# Patient Record
Sex: Male | Born: 1940 | Race: White | Hispanic: No | Marital: Married | Smoking: Never smoker
Health system: Southern US, Community
[De-identification: ages and names within clinical notes are randomized; demographics above are authoritative.]

## PROBLEM LIST (undated history)

## (undated) DIAGNOSIS — K589 Irritable bowel syndrome without diarrhea: Secondary | ICD-10-CM

## (undated) DIAGNOSIS — J8410 Pulmonary fibrosis, unspecified: Secondary | ICD-10-CM

## (undated) DIAGNOSIS — K219 Gastro-esophageal reflux disease without esophagitis: Secondary | ICD-10-CM

## (undated) DIAGNOSIS — M1990 Unspecified osteoarthritis, unspecified site: Secondary | ICD-10-CM

## (undated) DIAGNOSIS — K2960 Other gastritis without bleeding: Secondary | ICD-10-CM

## (undated) DIAGNOSIS — K449 Diaphragmatic hernia without obstruction or gangrene: Secondary | ICD-10-CM

## (undated) DIAGNOSIS — K5730 Diverticulosis of large intestine without perforation or abscess without bleeding: Secondary | ICD-10-CM

## (undated) DIAGNOSIS — K648 Other hemorrhoids: Secondary | ICD-10-CM

## (undated) DIAGNOSIS — F32A Depression, unspecified: Secondary | ICD-10-CM

## (undated) DIAGNOSIS — E785 Hyperlipidemia, unspecified: Secondary | ICD-10-CM

## (undated) DIAGNOSIS — C61 Malignant neoplasm of prostate: Secondary | ICD-10-CM

## (undated) HISTORY — DX: Other hemorrhoids: K64.8

## (undated) HISTORY — DX: Diverticulosis of large intestine without perforation or abscess without bleeding: K57.30

## (undated) HISTORY — DX: Hyperlipidemia, unspecified: E78.5

## (undated) HISTORY — PX: CARPAL TUNNEL RELEASE: SHX101

## (undated) HISTORY — DX: Diaphragmatic hernia without obstruction or gangrene: K44.9

## (undated) HISTORY — PX: NASAL SINUS SURGERY: SHX719

## (undated) HISTORY — DX: Gastro-esophageal reflux disease without esophagitis: K21.9

## (undated) HISTORY — DX: Other gastritis without bleeding: K29.60

## (undated) HISTORY — DX: Irritable bowel syndrome, unspecified: K58.9

## (undated) HISTORY — PX: LUMBAR DISC SURGERY: SHX700

---

## 1998-12-27 ENCOUNTER — Ambulatory Visit (HOSPITAL_BASED_OUTPATIENT_CLINIC_OR_DEPARTMENT_OTHER): Admission: RE | Admit: 1998-12-27 | Discharge: 1998-12-27 | Payer: Self-pay | Admitting: *Deleted

## 2000-05-12 ENCOUNTER — Encounter: Payer: Self-pay | Admitting: *Deleted

## 2000-05-12 ENCOUNTER — Encounter: Admission: RE | Admit: 2000-05-12 | Discharge: 2000-05-12 | Payer: Self-pay | Admitting: *Deleted

## 2003-11-30 ENCOUNTER — Ambulatory Visit (HOSPITAL_COMMUNITY): Admission: RE | Admit: 2003-11-30 | Discharge: 2003-11-30 | Payer: Self-pay | Admitting: Gastroenterology

## 2004-05-14 ENCOUNTER — Encounter: Admission: RE | Admit: 2004-05-14 | Discharge: 2004-05-14 | Payer: Self-pay | Admitting: Radiology

## 2004-06-19 ENCOUNTER — Ambulatory Visit (HOSPITAL_BASED_OUTPATIENT_CLINIC_OR_DEPARTMENT_OTHER): Admission: RE | Admit: 2004-06-19 | Discharge: 2004-06-19 | Payer: Self-pay | Admitting: Orthopedic Surgery

## 2008-03-15 ENCOUNTER — Encounter: Admission: RE | Admit: 2008-03-15 | Discharge: 2008-03-15 | Payer: Self-pay | Admitting: Neurological Surgery

## 2008-04-10 ENCOUNTER — Encounter: Admission: RE | Admit: 2008-04-10 | Discharge: 2008-04-10 | Payer: Self-pay | Admitting: Neurosurgery

## 2008-05-11 ENCOUNTER — Encounter: Admission: RE | Admit: 2008-05-11 | Discharge: 2008-05-11 | Payer: Self-pay | Admitting: Neurosurgery

## 2008-08-14 ENCOUNTER — Encounter: Admission: RE | Admit: 2008-08-14 | Discharge: 2008-08-14 | Payer: Self-pay | Admitting: Neurosurgery

## 2008-11-06 ENCOUNTER — Encounter: Admission: RE | Admit: 2008-11-06 | Discharge: 2008-11-06 | Payer: Self-pay | Admitting: Neurosurgery

## 2008-12-28 ENCOUNTER — Ambulatory Visit (HOSPITAL_COMMUNITY): Admission: RE | Admit: 2008-12-28 | Discharge: 2008-12-28 | Payer: Self-pay | Admitting: Orthopedic Surgery

## 2009-02-13 ENCOUNTER — Encounter: Admission: RE | Admit: 2009-02-13 | Discharge: 2009-02-13 | Payer: Self-pay | Admitting: Neurosurgery

## 2009-06-13 ENCOUNTER — Encounter: Admission: RE | Admit: 2009-06-13 | Discharge: 2009-06-13 | Payer: Self-pay | Admitting: Neurosurgery

## 2010-01-08 ENCOUNTER — Ambulatory Visit (HOSPITAL_COMMUNITY): Admission: RE | Admit: 2010-01-08 | Discharge: 2010-01-08 | Payer: Self-pay | Admitting: Internal Medicine

## 2010-01-10 ENCOUNTER — Encounter: Admission: RE | Admit: 2010-01-10 | Discharge: 2010-01-10 | Payer: Self-pay | Admitting: Neurosurgery

## 2010-06-12 ENCOUNTER — Encounter
Admission: RE | Admit: 2010-06-12 | Discharge: 2010-06-12 | Payer: Self-pay | Source: Home / Self Care | Attending: Orthopedic Surgery | Admitting: Orthopedic Surgery

## 2010-07-27 ENCOUNTER — Encounter: Payer: Self-pay | Admitting: Radiology

## 2010-11-21 NOTE — Op Note (Signed)
NAMEDERAK, SCHURMAN                  ACCOUNT NO.:  [id number]   MEDICAL RECORD NO.:  [id number]          PATIENT TYPE:  AMB   LOCATION:  [facility]                         FACILITY:  [facility]   PHYSICIAN:  Pietro Cassis. Alvan Dame, M.D.  DATE OF BIRTH:  05-29-1941   DATE OF PROCEDURE:  06/19/2004  DATE OF DISCHARGE:                                 OPERATIVE REPORT   PREOPERATIVE DIAGNOSIS:  Right knee medial meniscal tear, posterior horn.   POSTOPERATIVE DIAGNOSES/FINDINGS:  1.  Intact patellofemoral compartment with some mild chondromalacia noted on      the proximal aspect of the medial patellar facet.  2.  Hypertrophic and abundant inflamed synovitis present throughout the knee      as a result of his injury, otherwise intact patellofemoral contact zone.  3.  Intact anterior cruciate ligament, intact lateral compartment.  4.  Intact medial femoral and tibial cartilage surfaces with no significant      chondromalacia noted.  5.  Most significant complex radial type tear to the posterior horn of the      medial meniscus involving basically only the posterior horn.  There was      radial tear truncating the posterior horn of the medial meniscus with a      flap tear that extruded medially into the central portion of the joint.   PROCEDURE:  Right knee diagnostic and operative arthroscopy with posterior  horn and partial medial meniscectomy, removing about 10% of that meniscus,  which would include the posterior horn with a stable rim remaining.   SURGEON:  Pietro Cassis. Alvan Dame, M.D.   ASSISTANT:  None.   ANESTHESIA:  Local plus MAC.   COMPLICATIONS:  None apparent.   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  He had mechanical-type symptoms that were  associated with medial joint line tenderness.  An MRI had been performed  followed by a cortisone injection in the  right knee.  The MRI revealed this  posterior horn medial meniscal tear associated with very minimal  osteoarthritis.  He had a cortisone injection which failed to provide  adequate long-term relief.   After reviewing these findings with him, including a response to  conservative measures, we discussed management, including nonoperative  measures versus surgically debriding this posterior horn of the meniscus to  prevent further complications and propagation of a tear.  After reviewing  risks and benefits, he consents for the procedure.   PROCEDURE IN DETAIL:  Patient was brought to the operating theater.  Once  adequate anesthesia and preoperative antibiotics were administered, the  patient was positioned supine with the right leg in a leg holder.  The right  lower extremity was then prepped and draped in a sterile fashion.  A  standard inferolateral, superolateral, and inferior medial portals were  utilized.  Diagnostic evaluation of the knee was carried out, revealing  hypertrophic and hyperemic synovitis throughout the knee.  The proximal  aspect of the  medial patellar facet was noted to have some chondromalacia  but not terribly significant, nothing that required debridement.  There were  no loose fragments or cartilage floating in the knee.  Further examination  of the knee revealed the above-noted findings but otherwise a very well-  appearing knee.  The inferior medial portal was utilized as a working  portal.  With exposure to the posterior horn of the meniscus, a biting small  basket was utilized to debride the flap tear portion.  There was a radial  type of tear that basically isolated this portion of the posterior horn from  the remaining meniscus.  There was a slight communication of the  continuation of that radial tear, which was debrided using a biting basket.  Only approximately about 10-15% of the meniscus was removed off the  posterior horn of the meniscus, leaving the  posterior horn with a stable rim  that then blended in nicely with the remaining medial meniscus, posterior  horn, mid body, etc.  I did enter the back of the knee through the medial  aspect of the notch to allow for visualization of this posterior horn tear.  This did allow great visualization of this flap tear and allowed for further  debridement using the biting baskets followed by the shaver.  Following the  debridement, the knee was examined, showing there were no further loose  fragments of cartilage floating around.  The knee was then irrigated and  copiously was suctioned and drained with a 3.5 shaver.  Instrumentation was  removed.  Portals were reapproximated using 3-0 nylon.  The knee was then  cleaned, dried, and dressed sterilely with a bulky Jones dressing.  The  patient was transferred to the recovery room in stable condition.   The postoperative plan would call for the patient to return in 10 days.  He  will be weightbearing as tolerated, although with instructions to modify  activities for a while.  Medications as prescribed.  Knee range of motion,  activities, and quad sets will be permitted once pain has improved.     Matt   MDO/MEDQ  D:  06/19/2004  T:  06/19/2004  Job:  [id number]

## 2010-11-27 ENCOUNTER — Other Ambulatory Visit: Payer: Self-pay

## 2010-11-27 ENCOUNTER — Other Ambulatory Visit: Payer: Self-pay | Admitting: Orthopedic Surgery

## 2010-11-27 DIAGNOSIS — G8929 Other chronic pain: Secondary | ICD-10-CM

## 2010-11-28 ENCOUNTER — Other Ambulatory Visit: Payer: Self-pay | Admitting: Orthopedic Surgery

## 2010-11-28 ENCOUNTER — Ambulatory Visit
Admission: RE | Admit: 2010-11-28 | Discharge: 2010-11-28 | Disposition: A | Discharge status: Home / Self Care | Payer: Medicare Other | Source: Ambulatory Visit | Attending: Orthopedic Surgery | Admitting: Orthopedic Surgery

## 2010-11-28 DIAGNOSIS — G8929 Other chronic pain: Secondary | ICD-10-CM

## 2010-11-28 DIAGNOSIS — M533 Sacrococcygeal disorders, not elsewhere classified: Secondary | ICD-10-CM

## 2011-04-08 ENCOUNTER — Other Ambulatory Visit (HOSPITAL_COMMUNITY): Payer: Self-pay | Admitting: Internal Medicine

## 2011-04-08 ENCOUNTER — Ambulatory Visit (HOSPITAL_COMMUNITY)
Admission: RE | Admit: 2011-04-08 | Discharge: 2011-04-08 | Disposition: A | Discharge status: Home / Self Care | Payer: Medicare Other | Source: Ambulatory Visit | Attending: Internal Medicine | Admitting: Internal Medicine

## 2011-04-08 DIAGNOSIS — Z0000 Encounter for general adult medical examination without abnormal findings: Secondary | ICD-10-CM

## 2011-05-05 ENCOUNTER — Ambulatory Visit (HOSPITAL_BASED_OUTPATIENT_CLINIC_OR_DEPARTMENT_OTHER)
Admission: RE | Admit: 2011-05-05 | Discharge: 2011-05-05 | Disposition: A | Discharge status: Home / Self Care | Payer: Medicare Other | Source: Ambulatory Visit | Attending: Orthopedic Surgery | Admitting: Orthopedic Surgery

## 2011-05-05 DIAGNOSIS — G5600 Carpal tunnel syndrome, unspecified upper limb: Secondary | ICD-10-CM | POA: Insufficient documentation

## 2011-05-05 DIAGNOSIS — M65839 Other synovitis and tenosynovitis, unspecified forearm: Secondary | ICD-10-CM | POA: Insufficient documentation

## 2011-05-05 DIAGNOSIS — M65849 Other synovitis and tenosynovitis, unspecified hand: Secondary | ICD-10-CM | POA: Insufficient documentation

## 2011-05-06 LAB — POCT HEMOGLOBIN-HEMACUE: Hemoglobin: 12.7 g/dL — ABNORMAL LOW (ref 13.0–17.0)

## 2011-05-08 NOTE — Op Note (Addendum)
NAMELARENZO, CAPLES NO.:  [id number]  MEDICAL RECORD NO.:  [id number]  LOCATION:  XRAY                         FACILITY:  [facility]  PHYSICIAN:  Daryll Brod, M.D.       DATE OF BIRTH:  1941/07/02  DATE OF PROCEDURE:  05/05/2011 DATE OF DISCHARGE:  04/08/2011                              OPERATIVE REPORT   PREOPERATIVE DIAGNOSES:  Carpal tunnel syndrome, left hand.  Stenosing tenosynovitis, left ring finger.  POSTOPERATIVE DIAGNOSES:  Carpal tunnel syndrome, left hand. Stenosing tenosynovitis, left ring finger.  OPERATION:  Release A1 pulley, left ring finger.  Release of median nerve, left wrist.  SURGEON:  Daryll Brod, M.D.  ASSISTANT:  Leanora Cover, MD  ANESTHESIA:  Forearm-based IV regional with local infiltration.  ANESTHESIOLOGIST:  Jessy Oto. Albertina Parr, M.D.  HISTORY:  The patient is a 70 year old male who has a history of numbness and tingling of his hand, triggering of his left ring finger. EMG nerve conductions are positive for carpal tunnel syndrome.  This has not responded to conservative treatment.  He has elected to undergo surgical decompression.  Pre, peri, postoperative course has been discussed along with risks and complications.  He is aware that there is no guarantee with the surgery, possibility of infection, recurrence, injury to arteries, nerves, tendons, incomplete relief of symptoms, and dystrophy.  In the preoperative area, the patient is seen, the extremity marked by both the patient and surgeon.  Antibiotic given.  PROCEDURE:  The patient was brought to the operating room where forearm- based IV regional anesthetic was carried out without difficulty.  He was prepped using ChloraPrep, supine position, left arm free.  A 3-minute dry time was allowed, time-out taken, confirming the patient and procedure.  A longitudinal incision was made in the palm, carried down through subcutaneous tissue.  Bleeders were electrocauterized  with bipolar.  Dissection carried down identifying flexor tendon to the ring little finger.  The superficial palmar arch identified.  Retractors placed retracting the flexors to the radial side.  The nerve and artery to the ulnar side, and incision made through the carpal retinaculum. Right angle and Sewall retractor were placed between skin and forearm fascia.  The fascia released for approximately a centimeter and half proximal to the wrist crease under direct vision.  Air compression to the nerve was apparent.  No further lesions were identified. Tenosynovial tissue was moderately thickened.  The wound was irrigated and closed with interrupted 4-0 Vicryl Rapide sutures.  Separate incision was then made obliquely over the A1 pulley left ring finger carried down through subcutaneous tissue.  Bleeders again electrocauterized with bipolar.  The A1 pulley was identified.  This was released on its radial aspect to the partial tenosynovectomy proximally. A small incision made centrally in A2.  The finger placed through full range of motion, no further triggering was noted.  The wound was irrigated and closed with interrupted 4-0 Vicryl Rapide sutures.  A local infiltration with 0.25% Marcaine without epinephrine was given to each wound approximately 8 mL was used.  Sterile compressive dressing was applied to the wrist with the fingers free.  On deflation of the tourniquet, all fingers immediately  pinked.  He was taken to the recovery room for observation in satisfactory condition.  He will be discharged home to return the [place] in 1 week on Vicodin.          ______________________________ Daryll Brod, M.D.     GK/MEDQ  D:  05/05/2011  T:  05/05/2011  Job:  [id number]  Electronically Signed by Daryll Brod M.D. on 05/10/2011 07:27:11 AM

## 2011-05-22 ENCOUNTER — Telehealth: Payer: Self-pay | Admitting: *Deleted

## 2011-05-22 NOTE — Telephone Encounter (Signed)
Schedule pt for a Direct Colon. Spoke with pt who asked that I call him next week to schedule- will call Tuesday.

## 2011-06-03 NOTE — Telephone Encounter (Signed)
Dr Annamaria Boots will call me back to schedule his Direct COLON.

## 2011-06-06 HISTORY — PX: CATARACT EXTRACTION: SUR2

## 2011-06-17 ENCOUNTER — Telehealth: Payer: Self-pay | Admitting: *Deleted

## 2011-06-17 NOTE — Telephone Encounter (Signed)
Pt never called back to schedule.

## 2011-07-10 ENCOUNTER — Other Ambulatory Visit: Payer: Self-pay | Admitting: Orthopedic Surgery

## 2011-07-10 DIAGNOSIS — M549 Dorsalgia, unspecified: Secondary | ICD-10-CM

## 2011-07-13 ENCOUNTER — Other Ambulatory Visit: Payer: Self-pay | Admitting: Orthopedic Surgery

## 2011-07-13 ENCOUNTER — Ambulatory Visit
Admission: RE | Admit: 2011-07-13 | Discharge: 2011-07-13 | Disposition: A | Discharge status: Home / Self Care | Payer: Medicare Other | Source: Ambulatory Visit | Attending: Orthopedic Surgery | Admitting: Orthopedic Surgery

## 2011-07-13 DIAGNOSIS — M542 Cervicalgia: Secondary | ICD-10-CM

## 2011-07-13 DIAGNOSIS — M549 Dorsalgia, unspecified: Secondary | ICD-10-CM

## 2011-07-13 MED ORDER — IOHEXOL 180 MG/ML  SOLN
1.0000 mL | Freq: Once | INTRAMUSCULAR | Status: AC | PRN
  Administered 2011-07-13: 1 mL via EPIDURAL

## 2011-07-13 MED ORDER — METHYLPREDNISOLONE ACETATE 40 MG/ML INJ SUSP (RADIOLOG
120.0000 mg | Freq: Once | INTRAMUSCULAR | Status: AC
  Administered 2011-07-13: 120 mg via EPIDURAL

## 2011-12-07 ENCOUNTER — Encounter: Payer: Self-pay | Admitting: *Deleted

## 2011-12-07 ENCOUNTER — Telehealth: Payer: Self-pay | Admitting: Gastroenterology

## 2011-12-07 NOTE — Telephone Encounter (Signed)
Pt would like to schedule a COLON, but he also wants to speak with Dr Sharlett Iles. I tried to find an appt for next week, but Dr Sharlett Iles is out of town next week. Left a note for Dr Sharlett Iles to call pt.

## 2011-12-08 ENCOUNTER — Encounter: Payer: Self-pay | Admitting: Gastroenterology

## 2011-12-08 ENCOUNTER — Other Ambulatory Visit (INDEPENDENT_AMBULATORY_CARE_PROVIDER_SITE_OTHER): Payer: Medicare Other

## 2011-12-08 ENCOUNTER — Ambulatory Visit (INDEPENDENT_AMBULATORY_CARE_PROVIDER_SITE_OTHER): Payer: Medicare Other | Admitting: Gastroenterology

## 2011-12-08 VITALS — BP 110/60 | HR 80 | Ht 71.0 in | Wt 185.0 lb

## 2011-12-08 DIAGNOSIS — F418 Other specified anxiety disorders: Secondary | ICD-10-CM

## 2011-12-08 DIAGNOSIS — Z79899 Other long term (current) drug therapy: Secondary | ICD-10-CM

## 2011-12-08 DIAGNOSIS — K219 Gastro-esophageal reflux disease without esophagitis: Secondary | ICD-10-CM

## 2011-12-08 DIAGNOSIS — K529 Noninfective gastroenteritis and colitis, unspecified: Secondary | ICD-10-CM

## 2011-12-08 DIAGNOSIS — R197 Diarrhea, unspecified: Secondary | ICD-10-CM

## 2011-12-08 DIAGNOSIS — K589 Irritable bowel syndrome without diarrhea: Secondary | ICD-10-CM

## 2011-12-08 DIAGNOSIS — F341 Dysthymic disorder: Secondary | ICD-10-CM

## 2011-12-08 DIAGNOSIS — M533 Sacrococcygeal disorders, not elsewhere classified: Secondary | ICD-10-CM

## 2011-12-08 LAB — CBC WITH DIFFERENTIAL/PLATELET
Basophils Absolute: 0 10*3/uL (ref 0.0–0.1)
Basophils Relative: 0.2 % (ref 0.0–3.0)
Eosinophils Absolute: 0.1 10*3/uL (ref 0.0–0.7)
Eosinophils Relative: 2.4 % (ref 0.0–5.0)
HCT: 37.5 % — ABNORMAL LOW (ref 39.0–52.0)
Hemoglobin: 12.8 g/dL — ABNORMAL LOW (ref 13.0–17.0)
Lymphocytes Relative: 21.7 % (ref 12.0–46.0)
Lymphs Abs: 1.1 10*3/uL (ref 0.7–4.0)
MCHC: 34.2 g/dL (ref 30.0–36.0)
MCV: 94.7 fl (ref 78.0–100.0)
Monocytes Absolute: 0.5 10*3/uL (ref 0.1–1.0)
Monocytes Relative: 9.6 % (ref 3.0–12.0)
Neutro Abs: 3.4 10*3/uL (ref 1.4–7.7)
Neutrophils Relative %: 66.1 % (ref 43.0–77.0)
Platelets: 164 10*3/uL (ref 150.0–400.0)
RBC: 3.97 Mil/uL — ABNORMAL LOW (ref 4.22–5.81)
RDW: 12.6 % (ref 11.5–14.6)
WBC: 5.1 10*3/uL (ref 4.5–10.5)

## 2011-12-08 LAB — HEPATIC FUNCTION PANEL
ALT: 22 U/L (ref 0–53)
AST: 26 U/L (ref 0–37)
Albumin: 4.3 g/dL (ref 3.5–5.2)
Alkaline Phosphatase: 48 U/L (ref 39–117)
Bilirubin, Direct: 0.1 mg/dL (ref 0.0–0.3)
Total Bilirubin: 0.8 mg/dL (ref 0.3–1.2)
Total Protein: 7.1 g/dL (ref 6.0–8.3)

## 2011-12-08 LAB — BASIC METABOLIC PANEL
BUN: 15 mg/dL (ref 6–23)
CO2: 28 mEq/L (ref 19–32)
Calcium: 8.8 mg/dL (ref 8.4–10.5)
Chloride: 101 mEq/L (ref 96–112)
Creatinine, Ser: 0.6 mg/dL (ref 0.4–1.5)
GFR: 131.37 mL/min (ref >60.00)
Glucose, Bld: 76 mg/dL (ref 70–99)
Potassium: 3.9 mEq/L (ref 3.5–5.1)
Sodium: 137 mEq/L (ref 135–145)

## 2011-12-08 LAB — SEDIMENTATION RATE: Sed Rate: 7 mm/hr (ref 0–22)

## 2011-12-08 LAB — IBC PANEL
Iron: 143 ug/dL (ref 42–165)
Saturation Ratios: 39.3 % (ref 20.0–50.0)
Transferrin: 259.7 mg/dL (ref 212.0–360.0)

## 2011-12-08 LAB — AMYLASE: Amylase: 56 U/L (ref 27–131)

## 2011-12-08 LAB — LIPASE: Lipase: 19 U/L (ref 11.0–59.0)

## 2011-12-08 LAB — HIGH SENSITIVITY CRP: CRP, High Sensitivity: 0.87 mg/dL (ref 0.000–5.000)

## 2011-12-08 MED ORDER — MESALAMINE 1.2 G PO TBEC: DELAYED_RELEASE_TABLET | ORAL | Status: DC

## 2011-12-08 MED ORDER — CILIDINIUM-CHLORDIAZEPOXIDE 2.5-5 MG PO CAPS: 1.0000 | ORAL_CAPSULE | Freq: Two times a day (BID) | ORAL | Status: DC | PRN

## 2011-12-08 MED ORDER — MOVIPREP 100 G PO SOLR: 1.0000 | Freq: Once | ORAL | Status: DC

## 2011-12-08 MED ORDER — CITALOPRAM HYDROBROMIDE 20 MG PO TABS: 20.0000 mg | ORAL_TABLET | Freq: Every day | ORAL | Status: DC

## 2011-12-08 NOTE — Patient Instructions (Addendum)
Your procedure has been scheduled for 12/28/2011, please follow the seperate instructions.  Your prescription(s) have been sent to you pharmacy, Librax, Movi Prep, Celexa Take Librax as needed Take Celexa one tablet by mouth at bedtime.  Take Lialda two tablets by mouth every morning, samples given  Please go to the basement today for your labs.

## 2011-12-08 NOTE — Progress Notes (Signed)
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 This patient has been in fairly good health all of his life without serious medical or surgical issues. He gives a history of many years of irritable bowel syndrome type abdominal discomfort, gas, bloating, and chronic nonbloody diarrhea. He had a negative colonoscopy in 2003 except for internal hemorrhoids. Endoscopy in 2005 by Dr.[place] was unremarkable including exam for H. pylori. The patient has had chronic acid reflux and has taken Nexium 40 mg for several years. Currently, he denies upper gastrointestinal or hepatobiliary complaints. His diarrhea seems to be stress related, and he recently has had increased anxiety and depression which he seems to internalize. He denies any specific food intolerances, use of sorbitol or fructose. He does have chronic sinusitis and takes frequent antibiotics, but also takes probiotics. His family history is remarkable for chronic diarrhea in his father without a definite diagnosis. The patient also complains of chronic sacroiliitis and musculoskeletal pains in his legs relieved by small doses of soma, Robaxin, and Anaprox. He uses when necessary Lomotil when traveling or during periods of excessive eating. He has no symptoms of malabsorption, unintended weight loss, fatty stools, or nocturnal awakening. His depression is minor, and he denies sleep disturbances or any other psychiatric issues. He is concerned about possible" addiction" to soma. Dr. Annamaria Boots has sacroiliac steroid injection every several months per radiology. Other systemic complaints include night sweats and fatigue. He denies dysphagia, hepatobiliary complaints, or history of hepatitis or pancreatitis. He uses ethanol socially, has no history of excessive alcohol use, and does not smoke.  I have reviewed this patient's present history, medical and  surgical past history, allergies and medications.     ROS: The remainder of the 10 point ROS is negative... history of lumbosacral disc surgery 1986 in 1995. No current cardiovascular, pulmonary, other genitourinary or neurological problems.  No Known Allergies No outpatient prescriptions prior to visit.   Past Medical History  Diagnosis Date  . Other specified gastritis without mention of hemorrhage   . Hiatal hernia   . Internal hemorrhoids without mention of complication   . Irritable bowel syndrome   . Diverticulosis of colon (without mention of hemorrhage)   . GERD (gastroesophageal reflux disease)   . HLD (hyperlipidemia)    Past Surgical History  Procedure Date  . Lumbar disc surgery 1986, 1995    x 2  . Carpal tunnel release     left  . Cataract extraction 06/2011    bilateral   History   Social History  . Marital Status: Married    Spouse Name: N/A    Number of Children: 2  . Years of Education: N/A   Occupational History  . PHYCISIAN    Social History Main Topics  . Smoking status: Never Smoker   . Smokeless tobacco: Never Used  . Alcohol Use: Yes     6 per week  . Drug Use: No  . Sexually Active: None   Other Topics Concern  . None   Social History Narrative  . None   Family History  Problem Relation Age of Onset  . Heart disease Father         Physical Exam: Blood pressure 110/60, pulse 60 and regular, weight 185 pounds the BMI of 25.8. General well developed well nourished patient in no acute distress, appearing their stated age Eyes PERRLA, no icterus, fundoscopic exam per opthamologist Skin no lesions  noted Neck supple, no adenopathy, no thyroid enlargement, no tenderness Chest clear to percussion and auscultation Heart no significant murmurs, gallops or rubs noted Abdomen no hepatosplenomegaly masses or tenderness, BS normal.  Rectal inspection normal no fissures, or fistulae noted.  No masses or tenderness on digital exam. Stool  guaiac negative. Extremities no acute joint lesions, edema, phlebitis or evidence of cellulitis. Neurologic patient oriented x 3, cranial nerves intact, no focal neurologic deficits noted. Psychological mental status normal and normal affect.  Assessment and plan: Probable chronic irritable bowel syndrome exacerbated bya recent anxiety and depression. We need to exclude underlying inflammatory bowel disease with associated sacroiliitis and musculoskeletal pain. I have ordered screening laboratory parameters, colonoscopy exam, and have started him on Lialda 2.4 g a day, Librax every 6-8 hours as needed, Celexa 20 mg a day at bedtime, and we also will do her followup endoscopy because of his chronic acid reflux. At that time I will obtain small bowel biopsy. Celiac serologies also ordered for review along with CRP and sedimentation rate. 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  Encounter Diagnoses  Name Primary?  . Esophageal reflux Yes  . Irritable bowel syndrome

## 2011-12-09 ENCOUNTER — Encounter: Payer: Self-pay | Admitting: Gastroenterology

## 2011-12-09 LAB — CELIAC PANEL 10
Endomysial Screen: NEGATIVE
Gliadin IgA: 1.8 U/mL (ref <20)
Gliadin IgG: 4.1 U/mL (ref <20)
IgA: 132 mg/dL (ref 68–379)
Tissue Transglut Ab: 3.9 U/mL (ref <20)
Tissue Transglutaminase Ab, IgA: 2.2 U/mL (ref <20)

## 2011-12-09 LAB — VITAMIN B12: Vitamin B-12: 257 pg/mL (ref 211–911)

## 2011-12-09 LAB — FOLATE: Folate: 16.2 ng/mL (ref >5.9)

## 2011-12-09 LAB — TSH: TSH: 1.86 u[IU]/mL (ref 0.35–5.50)

## 2011-12-09 LAB — FERRITIN: Ferritin: 81.7 ng/mL (ref 22.0–322.0)

## 2011-12-10 ENCOUNTER — Other Ambulatory Visit: Payer: Self-pay | Admitting: Orthopedic Surgery

## 2011-12-10 DIAGNOSIS — M549 Dorsalgia, unspecified: Secondary | ICD-10-CM

## 2011-12-15 ENCOUNTER — Ambulatory Visit
Admission: RE | Admit: 2011-12-15 | Discharge: 2011-12-15 | Disposition: A | Discharge status: Home / Self Care | Payer: Medicare Other | Source: Ambulatory Visit | Attending: Orthopedic Surgery | Admitting: Orthopedic Surgery

## 2011-12-15 ENCOUNTER — Other Ambulatory Visit: Payer: Self-pay | Admitting: Orthopedic Surgery

## 2011-12-15 DIAGNOSIS — M549 Dorsalgia, unspecified: Secondary | ICD-10-CM

## 2011-12-15 MED ORDER — IOHEXOL 180 MG/ML  SOLN
1.0000 mL | Freq: Once | INTRAMUSCULAR | Status: AC | PRN
  Administered 2011-12-15: 1 mL via EPIDURAL

## 2011-12-15 MED ORDER — METHYLPREDNISOLONE ACETATE 40 MG/ML INJ SUSP (RADIOLOG
120.0000 mg | Freq: Once | INTRAMUSCULAR | Status: AC
  Administered 2011-12-15: 120 mg via EPIDURAL

## 2011-12-28 ENCOUNTER — Ambulatory Visit (AMBULATORY_SURGERY_CENTER): Payer: Medicare Other | Admitting: Gastroenterology

## 2011-12-28 ENCOUNTER — Encounter: Payer: Self-pay | Admitting: Gastroenterology

## 2011-12-28 VITALS — BP 127/78 | HR 80 | Temp 96.5°F | Resp 23 | Ht 71.0 in | Wt 185.0 lb

## 2011-12-28 DIAGNOSIS — Z1211 Encounter for screening for malignant neoplasm of colon: Secondary | ICD-10-CM

## 2011-12-28 DIAGNOSIS — K589 Irritable bowel syndrome without diarrhea: Secondary | ICD-10-CM

## 2011-12-28 DIAGNOSIS — K219 Gastro-esophageal reflux disease without esophagitis: Secondary | ICD-10-CM

## 2011-12-28 DIAGNOSIS — D1330 Benign neoplasm of unspecified part of small intestine: Secondary | ICD-10-CM

## 2011-12-28 MED ORDER — SODIUM CHLORIDE 0.9 % IV SOLN: 500.0000 mL | INTRAVENOUS | Status: DC

## 2011-12-28 NOTE — Patient Instructions (Addendum)
YOU HAD AN ENDOSCOPIC PROCEDURE TODAY AT THE [facility]: Refer to the procedure report that was given to you for any specific questions about what was found during the examination.  If the procedure report does not answer your questions, please call your gastroenterologist to clarify.  If you requested that your care partner not be given the details of your procedure findings, then the procedure report has been included in a sealed envelope for you to review at your convenience later.  YOU SHOULD EXPECT: Some feelings of bloating in the abdomen. Passage of more gas than usual.  Walking can help get rid of the air that was put into your GI tract during the procedure and reduce the bloating. If you had a lower endoscopy (such as a colonoscopy or flexible sigmoidoscopy) you may notice spotting of blood in your stool or on the toilet paper. If you underwent a bowel prep for your procedure, then you may not have a normal bowel movement for a few days.  DIET: Your first meal following the procedure should be a light meal and then it is ok to progress to your normal diet.  A half-sandwich or bowl of soup is an example of a good first meal.  Heavy or fried foods are harder to digest and may make you feel nauseous or bloated.  Likewise meals heavy in dairy and vegetables can cause extra gas to form and this can also increase the bloating.  Drink plenty of fluids but you should avoid alcoholic beverages for 24 hours.  ACTIVITY: Your care partner should take you home directly after the procedure.  You should plan to take it easy, moving slowly for the rest of the day.  You can resume normal activity the day after the procedure however you should NOT DRIVE or use heavy machinery for 24 hours (because of the sedation medicines used during the test).    SYMPTOMS TO REPORT IMMEDIATELY: A gastroenterologist can be reached at any hour.  During normal business hours, 8:30 AM to 5:00 PM Monday through Friday,  call [phone].  After hours and on weekends, please call the GI answering service at [phone] who will take a message and have the physician on call contact you.   Following lower endoscopy (colonoscopy or flexible sigmoidoscopy):  Excessive amounts of blood in the stool  Significant tenderness or worsening of abdominal pains  Swelling of the abdomen that is new, acute  Fever of 100F or higher  Following upper endoscopy (EGD)  Vomiting of blood or coffee ground material  New chest pain or pain under the shoulder blades  Painful or persistently difficult swallowing  New shortness of breath  Fever of 100F or higher  Black, tarry-looking stools  FOLLOW UP: If any biopsies were taken you will be contacted by phone or by letter within the next 1-3 weeks.  Call your gastroenterologist if you have not heard about the biopsies in 3 weeks.  Our staff will call the home number listed on your records the next business day following your procedure to check on you and address any questions or concerns that you may have at that time regarding the information given to you following your procedure. This is a courtesy call and so if there is no answer at the home number and we have not heard from you through the emergency physician on call, we will assume that you have returned to your regular daily activities without incident.  SIGNATURES/CONFIDENTIALITY: You and/or your care   partner have signed paperwork which will be entered into your electronic medical record.  These signatures attest to the fact that that the information above on your After Visit Summary has been reviewed and is understood.  Full responsibility of the confidentiality of this discharge information lies with you and/or your care-partner.    Information on diverticulosis & high fiber diet given to you today  Information on gerd & hiatal hernia given to you today   Per Dr Sharlett Iles , may take Lialda, Librax, and Nexium  As  needed

## 2011-12-28 NOTE — Progress Notes (Signed)
The pt tolerated the egd very well. Maw

## 2011-12-28 NOTE — Op Note (Signed)
[facility] [address] [facility] [address]. [place], [place]  [id number]  ENDOSCOPY PROCEDURE REPORT  PATIENT:  Kenneth, [name]  MR#:  [id number] BIRTHDATE:  10-Apr-1941, 62 yrs. old  GENDER:  male  ENDOSCOPIST:  Loralee Pacas. Sharlett Iles, MD, [facility] Referred by:  PROCEDURE DATE:  12/28/2011 PROCEDURE:  EGD with biopsy, 43239, EGD with biopsy for H. pylori 43239 ASA CLASS:  Class II INDICATIONS:  dyspepsia, GERD  MEDICATIONS:   There was residual sedation effect present from prior procedure., propofol (Diprivan) 100 mg IV TOPICAL ANESTHETIC:  DESCRIPTION OF PROCEDURE:   After the risks and benefits of the procedure were explained, informed consent was obtained.  The LB GIF-H180 [id number] endoscope was introduced through the mouth and advanced to the second portion of the duodenum.  The instrument was slowly withdrawn as the mucosa was fully examined. <<PROCEDUREIMAGES>>  A hiatal hernia was found. SMALL 2 CM. [place] NOTED  The upper, middle, and distal third of the esophagus were carefully inspected and no abnormalities were noted. The z-line was well seen at the GEJ. The endoscope was pushed into the fundus which was normal including a retroflexed view. The antrum,gastric body, first and second part of the duodenum were unremarkable. DUODENAL AND CLO BX. DONE,,,,    Retroflexed views revealed no abnormalities. The scope was then withdrawn from the patient and the procedure completed.  COMPLICATIONS:  None  ENDOSCOPIC IMPRESSION: 1) Hiatal hernia 2) Normal EGD TREATED GERD,NO BARRETT'S NOTED. RECOMMENDATIONS: 1) Await biopsy results 2) continue PPI  ______________________________ Loralee Pacas. Sharlett Iles, MD, [facility]  CC:  [name], MD  n. Lorrin MaisMarland Kitchen   Loralee Pacas. Geneive Sandstrom at 12/28/2011 02:43 PM  Helmut Muster, [phone]

## 2011-12-28 NOTE — Progress Notes (Signed)
14:38 Levin Erp, CRNA hung 2nd bag of normal saline 0.9% 500 ml. Maw

## 2011-12-28 NOTE — Progress Notes (Signed)
Patient did not experience any of the following events: a burn prior to discharge; a fall within the facility; wrong site/side/patient/procedure/implant event; or a hospital transfer or hospital admission upon discharge from the facility. (G8907) Patient did not have preoperative order for IV antibiotic SSI prophylaxis. (G8918)  

## 2011-12-28 NOTE — Progress Notes (Signed)
The pt tolerated the colonoscopy very well. Maw

## 2011-12-28 NOTE — Op Note (Signed)
[facility] [address] [facility] [address]. [place], [place]  [id number]  COLONOSCOPY PROCEDURE REPORT  PATIENT:  [name], Kenneth  MR#:  [id number] BIRTHDATE:  Apr 30, 1941, 68 yrs. old  GENDER:  male ENDOSCOPIST:  Loralee Pacas. Sharlett Iles, MD, [facility] REF. BY: PROCEDURE DATE:  12/28/2011 PROCEDURE:  Average-risk screening colonoscopy G0121 ASA CLASS:  Class II INDICATIONS:  Routine Risk Screening IBS.?? IBD MEDICATIONS:   propofol (Diprivan) 300 mg IV  DESCRIPTION OF PROCEDURE:   After the risks and benefits and of the procedure were explained, informed consent was obtained. Digital rectal exam was performed and revealed no abnormalities. The LB CF-H180AL [id number] endoscope was introduced through the anus and advanced to the cecum, which was identified by both the appendix and ileocecal valve.  The quality of the prep was excellent, using MoviPrep.  The instrument was then slowly withdrawn as the colon was fully examined. <<PROCEDUREIMAGES>>  FINDINGS:  There were mild diverticular changes in left colon. diverticulosis was found.  No polyps or cancers were seen.  This was otherwise a normal examination of the colon.   Retroflexed views in the rectum revealed no abnormalities.    The scope was then withdrawn from the patient and the procedure completed.  COMPLICATIONS:  None ENDOSCOPIC IMPRESSION: 1) Diverticulosis,mild,left sided diverticulosis 2) No polyps or cancers 3) Otherwise normal examination NO EVIDENCE OF IBD,SYMPTOMS C/W IRRITABLE BOWEL SYNDROME. RECOMMENDATIONS: 1) Continue current medications 2) Repeat Colonscopy in 10 years.  REPEAT EXAM:  No  ______________________________ Loralee Pacas. Sharlett Iles, MD, [facility]  CC:  [name], MD  n. Lorrin MaisMarland Kitchen   Loralee Pacas. Aleida Crandell at 12/28/2011 02:38 PM  Helmut Muster, [phone]

## 2011-12-29 ENCOUNTER — Telehealth: Payer: Self-pay

## 2011-12-29 NOTE — Telephone Encounter (Signed)
  Follow up Call-  Call back number 12/28/2011  Post procedure Call Back phone  # [phone]  Permission to leave phone message Yes     Patient questions:  Do you have a fever, pain , or abdominal swelling? no Pain Score  0 *  Have you tolerated food without any problems? yes  Have you been able to return to your normal activities? yes  Do you have any questions about your discharge instructions: Diet   no Medications  no Follow up visit  no  Do you have questions or concerns about your Care? no  Actions: * If pain score is 4 or above: No action needed, pain <4.  Per the pt, "everyone from the beginning to the end were so nice.  You all do a good job over there". Maw

## 2011-12-30 LAB — HELICOBACTER PYLORI SCREEN-BIOPSY: UREASE: NEGATIVE

## 2011-12-30 NOTE — Addendum Note (Signed)
Addended by: Ernestine Conrad D on: 12/30/2011 01:56 PM   Modules accepted: Orders

## 2011-12-31 ENCOUNTER — Encounter: Payer: Self-pay | Admitting: Gastroenterology

## 2012-05-23 ENCOUNTER — Other Ambulatory Visit: Payer: Self-pay | Admitting: Orthopedic Surgery

## 2012-05-23 DIAGNOSIS — M5410 Radiculopathy, site unspecified: Secondary | ICD-10-CM

## 2012-05-25 ENCOUNTER — Ambulatory Visit
Admission: RE | Admit: 2012-05-25 | Discharge: 2012-05-25 | Disposition: A | Discharge status: Home / Self Care | Payer: Medicare Other | Source: Ambulatory Visit | Attending: Orthopedic Surgery | Admitting: Orthopedic Surgery

## 2012-05-25 DIAGNOSIS — M5410 Radiculopathy, site unspecified: Secondary | ICD-10-CM

## 2012-05-25 MED ORDER — IOHEXOL 180 MG/ML  SOLN
1.0000 mL | Freq: Once | INTRAMUSCULAR | Status: AC | PRN
  Administered 2012-05-25: 1 mL via EPIDURAL

## 2012-05-25 MED ORDER — METHYLPREDNISOLONE ACETATE 40 MG/ML INJ SUSP (RADIOLOG
120.0000 mg | Freq: Once | INTRAMUSCULAR | Status: AC
  Administered 2012-05-25: 120 mg via EPIDURAL

## 2012-06-16 ENCOUNTER — Other Ambulatory Visit: Payer: Self-pay | Admitting: Orthopedic Surgery

## 2012-06-16 DIAGNOSIS — M549 Dorsalgia, unspecified: Secondary | ICD-10-CM

## 2012-06-17 ENCOUNTER — Ambulatory Visit
Admission: RE | Admit: 2012-06-17 | Discharge: 2012-06-17 | Disposition: A | Discharge status: Home / Self Care | Payer: Medicare Other | Source: Ambulatory Visit | Attending: Orthopedic Surgery | Admitting: Orthopedic Surgery

## 2012-06-17 DIAGNOSIS — M549 Dorsalgia, unspecified: Secondary | ICD-10-CM

## 2012-06-17 MED ORDER — IOHEXOL 180 MG/ML  SOLN
1.0000 mL | Freq: Once | INTRAMUSCULAR | Status: AC | PRN
  Administered 2012-06-17: 1 mL via EPIDURAL

## 2012-06-17 MED ORDER — METHYLPREDNISOLONE ACETATE 40 MG/ML INJ SUSP (RADIOLOG
120.0000 mg | Freq: Once | INTRAMUSCULAR | Status: AC
  Administered 2012-06-17: 120 mg via EPIDURAL

## 2012-09-28 ENCOUNTER — Other Ambulatory Visit: Payer: Self-pay | Admitting: Dermatology

## 2012-11-09 ENCOUNTER — Other Ambulatory Visit: Payer: Self-pay | Admitting: Gastroenterology

## 2012-11-11 ENCOUNTER — Other Ambulatory Visit: Payer: Self-pay | Admitting: Orthopedic Surgery

## 2012-11-11 DIAGNOSIS — M549 Dorsalgia, unspecified: Secondary | ICD-10-CM

## 2012-11-16 ENCOUNTER — Other Ambulatory Visit: Payer: Self-pay | Admitting: Orthopedic Surgery

## 2012-11-16 ENCOUNTER — Ambulatory Visit
Admission: RE | Admit: 2012-11-16 | Discharge: 2012-11-16 | Disposition: A | Discharge status: Home / Self Care | Payer: Medicare Other | Source: Ambulatory Visit | Attending: Orthopedic Surgery | Admitting: Orthopedic Surgery

## 2012-11-16 DIAGNOSIS — M549 Dorsalgia, unspecified: Secondary | ICD-10-CM

## 2012-11-16 MED ORDER — METHYLPREDNISOLONE ACETATE 40 MG/ML INJ SUSP (RADIOLOG
120.0000 mg | Freq: Once | INTRAMUSCULAR | Status: AC
  Administered 2012-11-16: 120 mg via EPIDURAL

## 2012-11-16 MED ORDER — IOHEXOL 180 MG/ML  SOLN
1.0000 mL | Freq: Once | INTRAMUSCULAR | Status: AC | PRN
  Administered 2012-11-16: 1 mL via EPIDURAL

## 2012-11-17 ENCOUNTER — Other Ambulatory Visit: Payer: Self-pay | Admitting: Gastroenterology

## 2012-11-21 ENCOUNTER — Other Ambulatory Visit: Payer: Self-pay | Admitting: Gastroenterology

## 2012-12-08 ENCOUNTER — Other Ambulatory Visit: Payer: Self-pay | Admitting: Orthopedic Surgery

## 2012-12-08 DIAGNOSIS — M549 Dorsalgia, unspecified: Secondary | ICD-10-CM

## 2013-01-19 ENCOUNTER — Telehealth (HOSPITAL_COMMUNITY): Payer: Self-pay | Admitting: Radiology

## 2013-01-19 NOTE — Telephone Encounter (Signed)
I spoke with Dr. Annamaria Boots via telephone this morning.  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  He is unable to get back to [facility] to undergo steroid injection until next week at which time I will be out of the office.  We will plan on repeat transforaminal steroid injection in early August when I return.  In the meantime I will call in a Vicodin prescription #30, no refills,  for symptomatic relief as needed.

## 2013-01-25 ENCOUNTER — Other Ambulatory Visit: Payer: Self-pay | Admitting: Orthopedic Surgery

## 2013-01-25 ENCOUNTER — Ambulatory Visit
Admission: RE | Admit: 2013-01-25 | Discharge: 2013-01-25 | Disposition: A | Discharge status: Home / Self Care | Payer: Medicare Other | Source: Ambulatory Visit | Attending: Orthopedic Surgery | Admitting: Orthopedic Surgery

## 2013-01-25 DIAGNOSIS — M549 Dorsalgia, unspecified: Secondary | ICD-10-CM

## 2013-01-25 MED ORDER — METHYLPREDNISOLONE ACETATE 40 MG/ML INJ SUSP (RADIOLOG
120.0000 mg | Freq: Once | INTRAMUSCULAR | Status: AC
  Administered 2013-01-25: 120 mg via EPIDURAL

## 2013-01-25 MED ORDER — IOHEXOL 180 MG/ML  SOLN
1.0000 mL | Freq: Once | INTRAMUSCULAR | Status: AC | PRN
  Administered 2013-01-25: 1 mL via EPIDURAL

## 2013-02-23 ENCOUNTER — Ambulatory Visit
Admission: RE | Admit: 2013-02-23 | Discharge: 2013-02-23 | Disposition: A | Discharge status: Home / Self Care | Payer: Medicare Other | Source: Ambulatory Visit | Attending: Orthopedic Surgery | Admitting: Orthopedic Surgery

## 2013-02-23 ENCOUNTER — Other Ambulatory Visit: Payer: Self-pay | Admitting: Orthopedic Surgery

## 2013-02-23 DIAGNOSIS — M549 Dorsalgia, unspecified: Secondary | ICD-10-CM

## 2013-02-23 MED ORDER — METHYLPREDNISOLONE ACETATE 40 MG/ML INJ SUSP (RADIOLOG: 120.0000 mg | Freq: Once | INTRAMUSCULAR | Status: DC

## 2013-02-23 MED ORDER — IOHEXOL 180 MG/ML  SOLN: 1.0000 mL | Freq: Once | INTRAMUSCULAR | Status: AC | PRN

## 2013-06-14 ENCOUNTER — Other Ambulatory Visit: Payer: Self-pay | Admitting: Neurosurgery

## 2013-06-14 DIAGNOSIS — M47816 Spondylosis without myelopathy or radiculopathy, lumbar region: Secondary | ICD-10-CM

## 2013-06-19 ENCOUNTER — Ambulatory Visit
Admission: RE | Admit: 2013-06-19 | Discharge: 2013-06-19 | Disposition: A | Discharge status: Home / Self Care | Payer: Medicare Other | Source: Ambulatory Visit | Attending: Neurosurgery | Admitting: Neurosurgery

## 2013-06-19 DIAGNOSIS — M47816 Spondylosis without myelopathy or radiculopathy, lumbar region: Secondary | ICD-10-CM

## 2013-06-19 MED ORDER — GADOBENATE DIMEGLUMINE 529 MG/ML IV SOLN
17.0000 mL | Freq: Once | INTRAVENOUS | Status: AC | PRN
  Administered 2013-06-19: 17 mL via INTRAVENOUS

## 2013-06-22 ENCOUNTER — Other Ambulatory Visit: Payer: Self-pay | Admitting: Neurosurgery

## 2013-06-23 ENCOUNTER — Other Ambulatory Visit: Payer: Medicare Other

## 2013-06-23 ENCOUNTER — Inpatient Hospital Stay: Admission: RE | Admit: 2013-06-23 | Payer: Medicare Other | Source: Ambulatory Visit

## 2013-07-04 ENCOUNTER — Encounter (HOSPITAL_COMMUNITY): Payer: Self-pay | Admitting: Pharmacy Technician

## 2013-07-07 ENCOUNTER — Encounter (HOSPITAL_COMMUNITY): Payer: Self-pay

## 2013-07-07 ENCOUNTER — Encounter (HOSPITAL_COMMUNITY)
Admission: RE | Admit: 2013-07-07 | Discharge: 2013-07-07 | Disposition: A | Discharge status: Home / Self Care | Payer: Medicare Other | Source: Ambulatory Visit | Attending: Neurosurgery | Admitting: Neurosurgery

## 2013-07-07 ENCOUNTER — Other Ambulatory Visit (HOSPITAL_COMMUNITY): Payer: Self-pay | Admitting: *Deleted

## 2013-07-07 DIAGNOSIS — Z01812 Encounter for preprocedural laboratory examination: Secondary | ICD-10-CM | POA: Insufficient documentation

## 2013-07-07 DIAGNOSIS — Z01818 Encounter for other preprocedural examination: Secondary | ICD-10-CM | POA: Insufficient documentation

## 2013-07-07 HISTORY — DX: Unspecified osteoarthritis, unspecified site: M19.90

## 2013-07-07 LAB — CBC WITH DIFFERENTIAL/PLATELET
Basophils Absolute: 0 10*3/uL (ref 0.0–0.1)
Basophils Relative: 0 % (ref 0–1)
Eosinophils Absolute: 0.3 10*3/uL (ref 0.0–0.7)
Eosinophils Relative: 5 % (ref 0–5)
HCT: 38.2 % — ABNORMAL LOW (ref 39.0–52.0)
Hemoglobin: 13.5 g/dL (ref 13.0–17.0)
Lymphocytes Relative: 25 % (ref 12–46)
Lymphs Abs: 1.3 10*3/uL (ref 0.7–4.0)
MCH: 31.9 pg (ref 26.0–34.0)
MCHC: 35.3 g/dL (ref 30.0–36.0)
MCV: 90.3 fL (ref 78.0–100.0)
Monocytes Absolute: 0.4 10*3/uL (ref 0.1–1.0)
Monocytes Relative: 8 % (ref 3–12)
Neutro Abs: 3.2 10*3/uL (ref 1.7–7.7)
Neutrophils Relative %: 61 % (ref 43–77)
Platelets: 158 10*3/uL (ref 150–400)
RBC: 4.23 MIL/uL (ref 4.22–5.81)
RDW: 12.6 % (ref 11.5–15.5)
WBC: 5.2 10*3/uL (ref 4.0–10.5)

## 2013-07-07 LAB — BASIC METABOLIC PANEL
BUN: 15 mg/dL (ref 6–23)
CO2: 26 mEq/L (ref 19–32)
Calcium: 8.9 mg/dL (ref 8.4–10.5)
Chloride: 103 mEq/L (ref 96–112)
Creatinine, Ser: 0.81 mg/dL (ref 0.50–1.35)
GFR calc Af Amer: >90 mL/min (ref >90)
GFR calc non Af Amer: 87 mL/min — ABNORMAL LOW (ref >90)
Glucose, Bld: 148 mg/dL — ABNORMAL HIGH (ref 70–99)
Potassium: 3.9 mEq/L (ref 3.7–5.3)
Sodium: 141 mEq/L (ref 137–147)

## 2013-07-07 LAB — SURGICAL PCR SCREEN
MRSA, PCR: NEGATIVE
Staphylococcus aureus: NEGATIVE

## 2013-07-07 NOTE — Pre-Procedure Instructions (Addendum)
Kenneth [name]  07/07/2013   Your procedure is scheduled on:  07/11/13  Report to [facility] short stay admitting at 730 AM.  Call this number if you have problems the morning of surgery: [phone]   Remember:   Do not eat food or drink liquids after midnight.   Take these medicines the morning of surgery with A SIP OF WATER: robaxin if needed           STOP all herbel meds, nsaids (aleve,naproxen,advil,ibuprofen) now including aspirin, coq10, vitamins, probiotic, meloxicam   Do not wear jewelry, make-up or nail polish.  Do not wear lotions, powders, or perfumes. You may wear deodorant.  Do not shave 48 hours prior to surgery. Men may shave face and neck.  Do not bring valuables to the hospital.  [facility] is not responsible                  for any belongings or valuables.               Contacts, dentures or bridgework may not be worn into surgery.  Leave suitcase in the car. After surgery it may be brought to your room.  For patients admitted to the hospital, discharge time is determined by your                treatment team.               Patients discharged the day of surgery will not be allowed to drive  home.  Name and phone number of your driver:  Special Instructions: Shower using CHG 2 nights before surgery and the night before surgery.  If you shower the day of surgery use CHG.  Use special wash - you have one bottle of CHG for all showers.  You should use approximately 1/3 of the bottle for each shower.   Please read over the following fact sheets that you were given: Pain Booklet, Coughing and Deep Breathing, MRSA Information and Surgical Site Infection Prevention

## 2013-07-10 MED ORDER — DEXAMETHASONE SODIUM PHOSPHATE 10 MG/ML IJ SOLN
10.0000 mg | INTRAMUSCULAR | Status: AC
  Administered 2013-07-11: 10 mg via INTRAVENOUS
  Filled 2013-07-10: qty 1

## 2013-07-10 MED ORDER — CEFAZOLIN SODIUM-DEXTROSE 2-3 GM-% IV SOLR
2.0000 g | INTRAVENOUS | Status: AC
  Administered 2013-07-11: 2 g via INTRAVENOUS
  Filled 2013-07-10: qty 50

## 2013-07-11 ENCOUNTER — Inpatient Hospital Stay (HOSPITAL_COMMUNITY)
Admission: RE | Admit: 2013-07-11 | Discharge: 2013-07-12 | DRG: 520 | Disposition: A | Discharge status: Home / Self Care | Payer: Medicare Other | Source: Ambulatory Visit | Attending: Neurosurgery | Admitting: Neurosurgery

## 2013-07-11 ENCOUNTER — Encounter (HOSPITAL_COMMUNITY): Payer: Medicare Other | Admitting: Critical Care Medicine

## 2013-07-11 ENCOUNTER — Inpatient Hospital Stay (HOSPITAL_COMMUNITY): Payer: Medicare Other

## 2013-07-11 ENCOUNTER — Encounter (HOSPITAL_COMMUNITY)
Admission: RE | Disposition: A | Discharge status: Home / Self Care | Payer: Self-pay | Source: Ambulatory Visit | Attending: Neurosurgery

## 2013-07-11 ENCOUNTER — Inpatient Hospital Stay (HOSPITAL_COMMUNITY): Payer: Medicare Other | Admitting: Critical Care Medicine

## 2013-07-11 ENCOUNTER — Encounter (HOSPITAL_COMMUNITY): Payer: Self-pay | Admitting: *Deleted

## 2013-07-11 DIAGNOSIS — K5730 Diverticulosis of large intestine without perforation or abscess without bleeding: Secondary | ICD-10-CM | POA: Diagnosis present

## 2013-07-11 DIAGNOSIS — K648 Other hemorrhoids: Secondary | ICD-10-CM | POA: Diagnosis present

## 2013-07-11 DIAGNOSIS — E785 Hyperlipidemia, unspecified: Secondary | ICD-10-CM | POA: Diagnosis present

## 2013-07-11 DIAGNOSIS — K219 Gastro-esophageal reflux disease without esophagitis: Secondary | ICD-10-CM | POA: Diagnosis present

## 2013-07-11 DIAGNOSIS — M48062 Spinal stenosis, lumbar region with neurogenic claudication: Secondary | ICD-10-CM | POA: Diagnosis present

## 2013-07-11 DIAGNOSIS — Z7982 Long term (current) use of aspirin: Secondary | ICD-10-CM

## 2013-07-11 DIAGNOSIS — M47817 Spondylosis without myelopathy or radiculopathy, lumbosacral region: Principal | ICD-10-CM | POA: Diagnosis present

## 2013-07-11 DIAGNOSIS — K589 Irritable bowel syndrome without diarrhea: Secondary | ICD-10-CM | POA: Diagnosis present

## 2013-07-11 DIAGNOSIS — Z9849 Cataract extraction status, unspecified eye: Secondary | ICD-10-CM

## 2013-07-11 DIAGNOSIS — Z8249 Family history of ischemic heart disease and other diseases of the circulatory system: Secondary | ICD-10-CM

## 2013-07-11 DIAGNOSIS — Z79899 Other long term (current) drug therapy: Secondary | ICD-10-CM

## 2013-07-11 HISTORY — PX: LUMBAR LAMINECTOMY/DECOMPRESSION MICRODISCECTOMY: SHX5026

## 2013-07-11 SURGERY — LUMBAR LAMINECTOMY/DECOMPRESSION MICRODISCECTOMY 3 LEVELS
Anesthesia: General | Site: Back | Laterality: Left

## 2013-07-11 MED ORDER — HYDROCODONE-ACETAMINOPHEN 5-325 MG PO TABS
1.0000 | ORAL_TABLET | ORAL | Status: DC | PRN
  Administered 2013-07-11: 2 via ORAL
  Filled 2013-07-11: qty 2

## 2013-07-11 MED ORDER — EPHEDRINE SULFATE 50 MG/ML IJ SOLN
INTRAMUSCULAR | Status: DC | PRN
  Administered 2013-07-11: 5 mg via INTRAVENOUS
  Administered 2013-07-11 (×2): 10 mg via INTRAVENOUS

## 2013-07-11 MED ORDER — NEOSTIGMINE METHYLSULFATE 1 MG/ML IJ SOLN
INTRAMUSCULAR | Status: DC | PRN
  Administered 2013-07-11: 3 mg via INTRAVENOUS

## 2013-07-11 MED ORDER — PANTOPRAZOLE SODIUM 40 MG PO TBEC: 80.0000 mg | DELAYED_RELEASE_TABLET | Freq: Every day | ORAL | Status: DC

## 2013-07-11 MED ORDER — ONDANSETRON HCL 4 MG/2ML IJ SOLN: 4.0000 mg | INTRAMUSCULAR | Status: DC | PRN

## 2013-07-11 MED ORDER — LIDOCAINE HCL (CARDIAC) 20 MG/ML IV SOLN
INTRAVENOUS | Status: DC | PRN
  Administered 2013-07-11: 80 mg via INTRAVENOUS

## 2013-07-11 MED ORDER — LIDOCAINE HCL 4 % MT SOLN
OROMUCOSAL | Status: DC | PRN
  Administered 2013-07-11: 5 mL via TOPICAL

## 2013-07-11 MED ORDER — LORATADINE 10 MG PO TABS
10.0000 mg | ORAL_TABLET | Freq: Every day | ORAL | Status: DC
  Filled 2013-07-11 (×2): qty 1

## 2013-07-11 MED ORDER — PHENYLEPHRINE HCL 10 MG/ML IJ SOLN
INTRAMUSCULAR | Status: DC | PRN
  Administered 2013-07-11: 80 ug via INTRAVENOUS
  Administered 2013-07-11: 120 ug via INTRAVENOUS
  Administered 2013-07-11: 80 ug via INTRAVENOUS

## 2013-07-11 MED ORDER — PHENOL 1.4 % MT LIQD: 1.0000 | OROMUCOSAL | Status: DC | PRN

## 2013-07-11 MED ORDER — LACTATED RINGERS IV SOLN
INTRAVENOUS | Status: DC
Start: 2013-07-11 — End: 2013-07-11
  Administered 2013-07-11: 08:00:00 via INTRAVENOUS

## 2013-07-11 MED ORDER — HYDROMORPHONE HCL PF 1 MG/ML IJ SOLN: 0.2500 mg | INTRAMUSCULAR | Status: DC | PRN

## 2013-07-11 MED ORDER — ALUM & MAG HYDROXIDE-SIMETH 200-200-20 MG/5ML PO SUSP: 30.0000 mL | Freq: Four times a day (QID) | ORAL | Status: DC | PRN

## 2013-07-11 MED ORDER — CYCLOBENZAPRINE HCL 10 MG PO TABS
10.0000 mg | ORAL_TABLET | Freq: Three times a day (TID) | ORAL | Status: DC | PRN
  Administered 2013-07-11: 10 mg via ORAL
  Filled 2013-07-11: qty 1

## 2013-07-11 MED ORDER — SODIUM CHLORIDE 0.9 % IJ SOLN: 3.0000 mL | INTRAMUSCULAR | Status: DC | PRN

## 2013-07-11 MED ORDER — GLYCOPYRROLATE 0.2 MG/ML IJ SOLN
INTRAMUSCULAR | Status: DC | PRN
  Administered 2013-07-11: 0.1 mg via INTRAVENOUS
  Administered 2013-07-11: 0.4 mg via INTRAVENOUS

## 2013-07-11 MED ORDER — SIMVASTATIN 20 MG PO TABS
20.0000 mg | ORAL_TABLET | Freq: Every evening | ORAL | Status: DC
  Administered 2013-07-11: 20 mg via ORAL
  Filled 2013-07-11 (×2): qty 1

## 2013-07-11 MED ORDER — SODIUM CHLORIDE 0.9 % IJ SOLN
3.0000 mL | Freq: Two times a day (BID) | INTRAMUSCULAR | Status: DC
  Administered 2013-07-11 (×2): 3 mL via INTRAVENOUS

## 2013-07-11 MED ORDER — THROMBIN 5000 UNITS EX SOLR
CUTANEOUS | Status: DC | PRN
  Administered 2013-07-11 (×2): 5000 [IU] via TOPICAL

## 2013-07-11 MED ORDER — ACETAMINOPHEN-CODEINE #3 300-30 MG PO TABS: 1.0000 | ORAL_TABLET | Freq: Three times a day (TID) | ORAL | Status: DC | PRN

## 2013-07-11 MED ORDER — PHENYLEPHRINE HCL 10 MG/ML IJ SOLN
10.0000 mg | INTRAVENOUS | Status: DC | PRN
  Administered 2013-07-11: 25 ug/min via INTRAVENOUS

## 2013-07-11 MED ORDER — CEFAZOLIN SODIUM 1-5 GM-% IV SOLN
1.0000 g | Freq: Three times a day (TID) | INTRAVENOUS | Status: AC
  Administered 2013-07-11 (×2): 1 g via INTRAVENOUS
  Filled 2013-07-11 (×2): qty 50

## 2013-07-11 MED ORDER — ROPINIROLE HCL 0.5 MG PO TABS
0.5000 mg | ORAL_TABLET | Freq: Every day | ORAL | Status: DC
  Administered 2013-07-11: 0.5 mg via ORAL
  Filled 2013-07-11 (×2): qty 1

## 2013-07-11 MED ORDER — OXYCODONE-ACETAMINOPHEN 5-325 MG PO TABS
1.0000 | ORAL_TABLET | ORAL | Status: DC | PRN
  Administered 2013-07-11: 2 via ORAL
  Filled 2013-07-11: qty 2

## 2013-07-11 MED ORDER — KETOROLAC TROMETHAMINE 30 MG/ML IJ SOLN
INTRAMUSCULAR | Status: DC | PRN
  Administered 2013-07-11: 30 mg via INTRAVENOUS

## 2013-07-11 MED ORDER — BUPIVACAINE HCL (PF) 0.25 % IJ SOLN
INTRAMUSCULAR | Status: DC | PRN
  Administered 2013-07-11: 20 mL

## 2013-07-11 MED ORDER — LACTATED RINGERS IV SOLN
INTRAVENOUS | Status: DC | PRN
  Administered 2013-07-11 (×2): via INTRAVENOUS

## 2013-07-11 MED ORDER — CARISOPRODOL 350 MG PO TABS: 175.0000 mg | ORAL_TABLET | Freq: Two times a day (BID) | ORAL | Status: DC | PRN

## 2013-07-11 MED ORDER — SENNA 8.6 MG PO TABS
1.0000 | ORAL_TABLET | Freq: Two times a day (BID) | ORAL | Status: DC
  Administered 2013-07-11: 8.6 mg via ORAL
  Filled 2013-07-11 (×3): qty 1

## 2013-07-11 MED ORDER — KETOROLAC TROMETHAMINE 30 MG/ML IJ SOLN
30.0000 mg | Freq: Four times a day (QID) | INTRAMUSCULAR | Status: DC
  Administered 2013-07-11 – 2013-07-12 (×3): 30 mg via INTRAVENOUS
  Filled 2013-07-11 (×6): qty 1

## 2013-07-11 MED ORDER — ROCURONIUM BROMIDE 100 MG/10ML IV SOLN
INTRAVENOUS | Status: DC | PRN
  Administered 2013-07-11: 50 mg via INTRAVENOUS

## 2013-07-11 MED ORDER — METHOCARBAMOL 500 MG PO TABS: 500.0000 mg | ORAL_TABLET | Freq: Three times a day (TID) | ORAL | Status: DC | PRN

## 2013-07-11 MED ORDER — ONDANSETRON HCL 4 MG/2ML IJ SOLN: 4.0000 mg | Freq: Once | INTRAMUSCULAR | Status: DC | PRN

## 2013-07-11 MED ORDER — HEMOSTATIC AGENTS (NO CHARGE) OPTIME
TOPICAL | Status: DC | PRN
  Administered 2013-07-11: 1 via TOPICAL

## 2013-07-11 MED ORDER — HYDROMORPHONE HCL PF 1 MG/ML IJ SOLN: 0.5000 mg | INTRAMUSCULAR | Status: DC | PRN

## 2013-07-11 MED ORDER — ACETAMINOPHEN 650 MG RE SUPP: 650.0000 mg | RECTAL | Status: DC | PRN

## 2013-07-11 MED ORDER — ONDANSETRON HCL 4 MG/2ML IJ SOLN
INTRAMUSCULAR | Status: DC | PRN
  Administered 2013-07-11: 4 mg via INTRAVENOUS

## 2013-07-11 MED ORDER — ARTIFICIAL TEARS OP OINT
TOPICAL_OINTMENT | OPHTHALMIC | Status: DC | PRN
  Administered 2013-07-11: 1 via OPHTHALMIC

## 2013-07-11 MED ORDER — FENTANYL CITRATE 0.05 MG/ML IJ SOLN
INTRAMUSCULAR | Status: DC | PRN
  Administered 2013-07-11: 50 ug via INTRAVENOUS
  Administered 2013-07-11: 100 ug via INTRAVENOUS
  Administered 2013-07-11: 25 ug via INTRAVENOUS
  Administered 2013-07-11: 50 ug via INTRAVENOUS
  Administered 2013-07-11: 25 ug via INTRAVENOUS

## 2013-07-11 MED ORDER — PROPOFOL 10 MG/ML IV BOLUS
INTRAVENOUS | Status: DC | PRN
  Administered 2013-07-11: 20 mg via INTRAVENOUS
  Administered 2013-07-11: 200 mg via INTRAVENOUS

## 2013-07-11 MED ORDER — FLUTICASONE PROPIONATE 50 MCG/ACT NA SUSP: 2.0000 | Freq: Every day | NASAL | Status: DC | PRN

## 2013-07-11 MED ORDER — MELOXICAM 7.5 MG PO TABS: 7.5000 mg | ORAL_TABLET | Freq: Every day | ORAL | Status: DC

## 2013-07-11 MED ORDER — BACITRACIN 50000 UNITS IM SOLR
INTRAMUSCULAR | Status: DC | PRN
  Administered 2013-07-11: 10:00:00

## 2013-07-11 MED ORDER — ACETAMINOPHEN 325 MG PO TABS: 650.0000 mg | ORAL_TABLET | ORAL | Status: DC | PRN

## 2013-07-11 MED ORDER — ASPIRIN 81 MG PO CHEW
81.0000 mg | CHEWABLE_TABLET | Freq: Every day | ORAL | Status: DC
  Filled 2013-07-11: qty 1

## 2013-07-11 MED ORDER — MENTHOL 3 MG MT LOZG: 1.0000 | LOZENGE | OROMUCOSAL | Status: DC | PRN

## 2013-07-11 SURGICAL SUPPLY — 56 items
ADH SKN CLS APL DERMABOND .7 (GAUZE/BANDAGES/DRESSINGS) ×1
ADH SKN CLS LQ APL DERMABOND (GAUZE/BANDAGES/DRESSINGS) ×1
APL SKNCLS STERI-STRIP NONHPOA (GAUZE/BANDAGES/DRESSINGS) ×1
BAG DECANTER FOR FLEXI CONT (MISCELLANEOUS) ×3 IMPLANT
BENZOIN TINCTURE PRP APPL 2/3 (GAUZE/BANDAGES/DRESSINGS) ×3 IMPLANT
BLADE SURG ROTATE 9660 (MISCELLANEOUS) IMPLANT
BRUSH SCRUB EZ PLAIN DRY (MISCELLANEOUS) ×3 IMPLANT
BUR CUTTER 7.0 ROUND (BURR) ×3 IMPLANT
CANISTER SUCT 3000ML (MISCELLANEOUS) ×3 IMPLANT
CLOSURE WOUND 1/2 X4 (GAUZE/BANDAGES/DRESSINGS) ×1
CONT SPEC 4OZ CLIKSEAL STRL BL (MISCELLANEOUS) ×3 IMPLANT
DECANTER SPIKE VIAL GLASS SM (MISCELLANEOUS) ×3 IMPLANT
DERMABOND ADHESIVE PROPEN (GAUZE/BANDAGES/DRESSINGS) ×2
DERMABOND ADVANCED (GAUZE/BANDAGES/DRESSINGS) ×2
DERMABOND ADVANCED .7 DNX12 (GAUZE/BANDAGES/DRESSINGS) ×1 IMPLANT
DERMABOND ADVANCED .7 DNX6 (GAUZE/BANDAGES/DRESSINGS) IMPLANT
DRAPE LAPAROTOMY 100X72X124 (DRAPES) ×3 IMPLANT
DRAPE MICROSCOPE ZEISS OPMI (DRAPES) ×3 IMPLANT
DRAPE POUCH INSTRU U-SHP 10X18 (DRAPES) ×3 IMPLANT
DRAPE PROXIMA HALF (DRAPES) IMPLANT
DRAPE SURG 17X23 STRL (DRAPES) ×6 IMPLANT
DURAPREP 26ML APPLICATOR (WOUND CARE) ×3 IMPLANT
ELECT REM PT RETURN 9FT ADLT (ELECTROSURGICAL) ×3
ELECTRODE REM PT RTRN 9FT ADLT (ELECTROSURGICAL) ×1 IMPLANT
EVACUATOR 1/8 PVC DRAIN (DRAIN) ×3 IMPLANT
GAUZE SPONGE 4X4 16PLY XRAY LF (GAUZE/BANDAGES/DRESSINGS) IMPLANT
GLOVE ECLIPSE 7.0 STRL STRAW (GLOVE) ×2 IMPLANT
GLOVE ECLIPSE 8.5 STRL (GLOVE) ×3 IMPLANT
GLOVE EXAM NITRILE LRG STRL (GLOVE) IMPLANT
GLOVE EXAM NITRILE MD LF STRL (GLOVE) IMPLANT
GLOVE EXAM NITRILE XL STR (GLOVE) IMPLANT
GLOVE EXAM NITRILE XS STR PU (GLOVE) IMPLANT
GLOVE INDICATOR 7.0 STRL GRN (GLOVE) ×2 IMPLANT
GLOVE INDICATOR 7.5 STRL GRN (GLOVE) ×2 IMPLANT
GLOVE SURG SS PI 7.0 STRL IVOR (GLOVE) ×6 IMPLANT
GOWN BRE IMP SLV AUR LG STRL (GOWN DISPOSABLE) ×3 IMPLANT
GOWN BRE IMP SLV AUR XL STRL (GOWN DISPOSABLE) ×5 IMPLANT
GOWN STRL REIN 2XL LVL4 (GOWN DISPOSABLE) IMPLANT
KIT BASIN OR (CUSTOM PROCEDURE TRAY) ×3 IMPLANT
KIT ROOM TURNOVER OR (KITS) ×3 IMPLANT
NEEDLE HYPO 22GX1.5 SAFETY (NEEDLE) ×3 IMPLANT
NEEDLE SPNL 22GX3.5 QUINCKE BK (NEEDLE) ×3 IMPLANT
NS IRRIG 1000ML POUR BTL (IV SOLUTION) ×3 IMPLANT
PACK LAMINECTOMY NEURO (CUSTOM PROCEDURE TRAY) ×3 IMPLANT
PAD ARMBOARD 7.5X6 YLW CONV (MISCELLANEOUS) ×9 IMPLANT
RUBBERBAND STERILE (MISCELLANEOUS) ×6 IMPLANT
SPONGE GAUZE 4X4 12PLY (GAUZE/BANDAGES/DRESSINGS) ×3 IMPLANT
SPONGE SURGIFOAM ABS GEL SZ50 (HEMOSTASIS) ×3 IMPLANT
STRIP CLOSURE SKIN 1/2X4 (GAUZE/BANDAGES/DRESSINGS) ×2 IMPLANT
SUT VIC AB 2-0 CT1 18 (SUTURE) ×3 IMPLANT
SUT VIC AB 3-0 SH 8-18 (SUTURE) ×3 IMPLANT
SYR 20ML ECCENTRIC (SYRINGE) ×3 IMPLANT
TAPE CLOTH SURG 4X10 WHT LF (GAUZE/BANDAGES/DRESSINGS) ×2 IMPLANT
TOWEL OR 17X24 6PK STRL BLUE (TOWEL DISPOSABLE) ×3 IMPLANT
TOWEL OR 17X26 10 PK STRL BLUE (TOWEL DISPOSABLE) ×3 IMPLANT
WATER STERILE IRR 1000ML POUR (IV SOLUTION) ×3 IMPLANT

## 2013-07-11 NOTE — Preoperative (Signed)
Beta Blockers   Reason not to administer Beta Blockers:Not Applicable 

## 2013-07-11 NOTE — Op Note (Signed)
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  Postoperative diagnosis: Same  Procedure Name: Left L1-2, L2-3, L3-4 decompressive laminotomies with left L1, L2, L3, L4 decompressive foraminotomies.  Surgeon:Ashwin Tibbs A.Aaima Gaddie, M.D.  Asst. Surgeon: Kathyrn Sheriff  Anesthesia: General  Indication: The patient is a 73 year old male with significant left lower extremity radicular pain and elements of neurogenic claudication secondary to lumbar spondylosis and stenosis. He presents now for left-sided decompressive surgery at L1-2, L2-3, L3-4.  Operative note: After induction of anesthesia, the patient is position prone onto Wilson frame and appropriately padded. Lumbar region prepped and draped. Incision made overlying L2-3. Subperiosteal dissection performed exposing the lamina and facet joints of L1-2, L2-3, L3-4. Intraoperative x-ray taken levels confirmed. Decompressive laminotomies and foraminotomies were performed using high-speed drill and Kerrison rongeurs to remove the inferior aspect of the lamina of L1-L2 and L3 the superior aspect of the lamina of L2, L3, L4 and the medial aspect of L1-2, L2-3, L3-4 facet joint. A bursectomy sterile fashion. The lateral gutters were undercut and redundant ligament and spondylitic protrusions were removed exiting L1, L2, L3, L4 nerve roots were identified and wide decompressive foraminotomies were performed on the course of the exiting nerve roots. In the process of decompressing the L3 nerve. He became necessary to complete the hemilaminectomy of L3 at this point a thorough decompression had been achieved. There is no evidence of injury to the thecal sac or nerve roots. At the L3-4 level as elements of bilateral stenosis a decompression of the right side was also performed bu undercutting the  ligament of L3 and resecting the ligamenum flavum and spondylosis.  Wound was then irrigated and closed.  No apparent complication.  The patient tolerated the procedure well and returns to recovery post op.

## 2013-07-11 NOTE — Brief Op Note (Signed)
07/11/2013  11:32 AM  PATIENT:  Kenneth [name]  73 y.o. male  PRE-OPERATIVE DIAGNOSIS:  spondylosis  POST-OPERATIVE DIAGNOSIS:  spondylosis  PROCEDURE:  Procedure(s): LUMBAR ONE TO TWO, LUMBAR TWO TO THREE, LUMBAR THREE TO FOUR LUMBAR LAMINECTOMY/DECOMPRESSION MICRODISCECTOMY 3 LEVELS (Left)  SURGEON:  Surgeon(s) and Role:    * Charlie Pitter, MD - Primary    * Consuella Lose, MD - Assisting  PHYSICIAN ASSISTANT:   ASSISTANTS:    ANESTHESIA:   general  EBL:  Total I/O In: 1300 [I.V.:1300] Out: 200 [Blood:200]  BLOOD ADMINISTERED:none  DRAINS: (Medium) Hemovact drain(s) in the Epidural space with  Suction Open   LOCAL MEDICATIONS USED:  MARCAINE     SPECIMEN:  No Specimen  DISPOSITION OF SPECIMEN:  N/A  COUNTS:  YES  TOURNIQUET:  * No tourniquets in log *  DICTATION: .Dragon Dictation  PLAN OF CARE: Admit for overnight observation  PATIENT DISPOSITION:  PACU - hemodynamically stable.   Delay start of Pharmacological VTE agent (>24hrs) due to surgical blood loss or risk of bleeding: yes

## 2013-07-11 NOTE — Anesthesia Procedure Notes (Signed)
Procedure Name: Intubation Date/Time: 07/11/2013 9:09 AM Performed by: Carola Frost Pre-anesthesia Checklist: Patient identified, Timeout performed, Emergency Drugs available, Patient being monitored and Suction available Patient Re-evaluated:Patient Re-evaluated prior to inductionOxygen Delivery Method: Circle system utilized Preoxygenation: Pre-oxygenation with 100% oxygen Intubation Type: IV induction Ventilation: Mask ventilation without difficulty and Oral airway inserted - appropriate to patient size Laryngoscope Size: Mac and 4 Grade View: Grade III Tube type: Oral Tube size: 7.5 mm Number of attempts: 1 Airway Equipment and Method: Stylet Placement Confirmation: positive ETCO2,  ETT inserted through vocal cords under direct vision and breath sounds checked- equal and bilateral Secured at: 23 cm Tube secured with: Tape Dental Injury: Teeth and Oropharynx as per pre-operative assessment

## 2013-07-11 NOTE — Anesthesia Postprocedure Evaluation (Signed)
  Anesthesia Post-op Note  Patient: Kenneth [name]  Procedure(s) Performed: Procedure(s): LUMBAR ONE TO TWO, LUMBAR TWO TO THREE, LUMBAR THREE TO FOUR LUMBAR LAMINECTOMY/DECOMPRESSION MICRODISCECTOMY 3 LEVELS (Left)  Patient Location: PACU  Anesthesia Type:General  Level of Consciousness: awake, alert , oriented and patient cooperative  Airway and Oxygen Therapy: Patient Spontanous Breathing  Post-op Pain: mild  Post-op Assessment: Post-op Vital signs reviewed, Patient's Cardiovascular Status Stable, Respiratory Function Stable, Patent Airway, No signs of Nausea or vomiting and Pain level controlled  Post-op Vital Signs: stable  Complications: No apparent anesthesia complications

## 2013-07-11 NOTE — H&P (Signed)
Kenneth [name] is an 73 y.o. male.   Chief Complaint: Left leg pain HPI: 73 year old male with progressive left groin and thigh pain particularly with standing or changing positions. Symptoms have failed conservative management including multiple injections in the past. He is beginning to experience weakness in his left thigh. He has minimal if any right-sided symptoms. He is status post previous decompressive surgery remotely in the past at L4-5 on the left. Workup demonstrates evidence of significant spondylosis and stenosis on the left-sided L1-2, L2-3 and L3-4. Patient presents now for three-level decompressive surgery in hopes of improving his symptoms.  Past Medical History  Diagnosis Date  . Other specified gastritis without mention of hemorrhage   . Hiatal hernia   . Internal hemorrhoids without mention of complication   . Irritable bowel syndrome   . Diverticulosis of colon (without mention of hemorrhage)   . GERD (gastroesophageal reflux disease)   . HLD (hyperlipidemia)   . Arthritis     Past Surgical History  Procedure Laterality Date  . Lumbar disc surgery  1986, 1995    x 2  . Carpal tunnel release      left  . Cataract extraction  06/2011    bilateral    Family History  Problem Relation Age of Onset  . Heart disease Father    Social History:  reports that he has never smoked. He has never used smokeless tobacco. He reports that he drinks alcohol. He reports that he does not use illicit drugs.  Allergies: No Known Allergies  Medications Prior to Admission  Medication Sig Dispense Refill  . acetaminophen-codeine (TYLENOL #3) 300-30 MG per tablet Take 1 tablet by mouth every 8 (eight) hours as needed for moderate pain.       Marland Kitchen aspirin 81 MG chewable tablet Chew 81 mg by mouth daily.      . carisoprodol (SOMA) 350 MG tablet Take 175 mg by mouth 2 (two) times daily as needed for muscle spasms.      . CO-ENZYME Q-10 PO Take 1 tablet by mouth daily.      .  Diphenoxylate-Atropine (LOMOTIL PO) Take 1 tablet by mouth daily as needed (loose stool).       Marland Kitchen esomeprazole (NEXIUM) 40 MG capsule Take 40 mg by mouth daily before breakfast.      . fexofenadine (ALLEGRA) 30 MG tablet Take 30 mg by mouth daily as needed (allergies).       . fluticasone (FLONASE) 50 MCG/ACT nasal spray Place 2 sprays into the nose daily as needed for allergies.       . Glucosamine-Chondroitin (GLUCOSAMINE CHONDR COMPLEX PO) Take 1 tablet by mouth daily.      Marland Kitchen Hyaluronic Acid-Vitamin C (HYALURONIC ACID PO) Take 1 tablet by mouth daily.      . meloxicam (MOBIC) 7.5 MG tablet Take 7.5 mg by mouth daily.      . methocarbamol (ROBAXIN) 500 MG tablet Take 500 mg by mouth every 8 (eight) hours as needed for muscle spasms.       . Probiotic Product (ALIGN) 4 MG CAPS Take 1 tablet by mouth daily.      Marland Kitchen rOPINIRole (REQUIP) 0.5 MG tablet Take 0.5 mg by mouth at bedtime.      . simvastatin (ZOCOR) 20 MG tablet Take 20 mg by mouth every evening.      Marland Kitchen VITAMIN D, CHOLECALCIFEROL, PO Take 1 tablet by mouth daily.        No results found for this  or any previous visit (from the past 48 hour(s)). No results found.  Review of Systems  Constitutional: Negative.   HENT: Negative.   Eyes: Negative.   Respiratory: Negative.   Cardiovascular: Negative.   Gastrointestinal: Negative.   Genitourinary: Negative.   Musculoskeletal: Negative.   Skin: Negative.   Neurological: Negative.   Endo/Heme/Allergies: Negative.   Psychiatric/Behavioral: Negative.     Blood pressure 131/76, pulse 69, temperature 97 F (36.1 C), temperature source Oral, resp. rate 18, SpO2 98.00%. Physical Exam  Constitutional: He is oriented to person, place, and time. He appears well-developed and well-nourished. No distress.  HENT:  Head: Normocephalic and atraumatic.  Right Ear: External ear normal.  Left Ear: External ear normal.  Nose: Nose normal.  Mouth/Throat: Oropharynx is clear and moist. No  oropharyngeal exudate.  Eyes: Conjunctivae and EOM are normal. Pupils are equal, round, and reactive to light. Right eye exhibits no discharge. Left eye exhibits no discharge.  Neck: Normal range of motion. Neck supple. No tracheal deviation present. No thyromegaly present.  Cardiovascular: Normal rate, regular rhythm, normal heart sounds and intact distal pulses.  Exam reveals no friction rub.   No murmur heard. Respiratory: Effort normal and breath sounds normal. No respiratory distress. He has no wheezes.  GI: Soft. Bowel sounds are normal. He exhibits no distension. There is no tenderness.  Musculoskeletal: Normal range of motion. He exhibits no edema and no tenderness.  Neurological: He is alert and oriented to person, place, and time. He displays normal reflexes. No cranial nerve deficit. He exhibits normal muscle tone. Coordination normal.  Skin: Skin is warm and dry. No rash noted. He is not diaphoretic. No erythema. No pallor.  Psychiatric: He has a normal mood and affect. His behavior is normal. Judgment and thought content normal.     Assessment/Plan Left L1-2, L2-3, L3-4 spondylosis with stenosis. Plan left L1-2, L2-3, L3-4 decompressive laminotomies and foraminotomies. Risks and benefits have been explained. Patient wishes to proceed.  Aarohi Redditt A 07/11/2013, 8:52 AM

## 2013-07-11 NOTE — Plan of Care (Signed)
Problem: Consults Goal: Diagnosis - Spinal Surgery Outcome: Completed/Met Date Met:  07/11/13 Lumbar Laminectomy (Complex)

## 2013-07-11 NOTE — Progress Notes (Signed)
Utilization review completed.

## 2013-07-11 NOTE — Transfer of Care (Signed)
Immediate Anesthesia Transfer of Care Note  Patient: Kenneth [name]  Procedure(s) Performed: Procedure(s): LUMBAR ONE TO TWO, LUMBAR TWO TO THREE, LUMBAR THREE TO FOUR LUMBAR LAMINECTOMY/DECOMPRESSION MICRODISCECTOMY 3 LEVELS (Left)  Patient Location: PACU  Anesthesia Type:General  Level of Consciousness: awake, alert  and oriented  Airway & Oxygen Therapy: Patient Spontanous Breathing and Patient connected to nasal cannula oxygen  Post-op Assessment: Report given to PACU RN, Post -op Vital signs reviewed and stable and Patient moving all extremities X 4  Post vital signs: Reviewed and stable  Complications: No apparent anesthesia complications

## 2013-07-11 NOTE — Anesthesia Preprocedure Evaluation (Addendum)
Anesthesia Evaluation  Patient identified by MRN, date of birth, ID band Patient awake    Reviewed: Allergy & Precautions, H&P , NPO status , Patient's Chart, lab work & pertinent test results  Airway Mallampati: I TM Distance: >3 FB Neck ROM: Full    Dental  (+) Dental Advisory Given and Teeth Intact   Pulmonary          Cardiovascular     Neuro/Psych    GI/Hepatic hiatal hernia, GERD-  Medicated,  Endo/Other    Renal/GU      Musculoskeletal  (+) Arthritis -,   Abdominal   Peds  Hematology   Anesthesia Other Findings   Reproductive/Obstetrics                          Anesthesia Physical Anesthesia Plan  ASA: II  Anesthesia Plan: General   Post-op Pain Management:    Induction: Intravenous  Airway Management Planned: Oral ETT  Additional Equipment:   Intra-op Plan:   Post-operative Plan: Extubation in OR  Informed Consent: I have reviewed the patients History and Physical, chart, labs and discussed the procedure including the risks, benefits and alternatives for the proposed anesthesia with the patient or authorized representative who has indicated his/her understanding and acceptance.   Dental advisory given  Plan Discussed with: Anesthesiologist and Surgeon  Anesthesia Plan Comments:         Anesthesia Quick Evaluation

## 2013-07-12 ENCOUNTER — Encounter (HOSPITAL_COMMUNITY): Payer: Self-pay | Admitting: Neurosurgery

## 2013-07-12 MED ORDER — HYDROCODONE-ACETAMINOPHEN 5-325 MG PO TABS: 1.0000 | ORAL_TABLET | ORAL | Status: DC | PRN

## 2013-07-12 MED ORDER — METHOCARBAMOL 500 MG PO TABS: 500.0000 mg | ORAL_TABLET | Freq: Three times a day (TID) | ORAL | Status: DC | PRN

## 2013-07-12 NOTE — Progress Notes (Signed)
Pt. Alert and oriented,follows simple instructions, denies pain. Incision area without swelling, redness or S/S of infection. Voiding adequate clear yellow urine. Moving all extremities well and vitals stable and documented. Lumbar surgery notes instructions given to patient and family member for home safety and precautions. Pt. and family stated understanding of instructions given.

## 2013-07-12 NOTE — Discharge Instructions (Signed)

## 2013-07-12 NOTE — Discharge Summary (Signed)
Physician Discharge Summary  Patient ID: Kenneth [name] MRN: [id number] DOB/AGE: 08/06/1940 73 y.o.  Admit date: 07/11/2013 Discharge date: 07/12/2013  Admission Diagnoses:  Discharge Diagnoses:  Principal Problem:   Lumbosacral spondylosis without myelopathy Active Problems:   Lumbar stenosis with neurogenic claudication   Discharged Condition: good  Hospital Course: Patient is admitted to hospital where he underwent an uneventful left-sided L1-L2, L2-3, L3-4 decompressive laminotomy and foraminotomies. Postoperatively he is done well. Preoperative back and leg pain are steadily improved. He's been up ambulating. He feels ready for discharge home.  Consults:   Significant Diagnostic Studies:   Treatments:   Discharge Exam: Blood pressure 98/63, pulse 84, temperature 98.7 F (37.1 C), temperature source Oral, resp. rate 18, SpO2 95.00%. Awake and alert. Oriented and appropriate. Motor and sensory function intact. Wound clean and dry. Chest and abdomen benign.  Disposition: 01-Home or Self Care     Medication List         acetaminophen-codeine 300-30 MG per tablet  Commonly known as:  TYLENOL #3  Take 1 tablet by mouth every 8 (eight) hours as needed for moderate pain.     ALIGN 4 MG Caps  Take 1 tablet by mouth daily.     aspirin 81 MG chewable tablet  Chew 81 mg by mouth daily.     carisoprodol 350 MG tablet  Commonly known as:  SOMA  Take 175 mg by mouth 2 (two) times daily as needed for muscle spasms.     CO-ENZYME Q-10 PO  Take 1 tablet by mouth daily.     esomeprazole 40 MG capsule  Commonly known as:  NEXIUM  Take 40 mg by mouth daily before breakfast.     fexofenadine 30 MG tablet  Commonly known as:  ALLEGRA  Take 30 mg by mouth daily as needed (allergies).     fluticasone 50 MCG/ACT nasal spray  Commonly known as:  FLONASE  Place 2 sprays into the nose daily as needed for allergies.     GLUCOSAMINE CHONDR COMPLEX PO  Take 1 tablet by mouth daily.      HYALURONIC ACID PO  Take 1 tablet by mouth daily.     HYDROcodone-acetaminophen 5-325 MG per tablet  Commonly known as:  NORCO/VICODIN  Take 1-2 tablets by mouth every 4 (four) hours as needed for moderate pain.     LOMOTIL PO  Take 1 tablet by mouth daily as needed (loose stool).     meloxicam 7.5 MG tablet  Commonly known as:  MOBIC  Take 7.5 mg by mouth daily.     methocarbamol 500 MG tablet  Commonly known as:  ROBAXIN  Take 1 tablet (500 mg total) by mouth every 8 (eight) hours as needed for muscle spasms.     rOPINIRole 0.5 MG tablet  Commonly known as:  REQUIP  Take 0.5 mg by mouth at bedtime.     simvastatin 20 MG tablet  Commonly known as:  ZOCOR  Take 20 mg by mouth every evening.     VITAMIN D (CHOLECALCIFEROL) PO  Take 1 tablet by mouth daily.         Signed: Janeil Schexnayder A 07/12/2013, 8:45 AM

## 2013-08-08 ENCOUNTER — Other Ambulatory Visit (HOSPITAL_COMMUNITY): Payer: Self-pay | Admitting: Internal Medicine

## 2013-08-08 ENCOUNTER — Ambulatory Visit (HOSPITAL_COMMUNITY)
Admission: RE | Admit: 2013-08-08 | Discharge: 2013-08-08 | Disposition: A | Discharge status: Home / Self Care | Payer: Medicare Other | Source: Ambulatory Visit | Attending: Internal Medicine | Admitting: Internal Medicine

## 2013-08-08 DIAGNOSIS — R079 Chest pain, unspecified: Secondary | ICD-10-CM | POA: Insufficient documentation

## 2013-08-08 DIAGNOSIS — R52 Pain, unspecified: Secondary | ICD-10-CM

## 2013-08-08 DIAGNOSIS — K219 Gastro-esophageal reflux disease without esophagitis: Secondary | ICD-10-CM | POA: Insufficient documentation

## 2013-08-08 DIAGNOSIS — M47814 Spondylosis without myelopathy or radiculopathy, thoracic region: Secondary | ICD-10-CM | POA: Insufficient documentation

## 2013-08-11 ENCOUNTER — Other Ambulatory Visit: Payer: Self-pay | Admitting: Neurosurgery

## 2013-08-11 DIAGNOSIS — M47816 Spondylosis without myelopathy or radiculopathy, lumbar region: Secondary | ICD-10-CM

## 2013-08-14 ENCOUNTER — Ambulatory Visit
Admission: RE | Admit: 2013-08-14 | Discharge: 2013-08-14 | Disposition: A | Discharge status: Home / Self Care | Payer: Medicare Other | Source: Ambulatory Visit | Attending: Neurosurgery | Admitting: Neurosurgery

## 2013-08-14 DIAGNOSIS — M47816 Spondylosis without myelopathy or radiculopathy, lumbar region: Secondary | ICD-10-CM

## 2013-08-14 MED ORDER — GADOBENATE DIMEGLUMINE 529 MG/ML IV SOLN
17.0000 mL | Freq: Once | INTRAVENOUS | Status: AC | PRN
  Administered 2013-08-14: 17 mL via INTRAVENOUS

## 2013-08-16 ENCOUNTER — Other Ambulatory Visit: Payer: Self-pay | Admitting: Neurosurgery

## 2013-08-16 DIAGNOSIS — M545 Low back pain, unspecified: Secondary | ICD-10-CM

## 2013-08-17 ENCOUNTER — Other Ambulatory Visit: Payer: Self-pay | Admitting: Neurosurgery

## 2013-08-17 ENCOUNTER — Ambulatory Visit
Admission: RE | Admit: 2013-08-17 | Discharge: 2013-08-17 | Disposition: A | Discharge status: Home / Self Care | Payer: Medicare Other | Source: Ambulatory Visit | Attending: Neurosurgery | Admitting: Neurosurgery

## 2013-08-17 DIAGNOSIS — M545 Low back pain, unspecified: Secondary | ICD-10-CM

## 2013-08-17 MED ORDER — METHYLPREDNISOLONE ACETATE 40 MG/ML INJ SUSP (RADIOLOG
120.0000 mg | Freq: Once | INTRAMUSCULAR | Status: AC
  Administered 2013-08-17: 120 mg via EPIDURAL

## 2013-08-17 MED ORDER — IOHEXOL 180 MG/ML  SOLN
1.0000 mL | Freq: Once | INTRAMUSCULAR | Status: AC | PRN
  Administered 2013-08-17: 1 mL via EPIDURAL

## 2013-10-14 ENCOUNTER — Emergency Department (HOSPITAL_COMMUNITY)
Admission: EM | Admit: 2013-10-14 | Discharge: 2013-10-14 | Disposition: A | Discharge status: Home / Self Care | Payer: Medicare Other | Attending: Emergency Medicine | Admitting: Emergency Medicine

## 2013-10-14 ENCOUNTER — Emergency Department (HOSPITAL_COMMUNITY): Payer: Medicare Other

## 2013-10-14 ENCOUNTER — Encounter (HOSPITAL_COMMUNITY): Payer: Self-pay | Admitting: Emergency Medicine

## 2013-10-14 DIAGNOSIS — Z7982 Long term (current) use of aspirin: Secondary | ICD-10-CM | POA: Insufficient documentation

## 2013-10-14 DIAGNOSIS — Z8679 Personal history of other diseases of the circulatory system: Secondary | ICD-10-CM | POA: Insufficient documentation

## 2013-10-14 DIAGNOSIS — Z79899 Other long term (current) drug therapy: Secondary | ICD-10-CM | POA: Insufficient documentation

## 2013-10-14 DIAGNOSIS — K219 Gastro-esophageal reflux disease without esophagitis: Secondary | ICD-10-CM | POA: Insufficient documentation

## 2013-10-14 DIAGNOSIS — E785 Hyperlipidemia, unspecified: Secondary | ICD-10-CM | POA: Insufficient documentation

## 2013-10-14 DIAGNOSIS — Z791 Long term (current) use of non-steroidal anti-inflammatories (NSAID): Secondary | ICD-10-CM | POA: Insufficient documentation

## 2013-10-14 DIAGNOSIS — R079 Chest pain, unspecified: Secondary | ICD-10-CM

## 2013-10-14 DIAGNOSIS — M129 Arthropathy, unspecified: Secondary | ICD-10-CM | POA: Insufficient documentation

## 2013-10-14 LAB — COMPREHENSIVE METABOLIC PANEL
ALT: 23 U/L (ref 0–53)
AST: 24 U/L (ref 0–37)
Albumin: 4.8 g/dL (ref 3.5–5.2)
Alkaline Phosphatase: 70 U/L (ref 39–117)
BUN: 11 mg/dL (ref 6–23)
CO2: 25 mEq/L (ref 19–32)
Calcium: 9.8 mg/dL (ref 8.4–10.5)
Chloride: 98 mEq/L (ref 96–112)
Creatinine, Ser: 0.76 mg/dL (ref 0.50–1.35)
GFR calc Af Amer: >90 mL/min (ref >90)
GFR calc non Af Amer: 90 mL/min — ABNORMAL LOW (ref >90)
Glucose, Bld: 126 mg/dL — ABNORMAL HIGH (ref 70–99)
Potassium: 4.1 mEq/L (ref 3.7–5.3)
Sodium: 139 mEq/L (ref 137–147)
Total Bilirubin: 0.7 mg/dL (ref 0.3–1.2)
Total Protein: 8.1 g/dL (ref 6.0–8.3)

## 2013-10-14 LAB — LIPASE, BLOOD: Lipase: 28 U/L (ref 11–59)

## 2013-10-14 LAB — I-STAT TROPONIN, ED
Troponin i, poc: 0 ng/mL (ref 0.00–0.08)
Troponin i, poc: 0.01 ng/mL (ref 0.00–0.08)

## 2013-10-14 LAB — CBC WITH DIFFERENTIAL/PLATELET
Basophils Absolute: 0 10*3/uL (ref 0.0–0.1)
Basophils Relative: 0 % (ref 0–1)
Eosinophils Absolute: 0.1 10*3/uL (ref 0.0–0.7)
Eosinophils Relative: 3 % (ref 0–5)
HCT: 39 % (ref 39.0–52.0)
Hemoglobin: 13.9 g/dL (ref 13.0–17.0)
Lymphocytes Relative: 27 % (ref 12–46)
Lymphs Abs: 1.1 10*3/uL (ref 0.7–4.0)
MCH: 32.1 pg (ref 26.0–34.0)
MCHC: 35.6 g/dL (ref 30.0–36.0)
MCV: 90.1 fL (ref 78.0–100.0)
Monocytes Absolute: 0.5 10*3/uL (ref 0.1–1.0)
Monocytes Relative: 13 % — ABNORMAL HIGH (ref 3–12)
Neutro Abs: 2.3 10*3/uL (ref 1.7–7.7)
Neutrophils Relative %: 57 % (ref 43–77)
Platelets: 170 10*3/uL (ref 150–400)
RBC: 4.33 MIL/uL (ref 4.22–5.81)
RDW: 13.1 % (ref 11.5–15.5)
WBC: 4 10*3/uL (ref 4.0–10.5)

## 2013-10-14 MED ORDER — ONDANSETRON HCL 4 MG/2ML IJ SOLN
4.0000 mg | Freq: Once | INTRAMUSCULAR | Status: AC
  Administered 2013-10-14: 4 mg via INTRAVENOUS
  Filled 2013-10-14: qty 2

## 2013-10-14 MED ORDER — GI COCKTAIL ~~LOC~~
30.0000 mL | Freq: Once | ORAL | Status: AC
  Administered 2013-10-14: 30 mL via ORAL
  Filled 2013-10-14: qty 30

## 2013-10-14 NOTE — ED Provider Notes (Signed)
TIME SEEN: 8:46 AM  CHIEF COMPLAINT: Mid sternal chest burning, nausea  HPI: Patient is a 73 year old male with a history of GERD, hyperlipidemia, hiatal hernia who presents emergency department with 5 days of constant mid substernal chest burning without radiation. He describes the pain as a 3/10. He states that he has tried taking his Nexium, Maalox, antacids and eating a "GERD diet" without relief. Denies any shortness of breath, vomiting, diaphoresis or dizziness. He reports he is also had 2 weeks of a productive cough and subjective fevers. No history of cardiac disease. No history of PE or DVT. He did have back surgery in January but no other recent immobilization, fracture, surgery, trauma, hospitalization. No lower extremity swelling or pain. No new back pain. He denies prior blood per rectum or melena. He does state for the past several days he has had light-colored stools.  ROS: See HPI Constitutional: no fever  Eyes: no drainage  ENT: no runny nose   Cardiovascular:  no chest pain  Resp: no SOB  GI: no vomiting GU: no dysuria Integumentary: no rash  Allergy: no hives  Musculoskeletal: no leg swelling  Neurological: no slurred speech ROS otherwise negative  PAST MEDICAL HISTORY/PAST SURGICAL HISTORY:  Past Medical History  Diagnosis Date  . Other specified gastritis without mention of hemorrhage   . Hiatal hernia   . Internal hemorrhoids without mention of complication   . Irritable bowel syndrome   . Diverticulosis of colon (without mention of hemorrhage)   . GERD (gastroesophageal reflux disease)   . HLD (hyperlipidemia)   . Arthritis     MEDICATIONS:  Prior to Admission medications   Medication Sig Start Date End Date Taking? Authorizing Provider  acetaminophen-codeine (TYLENOL #3) 300-30 MG per tablet Take 1 tablet by mouth every 8 (eight) hours as needed for moderate pain.     Historical Provider, MD  aspirin 81 MG chewable tablet Chew 81 mg by mouth daily.     Historical Provider, MD  carisoprodol (SOMA) 350 MG tablet Take 175 mg by mouth 2 (two) times daily as needed for muscle spasms.    Historical Provider, MD  CO-ENZYME Q-10 PO Take 1 tablet by mouth daily.    Historical Provider, MD  Diphenoxylate-Atropine (LOMOTIL PO) Take 1 tablet by mouth daily as needed (loose stool).     Historical Provider, MD  esomeprazole (NEXIUM) 40 MG capsule Take 40 mg by mouth daily before breakfast.    Historical Provider, MD  fexofenadine (ALLEGRA) 30 MG tablet Take 30 mg by mouth daily as needed (allergies).     Historical Provider, MD  fluticasone (FLONASE) 50 MCG/ACT nasal spray Place 2 sprays into the nose daily as needed for allergies.     Historical Provider, MD  Glucosamine-Chondroitin (GLUCOSAMINE CHONDR COMPLEX PO) Take 1 tablet by mouth daily.    Historical Provider, MD  Hyaluronic Acid-Vitamin C (HYALURONIC ACID PO) Take 1 tablet by mouth daily.    Historical Provider, MD  HYDROcodone-acetaminophen (NORCO/VICODIN) 5-325 MG per tablet Take 1-2 tablets by mouth every 4 (four) hours as needed for moderate pain. 07/12/13   Charlie Pitter, MD  meloxicam (MOBIC) 7.5 MG tablet Take 7.5 mg by mouth daily.    Historical Provider, MD  methocarbamol (ROBAXIN) 500 MG tablet Take 1 tablet (500 mg total) by mouth every 8 (eight) hours as needed for muscle spasms. 07/12/13   Charlie Pitter, MD  Probiotic Product (ALIGN) 4 MG CAPS Take 1 tablet by mouth daily.    Historical  Provider, MD  rOPINIRole (REQUIP) 0.5 MG tablet Take 0.5 mg by mouth at bedtime.    Historical Provider, MD  simvastatin (ZOCOR) 20 MG tablet Take 20 mg by mouth every evening.    Historical Provider, MD  VITAMIN D, CHOLECALCIFEROL, PO Take 1 tablet by mouth daily.    Historical Provider, MD    ALLERGIES:  No Known Allergies  SOCIAL HISTORY:  History  Substance Use Topics  . Smoking status: Never Smoker   . Smokeless tobacco: Never Used     Comment: social  . Alcohol Use: Yes     Comment: 6 per week     FAMILY HISTORY: Family History  Problem Relation Age of Onset  . Heart disease Father     EXAM: BP 124/81  Pulse 80  Temp(Src) 97.8 F (36.6 C) (Oral)  Resp 16  Ht _0  (1.778 m)  Wt 180 lb (81.647 kg)  BMI 25.83 kg/m2  SpO2 96% CONSTITUTIONAL: Alert and oriented and responds appropriately to questions. Well-appearing; well-nourished, pleasant, no apparent distress HEAD: Normocephalic EYES: Conjunctivae clear, PERRL ENT: normal nose; no rhinorrhea; moist mucous membranes; pharynx without lesions noted NECK: Supple, no meningismus, no LAD  CARD: RRR; S1 and S2 appreciated; no murmurs, no clicks, no rubs, no gallops RESP: Normal chest excursion without splinting or tachypnea; breath sounds clear and equal bilaterally; no wheezes, no rhonchi, no rales,  ABD/GI: Normal bowel sounds; non-distended; soft, non-tender, no rebound, no guarding, negative Murphy sign BACK:  The back appears normal and is non-tender to palpation, there is no CVA tenderness EXT: Normal ROM in all joints; non-tender to palpation; no edema; normal capillary refill; no cyanosis    SKIN: Normal color for age and race; warm NEURO: Moves all extremities equally PSYCH: The patient's mood and manner are appropriate. Grooming and personal hygiene are appropriate.  MEDICAL DECISION MAKING: Patient here with likely gastritis versus esophagitis versus ulcer. He states he is also concerned he may have a gastric ulcer. He states he is here because he wants an upper endoscopy and would like for his gastroenterologist to be contacted. He denies wanting a GI cocktail at this time. Have discussed with patient that we will obtain chest pain and abdominal labs and chest x-ray which he agrees to. Have also discussed with patient that this may be from his gallbladder as he does report lighter colored stools. He denies wanting a right upper quadrant ultrasound at this time.   ED PROGRESS: 9:30 AM  Spoke with Dr. Deatra Ina with  [facility].  Given this is not an emergent condition, he does not feel the patient needs an upper endoscopy today given the high acuity of patients that he currently has, he would not be able to perform this for several hours. He recommends giving a GI cocktail. Discuss this with patient who agrees to GI cocktail now and also right upper quadrant ultrasound. Troponin is negative.   10:16 AM  Pt's labs are unremarkable. No leukocytosis, LFTs and lipase normal. He states he has no relief with GI cocktail. We'll give IV Zofran and reassess. Ultrasound pending.  12:05 PM  Pt reports no improvement after IV Zofran. His ultrasound is negative. We'll repeat a second troponin. Have offered admission for further evaluation and treatment of his discomfort but he declines. Patient reports he has Vicodin at home that he can take but he is taking for his back pain. He denies wanting any further medication at this time in the ED.  12:33 PM  Pt's second troponin is negative. Have discussed return precautions and supportive care instructions. Have recommended following up with GI next week. We'll have him continue taking Nexium. Have instructed him to avoid NSAIDs including his daily MOBIC. Patient verbalizes understanding and is comfortable with this plan.    Date: 10/14/2013 8:50 AM  Rate: 82  Rhythm: normal sinus rhythm  QRS Axis: normal  Intervals: normal  ST/T Wave abnormalities: normal  Conduction Disutrbances: none  Narrative Interpretation: unremarkable; occasional PVCs, no ST changes      [place], DO 10/14/13 1234

## 2013-10-14 NOTE — ED Notes (Addendum)
Pt from home. complainns of midsternal chest pain for 5 days. Pt denies sob. Pt has hx of gerd. Pt also c/o nausea

## 2013-10-14 NOTE — ED Notes (Signed)
[place] at bedside.

## 2013-10-14 NOTE — Discharge Instructions (Signed)
Chest Pain (Nonspecific) °It is often hard to give a specific diagnosis for the cause of chest pain. There is always a chance that your pain could be related to something serious, such as a heart attack or a blood clot in the lungs. You need to follow up with your caregiver for further evaluation. °CAUSES  °· Heartburn. °· Pneumonia or bronchitis. °· Anxiety or stress. °· Inflammation around your heart (pericarditis) or lung (pleuritis or pleurisy). °· A blood clot in the lung. °· A collapsed lung (pneumothorax). It can develop suddenly on its own (spontaneous pneumothorax) or from injury (trauma) to the chest. °· Shingles infection (herpes zoster virus). °The chest wall is composed of bones, muscles, and cartilage. Any of these can be the source of the pain. °· The bones can be bruised by injury. °· The muscles or cartilage can be strained by coughing or overwork. °· The cartilage can be affected by inflammation and become sore (costochondritis). °DIAGNOSIS  °Lab tests or other studies, such as X-rays, electrocardiography, stress testing, or cardiac imaging, may be needed to find the cause of your pain.  °TREATMENT  °· Treatment depends on what may be causing your chest pain. Treatment may include: °· Acid blockers for heartburn. °· Anti-inflammatory medicine. °· Pain medicine for inflammatory conditions. °· Antibiotics if an infection is present. °· You may be advised to change lifestyle habits. This includes stopping smoking and avoiding alcohol, caffeine, and chocolate. °· You may be advised to keep your head raised (elevated) when sleeping. This reduces the chance of acid going backward from your stomach into your esophagus. °· Most of the time, nonspecific chest pain will improve within 2 to 3 days with rest and mild pain medicine. °HOME CARE INSTRUCTIONS  °· If antibiotics were prescribed, take your antibiotics as directed. Finish them even if you start to feel better. °· For the next few days, avoid physical  activities that bring on chest pain. Continue physical activities as directed. °· Do not smoke. °· Avoid drinking alcohol. °· Only take over-the-counter or prescription medicine for pain, discomfort, or fever as directed by your caregiver. °· Follow your caregiver's suggestions for further testing if your chest pain does not go away. °· Keep any follow-up appointments you made. If you do not go to an appointment, you could develop lasting (chronic) problems with pain. If there is any problem keeping an appointment, you must call to reschedule. °SEEK MEDICAL CARE IF:  °· You think you are having problems from the medicine you are taking. Read your medicine instructions carefully. °· Your chest pain does not go away, even after treatment. °· You develop a rash with blisters on your chest. °SEEK IMMEDIATE MEDICAL CARE IF:  °· You have increased chest pain or pain that spreads to your arm, neck, jaw, back, or abdomen. °· You develop shortness of breath, an increasing cough, or you are coughing up blood. °· You have severe back or abdominal pain, feel nauseous, or vomit. °· You develop severe weakness, fainting, or chills. °· You have a fever. °THIS IS AN EMERGENCY. Do not wait to see if the pain will go away. Get medical help at once. Call your local emergency services (911 in [place] Do not drive yourself to the hospital. °MAKE SURE YOU:  °· Understand these instructions. °· Will watch your condition. °· Will get help right away if you are not doing well or get worse. °Document Released: 04/01/2005 Document Revised: 09/14/2011 Document Reviewed: 01/26/2008 °[employer]® Patient Information ©2014 [employer]   [employer].   Diet for Gastroesophageal Reflux Disease, Adult Reflux (acid reflux) is when acid from your stomach flows up into the esophagus. When acid comes in contact with the esophagus, the acid causes irritation and soreness (inflammation) in the esophagus. When reflux happens often or so severely that it causes damage  to the esophagus, it is called gastroesophageal reflux disease (GERD). Nutrition therapy can help ease the discomfort of GERD. FOODS OR DRINKS TO AVOID OR LIMIT  Smoking or chewing tobacco. Nicotine is one of the most potent stimulants to acid production in the gastrointestinal tract.  Caffeinated and decaffeinated coffee and black tea.  Regular or low-calorie carbonated beverages or energy drinks (caffeine-free carbonated beverages are allowed).   Strong spices, such as black pepper, white pepper, red pepper, cayenne, curry powder, and chili powder.  Peppermint or spearmint.  Chocolate.  High-fat foods, including meats and fried foods. Extra added fats including oils, butter, salad dressings, and nuts. Limit these to less than 8 tsp per day.  Fruits and vegetables if they are not tolerated, such as citrus fruits or tomatoes.  Alcohol.  Any food that seems to aggravate your condition. If you have questions regarding your diet, call your caregiver or a registered dietitian. OTHER THINGS THAT MAY HELP GERD INCLUDE:   Eating your meals slowly, in a relaxed setting.  Eating 5 to 6 small meals per day instead of 3 large meals.  Eliminating food for a period of time if it causes distress.  Not lying down until 3 hours after eating a meal.  Keeping the head of your bed raised 6 to 9 inches (15 to 23 cm) by using a foam wedge or blocks under the legs of the bed. Lying flat may make symptoms worse.  Being physically active. Weight loss may be helpful in reducing reflux in overweight or obese adults.  Wear loose fitting clothing EXAMPLE MEAL PLAN This meal plan is approximately 2,000 calories based on [email or web address] meal planning guidelines. Breakfast   cup cooked oatmeal.  1 cup strawberries.  1 cup low-fat milk.  1 oz almonds. Snack  1 cup cucumber slices.  6 oz yogurt (made from low-fat or fat-free milk). Lunch  2 slice whole-wheat bread.  2 oz sliced  [place].  2 tsp mayonnaise.  1 cup blueberries.  1 cup snap peas. Snack  6 whole-wheat crackers.  1 oz string cheese. Dinner   cup brown rice.  1 cup mixed veggies.  1 tsp olive oil.  3 oz grilled fish. Document Released: 06/22/2005 Document Revised: 09/14/2011 Document Reviewed: 05/08/2011 [facility] Patient Information 2014 [place], [place].  Gastroesophageal Reflux Disease, Adult Gastroesophageal reflux disease (GERD) happens when acid from your stomach flows up into the esophagus. When acid comes in contact with the esophagus, the acid causes soreness (inflammation) in the esophagus. Over time, GERD may create small holes (ulcers) in the lining of the esophagus. CAUSES   Increased body weight. This puts pressure on the stomach, making acid rise from the stomach into the esophagus.  Smoking. This increases acid production in the stomach.  Drinking alcohol. This causes decreased pressure in the lower esophageal sphincter (valve or ring of muscle between the esophagus and stomach), allowing acid from the stomach into the esophagus.  Late evening meals and a full stomach. This increases pressure and acid production in the stomach.  A malformed lower esophageal sphincter. Sometimes, no cause is found. SYMPTOMS   Burning pain in the lower part of the mid-chest behind the breastbone and in  the mid-stomach area. This may occur twice a week or more often.  Trouble swallowing.  Sore throat.  Dry cough.  Asthma-like symptoms including chest tightness, shortness of breath, or wheezing. DIAGNOSIS  Your caregiver may be able to diagnose GERD based on your symptoms. In some cases, X-rays and other tests may be done to check for complications or to check the condition of your stomach and esophagus. TREATMENT  Your caregiver may recommend over-the-counter or prescription medicines to help decrease acid production. Ask your caregiver before starting or adding any new medicines.   HOME CARE INSTRUCTIONS   Change the factors that you can control. Ask your caregiver for guidance concerning weight loss, quitting smoking, and alcohol consumption.  Avoid foods and drinks that make your symptoms worse, such as:  Caffeine or alcoholic drinks.  Chocolate.  Peppermint or mint flavorings.  Garlic and onions.  Spicy foods.  Citrus fruits, such as oranges, lemons, or limes.  Tomato-based foods such as sauce, chili, salsa, and pizza.  Fried and fatty foods.  Avoid lying down for the 3 hours prior to your bedtime or prior to taking a nap.  Eat small, frequent meals instead of large meals.  Wear loose-fitting clothing. Do not wear anything tight around your waist that causes pressure on your stomach.  Raise the head of your bed 6 to 8 inches with wood blocks to help you sleep. Extra pillows will not help.  Only take over-the-counter or prescription medicines for pain, discomfort, or fever as directed by your caregiver.  Do not take aspirin, ibuprofen, or other nonsteroidal anti-inflammatory drugs (NSAIDs). SEEK IMMEDIATE MEDICAL CARE IF:   You have pain in your arms, neck, jaw, teeth, or back.  Your pain increases or changes in intensity or duration.  You develop nausea, vomiting, or sweating (diaphoresis).  You develop shortness of breath, or you faint.  Your vomit is green, yellow, black, or looks like coffee grounds or blood.  Your stool is red, bloody, or black. These symptoms could be signs of other problems, such as heart disease, gastric bleeding, or esophageal bleeding. MAKE SURE YOU:   Understand these instructions.  Will watch your condition.  Will get help right away if you are not doing well or get worse. Document Released: 04/01/2005 Document Revised: 09/14/2011 Document Reviewed: 01/09/2011 [facility] Patient Information 2014 [place], [place].  Possible Peptic Ulcer A peptic ulcer is a sore in the lining of in your esophagus (esophageal  ulcer), stomach (gastric ulcer), or in the first part of your small intestine (duodenal ulcer). The ulcer causes erosion into the deeper tissue. CAUSES  Normally, the lining of the stomach and the small intestine protects itself from the acid that digests food. The protective lining can be damaged by:  An infection caused by a bacterium called Helicobacter pylori (H. pylori).  Regular use of nonsteroidal anti-inflammatory drugs (NSAIDs), such as ibuprofen or aspirin.  Smoking tobacco. Other risk factors include being older than 37, drinking alcohol excessively, and having a family history of ulcer disease.  SYMPTOMS   Burning pain or gnawing in the area between the chest and the belly button.  Heartburn.  Nausea and vomiting.  Bloating. The pain can be worse on an empty stomach and at night. If the ulcer results in bleeding, it can cause:  Black, tarry stools.  Vomiting of bright red blood.  Vomiting of coffee ground looking materials. DIAGNOSIS  A diagnosis is usually made based upon your history and an exam. Other tests and procedures may  be performed to find the cause of the ulcer. Finding a cause will help determine the best treatment. Tests and procedures may include:  Blood tests, stool tests, or breath tests to check for the bacterium H. pylori.  An upper gastrointestinal (GI) series of the esophagus, stomach, and small intestine.  An endoscopy to examine the esophagus, stomach, and small intestine.  A biopsy. TREATMENT  Treatment may include:  Eliminating the cause of the ulcer, such as smoking, NSAIDs, or alcohol.  Medicines to reduce the amount of acid in your digestive tract.  Antibiotic medicines if the ulcer is caused by the H. pylori bacterium.  An upper endoscopy to treat a bleeding ulcer.  Surgery if the bleeding is severe or if the ulcer created a hole somewhere in the digestive system. HOME CARE INSTRUCTIONS   Avoid tobacco, alcohol, and caffeine.  Smoking can increase the acid in the stomach, and continued smoking will impair the healing of ulcers.  Avoid foods and drinks that seem to cause discomfort or aggravate your ulcer.  Only take medicines as directed by your caregiver. Do not substitute over-the-counter medicines for prescription medicines without talking to your caregiver.  Keep any follow-up appointments and tests as directed. SEEK MEDICAL CARE IF:   Your do not improve within 7 days of starting treatment.  You have ongoing indigestion or heartburn. SEEK IMMEDIATE MEDICAL CARE IF:   You have sudden, sharp, or persistent abdominal pain.  You have bloody or dark black, tarry stools.  You vomit blood or vomit that looks like coffee grounds.  You become light headed, weak, or feel faint.  You become sweaty or clammy. MAKE SURE YOU:   Understand these instructions.  Will watch your condition.  Will get help right away if you are not doing well or get worse. Document Released: 06/19/2000 Document Revised: 03/16/2012 Document Reviewed: 01/20/2012 [facility] Patient Information 2014 [place].

## 2013-10-17 ENCOUNTER — Other Ambulatory Visit: Payer: Self-pay | Admitting: *Deleted

## 2013-10-17 ENCOUNTER — Ambulatory Visit (AMBULATORY_SURGERY_CENTER): Payer: Self-pay

## 2013-10-17 VITALS — Ht 70.0 in | Wt 182.6 lb

## 2013-10-17 DIAGNOSIS — R079 Chest pain, unspecified: Secondary | ICD-10-CM

## 2013-10-18 ENCOUNTER — Ambulatory Visit (AMBULATORY_SURGERY_CENTER): Payer: Medicare Other | Admitting: Internal Medicine

## 2013-10-18 ENCOUNTER — Other Ambulatory Visit: Payer: Self-pay | Admitting: *Deleted

## 2013-10-18 ENCOUNTER — Encounter: Payer: Self-pay | Admitting: Internal Medicine

## 2013-10-18 VITALS — BP 127/64 | HR 64 | Temp 97.2°F | Resp 12 | Ht 70.0 in | Wt 182.0 lb

## 2013-10-18 DIAGNOSIS — R079 Chest pain, unspecified: Secondary | ICD-10-CM

## 2013-10-18 DIAGNOSIS — K219 Gastro-esophageal reflux disease without esophagitis: Secondary | ICD-10-CM

## 2013-10-18 MED ORDER — SODIUM CHLORIDE 0.9 % IV SOLN: 500.0000 mL | INTRAVENOUS | Status: DC

## 2013-10-18 MED ORDER — CILIDINIUM-CHLORDIAZEPOXIDE 2.5-5 MG PO CAPS: 1.0000 | ORAL_CAPSULE | Freq: Three times a day (TID) | ORAL | Status: DC

## 2013-10-18 NOTE — Patient Instructions (Signed)
YOU HAD AN ENDOSCOPIC PROCEDURE TODAY AT [facility]: Refer to the procedure report that was given to you for any specific questions about what was found during the examination.  If the procedure report does not answer your questions, please call your gastroenterologist to clarify.  If you requested that your care partner not be given the details of your procedure findings, then the procedure report has been included in a sealed envelope for you to review at your convenience later.  YOU SHOULD EXPECT: Some feelings of bloating in the abdomen. Passage of more gas than usual.  Walking can help get rid of the air that was put into your GI tract during the procedure and reduce the bloating. If you had a lower endoscopy (such as a colonoscopy or flexible sigmoidoscopy) you may notice spotting of blood in your stool or on the toilet paper. If you underwent a bowel prep for your procedure, then you may not have a normal bowel movement for a few days.  DIET: Your first meal following the procedure should be a light meal and then it is ok to progress to your normal diet.  A half-sandwich or bowl of soup is an example of a good first meal.  Heavy or fried foods are harder to digest and may make you feel nauseous or bloated.  Likewise meals heavy in dairy and vegetables can cause extra gas to form and this can also increase the bloating.  Drink plenty of fluids but you should avoid alcoholic beverages for 24 hours.  ACTIVITY: Your care partner should take you home directly after the procedure.  You should plan to take it easy, moving slowly for the rest of the day.  You can resume normal activity the day after the procedure however you should NOT DRIVE or use heavy machinery for 24 hours (because of the sedation medicines used during the test).    SYMPTOMS TO REPORT IMMEDIATELY: A gastroenterologist can be reached at any hour.  During normal business hours, 8:30 AM to 5:00 PM Monday through Friday,  call [phone].  After hours and on weekends, please call the GI answering service at [phone] who will take a message and have the physician on call contact you.   Following upper endoscopy (EGD)  Vomiting of blood or coffee ground material  New chest pain or pain under the shoulder blades  Painful or persistently difficult swallowing  New shortness of breath  Fever of 100F or higher  Black, tarry-looking stools  FOLLOW UP: If any biopsies were taken you will be contacted by phone or by letter within the next 1-3 weeks.  Call your gastroenterologist if you have not heard about the biopsies in 3 weeks.  Our staff will call the home number listed on your records the next business day following your procedure to check on you and address any questions or concerns that you may have at that time regarding the information given to you following your procedure. This is a courtesy call and so if there is no answer at the home number and we have not heard from you through the emergency physician on call, we will assume that you have returned to your regular daily activities without incident.  SIGNATURES/CONFIDENTIALITY: You and/or your care partner have signed paperwork which will be entered into your electronic medical record.  These signatures attest to the fact that that the information above on your After Visit Summary has been reviewed and is understood.  Full responsibility  of the confidentiality of this discharge information lies with you and/or your care-partner.  Resume medications.

## 2013-10-18 NOTE — Op Note (Signed)
[place]  [address]. [place], [address]   ENDOSCOPY PROCEDURE REPORT  PATIENT: Kenneth [name], Kenneth [name]  MR#: [id number] BIRTHDATE: April 06, 1941 , 72  yrs. old GENDER: Male ENDOSCOPIST: Eustace Quail, MD REFERRED BY:  .  [employer] PROCEDURE DATE:  10/18/2013 PROCEDURE:  EGD, diagnostic ASA CLASS:     Class II INDICATIONS:  Chest pain.   Nausea.   History of esophageal reflux.  MEDICATIONS: MAC sedation, administered by CRNA and propofol (Diprivan) 159m IV TOPICAL ANESTHETIC: none  DESCRIPTION OF PROCEDURE: After the risks benefits and alternatives of the procedure were thoroughly explained, informed consent was obtained.  The LB [id number] was introduced through the mouth and advanced to the second portion of the duodenum. Without limitations.  The instrument was slowly withdrawn as the mucosa was fully examined.      The upper, middle and distal third of the esophagus were carefully inspected and no abnormalities were noted.  The z-line was well seen at the GEJ.  The endoscope was pushed into the fundus which was normal including a retroflexed view.  The antrum, gastric body, first and second part of the duodenum were unremarkable. Retroflexed views revealed a hiatal hernia.     The scope was then withdrawn from the patient and the procedure completed.  COMPLICATIONS: There were no complications. ENDOSCOPIC IMPRESSION: 1. Normal EGD  RECOMMENDATIONS: 1. Continue Nexium twice daily for 2 weeks to see if this helps 2. Prescribed Librax; #100; one by mouth a.c. when necessary; 2 refills (patient use previously for IBS and requested refill)  REPEAT EXAM:  eSigned:  JEustace Quail MD 10/18/2013 11:10 AM   CHW:YSHUPJoylene Draft MD and The Patient

## 2013-10-18 NOTE — Progress Notes (Signed)
Report to pacu rn, vss, bbs=clear 

## 2013-10-19 ENCOUNTER — Telehealth: Payer: Self-pay | Admitting: *Deleted

## 2013-10-19 NOTE — Telephone Encounter (Signed)
  Follow up Call-  Call back number 10/18/2013 12/28/2011  Post procedure Call Back phone  # [phone]  Permission to leave phone message Yes Yes     Patient questions:  Do you have a fever, pain , or abdominal swelling? no Pain Score  0 *  Have you tolerated food without any problems? yes  Have you been able to return to your normal activities? yes  Do you have any questions about your discharge instructions: Diet   no Medications  no Follow up visit  no  Do you have questions or concerns about your Care? no  Actions: * If pain score is 4 or above: No action needed, pain <4.

## 2014-03-13 ENCOUNTER — Other Ambulatory Visit: Payer: Self-pay | Admitting: Dermatology

## 2014-04-09 ENCOUNTER — Other Ambulatory Visit: Payer: Self-pay | Admitting: Neurosurgery

## 2014-04-09 DIAGNOSIS — G8929 Other chronic pain: Secondary | ICD-10-CM

## 2014-04-09 DIAGNOSIS — G8918 Other acute postprocedural pain: Secondary | ICD-10-CM

## 2014-04-09 DIAGNOSIS — R1032 Left lower quadrant pain: Principal | ICD-10-CM

## 2014-04-11 ENCOUNTER — Ambulatory Visit
Admission: RE | Admit: 2014-04-11 | Discharge: 2014-04-11 | Disposition: A | Discharge status: Home / Self Care | Payer: Medicare Other | Source: Ambulatory Visit | Attending: Neurosurgery | Admitting: Neurosurgery

## 2014-04-11 ENCOUNTER — Other Ambulatory Visit: Payer: Medicare Other

## 2014-04-11 ENCOUNTER — Other Ambulatory Visit: Payer: Self-pay | Admitting: Neurosurgery

## 2014-04-11 DIAGNOSIS — G8918 Other acute postprocedural pain: Secondary | ICD-10-CM

## 2014-04-11 DIAGNOSIS — G8929 Other chronic pain: Secondary | ICD-10-CM

## 2014-04-11 DIAGNOSIS — R1032 Left lower quadrant pain: Principal | ICD-10-CM

## 2014-04-11 MED ORDER — METHYLPREDNISOLONE ACETATE 40 MG/ML INJ SUSP (RADIOLOG
120.0000 mg | Freq: Once | INTRAMUSCULAR | Status: AC
  Administered 2014-04-11: 120 mg via INTRA_ARTICULAR

## 2014-04-11 MED ORDER — IOHEXOL 180 MG/ML  SOLN
1.0000 mL | Freq: Once | INTRAMUSCULAR | Status: AC | PRN
Start: 2014-04-11 — End: 2014-04-11
  Administered 2014-04-11: 1 mL via INTRA_ARTICULAR

## 2014-09-19 ENCOUNTER — Other Ambulatory Visit: Payer: Self-pay | Admitting: Internal Medicine

## 2014-12-31 ENCOUNTER — Other Ambulatory Visit: Payer: Self-pay

## 2015-01-17 ENCOUNTER — Telehealth: Payer: Self-pay

## 2015-01-17 NOTE — Telephone Encounter (Signed)
Pt called and states that his librax script costs him 140.00 for 100 pills. Pt would like to see if a precert/prior auth can be done on the librax to decrease the cost. Pt states he uses [facility] [address]. Pt has [place] [employer].

## 2015-01-18 NOTE — Telephone Encounter (Signed)
I'll take care of it.

## 2015-01-23 ENCOUNTER — Telehealth: Payer: Self-pay

## 2015-01-23 NOTE — Telephone Encounter (Signed)
-----  Message from Kathline Magic sent at 01/22/2015 10:02 AM EDT ----- Dr. Annamaria Boots called for an update on his medication. Contact # [phone].

## 2015-01-23 NOTE — Telephone Encounter (Signed)
Sent Dr. Henrene Pastor staff message regarding other medication options for patient's abdominal pain and cramping.

## 2015-01-24 MED ORDER — DICYCLOMINE HCL 10 MG PO CAPS: ORAL_CAPSULE | ORAL | Status: AC

## 2015-01-24 NOTE — Telephone Encounter (Signed)
-----  Message from Irene Shipper, MD sent at 01/23/2015 10:53 AM EDT ----- Have him try Bentyl 20 mg; dispense 30; 1 by mouth every 6 hours as needed for abdominal cramping. ----- Message -----    From: Audrea Muscat, CMA    Sent: 01/23/2015  10:05 AM      To: Irene Shipper, MD  Patient given Librax in the past for abdominal cramping associated with IBS.  Librax is no longer covered by his insurance.  Pt states that after his first bm of the day, abdominal cramping and discomfort begins then setting off diarrhea.  He has been using Imodium before eating in the morning with some success.  What alternative would your recommend for him?  He likes the antianxiety aspect of Librax because stress and anxiety often set off his symptoms but understands other medications would not have that ingredient. Please advise.

## 2015-01-24 NOTE — Telephone Encounter (Signed)
Called pharmacy and had them run Bentyl per Dr. Henrene Pastor to check cost - #30 cap was $5.61.  Had them fill the rx and then called patient to let him know.  He is going to try it and see if it works as well as Librax.  If not he will let me know

## 2015-03-15 ENCOUNTER — Other Ambulatory Visit: Payer: Self-pay | Admitting: Neurosurgery

## 2015-03-15 DIAGNOSIS — G8929 Other chronic pain: Secondary | ICD-10-CM

## 2015-03-15 DIAGNOSIS — M545 Low back pain: Principal | ICD-10-CM

## 2015-03-18 ENCOUNTER — Ambulatory Visit
Admission: RE | Admit: 2015-03-18 | Discharge: 2015-03-18 | Disposition: A | Discharge status: Home / Self Care | Payer: Medicare Other | Source: Ambulatory Visit | Attending: Neurosurgery | Admitting: Neurosurgery

## 2015-03-18 ENCOUNTER — Other Ambulatory Visit: Payer: Self-pay | Admitting: Neurosurgery

## 2015-03-18 DIAGNOSIS — M545 Low back pain, unspecified: Secondary | ICD-10-CM

## 2015-03-18 DIAGNOSIS — G8929 Other chronic pain: Secondary | ICD-10-CM

## 2015-03-18 MED ORDER — IOHEXOL 180 MG/ML  SOLN
1.0000 mL | Freq: Once | INTRAMUSCULAR | Status: DC | PRN
  Administered 2015-03-18: 1 mL via INTRA_ARTICULAR

## 2015-03-18 MED ORDER — METHYLPREDNISOLONE ACETATE 40 MG/ML INJ SUSP (RADIOLOG
120.0000 mg | Freq: Once | INTRAMUSCULAR | Status: AC
  Administered 2015-03-18: 120 mg via INTRA_ARTICULAR

## 2015-04-10 ENCOUNTER — Encounter: Payer: Self-pay | Admitting: Gastroenterology

## 2015-04-28 ENCOUNTER — Other Ambulatory Visit: Payer: Self-pay | Admitting: Internal Medicine

## 2015-08-19 ENCOUNTER — Other Ambulatory Visit: Payer: Self-pay | Admitting: Neurosurgery

## 2015-08-19 DIAGNOSIS — M47816 Spondylosis without myelopathy or radiculopathy, lumbar region: Secondary | ICD-10-CM

## 2015-08-20 ENCOUNTER — Other Ambulatory Visit: Payer: Self-pay | Admitting: Neurosurgery

## 2015-08-20 DIAGNOSIS — M48061 Spinal stenosis, lumbar region without neurogenic claudication: Secondary | ICD-10-CM

## 2015-08-21 ENCOUNTER — Ambulatory Visit
Admission: RE | Admit: 2015-08-21 | Discharge: 2015-08-21 | Disposition: A | Discharge status: Home / Self Care | Payer: Medicare Other | Source: Ambulatory Visit | Attending: Neurosurgery | Admitting: Neurosurgery

## 2015-08-21 ENCOUNTER — Other Ambulatory Visit: Payer: Self-pay | Admitting: Neurosurgery

## 2015-08-21 DIAGNOSIS — M47816 Spondylosis without myelopathy or radiculopathy, lumbar region: Secondary | ICD-10-CM

## 2015-08-21 DIAGNOSIS — M48061 Spinal stenosis, lumbar region without neurogenic claudication: Secondary | ICD-10-CM

## 2015-08-21 MED ORDER — IOHEXOL 180 MG/ML  SOLN
1.0000 mL | Freq: Once | INTRAMUSCULAR | Status: AC | PRN
  Administered 2015-08-21: 1 mL via INTRA_ARTICULAR

## 2015-08-21 MED ORDER — METHYLPREDNISOLONE ACETATE 40 MG/ML INJ SUSP (RADIOLOG
120.0000 mg | Freq: Once | INTRAMUSCULAR | Status: AC
  Administered 2015-08-21: 120 mg via INTRA_ARTICULAR

## 2015-12-30 ENCOUNTER — Other Ambulatory Visit: Payer: Self-pay | Admitting: Internal Medicine

## 2016-04-23 ENCOUNTER — Other Ambulatory Visit: Payer: Self-pay | Admitting: Internal Medicine

## 2016-06-19 ENCOUNTER — Other Ambulatory Visit: Payer: Self-pay | Admitting: Internal Medicine

## 2016-08-26 DIAGNOSIS — J310 Chronic rhinitis: Secondary | ICD-10-CM | POA: Diagnosis not present

## 2016-09-08 ENCOUNTER — Other Ambulatory Visit: Payer: Self-pay | Admitting: Internal Medicine

## 2016-10-13 DIAGNOSIS — Z125 Encounter for screening for malignant neoplasm of prostate: Secondary | ICD-10-CM | POA: Diagnosis not present

## 2016-10-13 DIAGNOSIS — R7301 Impaired fasting glucose: Secondary | ICD-10-CM | POA: Diagnosis not present

## 2016-10-13 DIAGNOSIS — E784 Other hyperlipidemia: Secondary | ICD-10-CM | POA: Diagnosis not present

## 2016-10-20 DIAGNOSIS — K219 Gastro-esophageal reflux disease without esophagitis: Secondary | ICD-10-CM | POA: Diagnosis not present

## 2016-10-20 DIAGNOSIS — Z23 Encounter for immunization: Secondary | ICD-10-CM | POA: Diagnosis not present

## 2016-10-20 DIAGNOSIS — J302 Other seasonal allergic rhinitis: Secondary | ICD-10-CM | POA: Diagnosis not present

## 2016-10-20 DIAGNOSIS — Z1389 Encounter for screening for other disorder: Secondary | ICD-10-CM | POA: Diagnosis not present

## 2016-10-20 DIAGNOSIS — M1990 Unspecified osteoarthritis, unspecified site: Secondary | ICD-10-CM | POA: Diagnosis not present

## 2016-10-20 DIAGNOSIS — J328 Other chronic sinusitis: Secondary | ICD-10-CM | POA: Diagnosis not present

## 2016-10-20 DIAGNOSIS — K4090 Unilateral inguinal hernia, without obstruction or gangrene, not specified as recurrent: Secondary | ICD-10-CM | POA: Diagnosis not present

## 2016-10-20 DIAGNOSIS — E784 Other hyperlipidemia: Secondary | ICD-10-CM | POA: Diagnosis not present

## 2016-10-20 DIAGNOSIS — K589 Irritable bowel syndrome without diarrhea: Secondary | ICD-10-CM | POA: Diagnosis not present

## 2016-10-20 DIAGNOSIS — N528 Other male erectile dysfunction: Secondary | ICD-10-CM | POA: Diagnosis not present

## 2016-10-20 DIAGNOSIS — R7301 Impaired fasting glucose: Secondary | ICD-10-CM | POA: Diagnosis not present

## 2016-10-20 DIAGNOSIS — Z0000 Encounter for general adult medical examination without abnormal findings: Secondary | ICD-10-CM | POA: Diagnosis not present

## 2016-10-20 DIAGNOSIS — Z6827 Body mass index (BMI) 27.0-27.9, adult: Secondary | ICD-10-CM | POA: Diagnosis not present

## 2016-10-22 DIAGNOSIS — Z1212 Encounter for screening for malignant neoplasm of rectum: Secondary | ICD-10-CM | POA: Diagnosis not present

## 2017-02-17 ENCOUNTER — Ambulatory Visit
Admission: RE | Admit: 2017-02-17 | Discharge: 2017-02-17 | Disposition: A | Discharge status: Home / Self Care | Payer: PPO | Source: Ambulatory Visit | Attending: Internal Medicine | Admitting: Internal Medicine

## 2017-02-17 ENCOUNTER — Other Ambulatory Visit: Payer: Self-pay | Admitting: Internal Medicine

## 2017-02-17 DIAGNOSIS — R0609 Other forms of dyspnea: Secondary | ICD-10-CM

## 2017-02-17 DIAGNOSIS — R0602 Shortness of breath: Secondary | ICD-10-CM

## 2017-02-17 DIAGNOSIS — R0600 Dyspnea, unspecified: Secondary | ICD-10-CM

## 2017-02-17 DIAGNOSIS — J8410 Pulmonary fibrosis, unspecified: Secondary | ICD-10-CM

## 2017-02-19 ENCOUNTER — Telehealth: Payer: Self-pay | Admitting: Cardiovascular Disease

## 2017-02-19 ENCOUNTER — Telehealth: Payer: Self-pay | Admitting: Internal Medicine

## 2017-02-19 DIAGNOSIS — R7301 Impaired fasting glucose: Secondary | ICD-10-CM | POA: Diagnosis not present

## 2017-02-19 DIAGNOSIS — Z6827 Body mass index (BMI) 27.0-27.9, adult: Secondary | ICD-10-CM | POA: Diagnosis not present

## 2017-02-19 DIAGNOSIS — J8410 Pulmonary fibrosis, unspecified: Secondary | ICD-10-CM | POA: Diagnosis not present

## 2017-02-19 DIAGNOSIS — I2510 Atherosclerotic heart disease of native coronary artery without angina pectoris: Secondary | ICD-10-CM | POA: Diagnosis not present

## 2017-02-19 DIAGNOSIS — E784 Other hyperlipidemia: Secondary | ICD-10-CM | POA: Diagnosis not present

## 2017-02-19 DIAGNOSIS — R0602 Shortness of breath: Secondary | ICD-10-CM

## 2017-02-19 DIAGNOSIS — R0609 Other forms of dyspnea: Secondary | ICD-10-CM | POA: Diagnosis not present

## 2017-02-19 NOTE — Telephone Encounter (Signed)
MR please advise once you have seen this message.  [place] would like this pt seen within 3 week with you.  Thanks

## 2017-02-19 NOTE — Telephone Encounter (Signed)
New Message    New patient request for Dr Burt Knack pt has CAD Diolated Aorta Shortness of breath and Lung Fibrosis, Dr Joylene Draft only wants Dr Burt Knack , I offer new patient appt for PA

## 2017-02-19 NOTE — Telephone Encounter (Signed)
Follow up    Disregard they let me put pt with Dr Stanford Breed.

## 2017-02-22 NOTE — Telephone Encounter (Signed)
Please work wth Kenneth [name] to see if he can be seen 03/04/17 = last appointment for the morning say 12.30pm or first appt for pm 1.30pm. If that day wont work, I can look at some extra slots this week. Prior to appt he needs  Pre-bd spiro and dlco only. No lung volume or bd response. No post-bd spiro - this can be done on day of or anytime leading up to appt  Serum: ESR, ACE, ANA, DS-DNA, RF, anti-CCP, ssA, ssB, scl-70, ANCA screen, MPO, PR-3, Total CK,  RNP, Aldolase,  Hypersensitivity Pneumonitis Panel - this should be done ending 3 days  days prior to appt   Dr. Brand Males, M.D., [facility].C.P Pulmonary and Critical Care Medicine Staff Physician [place] [facility] Pager: [phone], If no answer or between  15:00h - 7:00h: call [phone]  02/22/2017 5:48 PM

## 2017-02-23 NOTE — Telephone Encounter (Signed)
EE please advise. Thanks

## 2017-02-23 NOTE — Telephone Encounter (Signed)
Dr. [name] (patient) [phone] returned call. He stated that he could come in for appt on 03/04/2017 at 12:30 or 1:30 and also is aware Dr. [name] would like for him to have PFT before appt or same day. Schedule is not open for MR on 08/30 at those times and Sharl Ma is not in the office this afternoon, so I could not schedule this for him.  Daneil Dan, please call him back to schedule these appointments.  Thank you.  [place]

## 2017-02-24 ENCOUNTER — Encounter: Payer: Self-pay | Admitting: Cardiovascular Disease

## 2017-02-24 ENCOUNTER — Ambulatory Visit (INDEPENDENT_AMBULATORY_CARE_PROVIDER_SITE_OTHER): Payer: PPO | Admitting: Cardiovascular Disease

## 2017-02-24 VITALS — BP 132/80 | HR 81 | Ht 70.0 in | Wt 186.1 lb

## 2017-02-24 DIAGNOSIS — I2510 Atherosclerotic heart disease of native coronary artery without angina pectoris: Secondary | ICD-10-CM | POA: Diagnosis not present

## 2017-02-24 DIAGNOSIS — R0609 Other forms of dyspnea: Secondary | ICD-10-CM

## 2017-02-24 DIAGNOSIS — R0600 Dyspnea, unspecified: Secondary | ICD-10-CM

## 2017-02-24 NOTE — Patient Instructions (Signed)
Medication Instructions:  Your physician recommends that you continue on your current medications as directed. Please refer to the Current Medication list given to you today.   Labwork: none  Testing/Procedures: Your physician has requested that you have an echocardiogram. Echocardiography is a painless test that uses sound waves to create images of your heart. It provides your doctor with information about the size and shape of your heart and how well your heart's chambers and valves are working. This procedure takes approximately one hour. There are no restrictions for this procedure.  Your physician has requested that you have an exercise stress myoview. For further information please visit [email or web address]. Please follow instruction sheet, as given.   Follow-Up: Your physician recommends that you schedule a follow-up appointment in: 3-4 weeks.  Scheduled for September 5,2018 at 3:20    Any Other Special Instructions Will Be Listed Below (If Applicable).     If you need a refill on your cardiac medications before your next appointment, please call your pharmacy.

## 2017-02-24 NOTE — Telephone Encounter (Signed)
Called and spoke to pt. Informed him of the recs per MR. Appt made for PFT and OV on 8/30. Pt verbalized understanding and denied any further questions or concerns at this time.

## 2017-02-24 NOTE — Telephone Encounter (Signed)
EE please advise if the schedule will be opened for MR on this date for this appt. Thanks

## 2017-02-24 NOTE — Progress Notes (Signed)
Chief Complaint  Patient presents with  . New Patient (Initial Visit)    CAD, dyspnea    History of Present Illness: 76 yo male with history of GERD, hiatal hernia, irritable bowel syndrome and hyperlipidemia who is referred today as a new consult by Dr. Joylene Draft for the evaluation of dyspnea and abnormal chest CT with evidence of CAD. 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 He has no prior cardiac history. He had a chest x-ray as part of screening and this was abnormal. This led to a high res CT chest which showed calcification in the coronary arteries as well as mild dilation of the aortic root (4.1 cm) and prominent right and left pulmonary arteries. He tells me that he has dyspnea with exertion, mainly when climbing stairs. He has no chest pain. He plays golf several days per week. He exercises every day. He has no LE edema, palpitations, chest pain, dizziness, near syncope or syncope.   Primary Care Physician: [name], MD   Past Medical History:  Diagnosis Date  . Arthritis   . Diverticulosis of colon (without mention of hemorrhage)   . GERD (gastroesophageal reflux disease)   . Hiatal hernia   . HLD (hyperlipidemia)   . Internal hemorrhoids without mention of complication   . Irritable bowel syndrome   . Other specified gastritis without mention of hemorrhage     Past Surgical History:  Procedure Laterality Date  . CARPAL TUNNEL RELEASE     left  . CATARACT EXTRACTION  06/2011   bilateral  . [place]   x 2  . LUMBAR LAMINECTOMY/DECOMPRESSION MICRODISCECTOMY Left 07/11/2013   Procedure: LUMBAR ONE TO TWO, LUMBAR TWO TO THREE, LUMBAR THREE TO FOUR LUMBAR LAMINECTOMY/DECOMPRESSION MICRODISCECTOMY 3 LEVELS;  Surgeon: Charlie Pitter, MD;  Location: [place];  Service: Neurosurgery;  Laterality: Left;    Current Outpatient Prescriptions  Medication Sig Dispense Refill  . acetaminophen-codeine (TYLENOL #3) 300-30 MG per tablet Take 1 tablet by mouth every  8 (eight) hours as needed for moderate pain.     . carisoprodol (SOMA) 350 MG tablet Take 175 mg by mouth 3 (three) times daily as needed for muscle spasms.     . Cholecalciferol (VITAMIN D-3) 1000 UNITS CAPS Take 1,000 Units by mouth daily.    . clidinium-chlordiazePOXIDE (LIBRAX) 5-2.5 MG per capsule TAKE 1 CAPSULE 3 TIMES A DAY 100 capsule 2  . CO-ENZYME Q-10 PO Take 1 tablet by mouth daily.    Marland Kitchen dicyclomine (BENTYL) 10 MG capsule Take one by mouth every 6 hours as needed for abdominal cramping 30 capsule 1  . diphenoxylate-atropine (LOMOTIL) 2.5-0.025 MG tablet Take 1 tablet by mouth daily as needed.    Marland Kitchen esomeprazole (NEXIUM) 40 MG capsule Take 40 mg by mouth every evening.     . fexofenadine (ALLEGRA) 30 MG tablet Take 30 mg by mouth daily as needed (allergies).     . fluticasone (FLONASE) 50 MCG/ACT nasal spray Place 2 sprays into the nose daily as needed for allergies.     . Glucosamine-Chondroitin (GLUCOSAMINE CHONDR COMPLEX PO) Take 1 tablet by mouth daily.    Marland Kitchen Hyaluronic Acid-Vitamin C (HYALURONIC ACID PO) Take 1 tablet by mouth daily.    Marland Kitchen HYDROcodone-acetaminophen (NORCO/VICODIN) 5-325 MG per tablet Take 1-2 tablets by mouth every 6 (six) hours as needed for moderate pain.    . meloxicam (MOBIC) 7.5 MG tablet Take 7.5 mg by mouth daily.    . methocarbamol (  ROBAXIN) 500 MG tablet Take 1 tablet (500 mg total) by mouth every 8 (eight) hours as needed for muscle spasms. 40 tablet 0  . Probiotic Product (ALIGN) 4 MG CAPS Take 1 tablet by mouth daily.    Marland Kitchen rOPINIRole (REQUIP) 0.5 MG tablet Take 0.5 mg by mouth at bedtime.    . simvastatin (ZOCOR) 20 MG tablet Take 20 mg by mouth every evening.     No current facility-administered medications for this visit.     No Known Allergies  Social History   Social History  . Marital status: Married    Spouse name: N/A  . Number of children: 2  . Years of education: N/A   Occupational History  . PHYCISIAN Retired   Social History Main  Topics  . Smoking status: Never Smoker  . Smokeless tobacco: Never Used     Comment: social  . Alcohol use Yes     Comment: 6 per week  . Drug use: No  . Sexual activity: Not on file   Other Topics Concern  . Not on file   Social History Narrative  . No narrative on file    Family History  Problem Relation Age of Onset  . Heart failure Father 23  . Colon cancer Neg Hx   . Pancreatic cancer Neg Hx   . Rectal cancer Neg Hx   . Stomach cancer Neg Hx     Review of Systems:  As stated in the HPI and otherwise negative.   BP 132/80   Pulse 81   Ht _0  (1.778 m)   Wt 186 lb 1.9 oz (84.4 kg)   SpO2 96%   BMI 26.71 kg/m   Physical Examination: General: Well developed, well nourished, NAD  HEENT: OP clear, mucus membranes moist  SKIN: warm, dry. No rashes. Neuro: No focal deficits  Musculoskeletal: Muscle strength 5/5 all ext  Psychiatric: Mood and affect normal  Neck: No JVD, no carotid bruits, no thyromegaly, no lymphadenopathy.  Lungs:Clear bilaterally, no wheezes, rhonci, crackles Cardiovascular: Regular rate and rhythm. No murmurs, gallops or rubs. Abdomen:Soft. Bowel sounds present. Non-tender.  Extremities: No lower extremity edema. Pulses are 2 + in the bilateral DP/PT.  EKG:  EKG is ordered today. The ekg ordered today demonstrates NSR, rate 82 bpm.   Recent Labs: No results found for requested labs within last 8760 hours.   Lipid Panel No results found for: CHOL, TRIG, HDL, CHOLHDL, VLDL, LDLCALC, LDLDIRECT   Wt Readings from Last 3 Encounters:  02/24/17 186 lb 1.9 oz (84.4 kg)  10/18/13 182 lb (82.6 kg)  10/17/13 182 lb 9.6 oz (82.8 kg)     Other studies Reviewed: Additional studies/ records that were reviewed today include: CT scan report, primary care office notes Review of the above records demonstrates:   Assessment and Plan:   1. CAD: He has evidence of CAD on CT scan which shows calcification in the coronary arteries. He has findings of  possible interstitial lung disease as well. His dyspnea may be an anginal equivalent. I think ischemic testing is indicated given his age and symptoms as well as findings on the chest CT. We have discussed cardiac cath to assess PA pressures and exclude obstructive CAD vs non-invasive testing. He prefers to go the route of stress testing to start. I will arrange an exercise nuclear stress test to exclude ischemia and an echocardiogram to assess LVEF, exclude structural heart disease.   Current medicines are reviewed at length with the patient  today.  The patient does not have concerns regarding medicines.  The following changes have been made:  no change  Labs/ tests ordered today include: nuclear stress test and echo  Orders Placed This Encounter  Procedures  . Myocardial Perfusion Imaging  . EKG 12-Lead  . ECHOCARDIOGRAM COMPLETE     Disposition:   FU with me in 3 weeks   Signed, Lauree Chandler, MD 02/24/2017 10:21 AM    [facility] [place], [place],   [address] Phone: [phone]; Fax: [phone]

## 2017-02-25 ENCOUNTER — Telehealth (HOSPITAL_COMMUNITY): Payer: Self-pay | Admitting: *Deleted

## 2017-02-25 NOTE — Telephone Encounter (Signed)
Attempted to call patient regarding upcoming appointment phone busy x 2.  Kenneth [name]

## 2017-03-02 ENCOUNTER — Ambulatory Visit (HOSPITAL_COMMUNITY): Payer: PPO | Attending: Cardiology

## 2017-03-02 ENCOUNTER — Other Ambulatory Visit: Payer: Self-pay

## 2017-03-02 ENCOUNTER — Ambulatory Visit (HOSPITAL_BASED_OUTPATIENT_CLINIC_OR_DEPARTMENT_OTHER): Payer: PPO

## 2017-03-02 DIAGNOSIS — E785 Hyperlipidemia, unspecified: Secondary | ICD-10-CM | POA: Insufficient documentation

## 2017-03-02 DIAGNOSIS — I2510 Atherosclerotic heart disease of native coronary artery without angina pectoris: Secondary | ICD-10-CM | POA: Diagnosis not present

## 2017-03-02 DIAGNOSIS — I517 Cardiomegaly: Secondary | ICD-10-CM | POA: Diagnosis not present

## 2017-03-02 DIAGNOSIS — R0600 Dyspnea, unspecified: Secondary | ICD-10-CM

## 2017-03-02 DIAGNOSIS — R0609 Other forms of dyspnea: Secondary | ICD-10-CM

## 2017-03-02 LAB — MYOCARDIAL PERFUSION IMAGING
Estimated workload: 7 METS
Exercise duration (min): 6 min
Exercise duration (sec): 0 s
LV dias vol: 92 mL (ref 62–150)
LV sys vol: 35 mL
MPHR: 145 {beats}/min
Peak HR: 126 {beats}/min
Percent HR: 86 %
RATE: 0.33
Rest HR: 78 {beats}/min
SDS: 4
SRS: 1
SSS: 5
TID: 0.84

## 2017-03-02 MED ORDER — TECHNETIUM TC 99M TETROFOSMIN IV KIT
10.4000 | PACK | Freq: Once | INTRAVENOUS | Status: AC | PRN
  Administered 2017-03-02: 10.4 via INTRAVENOUS
  Filled 2017-03-02: qty 11

## 2017-03-02 MED ORDER — TECHNETIUM TC 99M TETROFOSMIN IV KIT
31.6000 | PACK | Freq: Once | INTRAVENOUS | Status: AC | PRN
  Administered 2017-03-02: 31.6 via INTRAVENOUS
  Filled 2017-03-02: qty 32

## 2017-03-04 ENCOUNTER — Ambulatory Visit (INDEPENDENT_AMBULATORY_CARE_PROVIDER_SITE_OTHER): Payer: PPO | Admitting: Internal Medicine

## 2017-03-04 ENCOUNTER — Telehealth: Payer: Self-pay | Admitting: Internal Medicine

## 2017-03-04 ENCOUNTER — Encounter: Payer: Self-pay | Admitting: Internal Medicine

## 2017-03-04 ENCOUNTER — Other Ambulatory Visit (INDEPENDENT_AMBULATORY_CARE_PROVIDER_SITE_OTHER): Payer: PPO

## 2017-03-04 VITALS — BP 136/72 | HR 94 | Ht 70.0 in | Wt 186.0 lb

## 2017-03-04 DIAGNOSIS — Z8719 Personal history of other diseases of the digestive system: Secondary | ICD-10-CM

## 2017-03-04 DIAGNOSIS — J849 Interstitial pulmonary disease, unspecified: Secondary | ICD-10-CM | POA: Diagnosis not present

## 2017-03-04 DIAGNOSIS — Z889 Allergy status to unspecified drugs, medicaments and biological substances status: Secondary | ICD-10-CM | POA: Diagnosis not present

## 2017-03-04 DIAGNOSIS — R0602 Shortness of breath: Secondary | ICD-10-CM

## 2017-03-04 DIAGNOSIS — Z923 Personal history of irradiation: Secondary | ICD-10-CM | POA: Diagnosis not present

## 2017-03-04 LAB — PULMONARY FUNCTION TEST
DL/VA % pred: 89 %
DL/VA: 4.07 ml/min/mmHg/L
DLCO cor % pred: 63 %
DLCO cor: 20.06 ml/min/mmHg
DLCO unc % pred: 63 %
DLCO unc: 20.12 ml/min/mmHg
FEF 25-75 Pre: 3.67 L/sec
FEF2575-%Pred-Pre: 169 %
FEV1-%Pred-Pre: 100 %
FEV1-Pre: 3.01 L
FEV1FVC-%Pred-Pre: 116 %
FEV6-%Pred-Pre: 92 %
FEV6-Pre: 3.57 L
FEV6FVC-%Pred-Pre: 106 %
FVC-%Pred-Pre: 86 %
FVC-Pre: 3.59 L
Pre FEV1/FVC ratio: 84 %
Pre FEV6/FVC Ratio: 100 %

## 2017-03-04 LAB — SEDIMENTATION RATE: Sed Rate: 1 mm/hr (ref 0–20)

## 2017-03-04 LAB — CARDIAC PANEL
CK-MB: 3.1 ng/mL (ref 0.3–4.0)
Relative Index: 2.1 calc (ref 0.0–2.5)
Total CK: 150 U/L (ref 7–232)

## 2017-03-04 NOTE — Progress Notes (Signed)
PFT done today. 

## 2017-03-04 NOTE — Patient Instructions (Signed)
ICD-10-CM   1. Shortness of breath R06.02   2. ILD (interstitial lung disease) ([place]) J84.9   3. History of seasonal allergies Z88.9   4. History of gastroesophageal reflux (GERD) Z87.19   5. Personal history of radiation exposure Z92.3    Do ACCP ILD question and give it to my nurse . 03/04/2017   Do autoimmune lab panel  - Serum: ESR, ACE, ANA, DS-DNA, RF, anti-CCP, ssA, ssB, scl-70, ANCA screen, MPO, PR-3, Total CK,  RNP, Aldolase,  Hypersensitivity Pneumonitis Panel  Do blood allergy, CBC with Diff and IgE blood work  Take [employer] Fibrosis booklet   FOllowup Next few weeks - to regrup. Double book last appt for morning or a 1.30pm appt

## 2017-03-04 NOTE — Progress Notes (Signed)
Subjective:    Patient ID: Kenneth [name], male    DOB: 1940-11-25, 76 y.o.   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 He tells me that this summer 2018*noticing insidious onset of shortness of breath particularly with walking stairs in the house or [place]. This was not that in the previous years. The summer 2018 he was in [place] but did not notice much shortness of breath. Otherwise he is able to do his activities of daily living and played golf with a car. He is more bothered by his back pain and sciatica as a result of previous back surgery. Shortness of breath is extremely mild. He says he became more aware of it after the radiologic investigations documented below. He had a chest x-ray that suggested interstitial findings and therefore he underwent a high-resolution CT scan of the chest that is described below. I personally visualized this high resolution CT chest and to me it shows bilateral bibasal subpleural reticulation that also extends to the upper lobes without any zonal predominance. There is no obvious honeycombing but there is traction bronchiectasis. There no mediastinal adenopathy. Therefore he is been referred here. In terms of exposure history other than radiation exposure he has not been on any pulmonary toxic drugs or any mold exposure or asbestos exposure. Does have GERD and is on PPI and is well ocntrolled   Does have spring and perennial allergies - sneezes easy. Allergy test with [facility] negative some years ago  [employer] chest physicians interstitial lung disease questionnaire: He says that he does not cough. He is only trouble with dyspnea with strenuous exercise and it started 4 months ago. Past medical history  significant for acid reflux. He has never smoked any tobacco or street drugs. Does not have any family history of lung disease. At home he does not have any humidifier sound birds heart the water damage or mold. 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 He has standard radiation exposure while at work. The standard radiation for a radiologist. He does not take any pulmonary toxic drugs   Pulmonary function test shows isolated reduction in diffusion capacity to 62%. Walking desaturation test underneath her feet 3 laps on room air: Resting pulse ox 96%. Final pulse ox 95%. Heart rate resting was 92/m and went up to 109 minute.   Lungs/Pleura: There is subpleural reticulation with scattered ground-glass and traction bronchiolectasis, without a definite zonal predominance. No definitive honeycombing. Findings appear mildly progressive when radiographs dating back to 12/28/2008 are reviewed. Probable 5 mm subpleural lymph node along the right major fissure. No pleural fluid. Airway is unremarkable.      IMPRESSION: 1. Pulmonary parenchymal findings of interstitial lung disease which may be due to fibrotic nonspecific interstitial pneumonitis or, given slight progression over time, usual interstitial pneumonitis. 2. Aortic atherosclerosis (ICD10-170.0). Coronary artery calcification. 3. Aortic aneurysm NOS (ICD10-I71.9). 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 Recommend annual imaging followup by CTA or MRA. This recommendation follows 2010 ACCF/AHA/AATS/ACR/ASA/SCA/SCAI/SIR/STS/SVM Guidelines for the Diagnosis and Management of Patients with Thoracic Aortic Disease. Circulation. 2010; 121: F818-E993. 4. Prominence of the right and left main pulmonary arteries can be seen with  pulmonary arterial hypertension.   Electronically Signed   By: [name]   On: 02/17/2017 13:41      Review of Systems  Constitutional:  Negative for fever and unexpected weight change.  HENT: Negative for congestion, dental problem, ear pain, nosebleeds, postnasal drip, rhinorrhea, sinus pressure, sneezing, sore throat and trouble swallowing.   Eyes: Negative for redness and itching.  Respiratory: Negative for cough, chest tightness, shortness of breath and wheezing.   Cardiovascular: Negative for palpitations and leg swelling.  Gastrointestinal: Negative for nausea and vomiting.  Genitourinary: Negative for dysuria.  Musculoskeletal: Negative for joint swelling.  Skin: Negative for rash.  Neurological: Negative for headaches.  Hematological: Does not bruise/bleed easily.  Psychiatric/Behavioral: Negative for dysphoric mood. The patient is not nervous/anxious.        Objective:   Physical Exam  Constitutional: He is oriented to person, place, and time. He appears well-developed and well-nourished. No distress.  HENT:  Head: Normocephalic and atraumatic.  Right Ear: External ear normal.  Left Ear: External ear normal.  Mouth/Throat: Oropharynx is clear and moist. No oropharyngeal exudate.  No clubbing or evidence of autoimmune disease  Eyes: Pupils are equal, round, and reactive to light. Conjunctivae and EOM are normal. Right eye exhibits no discharge. Left eye exhibits no discharge. No scleral icterus.  Neck: Normal range of motion. Neck supple. No JVD present. No tracheal deviation present. No thyromegaly present.  Cardiovascular: Normal rate, regular rhythm and intact distal pulses.  Exam reveals no gallop and no friction rub.   No murmur heard. Pulmonary/Chest: Effort normal and breath sounds normal. No respiratory distress. He has no wheezes. He has no rales. He exhibits no tenderness.  I cannot even discern crackles all that well  Abdominal: Soft. Bowel sounds are normal. He exhibits no distension and no mass. There is no tenderness. There is no rebound and no guarding.  Musculoskeletal: Normal range of motion.  He exhibits no edema or tenderness.  Lymphadenopathy:    He has no cervical adenopathy.  Neurological: He is alert and oriented to person, place, and time. He has normal reflexes. No cranial nerve deficit. Coordination normal.  Skin: Skin is warm and dry. No rash noted. He is not diaphoretic. No erythema. No pallor.  Psychiatric: He has a normal mood and affect. His behavior is normal. Judgment and thought content normal.  Nursing note and vitals reviewed.   Vitals:   03/04/17 1247  BP: 136/72  Pulse: 94  SpO2: 98%  Weight: 186 lb (84.4 kg)  Height: 5' 10" (1.778 m)         Assessment & Plan:     ICD-10-CM   1. Shortness of breath R06.02   2. ILD (interstitial lung disease) ([place]) J84.9   3. History of seasonal allergies Z88.9   4. History of gastroesophageal reflux (GERD) Z87.19   5. 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 03/04/2017   Do autoimmune lab panel  -  Serum: ESR, ACE, ANA, DS-DNA, RF, anti-CCP, ssA, ssB, scl-70, ANCA screen, MPO, PR-3, Total CK,  RNP, Aldolase,  Hypersensitivity Pneumonitis Panel  Do blood allergy, CBC with Diff and IgE blood work  Take [employer] Fibrosis booklet   FOllowup Next few weeks - to regrup - discussed decision on surgical lung biopsy which I doubt he will need versus making a diagnosis of autoimmune disease versus making a diagnosis of IPF and discussing  diagnosis, prognosis and management and anti-fibrotic therapy and other pillars of management    He is agreeable with the plan   Dr. Brand Males, M.D., [facility].C.P Pulmonary and Critical Care Medicine Staff Physician [place] [facility] Pager: [phone], If no answer or between  15:00h - 7:00h: call [phone]  03/04/2017 1:26 PM     . Double book last appt for morning or a 1.30pm appt

## 2017-03-04 NOTE — Telephone Encounter (Signed)
Kenneth [name]  I need his ACCP ILD questionnair that he answered - please leave on the [facility] on Friday 03/04/2017   Dr. Brand Males, M.D., F.C.C.P Pulmonary and Critical Care Medicine Staff Physician [facility] Pager: [phone], If no answer or between  15:00h - 7:00h: call [phone]  03/04/2017 11:20 PM

## 2017-03-05 LAB — RESPIRATORY ALLERGY PROFILE REGION II ~~LOC~~
Allergen, A. alternata, m6: <0.1 kU/L
Allergen, C. Herbarum, M2: <0.1 kU/L
Allergen, Cedar tree, t12: <0.1 kU/L
Allergen, Comm Silver Birch, t9: <0.1 kU/L
Allergen, Cottonwood, t14: <0.1 kU/L
Allergen, D pternoyssinus,d7: 0.89 kU/L — ABNORMAL HIGH
Allergen, Mouse Urine Protein, e78: <0.1 kU/L
Allergen, Mulberry, t76: <0.1 kU/L
Allergen, Oak,t7: <0.1 kU/L
Allergen, P. notatum, m1: <0.1 kU/L
Aspergillus fumigatus, m3: <0.1 kU/L
Bermuda Grass: <0.1 kU/L
Box Elder IgE: <0.1 kU/L
Cat Dander: <0.1 kU/L
Cockroach: <0.1 kU/L
Common Ragweed: <0.1 kU/L
D. farinae: 0.99 kU/L — ABNORMAL HIGH
Dog Dander: 0.78 kU/L — ABNORMAL HIGH
Elm IgE: <0.1 kU/L
IgE (Immunoglobulin E), Serum: 31 kU/L (ref <115)
Johnson Grass: <0.1 kU/L
Pecan/Hickory Tree IgE: <0.1 kU/L
Rough Pigweed  IgE: <0.1 kU/L
Sheep Sorrel IgE: <0.1 kU/L
Timothy Grass: 0.88 kU/L — ABNORMAL HIGH

## 2017-03-05 LAB — ANGIOTENSIN CONVERTING ENZYME: Angiotensin-Converting Enzyme: 31 U/L (ref 9–67)

## 2017-03-05 LAB — CYCLIC CITRUL PEPTIDE ANTIBODY, IGG: Cyclic Citrullin Peptide Ab: <16 Units

## 2017-03-05 LAB — SJOGRENS SYNDROME-B EXTRACTABLE NUCLEAR ANTIBODY: SSB (La) (ENA) Antibody, IgG: <1

## 2017-03-05 LAB — RHEUMATOID FACTOR: Rhuematoid fact SerPl-aCnc: <14 IU/mL (ref <14)

## 2017-03-05 LAB — ANTI-DNA ANTIBODY, DOUBLE-STRANDED: ds DNA Ab: <1 IU/mL

## 2017-03-05 LAB — SJOGRENS SYNDROME-A EXTRACTABLE NUCLEAR ANTIBODY: SSA (Ro) (ENA) Antibody, IgG: <1

## 2017-03-05 LAB — RNP ANTIBODIES: ENA RNP Ab: 0.2 AI (ref 0.0–0.9)

## 2017-03-05 LAB — ANA: Anti Nuclear Antibody(ANA): NEGATIVE

## 2017-03-09 LAB — ALDOLASE: Aldolase: 5 U/L (ref <=8.1)

## 2017-03-10 ENCOUNTER — Encounter: Payer: Self-pay | Admitting: Cardiovascular Disease

## 2017-03-10 ENCOUNTER — Ambulatory Visit (INDEPENDENT_AMBULATORY_CARE_PROVIDER_SITE_OTHER): Payer: PPO | Admitting: Cardiovascular Disease

## 2017-03-10 VITALS — BP 112/70 | HR 87 | Ht 70.0 in | Wt 186.0 lb

## 2017-03-10 DIAGNOSIS — I2510 Atherosclerotic heart disease of native coronary artery without angina pectoris: Secondary | ICD-10-CM | POA: Diagnosis not present

## 2017-03-10 DIAGNOSIS — D1801 Hemangioma of skin and subcutaneous tissue: Secondary | ICD-10-CM | POA: Diagnosis not present

## 2017-03-10 DIAGNOSIS — R0600 Dyspnea, unspecified: Secondary | ICD-10-CM

## 2017-03-10 DIAGNOSIS — L821 Other seborrheic keratosis: Secondary | ICD-10-CM | POA: Diagnosis not present

## 2017-03-10 DIAGNOSIS — R0609 Other forms of dyspnea: Secondary | ICD-10-CM | POA: Diagnosis not present

## 2017-03-10 DIAGNOSIS — Z85828 Personal history of other malignant neoplasm of skin: Secondary | ICD-10-CM | POA: Diagnosis not present

## 2017-03-10 LAB — HYPERSENSITIVITY PNUEMONITIS PROFILE

## 2017-03-10 NOTE — Progress Notes (Signed)
Chief Complaint  Patient presents with  . Follow-up    CAD    History of Present Illness: 76 yo male with history of GERD, hiatal hernia, irritable bowel syndrome and hyperlipidemia who is here today for cardiac follow up. I saw him as a new consult on 02/24/17 for the evaluation of dyspnea and abnormal chest CT with evidence of CAD. 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 He has no prior cardiac history. He had a chest x-ray as part of screening and this was abnormal. This led to a high res CT chest which showed calcification in the coronary arteries as well as mild dilation of the aortic root (4.1 cm) and prominent right and left pulmonary arteries. He told me that he has dyspnea with exertion, mainly when climbing stairs. He has no chest pain. He plays golf several days per week. He exercises every day. He has no LE edema, palpitations, chest pain, dizziness, near syncope or syncope. We discussed stress testing vs cath at his initial appointment and he preferred to go with non-invasive testing. Nuclear stress test 03/02/17 showed no ischemia. Echo 03/02/17 with normal LV systolic function, no significant valve disease.   He is here today for follow up. The patient denies any chest pain, dyspnea, palpitations, lower extremity edema, orthopnea, PND, dizziness, near syncope or syncope.   Primary Care Physician: [name], MD   Past Medical History:  Diagnosis Date  . Arthritis   . Diverticulosis of colon (without mention of hemorrhage)   . GERD (gastroesophageal reflux disease)   . Hiatal hernia   . HLD (hyperlipidemia)   . Internal hemorrhoids without mention of complication   . Irritable bowel syndrome   . Other specified gastritis without mention of hemorrhage     Past Surgical History:  Procedure Laterality Date  . CARPAL TUNNEL RELEASE     left  . CATARACT EXTRACTION  06/2011   bilateral  . [place]   x 2  . LUMBAR LAMINECTOMY/DECOMPRESSION MICRODISCECTOMY  Left 07/11/2013   Procedure: LUMBAR ONE TO TWO, LUMBAR TWO TO THREE, LUMBAR THREE TO FOUR LUMBAR LAMINECTOMY/DECOMPRESSION MICRODISCECTOMY 3 LEVELS;  Surgeon: Charlie Pitter, MD;  Location: [facility];  Service: Neurosurgery;  Laterality: Left;    Current Outpatient Prescriptions  Medication Sig Dispense Refill  . acetaminophen-codeine (TYLENOL #3) 300-30 MG per tablet Take 1 tablet by mouth every 8 (eight) hours as needed for moderate pain.     . carisoprodol (SOMA) 350 MG tablet Take 175 mg by mouth 3 (three) times daily as needed for muscle spasms.     . Cholecalciferol (VITAMIN D-3) 1000 UNITS CAPS Take 1,000 Units by mouth daily.    Marland Kitchen CO-ENZYME Q-10 PO Take 1 tablet by mouth daily.    Marland Kitchen dicyclomine (BENTYL) 10 MG capsule Take one by mouth every 6 hours as needed for abdominal cramping 30 capsule 1  . diphenoxylate-atropine (LOMOTIL) 2.5-0.025 MG tablet Take 1 tablet by mouth daily as needed.    Marland Kitchen esomeprazole (NEXIUM) 40 MG capsule Take 40 mg by mouth every evening.     . fexofenadine (ALLEGRA) 30 MG tablet Take 30 mg by mouth daily as needed (allergies).     . fluticasone (FLONASE) 50 MCG/ACT nasal spray Place 2 sprays into the nose daily as needed for allergies.     . Glucosamine-Chondroitin (GLUCOSAMINE CHONDR COMPLEX PO) Take 1 tablet by mouth daily.    Marland Kitchen HYDROcodone-acetaminophen (NORCO/VICODIN) 5-325 MG per tablet Take 1-2 tablets  by mouth every 6 (six) hours as needed for moderate pain.    . meloxicam (MOBIC) 7.5 MG tablet Take 7.5 mg by mouth daily.    . methocarbamol (ROBAXIN) 500 MG tablet Take 1 tablet (500 mg total) by mouth every 8 (eight) hours as needed for muscle spasms. 40 tablet 0  . rOPINIRole (REQUIP) 0.5 MG tablet Take 0.5 mg by mouth at bedtime.    . simvastatin (ZOCOR) 20 MG tablet Take 20 mg by mouth every evening.     No current facility-administered medications for this visit.     No Known Allergies  Social History   Social History  . Marital status: Married     Spouse name: N/A  . Number of children: 2  . Years of education: N/A   Occupational History  . PHYCISIAN Retired    Stage manager    Social History Main Topics  . Smoking status: Never Smoker  . Smokeless tobacco: Never Used     Comment: social  . Alcohol use Yes     Comment: 6 per week  . Drug use: No  . Sexual activity: Not on file   Other Topics Concern  . Not on file   Social History Narrative   Retired.     Family History  Problem Relation Age of Onset  . Heart failure Father 52  . Colon cancer Neg Hx   . Pancreatic cancer Neg Hx   . Rectal cancer Neg Hx   . Stomach cancer Neg Hx     Review of Systems:  As stated in the HPI and otherwise negative.   BP 112/70   Pulse 87   Ht _0  (1.778 m)   Wt 186 lb (84.4 kg)   SpO2 97%   BMI 26.69 kg/m   Physical Examination:  General: Well developed, well nourished, NAD  HEENT: OP clear, mucus membranes moist  SKIN: warm, dry. No rashes. Neuro: No focal deficits  Musculoskeletal: Muscle strength 5/5 all ext  Psychiatric: Mood and affect normal  Neck: No JVD, no carotid bruits, no thyromegaly, no lymphadenopathy.  Lungs:Clear bilaterally, no wheezes, rhonci, crackles Cardiovascular: Regular rate and rhythm. No murmurs, gallops or rubs. Abdomen:Soft. Bowel sounds present. Non-tender.  Extremities: No lower extremity edema. Pulses are 2 + in the bilateral DP/PT.  Echo 03/02/17: - Left ventricle: The cavity size was normal. Wall thickness was   increased in a pattern of mild LVH. Systolic function was normal.   The estimated ejection fraction was in the range of 60% to 65%.   Wall motion was normal; there were no regional wall motion   abnormalities. Doppler parameters are consistent with abnormal   left ventricular relaxation (grade 1 diastolic dysfunction). - Aortic valve: There was no stenosis. - Aorta: Mildly dilated aortic root. Aortic root dimension: 39 mm   (ED). - Mitral valve: Mildly calcified  annulus. There was trivial   regurgitation. - Right ventricle: The cavity size was normal. Systolic function   was normal. - Tricuspid valve: Peak RV-RA gradient (S): 17 mm Hg. - Pulmonary arteries: PA peak pressure: 20 mm Hg (S). - Inferior vena cava: The vessel was normal in size. The   respirophasic diameter changes were in the normal range (= 50%),   consistent with normal central venous pressure.  Impressions:  - Normal LV size with mild LV hypertrophy. EF 60-65%. Normal RV   size and systolic function. No significant valvular   abnormalities.  Nuclear stress test 03/02/17:  Nuclear stress EF:  62%. No wall motion abnormalities  There was no ST segment deviation noted during stress.  The study is normal.  This is a low risk study. No ischemia identified.   EKG:  EKG is not ordered today. The ekg ordered today demonstrates   Recent Labs: No results found for requested labs within last 8760 hours.   Lipid Panel No results found for: CHOL, TRIG, HDL, CHOLHDL, VLDL, LDLCALC, LDLDIRECT   Wt Readings from Last 3 Encounters:  03/10/17 186 lb (84.4 kg)  03/04/17 186 lb (84.4 kg)  02/24/17 186 lb 1.9 oz (84.4 kg)     Other studies Reviewed: Additional studies/ records that were reviewed today include: CT scan report, primary care office notes Review of the above records demonstrates:   Assessment and Plan:   1. CAD without angina: He has evidence of CAD on his non-cardiac CT scan. He has no symptoms worrisome for angina. Nuclear stress test August 2018 with no ischemia. Echo with normal LV systolic function and no valve disease. He does have mild LVH. Given the current findings, I will plan on reassurance for now. He will continue to lead a healthy lifestyle with exercise and healthy diet.    2. Dyspnea: He has been found to have pulmonary fibrosis and is being followed in the Pulmonary clinic for this.   Current medicines are reviewed at length with the patient today.   The patient does not have concerns regarding medicines.  The following changes have been made:  no change  Labs/ tests ordered today include: nuclear stress test and echo  No orders of the defined types were placed in this encounter.  Disposition:   FU with me in one year  Signed, Lauree Chandler, MD 03/10/2017 3:44 PM    [facility] [place], [place], [place]  [address] Phone: [phone]; Fax: [phone]

## 2017-03-10 NOTE — Patient Instructions (Signed)
Medication Instructions:  Your physician recommends that you continue on your current medications as directed. Please refer to the Current Medication list given to you today.   Labwork: none  Testing/Procedures: none  Follow-Up: Your physician recommends that you schedule a follow-up appointment in: 12 months. Please call our office in about 9 months to schedule this appointment.     Any Other Special Instructions Will Be Listed Below (If Applicable).     If you need a refill on your cardiac medications before your next appointment, please call your pharmacy.

## 2017-03-10 NOTE — Telephone Encounter (Signed)
This was given to MR on 03/04/2017. Will sign off.

## 2017-03-15 ENCOUNTER — Ambulatory Visit: Payer: PPO | Admitting: Cardiology

## 2017-03-23 ENCOUNTER — Encounter: Payer: Self-pay | Admitting: Internal Medicine

## 2017-03-23 ENCOUNTER — Telehealth: Payer: Self-pay | Admitting: Internal Medicine

## 2017-03-23 ENCOUNTER — Ambulatory Visit (INDEPENDENT_AMBULATORY_CARE_PROVIDER_SITE_OTHER): Payer: PPO | Admitting: Internal Medicine

## 2017-03-23 VITALS — BP 128/70 | HR 82 | Ht 70.0 in | Wt 185.6 lb

## 2017-03-23 DIAGNOSIS — J84112 Idiopathic pulmonary fibrosis: Secondary | ICD-10-CM | POA: Diagnosis not present

## 2017-03-23 NOTE — Progress Notes (Addendum)
Subjective:     Patient ID: Kenneth [name], male   DOB: October 31, 1940, 76 y.o.   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 He tells me that this summer 2018*noticing insidious onset of shortness of breath particularly with walking stairs in the house or [place]. This was not that in the previous years. The summer 2018 he was in [place] but did not notice much shortness of breath. Otherwise he is able to do his activities of daily living and played golf with a car. He is more bothered by his back pain and sciatica as a result of previous back surgery. Shortness of breath is extremely mild. He says he became more aware of it after the radiologic investigations documented below. He had a chest x-ray that suggested interstitial findings and therefore he underwent a high-resolution CT scan of the chest that is described below. I personally visualized this high resolution CT chest and to me it shows bilateral bibasal subpleural reticulation that also extends to the upper lobes without any zonal predominance. There is no obvious honeycombing but there is traction bronchiectasis. There no mediastinal adenopathy. Therefore he is been referred here. In terms of exposure history other than radiation exposure he has not been on any pulmonary toxic drugs or any mold exposure or asbestos exposure. Does have GERD and is on PPI and is well ocntrolled   Does have spring and perennial allergies - sneezes easy. Allergy test with [facility] negative some years ago  [employer] chest physicians interstitial lung disease questionnaire: He says that he does not cough. He is only trouble with dyspnea with strenuous exercise and it started 4 months ago. Past medical history significant for acid reflux. He  has never smoked any tobacco or street drugs. Does not have any family history of lung disease. At home he does not have any humidifier sound birds heart the water damage or mold. 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 He has standard radiation exposure while at work. The standard radiation for a radiologist. He does not take any pulmonary toxic drugs. House is 75 years old and he worries about mold. He sneezes if air is forced   Pulmonary function test shows isolated reduction in diffusion capacity to 62%. Walking desaturation test underneath her feet 3 laps on room air: Resting pulse ox 96%. Final pulse ox 95%. Heart rate resting was 92/m and went up to 109 minute. Results for WIN, GUAJARDO (MRN [id number]) as of 03/23/2017 10:04  Ref. Range 03/04/2017 09:13  FVC-Pre Latest Units: L 3.59  FVC-%Pred-Pre Latest Units: % 86  Results for PRESTYN, STANCO (MRN [id number]) as of 03/23/2017 10:04  Ref. Range 03/04/2017 09:13  DLCO cor Latest Units: ml/min/mmHg 20.06  DLCO cor % pred Latest Units: % 63    Lungs/Pleura: There is subpleural reticulation with scattered ground-glass and traction bronchiolectasis, without a definite zonal predominance. No definitive honeycombing. Findings appear mildly progressive when radiographs dating back to 12/28/2008 are reviewed. Probable 5 mm subpleural lymph node along the right major fissure. No pleural fluid. Airway is unremarkable.      IMPRESSION: 1. Pulmonary parenchymal findings of interstitial lung disease which may be due to fibrotic nonspecific interstitial pneumonitis  or, given slight progression over time, usual interstitial pneumonitis. 2. Aortic atherosclerosis (ICD10-170.0). Coronary artery calcification. 3. Aortic aneurysm NOS (ICD10-I71.9). 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 Recommend annual imaging followup by CTA or MRA. This recommendation follows  2010 ACCF/AHA/AATS/ACR/ASA/SCA/SCAI/SIR/STS/SVM Guidelines for the Diagnosis and Management of Patients with Thoracic Aortic Disease. Circulation. 2010; 121: O350-K938. 4. Prominence of the right and left main pulmonary arteries can be seen with pulmonary arterial hypertension.   Electronically Signed   By: [name]   On: 02/17/2017 13:41     OV 03/23/2017  Chief Complaint  Patient presents with  . Follow-up    SOB on exertion. Other than that he has been doing about the same. Denies any cough or CP.   Here to discuss results. [name]. 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 In inteirm no new symptoms.  Autoimmune profile negative. They raise possibility of 2nd opinion.  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 Progression is based on CXR comparison since 2010; there is no CT and is only suspicion of progression  Results for KENYETTA, FIFE (MRN [id number]) as of 03/23/2017 10:04  Ref. Range 12/08/2011 16:44 03/04/2017 13:47  Anit Nuclear Antibody(ANA) Latest Ref Range: NEGATIVE   NEG  Angiotensin-Converting Enzyme Latest Ref Range: 9 - 67 U/L  31  Cyclic Citrullin Peptide Ab Latest Units: Units  <16  ds DNA Ab Latest Units: IU/mL  <1  ENA RNP Ab Latest Ref Range: 0.0 - 0.9 AI  0.2  Endomysial Screen Latest Ref Range: NEGATIVE  NEGATIVE   Deamidated Gliadin Abs, IgG Latest Ref Range: <20 U/mL 4.1   Gliadin IgA Latest Ref Range: <20 U/mL 1.8   RA Latex Turbid. Latest Ref Range: <14 IU/mL  <14  Tissue Transglut Ab Latest Ref Range: <20 U/mL 3.9   Tissue Transglutaminase Ab, IgA Latest Ref Range: <20 U/mL 2.2   IgE (Immunoglobulin E), Serum Latest Ref Range: <115 kU/L  31  IgA Latest Ref Range: 68 - 379 mg/dL 132   SSA (Ro) (ENA) Antibody, IgG Latest Ref Range: <1.0 NEG AI   <1.0 NEG  SSB (La) (ENA) Antibody, IgG Latest Ref Range: <1.0 NEG AI   <1.0 NEG    has a past  medical history of Arthritis; Diverticulosis of colon (without mention of hemorrhage); GERD (gastroesophageal reflux disease); Hiatal hernia; HLD (hyperlipidemia); Internal hemorrhoids without mention of complication; Irritable bowel syndrome; and Other specified gastritis without mention of hemorrhage.   reports that he has never smoked. He has never used smokeless tobacco.  Past Surgical History:  Procedure Laterality Date  . CARPAL TUNNEL RELEASE     left  . CATARACT EXTRACTION  06/2011   bilateral  . [place]   x 2  . LUMBAR LAMINECTOMY/DECOMPRESSION MICRODISCECTOMY Left 07/11/2013   Procedure: LUMBAR ONE TO TWO, LUMBAR TWO TO THREE, LUMBAR THREE TO FOUR LUMBAR LAMINECTOMY/DECOMPRESSION MICRODISCECTOMY 3 LEVELS;  Surgeon: Charlie Pitter, MD;  Location: [facility];  Service: Neurosurgery;  Laterality: Left;    No Known Allergies  Immunization History  Administered Date(s) Administered  . Influenza,inj,Quad PF,6+ Mos 04/05/2016  . Pneumococcal-Unspecified 04/05/2016  . Zoster Recombinat (Shingrix) 01/03/2017    Family History  Problem Relation Age of Onset  . Heart failure Father 50  . Colon cancer Neg Hx   . Pancreatic cancer Neg Hx   . Rectal  cancer Neg Hx   . Stomach cancer Neg Hx      Current Outpatient Prescriptions:  .  acetaminophen-codeine (TYLENOL #3) 300-30 MG per tablet, Take 1 tablet by mouth every 8 (eight) hours as needed for moderate pain. , Disp: , Rfl:  .  carisoprodol (SOMA) 350 MG tablet, Take 175 mg by mouth 3 (three) times daily as needed for muscle spasms. , Disp: , Rfl:  .  Cholecalciferol (VITAMIN D-3) 1000 UNITS CAPS, Take 1,000 Units by mouth daily., Disp: , Rfl:  .  CO-ENZYME Q-10 PO, Take 1 tablet by mouth daily., Disp: , Rfl:  .  dicyclomine (BENTYL) 10 MG capsule, Take one by mouth every 6 hours as needed for abdominal cramping, Disp: 30 capsule, Rfl: 1 .  diphenoxylate-atropine (LOMOTIL) 2.5-0.025 MG tablet, Take 1 tablet  by mouth daily as needed., Disp: , Rfl:  .  esomeprazole (NEXIUM) 40 MG capsule, Take 40 mg by mouth every evening. , Disp: , Rfl:  .  fexofenadine (ALLEGRA) 30 MG tablet, Take 30 mg by mouth daily as needed (allergies). , Disp: , Rfl:  .  fluticasone (FLONASE) 50 MCG/ACT nasal spray, Place 2 sprays into the nose daily as needed for allergies. , Disp: , Rfl:  .  Glucosamine-Chondroitin (GLUCOSAMINE CHONDR COMPLEX PO), Take 1 tablet by mouth daily., Disp: , Rfl:  .  HYDROcodone-acetaminophen (NORCO/VICODIN) 5-325 MG per tablet, Take 1-2 tablets by mouth every 6 (six) hours as needed for moderate pain., Disp: , Rfl:  .  meloxicam (MOBIC) 7.5 MG tablet, Take 7.5 mg by mouth daily., Disp: , Rfl:  .  methocarbamol (ROBAXIN) 500 MG tablet, Take 1 tablet (500 mg total) by mouth every 8 (eight) hours as needed for muscle spasms., Disp: 40 tablet, Rfl: 0 .  rOPINIRole (REQUIP) 0.5 MG tablet, Take 0.5 mg by mouth at bedtime., Disp: , Rfl:  .  simvastatin (ZOCOR) 20 MG tablet, Take 20 mg by mouth every evening., Disp: , Rfl:     Review of Systems     Objective:   Physical Exam  Vitals:   03/23/17 1016  BP: 128/70  Pulse: 82  SpO2: 96%  Weight: 185 lb 9.6 oz (84.2 kg)  Height: _0  (1.778 m)   Estimated body mass index is 26.63 kg/m as calculated from the following:   Height as of this encounter: _1  (1.778 m).   Weight as of this encounter: 185 lb 9.6 oz (84.2 kg).  Discussion only visit    Assessment:       ICD-10-CM   1. 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 DO not see a rationale for biopsy given high pre-test prob.   Current severity is mild    Plan:     Discussed the following with him and wife. Gave PFF booklet about disease  Re IPF - Course    - progressive disease in almost all patients (> 90%); typically few to several year progression   - super unlukcy 10% - progress to death in a year   - super lucky < 10% - stability > 5 years or even rarely 10 years   -unpredictable in each individual - Rx:  anti-fibrotics + since 2014 but they can only slow disease down; preventative.  Not Rx symptoms  - they have side effects including intolerance is < 1/3rd patients  - most 55-65% patients tolerate them well  - discussed esbriet and ofev - based on below - he and wife chose esbriet - Other pillars of management  - Symptoms - cough and dyspnea RX: he currently has no cough but for dyspnea he will keep himself active  - O2 - definitely helps symptoms - but at this point he does not need  - Rehab - definitely helps symptoms and conditioning - currenlty ECOG 0 and fit. Will discuss in detail at followup  - Transplant - age < 81, good social support, BMI < 26 and $15,000 in cash - currenty due to age and early disease not a candidate but can address in future  - Pulmonary Trials: highly encourage participation through [facility] - brieftly touched based. Will discuss more in future  - Patient Support Group    - [email or web address] - will discuss more at future  - 2nd opinion   - suggested [facility] as options to explore if they felt it was needed; will discuss more at followup but he might call earlier about this   - [email or web address]   Anti-fibrotic Drugs Both drugs OFEV and Esbriet only slow down progression, 1 out of 6 patients  - this means extension in quality of life but no difference in symptoms  - no study directly compares the 2 drugs but efficacy roughly equal at 1 year time point   - OFEV  - - time to first exacerbation possibly reduced in one trial but not in another - twice daily, no titration,  potentially more convenient dosing  - no need for sunscreen  - high chance of mild diarrhea but low chance of significant diarrhea needing to stop medication.   - Rx diarrhea with lomotil - slight increase in heart attack risk and theoretical increase in bleeding risk,   - need monthly blood work for 3 months and then every 6 months - monitor liver function   - ESBRIET  - 3 pill three times daily, slow titration.  - Need to wean sunscreen  - Some chance of nausea and anorexia with small chance for diarrhea  - no known heart attack risk - no known bleeding risk,   - need monthly blood work for 6 months - monitor liver function - possible mortality benefit in pooled analysis  - larger world wide experience    > 50% of this > 40 min visit spent in face to face counseling or/and coordination of care     Dr. Brand Males, M.D., [facility].C.P Pulmonary and Critical Care Medicine Staff Physician, [place] Director - Interstitial Lung Disease  Pulmonary [place] at [facility], [place], [address]  Pager: [phone], If no answer or between  15:00h - 7:00h: call [phone] Telephone: [phone]

## 2017-03-23 NOTE — Patient Instructions (Addendum)
ICD-10-CM   1. 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

## 2017-03-23 NOTE — Telephone Encounter (Signed)
Will route to MR.  

## 2017-03-24 ENCOUNTER — Encounter: Payer: Self-pay | Admitting: Internal Medicine

## 2017-03-24 ENCOUNTER — Other Ambulatory Visit: Payer: Self-pay | Admitting: *Deleted

## 2017-03-24 DIAGNOSIS — J84112 Idiopathic pulmonary fibrosis: Secondary | ICD-10-CM

## 2017-03-24 NOTE — Telephone Encounter (Signed)
Called and spoke with pt letting him know that I placed the order for the referral to [facility] and that our [facility] will take care of the referral. Pt expressed understanding. Nothing further needed at this time.

## 2017-03-24 NOTE — Telephone Encounter (Signed)
Already spoke to patient Dr Westley Chandler and sent him email. He and I will be in touch via email. We decided on referral to [facility]. Will close encounter  Dr. Brand Males, M.D., [facility].C.P Pulmonary and Critical Care Medicine Staff Physician, [place] Director - Interstitial Lung Disease  Pulmonary [place] at [facility], [place], [address]  Pager: [phone], If no answer or between  15:00h - 7:00h: call [phone] Telephone: [phone]

## 2017-03-24 NOTE — Telephone Encounter (Signed)
D/w patient. Wants referral to [facility]. I have initiated contact with [employer] via email. Patient might call for copies of notes, PFT and CD Rom of CT - if so please facilitate  Changing route to [facility] CMA   Dr. Brand Males, M.D., [facility].C.P Pulmonary and Critical Care Medicine Staff Physician, [place] Director - Interstitial Lung Disease  Pulmonary [place] at [facility], [place], [address]  Pager: [phone], If no answer or between  15:00h - 7:00h: call [phone] Telephone: [phone]

## 2017-03-24 NOTE — Telephone Encounter (Signed)
MR please advise. Thanks!

## 2017-03-31 ENCOUNTER — Telehealth: Payer: Self-pay | Admitting: Internal Medicine

## 2017-03-31 NOTE — Telephone Encounter (Signed)
Emily/Elise  Where are with Dr Westley Chandler esbriet and referral to [facility] ?  Thanks  Dr. Brand Males, M.D., [facility].C.P Pulmonary and Critical Care Medicine Staff Physician [place] Pulmonary and Critical Care Pager: [phone], If no answer or between  15:00h - 7:00h: call [phone]  03/31/2017 8:06 PM

## 2017-04-01 NOTE — Telephone Encounter (Signed)
He emailed me late last night - appt at [facility] is 04/06/17. We are alll set there. Please ensure that Kenneth [name] got all records to them and they have confirmed receipt  Please work on esbriet  Dr. Brand Males, M.D., [facility].C.P Pulmonary and Critical Care Medicine Staff Physician [facility] Pulmonary and Critical Care Pager: [phone], If no answer or between  15:00h - 7:00h: call [phone]  04/01/2017 9:13 AM

## 2017-04-01 NOTE — Telephone Encounter (Signed)
Checked with Kenneth [name] regarding if she had heard back about the Esbriet and also checked with Kenneth [name] from [facility] to see if any news regarding the referral we put in to [facility].  We are going to follow up on both of these and will update you once we know more.

## 2017-04-01 NOTE — Telephone Encounter (Signed)
Records were faxed to [facility] by Chantel. Daneil Dan was going to follow-up on Esbriet and said if she needed to refax the paperwork, she would.

## 2017-04-02 NOTE — Telephone Encounter (Signed)
Received fax from [employer] stating that the request for Esbriet is currently in process.  Called pt to let him know this but pt did not answer. Left a message for pt to call us back.

## 2017-04-02 NOTE — Telephone Encounter (Signed)
Called and spoke to tech with [employer] at [phone]. PA initiated for pt's Esbriet. This has been approved until the end of the pt's plan - 07/05/2017. Approval information will be faxed from [place] to [place] at [phone]. The PA# - [id number].   Will inform pt when he calls back that the Esbriet has been approved and he should be contacted to schedule a shipment date.

## 2017-04-02 NOTE — Telephone Encounter (Signed)
Pt returned call and I voiced to him that the Esbriet has been approved and he should be receiving a call soon to schedule a shipment date for the med.  Pt expressed understanding.  Nothing further needed at this time.

## 2017-04-06 DIAGNOSIS — J849 Interstitial pulmonary disease, unspecified: Secondary | ICD-10-CM | POA: Diagnosis not present

## 2017-04-06 DIAGNOSIS — J8489 Other specified interstitial pulmonary diseases: Secondary | ICD-10-CM | POA: Diagnosis not present

## 2017-04-06 DIAGNOSIS — J84112 Idiopathic pulmonary fibrosis: Secondary | ICD-10-CM | POA: Diagnosis not present

## 2017-04-09 DIAGNOSIS — M48061 Spinal stenosis, lumbar region without neurogenic claudication: Secondary | ICD-10-CM | POA: Diagnosis not present

## 2017-04-09 DIAGNOSIS — I2510 Atherosclerotic heart disease of native coronary artery without angina pectoris: Secondary | ICD-10-CM | POA: Diagnosis not present

## 2017-04-09 DIAGNOSIS — K219 Gastro-esophageal reflux disease without esophagitis: Secondary | ICD-10-CM | POA: Diagnosis not present

## 2017-04-09 DIAGNOSIS — G2581 Restless legs syndrome: Secondary | ICD-10-CM | POA: Diagnosis not present

## 2017-04-09 DIAGNOSIS — Z6827 Body mass index (BMI) 27.0-27.9, adult: Secondary | ICD-10-CM | POA: Diagnosis not present

## 2017-04-09 DIAGNOSIS — J84112 Idiopathic pulmonary fibrosis: Secondary | ICD-10-CM | POA: Diagnosis not present

## 2017-04-15 ENCOUNTER — Telehealth: Payer: Self-pay | Admitting: Internal Medicine

## 2017-04-15 NOTE — Telephone Encounter (Signed)
Kenneth [name] wll start esbriet this weekend. So, please give fu appt with me 3 weeks from 04/17/17 . Cancel appt with SG on 05/10/17  Dr. Brand Males, M.D., [facility].C.P Pulmonary and Critical Care Medicine Staff Physician [place] Pulmonary and Critical Care Pager: [phone], If no answer or between  15:00h - 7:00h: call [phone]  04/15/2017 10:49 AM

## 2017-04-16 NOTE — Telephone Encounter (Signed)
Tried to call pt to change his appt from SG to MR. Planning on keeping appt date the same of 05/10/17 with MR but change the time to a different one, but when trying to call pt on mobile number, when call seemed like it was connected, call got dropped.  Tried to call pt's home number but the number was not working and could possibly not be working due to [employer].  Will try to call pt later to get appt changed to MR.

## 2017-04-19 NOTE — Telephone Encounter (Signed)
Spoke with patient, patient is now scheduled on MR schedule for Thursday November 1st at 11:15

## 2017-04-21 NOTE — Progress Notes (Signed)
IPF PRO Registry Purpose: To collect data and biological samples that will support future research studies.  Registry will describe current approaches to diagnosis and treatment of IPF, analyze participant characteristics to describe the natural history of the disease, assess quality of life, describe participants interactions with the health care system, describe IPF treatment practices across multiple institutions, and utilize biological samples linked to well characterized IPF participants to identify disease biomarkers.   Clinical Research Coordinator / Research RN note : This visit for [name] with DOB: 02/19/1941 on 04/21/2017 for the above protocol is for screening and enrollment and is for purpose of research . The consent and Protocol for this encounter is IRB approved. Subject has expressed continued interest and consent in continuing as a study subject. Subject confirmed that there were no changes in contact information (e.g. address, telephone, email). Subject thanked for participation in research and contribution to science.   In this visit 04/21/2017 the subject was evaluated by investigator named Dr. Ann Lions  . This research coordinator has verified that the investigator is uptodate with his/her training logs.   Because this visit is a key visit of screening and enrollment, this visit is under direct supervision of the PI Dr. Ann Lions . This PI is  available for this visit   The CRC will confirmed that the note will be routed to PI.  Signed by  August Luz Clinical Research Coordinator / Nurse Horatio Pel, [place] 11:33 AM 04/21/2017

## 2017-04-26 ENCOUNTER — Other Ambulatory Visit: Payer: Self-pay | Admitting: Orthopedic Surgery

## 2017-04-26 DIAGNOSIS — M5416 Radiculopathy, lumbar region: Secondary | ICD-10-CM

## 2017-04-28 ENCOUNTER — Ambulatory Visit
Admission: RE | Admit: 2017-04-28 | Discharge: 2017-04-28 | Disposition: A | Discharge status: Home / Self Care | Payer: PPO | Source: Ambulatory Visit | Attending: Orthopedic Surgery | Admitting: Orthopedic Surgery

## 2017-04-28 DIAGNOSIS — M5416 Radiculopathy, lumbar region: Secondary | ICD-10-CM

## 2017-04-28 DIAGNOSIS — M47817 Spondylosis without myelopathy or radiculopathy, lumbosacral region: Secondary | ICD-10-CM | POA: Diagnosis not present

## 2017-04-28 MED ORDER — IOPAMIDOL (ISOVUE-M 200) INJECTION 41%
1.0000 mL | Freq: Once | INTRAMUSCULAR | Status: AC
  Administered 2017-04-28: 1 mL via EPIDURAL

## 2017-04-28 MED ORDER — METHYLPREDNISOLONE ACETATE 40 MG/ML INJ SUSP (RADIOLOG
120.0000 mg | Freq: Once | INTRAMUSCULAR | Status: AC
  Administered 2017-04-28: 120 mg via EPIDURAL

## 2017-05-06 ENCOUNTER — Other Ambulatory Visit (INDEPENDENT_AMBULATORY_CARE_PROVIDER_SITE_OTHER): Payer: PPO

## 2017-05-06 ENCOUNTER — Ambulatory Visit: Payer: PPO | Admitting: Internal Medicine

## 2017-05-06 ENCOUNTER — Encounter: Payer: Self-pay | Admitting: Internal Medicine

## 2017-05-06 ENCOUNTER — Ambulatory Visit (INDEPENDENT_AMBULATORY_CARE_PROVIDER_SITE_OTHER): Payer: PPO | Admitting: Internal Medicine

## 2017-05-06 VITALS — BP 126/58 | HR 91 | Ht 70.0 in | Wt 183.2 lb

## 2017-05-06 DIAGNOSIS — J84112 Idiopathic pulmonary fibrosis: Secondary | ICD-10-CM

## 2017-05-06 DIAGNOSIS — Z5181 Encounter for therapeutic drug level monitoring: Secondary | ICD-10-CM

## 2017-05-06 LAB — HEPATIC FUNCTION PANEL
ALT: 20 U/L (ref 0–53)
AST: 20 U/L (ref 0–37)
Albumin: 4.6 g/dL (ref 3.5–5.2)
Alkaline Phosphatase: 53 U/L (ref 39–117)
Bilirubin, Direct: 0.1 mg/dL (ref 0.0–0.3)
Total Bilirubin: 0.5 mg/dL (ref 0.2–1.2)
Total Protein: 7.4 g/dL (ref 6.0–8.3)

## 2017-05-06 NOTE — Patient Instructions (Addendum)
ICD-10-CM   1. 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[place]) J84.112   2. Encounter for therapeutic drug monitoring Z51.81     Clinically stable Glad you are tolerating esbriet x 3 weeks other than mild nausea Glad you are applying sunscreen  Plan Check LFT 05/06/2017 and again in 1 months and in 2 months Continue esbriet  3 pills three times daily   - take with food, space atleast 4h between tablets In 2 months do Pre-bd spiro and dlco only. No lung volume or bd response.  Refer pulmonary rehab  Followup - call if any side effects - return in 2 months but after above

## 2017-05-06 NOTE — Progress Notes (Signed)
Left a detailed msg with results

## 2017-05-06 NOTE — Progress Notes (Signed)
Subjective:     Patient ID: Kenneth [name], male   DOB: 26-Jun-1941, 76 y.o.   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 He tells me that this summer 2018*noticing insidious onset of shortness of breath particularly with walking stairs in the house or [place]. This was not that in the previous years. The summer 2018 he was in [place] but did not notice much shortness of breath. Otherwise he is able to do his activities of daily living and played golf with a car. He is more bothered by his back pain and sciatica as a result of previous back surgery. Shortness of breath is extremely mild. He says he became more aware of it after the radiologic investigations documented below. He had a chest x-ray that suggested interstitial findings and therefore he underwent a high-resolution CT scan of the chest that is described below. I personally visualized this high resolution CT chest and to me it shows bilateral bibasal subpleural reticulation that also extends to the upper lobes without any zonal predominance. There is no obvious honeycombing but there is traction bronchiectasis. There no mediastinal adenopathy. Therefore he is been referred here. In terms of exposure history other than radiation exposure he has not been on any pulmonary toxic drugs or any mold exposure or asbestos exposure. Does have GERD and is on PPI and is well ocntrolled   Does have spring and perennial allergies - sneezes easy. Allergy test with [facility] negative some years ago  [employer] chest physicians interstitial lung disease questionnaire: He says that he does not cough. He is only trouble with dyspnea with strenuous exercise and it started 4 months ago. Past medical history significant for acid reflux. He  has never smoked any tobacco or street drugs. Does not have any family history of lung disease. At home he does not have any humidifier sound birds heart the water damage or mold. 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 He has standard radiation exposure while at work. The standard radiation for a radiologist. He does not take any pulmonary toxic drugs. House is 75 years old and he worries about mold. He sneezes if air is forced   Pulmonary function test shows isolated reduction in diffusion capacity to 62%. Walking desaturation test underneath her feet 3 laps on room air: Resting pulse ox 96%. Final pulse ox 95%. Heart rate resting was 92/m and went up to 109 minute. Results for Kenneth [name], Kenneth [name] (MRN [id number]) as of 03/23/2017 10:04  Ref. Range 03/04/2017 09:13  FVC-Pre Latest Units: L 3.59  FVC-%Pred-Pre Latest Units: % 86  Results for Kenneth [name], Kenneth [name] (MRN [id number]) as of 03/23/2017 10:04  Ref. Range 03/04/2017 09:13  DLCO cor Latest Units: ml/min/mmHg 20.06  DLCO cor % pred Latest Units: % 63    Lungs/Pleura: There is subpleural reticulation with scattered ground-glass and traction bronchiolectasis, without a definite zonal predominance. No definitive honeycombing. Findings appear mildly progressive when radiographs dating back to 12/28/2008 are reviewed. Probable 5 mm subpleural lymph node along the right major fissure. No pleural fluid. Airway is unremarkable.      IMPRESSION: 1. Pulmonary parenchymal findings of interstitial lung disease which may be due to fibrotic nonspecific interstitial pneumonitis or,  given slight progression over time, usual interstitial pneumonitis. 2. Aortic atherosclerosis (ICD10-170.0). Coronary artery calcification. 3. Aortic aneurysm NOS (ICD10-I71.9). 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 Recommend annual imaging followup by CTA or MRA. This recommendation follows  2010 ACCF/AHA/AATS/ACR/ASA/SCA/SCAI/SIR/STS/SVM Guidelines for the Diagnosis and Management of Patients with Thoracic Aortic Disease. Circulation. 2010; 121: R740-C144. 4. Prominence of the right and left main pulmonary arteries can be seen with pulmonary arterial hypertension.   Electronically Signed   By: [name]   On: 02/17/2017 13:41     OV 03/23/2017  Chief Complaint  Patient presents with  . Follow-up    SOB on exertion. Other than that he has been doing about the same. Denies any cough or CP.   Here to discuss results. [name]. 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 In inteirm no new symptoms.  Autoimmune profile negative. They raise possibility of 2nd opinion.  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 Progression is based on CXR comparison since 2010; there is no CT and is only suspicion of progression  Results for Kenneth [name], Kenneth [name] (MRN [id number]) as of 03/23/2017 10:04  Ref. Range 12/08/2011 16:44 03/04/2017 13:47  Anit Nuclear Antibody(ANA) Latest Ref Range: NEGATIVE   NEG  Angiotensin-Converting Enzyme Latest Ref Range: 9 - 67 U/L  31  Cyclic Citrullin Peptide Ab Latest Units: Units  <16  ds DNA Ab Latest Units: IU/mL  <1  ENA RNP Ab Latest Ref Range: 0.0 - 0.9 AI  0.2  Endomysial Screen Latest Ref Range: NEGATIVE  NEGATIVE   Deamidated Gliadin Abs, IgG Latest Ref Range: <20 U/mL 4.1   Gliadin IgA Latest Ref Range: <20 U/mL 1.8   RA Latex Turbid. Latest Ref Range: <14 IU/mL  <14  Tissue Transglut Ab Latest Ref Range: <20 U/mL 3.9   Tissue Transglutaminase Ab, IgA Latest Ref Range: <20 U/mL 2.2   IgE (Immunoglobulin E), Serum Latest Ref Range: <115 kU/L  31  IgA Latest Ref Range: 68 - 379 mg/dL 132   SSA (Ro) (ENA) Antibody, IgG Latest Ref Range: <1.0 NEG AI   <1.0 NEG  SSB (La) (ENA) Antibody, IgG Latest Ref Range: <1.0 NEG AI   <1.0 NEG    OV  05/06/2017  Chief Complaint  Patient presents with  . Follow-up    Follow up today after beginning [place]. Pt has no c/o SOB, cough, or CP. Has had some mild nausea from the [place] but other than that, he has been doing good on it.   ollow-up idiopathic pulmonary fibrosis mild severity  He is here to follow-up He is on the third week of his bed at 3 pills 3 times a day max dose. So far is tolerating it fine other than mild nausea. He does take after meals. He plans to meet with [facility] coordinator for medication support. He did visit [place] this past weekend and did have vomiting especially after consuming few etoh drinks at a [employer]. He does apply sunscreen [his daughter is a Paediatric nurse in [place]. He still complains about the fact that and is 75 year old home when the air conditioner comes on and they air blows out of the duct system he does sneeze 10 times and his feet does feel cold. She plans to put hepa filters that are high and and also get the duct cleaning. He says he did have allergy evaluation and found  few years ago and it was negative. Recently he has increased his rpinrole and alsogot an injection for his back and he does feel better    has a past medical history of Arthritis; Diverticulosis of colon (without mention of hemorrhage); GERD (gastroesophageal reflux disease); Hiatal hernia; HLD (hyperlipidemia); Internal hemorrhoids without mention of complication; Irritable bowel syndrome; and Other specified gastritis without mention of hemorrhage.   reports that he has never smoked. He has never used smokeless tobacco.  Past Surgical History:  Procedure Laterality Date  . CARPAL TUNNEL RELEASE     left  . CATARACT EXTRACTION  06/2011   bilateral  . [place]   x 2  . LUMBAR LAMINECTOMY/DECOMPRESSION MICRODISCECTOMY Left 07/11/2013   Procedure: LUMBAR ONE TO TWO, LUMBAR TWO TO THREE, LUMBAR THREE TO FOUR LUMBAR LAMINECTOMY/DECOMPRESSION  MICRODISCECTOMY 3 LEVELS;  Surgeon: Charlie Pitter, MD;  Location: [facility];  Service: Neurosurgery;  Laterality: Left;    No Known Allergies  Immunization History  Administered Date(s) Administered  . Influenza, High Dose Seasonal PF 03/06/2017  . Influenza,inj,Quad PF,6+ Mos 04/05/2016  . Pneumococcal-Unspecified 04/05/2016  . Zoster Recombinat (Shingrix) 01/03/2017    Family History  Problem Relation Age of Onset  . Heart failure Father 81  . Colon cancer Neg Hx   . Pancreatic cancer Neg Hx   . Rectal cancer Neg Hx   . Stomach cancer Neg Hx      Current Outpatient Prescriptions:  .  acetaminophen-codeine (TYLENOL #3) 300-30 MG per tablet, Take 1 tablet by mouth every 8 (eight) hours as needed for moderate pain. , Disp: , Rfl:  .  carisoprodol (SOMA) 350 MG tablet, Take 175 mg by mouth 3 (three) times daily as needed for muscle spasms. , Disp: , Rfl:  .  Cholecalciferol (VITAMIN D-3) 1000 UNITS CAPS, Take 1,000 Units by mouth daily., Disp: , Rfl:  .  CO-ENZYME Q-10 PO, Take 1 tablet by mouth daily., Disp: , Rfl:  .  dicyclomine (BENTYL) 10 MG capsule, Take one by mouth every 6 hours as needed for abdominal cramping, Disp: 30 capsule, Rfl: 1 .  diphenoxylate-atropine (LOMOTIL) 2.5-0.025 MG tablet, Take 1 tablet by mouth daily as needed., Disp: , Rfl:  .  ESBRIET 267 MG TABS, , Disp: , Rfl:  .  esomeprazole (NEXIUM) 40 MG capsule, Take 40 mg by mouth every evening. , Disp: , Rfl:  .  fexofenadine (ALLEGRA) 30 MG tablet, Take 30 mg by mouth daily as needed (allergies). , Disp: , Rfl:  .  fluticasone (FLONASE) 50 MCG/ACT nasal spray, Place 2 sprays into the nose daily as needed for allergies. , Disp: , Rfl:  .  Glucosamine-Chondroitin (GLUCOSAMINE CHONDR COMPLEX PO), Take 1 tablet by mouth daily., Disp: , Rfl:  .  HYDROcodone-acetaminophen (NORCO/VICODIN) 5-325 MG per tablet, Take 1-2 tablets by mouth every 6 (six) hours as needed for moderate pain., Disp: , Rfl:  .  meloxicam  (MOBIC) 7.5 MG tablet, Take 7.5 mg by mouth daily., Disp: , Rfl:  .  methocarbamol (ROBAXIN) 500 MG tablet, Take 1 tablet (500 mg total) by mouth every 8 (eight) hours as needed for muscle spasms., Disp: 40 tablet, Rfl: 0 .  rOPINIRole (REQUIP) 0.5 MG tablet, Take 0.5 mg by mouth at bedtime., Disp: , Rfl:  .  simvastatin (ZOCOR) 20 MG tablet, Take 20 mg by mouth every evening., Disp: , Rfl:    Review of Systems     Objective:   Physical Exam  Constitutional: He is oriented to person, place, and time. He appears well-developed and well-nourished. No distress.  HENT:  Head: Normocephalic and atraumatic.  Right Ear: External ear normal.  Left Ear: External ear normal.  Mouth/Throat: Oropharynx is clear and moist. No oropharyngeal exudate.  Eyes: Pupils are equal, round, and reactive to light. Conjunctivae and EOM are normal. Right eye exhibits no discharge. Left eye exhibits no discharge. No scleral icterus.  Neck: Normal range of motion. Neck supple. No JVD present. No tracheal deviation present. No thyromegaly present.  Cardiovascular: Normal rate, regular rhythm and intact distal pulses.  Exam reveals no gallop and no friction rub.   No murmur heard. Pulmonary/Chest: Effort normal. No respiratory distress. He has no wheezes. He has rales. He exhibits no tenderness.  Abdominal: Soft. Bowel sounds are normal. He exhibits no distension and no mass. There is no tenderness. There is no rebound and no guarding.  Musculoskeletal: Normal range of motion. He exhibits no edema or tenderness.  Lymphadenopathy:    He has no cervical adenopathy.  Neurological: He is alert and oriented to person, place, and time. He has normal reflexes. No cranial nerve deficit. Coordination normal.  Skin: Skin is warm and dry. No rash noted. He is not diaphoretic. No erythema. No pallor.  Psychiatric: He has a normal mood and affect. His behavior is normal. Judgment and thought content normal.  Nursing note and  vitals reviewed.  Vitals:   05/06/17 1114  BP: (!) 126/58  Pulse: 91  SpO2: 97%  Weight: 183 lb 3.2 oz (83.1 kg)  Height: _0  (1.778 m)    Estimated body mass index is 26.29 kg/m as calculated from the following:   Height as of this encounter: _1  (1.778 m).   Weight as of this encounter: 183 lb 3.2 oz (83.1 kg).     Assessment:       ICD-10-CM   1. 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HCC) J84.112 Hepatic function panel    Hepatic function panel  2. Encounter for therapeutic drug monitoring Z51.81 Hepatic function panel    Hepatic function panel       Plan:      Clinically stable Glad you are tolerating esbriet x 3 weeks other than mild nausea Glad you are applying sunscreen  Plan Check LFT 05/06/2017 and again in 1 months and in 2 months Continue esbriet  3 pills three times daily   - take with food, space atleast 4h between tablets In 2 months do Pre-bd spiro and dlco only. No lung volume or bd response.  Refer pulmonary rehab  Followup - call if any side effects - return in 2 months but after above   > 50% of this > 25 min visit spent in face to face counseling or coordination of care    Dr. Brand Males, M.D., [facility].C.P Pulmonary and Critical Care Medicine Staff Physician [place] Pulmonary and Critical Care Pager: [phone], If no answer or between  15:00h - 7:00h: call [phone]  05/06/2017 11:41 AM

## 2017-05-07 ENCOUNTER — Telehealth (HOSPITAL_COMMUNITY): Payer: Self-pay

## 2017-05-07 DIAGNOSIS — M48061 Spinal stenosis, lumbar region without neurogenic claudication: Secondary | ICD-10-CM | POA: Diagnosis not present

## 2017-05-07 NOTE — Telephone Encounter (Signed)
Called and spoke with patient in regards to Pulmonary Rehab - Patient is interested. Scheduled orientation on 05/21/17 at 9:30am. Patient will attend the 10:30am exc class.

## 2017-05-07 NOTE — Telephone Encounter (Signed)
Patients insurance is active and benefits verified with [employer]. $15 co-pay, no deductible, out of pocket amount is $3,400/$251.19 has been met, no co-insurance, and no pre-authorization is required. Reference #[id number]  Will contact patient to schedule for Pulmonary Rehab.

## 2017-05-10 ENCOUNTER — Ambulatory Visit: Payer: PPO | Admitting: Acute Care

## 2017-05-10 ENCOUNTER — Telehealth: Payer: Self-pay | Admitting: Internal Medicine

## 2017-05-10 MED ORDER — PREDNISONE 10 MG PO TABS: ORAL_TABLET | ORAL | 0 refills | Status: DC

## 2017-05-10 MED ORDER — CEPHALEXIN 500 MG PO TABS: 500.0000 mg | ORAL_TABLET | Freq: Three times a day (TID) | ORAL | 0 refills | Status: DC

## 2017-05-10 NOTE — Telephone Encounter (Signed)
Spoke with, who states he feels that he has developed sinusitis. Pt states he recently started Esbriet. Pt states he reached out to [employer], who states sinusitis is common side effect with Esbreit.  Pt reports of nasal congestion, voice hoariness, sinus pressure, body aches and chills x3d. Pt is requesting an abx.   MR please advise. Thanks.

## 2017-05-10 NOTE — Telephone Encounter (Signed)
1) sinusitis is NOT a common side effect of esbriet  2) Acute sinusitis - yes we need to Rx. Recommend be aggrressive  Plan - cephalexin 519m three times daily x  5 days  - Please take prednisone 40 mg x1 day, then 30 mg x1 day, then 20 mg x1 day, then 10 mg x1 day, and then 5 mg x1 day and stop   Dr. MBrand Males M.D., [facility] Pulmonary and Critical Care Medicine Staff Physician [facility] and [facility] Care Pager: [phone] If no answer or between  15:00h - 7:00h: call [phone]  05/10/2017 12:03 PM

## 2017-05-10 NOTE — Telephone Encounter (Signed)
Pt called back about the status of this message; pt contact # [phone]

## 2017-05-10 NOTE — Telephone Encounter (Signed)
Spoke with patient. He is aware of MR's recs. Medications had been called into his pharmacy. Nothing else needed at time of call.

## 2017-05-21 ENCOUNTER — Encounter (HOSPITAL_COMMUNITY)
Admission: RE | Admit: 2017-05-21 | Discharge: 2017-05-21 | Disposition: A | Discharge status: Home / Self Care | Payer: PPO | Source: Ambulatory Visit | Attending: Internal Medicine | Admitting: Internal Medicine

## 2017-05-21 ENCOUNTER — Encounter (HOSPITAL_COMMUNITY): Payer: Self-pay

## 2017-05-21 VITALS — BP 139/81 | HR 107 | Resp 18 | Ht 69.0 in | Wt 182.3 lb

## 2017-05-21 DIAGNOSIS — J84112 Idiopathic pulmonary fibrosis: Secondary | ICD-10-CM

## 2017-05-21 DIAGNOSIS — Z5181 Encounter for therapeutic drug level monitoring: Secondary | ICD-10-CM | POA: Diagnosis not present

## 2017-05-21 NOTE — Progress Notes (Signed)
Kenneth [name] 76 y.o. male Pulmonary Rehab Orientation Note Patient arrived today in Cardiac and Pulmonary Rehab for orientation to Pulmonary Rehab. He ambulated from the hospital entrance with little difficulty. He has not been prescribed oxygen for home or portable use. Color good, skin warm and dry. Patient is oriented to time and place. Patient's medical history, psychosocial health, and medications reviewed. Psychosocial assessment reveals pt lives with their spouse. 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 Pt hobbies include playing golf and being and active member of the community by sitting of various boards. Pt reports his stress level is low. Areas of stress/anxiety include Health. He has recently been diagnosed with IPF.  Pt does not exhibit signs of depression. PHQ2/9 score 0/na. Pt shows good  coping skills with positive outlook. He is offered emotional support and reassurance. Will continue to monitor and evaluate progress toward psychosocial goal(s) of gaining knowledge regarding IPF and remaining positive about the disease progression. Physical assessment reveals heart rate is tachycardic, breath sounds clear throughout left, scattered expiratory wheezes in right. Grip strength equal, strong. Distal pulses palpable. Patient reports he does take medications as prescribed. Patient states he follows a Regular diet. The patient reports no specific efforts to gain or lose weight.. Patient's weight will be monitored closely. Demonstration and practice of PLB using pulse oximeter. Patient able to return demonstration satisfactorily. Safety and hand hygiene in the exercise area reviewed with patient. Patient voices understanding of the information reviewed. Department expectations discussed with patient and achievable goals were set. The patient shows enthusiasm about attending the program and we look forward to working with this nice gentleman. The patient is scheduled for a 6 min walk test  on 05/25/17 and to begin exercise on 06/01/17 at 1030.   45 minutes was spent on a variety of activities such as assessment of the patient, obtaining baseline data including height, weight, BMI, and grip strength, verifying medical history, allergies, and current medications, and teaching patient strategies for performing tasks with less respiratory effort with emphasis on pursed lip breathing.

## 2017-05-24 DIAGNOSIS — H524 Presbyopia: Secondary | ICD-10-CM | POA: Diagnosis not present

## 2017-05-25 ENCOUNTER — Encounter (HOSPITAL_COMMUNITY)
Admission: RE | Admit: 2017-05-25 | Discharge: 2017-05-25 | Disposition: A | Discharge status: Home / Self Care | Payer: PPO | Source: Ambulatory Visit | Attending: Internal Medicine | Admitting: Internal Medicine

## 2017-05-25 DIAGNOSIS — Z5181 Encounter for therapeutic drug level monitoring: Secondary | ICD-10-CM | POA: Diagnosis not present

## 2017-05-25 DIAGNOSIS — J84112 Idiopathic pulmonary fibrosis: Secondary | ICD-10-CM

## 2017-05-26 DIAGNOSIS — J329 Chronic sinusitis, unspecified: Secondary | ICD-10-CM | POA: Diagnosis not present

## 2017-05-26 DIAGNOSIS — J330 Polyp of nasal cavity: Secondary | ICD-10-CM | POA: Diagnosis not present

## 2017-05-31 NOTE — Progress Notes (Signed)
Pulmonary Individual Treatment Plan  Patient Details  Name: Kenneth [name] MRN: [id number] Date of Birth: 09/17/1940 Referring Provider:     Pulmonary Rehab Walk Test from 05/25/2017 in [facility]  Referring Provider  Dr. Ramaswamy      Initial Encounter Date:    Pulmonary Rehab Walk Test from 05/25/2017 in [facility]  Date  05/25/17  Referring Provider  Dr. Ramaswamy      Visit Diagnosis: 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HCC)  Patient's Home Medications on Admission:   Current Outpatient Medications:  .  acetaminophen-codeine (TYLENOL #3) 300-30 MG per tablet, Take 1 tablet by mouth every 8 (eight) hours as needed for moderate pain. , Disp: , Rfl:  .  carisoprodol (SOMA) 350 MG tablet, Take 175 mg by mouth 3 (three) times daily as needed for muscle spasms. , Disp: , Rfl:  .  Cholecalciferol (VITAMIN D-3) 1000 UNITS CAPS, Take 1,000 Units by mouth daily., Disp: , Rfl:  .  CO-ENZYME Q-10 PO, Take 1 tablet by mouth daily., Disp: , Rfl:  .  dicyclomine (BENTYL) 10 MG capsule, Take one by mouth every 6 hours as needed for abdominal cramping, Disp: 30 capsule, Rfl: 1 .  diphenoxylate-atropine (LOMOTIL) 2.5-0.025 MG tablet, Take 1 tablet by mouth daily as needed., Disp: , Rfl:  .  ESBRIET 267 MG TABS, , Disp: , Rfl:  .  esomeprazole (NEXIUM) 40 MG capsule, Take 40 mg by mouth every evening. , Disp: , Rfl:  .  fexofenadine (ALLEGRA) 30 MG tablet, Take 30 mg by mouth daily as needed (allergies). , Disp: , Rfl:  .  fluticasone (FLONASE) 50 MCG/ACT nasal spray, Place 2 sprays into the nose daily as needed for allergies. , Disp: , Rfl:  .  Glucosamine-Chondroitin (GLUCOSAMINE CHONDR COMPLEX PO), Take 1 tablet by mouth daily., Disp: , Rfl:  .  HYDROcodone-acetaminophen (NORCO/VICODIN) 5-325 MG per tablet, Take 1-2 tablets by mouth every 6 (six) hours as needed for moderate pain., Disp: , Rfl:  .  meloxicam (MOBIC) 15 MG tablet,  TAKE 1 TABLET BY MOUTH EVERY DAY WITH A MEAL, Disp: , Rfl: 4 .  meloxicam (MOBIC) 7.5 MG tablet, Take 7.5 mg by mouth daily., Disp: , Rfl:  .  methocarbamol (ROBAXIN) 500 MG tablet, Take 1 tablet (500 mg total) by mouth every 8 (eight) hours as needed for muscle spasms., Disp: 40 tablet, Rfl: 0 .  rOPINIRole (REQUIP) 0.5 MG tablet, Take 0.5 mg by mouth at bedtime., Disp: , Rfl:  .  triamcinolone cream (KENALOG) 0.1 %, APPLY TO AFFECTED AREA, Disp: , Rfl: 12  Past Medical History: Past Medical History:  Diagnosis Date  . Arthritis   . Diverticulosis of colon (without mention of hemorrhage)   . GERD (gastroesophageal reflux disease)   . Hiatal hernia   . HLD (hyperlipidemia)   . Internal hemorrhoids without mention of complication   . Irritable bowel syndrome   . Other specified gastritis without mention of hemorrhage     Tobacco Use: Social History   Tobacco Use  Smoking Status Never Smoker  Smokeless Tobacco Never Used  Tobacco Comment   social    Labs: Recent Review Flowsheet Data    There is no flowsheet data to display.      Capillary Blood Glucose: No results found for: GLUCAP   Pulmonary Assessment Scores: Pulmonary Assessment Scores    Row Name 05/31/17 1411         ADL [facility]     ADL Phase  Entry       mMRC Score   mMRC Score  1        Pulmonary Function Assessment: Pulmonary Function Assessment - 05/21/17 1123      Breath   Bilateral Breath Sounds  Expiratory;Wheezes right side    Shortness of Breath  Yes;Limiting activity climbing stairs       Exercise Target Goals: Date: 05/25/17  Exercise Program Goal: Individual exercise prescription set with THRR, safety & activity barriers. Participant demonstrates ability to understand and report RPE using BORG scale, to self-measure pulse accurately, and to acknowledge the importance of the exercise prescription.  Exercise Prescription Goal: Starting with aerobic activity 30 plus minutes a day, 3 days  per week for initial exercise prescription. Provide home exercise prescription and guidelines that participant acknowledges understanding prior to discharge.  Activity Barriers & Risk Stratification: Activity Barriers & Cardiac Risk Stratification - 05/21/17 1058      Activity Barriers & Cardiac Risk Stratification   Activity Barriers  Other (comment);Back Problems;Shortness of Breath left upper leg nerve pain with activity       6 Minute Walk: 6 Minute Walk    Row Name 05/31/17 1406         6 Minute Walk   Phase  Initial     Distance  1770 feet     Walk Time  6 minutes     # of Rest Breaks  0     MPH  3.35     METS  3.53     RPE  12     Perceived Dyspnea   0     Symptoms  Yes (comment)     Comments  2/10 pain in back     Resting HR  102 bpm     Resting BP  110/60     Resting Oxygen Saturation   92 %     Exercise Oxygen Saturation  during 6 min walk  92 %     Max Ex. HR  120 bpm     Max Ex. BP  144/64       Interval HR   1 Minute HR  102     2 Minute HR  116     3 Minute HR  117     4 Minute HR  115     5 Minute HR  120     6 Minute HR  116     2 Minute Post HR  110     Interval Heart Rate?  Yes       Interval Oxygen   Interval Oxygen?  Yes     Baseline Oxygen Saturation %  92 %     1 Minute Oxygen Saturation %  93 %     1 Minute Liters of Oxygen  0 L     2 Minute Oxygen Saturation %  92 %     2 Minute Liters of Oxygen  0 L     3 Minute Oxygen Saturation %  93 %     3 Minute Liters of Oxygen  0 L     4 Minute Oxygen Saturation %  93 %     4 Minute Liters of Oxygen  0 L     5 Minute Oxygen Saturation %  92 %     5 Minute Liters of Oxygen  0 L     6 Minute Oxygen Saturation %  93 %     6  Minute Liters of Oxygen  0 L     2 Minute Post Oxygen Saturation %  95 %     2 Minute Post Liters of Oxygen  0 L        Oxygen Initial Assessment: Oxygen Initial Assessment - 05/31/17 1411      Initial 6 min Walk   Oxygen Used  None      Program Oxygen Prescription    Program Oxygen Prescription  None       Oxygen Re-Evaluation:   Oxygen Discharge (Final Oxygen Re-Evaluation):   Initial Exercise Prescription: Initial Exercise Prescription - 05/31/17 1400      Date of Initial Exercise RX and Referring Provider   Date  05/25/17    Referring Provider  Dr. [name]      Treadmill   MPH  1.7    Grade  0    Minutes  17      Bike   Level  0.6    Minutes  17      NuStep   Level  3    SPM  80    Minutes  17    METs  2      Prescription Details   Frequency (times per week)  2    Duration  Progress to 45 minutes of aerobic exercise without signs/symptoms of physical distress      Intensity   THRR 40-80% of Max Heartrate  58-116    Ratings of Perceived Exertion  11-13    Perceived Dyspnea  0-4      Progression   Progression  Continue progressive overload as per policy without signs/symptoms or physical distress.      Resistance Training   Training Prescription  Yes    Weight  blue bands    Reps  10-15       Perform Capillary Blood Glucose checks as needed.  Exercise Prescription Changes:   Exercise Comments:   Exercise Goals and Review: Exercise Goals    Row Name 05/21/17 1100             Exercise Goals   Increase Physical Activity  Yes       Intervention  Provide advice, education, support and counseling about physical activity/exercise needs.;Develop an individualized exercise prescription for aerobic and resistive training based on initial evaluation findings, risk stratification, comorbidities and participant's personal goals.       Expected Outcomes  Achievement of increased cardiorespiratory fitness and enhanced flexibility, muscular endurance and strength shown through measurements of functional capacity and personal statement of participant.       Increase Strength and Stamina  Yes       Intervention  Provide advice, education, support and counseling about physical activity/exercise needs.;Develop an  individualized exercise prescription for aerobic and resistive training based on initial evaluation findings, risk stratification, comorbidities and participant's personal goals.       Expected Outcomes  Achievement of increased cardiorespiratory fitness and enhanced flexibility, muscular endurance and strength shown through measurements of functional capacity and personal statement of participant.       Able to understand and use rate of perceived exertion (RPE) scale  Yes       Intervention  Provide education and explanation on how to use RPE scale       Expected Outcomes  Short Term: Able to use RPE daily in rehab to express subjective intensity level;Long Term:  Able to use RPE to guide intensity level when exercising independently  Able to understand and use Dyspnea scale  Yes       Intervention  Provide education and explanation on how to use Dyspnea scale       Expected Outcomes  Short Term: Able to use Dyspnea scale daily in rehab to express subjective sense of shortness of breath during exertion;Long Term: Able to use Dyspnea scale to guide intensity level when exercising independently       Knowledge and understanding of Target Heart Rate Range (THRR)  Yes       Intervention  Provide education and explanation of THRR including how the numbers were predicted and where they are located for reference       Expected Outcomes  Short Term: Able to state/look up THRR;Short Term: Able to use daily as guideline for intensity in rehab;Long Term: Able to use THRR to govern intensity when exercising independently       Understanding of Exercise Prescription  Yes       Intervention  Provide education, explanation, and written materials on patient's individual exercise prescription       Expected Outcomes  Short Term: Able to explain program exercise prescription;Long Term: Able to explain home exercise prescription to exercise independently          Exercise Goals Re-Evaluation :   Discharge  Exercise Prescription (Final Exercise Prescription Changes):   Nutrition:  Target Goals: Understanding of nutrition guidelines, daily intake of sodium <158m, cholesterol <2064m calories 30% from fat and 7% or less from saturated fats, daily to have 5 or more servings of fruits and vegetables.  Biometrics: Pre Biometrics - 05/21/17 1101      Pre Biometrics   Grip Strength  33 kg        Nutrition Therapy Plan and Nutrition Goals:   Nutrition Discharge: Rate Your Plate Scores:   Nutrition Goals Re-Evaluation:   Nutrition Goals Discharge (Final Nutrition Goals Re-Evaluation):   Psychosocial: Target Goals: Acknowledge presence or absence of significant depression and/or stress, maximize coping skills, provide positive support system. Participant is able to verbalize types and ability to use techniques and skills needed for reducing stress and depression.  Initial Review & Psychosocial Screening: Initial Psych Review & Screening - 05/21/17 1127      Initial Review   Current issues with  None Identified      Family Dynamics   Good Support System?  Yes      Barriers   Psychosocial barriers to participate in program  There are no identifiable barriers or psychosocial needs.      Screening Interventions   Interventions  Encouraged to exercise       Quality of Life Scores:   PHQ-9: Recent Review Flowsheet Data    [facility]   Decreased Interest 0   Down, Depressed, Hopeless 0   PHQ - 2 Score 0     Interpretation of Total Score  Total Score Depression Severity:  1-4 = Minimal depression, 5-9 = Mild depression, 10-14 = Moderate depression, 15-19 = Moderately severe depression, 20-27 = Severe depression   Psychosocial Evaluation and Intervention: Psychosocial Evaluation - 05/21/17 1128      Psychosocial Evaluation & Interventions   Interventions  Encouraged to exercise with the program and follow exercise prescription    Expected  Outcomes  patient will remain free from psychosocial barriers to participation in pulmonary rehab    Continue Psychosocial Services   No Follow up required       Psychosocial Re-Evaluation:  Psychosocial Discharge (Final Psychosocial Re-Evaluation):   Education: Education Goals: Education classes will be provided on a weekly basis, covering required topics. Participant will state understanding/return demonstration of topics presented.  Learning Barriers/Preferences: Learning Barriers/Preferences - 05/21/17 1119      Learning Barriers/Preferences   Learning Barriers  None    Learning Preferences  Written Material;Verbal Instruction;Group Instruction;Individual Instruction       Education Topics: Risk Factor Reduction:  -Group instruction that is supported by a PowerPoint presentation. Instructor discusses the definition of a risk factor, different risk factors for pulmonary disease, and how the heart and lungs work together.     Nutrition for Pulmonary Patient:  -Group instruction provided by PowerPoint slides, verbal discussion, and written materials to support subject matter. The instructor gives an explanation and review of healthy diet recommendations, which includes a discussion on weight management, recommendations for fruit and vegetable consumption, as well as protein, fluid, caffeine, fiber, sodium, sugar, and alcohol. Tips for eating when patients are short of breath are discussed.   Pursed Lip Breathing:  -Group instruction that is supported by demonstration and informational handouts. Instructor discusses the benefits of pursed lip and diaphragmatic breathing and detailed demonstration on how to preform both.     Oxygen Safety:  -Group instruction provided by PowerPoint, verbal discussion, and written material to support subject matter. There is an overview of "What is Oxygen" and "Why do we need it".  Instructor also reviews how to create a safe environment for  oxygen use, the importance of using oxygen as prescribed, and the risks of noncompliance. There is a brief discussion on traveling with oxygen and resources the patient may utilize.   Oxygen Equipment:  -Group instruction provided by [facility] Staff utilizing handouts, written materials, and equipment demonstrations.   Signs and Symptoms:  -Group instruction provided by written material and verbal discussion to support subject matter. Warning signs and symptoms of infection, stroke, and heart attack are reviewed and when to call the physician/911 reinforced. Tips for preventing the spread of infection discussed.   Advanced Directives:  -Group instruction provided by verbal instruction and written material to support subject matter. Instructor reviews Advanced Directive laws and proper instruction for filling out document.   Pulmonary Video:  -Group video education that reviews the importance of medication and oxygen compliance, exercise, good nutrition, pulmonary hygiene, and pursed lip and diaphragmatic breathing for the pulmonary patient.   Exercise for the Pulmonary Patient:  -Group instruction that is supported by a PowerPoint presentation. Instructor discusses benefits of exercise, core components of exercise, frequency, duration, and intensity of an exercise routine, importance of utilizing pulse oximetry during exercise, safety while exercising, and options of places to exercise outside of rehab.     Pulmonary Medications:  -Verbally interactive group education provided by instructor with focus on inhaled medications and proper administration.   Anatomy and Physiology of the Respiratory System and Intimacy:  -Group instruction provided by PowerPoint, verbal discussion, and written material to support subject matter. Instructor reviews respiratory cycle and anatomical components of the respiratory system and their functions. Instructor also reviews differences in obstructive and  restrictive respiratory diseases with examples of each. Intimacy, Sex, and Sexuality differences are reviewed with a discussion on how relationships can change when diagnosed with pulmonary disease. Common sexual concerns are reviewed.   MD DAY -A group question and answer session with a medical doctor that allows participants to ask questions that relate to their pulmonary disease state.   OTHER EDUCATION -Group or  individual verbal, written, or video instructions that support the educational goals of the pulmonary rehab program.   Knowledge Questionnaire Score:   Core Components/Risk Factors/Patient Goals at Admission: Personal Goals and Risk Factors at Admission - 05/21/17 1126      Core Components/Risk Factors/Patient Goals on Admission   Improve shortness of breath with ADL's  Yes    Intervention  Provide education, individualized exercise plan and daily activity instruction to help decrease symptoms of SOB with activities of daily living.    Expected Outcomes  Short Term: Achieves a reduction of symptoms when performing activities of daily living.    Develop more efficient breathing techniques such as purse lipped breathing and diaphragmatic breathing; and practicing self-pacing with activity  Yes    Intervention  Provide education, demonstration and support about specific breathing techniuqes utilized for more efficient breathing. Include techniques such as pursed lipped breathing, diaphragmatic breathing and self-pacing activity.    Expected Outcomes  Short Term: Participant will be able to demonstrate and use breathing techniques as needed throughout daily activities.    Increase knowledge of respiratory medications and ability to use respiratory devices properly   Yes    Intervention  Provide education and demonstration as needed of appropriate use of medications, inhalers, and oxygen therapy.    Expected Outcomes  Short Term: Achieves understanding of medications use. Understands  that oxygen is a medication prescribed by physician. Demonstrates appropriate use of inhaler and oxygen therapy.       Core Components/Risk Factors/Patient Goals Review:    Core Components/Risk Factors/Patient Goals at Discharge (Final Review):    ITP Comments:   Comments:

## 2017-06-01 ENCOUNTER — Emergency Department (HOSPITAL_COMMUNITY): Payer: PPO

## 2017-06-01 ENCOUNTER — Encounter (HOSPITAL_COMMUNITY)
Admission: RE | Admit: 2017-06-01 | Discharge: 2017-06-01 | Disposition: A | Discharge status: Home / Self Care | Payer: PPO | Source: Ambulatory Visit | Attending: Internal Medicine | Admitting: Internal Medicine

## 2017-06-01 ENCOUNTER — Encounter (HOSPITAL_COMMUNITY): Payer: Self-pay | Admitting: Emergency Medicine

## 2017-06-01 ENCOUNTER — Other Ambulatory Visit: Payer: Self-pay

## 2017-06-01 VITALS — Wt 183.0 lb

## 2017-06-01 DIAGNOSIS — R079 Chest pain, unspecified: Secondary | ICD-10-CM | POA: Diagnosis not present

## 2017-06-01 DIAGNOSIS — Z5321 Procedure and treatment not carried out due to patient leaving prior to being seen by health care provider: Secondary | ICD-10-CM | POA: Insufficient documentation

## 2017-06-01 DIAGNOSIS — J84112 Idiopathic pulmonary fibrosis: Secondary | ICD-10-CM

## 2017-06-01 DIAGNOSIS — Z5181 Encounter for therapeutic drug level monitoring: Secondary | ICD-10-CM | POA: Diagnosis not present

## 2017-06-01 LAB — CBC
HCT: 35.1 % — ABNORMAL LOW (ref 39.0–52.0)
Hemoglobin: 12.4 g/dL — ABNORMAL LOW (ref 13.0–17.0)
MCH: 32.2 pg (ref 26.0–34.0)
MCHC: 35.3 g/dL (ref 30.0–36.0)
MCV: 91.2 fL (ref 78.0–100.0)
Platelets: 175 10*3/uL (ref 150–400)
RBC: 3.85 MIL/uL — ABNORMAL LOW (ref 4.22–5.81)
RDW: 12.9 % (ref 11.5–15.5)
WBC: 4.7 10*3/uL (ref 4.0–10.5)

## 2017-06-01 LAB — BASIC METABOLIC PANEL
Anion gap: 8 (ref 5–15)
BUN: 11 mg/dL (ref 6–20)
CO2: 26 mmol/L (ref 22–32)
Calcium: 8.8 mg/dL — ABNORMAL LOW (ref 8.9–10.3)
Chloride: 97 mmol/L — ABNORMAL LOW (ref 101–111)
Creatinine, Ser: 0.81 mg/dL (ref 0.61–1.24)
GFR calc Af Amer: >60 mL/min (ref >60)
GFR calc non Af Amer: >60 mL/min (ref >60)
Glucose, Bld: 174 mg/dL — ABNORMAL HIGH (ref 65–99)
Potassium: 3.6 mmol/L (ref 3.5–5.1)
Sodium: 131 mmol/L — ABNORMAL LOW (ref 135–145)

## 2017-06-01 LAB — I-STAT TROPONIN, ED: Troponin i, poc: 0 ng/mL (ref 0.00–0.08)

## 2017-06-01 NOTE — Progress Notes (Signed)
Pulmonary Individual Treatment Plan  Patient Details  Name: Kenneth [name] MRN: [id number] Date of Birth: 13-Jan-1941 Referring Provider:     Pulmonary Rehab Walk Test from 05/25/2017 in [facility]  Referring Provider  Dr. [name]      Initial Encounter Date:    Pulmonary Rehab Walk Test from 05/25/2017 in [facility]  Date  05/25/17  Referring Provider  Dr. [name]      Visit Diagnosis: 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[place])  Patient's Home Medications on Admission:   Current Outpatient Medications:  .  acetaminophen-codeine (TYLENOL #3) 300-30 MG per tablet, Take 1 tablet by mouth every 8 (eight) hours as needed for moderate pain. , Disp: , Rfl:  .  carisoprodol (SOMA) 350 MG tablet, Take 175 mg by mouth 3 (three) times daily as needed for muscle spasms. , Disp: , Rfl:  .  Cholecalciferol (VITAMIN D-3) 1000 UNITS CAPS, Take 1,000 Units by mouth daily., Disp: , Rfl:  .  CO-ENZYME Q-10 PO, Take 1 tablet by mouth daily., Disp: , Rfl:  .  dicyclomine (BENTYL) 10 MG capsule, Take one by mouth every 6 hours as needed for abdominal cramping, Disp: 30 capsule, Rfl: 1 .  diphenoxylate-atropine (LOMOTIL) 2.5-0.025 MG tablet, Take 1 tablet by mouth daily as needed., Disp: , Rfl:  .  ESBRIET 267 MG TABS, , Disp: , Rfl:  .  esomeprazole (NEXIUM) 40 MG capsule, Take 40 mg by mouth every evening. , Disp: , Rfl:  .  fexofenadine (ALLEGRA) 30 MG tablet, Take 30 mg by mouth daily as needed (allergies). , Disp: , Rfl:  .  fluticasone (FLONASE) 50 MCG/ACT nasal spray, Place 2 sprays into the nose daily as needed for allergies. , Disp: , Rfl:  .  Glucosamine-Chondroitin (GLUCOSAMINE CHONDR COMPLEX PO), Take 1 tablet by mouth daily., Disp: , Rfl:  .  HYDROcodone-acetaminophen (NORCO/VICODIN) 5-325 MG per tablet, Take 1-2 tablets by mouth every 6 (six) hours as needed for moderate pain., Disp: , Rfl:  .  meloxicam (MOBIC) 15 MG tablet,  TAKE 1 TABLET BY MOUTH EVERY DAY WITH A MEAL, Disp: , Rfl: 4 .  meloxicam (MOBIC) 7.5 MG tablet, Take 7.5 mg by mouth daily., Disp: , Rfl:  .  methocarbamol (ROBAXIN) 500 MG tablet, Take 1 tablet (500 mg total) by mouth every 8 (eight) hours as needed for muscle spasms., Disp: 40 tablet, Rfl: 0 .  rOPINIRole (REQUIP) 0.5 MG tablet, Take 0.5 mg by mouth at bedtime., Disp: , Rfl:  .  triamcinolone cream (KENALOG) 0.1 %, APPLY TO AFFECTED AREA, Disp: , Rfl: 12  Past Medical History: Past Medical History:  Diagnosis Date  . Arthritis   . Diverticulosis of colon (without mention of hemorrhage)   . GERD (gastroesophageal reflux disease)   . Hiatal hernia   . HLD (hyperlipidemia)   . Internal hemorrhoids without mention of complication   . Irritable bowel syndrome   . Other specified gastritis without mention of hemorrhage     Tobacco Use: Social History   Tobacco Use  Smoking Status Never Smoker  Smokeless Tobacco Never Used  Tobacco Comment   social    Labs: Recent Review Flowsheet Data    There is no flowsheet data to display.      Capillary Blood Glucose: No results found for: GLUCAP   Pulmonary Assessment Scores: Pulmonary Assessment Scores    Row Name 05/31/17 1411         ADL [facility]SD  ADL Phase  Entry       mMRC Score   mMRC Score  1        Pulmonary Function Assessment: Pulmonary Function Assessment - 05/21/17 1123      Breath   Bilateral Breath Sounds  Expiratory;Wheezes right side    Shortness of Breath  Yes;Limiting activity climbing stairs       Exercise Target Goals: Date: 05/25/17  Exercise Program Goal: Individual exercise prescription set with THRR, safety & activity barriers. Participant demonstrates ability to understand and report RPE using BORG scale, to self-measure pulse accurately, and to acknowledge the importance of the exercise prescription.  Exercise Prescription Goal: Starting with aerobic activity 30 plus minutes a day, 3 days  per week for initial exercise prescription. Provide home exercise prescription and guidelines that participant acknowledges understanding prior to discharge.  Activity Barriers & Risk Stratification: Activity Barriers & Cardiac Risk Stratification - 05/21/17 1058      Activity Barriers & Cardiac Risk Stratification   Activity Barriers  Other (comment);Back Problems;Shortness of Breath left upper leg nerve pain with activity       6 Minute Walk: 6 Minute Walk    Row Name 05/31/17 1406         6 Minute Walk   Phase  Initial     Distance  1770 feet     Walk Time  6 minutes     # of Rest Breaks  0     MPH  3.35     METS  3.53     RPE  12     Perceived Dyspnea   0     Symptoms  Yes (comment)     Comments  2/10 pain in back     Resting HR  102 bpm     Resting BP  110/60     Resting Oxygen Saturation   92 %     Exercise Oxygen Saturation  during 6 min walk  92 %     Max Ex. HR  120 bpm     Max Ex. BP  144/64       Interval HR   1 Minute HR  102     2 Minute HR  116     3 Minute HR  117     4 Minute HR  115     5 Minute HR  120     6 Minute HR  116     2 Minute Post HR  110     Interval Heart Rate?  Yes       Interval Oxygen   Interval Oxygen?  Yes     Baseline Oxygen Saturation %  92 %     1 Minute Oxygen Saturation %  93 %     1 Minute Liters of Oxygen  0 L     2 Minute Oxygen Saturation %  92 %     2 Minute Liters of Oxygen  0 L     3 Minute Oxygen Saturation %  93 %     3 Minute Liters of Oxygen  0 L     4 Minute Oxygen Saturation %  93 %     4 Minute Liters of Oxygen  0 L     5 Minute Oxygen Saturation %  92 %     5 Minute Liters of Oxygen  0 L     6 Minute Oxygen Saturation %  93 %     6  Minute Liters of Oxygen  0 L     2 Minute Post Oxygen Saturation %  95 %     2 Minute Post Liters of Oxygen  0 L        Oxygen Initial Assessment: Oxygen Initial Assessment - 05/31/17 1411      Initial 6 min Walk   Oxygen Used  None      Program Oxygen Prescription    Program Oxygen Prescription  None       Oxygen Re-Evaluation:   Oxygen Discharge (Final Oxygen Re-Evaluation):   Initial Exercise Prescription: Initial Exercise Prescription - 05/31/17 1400      Date of Initial Exercise RX and Referring Provider   Date  05/25/17    Referring Provider  Dr. [name]      Treadmill   MPH  1.7    Grade  0    Minutes  17      Bike   Level  0.6    Minutes  17      NuStep   Level  3    SPM  80    Minutes  17    METs  2      Prescription Details   Frequency (times per week)  2    Duration  Progress to 45 minutes of aerobic exercise without signs/symptoms of physical distress      Intensity   THRR 40-80% of Max Heartrate  58-116    Ratings of Perceived Exertion  11-13    Perceived Dyspnea  0-4      Progression   Progression  Continue progressive overload as per policy without signs/symptoms or physical distress.      Resistance Training   Training Prescription  Yes    Weight  blue bands    Reps  10-15       Perform Capillary Blood Glucose checks as needed.  Exercise Prescription Changes:   Exercise Comments:   Exercise Goals and Review: Exercise Goals    Row Name 05/21/17 1100             Exercise Goals   Increase Physical Activity  Yes       Intervention  Provide advice, education, support and counseling about physical activity/exercise needs.;Develop an individualized exercise prescription for aerobic and resistive training based on initial evaluation findings, risk stratification, comorbidities and participant's personal goals.       Expected Outcomes  Achievement of increased cardiorespiratory fitness and enhanced flexibility, muscular endurance and strength shown through measurements of functional capacity and personal statement of participant.       Increase Strength and Stamina  Yes       Intervention  Provide advice, education, support and counseling about physical activity/exercise needs.;Develop an  individualized exercise prescription for aerobic and resistive training based on initial evaluation findings, risk stratification, comorbidities and participant's personal goals.       Expected Outcomes  Achievement of increased cardiorespiratory fitness and enhanced flexibility, muscular endurance and strength shown through measurements of functional capacity and personal statement of participant.       Able to understand and use rate of perceived exertion (RPE) scale  Yes       Intervention  Provide education and explanation on how to use RPE scale       Expected Outcomes  Short Term: Able to use RPE daily in rehab to express subjective intensity level;Long Term:  Able to use RPE to guide intensity level when exercising independently  Able to understand and use Dyspnea scale  Yes       Intervention  Provide education and explanation on how to use Dyspnea scale       Expected Outcomes  Short Term: Able to use Dyspnea scale daily in rehab to express subjective sense of shortness of breath during exertion;Long Term: Able to use Dyspnea scale to guide intensity level when exercising independently       Knowledge and understanding of Target Heart Rate Range (THRR)  Yes       Intervention  Provide education and explanation of THRR including how the numbers were predicted and where they are located for reference       Expected Outcomes  Short Term: Able to state/look up THRR;Short Term: Able to use daily as guideline for intensity in rehab;Long Term: Able to use THRR to govern intensity when exercising independently       Understanding of Exercise Prescription  Yes       Intervention  Provide education, explanation, and written materials on patient's individual exercise prescription       Expected Outcomes  Short Term: Able to explain program exercise prescription;Long Term: Able to explain home exercise prescription to exercise independently          Exercise Goals Re-Evaluation :   Discharge  Exercise Prescription (Final Exercise Prescription Changes):   Nutrition:  Target Goals: Understanding of nutrition guidelines, daily intake of sodium <1552m, cholesterol <2020m calories 30% from fat and 7% or less from saturated fats, daily to have 5 or more servings of fruits and vegetables.  Biometrics: Pre Biometrics - 05/21/17 1101      Pre Biometrics   Grip Strength  33 kg        Nutrition Therapy Plan and Nutrition Goals:   Nutrition Discharge: Rate Your Plate Scores:   Nutrition Goals Re-Evaluation:   Nutrition Goals Discharge (Final Nutrition Goals Re-Evaluation):   Psychosocial: Target Goals: Acknowledge presence or absence of significant depression and/or stress, maximize coping skills, provide positive support system. Participant is able to verbalize types and ability to use techniques and skills needed for reducing stress and depression.  Initial Review & Psychosocial Screening: Initial Psych Review & Screening - 05/21/17 1127      Initial Review   Current issues with  None Identified      Family Dynamics   Good Support System?  Yes      Barriers   Psychosocial barriers to participate in program  There are no identifiable barriers or psychosocial needs.      Screening Interventions   Interventions  Encouraged to exercise       Quality of Life Scores:   PHQ-9: Recent Review Flowsheet Data    [facility]   Decreased Interest 0   Down, Depressed, Hopeless 0   PHQ - 2 Score 0     Interpretation of Total Score  Total Score Depression Severity:  1-4 = Minimal depression, 5-9 = Mild depression, 10-14 = Moderate depression, 15-19 = Moderately severe depression, 20-27 = Severe depression   Psychosocial Evaluation and Intervention: Psychosocial Evaluation - 05/21/17 1128      Psychosocial Evaluation & Interventions   Interventions  Encouraged to exercise with the program and follow exercise prescription    Expected  Outcomes  patient will remain free from psychosocial barriers to participation in pulmonary rehab    Continue Psychosocial Services   No Follow up required       Psychosocial Re-Evaluation:  Psychosocial Discharge (Final Psychosocial Re-Evaluation):   Education: Education Goals: Education classes will be provided on a weekly basis, covering required topics. Participant will state understanding/return demonstration of topics presented.  Learning Barriers/Preferences: Learning Barriers/Preferences - 05/21/17 1119      Learning Barriers/Preferences   Learning Barriers  None    Learning Preferences  Written Material;Verbal Instruction;Group Instruction;Individual Instruction       Education Topics: Risk Factor Reduction:  -Group instruction that is supported by a PowerPoint presentation. Instructor discusses the definition of a risk factor, different risk factors for pulmonary disease, and how the heart and lungs work together.     Nutrition for Pulmonary Patient:  -Group instruction provided by PowerPoint slides, verbal discussion, and written materials to support subject matter. The instructor gives an explanation and review of healthy diet recommendations, which includes a discussion on weight management, recommendations for fruit and vegetable consumption, as well as protein, fluid, caffeine, fiber, sodium, sugar, and alcohol. Tips for eating when patients are short of breath are discussed.   Pursed Lip Breathing:  -Group instruction that is supported by demonstration and informational handouts. Instructor discusses the benefits of pursed lip and diaphragmatic breathing and detailed demonstration on how to preform both.     Oxygen Safety:  -Group instruction provided by PowerPoint, verbal discussion, and written material to support subject matter. There is an overview of "What is Oxygen" and "Why do we need it".  Instructor also reviews how to create a safe environment for  oxygen use, the importance of using oxygen as prescribed, and the risks of noncompliance. There is a brief discussion on traveling with oxygen and resources the patient may utilize.   Oxygen Equipment:  -Group instruction provided by [facility] Staff utilizing handouts, written materials, and equipment demonstrations.   Signs and Symptoms:  -Group instruction provided by written material and verbal discussion to support subject matter. Warning signs and symptoms of infection, stroke, and heart attack are reviewed and when to call the physician/911 reinforced. Tips for preventing the spread of infection discussed.   Advanced Directives:  -Group instruction provided by verbal instruction and written material to support subject matter. Instructor reviews Advanced Directive laws and proper instruction for filling out document.   Pulmonary Video:  -Group video education that reviews the importance of medication and oxygen compliance, exercise, good nutrition, pulmonary hygiene, and pursed lip and diaphragmatic breathing for the pulmonary patient.   Exercise for the Pulmonary Patient:  -Group instruction that is supported by a PowerPoint presentation. Instructor discusses benefits of exercise, core components of exercise, frequency, duration, and intensity of an exercise routine, importance of utilizing pulse oximetry during exercise, safety while exercising, and options of places to exercise outside of rehab.     Pulmonary Medications:  -Verbally interactive group education provided by instructor with focus on inhaled medications and proper administration.   Anatomy and Physiology of the Respiratory System and Intimacy:  -Group instruction provided by PowerPoint, verbal discussion, and written material to support subject matter. Instructor reviews respiratory cycle and anatomical components of the respiratory system and their functions. Instructor also reviews differences in obstructive and  restrictive respiratory diseases with examples of each. Intimacy, Sex, and Sexuality differences are reviewed with a discussion on how relationships can change when diagnosed with pulmonary disease. Common sexual concerns are reviewed.   MD DAY -A group question and answer session with a medical doctor that allows participants to ask questions that relate to their pulmonary disease state.   OTHER EDUCATION -Group or  individual verbal, written, or video instructions that support the educational goals of the pulmonary rehab program.   Knowledge Questionnaire Score:   Core Components/Risk Factors/Patient Goals at Admission: Personal Goals and Risk Factors at Admission - 05/21/17 1126      Core Components/Risk Factors/Patient Goals on Admission   Improve shortness of breath with ADL's  Yes    Intervention  Provide education, individualized exercise plan and daily activity instruction to help decrease symptoms of SOB with activities of daily living.    Expected Outcomes  Short Term: Achieves a reduction of symptoms when performing activities of daily living.    Develop more efficient breathing techniques such as purse lipped breathing and diaphragmatic breathing; and practicing self-pacing with activity  Yes    Intervention  Provide education, demonstration and support about specific breathing techniuqes utilized for more efficient breathing. Include techniques such as pursed lipped breathing, diaphragmatic breathing and self-pacing activity.    Expected Outcomes  Short Term: Participant will be able to demonstrate and use breathing techniques as needed throughout daily activities.    Increase knowledge of respiratory medications and ability to use respiratory devices properly   Yes    Intervention  Provide education and demonstration as needed of appropriate use of medications, inhalers, and oxygen therapy.    Expected Outcomes  Short Term: Achieves understanding of medications use. Understands  that oxygen is a medication prescribed by physician. Demonstrates appropriate use of inhaler and oxygen therapy.       Core Components/Risk Factors/Patient Goals Review:    Core Components/Risk Factors/Patient Goals at Discharge (Final Review):    ITP Comments:   Comments:

## 2017-06-01 NOTE — Progress Notes (Signed)
Daily Session Note  Patient Details  Name: Kenneth [name] MRN: [id number] Date of Birth: [birth year] Referring Provider:     Pulmonary Rehab Walk Test from 05/25/2017 in [place]  Referring Provider  Dr. [name]      Encounter Date: 06/01/2017  Check In: Session Check In - 06/01/17 1030      Check-In   Location  [facility]    Staff Present  Rosebud Poles, RN, BSN;Wyeth Hoffer, MS, ACSM RCEP, Exercise Physiologist;Lisa Ysidro Evert, RN;Portia Rollene Rotunda, RN, BSN    Supervising physician immediately available to respond to emergencies  [facility] immediately available    Physician(s)  Dr. Maylene Roes    Medication changes reported      No    Fall or balance concerns reported     No    Tobacco Cessation  No Change    Warm-up and Cool-down  Performed as group-led instruction    Resistance Training Performed  Yes    VAD Patient?  No      Pain Assessment   Currently in Pain?  No/denies    Multiple Pain Sites  No       Capillary Blood Glucose: No results found for this or any previous visit (from the past 24 hour(s)).  Exercise Prescription Changes - 06/01/17 1200      Response to Exercise   Blood Pressure (Admit)  124/76    Blood Pressure (Exercise)  138/80    Blood Pressure (Exit)  150/60    Heart Rate (Admit)  89 bpm    Heart Rate (Exercise)  122 bpm    Heart Rate (Exit)  120 bpm    Oxygen Saturation (Admit)  97 %    Oxygen Saturation (Exercise)  92 %    Oxygen Saturation (Exit)  96 %    Rating of Perceived Exertion (Exercise)  12    Perceived Dyspnea (Exercise)  2    Duration  Progress to 45 minutes of aerobic exercise without signs/symptoms of physical distress    Intensity  THRR unchanged      Progression   Progression  Continue to progress workloads to maintain intensity without signs/symptoms of physical distress.      Resistance Training   Training Prescription  Yes    Weight  blue bands    Reps  10-15    Time   10 Minutes      Treadmill   MPH  2.8    Grade  3    Minutes  17      Bike   Level  1.6    Minutes  17      NuStep   Level  3    SPM  80    Minutes  17    METs  2.7       Social History   Tobacco Use  Smoking Status Never Smoker  Smokeless Tobacco Never Used  Tobacco Comment   social    Goals Met:  Exercise tolerated well No report of cardiac concerns or symptoms Strength training completed today  Goals Unmet:  Not Applicable  Comments: Service time is from 10:30a to 12:15p    Dr. Rush Farmer is Medical Director for Pulmonary Rehab at [facility].

## 2017-06-01 NOTE — ED Triage Notes (Signed)
Patient arrives with complaint of mid chest sub-sternal pain. Onset today @ 1600. States he was at rest when pain started. History of GERD, but states this doesn't feel the same. Has taken antacids without effect.

## 2017-06-01 NOTE — Progress Notes (Signed)
Called patient as he requested to be discharged from pulmonary rehab related to the times and days not being convenient for him. Will discharge as requested.

## 2017-06-02 ENCOUNTER — Emergency Department (HOSPITAL_COMMUNITY)
Admission: EM | Admit: 2017-06-02 | Discharge: 2017-06-02 | Disposition: A | Discharge status: Home / Self Care | Payer: PPO | Attending: Emergency Medicine | Admitting: Emergency Medicine

## 2017-06-02 ENCOUNTER — Encounter (HOSPITAL_COMMUNITY): Payer: Self-pay | Admitting: *Deleted

## 2017-06-02 HISTORY — DX: Pulmonary fibrosis, unspecified: J84.10

## 2017-06-02 NOTE — ED Notes (Signed)
Pt is leaving without being seen.

## 2017-06-02 NOTE — ED Notes (Signed)
Pt states that he is leaving due to wait times

## 2017-06-03 ENCOUNTER — Encounter (HOSPITAL_COMMUNITY): Payer: PPO

## 2017-06-03 DIAGNOSIS — E7849 Other hyperlipidemia: Secondary | ICD-10-CM | POA: Diagnosis not present

## 2017-06-08 ENCOUNTER — Encounter (HOSPITAL_COMMUNITY): Payer: PPO

## 2017-06-10 ENCOUNTER — Encounter (HOSPITAL_COMMUNITY): Payer: PPO

## 2017-06-15 ENCOUNTER — Encounter (HOSPITAL_COMMUNITY): Payer: PPO

## 2017-06-17 ENCOUNTER — Encounter (HOSPITAL_COMMUNITY): Payer: PPO

## 2017-06-21 ENCOUNTER — Telehealth: Payer: Self-pay | Admitting: Internal Medicine

## 2017-06-21 NOTE — Telephone Encounter (Signed)
She is requesting that we call the patient back directly at [phone].

## 2017-06-22 ENCOUNTER — Encounter (HOSPITAL_COMMUNITY): Payer: Self-pay

## 2017-06-22 ENCOUNTER — Telehealth: Payer: Self-pay | Admitting: Internal Medicine

## 2017-06-22 ENCOUNTER — Other Ambulatory Visit (INDEPENDENT_AMBULATORY_CARE_PROVIDER_SITE_OTHER): Payer: PPO

## 2017-06-22 ENCOUNTER — Encounter: Payer: Self-pay | Admitting: Internal Medicine

## 2017-06-22 ENCOUNTER — Ambulatory Visit (INDEPENDENT_AMBULATORY_CARE_PROVIDER_SITE_OTHER): Payer: PPO | Admitting: Internal Medicine

## 2017-06-22 ENCOUNTER — Encounter (HOSPITAL_COMMUNITY): Payer: PPO

## 2017-06-22 VITALS — BP 144/78 | HR 87 | Ht 69.5 in | Wt 184.0 lb

## 2017-06-22 DIAGNOSIS — J84112 Idiopathic pulmonary fibrosis: Secondary | ICD-10-CM

## 2017-06-22 DIAGNOSIS — Z5181 Encounter for therapeutic drug level monitoring: Secondary | ICD-10-CM

## 2017-06-22 DIAGNOSIS — R110 Nausea: Secondary | ICD-10-CM | POA: Diagnosis not present

## 2017-06-22 LAB — HEPATIC FUNCTION PANEL
ALT: 18 U/L (ref 0–53)
AST: 20 U/L (ref 0–37)
Albumin: 4.5 g/dL (ref 3.5–5.2)
Alkaline Phosphatase: 50 U/L (ref 39–117)
Bilirubin, Direct: 0.1 mg/dL (ref 0.0–0.3)
Total Bilirubin: 0.5 mg/dL (ref 0.2–1.2)
Total Protein: 7.2 g/dL (ref 6.0–8.3)

## 2017-06-22 LAB — PULMONARY FUNCTION TEST
DL/VA % pred: 83 %
DL/VA: 3.79 ml/min/mmHg/L
DLCO cor % pred: 60 %
DLCO cor: 19.24 ml/min/mmHg
DLCO unc % pred: 62 %
DLCO unc: 19.77 ml/min/mmHg
FEF 25-75 Pre: 3.33 L/sec
FEF2575-%Pred-Pre: 154 %
FEV1-%Pred-Pre: 100 %
FEV1-Pre: 2.99 L
FEV1FVC-%Pred-Pre: 112 %
FEV6-%Pred-Pre: 95 %
FEV6-Pre: 3.67 L
FEV6FVC-%Pred-Pre: 106 %
FVC-%Pred-Pre: 89 %
FVC-Pre: 3.7 L
Pre FEV1/FVC ratio: 81 %
Pre FEV6/FVC Ratio: 99 %

## 2017-06-22 NOTE — Telephone Encounter (Signed)
Per Daneil Dan: In order for patient to receive more info regarding patient assistance, the patient needs to contact their specialty pharmacy where they get their Esbriet mailed from, who will provide them with financial assistance options.   lmtcb X1 for pt to make aware of recs.

## 2017-06-22 NOTE — Patient Instructions (Addendum)
ICD-10-CM   1. 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[place]) J84.112   2. Nausea R11.0   3. Encounter for therapeutic drug monitoring Z51.81    #IPF - stable disease -continue esbriet 3 pills three times a day - repeat Pre-bd spiro and dlco only. No lung volume or bd response. X in 3 months - CT scan not needed on schedule for monitoring but given 6m subpleural node in august 2018 scan we can look at a repeat CT in fall 2019 but we can decide this later - continue daily exercises - you should monitor pulse ox (there is app based device as well)   #nausea and encounter for drug  monitoring  - likely due to esbriit - ensure 5h between dosages  - try ginger capsules or raw ginger as needed - monitor for foods that cause nausea  - hydrate yourself - check LFT 06/22/2017  - will see what we can do to help with copay  #Followup - every month do LFT at our lab and we will call with result - return in 3 months but after spirometry

## 2017-06-22 NOTE — Progress Notes (Signed)
PFT done today.

## 2017-06-22 NOTE — Progress Notes (Signed)
Subjective:     Patient ID: Kenneth [name], male   DOB: 09-03-1940, 76 y.o.   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 He tells me that this summer 2018*noticing insidious onset of shortness of breath particularly with walking stairs in the house or [place]. This was not that in the previous years. The summer 2018 he was in [place] but did not notice much shortness of breath. Otherwise he is able to do his activities of daily living and played golf with a car. He is more bothered by his back pain and sciatica as a result of previous back surgery. Shortness of breath is extremely mild. He says he became more aware of it after the radiologic investigations documented below. He had a chest x-ray that suggested interstitial findings and therefore he underwent a high-resolution CT scan of the chest that is described below. I personally visualized this high resolution CT chest and to me it shows bilateral bibasal subpleural reticulation that also extends to the upper lobes without any zonal predominance. There is no obvious honeycombing but there is traction bronchiectasis. There no mediastinal adenopathy. Therefore he is been referred here. In terms of exposure history other than radiation exposure he has not been on any pulmonary toxic drugs or any mold exposure or asbestos exposure. Does have GERD and is on PPI and is well ocntrolled   Does have spring and perennial allergies - sneezes easy. Allergy test with [place] negative some years ago  [employer] chest physicians interstitial lung disease questionnaire: He says that he does not cough. He is only trouble with dyspnea with strenuous exercise and it started 4 months ago. Past medical history significant for acid reflux. He has  never smoked any tobacco or street drugs. Does not have any family history of lung disease. At home he does not have any humidifier sound birds heart the water damage or mold. 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 He has standard radiation exposure while at work. The standard radiation for a radiologist. He does not take any pulmonary toxic drugs. House is 75 years old and he worries about mold. He sneezes if air is forced   Pulmonary function test shows isolated reduction in diffusion capacity to 62%. Walking desaturation test underneath her feet 3 laps on room air: Resting pulse ox 96%. Final pulse ox 95%. Heart rate resting was 92/m and went up to 109 minute. Results for Kenneth, [name] (MRN [id number]) as of 03/23/2017 10:04  Ref. Range 03/04/2017 09:13  FVC-Pre Latest Units: L 3.59  FVC-%Pred-Pre Latest Units: % 86  Results for Kenneth, [name] (MRN [id number]) as of 03/23/2017 10:04  Ref. Range 03/04/2017 09:13  DLCO cor Latest Units: ml/min/mmHg 20.06  DLCO cor % pred Latest Units: % 63    Lungs/Pleura: There is subpleural reticulation with scattered ground-glass and traction bronchiolectasis, without a definite zonal predominance. No definitive honeycombing. Findings appear mildly progressive when radiographs dating back to 12/28/2008 are reviewed. Probable 5 mm subpleural lymph node along the right major fissure. No pleural fluid. Airway is unremarkable.      IMPRESSION: 1. Pulmonary parenchymal findings of interstitial lung disease which may be due to fibrotic nonspecific interstitial pneumonitis or, given  slight progression over time, usual interstitial pneumonitis. 2. Aortic atherosclerosis (ICD10-170.0). Coronary artery calcification. 3. Aortic aneurysm NOS (ICD10-I71.9). 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 Recommend annual imaging followup by CTA or MRA. This recommendation follows  2010 ACCF/AHA/AATS/ACR/ASA/SCA/SCAI/SIR/STS/SVM Guidelines for the Diagnosis and Management of Patients with Thoracic Aortic Disease. Circulation. 2010; 121: H962-I297. 4. Prominence of the right and left main pulmonary arteries can be seen with pulmonary arterial hypertension.   Electronically Signed   By: [name]   On: 02/17/2017 13:41     OV 03/23/2017  Chief Complaint  Patient presents with  . Follow-up    SOB on exertion. Other than that he has been doing about the same. Denies any cough or CP.   Here to discuss results. [name]. 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 In inteirm no new symptoms.  Autoimmune profile negative. They raise possibility of 2nd opinion.  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 Progression is based on CXR comparison since 2010; there is no CT and is only suspicion of progression  Results for Kenneth, [name] (MRN [id number]) as of 03/23/2017 10:04  Ref. Range 12/08/2011 16:44 03/04/2017 13:47  Anit Nuclear Antibody(ANA) Latest Ref Range: NEGATIVE   NEG  Angiotensin-Converting Enzyme Latest Ref Range: 9 - 67 U/L  31  Cyclic Citrullin Peptide Ab Latest Units: Units  <16  ds DNA Ab Latest Units: IU/mL  <1  ENA RNP Ab Latest Ref Range: 0.0 - 0.9 AI  0.2  Endomysial Screen Latest Ref Range: NEGATIVE  NEGATIVE   Deamidated Gliadin Abs, IgG Latest Ref Range: <20 U/mL 4.1   Gliadin IgA Latest Ref Range: <20 U/mL 1.8   RA Latex Turbid. Latest Ref Range: <14 IU/mL  <14  Tissue Transglut Ab Latest Ref Range: <20 U/mL 3.9   Tissue Transglutaminase Ab, IgA Latest Ref Range: <20 U/mL 2.2   IgE (Immunoglobulin E), Serum Latest Ref Range: <115 kU/L  31  IgA Latest Ref Range: 68 - 379 mg/dL 132   SSA (Ro) (ENA) Antibody, IgG Latest Ref Range: <1.0 NEG AI   <1.0 NEG  SSB (La) (ENA) Antibody, IgG Latest Ref Range: <1.0 NEG AI   <1.0 NEG    OV  05/06/2017  Chief Complaint  Patient presents with  . Follow-up    Follow up today after beginning [place]. Pt has no c/o SOB, cough, or CP. Has had some mild nausea from the [place] but other than that, he has been doing good on it.   ollow-up idiopathic pulmonary fibrosis mild severity  He is here to follow-up He is on the third week of his bed at 3 pills 3 times a day max dose. So far is tolerating it fine other than mild nausea. He does take after meals. He plans to meet with [facility] coordinator for medication support. He did visit [place] this past weekend and did have vomiting especially after consuming few etoh drinks at a [employer]. He does apply sunscreen [his daughter is a Paediatric nurse in [place]. He still complains about the fact that and is 75 year old home when the air conditioner comes on and they air blows out of the duct system he does sneeze 10 times and his feet does feel cold. She plans to put hepa filters that are high and and also get the duct cleaning. He says he did have allergy evaluation and found few  years ago and it was negative. Recently he has increased his rpinrole and alsogot an injection for his back and he does feel better   OV 06/22/2017  Chief Complaint  Patient presents with  . Follow-up    Pt still taking Esbriet and has been doing good except has been having issues with nausea x3 days. PFT done today. Still becomes SOB with exertion. Denies any complaints of cough or CP.   IPF followup  Routine followup. Now on esbriet 3 pills tid since end oct 2018. Having new nausea - moderate, x 3 days. Might be chills. But no associated diarrhea or other side effects. Spacing esbriet 4h only. Also has complaitns about high co pay. Unable to find charity. Doing exrcise with trainer - feeels that is better and more vigorous than rehab. Gets tachy with eercise but says he does not desaturate. Had PFT - stable. Lot of questions about disease   Results for Kenneth, [name] (MRN [id number]) as of 06/22/2017 09:40  Ref. Range 03/04/2017 09:13 06/22/2017 09:02  FVC-Pre Latest Units: L 3.59 3.70  FVC-%Pred-Pre Latest Units: % 86 89  Results for Kenneth, [name] (MRN [id number]) as of 06/22/2017 09:40  Ref. Range 03/04/2017 09:13 06/22/2017 09:02  DLCO cor Latest Units: ml/min/mmHg 20.06 19.24  DLCO cor % pred Latest Units: % 63 60   Walking desaturation test on 06/22/2017 185 feet x 3 laps on ROOM AIR:  did NOT desaturate. Rest pulse ox was 100%, final pulse ox was 99%. HR response 84/min at rest to 97/min at peak exertion. Patient Kenneth [name]  Did not Desaturate < 88% . Kenneth [name] did not  Desaturated </= 3% points. Kenneth [name] yes did get tachyardic    has a past medical history of Arthritis, Diverticulosis of colon (without mention of hemorrhage), GERD (gastroesophageal reflux disease), Hiatal hernia, HLD (hyperlipidemia), Internal hemorrhoids without mention of complication, Irritable bowel syndrome, Other specified gastritis without mention of hemorrhage, and Pulmonary fibrosis ([place]).   reports that  has never smoked. he has never used smokeless tobacco.  Past Surgical History:  Procedure Laterality Date  . CARPAL TUNNEL RELEASE     left  . CATARACT EXTRACTION  06/2011   bilateral  . [place]   x 2  . LUMBAR LAMINECTOMY/DECOMPRESSION MICRODISCECTOMY Left 07/11/2013   Procedure: LUMBAR ONE TO TWO, LUMBAR TWO TO THREE, LUMBAR THREE TO FOUR LUMBAR LAMINECTOMY/DECOMPRESSION MICRODISCECTOMY 3 LEVELS;  Surgeon: Charlie Pitter, MD;  Location: [facility];  Service: Neurosurgery;  Laterality: Left;    No Known Allergies  Immunization History  Administered Date(s) Administered  . Influenza, High Dose Seasonal PF 03/06/2017  . Influenza,inj,Quad PF,6+ Mos 04/05/2016  . Pneumococcal-Unspecified 04/05/2016  . Zoster Recombinat (Shingrix) 01/03/2017    Family History  Problem Relation Age of Onset  . Heart failure Father 81  . Colon  cancer Neg Hx   . Pancreatic cancer Neg Hx   . Rectal cancer Neg Hx   . Stomach cancer Neg Hx      Current Outpatient Medications:  .  acetaminophen-codeine (TYLENOL #3) 300-30 MG per tablet, Take 1 tablet by mouth every 8 (eight) hours as needed for moderate pain. , Disp: , Rfl:  .  carisoprodol (SOMA) 350 MG tablet, Take 175 mg by mouth 3 (three) times daily as needed for muscle spasms. , Disp: , Rfl:  .  Cholecalciferol (VITAMIN D-3) 1000 UNITS CAPS, Take 1,000 Units by mouth daily., Disp: ,  Rfl:  .  CO-ENZYME Q-10 PO, Take 1 tablet by mouth daily., Disp: , Rfl:  .  dicyclomine (BENTYL) 10 MG capsule, Take one by mouth every 6 hours as needed for abdominal cramping, Disp: 30 capsule, Rfl: 1 .  diphenoxylate-atropine (LOMOTIL) 2.5-0.025 MG tablet, Take 1 tablet by mouth daily as needed., Disp: , Rfl:  .  ESBRIET 267 MG TABS, , Disp: , Rfl:  .  esomeprazole (NEXIUM) 40 MG capsule, Take 40 mg by mouth every evening. , Disp: , Rfl:  .  fexofenadine (ALLEGRA) 30 MG tablet, Take 30 mg by mouth daily as needed (allergies). , Disp: , Rfl:  .  fluticasone (FLONASE) 50 MCG/ACT nasal spray, Place 2 sprays into the nose daily as needed for allergies. , Disp: , Rfl:  .  Glucosamine-Chondroitin (GLUCOSAMINE CHONDR COMPLEX PO), Take 1 tablet by mouth daily., Disp: , Rfl:  .  HYDROcodone-acetaminophen (NORCO/VICODIN) 5-325 MG per tablet, Take 1-2 tablets by mouth every 6 (six) hours as needed for moderate pain., Disp: , Rfl:  .  meloxicam (MOBIC) 15 MG tablet, TAKE 1 TABLET BY MOUTH EVERY DAY WITH A MEAL, Disp: , Rfl: 4 .  methocarbamol (ROBAXIN) 500 MG tablet, Take 1 tablet (500 mg total) by mouth every 8 (eight) hours as needed for muscle spasms., Disp: 40 tablet, Rfl: 0 .  rOPINIRole (REQUIP) 0.5 MG tablet, Take 0.5 mg by mouth at bedtime., Disp: , Rfl:  .  triamcinolone cream (KENALOG) 0.1 %, APPLY TO AFFECTED AREA, Disp: , Rfl: 12   Review of Systems     Objective:   Physical Exam   Constitutional: He is oriented to person, place, and time. He appears well-developed and well-nourished. No distress.  HENT:  Head: Normocephalic and atraumatic.  Right Ear: External ear normal.  Left Ear: External ear normal.  Mouth/Throat: Oropharynx is clear and moist. No oropharyngeal exudate.  Eyes: Conjunctivae and EOM are normal. Pupils are equal, round, and reactive to light. Right eye exhibits no discharge. Left eye exhibits no discharge. No scleral icterus.  Neck: Normal range of motion. Neck supple. No JVD present. No tracheal deviation present. No thyromegaly present.  Cardiovascular: Normal rate, regular rhythm and intact distal pulses. Exam reveals no gallop and no friction rub.  No murmur heard. Pulmonary/Chest: Effort normal. No respiratory distress. He has no wheezes. He has rales. He exhibits no tenderness.  Basal crackles  Abdominal: Soft. Bowel sounds are normal. He exhibits no distension and no mass. There is no tenderness. There is no rebound and no guarding.  Musculoskeletal: Normal range of motion. He exhibits no edema or tenderness.  Lymphadenopathy:    He has no cervical adenopathy.  Neurological: He is alert and oriented to person, place, and time. He has normal reflexes. No cranial nerve deficit. Coordination normal.  Skin: Skin is warm and dry. No rash noted. He is not diaphoretic. No erythema. No pallor.  Psychiatric: He has a normal mood and affect. His behavior is normal. Judgment and thought content normal.  Nursing note and vitals reviewed.   Vitals:   06/22/17 0936  BP: (!) 144/78  Pulse: 87  SpO2: 97%  Weight: 184 lb (83.5 kg)  Height: 5' 9.5" (1.765 m)    Estimated body mass index is 26.78 kg/m as calculated from the following:   Height as of this encounter: 5' 9.5" (1.765 m).   Weight as of this encounter: 184 lb (83.5 kg).      Assessment:       ICD-10-CM  1. 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[place]) J84.112 Pulmonary function test   2. Nausea R11.0   3. Encounter for therapeutic drug monitoring Z51.81        Plan:      #IPF - stable disease -continue esbriet 3 pills three times a day - repeat Pre-bd spiro and dlco only. No lung volume or bd response. X in 3 months - CT scan not needed on schedule for monitoring but given 38m subpleural node in august 2018 scan we can look at a repeat CT in fall 2019 but we can decide this later - continue daily exercises - you should monitor pulse ox (there is app based device as well)   #nausea and encounter for drug  monitoring  - likely due to esbriit - ensure 5h between dosages  - try ginger capsules or raw ginger as needed - monitor for foods that cause nausea  - hydrate yourself - check LFT 06/22/2017  - will see what we can do to help with copay  #Followup - every month do LFT at our lab and we will call with result - return in 3 months but after spirometry    > 50% of this > 25 min visit spent in face to face counseling or coordination of care   Dr. MBrand Males M.D., [facility].P Pulmonary and Critical Care Medicine Staff Physician, CPhiloDirector - Interstitial Lung Disease  Program  Pulmonary [place] [id number] Pager: [phone] If no answer or between  15:00h - 7:00h: call [phone] Telephone: [phone]

## 2017-06-22 NOTE — Progress Notes (Signed)
Discharge Progress Report  Patient Details  Name: Kenneth [name] MRN: [id number]  Date of Birth: Nov 12, 1940 Referring Provider:     Pulmonary Rehab Walk Test from 05/25/2017 in [place]  Referring Provider  Dr. [name]       Number of Visits: 1  Reason for Discharge:  Early Exit:  Times are not convenient for patient.  Smoking History:  Social History   Tobacco Use  Smoking Status Never Smoker  Smokeless Tobacco Never Used  Tobacco Comment   social    Diagnosis:  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[place])  ADL UCSD: Pulmonary Assessment Scores    Row Name 05/31/17 1411 06/02/17 1445       ADL UCSD   ADL Phase  Entry  Entry    SOB Score total  -  2      CAT Score   CAT Score  -  1 Entry      mMRC Score   mMRC Score  1  -       Initial Exercise Prescription: Initial Exercise Prescription - 05/31/17 1400      Date of Initial Exercise RX and Referring Provider   Date  05/25/17    Referring Provider  Dr. [name]      Treadmill   MPH  1.7    Grade  0    Minutes  17      Bike   Level  0.6    Minutes  17      NuStep   Level  3    SPM  80    Minutes  17    METs  2      Prescription Details   Frequency (times per week)  2    Duration  Progress to 45 minutes of aerobic exercise without signs/symptoms of physical distress      Intensity   THRR 40-80% of Max Heartrate  58-116    Ratings of Perceived Exertion  11-13    Perceived Dyspnea  0-4      Progression   Progression  Continue progressive overload as per policy without signs/symptoms or physical distress.      Resistance Training   Training Prescription  Yes    Weight  blue bands    Reps  10-15       Discharge Exercise Prescription (Final Exercise Prescription Changes): Exercise Prescription Changes - 06/01/17 1200      Response to Exercise   Blood Pressure (Admit)  124/76    Blood Pressure (Exercise)  138/80    Blood Pressure (Exit)  150/60    Heart Rate (Admit)  89 bpm    Heart Rate (Exercise)  122 bpm    Heart Rate (Exit)  120 bpm    Oxygen Saturation (Admit)  97 %    Oxygen Saturation (Exercise)  92 %    Oxygen Saturation (Exit)  96 %    Rating of Perceived Exertion (Exercise)  12    Perceived Dyspnea (Exercise)  2    Duration  Progress to 45 minutes of aerobic exercise without signs/symptoms of physical distress    Intensity  THRR unchanged      Progression   Progression  Continue to progress workloads to maintain intensity without signs/symptoms of physical distress.      Resistance Training   Training Prescription  Yes    Weight  blue bands    Reps  10-15    Time  10 Minutes  Treadmill   MPH  2.8    Grade  3    Minutes  17      Bike   Level  1.6    Minutes  17      NuStep   Level  3    SPM  80    Minutes  17    METs  2.7       Functional Capacity: 6 Minute Walk    Row Name 05/31/17 1406         6 Minute Walk   Phase  Initial     Distance  1770 feet     Walk Time  6 minutes     # of Rest Breaks  0     MPH  3.35     METS  3.53     RPE  12     Perceived Dyspnea   0     Symptoms  Yes (comment)     Comments  2/10 pain in back     Resting HR  102 bpm     Resting BP  110/60     Resting Oxygen Saturation   92 %     Exercise Oxygen Saturation  during 6 min walk  92 %     Max Ex. HR  120 bpm     Max Ex. BP  144/64       Interval HR   1 Minute HR  102     2 Minute HR  116     3 Minute HR  117     4 Minute HR  115     5 Minute HR  120     6 Minute HR  116     2 Minute Post HR  110     Interval Heart Rate?  Yes       Interval Oxygen   Interval Oxygen?  Yes     Baseline Oxygen Saturation %  92 %     1 Minute Oxygen Saturation %  93 %     1 Minute Liters of Oxygen  0 L     2 Minute Oxygen Saturation %  92 %     2 Minute Liters of Oxygen  0 L     3 Minute Oxygen Saturation %  93 %     3 Minute Liters of Oxygen  0 L     4 Minute Oxygen Saturation %  93 %     4 Minute Liters of  Oxygen  0 L     5 Minute Oxygen Saturation %  92 %     5 Minute Liters of Oxygen  0 L     6 Minute Oxygen Saturation %  93 %     6 Minute Liters of Oxygen  0 L     2 Minute Post Oxygen Saturation %  95 %     2 Minute Post Liters of Oxygen  0 L        Psychological, QOL, Others - Outcomes: PHQ 2/9: Depression screen PHQ 2/9 05/21/2017  Decreased Interest 0  Down, Depressed, Hopeless 0  PHQ - 2 Score 0    Quality of Life:   Personal Goals: Goals established at orientation with interventions provided to work toward goal. Personal Goals and Risk Factors at Admission - 05/21/17 1126      Core Components/Risk Factors/Patient Goals on Admission   Improve shortness of breath with ADL's  Yes    Intervention  Provide education, individualized exercise plan and daily activity instruction to help decrease symptoms of SOB with activities of daily living.    Expected Outcomes  Short Term: Achieves a reduction of symptoms when performing activities of daily living.    Develop more efficient breathing techniques such as purse lipped breathing and diaphragmatic breathing; and practicing self-pacing with activity  Yes    Intervention  Provide education, demonstration and support about specific breathing techniuqes utilized for more efficient breathing. Include techniques such as pursed lipped breathing, diaphragmatic breathing and self-pacing activity.    Expected Outcomes  Short Term: Participant will be able to demonstrate and use breathing techniques as needed throughout daily activities.    Increase knowledge of respiratory medications and ability to use respiratory devices properly   Yes    Intervention  Provide education and demonstration as needed of appropriate use of medications, inhalers, and oxygen therapy.    Expected Outcomes  Short Term: Achieves understanding of medications use. Understands that oxygen is a medication prescribed by physician. Demonstrates appropriate use of inhaler and  oxygen therapy.        Personal Goals Discharge:   Exercise Goals and Review: Exercise Goals    Row Name 05/21/17 1100             Exercise Goals   Increase Physical Activity  Yes       Intervention  Provide advice, education, support and counseling about physical activity/exercise needs.;Develop an individualized exercise prescription for aerobic and resistive training based on initial evaluation findings, risk stratification, comorbidities and participant's personal goals.       Expected Outcomes  Achievement of increased cardiorespiratory fitness and enhanced flexibility, muscular endurance and strength shown through measurements of functional capacity and personal statement of participant.       Increase Strength and Stamina  Yes       Intervention  Provide advice, education, support and counseling about physical activity/exercise needs.;Develop an individualized exercise prescription for aerobic and resistive training based on initial evaluation findings, risk stratification, comorbidities and participant's personal goals.       Expected Outcomes  Achievement of increased cardiorespiratory fitness and enhanced flexibility, muscular endurance and strength shown through measurements of functional capacity and personal statement of participant.       Able to understand and use rate of perceived exertion (RPE) scale  Yes       Intervention  Provide education and explanation on how to use RPE scale       Expected Outcomes  Short Term: Able to use RPE daily in rehab to express subjective intensity level;Long Term:  Able to use RPE to guide intensity level when exercising independently       Able to understand and use Dyspnea scale  Yes       Intervention  Provide education and explanation on how to use Dyspnea scale       Expected Outcomes  Short Term: Able to use Dyspnea scale daily in rehab to express subjective sense of shortness of breath during exertion;Long Term: Able to use Dyspnea  scale to guide intensity level when exercising independently       Knowledge and understanding of Target Heart Rate Range (THRR)  Yes       Intervention  Provide education and explanation of THRR including how the numbers were predicted and where they are located for reference       Expected Outcomes  Short Term: Able to state/look up THRR;Short Term: Able to use daily as  guideline for intensity in rehab;Long Term: Able to use THRR to govern intensity when exercising independently       Understanding of Exercise Prescription  Yes       Intervention  Provide education, explanation, and written materials on patient's individual exercise prescription       Expected Outcomes  Short Term: Able to explain program exercise prescription;Long Term: Able to explain home exercise prescription to exercise independently          Nutrition & Weight - Outcomes: Pre Biometrics - 05/21/17 1101      Pre Biometrics   Grip Strength  33 kg        Nutrition:   Nutrition Discharge: Nutrition Assessments - 06/04/17 1406      Rate Your Plate Scores   Pre Score  52       Education Questionnaire Score: Knowledge Questionnaire Score - 06/02/17 1445      Knowledge Questionnaire Score   Pre Score  10/13

## 2017-06-22 NOTE — Telephone Encounter (Signed)
Pt is in clinic currently for an office visit.  Raquel Sarna is relaying these recs to pt during [place] today.  Will close encounter.

## 2017-06-22 NOTE — Telephone Encounter (Signed)
Notes recorded by Riley Kill, CMA on 06/22/2017 at 2:37 PM EST Called patient, unable to reach left message to give [place] a call back. ------  Notes recorded by Brand Males, MD on 06/22/2017 at 1:01 PM EST Lab       06/22/17           1022     AST     20      ALT     18      ALKPHOS   50      BILITOT   0.5      PROT     7.2      ALBUMIN   4.5       Normal lft    Pt aware of results and no further questions or concerns at this time.

## 2017-06-24 ENCOUNTER — Encounter (HOSPITAL_COMMUNITY): Payer: PPO

## 2017-07-01 ENCOUNTER — Encounter (HOSPITAL_COMMUNITY): Payer: PPO

## 2017-07-08 ENCOUNTER — Encounter (HOSPITAL_COMMUNITY): Payer: PPO

## 2017-07-12 ENCOUNTER — Telehealth: Payer: Self-pay | Admitting: Internal Medicine

## 2017-07-12 NOTE — Telephone Encounter (Signed)
Per Colletta [place] at [place], [place] needs to have a prior auth for his medication. Cb is [phone] opt. 2 Prior auth [phone]  Fax Number-[phone]

## 2017-07-12 NOTE — Telephone Encounter (Signed)
Called pt who stated he called [facility] stating he was needing a renewal of his Esbriet and they told him a PA needed to be done.  Called and spoke with Santiago Glad who told me that we would need to call his insurance company to initiate the PA.  Called [facility] and spoke with Elberta Fortis letting him know I was wanting to do a PA for his Esbriet. Elberta Fortis transferred me to his clinical coordinator, Rose.  Rose performed an urgent PA and med was approved through 07/12/2018.  PA #[id number].  Called [facility] and spoke with [place] letting her know this information and gave her the PA #.  Izola Price stated she saw the approval and would reach out to pt regarding this information. Nothing further needed.

## 2017-07-13 ENCOUNTER — Encounter (HOSPITAL_COMMUNITY): Payer: PPO

## 2017-07-15 ENCOUNTER — Encounter (HOSPITAL_COMMUNITY): Payer: PPO

## 2017-07-16 ENCOUNTER — Other Ambulatory Visit: Payer: Self-pay | Admitting: Neurosurgery

## 2017-07-16 ENCOUNTER — Ambulatory Visit
Admission: RE | Admit: 2017-07-16 | Discharge: 2017-07-16 | Disposition: A | Discharge status: Home / Self Care | Payer: PPO | Source: Ambulatory Visit | Attending: Neurosurgery | Admitting: Neurosurgery

## 2017-07-16 DIAGNOSIS — M47817 Spondylosis without myelopathy or radiculopathy, lumbosacral region: Secondary | ICD-10-CM | POA: Diagnosis not present

## 2017-07-16 DIAGNOSIS — G8929 Other chronic pain: Secondary | ICD-10-CM

## 2017-07-16 DIAGNOSIS — M545 Low back pain: Principal | ICD-10-CM

## 2017-07-16 MED ORDER — IOPAMIDOL (ISOVUE-M 200) INJECTION 41%
1.0000 mL | Freq: Once | INTRAMUSCULAR | Status: AC
  Administered 2017-07-16: 1 mL via EPIDURAL

## 2017-07-16 MED ORDER — METHYLPREDNISOLONE ACETATE 40 MG/ML INJ SUSP (RADIOLOG
120.0000 mg | Freq: Once | INTRAMUSCULAR | Status: AC
  Administered 2017-07-16: 120 mg via EPIDURAL

## 2017-07-20 ENCOUNTER — Encounter (HOSPITAL_COMMUNITY): Payer: PPO

## 2017-07-21 ENCOUNTER — Other Ambulatory Visit: Payer: Self-pay | Admitting: Neurosurgery

## 2017-07-21 DIAGNOSIS — M48061 Spinal stenosis, lumbar region without neurogenic claudication: Secondary | ICD-10-CM

## 2017-07-21 DIAGNOSIS — M4726 Other spondylosis with radiculopathy, lumbar region: Secondary | ICD-10-CM | POA: Diagnosis not present

## 2017-07-22 ENCOUNTER — Encounter (HOSPITAL_COMMUNITY): Payer: PPO

## 2017-07-27 ENCOUNTER — Encounter (HOSPITAL_COMMUNITY): Payer: PPO

## 2017-07-29 ENCOUNTER — Encounter (HOSPITAL_COMMUNITY): Payer: PPO

## 2017-07-30 DIAGNOSIS — M4726 Other spondylosis with radiculopathy, lumbar region: Secondary | ICD-10-CM | POA: Diagnosis not present

## 2017-08-03 ENCOUNTER — Inpatient Hospital Stay
Admission: RE | Admit: 2017-08-03 | Discharge: 2017-08-03 | Disposition: A | Discharge status: Home / Self Care | Payer: PPO | Source: Ambulatory Visit | Attending: Neurosurgery | Admitting: Neurosurgery

## 2017-08-03 ENCOUNTER — Encounter (HOSPITAL_COMMUNITY): Payer: PPO

## 2017-08-05 ENCOUNTER — Encounter (HOSPITAL_COMMUNITY): Payer: PPO

## 2017-08-10 ENCOUNTER — Encounter (HOSPITAL_COMMUNITY): Payer: PPO

## 2017-08-12 ENCOUNTER — Encounter (HOSPITAL_COMMUNITY): Payer: PPO

## 2017-08-17 ENCOUNTER — Encounter (HOSPITAL_COMMUNITY): Payer: PPO

## 2017-08-19 ENCOUNTER — Encounter (HOSPITAL_COMMUNITY): Payer: PPO

## 2017-08-24 ENCOUNTER — Encounter (HOSPITAL_COMMUNITY): Payer: PPO

## 2017-08-26 ENCOUNTER — Encounter (HOSPITAL_COMMUNITY): Payer: PPO

## 2017-08-31 ENCOUNTER — Encounter (HOSPITAL_COMMUNITY): Payer: PPO

## 2017-09-02 ENCOUNTER — Encounter (HOSPITAL_COMMUNITY): Payer: PPO

## 2017-09-07 ENCOUNTER — Encounter (HOSPITAL_COMMUNITY): Payer: PPO

## 2017-09-09 ENCOUNTER — Encounter (HOSPITAL_COMMUNITY): Payer: PPO

## 2017-09-14 ENCOUNTER — Encounter (HOSPITAL_COMMUNITY): Payer: PPO

## 2017-09-16 ENCOUNTER — Encounter (HOSPITAL_COMMUNITY): Payer: PPO

## 2017-09-20 ENCOUNTER — Encounter: Payer: Self-pay | Admitting: Internal Medicine

## 2017-09-20 ENCOUNTER — Other Ambulatory Visit (INDEPENDENT_AMBULATORY_CARE_PROVIDER_SITE_OTHER): Payer: PPO

## 2017-09-20 ENCOUNTER — Ambulatory Visit: Payer: PPO | Admitting: Internal Medicine

## 2017-09-20 ENCOUNTER — Ambulatory Visit (INDEPENDENT_AMBULATORY_CARE_PROVIDER_SITE_OTHER): Payer: PPO | Admitting: Internal Medicine

## 2017-09-20 ENCOUNTER — Telehealth: Payer: Self-pay | Admitting: Internal Medicine

## 2017-09-20 VITALS — BP 116/64 | HR 94 | Ht 69.5 in | Wt 180.0 lb

## 2017-09-20 DIAGNOSIS — Z5181 Encounter for therapeutic drug level monitoring: Secondary | ICD-10-CM | POA: Diagnosis not present

## 2017-09-20 DIAGNOSIS — J84112 Idiopathic pulmonary fibrosis: Secondary | ICD-10-CM

## 2017-09-20 LAB — PULMONARY FUNCTION TEST
DL/VA % pred: 85 %
DL/VA: 3.89 ml/min/mmHg/L
DLCO unc % pred: 76 %
DLCO unc: 24.06 ml/min/mmHg
FEF 25-75 Pre: 3.64 L/sec
FEF2575-%Pred-Pre: 170 %
FEV1-%Pred-Pre: 98 %
FEV1-Pre: 2.93 L
FEV1FVC-%Pred-Pre: 117 %
FEV6-%Pred-Pre: 90 %
FEV6-Pre: 3.47 L
FEV6FVC-%Pred-Pre: 106 %
FVC-%Pred-Pre: 84 %
FVC-Pre: 3.47 L
Pre FEV1/FVC ratio: 84 %
Pre FEV6/FVC Ratio: 100 %

## 2017-09-20 LAB — HEPATIC FUNCTION PANEL
ALT: 17 U/L (ref 0–53)
AST: 18 U/L (ref 0–37)
Albumin: 4.6 g/dL (ref 3.5–5.2)
Alkaline Phosphatase: 61 U/L (ref 39–117)
Bilirubin, Direct: 0.1 mg/dL (ref 0.0–0.3)
Total Bilirubin: 0.5 mg/dL (ref 0.2–1.2)
Total Protein: 7.5 g/dL (ref 6.0–8.3)

## 2017-09-20 MED ORDER — PIRFENIDONE 801 MG PO TABS: 1.0000 | ORAL_TABLET | Freq: Three times a day (TID) | ORAL | 12 refills | Status: DC

## 2017-09-20 NOTE — Telephone Encounter (Signed)
Pt is currently taking 223m esbriet 3 pills tid.  Per MR, change pt's dose to 8069mso he can take 1tab tid due to pt tolerating the three pills tid.  CaSnellingnd spoke with PaNevin Bloodgoodetting her know we were changing the dose of Esbriet to the 801 mg from the 26781m Stated to PauCaroleen was aware of the dose change.  PauNevin Bloodgoodated to me if a prior autJosem Kaufmanns needed they would reach out to us [place] this.  Nothing further needed at this current time.

## 2017-09-20 NOTE — Progress Notes (Signed)
Patient completed Spiro & DLCO today. 

## 2017-09-20 NOTE — Progress Notes (Signed)
Subjective:     Patient ID: Kenneth [name], male   DOB: 1940-08-29, 77 y.o.   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 He tells me that this summer 2018*noticing insidious onset of shortness of breath particularly with walking stairs in the house or [place]. This was not that in the previous years. The summer 2018 he was in [place] but did not notice much shortness of breath. Otherwise he is able to do his activities of daily living and played golf with a car. He is more bothered by his back pain and sciatica as a result of previous back surgery. Shortness of breath is extremely mild. He says he became more aware of it after the radiologic investigations documented below. He had a chest x-ray that suggested interstitial findings and therefore he underwent a high-resolution CT scan of the chest that is described below. I personally visualized this high resolution CT chest and to me it shows bilateral bibasal subpleural reticulation that also extends to the upper lobes without any zonal predominance. There is no obvious honeycombing but there is traction bronchiectasis. There no mediastinal adenopathy. Therefore he is been referred here. In terms of exposure history other than radiation exposure he has not been on any pulmonary toxic drugs or any mold exposure or asbestos exposure. Does have GERD and is on PPI and is well ocntrolled   Does have spring and perennial allergies - sneezes easy. Allergy test with [facility] negative some years ago  [employer] chest physicians interstitial lung disease questionnaire: He says that he does not cough. He is only trouble with dyspnea with strenuous exercise and it started 4 months ago. Past medical history significant for acid reflux. He has  never smoked any tobacco or street drugs. Does not have any family history of lung disease. At home he does not have any humidifier sound birds heart the water damage or mold. 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 He has standard radiation exposure while at work. The standard radiation for a radiologist. He does not take any pulmonary toxic drugs. House is 76 years old and he worries about mold. He sneezes if air is forced   Pulmonary function test shows isolated reduction in diffusion capacity to 62%. Walking desaturation test underneath her feet 3 laps on room air: Resting pulse ox 96%. Final pulse ox 95%. Heart rate resting was 92/m and went up to 109 minute.   Results for YUCHEN, FEDOR (MRN [id number]) as of 03/23/2017 10:04  Ref. Range 03/04/2017 09:13  FVC-Pre Latest Units: L 3.59  FVC-%Pred-Pre Latest Units: % 86  Results for RENEE, BEALE (MRN [id number]) as of 03/23/2017 10:04  Ref. Range 03/04/2017 09:13  DLCO cor Latest Units: ml/min/mmHg 20.06  DLCO cor % pred Latest Units: % 63    Lungs/Pleura: There is subpleural reticulation with scattered ground-glass and traction bronchiolectasis, without a definite zonal predominance. No definitive honeycombing. Findings appear mildly progressive when radiographs dating back to 12/28/2008 are reviewed. Probable 5 mm subpleural lymph node along the right major fissure. No pleural fluid. Airway is unremarkable.      IMPRESSION: 1. Pulmonary parenchymal findings of interstitial lung disease which may be due to fibrotic nonspecific interstitial pneumonitis  or, given slight progression over time, usual interstitial pneumonitis. 2. Aortic atherosclerosis (ICD10-170.0). Coronary artery calcification. 3. Aortic aneurysm NOS (ICD10-I71.9). 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 Recommend annual imaging followup by CTA or MRA. This recommendation follows  2010 ACCF/AHA/AATS/ACR/ASA/SCA/SCAI/SIR/STS/SVM Guidelines for the Diagnosis and Management of Patients with Thoracic Aortic Disease. Circulation. 2010; 121: Z610-R604. 4. Prominence of the right and left main pulmonary arteries can be seen with pulmonary arterial hypertension.   Electronically Signed   By: [name]   On: 02/17/2017 13:41     OV 03/23/2017  Chief Complaint  Patient presents with  . Follow-up    SOB on exertion. Other than that he has been doing about the same. Denies any cough or CP.   Here to discuss results. [name]. 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 In inteirm no new symptoms.  Autoimmune profile negative. They raise possibility of 2nd opinion.  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 Progression is based on CXR comparison since 2010; there is no CT and is only suspicion of progression  Results for PHILIPP, CALLEGARI (MRN [id number]) as of 03/23/2017 10:04  Ref. Range 12/08/2011 16:44 03/04/2017 13:47  Anit Nuclear Antibody(ANA) Latest Ref Range: NEGATIVE   NEG  Angiotensin-Converting Enzyme Latest Ref Range: 9 - 67 U/L  31  Cyclic Citrullin Peptide Ab Latest Units: Units  <16  ds DNA Ab Latest Units: IU/mL  <1  ENA RNP Ab Latest Ref Range: 0.0 - 0.9 AI  0.2  Endomysial Screen Latest Ref Range: NEGATIVE  NEGATIVE   Deamidated Gliadin Abs, IgG Latest Ref Range: <20 U/mL 4.1   Gliadin IgA Latest Ref Range: <20 U/mL 1.8   RA Latex Turbid. Latest Ref Range: <14 IU/mL  <14  Tissue Transglut Ab Latest Ref Range: <20 U/mL 3.9   Tissue Transglutaminase Ab, IgA Latest Ref Range: <20 U/mL 2.2   IgE (Immunoglobulin E), Serum Latest Ref Range: <115 kU/L  31  IgA Latest Ref Range: 68 - 379 mg/dL 132   SSA (Ro) (ENA) Antibody, IgG Latest Ref Range: <1.0 NEG AI   <1.0 NEG  SSB (La) (ENA) Antibody, IgG Latest Ref Range: <1.0 NEG AI   <1.0 NEG    OV  05/06/2017  Chief Complaint  Patient presents with  . Follow-up    Follow up today after beginning [place]. Pt has no c/o SOB, cough, or CP. Has had some mild nausea from the [place] but other than that, he has been doing good on it.   ollow-up idiopathic pulmonary fibrosis mild severity  He is here to follow-up He is on the third week of his bed at 3 pills 3 times a day max dose. So far is tolerating it fine other than mild nausea. He does take after meals. He plans to meet with [facility] coordinator for medication support. He did visit [place] this past weekend and did have vomiting especially after consuming few etoh drinks at a [employer]. He does apply sunscreen [his daughter is a Paediatric nurse in [place]. He still complains about the fact that and is 77 year old home when the air conditioner comes on and they air blows out of the duct system he does sneeze 10 times and his feet does feel cold. She plans to put hepa filters that are high and and also get the duct cleaning. He says he did have allergy evaluation and  found few years ago and it was negative. Recently he has increased his rpinrole and alsogot an injection for his back and he does feel better   OV 06/22/2017  Chief Complaint  Patient presents with  . Follow-up    Pt still taking Esbriet and has been doing good except has been having issues with nausea x3 days. PFT done today. Still becomes SOB with exertion. Denies any complaints of cough or CP.   IPF followup  Routine followup. Now on esbriet 3 pills tid since end oct 2018. Having new nausea - moderate, x 3 days. Might be chills. But no associated diarrhea or other side effects. Spacing esbriet 4h only. Also has complaitns about high co pay. Unable to find charity. Doing exrcise with trainer - feeels that is better and more vigorous than rehab. Gets tachy with eercise but says he does not desaturate. Had PFT - stable. Lot of questions about disease    Walking  desaturation test on 06/22/2017 185 feet x 3 laps on ROOM AIR:  did NOT desaturate. Rest pulse ox was 100%, final pulse ox was 99%. HR response 84/min at rest to 97/min at peak exertion. Patient BRENNER VISCONTI  Did not Desaturate < 88% . Kenneth [name] did not  Desaturated </= 3% points. Mathieu A [place] yes did get tachyardic   OV 09/20/2017  Chief Complaint  Patient presents with  . Follow-up    PFT done today.  Pt states he has been doing good. Pt still taking Esbriet and states he is doing good on it.   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  Joen Laura for IPF follow-up.  Last visit December 2018.  Since then he has been compliant fully with his Pirfenidone (Esbriet).  His nausea resolved with Ginger intake.  However he is having 3-5 pound weight loss and also fatigue towards the end of the day and also low appetite.  He is exercising vigorously 5 times a week and he is wondering if the fatigue could be because of the heavy exercise.  He is not interested in lung transplantation.  He is open to research participation in interventional trial.  Currently he is on the IPF registry program.  Overall he is well.  He did go to [place] to visit his son and he did not wear sunscreen and he did pick up a stage I skin burn with erythema that was more than usual.  But this resolved.  I did remind him about his obligation to wear sunscreen at all times with Pirfenidone (Esbriet).  He is interested in interventional research trials.  His pulmonary function test to me on an average is stable even though there is decline in FVC that seems to be an increase in DLCO so that is variability.  His walking desaturation test is around the same. He is interested in rolling over to the larger capsule of the Pirfenidone (Esbriet) so we will have to take it only 3 times a day. ESberit is cposting him $500/month or more due to high AGI and this is frustrating for him   Walking desaturation test on 09/20/2017 185 feet x 3 laps on ROOM AIR:  did  walk briskly all 3 laps with only mild dyspnea desaturate. Rest pulse ox was 98%, final pulse ox was 98%. HR response 96/min at rest to 107/min at peak exertion. Patient BERTHA EARWOOD  Did not Desaturate < 88% . Kenneth [name] did not  Desaturated </= 3% points. Marylyn Ishihara A Nicolaisen yes did get  tachyardic this appears similar to a few months ago  Results for JEARL, SOTO (MRN [id number]) as of 09/20/2017 09:41  Ref. Range 03/04/2017 09:13 06/22/2017 09:02 09/20/2017 08:54  FVC-Pre Latest Units: L 3.59 3.70 3.47  FVC-%Pred-Pre Latest Units: % 86 89 84  Results for MACLEAN, FOISTER (MRN [id number]) as of 09/20/2017 09:41  Ref. Range 03/04/2017 09:13 06/22/2017 09:02 09/20/2017 08:54  DLCO unc Latest Units: ml/min/mmHg 20.12 19.77 24.06  DLCO unc % pred Latest Units: % 63 62 76    Review of Systems     Objective:   Physical Exam  Constitutional: He is oriented to person, place, and time. He appears well-developed and well-nourished. No distress.  Looks well  HENT:  Head: Normocephalic and atraumatic.  Right Ear: External ear normal.  Left Ear: External ear normal.  Mouth/Throat: Oropharynx is clear and moist. No oropharyngeal exudate.  Eyes: Conjunctivae and EOM are normal. Pupils are equal, round, and reactive to light. Right eye exhibits no discharge. Left eye exhibits no discharge. No scleral icterus.  Neck: Normal range of motion. Neck supple. No JVD present. No tracheal deviation present. No thyromegaly present.  Cardiovascular: Normal rate, regular rhythm and intact distal pulses. Exam reveals no gallop and no friction rub.  No murmur heard. Pulmonary/Chest: Effort normal and breath sounds normal. No respiratory distress. He has no wheezes. He has no rales. He exhibits no tenderness.  Abdominal: Soft. Bowel sounds are normal. He exhibits no distension and no mass. There is no tenderness. There is no rebound and no guarding.  Musculoskeletal: Normal range of motion. He exhibits no edema or tenderness.   Lymphadenopathy:    He has no cervical adenopathy.  Neurological: He is alert and oriented to person, place, and time. He has normal reflexes. No cranial nerve deficit. Coordination normal.  Skin: Skin is warm and dry. No rash noted. He is not diaphoretic. No erythema. No pallor.  Psychiatric: He has a normal mood and affect. His behavior is normal. Judgment and thought content normal.  Nursing note and vitals reviewed.  Vitals:   09/20/17 0928  BP: 116/64  Pulse: 94  SpO2: 97%  Weight: 180 lb (81.6 kg)  Height: 5' 9.5" (1.765 m)    Estimated body mass index is 26.2 kg/m as calculated from the following:   Height as of this encounter: 5' 9.5" (1.765 m).   Weight as of this encounter: 180 lb (81.6 kg).      Assessment:       ICD-10-CM   1. 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[place]) J84.112   2. Encounter for therapeutic drug monitoring Z51.81        Plan:      Stable clinically Fatigue might be exercise related or esbriet Low appetite and weight loss likely related to esbreit Glad nausea from esbriet resolved with ginger and other measures Sun sensitivity likely related to esbriet Too bad esbriet expensive for you  Plan  - continue esbriet ; seems despite side effect is mild and you are tolerating it   - ensure you apply sun screen all the time  - check LFT 09/20/2017   - can roll over to the 865m+ pill threes times daily - continue exercise - maybe do every other day and see if fatigue improves - encourage research participation  - [place]  - IN several weeks we will have another study called Galatctic and we will give you that consent form too - We will decide on CT scan for  monitoring at followup  Followup  - chck LFT in 1 month  - in 3 months do Pre-bd spiro and dlco only. No lung volume or bd response. No post-bd spiro - - return to ILD clinic - Tuesday   > 50% of this > 25 min visit spent in face to face counseling or coordination of care      Dr. Brand Males, M.D., [facility].C.P Pulmonary and Critical Care Medicine Staff Physician, [place] Director - Interstitial Lung Disease  Program  Pulmonary [place] [facility] [place], [place], [address]  Pager: [phone], If no answer or between  15:00h - 7:00h: call [phone] Telephone: [phone]

## 2017-09-20 NOTE — Patient Instructions (Addendum)
ICD-10-CM   1. 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[place]) J84.112   2. Encounter for therapeutic drug monitoring Z51.81      Stable clinically Fatigue might be exercise related or esbriet Low appetite and weight loss likely related to esbreit Glad nausea from esbriet resolved with ginger and other measures Sun sensitivity likely related to esbriet Too bad esbriet expensive for you  Plan  - continue esbriet ; seems despite side effect is mild and you are tolerating it   - ensure you apply sun screen all the time  - check LFT 09/20/2017   - can roll over to the 888m+ pill threes times daily - continue exercise - maybe do every other day and see if fatigue improves - encourage research participation  - [place]  - IN several weeks we will have another study called Galatctic and we will give you that consent form too - We will decide on CT scan for monitoring at followup  Followup  - chck LFT in 1 month  - in 3 months do Pre-bd spiro and dlco only. No lung volume or bd response. No post-bd spiro - return to ILD clinic - Tuesday PM

## 2017-09-20 NOTE — Addendum Note (Signed)
Addended by: Lorretta Harp on: 09/20/2017 10:32 AM   Modules accepted: Orders

## 2017-11-16 DIAGNOSIS — R82998 Other abnormal findings in urine: Secondary | ICD-10-CM | POA: Diagnosis not present

## 2017-11-17 DIAGNOSIS — E7849 Other hyperlipidemia: Secondary | ICD-10-CM | POA: Diagnosis not present

## 2017-11-17 DIAGNOSIS — R7301 Impaired fasting glucose: Secondary | ICD-10-CM | POA: Diagnosis not present

## 2017-11-17 DIAGNOSIS — Z125 Encounter for screening for malignant neoplasm of prostate: Secondary | ICD-10-CM | POA: Diagnosis not present

## 2017-11-22 DIAGNOSIS — R7301 Impaired fasting glucose: Secondary | ICD-10-CM | POA: Diagnosis not present

## 2017-11-22 DIAGNOSIS — R0609 Other forms of dyspnea: Secondary | ICD-10-CM | POA: Diagnosis not present

## 2017-11-22 DIAGNOSIS — Z0000 Encounter for general adult medical examination without abnormal findings: Secondary | ICD-10-CM | POA: Diagnosis not present

## 2017-11-22 DIAGNOSIS — N528 Other male erectile dysfunction: Secondary | ICD-10-CM | POA: Diagnosis not present

## 2017-11-22 DIAGNOSIS — E7849 Other hyperlipidemia: Secondary | ICD-10-CM | POA: Diagnosis not present

## 2017-11-22 DIAGNOSIS — Z1389 Encounter for screening for other disorder: Secondary | ICD-10-CM | POA: Diagnosis not present

## 2017-11-22 DIAGNOSIS — I2510 Atherosclerotic heart disease of native coronary artery without angina pectoris: Secondary | ICD-10-CM | POA: Diagnosis not present

## 2017-11-22 DIAGNOSIS — J302 Other seasonal allergic rhinitis: Secondary | ICD-10-CM | POA: Diagnosis not present

## 2017-11-22 DIAGNOSIS — J84112 Idiopathic pulmonary fibrosis: Secondary | ICD-10-CM | POA: Diagnosis not present

## 2017-11-22 DIAGNOSIS — K4090 Unilateral inguinal hernia, without obstruction or gangrene, not specified as recurrent: Secondary | ICD-10-CM | POA: Diagnosis not present

## 2017-11-22 DIAGNOSIS — Z6826 Body mass index (BMI) 26.0-26.9, adult: Secondary | ICD-10-CM | POA: Diagnosis not present

## 2017-11-22 DIAGNOSIS — K589 Irritable bowel syndrome without diarrhea: Secondary | ICD-10-CM | POA: Diagnosis not present

## 2017-11-26 DIAGNOSIS — Z1212 Encounter for screening for malignant neoplasm of rectum: Secondary | ICD-10-CM | POA: Diagnosis not present

## 2017-12-07 ENCOUNTER — Ambulatory Visit (INDEPENDENT_AMBULATORY_CARE_PROVIDER_SITE_OTHER): Payer: PPO | Admitting: Internal Medicine

## 2017-12-07 ENCOUNTER — Other Ambulatory Visit (INDEPENDENT_AMBULATORY_CARE_PROVIDER_SITE_OTHER): Payer: PPO

## 2017-12-07 ENCOUNTER — Encounter: Payer: Self-pay | Admitting: Internal Medicine

## 2017-12-07 VITALS — BP 122/68 | HR 91 | Ht 70.0 in | Wt 180.0 lb

## 2017-12-07 DIAGNOSIS — J84112 Idiopathic pulmonary fibrosis: Secondary | ICD-10-CM | POA: Diagnosis not present

## 2017-12-07 DIAGNOSIS — Z5181 Encounter for therapeutic drug level monitoring: Secondary | ICD-10-CM | POA: Diagnosis not present

## 2017-12-07 LAB — PULMONARY FUNCTION TEST
DL/VA % pred: 87 %
DL/VA: 4 ml/min/mmHg/L
DLCO unc % pred: 66 %
DLCO unc: 21.2 ml/min/mmHg
FEF 25-75 Pre: 3.78 L/sec
FEF2575-%Pred-Pre: 178 %
FEV1-%Pred-Pre: 99 %
FEV1-Pre: 2.95 L
FEV1FVC-%Pred-Pre: 115 %
FEV6-%Pred-Pre: 91 %
FEV6-Pre: 3.54 L
FEV6FVC-%Pred-Pre: 106 %
FVC-%Pred-Pre: 86 %
FVC-Pre: 3.54 L
Pre FEV1/FVC ratio: 83 %
Pre FEV6/FVC Ratio: 100 %

## 2017-12-07 LAB — HEPATIC FUNCTION PANEL
ALT: 22 U/L (ref 0–53)
AST: 21 U/L (ref 0–37)
Albumin: 4.2 g/dL (ref 3.5–5.2)
Alkaline Phosphatase: 57 U/L (ref 39–117)
Bilirubin, Direct: 0.1 mg/dL (ref 0.0–0.3)
Total Bilirubin: 0.4 mg/dL (ref 0.2–1.2)
Total Protein: 6.4 g/dL (ref 6.0–8.3)

## 2017-12-07 NOTE — Progress Notes (Signed)
Patient completed pre Kenneth [name] and DLCO today.

## 2017-12-07 NOTE — Patient Instructions (Addendum)
ICD-10-CM   1. 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[facility]) J84.112 Hepatic function panel  2. Encounter for therapeutic drug monitoring Z51.81 Hepatic function panel    Stable disease on lung function Fatigue, Low appetite, Weight loss, Nausea  - from esbriet.   - Glad this is only mild and tolerable Glad you are doing a good fitness regimen Glad no further sun burn and you are wearing sunscreen  PLAN  - continue esbriet at full dose    - take with meals, use ginger as needed  - drink fluids  - apply sunscreen always - keep up with workout regimen - can try inspiratory muscle load training - 5-10 times on the hour - 10 times in day  - print out example of 2 devices - no requirement for oxygen - appreciate you being on IPF PRO research registry - appreciate willingness to participate in interventional trials  - take print out of upcming trials - call us before your CT aorta scan because if your are going to be in a research trial you might needs CT  - check LFT 12/07/2017 as standard of care  Followup  - 3 months do Pre-bd spiro and dlco only. No lung volume or bd response. No post-bd spiro - return to see DrRamaswamy in ILD clinic in 3 months

## 2017-12-07 NOTE — Progress Notes (Signed)
Subjective:     Patient ID: Kenneth [name], male   DOB: 05/16/1941, 77 y.o.   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 He tells me that this summer 2018*noticing insidious onset of shortness of breath particularly with walking stairs in the house or [place]. This was not that in the previous years. The summer 2018 he was in [place] but did not notice much shortness of breath. Otherwise he is able to do his activities of daily living and played golf with a car. He is more bothered by his back pain and sciatica as a result of previous back surgery. Shortness of breath is extremely mild. He says he became more aware of it after the radiologic investigations documented below. He had a chest x-ray that suggested interstitial findings and therefore he underwent a high-resolution CT scan of the chest that is described below. I personally visualized this high resolution CT chest and to me it shows bilateral bibasal subpleural reticulation that also extends to the upper lobes without any zonal predominance. There is no obvious honeycombing but there is traction bronchiectasis. There no mediastinal adenopathy. Therefore he is been referred here. In terms of exposure history other than radiation exposure he has not been on any pulmonary toxic drugs or any mold exposure or asbestos exposure. Does have GERD and is on PPI and is well ocntrolled   Does have spring and perennial allergies - sneezes easy. Allergy test with [facility] negative some years ago  [employer] chest physicians interstitial lung disease questionnaire: He says that he does not cough. He is only trouble with dyspnea with strenuous exercise and it started 4 months ago. Past medical history significant for acid reflux. He  has never smoked any tobacco or street drugs. Does not have any family history of lung disease. At home he does not have any humidifier sound birds heart the water damage or mold. 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 He has standard radiation exposure while at work. The standard radiation for a radiologist. He does not take any pulmonary toxic drugs. House is 76 years old and he worries about mold. He sneezes if air is forced   Pulmonary function test shows isolated reduction in diffusion capacity to 62%. Walking desaturation test underneath her feet 3 laps on room air: Resting pulse ox 96%. Final pulse ox 95%. Heart rate resting was 92/m and went up to 109 minute.   Results for KEVYN, BOQUET (MRN [id number]) as of 03/23/2017 10:04  Ref. Range 03/04/2017 09:13  FVC-Pre Latest Units: L 3.59  FVC-%Pred-Pre Latest Units: % 86  Results for BRENNIN, DURFEE (MRN [id number]) as of 03/23/2017 10:04  Ref. Range 03/04/2017 09:13  DLCO cor Latest Units: ml/min/mmHg 20.06  DLCO cor % pred Latest Units: % 63    Lungs/Pleura: There is subpleural reticulation with scattered ground-glass and traction bronchiolectasis, without a definite zonal predominance. No definitive honeycombing. Findings appear mildly progressive when radiographs dating back to 12/28/2008 are reviewed. Probable 5 mm subpleural lymph node along the right major fissure. No pleural fluid. Airway is unremarkable.      IMPRESSION: 1. Pulmonary parenchymal findings of interstitial lung disease which may be due to fibrotic nonspecific interstitial  pneumonitis or, given slight progression over time, usual interstitial pneumonitis. 2. Aortic atherosclerosis (ICD10-170.0). Coronary artery calcification. 3. Aortic aneurysm NOS (ICD10-I71.9). 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 Recommend annual imaging followup by CTA or MRA. This recommendation follows  2010 ACCF/AHA/AATS/ACR/ASA/SCA/SCAI/SIR/STS/SVM Guidelines for the Diagnosis and Management of Patients with Thoracic Aortic Disease. Circulation. 2010; 121: S010-X323. 4. Prominence of the right and left main pulmonary arteries can be seen with pulmonary arterial hypertension.   Electronically Signed   By: [name]   On: 02/17/2017 13:41     OV 03/23/2017  Chief Complaint  Patient presents with  . Follow-up    SOB on exertion. Other than that he has been doing about the same. Denies any cough or CP.   Here to discuss results. [name]. 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 In inteirm no new symptoms.  Autoimmune profile negative. They raise possibility of 2nd opinion.  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 Progression is based on CXR comparison since 2010; there is no CT and is only suspicion of progression  Results for ELIO, HADEN (MRN [id number]) as of 03/23/2017 10:04  Ref. Range 12/08/2011 16:44 03/04/2017 13:47  Anit Nuclear Antibody(ANA) Latest Ref Range: NEGATIVE   NEG  Angiotensin-Converting Enzyme Latest Ref Range: 9 - 67 U/L  31  Cyclic Citrullin Peptide Ab Latest Units: Units  <16  ds DNA Ab Latest Units: IU/mL  <1  ENA RNP Ab Latest Ref Range: 0.0 - 0.9 AI  0.2  Endomysial Screen Latest Ref Range: NEGATIVE  NEGATIVE   Deamidated Gliadin Abs, IgG Latest Ref Range: <20 U/mL 4.1   Gliadin IgA Latest Ref Range: <20 U/mL 1.8   RA Latex Turbid. Latest Ref Range: <14 IU/mL  <14  Tissue Transglut Ab Latest Ref Range: <20 U/mL 3.9   Tissue Transglutaminase Ab, IgA Latest Ref Range: <20 U/mL 2.2   IgE (Immunoglobulin E), Serum Latest Ref Range: <115 kU/L  31  IgA Latest Ref Range: 68 - 379 mg/dL 132   SSA (Ro) (ENA) Antibody, IgG Latest Ref Range: <1.0 NEG AI   <1.0 NEG  SSB (La) (ENA) Antibody, IgG Latest Ref Range: <1.0 NEG AI   <1.0 NEG    OV  05/06/2017  Chief Complaint  Patient presents with  . Follow-up    Follow up today after beginning [place]. Pt has no c/o SOB, cough, or CP. Has had some mild nausea from the [place] but other than that, he has been doing good on it.   ollow-up idiopathic pulmonary fibrosis mild severity  He is here to follow-up He is on the third week of his bed at 3 pills 3 times a day max dose. So far is tolerating it fine other than mild nausea. He does take after meals. He plans to meet with [facility] coordinator for medication support. He did visit [place] this past weekend and did have vomiting especially after consuming few etoh drinks at a [employer]. He does apply sunscreen [his daughter is a Paediatric nurse in [place]. He still complains about the fact that and is 77 year old home when the air conditioner comes on and they air blows out of the duct system he does sneeze 10 times and his feet does feel cold. She plans to put hepa filters that are high and and also get the duct cleaning. He says he did have allergy evaluation  and found few years ago and it was negative. Recently he has increased his rpinrole and alsogot an injection for his back and he does feel better   OV 06/22/2017  Chief Complaint  Patient presents with  . Follow-up    Pt still taking Esbriet and has been doing good except has been having issues with nausea x3 days. PFT done today. Still becomes SOB with exertion. Denies any complaints of cough or CP.   IPF followup  Routine followup. Now on esbriet 3 pills tid since end oct 2018. Having new nausea - moderate, x 3 days. Might be chills. But no associated diarrhea or other side effects. Spacing esbriet 4h only. Also has complaitns about high co pay. Unable to find charity. Doing exrcise with trainer - feeels that is better and more vigorous than rehab. Gets tachy with eercise but says he does not desaturate. Had PFT - stable. Lot of questions about disease    Walking  desaturation test on 06/22/2017 185 feet x 3 laps on ROOM AIR:  did NOT desaturate. Rest pulse ox was 100%, final pulse ox was 99%. HR response 84/min at rest to 97/min at peak exertion. Patient GIUSEPPE DUCHEMIN  Did not Desaturate < 88% . Kenneth [name] did not  Desaturated </= 3% points. Srihan A Steward yes did get tachyardic   OV 09/20/2017  Chief Complaint  Patient presents with  . Follow-up    PFT done today.  Pt states he has been doing good. Pt still taking Esbriet and states he is doing good on it.   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  Joen Laura for IPF follow-up.  Last visit December 2018.  Since then he has been compliant fully with his Pirfenidone (Esbriet).  His nausea resolved with Ginger intake.  However he is having 3-5 pound weight loss and also fatigue towards the end of the day and also low appetite.  He is exercising vigorously 5 times a week and he is wondering if the fatigue could be because of the heavy exercise.  He is not interested in lung transplantation.  He is open to research participation in interventional trial.  Currently he is on the IPF registry program.  Overall he is well.  He did go to [place] to visit his son and he did not wear sunscreen and he did pick up a stage I skin burn with erythema that was more than usual.  But this resolved.  I did remind him about his obligation to wear sunscreen at all times with Pirfenidone (Esbriet).  He is interested in interventional research trials.  His pulmonary function test to me on an average is stable even though there is decline in FVC that seems to be an increase in DLCO so that is variability.  His walking desaturation test is around the same. He is interested in rolling over to the larger capsule of the Pirfenidone (Esbriet) so we will have to take it only 3 times a day. [employer] is cposting him $500/month or more due to high AGI and this is frustrating for him   Walking desaturation test on 09/20/2017 185 feet x 3 laps on ROOM AIR:  did  walk briskly all 3 laps with only mild dyspnea desaturate. Rest pulse ox was 98%, final pulse ox was 98%. HR response 96/min at rest to 107/min at peak exertion. Patient BERNADETTE ARMIJO  Did not Desaturate < 88% . Kenneth [name] did not  Desaturated </= 3% points. Marylyn Ishihara A Hallum yes did  get tachyardic this appears similar to a few months ago   OV 12/07/2017  Chief Complaint  Patient presents with  . Follow-up    Pt had pre Arlyce Harman and DLCO prior to OV today. Pt has increase of SOB with exertion more with walking up the hill.   Dr. Annamaria Boots presents for follow-up of his IPF to ILD clinic.  In terms of his IPF he feels stable.  In fact he tells me that if not for the incidental chest x-ray and a subsequent CT scan that picked up pulmonary fibrosis he even to this day will not know that he has IPF.  He only has a mild exertional dyspnea class I which she always believes in the past that it was because of aging.  At this point in time he is on Esbriet 1 big pill 3 times daily at full dose.  He is now applying sunscreen and has not had any further sunburns.  He is following an extremely intense exercise fitness regimen to improve his cardiac conditioning.  He believes his exertional heart rate and resting heart rate are much improved than before.  He works with a Clinical research associate at [employer].  His main issue is from Pirfenidone [facility]).  He had some mild nausea that has now resolved with ginger capsules but he continues to have some amount of fatigue low appetite and some mild weight loss.  He says this is mild and all tolerable.  The fatigue happens after working a lot in the yard and he loves yard work.  He wants to know if he can continue to do yard work.  Recently he did have some sinus congestion and took some steroids and antibiotics for it and he feels better but he still has some sinus fullness.  He had spirometry and simple walking desaturation test and the profile is below and I believe these are stable.  In fact  his exertional heart rate is improved compared to previous.  Today he also participated in the IPF registry program.  There is a noninterventional trial.  He is interested in future interventional trials.  Results for SADAT, SLIWA (MRN [id number]) as of 12/07/2017 14:41  Ref. Range 03/04/2017 09:13 06/22/2017 09:02 09/20/2017 08:54 12/07/2017 13:49  FVC-Pre Latest Units: L 3.59 3.70 3.47 3.54  FVC-%Pred-Pre Latest Units: % 86 89 84 86   Results for AYCE, PIETRZYK A (MRN [id number]) as of 12/07/2017 14:41  Ref. Range 03/04/2017 09:13 06/22/2017 09:02 09/20/2017 08:54 12/07/2017 13:49  DLCO unc Latest Units: ml/min/mmHg 20.12 19.77 24.06 21.20  DLCO unc % pred Latest Units: % 63 62 76 66    Simple office walk 185 feet x  3 laps goal with forehead probe 12/07/2017   O2 used Room air  Number laps completed 3  Comments about pace Moderate pace  Resting Pulse Ox/HR 100% and 83/min  Final Pulse Ox/HR 99% and 98/min  Desaturated </= 88% no  Desaturated <= 3% points no  Got Tachycardic >/= 90/min yes  Symptoms at end of test No complaints  Miscellaneous comments x     Review of Systems     Objective:   Physical Exam  Constitutional: He is oriented to person, place, and time. He appears well-developed and well-nourished. No distress.  HENT:  Head: Normocephalic and atraumatic.  Right Ear: External ear normal.  Left Ear: External ear normal.  Mouth/Throat: Oropharynx is clear and moist. No oropharyngeal exudate.  Eyes: Pupils are equal, round, and reactive to light. Conjunctivae  and EOM are normal. Right eye exhibits no discharge. Left eye exhibits no discharge. No scleral icterus.  Neck: Normal range of motion. Neck supple. No JVD present. No tracheal deviation present. No thyromegaly present.  Cardiovascular: Normal rate, regular rhythm and intact distal pulses. Exam reveals no gallop and no friction rub.  No murmur heard. Pulmonary/Chest: Effort normal. No respiratory distress. He has no wheezes. He  has rales. He exhibits no tenderness.  Abdominal: Soft. Bowel sounds are normal. He exhibits no distension and no mass. There is no tenderness. There is no rebound and no guarding.  Musculoskeletal: Normal range of motion. He exhibits no edema or tenderness.  Lymphadenopathy:    He has no cervical adenopathy.  Neurological: He is alert and oriented to person, place, and time. He has normal reflexes. No cranial nerve deficit. Coordination normal.  Skin: Skin is warm and dry. No rash noted. He is not diaphoretic. No erythema. No pallor.  Psychiatric: He has a normal mood and affect. His behavior is normal. Judgment and thought content normal.  Nursing note and vitals reviewed.  Vitals:   12/07/17 1409  BP: 122/68  Pulse: 91  SpO2: 95%  Weight: 180 lb (81.6 kg)  Height: _0  (1.778 m)    Estimated body mass index is 25.83 kg/m as calculated from the following:   Height as of this encounter: _1  (1.778 m).   Weight as of this encounter: 180 lb (81.6 kg).     Assessment:       ICD-10-CM   1. 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HCC) J84.112 Hepatic function panel  2. Encounter for therapeutic drug monitoring Z51.81 Hepatic function panel       Plan:     Stable disease on lung function Fatigue, Low appetite, Weight loss, Nausea  - from esbriet.   - Glad this is only mild and tolerable Glad you are doing a good fitness regimen Glad no further sun burn and you are wearing sunscreen  PLAN  - continue esbriet at full dose    - take with meals, use ginger as needed  - drink fluids  - apply sunscreen always - keep up with workout regimen - can try inspiratory muscle load training - 5-10 times on the hour - 10 times in day  - print out example of 2 devices - no requirement for oxygen - appreciate you being on IPF PRO research registry - appreciate willingness to participate in interventional trials  - take print out of upcming trials - call us before your CT aorta scan  because if your are going to be in a research trial you might needs CT  - check LFT 12/07/2017 as standard of care  Followup  - 3 months do Pre-bd spiro and dlco only. No lung volume or bd response. No post-bd spiro - return to see DrRamaswamy in ILD clinic in 3 months   > 50% of this > 25 min visit spent in face to face counseling or coordination of care    Dr. Brand Males, M.D., [facility].C.P Pulmonary and Critical Care Medicine Staff Physician, [place] Director - Interstitial Lung Disease  Program  Pulmonary [place] [facility] [place], [place], [address]  Pager: [phone], If no answer or between  15:00h - 7:00h: call [phone] Telephone: [phone]

## 2017-12-08 NOTE — Progress Notes (Signed)
    IPF PRO Registry Purpose: To collect data and biological samples that will support future research studies.  Registry will describe current approaches to diagnosis and treatment of IPF, analyze participant characteristics to describe the natural history of the disease, assess quality of life, describe participants interactions with the health care system, describe IPF treatment practices across multiple institutions, and utilize biological samples linked to well characterized IPF participants to identify disease biomarkers.      Clinical Research Coordinator / Research RN note : This visit for [name] with DOB: 1941-04-07 on 12/07/2017 for the above protocol is Visit/Encounter #12 month follow up  and is for purpose of research . The consent for this encounter is under protocol version date August 06, 2015 approved version date Februrary 2, 2018 and  is currently IRB approved.The subject expressed continued interest and consent in continuing as a study subject. Subject confirmed that there was  no change in contact information (e.g. address, telephone, email). Subject thanked for participation in research and contribution to science.   In this visit 12/07/2017 the subject returned to clinic for his 12 month IPF PRO registry follow up research visit. The subject expressed his continued interest in being a research study participant. The subject completed all required questionnaires and had blood samples collected as required per above mentioned protocol. For further information regarding today's visit please refer to subjects paper source  [place].     Signed by  [name] BS, [place] Coordinator [place], [place] 7:25 [place] 12/08/2017

## 2018-01-07 ENCOUNTER — Other Ambulatory Visit: Payer: Self-pay | Admitting: Neurosurgery

## 2018-01-07 ENCOUNTER — Ambulatory Visit
Admission: RE | Admit: 2018-01-07 | Discharge: 2018-01-07 | Disposition: A | Discharge status: Home / Self Care | Payer: PPO | Source: Ambulatory Visit | Attending: Neurosurgery | Admitting: Neurosurgery

## 2018-01-07 DIAGNOSIS — M48061 Spinal stenosis, lumbar region without neurogenic claudication: Secondary | ICD-10-CM

## 2018-01-07 DIAGNOSIS — M47817 Spondylosis without myelopathy or radiculopathy, lumbosacral region: Secondary | ICD-10-CM | POA: Diagnosis not present

## 2018-01-07 MED ORDER — IOPAMIDOL (ISOVUE-M 200) INJECTION 41%: 1.0000 mL | Freq: Once | INTRAMUSCULAR | Status: DC

## 2018-01-07 MED ORDER — METHYLPREDNISOLONE ACETATE 40 MG/ML INJ SUSP (RADIOLOG: 120.0000 mg | Freq: Once | INTRAMUSCULAR | Status: DC

## 2018-03-04 ENCOUNTER — Other Ambulatory Visit: Payer: Self-pay | Admitting: Internal Medicine

## 2018-03-04 DIAGNOSIS — I712 Thoracic aortic aneurysm, without rupture: Secondary | ICD-10-CM

## 2018-03-04 DIAGNOSIS — I7121 Aneurysm of the ascending aorta, without rupture: Secondary | ICD-10-CM

## 2018-03-08 ENCOUNTER — Ambulatory Visit (INDEPENDENT_AMBULATORY_CARE_PROVIDER_SITE_OTHER): Payer: PPO | Admitting: Internal Medicine

## 2018-03-08 ENCOUNTER — Other Ambulatory Visit (INDEPENDENT_AMBULATORY_CARE_PROVIDER_SITE_OTHER): Payer: PPO

## 2018-03-08 ENCOUNTER — Ambulatory Visit: Payer: PPO | Admitting: Internal Medicine

## 2018-03-08 ENCOUNTER — Encounter: Payer: Self-pay | Admitting: Internal Medicine

## 2018-03-08 VITALS — BP 130/74 | HR 85 | Ht 69.5 in | Wt 174.6 lb

## 2018-03-08 DIAGNOSIS — M791 Myalgia, unspecified site: Secondary | ICD-10-CM | POA: Diagnosis not present

## 2018-03-08 DIAGNOSIS — Z5181 Encounter for therapeutic drug level monitoring: Secondary | ICD-10-CM

## 2018-03-08 DIAGNOSIS — J84112 Idiopathic pulmonary fibrosis: Secondary | ICD-10-CM

## 2018-03-08 DIAGNOSIS — R634 Abnormal weight loss: Secondary | ICD-10-CM | POA: Diagnosis not present

## 2018-03-08 DIAGNOSIS — R110 Nausea: Secondary | ICD-10-CM | POA: Diagnosis not present

## 2018-03-08 DIAGNOSIS — T50905A Adverse effect of unspecified drugs, medicaments and biological substances, initial encounter: Secondary | ICD-10-CM

## 2018-03-08 LAB — BASIC METABOLIC PANEL
BUN: 10 mg/dL (ref 6–23)
CO2: 30 mEq/L (ref 19–32)
Calcium: 9.3 mg/dL (ref 8.4–10.5)
Chloride: 100 mEq/L (ref 96–112)
Creatinine, Ser: 0.75 mg/dL (ref 0.40–1.50)
GFR: 107.51 mL/min (ref >60.00)
Glucose, Bld: 90 mg/dL (ref 70–99)
Potassium: 4.1 mEq/L (ref 3.5–5.1)
Sodium: 136 mEq/L (ref 135–145)

## 2018-03-08 LAB — PULMONARY FUNCTION TEST
DL/VA % pred: 86 %
DL/VA: 3.96 ml/min/mmHg/L
DLCO unc % pred: 62 %
DLCO unc: 19.63 ml/min/mmHg
FEF 25-75 Pre: 3.04 L/sec
FEF2575-%Pred-Pre: 144 %
FEV1-%Pred-Pre: 97 %
FEV1-Pre: 2.88 L
FEV1FVC-%Pred-Pre: 112 %
FEV6-%Pred-Pre: 91 %
FEV6-Pre: 3.52 L
FEV6FVC-%Pred-Pre: 106 %
FVC-%Pred-Pre: 86 %
FVC-Pre: 3.56 L
Pre FEV1/FVC ratio: 81 %
Pre FEV6/FVC Ratio: 99 %

## 2018-03-08 LAB — HEPATIC FUNCTION PANEL
ALT: 18 U/L (ref 0–53)
AST: 19 U/L (ref 0–37)
Albumin: 4.4 g/dL (ref 3.5–5.2)
Alkaline Phosphatase: 67 U/L (ref 39–117)
Bilirubin, Direct: 0.1 mg/dL (ref 0.0–0.3)
Total Bilirubin: 0.4 mg/dL (ref 0.2–1.2)
Total Protein: 7.1 g/dL (ref 6.0–8.3)

## 2018-03-08 LAB — CK: Total CK: 91 U/L (ref 7–232)

## 2018-03-08 LAB — CREATININE KINASE MB: CK-MB: 2 ng/mL (ref 0.3–4.0)

## 2018-03-08 NOTE — Progress Notes (Signed)
Spirometry and Dlco done today. 

## 2018-03-08 NOTE — Patient Instructions (Addendum)
ICD-10-CM   1. 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[place]) J84.112   2. Encounter for therapeutic drug monitoring Z51.81   3. Nausea R11.0   4. Drug-induced weight loss R63.4    T50.905A   5. Myalgia M79.10      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[place]) Encounter for therapeutic drug monitoring Nausea Drug-induced weight loss  - IPF might have progressed a bit; clinically you seem stable though - Esbriet definitely causing nuasea and 11# weight loss since last year - we discussed and conclusion is to continue esbriet for now  - refer [place]  Transplant clinic - check bmet and lft test for monitoring - will email you IPF research protocols end of the week/next week - have emailed Dr Rosario Jacks about timing and protocol  For your next HRCT chest  - till we get a repl;y please see if you can hold off thoracic aorta CT  Myalgia  - maybe statin related - check CK and CK-MB  Followup 2 months do spirometry and dlco - if they do one at [place] close to our visit - you can cancel on this  2 months in ILD Clinic - for weight loss and simple walk test and esbriet

## 2018-03-08 NOTE — Addendum Note (Signed)
Addended by: Lorretta Harp on: 03/08/2018 11:46 AM   Modules accepted: Orders

## 2018-03-08 NOTE — Progress Notes (Signed)
Subjective:     Patient ID: Kenneth [name], male   DOB: 1940-07-30, 77 y.o.   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 He tells me that this summer 2018*noticing insidious onset of shortness of breath particularly with walking stairs in the house or [place]. This was not that in the previous years. The summer 2018 he was in [place] but did not notice much shortness of breath. Otherwise he is able to do his activities of daily living and played golf with a car. He is more bothered by his back pain and sciatica as a result of previous back surgery. Shortness of breath is extremely mild. He says he became more aware of it after the radiologic investigations documented below. He had a chest x-ray that suggested interstitial findings and therefore he underwent a high-resolution CT scan of the chest that is described below. I personally visualized this high resolution CT chest and to me it shows bilateral bibasal subpleural reticulation that also extends to the upper lobes without any zonal predominance. There is no obvious honeycombing but there is traction bronchiectasis. There no mediastinal adenopathy. Therefore he is been referred here. In terms of exposure history other than radiation exposure he has not been on any pulmonary toxic drugs or any mold exposure or asbestos exposure. Does have GERD and is on PPI and is well ocntrolled   Does have spring and perennial allergies - sneezes easy. Allergy test with [facility] negative some years ago  [employer] chest physicians interstitial lung disease questionnaire: He says that he does not cough. He is only trouble with dyspnea with strenuous exercise and it started 4 months ago. Past medical history significant for acid reflux. He  has never smoked any tobacco or street drugs. Does not have any family history of lung disease. At home he does not have any humidifier sound birds heart the water damage or mold. 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 He has standard radiation exposure while at work. The standard radiation for a radiologist. He does not take any pulmonary toxic drugs. House is 76 years old and he worries about mold. He sneezes if air is forced   Pulmonary function test shows isolated reduction in diffusion capacity to 62%. Walking desaturation test underneath her feet 3 laps on room air: Resting pulse ox 96%. Final pulse ox 95%. Heart rate resting was 92/m and went up to 109 minute.   Results for TWAIN, STENSETH (MRN [id number]) as of 03/23/2017 10:04  Ref. Range 03/04/2017 09:13  FVC-Pre Latest Units: L 3.59  FVC-%Pred-Pre Latest Units: % 86  Results for ROSEMARY, PENTECOST (MRN [id number]) as of 03/23/2017 10:04  Ref. Range 03/04/2017 09:13  DLCO cor Latest Units: ml/min/mmHg 20.06  DLCO cor % pred Latest Units: % 63    Lungs/Pleura: There is subpleural reticulation with scattered ground-glass and traction bronchiolectasis, without a definite zonal predominance. No definitive honeycombing. Findings appear mildly progressive when radiographs dating back to 12/28/2008 are reviewed. Probable 5 mm subpleural lymph node along the right major fissure. No pleural fluid. Airway is unremarkable.      IMPRESSION: 1. Pulmonary parenchymal findings of interstitial lung disease which may be due to fibrotic nonspecific  interstitial pneumonitis or, given slight progression over time, usual interstitial pneumonitis. 2. Aortic atherosclerosis (ICD10-170.0). Coronary artery calcification. 3. Aortic aneurysm NOS (ICD10-I71.9). 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 Recommend annual imaging followup by CTA or MRA. This recommendation follows  2010 ACCF/AHA/AATS/ACR/ASA/SCA/SCAI/SIR/STS/SVM Guidelines for the Diagnosis and Management of Patients with Thoracic Aortic Disease. Circulation. 2010; 121: O270-J500. 4. Prominence of the right and left main pulmonary arteries can be seen with pulmonary arterial hypertension.   Electronically Signed   By: [name]   On: 02/17/2017 13:41     OV 03/23/2017  Chief Complaint  Patient presents with  . Follow-up    SOB on exertion. Other than that he has been doing about the same. Denies any cough or CP.   Here to discuss results. [name]. 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 In inteirm no new symptoms.  Autoimmune profile negative. They raise possibility of 2nd opinion.  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 Progression is based on CXR comparison since 2010; there is no CT and is only suspicion of progression  Results for POWELL, HALBERT (MRN [id number]) as of 03/23/2017 10:04  Ref. Range 12/08/2011 16:44 03/04/2017 13:47  Anit Nuclear Antibody(ANA) Latest Ref Range: NEGATIVE   NEG  Angiotensin-Converting Enzyme Latest Ref Range: 9 - 67 U/L  31  Cyclic Citrullin Peptide Ab Latest Units: Units  <16  ds DNA Ab Latest Units: IU/mL  <1  ENA RNP Ab Latest Ref Range: 0.0 - 0.9 AI  0.2  Endomysial Screen Latest Ref Range: NEGATIVE  NEGATIVE   Deamidated Gliadin Abs, IgG Latest Ref Range: <20 U/mL 4.1   Gliadin IgA Latest Ref Range: <20 U/mL 1.8   RA Latex Turbid. Latest Ref Range: <14 IU/mL  <14  Tissue Transglut Ab Latest Ref Range: <20 U/mL 3.9   Tissue Transglutaminase Ab, IgA Latest Ref Range: <20 U/mL 2.2   IgE (Immunoglobulin E), Serum Latest Ref Range: <115 kU/L  31  IgA Latest Ref Range: 68 - 379 mg/dL 132   SSA (Ro) (ENA) Antibody, IgG Latest Ref Range: <1.0 NEG AI   <1.0 NEG  SSB (La) (ENA) Antibody, IgG Latest Ref Range: <1.0 NEG AI   <1.0 NEG    OV  05/06/2017  Chief Complaint  Patient presents with  . Follow-up    Follow up today after beginning [place]. Pt has no c/o SOB, cough, or CP. Has had some mild nausea from the [place] but other than that, he has been doing good on it.   ollow-up idiopathic pulmonary fibrosis mild severity  He is here to follow-up He is on the third week of his bed at 3 pills 3 times a day max dose. So far is tolerating it fine other than mild nausea. He does take after meals. He plans to meet with [facility] coordinator for medication support. He did visit [place] this past weekend and did have vomiting especially after consuming few etoh drinks at a [employer]. He does apply sunscreen [his daughter is a Paediatric nurse in [place]. He still complains about the fact that and is 77 year old home when the air conditioner comes on and they air blows out of the duct system he does sneeze 10 times and his feet does feel cold. She plans to put hepa filters that are high and and also get the duct cleaning. He says he did have allergy  evaluation and found few years ago and it was negative. Recently he has increased his rpinrole and alsogot an injection for his back and he does feel better   OV 06/22/2017  Chief Complaint  Patient presents with  . Follow-up    Pt still taking Esbriet and has been doing good except has been having issues with nausea x3 days. PFT done today. Still becomes SOB with exertion. Denies any complaints of cough or CP.   IPF followup  Routine followup. Now on esbriet 3 pills tid since end oct 2018. Having new nausea - moderate, x 3 days. Might be chills. But no associated diarrhea or other side effects. Spacing esbriet 4h only. Also has complaitns about high co pay. Unable to find charity. Doing exrcise with trainer - feeels that is better and more vigorous than rehab. Gets tachy with eercise but says he does not desaturate. Had PFT - stable. Lot of questions about disease    Walking  desaturation test on 06/22/2017 185 feet x 3 laps on ROOM AIR:  did NOT desaturate. Rest pulse ox was 100%, final pulse ox was 99%. HR response 84/min at rest to 97/min at peak exertion. Patient SAVIER TRICKETT  Did not Desaturate < 88% . Kenneth [name] did not  Desaturated </= 3% points. Detravion A [place] yes did get tachyardic   OV 09/20/2017  Chief Complaint  Patient presents with  . Follow-up    PFT done today.  Pt states he has been doing good. Pt still taking Esbriet and states he is doing good on it.   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  Joen Laura for IPF follow-up.  Last visit December 2018.  Since then he has been compliant fully with his Pirfenidone (Esbriet).  His nausea resolved with Ginger intake.  However he is having 3-5 pound weight loss and also fatigue towards the end of the day and also low appetite.  He is exercising vigorously 5 times a week and he is wondering if the fatigue could be because of the heavy exercise.  He is not interested in lung transplantation.  He is open to research participation in interventional trial.  Currently he is on the IPF registry program.  Overall he is well.  He did go to [place] to visit his son and he did not wear sunscreen and he did pick up a stage I skin burn with erythema that was more than usual.  But this resolved.  I did remind him about his obligation to wear sunscreen at all times with Pirfenidone (Esbriet).  He is interested in interventional research trials.  His pulmonary function test to me on an average is stable even though there is decline in FVC that seems to be an increase in DLCO so that is variability.  His walking desaturation test is around the same. He is interested in rolling over to the larger capsule of the Pirfenidone (Esbriet) so we will have to take it only 3 times a day. [facility] is cposting him $500/month or more due to high AGI and this is frustrating for him   Walking desaturation test on 09/20/2017 185 feet x 3 laps on ROOM AIR:  did  walk briskly all 3 laps with only mild dyspnea desaturate. Rest pulse ox was 98%, final pulse ox was 98%. HR response 96/min at rest to 107/min at peak exertion. Patient MAVERYK RENSTROM  Did not Desaturate < 88% . Kenneth [name] did not  Desaturated </= 3% points. Guillerm[place] yes  did get tachyardic this appears similar to a few months ago   OV 12/07/2017  Chief Complaint  Patient presents with  . Follow-up    Pt had pre Arlyce Harman and DLCO prior to OV today. Pt has increase of SOB with exertion more with walking up the hill.   Dr. Annamaria Boots presents for follow-up of his IPF to ILD clinic.  In terms of his IPF he feels stable.  In fact he tells me that if not for the incidental chest x-ray and a subsequent CT scan that picked up pulmonary fibrosis he even to this day will not know that he has IPF.  He only has a mild exertional dyspnea class I which she always believes in the past that it was because of aging.  At this point in time he is on Esbriet 1 big pill 3 times daily at full dose.  He is now applying sunscreen and has not had any further sunburns.  He is following an extremely intense exercise fitness regimen to improve his cardiac conditioning.  He believes his exertional heart rate and resting heart rate are much improved than before.  He works with a Clinical research associate at [employer].  His main issue is from Pirfenidone [facility]).  He had some mild nausea that has now resolved with ginger capsules but he continues to have some amount of fatigue low appetite and some mild weight loss.  He says this is mild and all tolerable.  The fatigue happens after working a lot in the yard and he loves yard work.  He wants to know if he can continue to do yard work.  Recently he did have some sinus congestion and took some steroids and antibiotics for it and he feels better but he still has some sinus fullness.  He had spirometry and simple walking desaturation test and the profile is below and I believe these are stable.  In fact  his exertional heart rate is improved compared to previous.  Today he also participated in the IPF registry program.  There is a noninterventional trial.  He is interested in future interventional trials.   OV 03/08/2018  Chief Complaint  Patient presents with  . Follow-up    PFT performed today. Pt states he has been doing well except he has been having problems with nausea and dizziness, and loss of appetite.   Kenneth [name] - presents for follow-up. For his IPF. He is on esbruet now. He was tolerating his Pirfenidone (Esbriet) quite well other than minor side effects till last visit. However in thesummer of 2019e's had increased nauseaand also weight loss. He has lost 6 pounds since prior visit. Overall he is lost 11 pounds since September 2018. We started the medication esbeit  anti-fibrotic for him in October 2018. His pants are getting looser.he is also having significant fatigue. Nevertheless he does not want to switch to the of anti-fibrotic ofev because of prior history of irritable bowel syndrome. He says that his baseline irritable bowel syndrome is worse than any side effects he is having with esbriet especially the fatigue.e is open to a transplant evaluation and consideration of research protocols. His spirometry shows stability with FVC but his DLCO might be declining.Marland Kitchen His simple walk test did not show anychange. He is not noticing any subjective changes in dyspnea on effort tolerance while working out although he does admit to increased dizziness particularly when playing golf. During this time recently has not monitored his pulse ox but in the  past and never went below 92%.  Other issues: He had some myalgia despite change in his statin. He will address this with his primary care. 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 Primary care has ordered a follow-up CT scan.This can be combined with a high risk CT scan. I I have reached out to  the thoracic radiology  Results for  ANTONIOUS, OMAHONEY (MRN [id number]) as of 03/08/2018 11:09  Ref. Range 09/20/2017 08:54 12/07/2017 13:49 03/08/2018 10:02  FVC-Pre Latest Units: L 3.47 3.54 3.56  FVC-%Pred-Pre Latest Units: % 84 86 86   Results for MITSUGI, SCHRADER A (MRN [id number]) as of 03/08/2018 11:09  Ref. Range 09/20/2017 08:54 12/07/2017 13:49 03/08/2018 10:02  DLCO unc Latest Units: ml/min/mmHg 24.06 21.20 19.63  DLCO unc % pred Latest Units: % 76 66 62     Simple office walk 185 feet x  3 laps goal with forehead probe 12/07/2017  03/08/2018   O2 used Room air Room air  Number laps completed 3 3  Comments about pace Moderate pace Mod pace  Resting Pulse Ox/HR 100% and 83/min 100% ad 85/min  Final Pulse Ox/HR 99% and 98/min 98$ and 104/min  Desaturated </= 88% no no  Desaturated <= 3% points no no  Got Tachycardic >/= 90/min yes yes  Symptoms at end of test No complaints No complaints  Miscellaneous comments x ? sligh increase in tachy/stable     has a past medical history of Arthritis, Diverticulosis of colon (without mention of hemorrhage), GERD (gastroesophageal reflux disease), Hiatal hernia, HLD (hyperlipidemia), Internal hemorrhoids without mention of complication, Irritable bowel syndrome, Other specified gastritis without mention of hemorrhage, and Pulmonary fibrosis ([place]).   reports that he has never smoked. He has never used smokeless tobacco.  Past Surgical History:  Procedure Laterality Date  . CARPAL TUNNEL RELEASE     left  . CATARACT EXTRACTION  06/2011   bilateral  . [place]   x 2  . LUMBAR LAMINECTOMY/DECOMPRESSION MICRODISCECTOMY Left 07/11/2013   Procedure: LUMBAR ONE TO TWO, LUMBAR TWO TO THREE, LUMBAR THREE TO FOUR LUMBAR LAMINECTOMY/DECOMPRESSION MICRODISCECTOMY 3 LEVELS;  Surgeon: Charlie Pitter, MD;  Location: [facility];  Service: Neurosurgery;  Laterality: Left;    No Known Allergies  Immunization History  Administered Date(s) Administered  . Influenza, High Dose Seasonal PF  03/06/2017  . Influenza,inj,Quad PF,6+ Mos 04/05/2016  . Pneumococcal-Unspecified 04/05/2016  . Zoster Recombinat (Shingrix) 01/03/2017    Family History  Problem Relation Age of Onset  . Heart failure Father 76  . Colon cancer Neg Hx   . Pancreatic cancer Neg Hx   . Rectal cancer Neg Hx   . Stomach cancer Neg Hx      Current Outpatient Medications:  .  acetaminophen-codeine (TYLENOL #3) 300-30 MG per tablet, Take 1 tablet by mouth every 8 (eight) hours as needed for moderate pain. , Disp: , Rfl:  .  carisoprodol (SOMA) 350 MG tablet, Take 175 mg by mouth 3 (three) times daily as needed for muscle spasms. , Disp: , Rfl:  .  Cholecalciferol (VITAMIN D-3) 1000 UNITS CAPS, Take 1,000 Units by mouth daily., Disp: , Rfl:  .  CO-ENZYME Q-10 PO, Take 1 tablet by mouth daily., Disp: , Rfl:  .  diclofenac (VOLTAREN) 75 MG EC tablet, Take 75 mg by mouth 2 (two) times daily., Disp: , Rfl: 2 .  diclofenac sodium (VOLTAREN) 1 % GEL, APPLY 4 GM UP TO 4  TIMES A DAY FOR KNEE ARTHRITIS, Disp: , Rfl: 12 .  dicyclomine (BENTYL) 10 MG capsule, Take one by mouth every 6 hours as needed for abdominal cramping, Disp: 30 capsule, Rfl: 1 .  diphenoxylate-atropine (LOMOTIL) 2.5-0.025 MG tablet, Take 1 tablet by mouth daily as needed., Disp: , Rfl:  .  esomeprazole (NEXIUM) 40 MG capsule, Take 40 mg by mouth every evening. , Disp: , Rfl:  .  fexofenadine (ALLEGRA) 30 MG tablet, Take 30 mg by mouth daily as needed (allergies). , Disp: , Rfl:  .  fluticasone (FLONASE) 50 MCG/ACT nasal spray, Place 2 sprays into the nose daily as needed for allergies. , Disp: , Rfl:  .  Fluticasone Propionate (XHANCE) 93 MCG/ACT EXHU, Place 1 spray into the nose 2 (two) times daily., Disp: , Rfl:  .  Glucosamine-Chondroitin (GLUCOSAMINE CHONDR COMPLEX PO), Take 1 tablet by mouth daily., Disp: , Rfl:  .  HYDROcodone-acetaminophen (NORCO/VICODIN) 5-325 MG per tablet, Take 1-2 tablets by mouth every 6 (six) hours as needed for moderate  pain., Disp: , Rfl:  .  meclizine (ANTIVERT) 25 MG tablet, Take 25 mg by mouth as needed for dizziness., Disp: , Rfl:  .  methocarbamol (ROBAXIN) 500 MG tablet, Take 1 tablet (500 mg total) by mouth every 8 (eight) hours as needed for muscle spasms., Disp: 40 tablet, Rfl: 0 .  Pirfenidone (ESBRIET) 801 MG TABS, Take 1 tablet by mouth 3 (three) times daily with meals., Disp: 90 tablet, Rfl: 12 .  rOPINIRole (REQUIP) 0.5 MG tablet, Take 0.5 mg by mouth at bedtime., Disp: , Rfl:  .  triamcinolone cream (KENALOG) 0.1 %, APPLY TO AFFECTED AREA, Disp: , Rfl: 12   Review of Systems     Objective:   Physical Exam  Constitutional: He is oriented to person, place, and time. He appears well-developed and well-nourished. No distress.  HENT:  Head: Normocephalic and atraumatic.  Right Ear: External ear normal.  Left Ear: External ear normal.  Mouth/Throat: Oropharynx is clear and moist. No oropharyngeal exudate.  Eyes: Pupils are equal, round, and reactive to light. Conjunctivae and EOM are normal. Right eye exhibits no discharge. Left eye exhibits no discharge. No scleral icterus.  Neck: Normal range of motion. Neck supple. No JVD present. No tracheal deviation present. No thyromegaly present.  Cardiovascular: Normal rate, regular rhythm and intact distal pulses. Exam reveals no gallop and no friction rub.  No murmur heard. Pulmonary/Chest: Effort normal and breath sounds normal. No respiratory distress. He has no wheezes. He has no rales. He exhibits no tenderness.  Abdominal: Soft. Bowel sounds are normal. He exhibits no distension and no mass. There is no tenderness. There is no rebound and no guarding.  Musculoskeletal: Normal range of motion. He exhibits no edema or tenderness.  Lymphadenopathy:    He has no cervical adenopathy.  Neurological: He is alert and oriented to person, place, and time. He has normal reflexes. No cranial nerve deficit. Coordination normal.  Skin: Skin is warm and dry.  No rash noted. He is not diaphoretic. No erythema. No pallor.  Psychiatric: He has a normal mood and affect. His behavior is normal. Judgment and thought content normal.  Nursing note and vitals reviewed.  Vitals:   03/08/18 1054  BP: 130/74  Pulse: 85  SpO2: 100%    Estimated body mass index is 25.83 kg/m as calculated from the following:   Height as of 12/07/17: _0  (1.778 m).   Weight as of 12/07/17: 180 lb (81.6 kg).  Assessment:       ICD-10-CM   1. 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[place]) J84.112   2. Encounter for therapeutic drug monitoring Z51.81   3. Nausea R11.0   4. Drug-induced weight loss R63.4    T50.905A   5. Myalgia M79.10        Plan:     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[place]) Encounter for therapeutic drug monitoring Nausea Drug-induced weight loss  - IPF might have progressed a bit; clinically you seem stable though - Esbriet definitely causing nuasea and 11# weight loss since last year - we discussed and conclusion is to continue esbriet for now  - refer [place]  Transplant clinic - check bmet and lft test for monitoring - will email you IPF research protocols end of the week/next week - have emailed Dr Rosario Jacks about timing and protocol  For your next HRCT chest  - till we get a repl;y please see if you can hold off thoracic aorta CT  Myalgia  - maybe statin related - check CK and CK-MB  Followup 2 months do spirometry and dlco - if they do one at [place] close to our visit - you can cancel on this  2 months in ILD Clinic - for weight loss and simple walk test and esbriet      ,> 50% of this > 40 min visit spent in face to face counseling or/and coordination of care - by this undersigned MD - Dr Brand Males. This includes one or more of the following documented above: discussion of test results, diagnostic or treatment recommendations, prognosis, risks and benefits of management options, instructions, education, compliance  or risk-factor reduction    SIGNATURE    Dr. Brand Males, M.D., F.C.C.P,  Pulmonary and Critical Care Medicine Staff Physician, [place] Director - Interstitial Lung Disease  Program  P[facility] [place] [facility] [place], [place], [address]  Pager: [phone], If no answer or between  15:00h - 7:00h: call [phone] Telephone: [phone]  11:39 AM 03/08/2018

## 2018-03-09 DIAGNOSIS — D1801 Hemangioma of skin and subcutaneous tissue: Secondary | ICD-10-CM | POA: Diagnosis not present

## 2018-03-09 DIAGNOSIS — L720 Epidermal cyst: Secondary | ICD-10-CM | POA: Diagnosis not present

## 2018-03-09 DIAGNOSIS — Z85828 Personal history of other malignant neoplasm of skin: Secondary | ICD-10-CM | POA: Diagnosis not present

## 2018-03-09 DIAGNOSIS — L821 Other seborrheic keratosis: Secondary | ICD-10-CM | POA: Diagnosis not present

## 2018-03-09 DIAGNOSIS — L570 Actinic keratosis: Secondary | ICD-10-CM | POA: Diagnosis not present

## 2018-03-09 DIAGNOSIS — L568 Other specified acute skin changes due to ultraviolet radiation: Secondary | ICD-10-CM | POA: Diagnosis not present

## 2018-03-12 DIAGNOSIS — Z23 Encounter for immunization: Secondary | ICD-10-CM | POA: Diagnosis not present

## 2018-03-30 ENCOUNTER — Ambulatory Visit
Admission: RE | Admit: 2018-03-30 | Discharge: 2018-03-30 | Disposition: A | Discharge status: Home / Self Care | Payer: PPO | Source: Ambulatory Visit | Attending: Internal Medicine | Admitting: Internal Medicine

## 2018-03-30 DIAGNOSIS — J8410 Pulmonary fibrosis, unspecified: Secondary | ICD-10-CM | POA: Diagnosis not present

## 2018-03-30 DIAGNOSIS — I7121 Aneurysm of the ascending aorta, without rupture: Secondary | ICD-10-CM

## 2018-03-30 DIAGNOSIS — I712 Thoracic aortic aneurysm, without rupture: Secondary | ICD-10-CM

## 2018-03-30 MED ORDER — IOPAMIDOL (ISOVUE-370) INJECTION 76%
75.0000 mL | Freq: Once | INTRAVENOUS | Status: AC | PRN
  Administered 2018-03-30: 75 mL via INTRAVENOUS

## 2018-03-31 DIAGNOSIS — Z23 Encounter for immunization: Secondary | ICD-10-CM | POA: Diagnosis not present

## 2018-03-31 DIAGNOSIS — E7849 Other hyperlipidemia: Secondary | ICD-10-CM | POA: Diagnosis not present

## 2018-05-10 ENCOUNTER — Encounter: Payer: Self-pay | Admitting: Internal Medicine

## 2018-05-10 ENCOUNTER — Ambulatory Visit: Payer: PPO | Admitting: Internal Medicine

## 2018-05-10 ENCOUNTER — Ambulatory Visit (INDEPENDENT_AMBULATORY_CARE_PROVIDER_SITE_OTHER): Payer: PPO | Admitting: Internal Medicine

## 2018-05-10 VITALS — BP 108/62 | HR 84 | Ht 68.0 in | Wt 174.0 lb

## 2018-05-10 DIAGNOSIS — R110 Nausea: Secondary | ICD-10-CM | POA: Diagnosis not present

## 2018-05-10 DIAGNOSIS — R634 Abnormal weight loss: Secondary | ICD-10-CM | POA: Diagnosis not present

## 2018-05-10 DIAGNOSIS — J84112 Idiopathic pulmonary fibrosis: Secondary | ICD-10-CM | POA: Diagnosis not present

## 2018-05-10 DIAGNOSIS — Z5181 Encounter for therapeutic drug level monitoring: Secondary | ICD-10-CM | POA: Diagnosis not present

## 2018-05-10 DIAGNOSIS — T50905A Adverse effect of unspecified drugs, medicaments and biological substances, initial encounter: Secondary | ICD-10-CM

## 2018-05-10 LAB — PULMONARY FUNCTION TEST
DL/VA % pred: 88 %
DL/VA: 3.95 ml/min/mmHg/L
DLCO unc % pred: 64 %
DLCO unc: 19.04 ml/min/mmHg
FEF 25-75 Pre: 3.08 L/sec
FEF2575-%Pred-Pre: 154 %
FEV1-%Pred-Pre: 96 %
FEV1-Pre: 2.67 L
FEV1FVC-%Pred-Pre: 112 %
FEV6-%Pred-Pre: 90 %
FEV6-Pre: 3.26 L
FEV6FVC-%Pred-Pre: 106 %
FVC-%Pred-Pre: 84 %
FVC-Pre: 3.27 L
Pre FEV1/FVC ratio: 82 %
Pre FEV6/FVC Ratio: 100 %

## 2018-05-10 NOTE — Patient Instructions (Addendum)
ICD-10-CM   1. 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[place]) J84.112   2. Encounter for therapeutic drug monitoring Z51.81   3. Drug-induced weight loss R63.4    T50.905A   4. Nausea R11.0    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[place]) Encounter for therapeutic drug monitoring  - clinically  stable on DLCO and HRCT May 2019 -> CTA Sept 2019 thought the CTs are not true comparison - FVC appears declined - recommend a HRCT to determine true progression - IF true progression on HRCT then consider change to ofev or addition of research protocol phase 3 studies - However, recommend opinion from [facility] Dr Shana Chute (have emailed him) to discuss diagnosis and strategies  Drug-induced weight loss and nausea - due to esbriet  - stabilized since sept 20129 -if bothersome can try 2 pills a day for a week or two or even do 7-10 day drug holiday  Followup - return for LFT in December 2019 and we will call with result (last in sept 2019 and normal) - 3-4 months do spiro/dlco - return to ILD clinic in 3-4 months

## 2018-05-10 NOTE — Progress Notes (Signed)
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 He tells me that this summer 2018*noticing insidious onset of shortness of breath particularly with walking stairs in the house or [place]. This was not that in the previous years. The summer 2018 he was in [place] but did not notice much shortness of breath. Otherwise he is able to do his activities of daily living and played golf with a car. He is more bothered by his back pain and sciatica as a result of previous back surgery. Shortness of breath is extremely mild. He says he became more aware of it after the radiologic investigations documented below. He had a chest x-ray that suggested interstitial findings and therefore he underwent a high-resolution CT scan of the chest that is described below. I personally visualized this high resolution CT chest and to me it shows bilateral bibasal subpleural reticulation that also extends to the upper lobes without any zonal predominance. There is no obvious honeycombing but there is traction bronchiectasis. There no mediastinal adenopathy. Therefore he is been referred here. In terms of exposure history other than radiation exposure he has not been on any pulmonary toxic drugs or any mold exposure or asbestos exposure. Does have GERD and is on PPI and is well ocntrolled   Does have spring and perennial allergies - sneezes easy. Allergy test with [facility] negative some years ago  [employer] chest physicians interstitial lung disease questionnaire: He says that he does not cough. He is only trouble with dyspnea with strenuous exercise and it started 4 months ago. Past medical history significant for acid reflux. He has never smoked any tobacco or street drugs. Does not have any family history of lung disease.  At home he does not have any humidifier sound birds heart the water damage or mold. 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 He has standard radiation exposure while at work. The standard radiation for a radiologist. He does not take any pulmonary toxic drugs. House is 76 years old and he worries about mold. He sneezes if air is forced   Pulmonary function test shows isolated reduction in diffusion capacity to 62%. Walking desaturation test underneath her feet 3 laps on room air: Resting pulse ox 96%. Final pulse ox 95%. Heart rate resting was 92/m and went up to 109 minute.   Results for MIKLO, AKEN (MRN [id number]) as of 03/23/2017 10:04  Ref. Range 03/04/2017 09:13  FVC-Pre Latest Units: L 3.59  FVC-%Pred-Pre Latest Units: % 86  Results for AABAN, GRIEP (MRN [id number]) as of 03/23/2017 10:04  Ref. Range 03/04/2017 09:13  DLCO cor Latest Units: ml/min/mmHg 20.06  DLCO cor % pred Latest Units: % 63    Lungs/Pleura: There is subpleural reticulation with scattered ground-glass and traction bronchiolectasis, without a definite zonal predominance. No definitive honeycombing. Findings appear mildly progressive when radiographs dating back to 12/28/2008 are reviewed. Probable 5 mm subpleural lymph node along the right major fissure. No pleural fluid. Airway is unremarkable.      IMPRESSION: 1. Pulmonary parenchymal findings of interstitial lung disease which may be due to fibrotic nonspecific interstitial pneumonitis or, given slight progression over time, usual interstitial pneumonitis. 2. Aortic atherosclerosis (ICD10-170.0). Coronary artery calcification. 3. Aortic aneurysm  NOS (ICD10-I71.9). 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 Recommend annual imaging followup by CTA or MRA. This recommendation follows 2010 ACCF/AHA/AATS/ACR/ASA/SCA/SCAI/SIR/STS/SVM Guidelines for the Diagnosis and Management of Patients with Thoracic  Aortic Disease. Circulation. 2010; 121: H852-D782. 4. Prominence of the right and left main pulmonary arteries can be seen with pulmonary arterial hypertension.   Electronically Signed   By: [name]   On: 02/17/2017 13:41     OV 03/23/2017  Chief Complaint  Patient presents with  . Follow-up    SOB on exertion. Other than that he has been doing about the same. Denies any cough or CP.   Here to discuss results. [name]. 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 In inteirm no new symptoms.  Autoimmune profile negative. They raise possibility of 2nd opinion.  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 Progression is based on CXR comparison since 2010; there is no CT and is only suspicion of progression  Results for ATTIKUS, BARTOSZEK (MRN [id number]) as of 03/23/2017 10:04  Ref. Range 12/08/2011 16:44 03/04/2017 13:47  Anit Nuclear Antibody(ANA) Latest Ref Range: NEGATIVE   NEG  Angiotensin-Converting Enzyme Latest Ref Range: 9 - 67 U/L  31  Cyclic Citrullin Peptide Ab Latest Units: Units  <16  ds DNA Ab Latest Units: IU/mL  <1  ENA RNP Ab Latest Ref Range: 0.0 - 0.9 AI  0.2  Endomysial Screen Latest Ref Range: NEGATIVE  NEGATIVE   Deamidated Gliadin Abs, IgG Latest Ref Range: <20 U/mL 4.1   Gliadin IgA Latest Ref Range: <20 U/mL 1.8   RA Latex Turbid. Latest Ref Range: <14 IU/mL  <14  Tissue Transglut Ab Latest Ref Range: <20 U/mL 3.9   Tissue Transglutaminase Ab, IgA Latest Ref Range: <20 U/mL 2.2   IgE (Immunoglobulin E), Serum Latest Ref Range: <115 kU/L  31  IgA Latest Ref Range: 68 - 379 mg/dL 132   SSA (Ro) (ENA) Antibody, IgG Latest Ref Range: <1.0 NEG AI   <1.0 NEG  SSB (La) (ENA) Antibody, IgG Latest Ref Range: <1.0 NEG AI   <1.0 NEG    OV 05/06/2017  Chief Complaint  Patient presents with  . Follow-up    Follow up today after beginning [place]. Pt has no c/o SOB,  cough, or CP. Has had some mild nausea from the Almond but other than that, he has been doing good on it.   ollow-up idiopathic pulmonary fibrosis mild severity  He is here to follow-up He is on the third week of his bed at 3 pills 3 times a day max dose. So far is tolerating it fine other than mild nausea. He does take after meals. He plans to meet with [facility] coordinator for medication support. He did visit [place] this past weekend and did have vomiting especially after consuming few etoh drinks at a [employer]. He does apply sunscreen [his daughter is a Paediatric nurse in [place]. He still complains about the fact that and is 77 year old home when the air conditioner comes on and they air blows out of the duct system he does sneeze 10 times and his feet does feel cold. She plans to put hepa filters that are high and and also get the duct cleaning. He says he did have allergy evaluation and found few years ago and it was negative. Recently he has increased his rpinrole and alsogot an injection for  his back and he does feel better   OV 06/22/2017  Chief Complaint  Patient presents with  . Follow-up    Pt still taking Esbriet and has been doing good except has been having issues with nausea x3 days. PFT done today. Still becomes SOB with exertion. Denies any complaints of cough or CP.   IPF followup  Routine followup. Now on esbriet 3 pills tid since end oct 2018. Having new nausea - moderate, x 3 days. Might be chills. But no associated diarrhea or other side effects. Spacing esbriet 4h only. Also has complaitns about high co pay. Unable to find charity. Doing exrcise with trainer - feeels that is better and more vigorous than rehab. Gets tachy with eercise but says he does not desaturate. Had PFT - stable. Lot of questions about disease    Walking desaturation test on 06/22/2017 185 feet x 3 laps on ROOM AIR:  did NOT desaturate. Rest pulse ox was 100%, final pulse ox was 99%. HR  response 84/min at rest to 97/min at peak exertion. Patient YANNICK STEUBER  Did not Desaturate < 88% . Westley Chandler did not  Desaturated </= 3% points. Burnard A Matsushita yes did get tachyardic   OV 09/20/2017  Chief Complaint  Patient presents with  . Follow-up    PFT done today.  Pt states he has been doing good. Pt still taking Esbriet and states he is doing good on it.   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  Joen Laura for IPF follow-up.  Last visit December 2018.  Since then he has been compliant fully with his Pirfenidone (Esbriet).  His nausea resolved with Ginger intake.  However he is having 3-5 pound weight loss and also fatigue towards the end of the day and also low appetite.  He is exercising vigorously 5 times a week and he is wondering if the fatigue could be because of the heavy exercise.  He is not interested in lung transplantation.  He is open to research participation in interventional trial.  Currently he is on the IPF registry program.  Overall he is well.  He did go to [place] to visit his son and he did not wear sunscreen and he did pick up a stage I skin burn with erythema that was more than usual.  But this resolved.  I did remind him about his obligation to wear sunscreen at all times with Pirfenidone (Esbriet).  He is interested in interventional research trials.  His pulmonary function test to me on an average is stable even though there is decline in FVC that seems to be an increase in DLCO so that is variability.  His walking desaturation test is around the same. He is interested in rolling over to the larger capsule of the Pirfenidone (Esbriet) so we will have to take it only 3 times a day. ESberit is cposting him $500/month or more due to high AGI and this is frustrating for him   Walking desaturation test on 09/20/2017 185 feet x 3 laps on ROOM AIR:  did walk briskly all 3 laps with only mild dyspnea desaturate. Rest pulse ox was 98%, final pulse ox was 98%. HR response 96/min at rest to  107/min at peak exertion. Patient KYZER BLOWE  Did not Desaturate < 88% . Westley Chandler did not  Desaturated </= 3% points. Guillermina [place] Stern yes did get tachyardic this appears similar to a few months ago   OV 12/07/2017  Chief Complaint  Patient presents  with  . Follow-up    Pt had pre Arlyce Harman and DLCO prior to OV today. Pt has increase of SOB with exertion more with walking up the hill.   Dr. Annamaria Boots presents for follow-up of his IPF to ILD clinic.  In terms of his IPF he feels stable.  In fact he tells me that if not for the incidental chest x-ray and a subsequent CT scan that picked up pulmonary fibrosis he even to this day will not know that he has IPF.  He only has a mild exertional dyspnea class I which she always believes in the past that it was because of aging.  At this point in time he is on Esbriet 1 big pill 3 times daily at full dose.  He is now applying sunscreen and has not had any further sunburns.  He is following an extremely intense exercise fitness regimen to improve his cardiac conditioning.  He believes his exertional heart rate and resting heart rate are much improved than before.  He works with a Clinical research associate at [employer].  His main issue is from Pirfenidone [employer]).  He had some mild nausea that has now resolved with ginger capsules but he continues to have some amount of fatigue low appetite and some mild weight loss.  He says this is mild and all tolerable.  The fatigue happens after working a lot in the yard and he loves yard work.  He wants to know if he can continue to do yard work.  Recently he did have some sinus congestion and took some steroids and antibiotics for it and he feels better but he still has some sinus fullness.  He had spirometry and simple walking desaturation test and the profile is below and I believe these are stable.  In fact his exertional heart rate is improved compared to previous.  Today he also participated in the IPF registry program.  There is a  noninterventional trial.  He is interested in future interventional trials.   OV 03/08/2018  Chief Complaint  Patient presents with  . Follow-up    PFT performed today. Pt states he has been doing well except he has been having problems with nausea and dizziness, and loss of appetite.   Westley Chandler - presents for follow-up. For his IPF. He is on esbruet now. He was tolerating his Pirfenidone (Esbriet) quite well other than minor side effects till last visit. However in thesummer of 2019e's had increased nauseaand also weight loss. He has lost 6 pounds since prior visit. Overall he is lost 11 pounds since September 2018. We started the medication esbeit  anti-fibrotic for him in October 2018. His pants are getting looser.he is also having significant fatigue. Nevertheless he does not want to switch to the of anti-fibrotic ofev because of prior history of irritable bowel syndrome. He says that his baseline irritable bowel syndrome is worse than any side effects he is having with esbriet especially the fatigue.e is open to a transplant evaluation and consideration of research protocols. His spirometry shows stability with FVC but his DLCO might be declining.Marland Kitchen His simple walk test did not show anychange. He is not noticing any subjective changes in dyspnea on effort tolerance while working out although he does admit to increased dizziness particularly when playing golf. During this time recently has not monitored his pulse ox but in the past and never went below 92%.  Other issues: He had some myalgia despite change in his statin. He will address  this with his primary care. 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 Primary care has ordered a follow-up CT scan.This can be combined with a high risk CT scan. I I have reached out to  the thoracic radiology     OV 05/10/2018  Subjective:  Patient ID: Westley Chandler, male , DOB: 06-Jul-1941 , age 68 y.o. , MRN: [id number] , ADDRESS: [place] [id number]   05/10/2018 -   Chief Complaint  Patient presents with  . Follow-up    review spiro with dlco.  c/o stable doe.  On 898m Esbriet TID, notes occ dizziness, loss of appetite.      HPI KLANDO ALCALDE89y.o. -returns 5-year follow-up.  Diagnosis made towards the end of 2018.  He has been on Esbriet since then.  His main issues have been weight loss.  He lost 10 pounds of weight at the time of last visit.  At the last visit he weighed 174 pounds in September 2019.  Back in August 2018 he weighed 186 pounds.  All this weight loss happen after he started Pirfenidone (Esbriet).  However he is now stabilized and he continues to be 874 pounds.  He is able to do activities of daily living and exercise but when he does stairs he gets dyspneic.  Overall he feels stable in the last 1 year in terms of his effort tolerance.  His main thing is that he has no appetite.  He has been communicating with the Pirfenidone (Esbriet) supporting about how to manage his low appetite and occasional nausea.  We discussed the options about taking brief holidays or reduction in dose.  He is willing to try this.  He does take occasional ginger.  Of note he had a [facility] because he was out of network.  He tells me that his insurance company only advised that he would have a higher co-pay so is a little bit puzzled by this.  Since then he said conversation with his primary care physician and he wants to see Dr. DShana Chutewho is a son-in-law to his friend Dr. BLindwood Quaretired sPsychologist, sport and exercise  I know Dr. GLauris Chromanquite well from an IPF standpoint.  And therefore wholeheartedly supported the idea.  In terms of his lung function testing his FVC shows a decline although he is feeling the same.  On miniature walking test he seems a little more tachycardic but he says he feels the same.  He manages this heart rate with his exercise.  His  DLCO with some variability appears stable.  He had a CT angiogram for thoracic aortic aneurysm in September 2019 and this when compared to one year earlier has been reported as stable but the different resolutions.  I offered to do a high-resolution CT chest here but he wants to hold off until he saw Dr. GLauris Chroman   Results for YLAKSH, HINNERS(MRN [id number] as of 05/10/2018 14:22  Ref. Range 06/22/2017 09:14 09/20/2017 08:54 12/07/2017 13:49 03/08/2018 10:02 05/10/2018 13:59  FVC-Pre Latest Units: L 3.70 3.47 3.54 3.56 3.27  FVC-%Pred-Pre Latest Units: % 89 84 86 86 84  Results for YJAVAD, SALVAA (MRN [id number] as of 05/10/2018 14:22  Ref. Range 06/22/2017 09:14 09/20/2017 08:54 12/07/2017 13:49 03/08/2018 10:02 05/10/2018 13:59  DLCO unc Latest Units: ml/min/mmHg 19.77 24.06 21.20 19.63 19.04  DLCO unc % pred Latest Units: % 62 76  66 62 64      Simple office walk 185 feet x  3 laps goal with forehead probe 12/07/2017  03/08/2018  05/10/2018   O2 used Room air Room air Room air  Number laps completed _0 Comments about pace Moderate pace Mod pace Mod fast pace  Resting Pulse Ox/HR 100% and 83/min 100% ad 85/min 100% and 91/min  Final Pulse Ox/HR 99% and 98/min 98$ and 104/min 98% and 108/min  Desaturated </= 88% no no no  Desaturated <= 3% points no no ni  Got Tachycardic >/= 90/min yes yes yes  Symptoms at end of test No complaints No complaints   Miscellaneous comments x ? sligh increase in tachy/stable ? incraeased tachty    ROS - per HPI     has a past medical history of Arthritis, Diverticulosis of colon (without mention of hemorrhage), GERD (gastroesophageal reflux disease), Hiatal hernia, HLD (hyperlipidemia), Internal hemorrhoids without mention of complication, Irritable bowel syndrome, Other specified gastritis without mention of hemorrhage, and Pulmonary fibrosis ([place]).   reports that he has never smoked. He has never used smokeless tobacco.  Past Surgical History:  Procedure  Laterality Date  . CARPAL TUNNEL RELEASE     left  . CATARACT EXTRACTION  06/2011   bilateral  . [place]   x 2  . LUMBAR LAMINECTOMY/DECOMPRESSION MICRODISCECTOMY Left 07/11/2013   Procedure: LUMBAR ONE TO TWO, LUMBAR TWO TO THREE, LUMBAR THREE TO FOUR LUMBAR LAMINECTOMY/DECOMPRESSION MICRODISCECTOMY 3 LEVELS;  Surgeon: Charlie Pitter, MD;  Location: [place] [facility];  Service: Neurosurgery;  Laterality: Left;    No Known Allergies  Immunization History  Administered Date(s) Administered  . Influenza, High Dose Seasonal PF 03/06/2017, 04/09/2018  . Influenza,inj,Quad PF,6+ Mos 04/05/2016  . Pneumococcal Conjugate-13 04/26/2018  . Pneumococcal-Unspecified 04/05/2016  . Zoster Recombinat (Shingrix) 01/03/2017    Family History  Problem Relation Age of Onset  . Heart failure Father 73  . Colon cancer Neg Hx   . Pancreatic cancer Neg Hx   . Rectal cancer Neg Hx   . Stomach cancer Neg Hx      Current Outpatient Medications:  .  acetaminophen-codeine (TYLENOL #3) 300-30 MG per tablet, Take 1 tablet by mouth every 8 (eight) hours as needed for moderate pain. , Disp: , Rfl:  .  carisoprodol (SOMA) 350 MG tablet, Take 175 mg by mouth 3 (three) times daily as needed for muscle spasms. , Disp: , Rfl:  .  Cholecalciferol (VITAMIN D-3) 1000 UNITS CAPS, Take 1,000 Units by mouth daily., Disp: , Rfl:  .  CO-ENZYME Q-10 PO, Take 1 tablet by mouth daily., Disp: , Rfl:  .  diclofenac (VOLTAREN) 75 MG EC tablet, Take 75 mg by mouth 2 (two) times daily., Disp: , Rfl: 2 .  diclofenac sodium (VOLTAREN) 1 % GEL, APPLY 4 GM UP TO 4 TIMES A DAY FOR KNEE ARTHRITIS, Disp: , Rfl: 12 .  dicyclomine (BENTYL) 10 MG capsule, Take one by mouth every 6 hours as needed for abdominal cramping, Disp: 30 capsule, Rfl: 1 .  diphenoxylate-atropine (LOMOTIL) 2.5-0.025 MG tablet, Take 1 tablet by mouth daily as needed., Disp: , Rfl:  .  esomeprazole (NEXIUM) 40 MG capsule, Take 40 mg by mouth every  evening. , Disp: , Rfl:  .  fexofenadine (ALLEGRA) 30 MG tablet, Take 30 mg by mouth daily as needed (allergies). , Disp: , Rfl:  .  fluticasone (FLONASE) 50 MCG/ACT nasal spray, Place 2 sprays  into the nose daily as needed for allergies. , Disp: , Rfl:  .  Fluticasone Propionate (XHANCE) 93 MCG/ACT EXHU, Place 1 spray into the nose 2 (two) times daily., Disp: , Rfl:  .  Glucosamine-Chondroitin (GLUCOSAMINE CHONDR COMPLEX PO), Take 1 tablet by mouth daily., Disp: , Rfl:  .  HYDROcodone-acetaminophen (NORCO/VICODIN) 5-325 MG per tablet, Take 1-2 tablets by mouth every 6 (six) hours as needed for moderate pain., Disp: , Rfl:  .  meclizine (ANTIVERT) 25 MG tablet, Take 25 mg by mouth as needed for dizziness., Disp: , Rfl:  .  methocarbamol (ROBAXIN) 500 MG tablet, Take 1 tablet (500 mg total) by mouth every 8 (eight) hours as needed for muscle spasms., Disp: 40 tablet, Rfl: 0 .  Pirfenidone (ESBRIET) 801 MG TABS, Take 1 tablet by mouth 3 (three) times daily with meals., Disp: 90 tablet, Rfl: 12 .  rOPINIRole (REQUIP) 0.5 MG tablet, Take 0.5 mg by mouth at bedtime., Disp: , Rfl:  .  triamcinolone cream (KENALOG) 0.1 %, APPLY TO AFFECTED AREA, Disp: , Rfl: 12      Objective:   Vitals:   05/10/18 1420  BP: 108/62  Pulse: 84  SpO2: 97%  Weight: 174 lb (78.9 kg)  Height: _0  (1.727 m)    Estimated body mass index is 26.46 kg/m as calculated from the following:   Height as of this encounter: _1  (1.727 m).   Weight as of this encounter: 174 lb (78.9 kg).  _2 @  [employer]   05/10/18 1420  Weight: 174 lb (78.9 kg)     Physical Exam  General Appearance:    Alert, cooperative, no distress, appears stated age - yes , Deconditioned looking - no , OBESE  - no, Sitting on Wheelchair -  no  Head:    Normocephalic, without obvious abnormality, atraumatic  Eyes:    PERRL, conjunctiva/corneas clear,  Ears:    Normal TM's and external ear canals, both ears  Nose:   Nares  normal, septum midline, mucosa normal, no drainage    or sinus tenderness. OXYGEN ON  - no . Patient is @ ra   Throat:   Lips, mucosa, and tongue normal; teeth and gums normal. Cyanosis on lips - no  Neck:   Supple, symmetrical, trachea midline, no adenopathy;    thyroid:  no enlargement/tenderness/nodules; no carotid   bruit or JVD  Back:     Symmetric, no curvature, ROM normal, no CVA tenderness  Lungs:     Distress - no , Wheeze no, Barrell Chest - no, Purse lip breathing - no, Crackles -baseline Velcro crackles  Chest Wall:    No tenderness or deformity.    Heart:    Regular rate and rhythm, S1 and S2 normal, no rub   or gallop, Murmur - no  Breast Exam:    NOT DONE  Abdomen:     Soft, non-tender, bowel sounds active all four quadrants,    no masses, no organomegaly. Visceral obesity - no  Genitalia:   NOT DONE  Rectal:   NOT DONE  Extremities:   Extremities - normal, Has Cane - no, Clubbing - no, Edema - no  Pulses:   2+ and symmetric all extremities  Skin:   Stigmata of Connective Tissue Disease - no  Lymph nodes:   Cervical, supraclavicular, and axillary nodes normal  Psychiatric:  Neurologic:   Pleasant - yes, Anxious - no, Flat affect - no  CAm-ICU - neg, Alert and Oriented x 3 -  yes, Moves all 4s - yes, Speech - normal, Cognition - intact           Assessment:       ICD-10-CM   1. 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[place]) J84.112 Ambulatory referral to Pulmonology  2. Encounter for therapeutic drug monitoring Z51.81 Hepatic function panel  3. Drug-induced weight loss R63.4    T50.905A   4. Nausea R11.0        Plan:     Patient Instructions     ICD-10-CM   1. 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[place]) J84.112   2. Encounter for therapeutic drug monitoring Z51.81   3. Drug-induced weight loss R63.4    T50.905A   4. Nausea R11.0    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[place]) Encounter for therapeutic drug monitoring  - clinically  stable on DLCO and HRCT May  2019 -> CTA Sept 2019 thought the CTs are not true comparison - FVC appears declined - recommend a HRCT to determine true progression - IF true progression on HRCT then consider change to ofev or addition of research protocol phase 3 studies - However, recommend opinion from [facility] Dr Shana Chute (have emailed him) to discuss diagnosis and strategies  Drug-induced weight loss and nausea - due to esbriet  - stabilized since sept 20129 -if bothersome can try 2 pills a day for a week or two or even do 7-10 day drug holiday  Followup - return for LFT in December 2019 and we will call with result (last in sept 2019 and normal) - 3-4 months do spiro/dlco - return to ILD clinic in 3-4 months   Mr.   > 50% of this > 40 min visit spent in face to face counseling or/and coordination of care - by this undersigned MD - Dr Brand Males. This includes one or more of the following documented above: discussion of test results, diagnostic or treatment recommendations, prognosis, risks and benefits of management options, instructions, education, compliance or risk-factor reduction    SIGNATURE    Dr. Brand Males, M.D., F.C.C.P,  Pulmonary and Critical Care Medicine Staff Physician, [place] Director - Interstitial Lung Disease  Program  P[facility] [place] at [place], [place], [address]  Pager: [phone], If no answer or between  15:00h - 7:00h: call [phone] Telephone: [phone]  3:02 PM 05/10/2018

## 2018-05-10 NOTE — Progress Notes (Signed)
PFT completed today.  

## 2018-05-30 DIAGNOSIS — H02835 Dermatochalasis of left lower eyelid: Secondary | ICD-10-CM | POA: Diagnosis not present

## 2018-05-30 DIAGNOSIS — H524 Presbyopia: Secondary | ICD-10-CM | POA: Diagnosis not present

## 2018-05-30 DIAGNOSIS — H02831 Dermatochalasis of right upper eyelid: Secondary | ICD-10-CM | POA: Diagnosis not present

## 2018-05-30 DIAGNOSIS — H02834 Dermatochalasis of left upper eyelid: Secondary | ICD-10-CM | POA: Diagnosis not present

## 2018-05-30 DIAGNOSIS — H02832 Dermatochalasis of right lower eyelid: Secondary | ICD-10-CM | POA: Diagnosis not present

## 2018-05-30 DIAGNOSIS — Z961 Presence of intraocular lens: Secondary | ICD-10-CM | POA: Diagnosis not present

## 2018-06-09 ENCOUNTER — Telehealth: Payer: Self-pay | Admitting: Internal Medicine

## 2018-06-09 NOTE — Telephone Encounter (Signed)
Per chart, records were sent to [facility] with referral. Spoke with pt to make aware.  Nothing further needed.

## 2018-06-20 DIAGNOSIS — R634 Abnormal weight loss: Secondary | ICD-10-CM | POA: Insufficient documentation

## 2018-06-20 DIAGNOSIS — K589 Irritable bowel syndrome without diarrhea: Secondary | ICD-10-CM | POA: Diagnosis not present

## 2018-06-20 DIAGNOSIS — K449 Diaphragmatic hernia without obstruction or gangrene: Secondary | ICD-10-CM | POA: Diagnosis not present

## 2018-06-20 DIAGNOSIS — J849 Interstitial pulmonary disease, unspecified: Secondary | ICD-10-CM | POA: Diagnosis not present

## 2018-06-20 DIAGNOSIS — K219 Gastro-esophageal reflux disease without esophagitis: Secondary | ICD-10-CM | POA: Diagnosis not present

## 2018-06-20 DIAGNOSIS — R0602 Shortness of breath: Secondary | ICD-10-CM | POA: Diagnosis not present

## 2018-06-22 ENCOUNTER — Telehealth: Payer: Self-pay | Admitting: Internal Medicine

## 2018-06-22 ENCOUNTER — Other Ambulatory Visit (INDEPENDENT_AMBULATORY_CARE_PROVIDER_SITE_OTHER): Payer: PPO

## 2018-06-22 DIAGNOSIS — J84112 Idiopathic pulmonary fibrosis: Secondary | ICD-10-CM

## 2018-06-22 DIAGNOSIS — Z5181 Encounter for therapeutic drug level monitoring: Secondary | ICD-10-CM | POA: Diagnosis not present

## 2018-06-22 NOTE — Telephone Encounter (Signed)
Call made to [facility], they are doing an audit and she is checking to see if we received any acknowledgment letters regarding this patient that was sent back in October. Updated fax number given and [facility] to refax. Voiced understanding. Nothing further is needed at this time.

## 2018-06-22 NOTE — Telephone Encounter (Signed)
Patient was very  Upset he is not able to see his encounter  From 05/10/18. I advised the patient I was unable to help fix problems  With my-chart if he had problems  He would need to call IT for mychart. He was upset with that and said it was my problem I needed to call and then he hung up.  Nothing further needed at this time.

## 2018-06-22 NOTE — Telephone Encounter (Signed)
Called pt and advised message from the provider. Pt understood and verbalized understanding. Nothing further is needed.   Pt will come by today to get labs drawn.

## 2018-06-22 NOTE — Telephone Encounter (Signed)
EMily  Please order LFT for Westley Chandler - he can come anytime and do it . For esbriet monitoring. Wioll call with results. Please let him know from dec 30-jan 7th, he can reach me on cell or email  Thanks    SIGNATURE    Dr. Brand Males, M.D., F.C.C.P,  Pulmonary and Critical Care Medicine Staff Physician, [place] Director - Interstitial Lung Disease  Program  P[facility] [place] at [place], [place], [address]  Pager: [phone], If no answer or between  15:00h - 7:00h: call [phone] Telephone: [phone]  12:25 PM 06/22/2018

## 2018-06-23 LAB — HEPATIC FUNCTION PANEL
ALT: 24 U/L (ref 0–53)
AST: 20 U/L (ref 0–37)
Albumin: 4 g/dL (ref 3.5–5.2)
Alkaline Phosphatase: 64 U/L (ref 39–117)
Bilirubin, Direct: 0.1 mg/dL (ref 0.0–0.3)
Total Bilirubin: 0.3 mg/dL (ref 0.2–1.2)
Total Protein: 6.5 g/dL (ref 6.0–8.3)

## 2018-07-19 DIAGNOSIS — J329 Chronic sinusitis, unspecified: Secondary | ICD-10-CM | POA: Diagnosis not present

## 2018-07-25 ENCOUNTER — Other Ambulatory Visit: Payer: Self-pay | Admitting: Otolaryngology

## 2018-07-25 DIAGNOSIS — J329 Chronic sinusitis, unspecified: Secondary | ICD-10-CM

## 2018-07-28 ENCOUNTER — Ambulatory Visit
Admission: RE | Admit: 2018-07-28 | Discharge: 2018-07-28 | Disposition: A | Discharge status: Home / Self Care | Payer: PPO | Source: Ambulatory Visit | Attending: Otolaryngology | Admitting: Otolaryngology

## 2018-07-28 DIAGNOSIS — J320 Chronic maxillary sinusitis: Secondary | ICD-10-CM | POA: Diagnosis not present

## 2018-07-28 DIAGNOSIS — J329 Chronic sinusitis, unspecified: Secondary | ICD-10-CM

## 2018-07-29 DIAGNOSIS — J310 Chronic rhinitis: Secondary | ICD-10-CM | POA: Diagnosis not present

## 2018-08-15 DIAGNOSIS — M4726 Other spondylosis with radiculopathy, lumbar region: Secondary | ICD-10-CM | POA: Diagnosis not present

## 2018-08-18 ENCOUNTER — Other Ambulatory Visit: Payer: Self-pay | Admitting: Neurological Surgery

## 2018-08-18 DIAGNOSIS — M48061 Spinal stenosis, lumbar region without neurogenic claudication: Secondary | ICD-10-CM

## 2018-08-22 ENCOUNTER — Other Ambulatory Visit: Payer: Self-pay | Admitting: Internal Medicine

## 2018-08-22 DIAGNOSIS — J84112 Idiopathic pulmonary fibrosis: Secondary | ICD-10-CM

## 2018-08-23 ENCOUNTER — Other Ambulatory Visit: Payer: Self-pay | Admitting: Neurological Surgery

## 2018-08-23 ENCOUNTER — Ambulatory Visit
Admission: RE | Admit: 2018-08-23 | Discharge: 2018-08-23 | Disposition: A | Discharge status: Home / Self Care | Payer: PPO | Source: Ambulatory Visit | Attending: Neurological Surgery | Admitting: Neurological Surgery

## 2018-08-23 ENCOUNTER — Ambulatory Visit: Payer: PPO | Admitting: Internal Medicine

## 2018-08-23 ENCOUNTER — Ambulatory Visit (INDEPENDENT_AMBULATORY_CARE_PROVIDER_SITE_OTHER): Payer: PPO | Admitting: Internal Medicine

## 2018-08-23 ENCOUNTER — Encounter: Payer: Self-pay | Admitting: Internal Medicine

## 2018-08-23 VITALS — BP 120/68 | HR 112 | Ht 68.0 in | Wt 178.0 lb

## 2018-08-23 DIAGNOSIS — M48061 Spinal stenosis, lumbar region without neurogenic claudication: Secondary | ICD-10-CM

## 2018-08-23 DIAGNOSIS — J849 Interstitial pulmonary disease, unspecified: Secondary | ICD-10-CM | POA: Diagnosis not present

## 2018-08-23 DIAGNOSIS — M549 Dorsalgia, unspecified: Secondary | ICD-10-CM

## 2018-08-23 DIAGNOSIS — J84112 Idiopathic pulmonary fibrosis: Secondary | ICD-10-CM | POA: Diagnosis not present

## 2018-08-23 DIAGNOSIS — M533 Sacrococcygeal disorders, not elsewhere classified: Secondary | ICD-10-CM | POA: Diagnosis not present

## 2018-08-23 DIAGNOSIS — Z5181 Encounter for therapeutic drug level monitoring: Secondary | ICD-10-CM

## 2018-08-23 DIAGNOSIS — R110 Nausea: Secondary | ICD-10-CM | POA: Diagnosis not present

## 2018-08-23 DIAGNOSIS — M545 Low back pain: Secondary | ICD-10-CM | POA: Diagnosis not present

## 2018-08-23 LAB — PULMONARY FUNCTION TEST
DL/VA % pred: 100 %
DL/VA: 3.99 ml/min/mmHg/L
DLCO unc % pred: 79 %
DLCO unc: 18.61 ml/min/mmHg
FEF 25-75 Pre: 3.08 L/sec
FEF2575-%Pred-Pre: 154 %
FEV1-%Pred-Pre: 94 %
FEV1-Pre: 2.64 L
FEV1FVC-%Pred-Pre: 114 %
FEV6-%Pred-Pre: 88 %
FEV6-Pre: 3.19 L
FEV6FVC-%Pred-Pre: 107 %
FVC-%Pred-Pre: 82 %
FVC-Pre: 3.19 L
Pre FEV1/FVC ratio: 83 %
Pre FEV6/FVC Ratio: 100 %

## 2018-08-23 LAB — HEPATIC FUNCTION PANEL
ALT: 20 U/L (ref 0–53)
AST: 24 U/L (ref 0–37)
Albumin: 4.3 g/dL (ref 3.5–5.2)
Alkaline Phosphatase: 65 U/L (ref 39–117)
Bilirubin, Direct: 0.1 mg/dL (ref 0.0–0.3)
Total Bilirubin: 0.5 mg/dL (ref 0.2–1.2)
Total Protein: 6.9 g/dL (ref 6.0–8.3)

## 2018-08-23 MED ORDER — IOPAMIDOL (ISOVUE-M 200) INJECTION 41%
1.0000 mL | Freq: Once | INTRAMUSCULAR | Status: AC
  Administered 2018-08-23: 1 mL via INTRA_ARTICULAR

## 2018-08-23 MED ORDER — METHYLPREDNISOLONE ACETATE 40 MG/ML INJ SUSP (RADIOLOG
120.0000 mg | Freq: Once | INTRAMUSCULAR | Status: AC
  Administered 2018-08-23: 120 mg via INTRA_ARTICULAR

## 2018-08-23 NOTE — Progress Notes (Signed)
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 He tells me that this summer 2018*noticing insidious onset of shortness of breath particularly with walking stairs in the house or [place]. This was not that in the previous years. The summer 2018 he was in [place] but did not notice much shortness of breath. Otherwise he is able to do his activities of daily living and played golf with a car. He is more bothered by his back pain and sciatica as a result of previous back surgery. Shortness of breath is extremely mild. He says he became more aware of it after the radiologic investigations documented below. He had a chest x-ray that suggested interstitial findings and therefore he underwent a high-resolution CT scan of the chest that is described below. I personally visualized this high resolution CT chest and to me it shows bilateral bibasal subpleural reticulation that also extends to the upper lobes without any zonal predominance. There is no obvious honeycombing but there is traction bronchiectasis. There no mediastinal adenopathy. Therefore he is been referred here. In terms of exposure history other than radiation exposure he has not been on any pulmonary toxic drugs or any mold exposure or asbestos exposure. Does have GERD and is on PPI and is well ocntrolled   Does have spring and perennial allergies - sneezes easy. Allergy test with [facility] negative some years ago  [employer] chest physicians interstitial lung disease questionnaire: He says that he does not cough. He is only trouble with dyspnea with strenuous exercise and it started 4 months ago. Past medical history significant for acid reflux. He has never smoked any tobacco or street drugs. Does not have any family history of lung disease.  At home he does not have any humidifier sound birds heart the water damage or mold. 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 He has standard radiation exposure while at work. The standard radiation for a radiologist. He does not take any pulmonary toxic drugs. House is 77 years old and he worries about mold. He sneezes if air is forced   Pulmonary function test shows isolated reduction in diffusion capacity to 62%. Walking desaturation test underneath her feet 3 laps on room air: Resting pulse ox 96%. Final pulse ox 95%. Heart rate resting was 92/m and went up to 109 minute.   Results for ZADE, FALKNER (MRN [id number]) as of 03/23/2017 10:04  Ref. Range 03/04/2017 09:13  FVC-Pre Latest Units: L 3.59  FVC-%Pred-Pre Latest Units: % 86  Results for PARVIN, STETZER (MRN [id number]) as of 03/23/2017 10:04  Ref. Range 03/04/2017 09:13  DLCO cor Latest Units: ml/min/mmHg 20.06  DLCO cor % pred Latest Units: % 63    Lungs/Pleura: There is subpleural reticulation with scattered ground-glass and traction bronchiolectasis, without a definite zonal predominance. No definitive honeycombing. Findings appear mildly progressive when radiographs dating back to 12/28/2008 are reviewed. Probable 5 mm subpleural lymph node along the right major fissure. No pleural fluid. Airway is unremarkable.      IMPRESSION: 1. Pulmonary parenchymal findings of interstitial lung disease which may be due to fibrotic nonspecific interstitial pneumonitis or, given slight progression over time, usual interstitial pneumonitis. 2. Aortic atherosclerosis (ICD10-170.0). Coronary artery calcification. 3. Aortic aneurysm NOS (  ICD10-I71.9). 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 Recommend annual imaging followup by CTA or MRA. This recommendation follows 2010 ACCF/AHA/AATS/ACR/ASA/SCA/SCAI/SIR/STS/SVM Guidelines for the Diagnosis and Management of Patients with Thoracic  Aortic Disease. Circulation. 2010; 121: F810-F751. 4. Prominence of the right and left main pulmonary arteries can be seen with pulmonary arterial hypertension.   Electronically Signed   By: [name]   On: 02/17/2017 13:41     OV 03/23/2017  Chief Complaint  Patient presents with  . Follow-up    SOB on exertion. Other than that he has been doing about the same. Denies any cough or CP.   Here to discuss results. [name]. 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 In inteirm no new symptoms.  Autoimmune profile negative. They raise possibility of 2nd opinion.  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 Progression is based on CXR comparison since 2010; there is no CT and is only suspicion of progression  Results for BOWMAN, HIGBIE (MRN [id number]) as of 03/23/2017 10:04  Ref. Range 12/08/2011 16:44 03/04/2017 13:47  Anit Nuclear Antibody(ANA) Latest Ref Range: NEGATIVE   NEG  Angiotensin-Converting Enzyme Latest Ref Range: 9 - 67 U/L  31  Cyclic Citrullin Peptide Ab Latest Units: Units  <16  ds DNA Ab Latest Units: IU/mL  <1  ENA RNP Ab Latest Ref Range: 0.0 - 0.9 AI  0.2  Endomysial Screen Latest Ref Range: NEGATIVE  NEGATIVE   Deamidated Gliadin Abs, IgG Latest Ref Range: <20 U/mL 4.1   Gliadin IgA Latest Ref Range: <20 U/mL 1.8   RA Latex Turbid. Latest Ref Range: <14 IU/mL  <14  Tissue Transglut Ab Latest Ref Range: <20 U/mL 3.9   Tissue Transglutaminase Ab, IgA Latest Ref Range: <20 U/mL 2.2   IgE (Immunoglobulin E), Serum Latest Ref Range: <115 kU/L  31  IgA Latest Ref Range: 68 - 379 mg/dL 132   SSA (Ro) (ENA) Antibody, IgG Latest Ref Range: <1.0 NEG AI   <1.0 NEG  SSB (La) (ENA) Antibody, IgG Latest Ref Range: <1.0 NEG AI   <1.0 NEG    OV 05/06/2017  Chief Complaint  Patient presents with  . Follow-up    Follow up today after beginning [place]. Pt has no c/o SOB,  cough, or CP. Has had some mild nausea from the [place] but other than that, he has been doing good on it.   ollow-up idiopathic pulmonary fibrosis mild severity  He is here to follow-up He is on the third week of his bed at 3 pills 3 times a day max dose. So far is tolerating it fine other than mild nausea. He does take after meals. He plans to meet with [facility] coordinator for medication support. He did visit [place] this past weekend and did have vomiting especially after consuming few etoh drinks at a [employer]. He does apply sunscreen [his daughter is a Paediatric nurse in [place]. He still complains about the fact that and is 78 year old home when the air conditioner comes on and they air blows out of the duct system he does sneeze 10 times and his feet does feel cold. She plans to put hepa filters that are high and and also get the duct cleaning. He says he did have allergy evaluation and found few years ago and it was negative. Recently he has increased his rpinrole and alsogot an injection for his  back and he does feel better   OV 06/22/2017  Chief Complaint  Patient presents with  . Follow-up    Pt still taking Esbriet and has been doing good except has been having issues with nausea x3 days. PFT done today. Still becomes SOB with exertion. Denies any complaints of cough or CP.   IPF followup  Routine followup. Now on esbriet 3 pills tid since end oct 2018. Having new nausea - moderate, x 3 days. Might be chills. But no associated diarrhea or other side effects. Spacing esbriet 4h only. Also has complaitns about high co pay. Unable to find charity. Doing exrcise with trainer - feeels that is better and more vigorous than rehab. Gets tachy with eercise but says he does not desaturate. Had PFT - stable. Lot of questions about disease    Walking desaturation test on 06/22/2017 185 feet x 3 laps on ROOM AIR:  did NOT desaturate. Rest pulse ox was 100%, final pulse ox was 99%. HR  response 84/min at rest to 97/min at peak exertion. Patient Kenneth [name]  Did not Desaturate < 88% . Westley Chandler did not  Desaturated </= 3% points. Thang A Khouri yes did get tachyardic   OV 09/20/2017  Chief Complaint  Patient presents with  . Follow-up    PFT done today.  Pt states he has been doing good. Pt still taking Esbriet and states he is doing good on it.   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  Joen Laura for IPF follow-up.  Last visit December 2018.  Since then he has been compliant fully with his Pirfenidone (Esbriet).  His nausea resolved with Ginger intake.  However he is having 3-5 pound weight loss and also fatigue towards the end of the day and also low appetite.  He is exercising vigorously 5 times a week and he is wondering if the fatigue could be because of the heavy exercise.  He is not interested in lung transplantation.  He is open to research participation in interventional trial.  Currently he is on the IPF registry program.  Overall he is well.  He did go to [place] to visit his son and he did not wear sunscreen and he did pick up a stage I skin burn with erythema that was more than usual.  But this resolved.  I did remind him about his obligation to wear sunscreen at all times with Pirfenidone (Esbriet).  He is interested in interventional research trials.  His pulmonary function test to me on an average is stable even though there is decline in FVC that seems to be an increase in DLCO so that is variability.  His walking desaturation test is around the same. He is interested in rolling over to the larger capsule of the Pirfenidone (Esbriet) so we will have to take it only 3 times a day. ESberit is cposting him $500/month or more due to high AGI and this is frustrating for him   Walking desaturation test on 09/20/2017 185 feet x 3 laps on ROOM AIR:  did walk briskly all 3 laps with only mild dyspnea desaturate. Rest pulse ox was 98%, final pulse ox was 98%. HR response 96/min at rest to  107/min at peak exertion. Patient KWELI GRASSEL  Did not Desaturate < 88% . Westley Chandler did not  Desaturated </= 3% points. Gu[place] Leonardo yes did get tachyardic this appears similar to a few months ago   OV 12/07/2017  Chief Complaint  Patient presents with  .  Follow-up    Pt had pre Arlyce Harman and DLCO prior to OV today. Pt has increase of SOB with exertion more with walking up the hill.   Dr. Annamaria Boots presents for follow-up of his IPF to ILD clinic.  In terms of his IPF he feels stable.  In fact he tells me that if not for the incidental chest x-ray and a subsequent CT scan that picked up pulmonary fibrosis he even to this day will not know that he has IPF.  He only has a mild exertional dyspnea class I which she always believes in the past that it was because of aging.  At this point in time he is on Esbriet 1 big pill 3 times daily at full dose.  He is now applying sunscreen and has not had any further sunburns.  He is following an extremely intense exercise fitness regimen to improve his cardiac conditioning.  He believes his exertional heart rate and resting heart rate are much improved than before.  He works with a Clinical research associate at [employer].  His main issue is from Pirfenidone [employer]).  He had some mild nausea that has now resolved with ginger capsules but he continues to have some amount of fatigue low appetite and some mild weight loss.  He says this is mild and all tolerable.  The fatigue happens after working a lot in the yard and he loves yard work.  He wants to know if he can continue to do yard work.  Recently he did have some sinus congestion and took some steroids and antibiotics for it and he feels better but he still has some sinus fullness.  He had spirometry and simple walking desaturation test and the profile is below and I believe these are stable.  In fact his exertional heart rate is improved compared to previous.  Today he also participated in the IPF registry program.  There is a  noninterventional trial.  He is interested in future interventional trials.   OV 03/08/2018  Chief Complaint  Patient presents with  . Follow-up    PFT performed today. Pt states he has been doing well except he has been having problems with nausea and dizziness, and loss of appetite.   Westley Chandler - presents for follow-up. For his IPF. He is on esbruet now. He was tolerating his Pirfenidone (Esbriet) quite well other than minor side effects till last visit. However in thesummer of 2019e's had increased nauseaand also weight loss. He has lost 6 pounds since prior visit. Overall he is lost 11 pounds since September 2018. We started the medication esbeit  anti-fibrotic for him in October 2018. His pants are getting looser.he is also having significant fatigue. Nevertheless he does not want to switch to the of anti-fibrotic ofev because of prior history of irritable bowel syndrome. He says that his baseline irritable bowel syndrome is worse than any side effects he is having with esbriet especially the fatigue.e is open to a transplant evaluation and consideration of research protocols. His spirometry shows stability with FVC but his DLCO might be declining.Marland Kitchen His simple walk test did not show anychange. He is not noticing any subjective changes in dyspnea on effort tolerance while working out although he does admit to increased dizziness particularly when playing golf. During this time recently has not monitored his pulse ox but in the past and never went below 92%.  Other issues: He had some myalgia despite change in his statin. He will address this with his  primary care. 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 Primary care has ordered a follow-up CT scan.This can be combined with a high risk CT scan. I I have reached out to  the thoracic radiology     OV 05/10/2018  Subjective:  Patient ID: Westley Chandler, male , DOB: [birth year] , age 32 y.o. , MRN: [id number] , ADDRESS: [place] [id number]   05/10/2018 -   Chief Complaint  Patient presents with  . Follow-up    review spiro with dlco.  c/o stable doe.  On 848m Esbriet TID, notes occ dizziness, loss of appetite.      HPI KZAI CHMIEL89y.o. -returns-year follow-up.  Diagnosis made towards the end of 2018.  He has been on Esbriet since then.  His main issues have been weight loss.  He lost 10 pounds of weight at the time of last visit.  At the last visit he weighed 174 pounds in September 2019.  Back in August 2018 he weighed 186 pounds.  All this weight loss happen after he started Pirfenidone (Esbriet).  However he is now stabilized and he continues to be 874 pounds.  He is able to do activities of daily living and exercise but when he does stairs he gets dyspneic.  Overall he feels stable in the last 1 year in terms of his effort tolerance.  His main thing is that he has no appetite.  He has been communicating with the Pirfenidone (Esbriet) supporting about how to manage his low appetite and occasional nausea.  We discussed the options about taking brief holidays or reduction in dose.  He is willing to try this.  He does take occasional ginger.  Of note he had a transplant referral [facility] because he was out of network.  He tells me that his insurance company only advised that he would have a higher co-pay so is a little bit puzzled by this.  Since then he said conversation with his primary care physician and he wants to see Dr. DShana Chutewho is a son-in-law to his friend Dr. BLindwood Quaretired sPsychologist, sport and exercise  I know Dr. GLauris Chromanquite well from an IPF standpoint.  And therefore wholeheartedly supported the idea.  In terms of his lung function testing his FVC shows a decline although he is feeling the same.  On miniature walking test he seems a little more tachycardic but he says he feels the same.  He manages this heart rate with his exercise.  His  DLCO with some variability appears stable.  He had a CT angiogram for thoracic aortic aneurysm in September 2019 and this when compared to one year earlier has been reported as stable but the different resolutions.  I offered to do a high-resolution CT chest here but he wants to hold off until he saw Dr. GLauris Chroman  OV 08/23/2018  Subjective:  Patient ID: KWestley Chandler male , DOB: [birth year], age 78y.o. , MRN: [id number], [place] [id number]  08/23/2018 -   Chief Complaint  Patient presents with  . Follow-up    PFT performed today. Pt states he has been doing well since last visit and denies any complaints.   IPF followup"   Diagnosis made towards the end of 2018.  He has been on Esbriet since then.   HPI KNOLE ROBEY744y.o. -Dr.  Ronny Flurry presents for follow-up of his IPF.  He continues to feel good.  His main issue is tolerating the Esbriet which gives some fatigue.  Sometimes he has to postpone it.  In terms of his dyspnea he continues to exercise.  His dyspnea symptom score is really minimal.  His cough is nonexistent.  He was losing weight with Esbriet but now he is gained his weight back.  In the interim he did see Dr. Shana Chute June 23, 2018 at [facility]  He had a satisfactory visit.  He thought he was stable.  His next visit with Dr. Lauris Chroman will be in June 2020.  Moving forward he wants to alternate every 6 months between myself and Dr. Lauris Chroman at [facility] which will give him 3 months with 1 pulmonologist or the other.  He is beginning to get interested in research studies.  We did pulmonary function test on him today and is documented below.  The FVC appears a fluctuant but the DLCO definitely appears on a downward trend.  The new DLCO is on a global lung initiated [GL I] equation and therefore the percent predicted is higher.  But the absolute value showed downward trend line.  We went over this.  He definitely does not feel this subjectively.  His  last high-resolution CT scan of the chest was in August of 2018.  He is willing to have one at follow-up.  He wants to see me again in 6 months.  His simple walking desaturation test appears stable.  He is thinking of making a trip to [place] and wants to know if he should wear a mask.    SYMPTOM SCALE - ILD 08/23/2018   O2 use ra  Shortness of Breath 0 -> 5 scale with 5 being worst (score 6 If unable to do)  At rest 0  Simple tasks - showers, clothes change, eating, shaving *0**  Household (dishes, doing bed, laundry) 0  Shopping 0  Walking level at own pace 0  Walking keeping up with others of same age 63  Walking up Stairs 2  Walking up Hill 2  Total (40 - 48) Dyspnea Score 4  How bad is your cough? 0  How bad is your fatigue 1 due to esbriet       Results for LENNYN, BELLANCA (MRN [id number]) as of 08/23/2018 10:33  Ref. Range 09/20/2017 08:54 12/07/2017 13:49 03/08/2018 10:02 05/10/2018 13:59 06/20/2018 [facility] 08/23/2018 09:29  FVC-Pre Latest Units: L 3.47 3.54 3.56 3.27 3.55 3.19  FVC-%Pred-Pre Latest Units: % 84 86 86 84  82  Results for CLAXTON, LEVITZ A (MRN [id number]) as of 08/23/2018 10:33  Ref. Range 09/20/2017 08:54 12/07/2017 13:49 03/08/2018 10:02 05/10/2018 13:59 06/20/18 [facility] 08/23/2018 09:29 - GLI equation  DLCO unc Latest Units: ml/min/mmHg 24.06 21.20 19.63 19.04 19.06 18.61  DLCO unc % pred Latest Units: % 76 66 62 64  79    Simple office walk 185 feet x  3 laps goal with forehead probe 12/07/2017 Weight 180# 03/08/2018 Weight 174.9 05/10/2018 Weight 174# 08/23/2018 Weight 178#  O2 used Room air Room air Room air Room air  Number laps completed _0 Comments about pace Moderate pace Mod pace Mod fast pace fast  Resting Pulse Ox/HR 100% and 83/min 100% ad 85/min 100% and 91/min 100% and 82/min  Final Pulse Ox/HR 99% and 98/min 98$ and 104/min 98% and 108/min 98% and 100/min  Desaturated </= 88% no no no no  Desaturated <=  3% points no no ni no  Got Tachycardic >/= 90/min yes yes  yes yes  Symptoms at end of test No complaints No complaints  No complaints  Miscellaneous comments x ? sligh increase in tachy/stable ? incraeased tachty        ROS - per HPI     has a past medical history of Arthritis, Diverticulosis of colon (without mention of hemorrhage), GERD (gastroesophageal reflux disease), Hiatal hernia, HLD (hyperlipidemia), Internal hemorrhoids without mention of complication, Irritable bowel syndrome, Other specified gastritis without mention of hemorrhage, and Pulmonary fibrosis ([place]).   reports that he has never smoked. He has never used smokeless tobacco.  Past Surgical History:  Procedure Laterality Date  . CARPAL TUNNEL RELEASE     left  . CATARACT EXTRACTION  06/2011   bilateral  . [place]   x 2  . LUMBAR LAMINECTOMY/DECOMPRESSION MICRODISCECTOMY Left 07/11/2013   Procedure: LUMBAR ONE TO TWO, LUMBAR TWO TO THREE, LUMBAR THREE TO FOUR LUMBAR LAMINECTOMY/DECOMPRESSION MICRODISCECTOMY 3 LEVELS;  Surgeon: Charlie Pitter, MD;  Location: [place] [facility];  Service: Neurosurgery;  Laterality: Left;    No Known Allergies  Immunization History  Administered Date(s) Administered  . Influenza, High Dose Seasonal PF 03/06/2017, 04/09/2018  . Influenza,inj,Quad PF,6+ Mos 04/05/2016  . Influenza-Unspecified 04/05/2016  . Pneumococcal Conjugate-13 04/26/2018  . Pneumococcal-Unspecified 04/05/2016  . Zoster Recombinat (Shingrix) 01/03/2017    Family History  Problem Relation Age of Onset  . Heart failure Father 90  . Colon cancer Neg Hx   . Pancreatic cancer Neg Hx   . Rectal cancer Neg Hx   . Stomach cancer Neg Hx      Current Outpatient Medications:  .  acetaminophen-codeine (TYLENOL #3) 300-30 MG per tablet, Take 1 tablet by mouth every 8 (eight) hours as needed for moderate pain. , Disp: , Rfl:  .  carisoprodol (SOMA) 350 MG tablet, Take 175 mg by mouth 3 (three) times daily as needed for muscle spasms. , Disp: , Rfl:  .   Cholecalciferol (VITAMIN D-3) 1000 UNITS CAPS, Take 1,000 Units by mouth daily., Disp: , Rfl:  .  CO-ENZYME Q-10 PO, Take 1 tablet by mouth daily., Disp: , Rfl:  .  diclofenac (VOLTAREN) 75 MG EC tablet, Take 75 mg by mouth 2 (two) times daily., Disp: , Rfl: 2 .  diclofenac sodium (VOLTAREN) 1 % GEL, APPLY 4 GM UP TO 4 TIMES A DAY FOR KNEE ARTHRITIS, Disp: , Rfl: 12 .  dicyclomine (BENTYL) 10 MG capsule, Take one by mouth every 6 hours as needed for abdominal cramping, Disp: 30 capsule, Rfl: 1 .  diphenoxylate-atropine (LOMOTIL) 2.5-0.025 MG tablet, Take 1 tablet by mouth daily as needed., Disp: , Rfl:  .  esomeprazole (NEXIUM) 40 MG capsule, Take 40 mg by mouth every evening. , Disp: , Rfl:  .  fexofenadine (ALLEGRA) 30 MG tablet, Take 30 mg by mouth daily as needed (allergies). , Disp: , Rfl:  .  Fluticasone Propionate (XHANCE) 93 MCG/ACT EXHU, Place 1 spray into the nose 2 (two) times daily., Disp: , Rfl:  .  Glucosamine-Chondroitin (GLUCOSAMINE CHONDR COMPLEX PO), Take 1 tablet by mouth daily., Disp: , Rfl:  .  HYDROcodone-acetaminophen (NORCO/VICODIN) 5-325 MG per tablet, Take 1-2 tablets by mouth every 6 (six) hours as needed for moderate pain., Disp: , Rfl:  .  meclizine (ANTIVERT) 25 MG tablet, Take 25 mg by mouth as needed for dizziness., Disp: , Rfl:  .  methocarbamol (ROBAXIN)  500 MG tablet, Take 1 tablet (500 mg total) by mouth every 8 (eight) hours as needed for muscle spasms., Disp: 40 tablet, Rfl: 0 .  Pirfenidone (ESBRIET) 801 MG TABS, Take 1 tablet by mouth 3 (three) times daily with meals., Disp: 90 tablet, Rfl: 12 .  rOPINIRole (REQUIP) 0.5 MG tablet, Take 0.5 mg by mouth at bedtime., Disp: , Rfl:  .  triamcinolone cream (KENALOG) 0.1 %, APPLY TO AFFECTED AREA, Disp: , Rfl: 12 .  fluticasone (FLONASE) 50 MCG/ACT nasal spray, Place 2 sprays into the nose daily as needed for allergies. , Disp: , Rfl:       Objective:   Vitals:   08/23/18 0958  BP: 120/68  Pulse: (!) 112    SpO2: 94%  Weight: 178 lb (80.7 kg)  Height: _0  (1.727 m)    Estimated body mass index is 27.06 kg/m as calculated from the following:   Height as of this encounter: _1  (1.727 m).   Weight as of this encounter: 178 lb (80.7 kg).  _2 @  [employer]   08/23/18 0958  Weight: 178 lb (80.7 kg)     Physical Exam Discussion only visit but basic exam showed crackles and otherwise stable.      Assessment:       ICD-10-CM   1. 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[place]) J84.112   2. Encounter for therapeutic drug monitoring Z51.81   3. Nausea R11.0        Plan:     Patient Instructions     ICD-10-CM   1. 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[place]) J84.112   2. Encounter for therapeutic drug monitoring Z51.81   3. Nausea R11.0    Clinically stable thought diffusion capacity appears in decline Glad you are managing with esbriet with some difficulty Glad weight loss stopped  Plan - continue esbriet as before  - check LFT 08/23/2018 - will email consent form for Galecto (inhaler), and [place] (tablet) study - continue exercise - can wear mask to [place] by plane  Followoup - alternate followupp with Dr Lauris Chroman  - Dr Lauris Chroman June 2020  =- DR [name] in [facility] clinic late august 2020/early sept 2020 Do spirometry/dlco in aug/sept 2020 Do HRCT aug/sept 2020 (will mark 2 years) Return to [facility] clinic aug/sept 2020  > 50% of this > 25 min visit spent in face to face counseling or coordination of care - by this undersigned MD - Dr Brand Males. This includes one or more of the following documented above: discussion of test results, diagnostic or treatment recommendations, prognosis, risks and benefits of management options, instructions, education, compliance or risk-factor reduction   SIGNATURE    Dr. Brand Males, M.D., F.C.C.P,  Pulmonary and Critical Care Medicine Staff Physician, [place] Director - Interstitial Lung  Disease  Program  P[facility] at [place], [place], [address]  Pager: [phone], If no answer or between  15:00h - 7:00h: call [phone] Telephone: [phone]  11:06 AM 08/23/2018

## 2018-08-23 NOTE — Progress Notes (Signed)
Patient completed pre spiro and dlco today.

## 2018-08-23 NOTE — Patient Instructions (Addendum)
ICD-10-CM   1. 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 Encounter for therapeutic drug monitoring Z51.81   3. Nausea R11.0    Clinically stable thought diffusion capacity appears in decline Glad you are managing with esbriet with some difficulty Glad weight loss stopped  Plan - continue esbriet as before  - check LFT 08/23/2018 - will email consent form for Galecto (inhaler), and [place] (tablet) study - continue exercise - can wear mask to [place] by plane  Followop - alternate followupp with Dr Lauris Chroman  - Dr Lauris Chroman June 2020  =- DR [name] in [facility] clinic late august 2020/early sept 2020 Do spirometry/dlco in aug/sept 2020 Do HRCT aug/sept 2020 (will mark 2 years) Return to [facility] clinic aug/sept 2020

## 2018-09-30 DIAGNOSIS — R001 Bradycardia, unspecified: Secondary | ICD-10-CM | POA: Diagnosis not present

## 2018-09-30 DIAGNOSIS — R55 Syncope and collapse: Secondary | ICD-10-CM | POA: Diagnosis not present

## 2018-09-30 DIAGNOSIS — J84112 Idiopathic pulmonary fibrosis: Secondary | ICD-10-CM | POA: Diagnosis not present

## 2018-09-30 DIAGNOSIS — I2789 Other specified pulmonary heart diseases: Secondary | ICD-10-CM | POA: Diagnosis not present

## 2018-09-30 DIAGNOSIS — R58 Hemorrhage, not elsewhere classified: Secondary | ICD-10-CM | POA: Diagnosis not present

## 2018-09-30 DIAGNOSIS — I959 Hypotension, unspecified: Secondary | ICD-10-CM | POA: Diagnosis not present

## 2018-09-30 DIAGNOSIS — R231 Pallor: Secondary | ICD-10-CM | POA: Diagnosis not present

## 2018-10-06 ENCOUNTER — Telehealth: Payer: Self-pay

## 2018-10-06 ENCOUNTER — Telehealth: Payer: Self-pay | Admitting: Internal Medicine

## 2018-10-06 MED ORDER — PIRFENIDONE 801 MG PO TABS: 1.0000 | ORAL_TABLET | Freq: Three times a day (TID) | ORAL | 12 refills | Status: DC

## 2018-10-06 NOTE — Telephone Encounter (Signed)
IPF-PRO Registry  Today I placed a call to Dr. Helmut Muster, Participant in the IPF-PRO Registry trial. The nature of my call was to inform the subject that he was now in window for his interval blood collection and patient reported questionnaires packet and attempted to schedule a date for the subject to come into the clinic. The subject stated due to the current events related to COVID-19 he would not be coming into the clinic just for this purpose. He stated that he was going to remain home in quarantine for the duration of the pandemic to the extent possible. The subject was thanked for his time on the call and the call was ended.    Kenneth [name]

## 2018-10-06 NOTE — Telephone Encounter (Signed)
This has been sent pharmacy is aware nothing further needed at this time.

## 2018-10-10 ENCOUNTER — Telehealth: Payer: Self-pay

## 2018-10-10 NOTE — Telephone Encounter (Signed)
Late Entry for [id number]   IPF PRO Registry Purpose: To collect data and biological samples that will support future research studies.  Registry will describe current approaches to diagnosis and treatment of IPF, analyze participant characteristics to describe the natural history of the disease, assess quality of life, describe participants interactions with the health care system, describe IPF treatment practices across multiple institutions, and utilize biological samples linked to well characterized IPF participants to identify disease biomarkers.  Clinical Research Coordinator note : This visit for [name] with DOB: 31-Dec-1940 on 10/07/2018 for the above protocol is Visit/Encounter # 18 month follow up  and is for purpose of research . The consent for this encounter is under Protocol Version February 01, 2017 and   IS currently IRB approved.  Subject expressed continued interest and consent in continuing as a study subject. Subject confirmed that there was No change in contact information (e.g. address, telephone, email). Subject thanked for participation in research and contribution to science.   In this visit 10/07/2018 the subject declined returning to the clinic for the collection of blood and PROs questionnaires due to the current active COVID-19 pandemic. Since the subject was not willing to return to clinic at this time, the study coordinator only collected data from the subject electronic medical record and recorded the information obtained in the study [facility]. All data collected from the subjects electronic medical record was used as per required per the above stated protocol. For further details regarding the subjects data collection please refer to the subjects paper source binder. The subject will return in approximately 6 months for their 24 month follow up assesments.  Signed by  [name] BS, CCRC, [place] Coordinator I [place], [place] 1:28 PM  10/10/2018   Late entry for data collected on [id number]

## 2018-12-05 DIAGNOSIS — I8312 Varicose veins of left lower extremity with inflammation: Secondary | ICD-10-CM | POA: Diagnosis not present

## 2018-12-05 DIAGNOSIS — Z85828 Personal history of other malignant neoplasm of skin: Secondary | ICD-10-CM | POA: Diagnosis not present

## 2018-12-05 DIAGNOSIS — L570 Actinic keratosis: Secondary | ICD-10-CM | POA: Diagnosis not present

## 2018-12-05 DIAGNOSIS — I8311 Varicose veins of right lower extremity with inflammation: Secondary | ICD-10-CM | POA: Diagnosis not present

## 2018-12-05 DIAGNOSIS — I872 Venous insufficiency (chronic) (peripheral): Secondary | ICD-10-CM | POA: Diagnosis not present

## 2018-12-05 DIAGNOSIS — L821 Other seborrheic keratosis: Secondary | ICD-10-CM | POA: Diagnosis not present

## 2018-12-15 ENCOUNTER — Other Ambulatory Visit: Payer: Self-pay | Admitting: Neurosurgery

## 2018-12-15 DIAGNOSIS — M48061 Spinal stenosis, lumbar region without neurogenic claudication: Secondary | ICD-10-CM

## 2018-12-19 DIAGNOSIS — K219 Gastro-esophageal reflux disease without esophagitis: Secondary | ICD-10-CM | POA: Diagnosis not present

## 2018-12-19 DIAGNOSIS — K449 Diaphragmatic hernia without obstruction or gangrene: Secondary | ICD-10-CM | POA: Diagnosis not present

## 2018-12-19 DIAGNOSIS — J849 Interstitial pulmonary disease, unspecified: Secondary | ICD-10-CM | POA: Diagnosis not present

## 2018-12-19 DIAGNOSIS — Z79899 Other long term (current) drug therapy: Secondary | ICD-10-CM | POA: Diagnosis not present

## 2018-12-19 DIAGNOSIS — R0602 Shortness of breath: Secondary | ICD-10-CM | POA: Diagnosis not present

## 2018-12-21 ENCOUNTER — Other Ambulatory Visit: Payer: Self-pay

## 2018-12-21 ENCOUNTER — Ambulatory Visit
Admission: RE | Admit: 2018-12-21 | Discharge: 2018-12-21 | Disposition: A | Discharge status: Home / Self Care | Payer: PPO | Source: Ambulatory Visit | Attending: Neurosurgery | Admitting: Neurosurgery

## 2018-12-21 ENCOUNTER — Other Ambulatory Visit: Payer: Self-pay | Admitting: Neurosurgery

## 2018-12-21 DIAGNOSIS — M48061 Spinal stenosis, lumbar region without neurogenic claudication: Secondary | ICD-10-CM

## 2018-12-21 DIAGNOSIS — M5416 Radiculopathy, lumbar region: Secondary | ICD-10-CM | POA: Diagnosis not present

## 2018-12-21 DIAGNOSIS — Z125 Encounter for screening for malignant neoplasm of prostate: Secondary | ICD-10-CM | POA: Diagnosis not present

## 2018-12-21 DIAGNOSIS — M545 Low back pain: Secondary | ICD-10-CM | POA: Diagnosis not present

## 2018-12-21 DIAGNOSIS — R7301 Impaired fasting glucose: Secondary | ICD-10-CM | POA: Diagnosis not present

## 2018-12-21 DIAGNOSIS — E7849 Other hyperlipidemia: Secondary | ICD-10-CM | POA: Diagnosis not present

## 2018-12-21 MED ORDER — IOPAMIDOL (ISOVUE-M 200) INJECTION 41%: 1.0000 mL | Freq: Once | INTRAMUSCULAR | Status: DC

## 2018-12-21 MED ORDER — METHYLPREDNISOLONE ACETATE 40 MG/ML INJ SUSP (RADIOLOG: 120.0000 mg | Freq: Once | INTRAMUSCULAR | Status: DC

## 2018-12-22 ENCOUNTER — Inpatient Hospital Stay: Admission: RE | Admit: 2018-12-22 | Payer: PPO | Source: Ambulatory Visit

## 2018-12-22 DIAGNOSIS — R82998 Other abnormal findings in urine: Secondary | ICD-10-CM | POA: Diagnosis not present

## 2018-12-26 DIAGNOSIS — R55 Syncope and collapse: Secondary | ICD-10-CM | POA: Diagnosis not present

## 2018-12-26 DIAGNOSIS — Z0000 Encounter for general adult medical examination without abnormal findings: Secondary | ICD-10-CM | POA: Diagnosis not present

## 2018-12-26 DIAGNOSIS — I272 Pulmonary hypertension, unspecified: Secondary | ICD-10-CM | POA: Diagnosis not present

## 2018-12-26 DIAGNOSIS — R7301 Impaired fasting glucose: Secondary | ICD-10-CM | POA: Diagnosis not present

## 2018-12-26 DIAGNOSIS — M48061 Spinal stenosis, lumbar region without neurogenic claudication: Secondary | ICD-10-CM | POA: Diagnosis not present

## 2018-12-26 DIAGNOSIS — J302 Other seasonal allergic rhinitis: Secondary | ICD-10-CM | POA: Diagnosis not present

## 2018-12-26 DIAGNOSIS — N529 Male erectile dysfunction, unspecified: Secondary | ICD-10-CM | POA: Diagnosis not present

## 2018-12-26 DIAGNOSIS — I2510 Atherosclerotic heart disease of native coronary artery without angina pectoris: Secondary | ICD-10-CM | POA: Diagnosis not present

## 2018-12-26 DIAGNOSIS — J84112 Idiopathic pulmonary fibrosis: Secondary | ICD-10-CM | POA: Diagnosis not present

## 2018-12-26 DIAGNOSIS — K589 Irritable bowel syndrome without diarrhea: Secondary | ICD-10-CM | POA: Diagnosis not present

## 2018-12-26 DIAGNOSIS — I712 Thoracic aortic aneurysm, without rupture: Secondary | ICD-10-CM | POA: Diagnosis not present

## 2018-12-26 DIAGNOSIS — Z1331 Encounter for screening for depression: Secondary | ICD-10-CM | POA: Diagnosis not present

## 2019-02-28 ENCOUNTER — Telehealth: Payer: Self-pay | Admitting: Internal Medicine

## 2019-02-28 NOTE — Telephone Encounter (Signed)
Has been 6 months since Dr Annamaria Boots saw [place] at clinic (last LFT and pft fev 2020). Chart reiew shows he saw Dr Lauris Chroman at [facility] in June 2020. So, if he wants to see me in Sept 2020 - please give him a 30 min slot for IPF  Thanks   SIGNATURE    Dr. Brand Males, M.D., F.C.C.P,  Pulmonary and Critical Care Medicine Staff Physician, [place] Director - Interstitial Lung Disease  Program  Pulmonary [place] at [place], [place], [address]  Pager: [phone], If no answer or between  15:00h - 7:00h: call [phone] Telephone: [phone]  6:54 PM 02/28/2019

## 2019-03-02 NOTE — Telephone Encounter (Signed)
Called and left message on pt vm to call back to schedule pft and f/u with MR -pr

## 2019-03-03 NOTE — Telephone Encounter (Signed)
Called and left message on pt vm to call back to schedule pft -pr

## 2019-03-06 ENCOUNTER — Other Ambulatory Visit: Payer: Self-pay | Admitting: Internal Medicine

## 2019-03-06 NOTE — Telephone Encounter (Signed)
Spoke to patient. Kenneth [name] scheduled patient for PFT and I scheduled COVID pre-procedure testing and gave instructions. Nothing further needed at this time.

## 2019-03-09 ENCOUNTER — Telehealth: Payer: Self-pay | Admitting: Internal Medicine

## 2019-03-10 DIAGNOSIS — Z23 Encounter for immunization: Secondary | ICD-10-CM | POA: Diagnosis not present

## 2019-03-10 NOTE — Telephone Encounter (Signed)
L/M on pt vm to return call -pr

## 2019-03-14 NOTE — Telephone Encounter (Signed)
L/m on pt vm to call back -pr

## 2019-03-16 NOTE — Telephone Encounter (Signed)
Called both home and cell - left message to return my call - pr

## 2019-03-17 NOTE — Telephone Encounter (Signed)
L/m on pt vm to call back -pr

## 2019-03-28 ENCOUNTER — Ambulatory Visit
Admission: RE | Admit: 2019-03-28 | Discharge: 2019-03-28 | Disposition: A | Discharge status: Home / Self Care | Payer: PPO | Source: Ambulatory Visit | Attending: Internal Medicine | Admitting: Internal Medicine

## 2019-03-28 DIAGNOSIS — D485 Neoplasm of uncertain behavior of skin: Secondary | ICD-10-CM | POA: Diagnosis not present

## 2019-03-28 DIAGNOSIS — Z85828 Personal history of other malignant neoplasm of skin: Secondary | ICD-10-CM | POA: Diagnosis not present

## 2019-03-28 DIAGNOSIS — R918 Other nonspecific abnormal finding of lung field: Secondary | ICD-10-CM | POA: Diagnosis not present

## 2019-03-28 DIAGNOSIS — C44319 Basal cell carcinoma of skin of other parts of face: Secondary | ICD-10-CM | POA: Diagnosis not present

## 2019-03-28 DIAGNOSIS — J84112 Idiopathic pulmonary fibrosis: Secondary | ICD-10-CM

## 2019-03-28 DIAGNOSIS — L821 Other seborrheic keratosis: Secondary | ICD-10-CM | POA: Diagnosis not present

## 2019-03-28 DIAGNOSIS — L218 Other seborrheic dermatitis: Secondary | ICD-10-CM | POA: Diagnosis not present

## 2019-03-31 ENCOUNTER — Other Ambulatory Visit (HOSPITAL_COMMUNITY): Admission: RE | Admit: 2019-03-31 | Payer: PPO | Source: Ambulatory Visit

## 2019-04-03 ENCOUNTER — Ambulatory Visit: Payer: PPO | Admitting: Internal Medicine

## 2019-04-17 ENCOUNTER — Other Ambulatory Visit (HOSPITAL_COMMUNITY)
Admission: RE | Admit: 2019-04-17 | Discharge: 2019-04-17 | Disposition: A | Discharge status: Home / Self Care | Payer: PPO | Source: Ambulatory Visit | Attending: Internal Medicine | Admitting: Internal Medicine

## 2019-04-17 DIAGNOSIS — Z20828 Contact with and (suspected) exposure to other viral communicable diseases: Secondary | ICD-10-CM | POA: Diagnosis not present

## 2019-04-17 DIAGNOSIS — Z01812 Encounter for preprocedural laboratory examination: Secondary | ICD-10-CM | POA: Diagnosis not present

## 2019-04-17 LAB — SARS CORONAVIRUS 2 (TAT 6-24 HRS): SARS Coronavirus 2: NEGATIVE

## 2019-04-19 DIAGNOSIS — C44319 Basal cell carcinoma of skin of other parts of face: Secondary | ICD-10-CM | POA: Diagnosis not present

## 2019-04-19 DIAGNOSIS — Z85828 Personal history of other malignant neoplasm of skin: Secondary | ICD-10-CM | POA: Diagnosis not present

## 2019-04-21 ENCOUNTER — Other Ambulatory Visit: Payer: Self-pay

## 2019-04-21 ENCOUNTER — Other Ambulatory Visit: Payer: Self-pay | Admitting: Internal Medicine

## 2019-04-21 ENCOUNTER — Ambulatory Visit (INDEPENDENT_AMBULATORY_CARE_PROVIDER_SITE_OTHER): Payer: PPO | Admitting: Internal Medicine

## 2019-04-21 ENCOUNTER — Encounter: Payer: PPO | Admitting: Internal Medicine

## 2019-04-21 ENCOUNTER — Encounter: Payer: Self-pay | Admitting: Internal Medicine

## 2019-04-21 VITALS — BP 120/74 | HR 81 | Temp 97.0°F | Ht 69.0 in | Wt 170.0 lb

## 2019-04-21 DIAGNOSIS — E559 Vitamin D deficiency, unspecified: Secondary | ICD-10-CM | POA: Diagnosis not present

## 2019-04-21 DIAGNOSIS — J84112 Idiopathic pulmonary fibrosis: Secondary | ICD-10-CM

## 2019-04-21 DIAGNOSIS — Z5181 Encounter for therapeutic drug level monitoring: Secondary | ICD-10-CM

## 2019-04-21 DIAGNOSIS — R110 Nausea: Secondary | ICD-10-CM

## 2019-04-21 DIAGNOSIS — R634 Abnormal weight loss: Secondary | ICD-10-CM | POA: Diagnosis not present

## 2019-04-21 LAB — HEPATIC FUNCTION PANEL
ALT: 19 U/L (ref 0–53)
AST: 19 U/L (ref 0–37)
Albumin: 4.5 g/dL (ref 3.5–5.2)
Alkaline Phosphatase: 71 U/L (ref 39–117)
Bilirubin, Direct: 0.1 mg/dL (ref 0.0–0.3)
Total Bilirubin: 0.4 mg/dL (ref 0.2–1.2)
Total Protein: 7 g/dL (ref 6.0–8.3)

## 2019-04-21 LAB — PULMONARY FUNCTION TEST
DL/VA % pred: 109 %
DL/VA: 4.34 ml/min/mmHg/L
DLCO unc % pred: 86 %
DLCO unc: 20.89 ml/min/mmHg
FEF 25-75 Pre: 3.19 L/sec
FEF2575-%Pred-Pre: 157 %
FEV1-%Pred-Pre: 102 %
FEV1-Pre: 2.92 L
FEV1FVC-%Pred-Pre: 112 %
FEV6-%Pred-Pre: 96 %
FEV6-Pre: 3.59 L
FEV6FVC-%Pred-Pre: 106 %
FVC-%Pred-Pre: 90 %
FVC-Pre: 3.59 L
Pre FEV1/FVC ratio: 81 %
Pre FEV6/FVC Ratio: 100 %

## 2019-04-21 NOTE — Progress Notes (Signed)
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 He tells me that this summer 2018*noticing insidious onset of shortness of breath particularly with walking stairs in the house or [place]. This was not that in the previous years. The summer 2018 he was in [place] but did not notice much shortness of breath. Otherwise he is able to do his activities of daily living and played golf with a car. He is more bothered by his back pain and sciatica as a result of previous back surgery. Shortness of breath is extremely mild. He says he became more aware of it after the radiologic investigations documented below. He had a chest x-ray that suggested interstitial findings and therefore he underwent a high-resolution CT scan of the chest that is described below. I personally visualized this high resolution CT chest and to me it shows bilateral bibasal subpleural reticulation that also extends to the upper lobes without any zonal predominance. There is no obvious honeycombing but there is traction bronchiectasis. There no mediastinal adenopathy. Therefore he is been referred here. In terms of exposure history other than radiation exposure he has not been on any pulmonary toxic drugs or any mold exposure or asbestos exposure. Does have GERD and is on PPI and is well ocntrolled   Does have spring and perennial allergies - sneezes easy. Allergy test with [facility] negative some years ago  [employer] chest physicians interstitial lung disease questionnaire: He says that he does not cough. He is only trouble with dyspnea with strenuous exercise and it started 4 months ago. Past medical history significant for acid reflux. He has never smoked any tobacco or street drugs. Does not have any family history of lung disease.  At home he does not have any humidifier sound birds heart the water damage or mold. 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 He has standard radiation exposure while at work. The standard radiation for a radiologist. He does not take any pulmonary toxic drugs. House is 77 years old and he worries about mold. He sneezes if air is forced   Pulmonary function test shows isolated reduction in diffusion capacity to 62%. Walking desaturation test underneath her feet 3 laps on room air: Resting pulse ox 96%. Final pulse ox 95%. Heart rate resting was 92/m and went up to 109 minute.   Results for KALIF, KATTNER (MRN [id number]) as of 03/23/2017 10:04  Ref. Range 03/04/2017 09:13  FVC-Pre Latest Units: L 3.59  FVC-%Pred-Pre Latest Units: % 86  Results for ROAN, MIKLOS (MRN [id number]) as of 03/23/2017 10:04  Ref. Range 03/04/2017 09:13  DLCO cor Latest Units: ml/min/mmHg 20.06  DLCO cor % pred Latest Units: % 63    Lungs/Pleura: There is subpleural reticulation with scattered ground-glass and traction bronchiolectasis, without a definite zonal predominance. No definitive honeycombing. Findings appear mildly progressive when radiographs dating back to 12/28/2008 are reviewed. Probable 5 mm subpleural lymph node along the right major fissure. No pleural fluid. Airway is unremarkable.      IMPRESSION: 1. Pulmonary parenchymal findings of interstitial lung disease which may be due to fibrotic nonspecific interstitial pneumonitis or, given slight progression over time, usual interstitial pneumonitis. 2. Aortic atherosclerosis (ICD10-170.0). Coronary artery calcification. 3. Aortic aneurysm NOS (  ICD10-I71.9). 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 Recommend annual imaging followup by CTA or MRA. This recommendation follows 2010 ACCF/AHA/AATS/ACR/ASA/SCA/SCAI/SIR/STS/SVM Guidelines for the Diagnosis and Management of Patients with Thoracic  Aortic Disease. Circulation. 2010; 121: W295-A213. 4. Prominence of the right and left main pulmonary arteries can be seen with pulmonary arterial hypertension.   Electronically Signed   By: [name]   On: 02/17/2017 13:41     OV 03/23/2017  Chief Complaint  Patient presents with   Follow-up    SOB on exertion. Other than that he has been doing about the same. Denies any cough or CP.   Here to discuss results. [name]. 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 In inteirm no new symptoms.  Autoimmune profile negative. They raise possibility of 2nd opinion.  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 Progression is based on CXR comparison since 2010; there is no CT and is only suspicion of progression  Results for LORENZO, ARSCOTT (MRN [id number]) as of 03/23/2017 10:04  Ref. Range 12/08/2011 16:44 03/04/2017 13:47  Anit Nuclear Antibody(ANA) Latest Ref Range: NEGATIVE   NEG  Angiotensin-Converting Enzyme Latest Ref Range: 9 - 67 U/L  31  Cyclic Citrullin Peptide Ab Latest Units: Units  <16  ds DNA Ab Latest Units: IU/mL  <1  ENA RNP Ab Latest Ref Range: 0.0 - 0.9 AI  0.2  Endomysial Screen Latest Ref Range: NEGATIVE  NEGATIVE   Deamidated Gliadin Abs, IgG Latest Ref Range: <20 U/mL 4.1   Gliadin IgA Latest Ref Range: <20 U/mL 1.8   RA Latex Turbid. Latest Ref Range: <14 IU/mL  <14  Tissue Transglut Ab Latest Ref Range: <20 U/mL 3.9   Tissue Transglutaminase Ab, IgA Latest Ref Range: <20 U/mL 2.2   IgE (Immunoglobulin E), Serum Latest Ref Range: <115 kU/L  31  IgA Latest Ref Range: 68 - 379 mg/dL 132   SSA (Ro) (ENA) Antibody, IgG Latest Ref Range: <1.0 NEG AI   <1.0 NEG  SSB (La) (ENA) Antibody, IgG Latest Ref Range: <1.0 NEG AI   <1.0 NEG    OV 05/06/2017  Chief Complaint  Patient presents with   Follow-up    Follow up today after beginning Esbriet. Pt has no c/o SOB,  cough, or CP. Has had some mild nausea from the [place] but other than that, he has been doing good on it.   ollow-up idiopathic pulmonary fibrosis mild severity  He is here to follow-up He is on the third week of his bed at 3 pills 3 times a day max dose. So far is tolerating it fine other than mild nausea. He does take after meals. He plans to meet with [facility] coordinator for medication support. He did visit [place] this past weekend and did have vomiting especially after consuming few etoh drinks at a [employer]. He does apply sunscreen [his daughter is a Paediatric nurse in [place]. He still complains about the fact that and is 78 year old home when the air conditioner comes on and they air blows out of the duct system he does sneeze 10 times and his feet does feel cold. She plans to put hepa filters that are high and and also get the duct cleaning. He says he did have allergy evaluation and found few years ago and it was negative. Recently he has increased his rpinrole and alsogot an injection for his  back and he does feel better   OV 06/22/2017  Chief Complaint  Patient presents with   Follow-up    Pt still taking Esbriet and has been doing good except has been having issues with nausea x3 days. PFT done today. Still becomes SOB with exertion. Denies any complaints of cough or CP.   IPF followup  Routine followup. Now on esbriet 3 pills tid since end oct 2018. Having new nausea - moderate, x 3 days. Might be chills. But no associated diarrhea or other side effects. Spacing esbriet 4h only. Also has complaitns about high co pay. Unable to find charity. Doing exrcise with trainer - feeels that is better and more vigorous than rehab. Gets tachy with eercise but says he does not desaturate. Had PFT - stable. Lot of questions about disease    Walking desaturation test on 06/22/2017 185 feet x 3 laps on ROOM AIR:  did NOT desaturate. Rest pulse ox was 100%, final pulse ox was 99%. HR  response 84/min at rest to 97/min at peak exertion. Patient TOM MACPHERSON  Did not Desaturate < 88% . Kenneth [name] did not  Desaturated </= 3% points. Marylyn Ishihara A Kleckner yes did get tachyardic   OV 09/20/2017  Chief Complaint  Patient presents with   Follow-up    PFT done today.  Pt states he has been doing good. Pt still taking Esbriet and states he is doing good on it.   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  Joen Laura for IPF follow-up.  Last visit December 2018.  Since then he has been compliant fully with his Pirfenidone (Esbriet).  His nausea resolved with Ginger intake.  However he is having 3-5 pound weight loss and also fatigue towards the end of the day and also low appetite.  He is exercising vigorously 5 times a week and he is wondering if the fatigue could be because of the heavy exercise.  He is not interested in lung transplantation.  He is open to research participation in interventional trial.  Currently he is on the IPF registry program.  Overall he is well.  He did go to [place] to visit his son and he did not wear sunscreen and he did pick up a stage I skin burn with erythema that was more than usual.  But this resolved.  I did remind him about his obligation to wear sunscreen at all times with Pirfenidone (Esbriet).  He is interested in interventional research trials.  His pulmonary function test to me on an average is stable even though there is decline in FVC that seems to be an increase in DLCO so that is variability.  His walking desaturation test is around the same. He is interested in rolling over to the larger capsule of the Pirfenidone (Esbriet) so we will have to take it only 3 times a day. ESberit is cposting him $500/month or more due to high AGI and this is frustrating for him   Walking desaturation test on 09/20/2017 185 feet x 3 laps on ROOM AIR:  did walk briskly all 3 laps with only mild dyspnea desaturate. Rest pulse ox was 98%, final pulse ox was 98%. HR response 96/min at rest to  107/min at peak exertion. Patient LEXIE MORINI  Did not Desaturate < 88% . Kenneth [name] did not  Desaturated </= 3% points. Guillermina [place] Goldinger yes did get tachyardic this appears similar to a few months ago   OV 12/07/2017  Chief Complaint  Patient presents with  Follow-up    Pt had pre Arlyce Harman and DLCO prior to OV today. Pt has increase of SOB with exertion more with walking up the hill.   Dr. Annamaria Boots presents for follow-up of his IPF to ILD clinic.  In terms of his IPF he feels stable.  In fact he tells me that if not for the incidental chest x-ray and a subsequent CT scan that picked up pulmonary fibrosis he even to this day will not know that he has IPF.  He only has a mild exertional dyspnea class I which she always believes in the past that it was because of aging.  At this point in time he is on Esbriet 1 big pill 3 times daily at full dose.  He is now applying sunscreen and has not had any further sunburns.  He is following an extremely intense exercise fitness regimen to improve his cardiac conditioning.  He believes his exertional heart rate and resting heart rate are much improved than before.  He works with a Clinical research associate at [employer].  His main issue is from Pirfenidone [facility]).  He had some mild nausea that has now resolved with ginger capsules but he continues to have some amount of fatigue low appetite and some mild weight loss.  He says this is mild and all tolerable.  The fatigue happens after working a lot in the yard and he loves yard work.  He wants to know if he can continue to do yard work.  Recently he did have some sinus congestion and took some steroids and antibiotics for it and he feels better but he still has some sinus fullness.  He had spirometry and simple walking desaturation test and the profile is below and I believe these are stable.  In fact his exertional heart rate is improved compared to previous.  Today he also participated in the IPF registry program.  There is a  noninterventional trial.  He is interested in future interventional trials.   OV 03/08/2018  Chief Complaint  Patient presents with   Follow-up    PFT performed today. Pt states he has been doing well except he has been having problems with nausea and dizziness, and loss of appetite.   Kenneth [name] - presents for follow-up. For his IPF. He is on esbruet now. He was tolerating his Pirfenidone (Esbriet) quite well other than minor side effects till last visit. However in thesummer of 2019e's had increased nauseaand also weight loss. He has lost 6 pounds since prior visit. Overall he is lost 11 pounds since September 2018. We started the medication esbeit  anti-fibrotic for him in October 2018. His pants are getting looser.he is also having significant fatigue. Nevertheless he does not want to switch to the of anti-fibrotic ofev because of prior history of irritable bowel syndrome. He says that his baseline irritable bowel syndrome is worse than any side effects he is having with esbriet especially the fatigue.e is open to a transplant evaluation and consideration of research protocols. His spirometry shows stability with FVC but his DLCO might be declining.Marland Kitchen His simple walk test did not show anychange. He is not noticing any subjective changes in dyspnea on effort tolerance while working out although he does admit to increased dizziness particularly when playing golf. During this time recently has not monitored his pulse ox but in the past and never went below 92%.  Other issues: He had some myalgia despite change in his statin. He will address this with his  primary care. 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 Primary care has ordered a follow-up CT scan.This can be combined with a high risk CT scan. I I have reached out to  the thoracic radiology     OV 05/10/2018  Subjective:  Patient ID: Kenneth [name], male , DOB: January 13, 1941 , age 59 y.o. , MRN: [id number] , ADDRESS: [place] [id number]   05/10/2018 -   Chief Complaint  Patient presents with   Follow-up    review spiro with dlco.  c/o stable doe.  On 829m Esbriet TID, notes occ dizziness, loss of appetite.      HPI KYUTO CAJUSTE89y.o. -returns-year follow-up.  Diagnosis made towards the end of 2018.  He has been on Esbriet since then.  His main issues have been weight loss.  He lost 10 pounds of weight at the time of last visit.  At the last visit he weighed 174 pounds in September 2019.  Back in August 2018 he weighed 186 pounds.  All this weight loss happen after he started Pirfenidone (Esbriet).  However he is now stabilized and he continues to be 874 pounds.  He is able to do activities of daily living and exercise but when he does stairs he gets dyspneic.  Overall he feels stable in the last 1 year in terms of his effort tolerance.  His main thing is that he has no appetite.  He has been communicating with the Pirfenidone (Esbriet) supporting about how to manage his low appetite and occasional nausea.  We discussed the options about taking brief holidays or reduction in dose.  He is willing to try this.  He does take occasional ginger.  Of note he [facility] because he was out of network.  He tells me that his insurance company only advised that he would have a higher co-pay so is a little bit puzzled by this.  Since then he said conversation with his primary care physician and he wants to see Dr. DShana Chutewho is a son-in-law to his friend Dr. BLindwood Quaretired sPsychologist, sport and exercise  I know Dr. GLauris Chromanquite well from an IPF standpoint.  And therefore wholeheartedly supported the idea.  In terms of his lung function testing his FVC shows a decline although he is feeling the same.  On miniature walking test he seems a little more tachycardic but he says he feels the same.  He manages this heart rate with his exercise.  His  DLCO with some variability appears stable.  He had a CT angiogram for thoracic aortic aneurysm in September 2019 and this when compared to one year earlier has been reported as stable but the different resolutions.  I offered to do a high-resolution CT chest here but he wants to hold off until he saw Dr. GLauris Chroman  OV 08/23/2018  Subjective:  Patient ID: KWestley [name] male , DOB: 4Sep 30, 1942, age 78y.o. , MRN: [id number], [place] [id number]  08/23/2018 -   Chief Complaint  Patient presents with   Follow-up    PFT performed today. Pt states he has been doing well since last visit and denies any complaints.   IPF followup"   Diagnosis made towards the end of 2018.  He has been on Esbriet since then.   HPI KJASHAD DEPAULA89y.o. -Dr.  Ronny Flurry presents for follow-up of his IPF.  He continues to feel good.  His main issue is tolerating the Esbriet which gives some fatigue.  Sometimes he has to postpone it.  In terms of his dyspnea he continues to exercise.  His dyspnea symptom score is really minimal.  His cough is nonexistent.  He was losing weight with Esbriet but now he is gained his weight back.  In the interim he did see Dr. Shana Chute June 23, 2018 at [facility].  He had a satisfactory visit.  He thought he was stable.  His next visit with Dr. Lauris Chroman will be in June 2020.  Moving forward he wants to alternate every 6 months between myself and Dr. Lauris Chroman at [facility] which will give him 3 months with 1 pulmonologist or the other.  He is beginning to get interested in research studies.  We did pulmonary function test on him today and is documented below.  The FVC appears a fluctuant but the DLCO definitely appears on a downward trend.  The new DLCO is on a global lung initiated [GL I] equation and therefore the percent predicted is higher.  But the absolute value showed downward trend line.  We went over this.  He definitely does not feel this subjectively.  His  last high-resolution CT scan of the chest was in August of 2018.  He is willing to have one at follow-up.  He wants to see me again in 6 months.  His simple walking desaturation test appears stable.  He is thinking of making a trip to [place] and wants to know if he should wear a mask.    OV 04/21/2019  Subjective:  Patient ID: Kenneth [name], male , DOB: Oct 22, 1940 , age 65 y.o. , MRN: [id number] , ADDRESS: [place] [id number]   04/21/2019 -   Chief Complaint  Patient presents with   Follow-up    Patient reports that his breathing is doing well at this time.    IPF followup. On esbriet  HPI LAQUINTON BIHM 78 y.o. -presents for follow-up of his idiopathic pulmonary fibrosis.  Last visit was pre-pandemic in February 2018.  In the interim he did see Dr. Shana Chute at [facility] and deemed to be stable.  At this point in time he is reporting continued stability with the symptoms as seen by the score below.  However I did notice that he is lost lot of weight.  His BMI with his clothes on is 25 with a weight of 170 pounds.  He says his dry weight is actually around 162 pounds.  He feels that this is the stable weight in the last 6 months.  He says he is not bothered by it.  He says this is because of low appetite because of the and pirfenidone.  He feels the pirfenidone is benefiting him and he continues to be is to be stable.  In fact his pulmonary function test shows " improvement".  He said that he felt the test today was somewhat variable and is effort based on the technician.  He had a recent high-resolution CT chest that shows continued stability.  I personally visualized this film.  At this point in time he is happy taking the pirfenidone.  But he did agree if there is further weight loss into the 150s pounds then we could reassess  Had a lot of discussion about COVID-19 and about appropriate risk reduction measures which he is continuing to  do.  He did some read some  literature about COVID-19 and had questions about it. He in Vit D supplementation and is agreeable to get his levels checked following literature about protective effect of Vit D  Other than that he reports 2 episodes of dizziness and in one of those he nearly passed out.  He was wondering if some of the symptoms were related to ropinirole and therefore he has stopped it.  I reviewed the literature with him and the significant amount of dizziness and hypotension.  And also GI symptoms which can probably accentuated with pirfenidone.    SYMPTOM SCALE - ILD 08/23/2018  04/21/2019   O2 use ra e  Shortness of Breath 0 -> 5 scale with 5 being worst (score 6 If unable to do)   At rest 0 0  Simple tasks - showers, clothes change, eating, shaving *0** 0  Household (dishes, doing bed, laundry) 0 0  Shopping 0 0  Walking level at own pace 0 0  Walking keeping up with others of same age 23 0  Walking up Stairs 2 2  Walking up Hill 2 2  Total (40 - 48) Dyspnea Score 4 4  How bad is your cough? 0 0  How bad is your fatigue 1 due to esbriet 0       Results for TYGER, WICHMAN (MRN [id number]) as of 08/23/2018 10:33  Ref. Range 09/20/2017 08:54 12/07/2017 13:49 03/08/2018 10:02 05/10/2018 13:59 06/20/2018 [facility] 08/23/2018 09:29 04/21/2019   FVC-Pre Latest Units: L 3.47 3.54 3.56 3.27 3.55 3.19 3.59  FVC-%Pred-Pre Latest Units: % 84 86 86 84  82 90%  Results for PHILOPATER, MUCHA (MRN [id number]) as of 08/23/2018 10:33  Ref. Range 09/20/2017 08:54 12/07/2017 13:49 03/08/2018 10:02 05/10/2018 13:59 06/20/18 [facility] 08/23/2018 09:29 - GLI equation 04/21/2019 - GLI equation  DLCO unc Latest Units: ml/min/mmHg 24.06 21.20 19.63 19.04 19.06 18.61 20.89  DLCO unc % pred Latest Units: % 76 66 62 64  79 86%    Simple office walk 185 feet x  3 laps goal with forehead probe 12/07/2017 Weight 180# 03/08/2018 Weight 174.9 05/10/2018 Weight 174# 08/23/2018 Weight 178# 04/21/2019 Weight 170#  O2 used Room air Room air Room air Room  air Room air  Number laps completed _0 Comments about pace Moderate pace Mod pace Mod fast pace fast   Resting Pulse Ox/HR 100% and 83/min 100% ad 85/min 100% and 91/min 100% and 82/min   Final Pulse Ox/HR 99% and 98/min 98$ and 104/min 98% and 108/min 98% and 100/min   Desaturated </= 88% no no no no   Desaturated <= 3% points no no ni no   Got Tachycardic >/= 90/min yes yes yes yes   Symptoms at end of test No complaints No complaints  No complaints   Miscellaneous comments x ? sligh increase in tachy/stable ? incraeased tachty      ROS - per HPIIMPRESSION: 1. The appearance of the lungs is compatible with interstitial lung disease, with a spectrum of findings considered probable usual interstitial pneumonia (UIP) per current ATS guidelines. Findings are centrally stable compared to the prior examination. 2. Aortic atherosclerosis, in addition to 2 vessel coronary artery disease. Assessment for potential risk factor modification, dietary therapy or pharmacologic therapy may be warranted, if clinically indicated. 3. There are calcifications of the aortic valve and mitral annulus. Echocardiographic correlation for evaluation of potential valvular dysfunction may be warranted if clinically indicated.  Aortic Atherosclerosis (  ICD10-I70.0).  Electronically Signed: By: Vinnie Langton M.D. On: 03/28/2019 16:00     has a past medical history of Arthritis, Diverticulosis of colon (without mention of hemorrhage), GERD (gastroesophageal reflux disease), Hiatal hernia, HLD (hyperlipidemia), Internal hemorrhoids without mention of complication, Irritable bowel syndrome, Other specified gastritis without mention of hemorrhage, and Pulmonary fibrosis ([place]).   reports that he has never smoked. He has never used smokeless tobacco.  Past Surgical History:  Procedure Laterality Date   CARPAL TUNNEL RELEASE     left   CATARACT EXTRACTION  06/2011   bilateral   LUMBAR [place]   x 2   LUMBAR LAMINECTOMY/DECOMPRESSION MICRODISCECTOMY Left 07/11/2013   Procedure: LUMBAR ONE TO TWO, LUMBAR TWO TO THREE, LUMBAR THREE TO FOUR LUMBAR LAMINECTOMY/DECOMPRESSION MICRODISCECTOMY 3 LEVELS;  Surgeon: Charlie Pitter, MD;  Location: [facility];  Service: Neurosurgery;  Laterality: Left;    No Known Allergies  Immunization History  Administered Date(s) Administered   Influenza, High Dose Seasonal PF 03/06/2017, 04/09/2018, 03/07/2019   Influenza,inj,Quad PF,6+ Mos 04/05/2016   Influenza-Unspecified 04/05/2016   Pneumococcal Conjugate-13 04/26/2018   Pneumococcal-Unspecified 04/05/2016   Zoster Recombinat ([facility]) 01/03/2017    Family History  Problem Relation Age of Onset   Heart failure Father 35   Colon cancer Neg Hx    Pancreatic cancer Neg Hx    Rectal cancer Neg Hx    Stomach cancer Neg Hx      Current Outpatient Medications:    acetaminophen-codeine (TYLENOL #3) 300-30 MG per tablet, Take 1 tablet by mouth every 8 (eight) hours as needed for moderate pain. , Disp: , Rfl:    carisoprodol (SOMA) 350 MG tablet, Take 175 mg by mouth 3 (three) times daily as needed for muscle spasms. , Disp: , Rfl:    Cholecalciferol (VITAMIN D-3) 1000 UNITS CAPS, Take 1,000 Units by mouth daily., Disp: , Rfl:    CO-ENZYME Q-10 PO, Take 1 tablet by mouth daily., Disp: , Rfl:    diclofenac (VOLTAREN) 75 MG EC tablet, Take 75 mg by mouth 2 (two) times daily., Disp: , Rfl: 2   diclofenac sodium (VOLTAREN) 1 % GEL, APPLY 4 GM UP TO 4 TIMES A DAY FOR KNEE ARTHRITIS, Disp: , Rfl: 12   dicyclomine (BENTYL) 10 MG capsule, Take one by mouth every 6 hours as needed for abdominal cramping, Disp: 30 capsule, Rfl: 1   diphenoxylate-atropine (LOMOTIL) 2.5-0.025 MG tablet, Take 1 tablet by mouth daily as needed., Disp: , Rfl:    esomeprazole (NEXIUM) 40 MG capsule, Take 40 mg by mouth every evening. , Disp: , Rfl:    fexofenadine (ALLEGRA) 30 MG tablet,  Take 30 mg by mouth daily as needed (allergies). , Disp: , Rfl:    Fluticasone Propionate (XHANCE) 93 MCG/ACT EXHU, Place 1 spray into the nose 2 (two) times daily., Disp: , Rfl:    Glucosamine-Chondroitin (GLUCOSAMINE CHONDR COMPLEX PO), Take 1 tablet by mouth daily., Disp: , Rfl:    HYDROcodone-acetaminophen (NORCO/VICODIN) 5-325 MG per tablet, Take 1-2 tablets by mouth every 6 (six) hours as needed for moderate pain., Disp: , Rfl:    meclizine (ANTIVERT) 25 MG tablet, Take 25 mg by mouth as needed for dizziness., Disp: , Rfl:    methocarbamol (ROBAXIN) 500 MG tablet, Take 1 tablet (500 mg total) by mouth every 8 (eight) hours as needed for muscle spasms., Disp: 40 tablet, Rfl: 0   Pirfenidone (ESBRIET) 801 MG TABS, Take 1 tablet by mouth 3 (  three) times daily with meals., Disp: 90 tablet, Rfl: 12   rOPINIRole (REQUIP) 0.5 MG tablet, Take 0.5 mg by mouth at bedtime., Disp: , Rfl:    triamcinolone cream (KENALOG) 0.1 %, APPLY TO AFFECTED AREA, Disp: , Rfl: 12      Objective:   Vitals:   04/21/19 1150  BP: 120/74  Pulse: 81  Temp: (!) 97 F (36.1 C)  TempSrc: Temporal  SpO2: 96%  Weight: 170 lb (77.1 kg)  Height: _0  (1.753 m)    Estimated body mass index is 25.1 kg/m as calculated from the following:   Height as of this encounter: _1  (1.753 m).   Weight as of this encounter: 170 lb (77.1 kg).  _2 @  [employer]   04/21/19 1150  Weight: 170 lb (77.1 kg)     Physical Exam  General Appearance:    Alert, cooperative, no distress, appears stated age - yes , Deconditioned looking - no , OBESE  - no, Sitting on Wheelchair -  no  Head:    Normocephalic, without obvious abnormality, atraumatic  Eyes:    PERRL, conjunctiva/corneas clear,  Ears:    Normal TM's and external ear canals, both ears  Nose:   Nares normal, septum midline, mucosa normal, no drainage    or sinus tenderness. OXYGEN ON  - no . Patient is @ ra   Throat:   Lips, mucosa, and tongue  normal; teeth and gums normal. Cyanosis on lips - no  Neck:   Supple, symmetrical, trachea midline, no adenopathy;    thyroid:  no enlargement/tenderness/nodules; no carotid   bruit or JVD  Back:     Symmetric, no curvature, ROM normal, no CVA tenderness  Lungs:     Distress - no , Wheeze no, Barrell Chest - no, Purse lip breathing - no, Crackles - just at base   Chest Wall:    No tenderness or deformity.    Heart:    Regular rate and rhythm, S1 and S2 normal, no rub   or gallop, Murmur - no  Breast Exam:    NOT DONE  Abdomen:     Soft, non-tender, bowel sounds active all four quadrants,    no masses, no organomegaly. Visceral obesity - no   Genitalia:   NOT DONE  Rectal:   NOT DONE  Extremities:   Extremities - normal, Has Cane - no, Clubbing - no, Edema - no  Pulses:   2+ and symmetric all extremities  Skin:   Stigmata of Connective Tissue Disease - no  Lymph nodes:   Cervical, supraclavicular, and axillary nodes normal  Psychiatric:  Neurologic:   Pleasant - yes, Anxious - no, Flat affect - no  CAm-ICU - neg, Alert and Oriented x 3 - yes, Moves all 4s - yes, Speech - normal, Cognition - intact           Assessment:       ICD-10-CM   1. 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HCC)  J84.112 Hepatic function panel    Hepatic function panel  2. Encounter for therapeutic drug monitoring  Z51.81   3. Drug-induced weight loss  R63.4    T50.905A   4. Vitamin D deficiency  E55.9 Vitamin D 1,25 dihydroxy    Vitamin D 1,25 dihydroxy  5. 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[place]) - Plan: Hepatic function panel  - stable  - continue esbriet  as before - meet with research team for IPF-PRO registry  Encounter for therapeutic drug monitoring - check LFT 04/21/2019   Nausea - spring and summer 2020 - requip could have played a role esp with the low bp episode in march 2020   Drug-induced weight loss  - esbriet likely  playing a role but we agreed that your current weight is acceptabl - will continue to monitor   Vitamin D deficiency  - check vitamin-d level  COVID-19 risk  - live in the low, low-moderate risk zone and be judicious about moderate risk  - avoid moderate-high and high risk activities  - monitor prevalence of positive states in the county/state - if </= 5% ok.   - If >/= 7% (or)  > 10%  - be more risk wary   Followup  = 6 months; ILD symptom score and simple walk test    > 50% of this > 40 min visit spent in face to face counseling or/and coordination of care - by this undersigned MD - Dr Brand Males. This includes one or more of the following documented above: discussion of test results, diagnostic or treatment recommendations, prognosis, risks and benefits of management options, instructions, education, compliance or risk-factor reduction   SIGNATURE    Dr. Brand Males, M.D., F.C.C.P,  Pulmonary and Critical Care Medicine Staff Physician, [place] Director - Interstitial Lung Disease  Program  [facility] [place], [place], [address]  Pager: [phone], If no answer or between  15:00h - 7:00h: call [phone] Telephone: [phone]  2:54 PM 04/21/2019

## 2019-04-21 NOTE — Patient Instructions (Addendum)
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[place]) - Plan: Hepatic function panel  - stable  - continue esbriet as before - meet with research team for IPF-PRO registry  Encounter for therapeutic drug monitoring - check LFT 04/21/2019   Nausea - spring and summer 2020 - requip could have played a role esp with the low bp episode in march 2020   Drug-induced weight loss  - esbriet likely playing a role but we agreed that your current weight is acceptabl - will continue to monitor   Vitamin D deficiency  - check vitamin-d level  COVID-19 risk  - live in the low, low-moderate risk zone and be judicious about moderate risk  - avoid moderate-high and high risk activities  - monitor prevalence of positive states in the county/state - if </= 5% ok.   - If >/= 7% (or)  > 10%  - be more risk wary   Followup  = 6 months; ILD symptom score and simple walk test

## 2019-04-21 NOTE — Progress Notes (Signed)
Spiro/DLCO performed today.

## 2019-04-21 NOTE — Research (Signed)
IPF PRO Registry Purpose: To collect data and biological samples that will support future research studies.  Registry will describe current approaches to diagnosis and treatment of IPF, analyze participant characteristics to describe the natural history of the disease, assess quality of life, describe participants interactions with the health care system, describe IPF treatment practices across multiple institutions, and utilize biological samples linked to well characterized IPF participants to identify disease biomarkers.   Clinical Research Coordinator / Research RN note : This visit for [name] with DOB: 1940/12/22 on 04/21/2019 for the above protocol is Visit/Encounter # 24 month and is for purpose of research. Subject expressed continued interest and consent in continuing as a study subject. Subject confirmed that there was no change in contact information (e.g. address, telephone, email). Subject thanked for participation in research and contribution to science. All procedures completed per the above mentioned study refer to the subject's paper source binder for further details.  Signed by [place] Assistant [facility]  [place], [place] 11:22 AM 04/21/2019

## 2019-04-24 ENCOUNTER — Encounter: Payer: Self-pay | Admitting: Internal Medicine

## 2019-04-26 LAB — VITAMIN D 1,25 DIHYDROXY
Vitamin D 1, 25 (OH)2 Total: 51 pg/mL (ref 18–72)
Vitamin D2 1, 25 (OH)2: <8 pg/mL
Vitamin D3 1, 25 (OH)2: 51 pg/mL

## 2019-05-02 ENCOUNTER — Encounter: Payer: Self-pay | Admitting: *Deleted

## 2019-05-02 NOTE — Progress Notes (Signed)
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 Closed encounter.

## 2019-06-05 DIAGNOSIS — H5212 Myopia, left eye: Secondary | ICD-10-CM | POA: Diagnosis not present

## 2019-06-05 DIAGNOSIS — Z961 Presence of intraocular lens: Secondary | ICD-10-CM | POA: Diagnosis not present

## 2019-06-05 DIAGNOSIS — H524 Presbyopia: Secondary | ICD-10-CM | POA: Diagnosis not present

## 2019-06-14 DIAGNOSIS — Z1331 Encounter for screening for depression: Secondary | ICD-10-CM | POA: Diagnosis not present

## 2019-07-10 DIAGNOSIS — R0602 Shortness of breath: Secondary | ICD-10-CM | POA: Diagnosis not present

## 2019-07-10 DIAGNOSIS — Z23 Encounter for immunization: Secondary | ICD-10-CM | POA: Diagnosis not present

## 2019-07-10 DIAGNOSIS — J849 Interstitial pulmonary disease, unspecified: Secondary | ICD-10-CM | POA: Diagnosis not present

## 2019-07-10 DIAGNOSIS — K219 Gastro-esophageal reflux disease without esophagitis: Secondary | ICD-10-CM | POA: Diagnosis not present

## 2019-07-27 DIAGNOSIS — K4090 Unilateral inguinal hernia, without obstruction or gangrene, not specified as recurrent: Secondary | ICD-10-CM | POA: Diagnosis not present

## 2019-07-27 DIAGNOSIS — K219 Gastro-esophageal reflux disease without esophagitis: Secondary | ICD-10-CM | POA: Diagnosis not present

## 2019-07-27 DIAGNOSIS — J84112 Idiopathic pulmonary fibrosis: Secondary | ICD-10-CM | POA: Diagnosis not present

## 2019-07-27 DIAGNOSIS — I272 Pulmonary hypertension, unspecified: Secondary | ICD-10-CM | POA: Diagnosis not present

## 2019-07-27 DIAGNOSIS — M48061 Spinal stenosis, lumbar region without neurogenic claudication: Secondary | ICD-10-CM | POA: Diagnosis not present

## 2019-08-18 ENCOUNTER — Telehealth: Payer: Self-pay | Admitting: Internal Medicine

## 2019-08-18 NOTE — Telephone Encounter (Signed)
Patient already scheduled for covid screening on 09/30/19 for PFT on 10/04/19. Nothing further needed at this time.

## 2019-08-18 NOTE — Telephone Encounter (Signed)
Appointment with Dr. [name] changed to a 30 minute appointment.

## 2019-08-25 ENCOUNTER — Ambulatory Visit: Payer: Self-pay | Admitting: Surgery

## 2019-08-25 DIAGNOSIS — K4090 Unilateral inguinal hernia, without obstruction or gangrene, not specified as recurrent: Secondary | ICD-10-CM | POA: Diagnosis not present

## 2019-08-29 ENCOUNTER — Other Ambulatory Visit (HOSPITAL_COMMUNITY): Payer: Self-pay | Admitting: *Deleted

## 2019-08-29 NOTE — Patient Instructions (Addendum)
DUE TO COVID-19 ONLY ONE VISITOR IS ALLOWED TO COME WITH YOU AND STAY IN THE WAITING ROOM ONLY DURING PRE OP AND PROCEDURE DAY OF SURGERY. THE 1 VISITOR MAY VISIT WITH YOU AFTER SURGERY IN YOUR PRIVATE ROOM DURING VISITING HOURS ONLY!  YOU NEED TO HAVE A COVID 19 TEST ON_02/26/2021______ _0 :45 am____, THIS TEST MUST BE DONE BEFORE SURGERY, COME  [address] [place] , [address].  ([place]) ONCE YOUR COVID TEST IS COMPLETED, PLEASE BEGIN THE QUARANTINE INSTRUCTIONS AS OUTLINED IN YOUR HANDOUT.                Westley Chandler     Your procedure is scheduled on: Tuesday 09/05/2019   Report to [facility] Main  Entrance    Report to admitting at   0800 AM     Call this number if you have problems the morning of surgery [phone]    Remember: Do not eat food  :After Midnight.     NO SOLID FOOD AFTER MIDNIGHT THE NIGHT PRIOR TO SURGERY  And  NOTHING BY MOUTH EXCEPT CLEAR LIQUIDS UNTIL  0700 am .     PLEASE FINISH ENSURE DRINK PER SURGEON ORDER  WHICH NEEDS TO BE COMPLETED AT  0700 am.   CLEAR LIQUID DIET   Foods Allowed                                                                     Foods Excluded  Coffee and tea, regular and decaf                             liquids that you cannot  Plain Jell-O any favor except red or purple                                           see through such as: Fruit ices (not with fruit pulp)                                     milk, soups, orange juice  Iced Popsicles                                    All solid food Carbonated beverages, regular and diet                                    Cranberry, grape and apple juices Sports drinks like Gatorade Lightly seasoned clear broth or consume(fat free) Sugar, honey syrup  Sample Menu Breakfast                                Lunch  Supper Cranberry juice                    Beef broth                            Chicken  broth Jell-O                                     Grape juice                           Apple juice Coffee or tea                        Jell-O                                      Popsicle                                                Coffee or tea                        Coffee or tea  _____________________________________________________________________     BRUSH YOUR TEETH MORNING OF SURGERY AND RINSE YOUR MOUTH OUT, NO CHEWING GUM CANDY OR MINTS.     Take these medicines the morning of surgery with A SIP OF WATER: Loratadine (Claritin), use Fluticasone Propionate (Xhance) nasal spray                                 You may not have any metal on your body including hair pins and              piercings  Do not wear jewelry, make-up, lotions, powders or perfumes, deodorant                           Men may shave face and neck.   Do not bring valuables to the hospital. [place].  Contacts, dentures or bridgework may not be worn into surgery.  Leave suitcase in the car. After surgery it may be brought to your room.     Patients discharged the day of surgery will not be allowed to drive home. IF YOU ARE HAVING SURGERY AND GOING HOME THE SAME DAY, YOU MUST HAVE AN ADULT TO DRIVE YOU HOME AND  BE WITH YOU FOR 24 HOURS. YOU MAY GO HOME BY TAXI OR UBER OR ORTHERWISE, BUT AN ADULT MUST ACCOMPANY YOU HOME AND STAY WITH YOU FOR 24 HOURS.  Name and phone number of your driver:                Please read over the following fact sheets you were given: _____________________________________________________________________             [facility] - Preparing for Surgery Before surgery, you can play an important role.  Because skin is  not sterile, your skin needs to be as free of germs as possible.  You can reduce the number of germs on your skin by washing with CHG (chlorahexidine gluconate) soap before surgery.  CHG is an antiseptic cleaner  which kills germs and bonds with the skin to continue killing germs even after washing. Please DO NOT use if you have an allergy to CHG or antibacterial soaps.  If your skin becomes reddened/irritated stop using the CHG and inform your nurse when you arrive at Short Stay. Do not shave (including legs and underarms) for at least 48 hours prior to the first CHG shower.  You may shave your face/neck. Please follow these instructions carefully:  1.  Shower with CHG Soap the night before surgery and the  morning of Surgery.  2.  If you choose to wash your hair, wash your hair first as usual with your  normal  shampoo.  3.  After you shampoo, rinse your hair and body thoroughly to remove the  shampoo.                           4.  Use CHG as you would any other liquid soap.  You can apply chg directly  to the skin and wash                       Gently with a scrungie or clean washcloth.  5.  Apply the CHG Soap to your body ONLY FROM THE NECK DOWN.   Do not use on face/ open                           Wound or open sores. Avoid contact with eyes, ears mouth and genitals (private parts).                       Wash face,  Genitals (private parts) with your normal soap.             6.  Wash thoroughly, paying special attention to the area where your surgery  will be performed.  7.  Thoroughly rinse your body with warm water from the neck down.  8.  DO NOT shower/wash with your normal soap after using and rinsing off  the CHG Soap.                9.  Pat yourself dry with a clean towel.            10.  Wear clean pajamas.            11.  Place clean sheets on your bed the night of your first shower and do not  sleep with pets. Day of Surgery : Do not apply any lotions/deodorants the morning of surgery.  Please wear clean clothes to the hospital/surgery center.  FAILURE TO FOLLOW THESE INSTRUCTIONS MAY RESULT IN THE CANCELLATION OF YOUR SURGERY PATIENT SIGNATURE_________________________________  NURSE  SIGNATURE__________________________________  ________________________________________________________________________

## 2019-08-30 ENCOUNTER — Encounter (HOSPITAL_COMMUNITY): Payer: Self-pay | Admitting: Surgery

## 2019-08-30 DIAGNOSIS — K4090 Unilateral inguinal hernia, without obstruction or gangrene, not specified as recurrent: Secondary | ICD-10-CM

## 2019-08-30 NOTE — H&P (Signed)
General Surgery [facility]  Helmut Muster DOB: [birth year] Married / Language: English / Race: White Male   History of Present Illness   The patient is a 79 year old male who presents with an inguinal hernia.  CHIEF COMPLAINT: right inguinal hernia  Patient is referred by Dr. [name] for surgical evaluation and management of right inguinal hernia. Patient had first been noted on physical examination by his physician to have a right inguinal hernia approximately 4-5 years ago. This is gradually increased in size. Recently he was lifting some kind needles and had some discomfort. He now notes a visible bulge on the right side. He is wearing a truss for comfort. He is referred at this time for consideration for surgical repair. Patient denies any symptoms on the left side. He is had no other abdominal surgery. He denies any signs or symptoms of obstruction. Patient does have a history of pulmonary fibrosis and is under treatment. 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 He would like to get his hernia repaired in the near future.   Past Surgical History Cataract Surgery  Bilateral. Knee Surgery  Right. Spinal Surgery - Lower Back   Diagnostic Studies History Colonoscopy  1-5 years ago  Allergies  No Known Drug Allergies  Allergies Reconciled   Medication History Acetaminophen-Codeine (300-30MG Tablet, Oral) Active. Carisoprodol (350MG Tablet, Oral) Active. Cefuroxime Axetil (250MG Tablet, Oral) Active. Cholecalciferol (250 MCG(10000 UT) Tablet, Oral) Active. Diclofenac Sodium (75MG Tablet DR, Oral) Active. Diclofenac Sodium (1% Gel, Transdermal) Active. Dicyclomine HCl (10MG Capsule, Oral) Active. Diphenoxylate-Atropine (2.5-0.025MG Tablet, Oral) Active. Esomeprazole Magnesium (40MG Capsule DR, Oral) Active. Ezetimibe (10MG Tablet, Oral) Active. Fexofenadine HCl (30MG/5ML Suspension, Oral)  Active. Glucosamine-Chondroitin (500-400MG Capsule, Oral) Active. Glucosamine-Chondroitin (500-400MG Tablet, Oral) Active. HYDROcodone-Acetaminophen (5-325MG Tablet, Oral) Active. Meclizine HCl (25MG Tablet, Oral) Active. Methocarbamol (500MG Tablet, Oral) Active. Nystatin (100000 UNIT/GM Cream, External) Active. Ondansetron (8MG Tablet Disint, Oral) Active. Pirfenidone (267MG Capsule, Oral) Active. Triamcinolone Acetonide (0.1% Cream, External) Active. valACYclovir HCl (1GM Tablet, Oral) Active. Xhance (93MCG/ACT Exhaler Susp, Nasal) Active. Medications Reconciled  Social History  Alcohol use  Occasional alcohol use. Caffeine use  Coffee. No drug use  Tobacco use  Never smoker.  Family History  Breast Cancer  Mother. Heart Disease  Father.  Other Problems Back Pain  Gastroesophageal Reflux Disease   Review of Systems General Not Present- Appetite Loss, Chills, Fatigue, Fever, Night Sweats, Weight Gain and Weight Loss. Skin Not Present- Change in Wart/Mole, Dryness, Hives, Jaundice, New Lesions, Non-Healing Wounds, Rash and Ulcer. HEENT Not Present- Earache, Hearing Loss, Hoarseness, Nose Bleed, Oral Ulcers, Ringing in the Ears, Seasonal Allergies, Sinus Pain, Sore Throat, Visual Disturbances, Wears glasses/contact lenses and Yellow Eyes. Respiratory Not Present- Bloody sputum, Chronic Cough, Difficulty Breathing, Snoring and Wheezing. Breast Not Present- Breast Mass, Breast Pain, Nipple Discharge and Skin Changes. Cardiovascular Not Present- Chest Pain, Difficulty Breathing Lying Down, Leg Cramps, Palpitations, Rapid Heart Rate, Shortness of Breath and Swelling of Extremities. Gastrointestinal Not Present- Abdominal Pain, Bloating, Bloody Stool, Change in Bowel Habits, Chronic diarrhea, Constipation, Difficulty Swallowing, Excessive gas, Gets full quickly at meals, Hemorrhoids, Indigestion, Nausea, Rectal Pain and Vomiting. Male Genitourinary Not Present- Blood  in Urine, Change in Urinary Stream, Frequency, Impotence, Nocturia, Painful Urination, Urgency and Urine Leakage. Musculoskeletal Not Present- Back Pain, Joint Pain, Joint Stiffness, Muscle Pain, Muscle Weakness and Swelling of Extremities. Neurological Not Present- Decreased Memory, Fainting, Headaches, Numbness, Seizures, Tingling, Tremor, Trouble walking and Weakness. Psychiatric Not Present- Anxiety,  Bipolar, Change in Sleep Pattern, Depression, Fearful and Frequent crying. Endocrine Not Present- Cold Intolerance, Excessive Hunger, Hair Changes, Heat Intolerance, Hot flashes and New Diabetes. Hematology Not Present- Blood Thinners, Easy Bruising, Excessive bleeding, Gland problems, HIV and Persistent Infections.  Vitals Weight: 173.4 lb Height: 70in Body Surface Area: 1.96 m Body Mass Index: 24.88 kg/m  Temp.: 98.9F(Temporal)  Pulse: 101 (Regular)  P.OX: 97% (Room air) BP: 140/58 (Sitting, Left Arm, Standard)  Physical Exam  GENERAL APPEARANCE Development: normal Nutritional status: normal Gross deformities: none  SKIN Rash, lesions, ulcers: none Induration, erythema: none Nodules: none palpable  EYES Conjunctiva and lids: normal Pupils: equal and reactive Iris: normal bilaterally  EARS, NOSE, MOUTH, THROAT External ears: no lesion or deformity External nose: no lesion or deformity Hearing: grossly normal Due to Covid-19 pandemic, patient is wearing a mask.  NECK Symmetric: yes Trachea: midline Thyroid: no palpable nodules in the thyroid bed  CHEST Respiratory effort: normal Retraction or accessory muscle use: no Breath sounds: normal bilaterally Rales, rhonchi, wheeze: none  CARDIOVASCULAR Auscultation: regular rhythm, normal rate Murmurs: none Pulses: radial pulse 2+ palpable Lower extremity edema: none  ABDOMEN Distension: none Masses: none palpable Tenderness: none Hepatosplenomegaly: not present Hernia: not  present  GENITOURINARY Penis: no lesions Scrotum: no masses There is an obvious bulge on the right groin. Palpation in the inguinal canal shows a moderate-sized right inguinal hernia. This is soft and reducible but augments with coughing and Valsalva. Palpation in the left inguinal canal with cough and Valsalva shows no sign of hernia.  MUSCULOSKELETAL Station and gait: normal Digits and nails: no clubbing or cyanosis Muscle strength: grossly normal all extremities Range of motion: grossly normal all extremities Deformity: none  LYMPHATIC Cervical: none palpable Supraclavicular: none palpable  PSYCHIATRIC Oriented to person, place, and time: yes Mood and affect: normal for situation Judgment and insight: appropriate for situation    Assessment & Plan   INGUINAL HERNIA OF RIGHT SIDE WITHOUT OBSTRUCTION OR GANGRENE (K40.90)  Pt Education - Pamphlet Given - Hernia Surgery: discussed with patient and provided information.  Patient is referred by his primary care physician for surgical evaluation and management of right inguinal hernia. Patient is provided with written literature on hernia surgery to review at home.  Patient has a moderate sized right inguinal hernia which is reducible. I have recommended repair by open technique using mesh. We discussed the risk and benefits of the procedure. The recurrence rate is lowest with this approach. We discussed restrictions on his activities following surgery. We discussed his return to normal activities. We would do this as an outpatient surgical procedure. The patient understands and wishes to proceed in the near future.  The risks and benefits of the procedure have been discussed at length with the patient. The patient understands the proposed procedure, potential alternative treatments, and the course of recovery to be expected. All of the patient's questions have been answered at this time. The patient wishes to proceed with  surgery.  Armandina Gemma, MD [facility] Office: [phone]

## 2019-08-31 ENCOUNTER — Other Ambulatory Visit (HOSPITAL_COMMUNITY): Payer: PPO

## 2019-09-01 ENCOUNTER — Encounter (HOSPITAL_COMMUNITY): Payer: Self-pay

## 2019-09-01 ENCOUNTER — Other Ambulatory Visit (HOSPITAL_COMMUNITY)
Admission: RE | Admit: 2019-09-01 | Discharge: 2019-09-01 | Disposition: A | Discharge status: Home / Self Care | Payer: PPO | Source: Ambulatory Visit | Attending: Surgery | Admitting: Surgery

## 2019-09-01 ENCOUNTER — Other Ambulatory Visit: Payer: Self-pay

## 2019-09-01 ENCOUNTER — Encounter (HOSPITAL_COMMUNITY)
Admission: RE | Admit: 2019-09-01 | Discharge: 2019-09-01 | Disposition: A | Discharge status: Home / Self Care | Payer: PPO | Source: Ambulatory Visit | Attending: Surgery | Admitting: Surgery

## 2019-09-01 DIAGNOSIS — Z01812 Encounter for preprocedural laboratory examination: Secondary | ICD-10-CM | POA: Diagnosis not present

## 2019-09-01 DIAGNOSIS — K219 Gastro-esophageal reflux disease without esophagitis: Secondary | ICD-10-CM | POA: Insufficient documentation

## 2019-09-01 DIAGNOSIS — Z20822 Contact with and (suspected) exposure to covid-19: Secondary | ICD-10-CM | POA: Insufficient documentation

## 2019-09-01 DIAGNOSIS — K4091 Unilateral inguinal hernia, without obstruction or gangrene, recurrent: Secondary | ICD-10-CM | POA: Insufficient documentation

## 2019-09-01 DIAGNOSIS — Z79899 Other long term (current) drug therapy: Secondary | ICD-10-CM | POA: Insufficient documentation

## 2019-09-01 DIAGNOSIS — Z7982 Long term (current) use of aspirin: Secondary | ICD-10-CM | POA: Insufficient documentation

## 2019-09-01 DIAGNOSIS — E785 Hyperlipidemia, unspecified: Secondary | ICD-10-CM | POA: Insufficient documentation

## 2019-09-01 DIAGNOSIS — Z01818 Encounter for other preprocedural examination: Secondary | ICD-10-CM | POA: Diagnosis not present

## 2019-09-01 LAB — CBC
HCT: 37.9 % — ABNORMAL LOW (ref 39.0–52.0)
Hemoglobin: 13.4 g/dL (ref 13.0–17.0)
MCH: 33.2 pg (ref 26.0–34.0)
MCHC: 35.4 g/dL (ref 30.0–36.0)
MCV: 93.8 fL (ref 80.0–100.0)
Platelets: 145 10*3/uL — ABNORMAL LOW (ref 150–400)
RBC: 4.04 MIL/uL — ABNORMAL LOW (ref 4.22–5.81)
RDW: 13 % (ref 11.5–15.5)
WBC: 4.1 10*3/uL (ref 4.0–10.5)
nRBC: 0 % (ref 0.0–0.2)

## 2019-09-01 LAB — SARS CORONAVIRUS 2 (TAT 6-24 HRS): SARS Coronavirus 2: NEGATIVE

## 2019-09-01 NOTE — Progress Notes (Signed)
PAT call complete

## 2019-09-01 NOTE — Progress Notes (Signed)
Anesthesia Chart Review   Case: [id number] Date/Time: 09/05/19 0945   Procedure: OPEN REPAIR RIGHT INGUINAL HERNIA WITH MESH (Right )   Anesthesia type: General   Pre-op diagnosis: RIGHT INGUINAL HERNIA, REDUCIBLE   Location: [facility] ROOM 01 / [facility]   Surgeons: Armandina Gemma, MD      DISCUSSION:79 y.o. never smoker with h/o GERD, HLD, idiopathic pulmonary fibrosis, right inguinal hernia scheduled for above procedure 09/05/19 with Dr. Armandina Gemma.   ILD stable.  Pt last seen by pulmonologist 04/21/2019. Stable at this visit with no changes to treatment plan made.  6 month follow up recommended.   Anticipate pt can proceed with planned procedure barring acute status change.   VS: BP 138/83   Pulse 79   Temp 36.7 C (Oral)   Resp 16   Ht _0  (1.778 m)   Wt 78 kg   SpO2 99%   BMI 24.68 kg/m   PROVIDERS: [name], MD is PCP   Brand Males, MD is Pulmonologist  LABS: Labs reviewed: Acceptable for surgery. (all labs ordered are listed, but only abnormal results are displayed)  Labs Reviewed  CBC - Abnormal; Notable for the following components:      Result Value   RBC 4.04 (*)    HCT 37.9 (*)    Platelets 145 (*)    All other components within normal limits     IMAGES:   EKG: 06/02/17 Rate 89 bpm Normal sinus rhythm   CV: Myocardial Perfusion 03/02/2017  Nuclear stress EF: 62%. No wall motion abnormalities  There was no ST segment deviation noted during stress.  The study is normal.  This is a low risk study. No ischemia identified.  Echo 03/02/2017 Study Conclusions   - Left ventricle: The cavity size was normal. Wall thickness was  increased in a pattern of mild LVH. Systolic function was normal.  The estimated ejection fraction was in the range of 60% to 65%.  Wall motion was normal; there were no regional wall motion  abnormalities. Doppler parameters are consistent with abnormal  left ventricular relaxation (grade 1 diastolic  dysfunction).  - Aortic valve: There was no stenosis.  - Aorta: Mildly dilated aortic root. Aortic root dimension: 39 mm  (ED).  - Mitral valve: Mildly calcified annulus. There was trivial  regurgitation.  - Right ventricle: The cavity size was normal. Systolic function  was normal.  - Tricuspid valve: Peak RV-RA gradient (S): 17 mm Hg.  - Pulmonary arteries: PA peak pressure: 20 mm Hg (S).  - Inferior vena cava: The vessel was normal in size. The  respirophasic diameter changes were in the normal range (= 50%),  consistent with normal central venous pressure.  Past Medical History:  Diagnosis Date  . Arthritis   . Diverticulosis of colon (without mention of hemorrhage)   . GERD (gastroesophageal reflux disease)   . Hiatal hernia   . HLD (hyperlipidemia)   . Internal hemorrhoids without mention of complication   . Irritable bowel syndrome   . Other specified gastritis without mention of hemorrhage   . Pulmonary fibrosis ([place])     Past Surgical History:  Procedure Laterality Date  . CARPAL TUNNEL RELEASE     left  . CATARACT EXTRACTION  06/2011   bilateral  . [place]   x 2  . LUMBAR LAMINECTOMY/DECOMPRESSION MICRODISCECTOMY Left 07/11/2013   Procedure: LUMBAR ONE TO TWO, LUMBAR TWO TO THREE, LUMBAR THREE TO FOUR LUMBAR LAMINECTOMY/DECOMPRESSION MICRODISCECTOMY 3 LEVELS;  Surgeon: Charlie Pitter, MD;  Location: [facility];  Service: Neurosurgery;  Laterality: Left;  . NASAL SINUS SURGERY      MEDICATIONS: . acetaminophen-codeine (TYLENOL #3) 300-30 MG per tablet  . aspirin EC 81 MG tablet  . carisoprodol (SOMA) 350 MG tablet  . Cholecalciferol (VITAMIN D-3) 1000 UNITS CAPS  . CO-ENZYME Q-10 PO  . diclofenac (VOLTAREN) 75 MG EC tablet  . diclofenac sodium (VOLTAREN) 1 % GEL  . dicyclomine (BENTYL) 10 MG capsule  . diphenoxylate-atropine (LOMOTIL) 2.5-0.025 MG tablet  . esomeprazole (NEXIUM) 40 MG capsule  . ezetimibe (ZETIA) 10 MG tablet   . Fluticasone Propionate (XHANCE) 38 MCG/ACT EXHU  . Glucosamine Sulfate 500 MG TABS  . HYDROcodone-acetaminophen (NORCO/VICODIN) 5-325 MG per tablet  . loratadine (CLARITIN) 10 MG tablet  . meclizine (ANTIVERT) 25 MG tablet  . methocarbamol (ROBAXIN) 500 MG tablet  . ondansetron (ZOFRAN-ODT) 8 MG disintegrating tablet  . Pirfenidone (ESBRIET) 801 MG TABS  . triamcinolone cream (KENALOG) 0.1 %   No current facility-administered medications for this encounter.      Maia Plan [facility] Pre-Surgical Testing [phone] 09/01/19  11:00 AM

## 2019-09-01 NOTE — Progress Notes (Signed)
PCP - Dr. Audry Pili Cardiologist -  Pulmonary Dr. [name]  Chest x-ray - CT chest   High resolution 03-29-19  EKG -  Stress Test - 03-02-17  epic ECHO - 03-02-17   epic Cardiac Cath -   Sleep Study -  CPAP -   Fasting Blood Sugar -  Checks Blood Sugar _____ times a day  Blood Thinner Instructions: Aspirin Instructions:81 mg asa Last Dose:  Anesthesia review: Idiopathic pulmonary fibrosis  Patient denies shortness of breath, fever, cough and chest pain at PAT appointment   Patient verbalized understanding of instructions that were given to them at the PAT appointment. Patient was also instructed that they will need to review over the PAT instructions again at home before surgery.

## 2019-09-05 ENCOUNTER — Ambulatory Visit (HOSPITAL_COMMUNITY): Payer: PPO | Admitting: Physician Assistant

## 2019-09-05 ENCOUNTER — Encounter (HOSPITAL_COMMUNITY)
Admission: RE | Disposition: A | Discharge status: Home / Self Care | Payer: Self-pay | Source: Home / Self Care | Attending: Surgery

## 2019-09-05 ENCOUNTER — Ambulatory Visit (HOSPITAL_COMMUNITY)
Admission: RE | Admit: 2019-09-05 | Discharge: 2019-09-05 | Disposition: A | Discharge status: Home / Self Care | Payer: PPO | Attending: Surgery | Admitting: Surgery

## 2019-09-05 ENCOUNTER — Encounter (HOSPITAL_COMMUNITY): Payer: Self-pay | Admitting: Surgery

## 2019-09-05 ENCOUNTER — Ambulatory Visit (HOSPITAL_COMMUNITY): Payer: PPO | Admitting: Anesthesiology

## 2019-09-05 ENCOUNTER — Other Ambulatory Visit: Payer: Self-pay

## 2019-09-05 ENCOUNTER — Other Ambulatory Visit: Payer: Self-pay | Admitting: Internal Medicine

## 2019-09-05 DIAGNOSIS — Z79899 Other long term (current) drug therapy: Secondary | ICD-10-CM | POA: Insufficient documentation

## 2019-09-05 DIAGNOSIS — K219 Gastro-esophageal reflux disease without esophagitis: Secondary | ICD-10-CM | POA: Diagnosis not present

## 2019-09-05 DIAGNOSIS — K4090 Unilateral inguinal hernia, without obstruction or gangrene, not specified as recurrent: Secondary | ICD-10-CM

## 2019-09-05 DIAGNOSIS — K449 Diaphragmatic hernia without obstruction or gangrene: Secondary | ICD-10-CM | POA: Diagnosis not present

## 2019-09-05 DIAGNOSIS — J8410 Pulmonary fibrosis, unspecified: Secondary | ICD-10-CM | POA: Insufficient documentation

## 2019-09-05 DIAGNOSIS — M1990 Unspecified osteoarthritis, unspecified site: Secondary | ICD-10-CM | POA: Diagnosis not present

## 2019-09-05 HISTORY — PX: INGUINAL HERNIA REPAIR: SHX194

## 2019-09-05 SURGERY — REPAIR, HERNIA, INGUINAL, ADULT
Anesthesia: General | Site: Inguinal | Laterality: Right

## 2019-09-05 MED ORDER — LIDOCAINE 2% (20 MG/ML) 5 ML SYRINGE
INTRAMUSCULAR | Status: DC | PRN
  Administered 2019-09-05: 60 mg via INTRAVENOUS

## 2019-09-05 MED ORDER — PHENYLEPHRINE HCL (PRESSORS) 10 MG/ML IV SOLN
INTRAVENOUS | Status: AC
  Filled 2019-09-05: qty 1

## 2019-09-05 MED ORDER — DEXAMETHASONE SODIUM PHOSPHATE 10 MG/ML IJ SOLN
INTRAMUSCULAR | Status: AC
  Filled 2019-09-05: qty 1

## 2019-09-05 MED ORDER — CHLORHEXIDINE GLUCONATE CLOTH 2 % EX PADS: 6.0000 | MEDICATED_PAD | Freq: Once | CUTANEOUS | Status: DC

## 2019-09-05 MED ORDER — EPHEDRINE SULFATE-NACL 50-0.9 MG/10ML-% IV SOSY
PREFILLED_SYRINGE | INTRAVENOUS | Status: DC | PRN
  Administered 2019-09-05: 5 mg via INTRAVENOUS
  Administered 2019-09-05 (×2): 10 mg via INTRAVENOUS

## 2019-09-05 MED ORDER — HYDROMORPHONE HCL 1 MG/ML IJ SOLN: 0.2500 mg | INTRAMUSCULAR | Status: DC | PRN

## 2019-09-05 MED ORDER — PROPOFOL 10 MG/ML IV BOLUS
INTRAVENOUS | Status: AC
  Filled 2019-09-05: qty 20

## 2019-09-05 MED ORDER — PHENYLEPHRINE HCL-NACL 10-0.9 MG/250ML-% IV SOLN
INTRAVENOUS | Status: DC | PRN
  Administered 2019-09-05: 30 ug/min via INTRAVENOUS

## 2019-09-05 MED ORDER — ACETAMINOPHEN 500 MG PO TABS
1000.0000 mg | ORAL_TABLET | Freq: Once | ORAL | Status: AC
  Administered 2019-09-05: 1000 mg via ORAL

## 2019-09-05 MED ORDER — LIDOCAINE 2% (20 MG/ML) 5 ML SYRINGE
INTRAMUSCULAR | Status: AC
  Filled 2019-09-05: qty 5

## 2019-09-05 MED ORDER — 0.9 % SODIUM CHLORIDE (POUR BTL) OPTIME
TOPICAL | Status: DC | PRN
  Administered 2019-09-05: 10:00:00 1000 mL

## 2019-09-05 MED ORDER — BUPIVACAINE HCL (PF) 0.5 % IJ SOLN
INTRAMUSCULAR | Status: AC
  Filled 2019-09-05: qty 30

## 2019-09-05 MED ORDER — CEFAZOLIN SODIUM-DEXTROSE 2-4 GM/100ML-% IV SOLN
2.0000 g | INTRAVENOUS | Status: AC
  Administered 2019-09-05: 2 g via INTRAVENOUS
  Filled 2019-09-05: qty 100

## 2019-09-05 MED ORDER — FENTANYL CITRATE (PF) 100 MCG/2ML IJ SOLN
INTRAMUSCULAR | Status: DC | PRN
  Administered 2019-09-05: 25 ug via INTRAVENOUS
  Administered 2019-09-05 (×2): 50 ug via INTRAVENOUS
  Administered 2019-09-05: 25 ug via INTRAVENOUS

## 2019-09-05 MED ORDER — LACTATED RINGERS IV SOLN: INTRAVENOUS | Status: DC

## 2019-09-05 MED ORDER — ACETAMINOPHEN 500 MG PO TABS
ORAL_TABLET | ORAL | Status: AC
  Filled 2019-09-05: qty 2

## 2019-09-05 MED ORDER — HYDROCODONE-ACETAMINOPHEN 5-325 MG PO TABS: 1.0000 | ORAL_TABLET | ORAL | 0 refills | Status: DC | PRN

## 2019-09-05 MED ORDER — ONDANSETRON HCL 4 MG/2ML IJ SOLN
INTRAMUSCULAR | Status: AC
  Filled 2019-09-05: qty 2

## 2019-09-05 MED ORDER — BUPIVACAINE HCL (PF) 0.5 % IJ SOLN
INTRAMUSCULAR | Status: DC | PRN
  Administered 2019-09-05: 20 mL

## 2019-09-05 MED ORDER — ONDANSETRON HCL 4 MG/2ML IJ SOLN
INTRAMUSCULAR | Status: DC | PRN
  Administered 2019-09-05: 4 mg via INTRAVENOUS

## 2019-09-05 MED ORDER — FENTANYL CITRATE (PF) 100 MCG/2ML IJ SOLN
INTRAMUSCULAR | Status: AC
  Filled 2019-09-05: qty 2

## 2019-09-05 MED ORDER — KETOROLAC TROMETHAMINE 15 MG/ML IJ SOLN
INTRAMUSCULAR | Status: DC | PRN
  Administered 2019-09-05: 15 mg via INTRAVENOUS

## 2019-09-05 MED ORDER — DEXAMETHASONE SODIUM PHOSPHATE 10 MG/ML IJ SOLN
INTRAMUSCULAR | Status: DC | PRN
  Administered 2019-09-05: 4 mg via INTRAVENOUS

## 2019-09-05 MED ORDER — BUPIVACAINE LIPOSOME 1.3 % IJ SUSP
20.0000 mL | Freq: Once | INTRAMUSCULAR | Status: AC
  Administered 2019-09-05: 20 mL
  Filled 2019-09-05: qty 20

## 2019-09-05 MED ORDER — PROPOFOL 10 MG/ML IV BOLUS
INTRAVENOUS | Status: DC | PRN
  Administered 2019-09-05: 50 mg via INTRAVENOUS
  Administered 2019-09-05: 30 mg via INTRAVENOUS
  Administered 2019-09-05: 120 mg via INTRAVENOUS

## 2019-09-05 SURGICAL SUPPLY — 35 items
ADH SKN CLS APL DERMABOND .7 (GAUZE/BANDAGES/DRESSINGS) ×2
APL PRP STRL LF DISP 70% ISPRP (MISCELLANEOUS) ×2
BLADE SURG 15 STRL LF DISP TIS (BLADE) ×1 IMPLANT
BLADE SURG 15 STRL SS (BLADE) ×3
CHLORAPREP W/TINT 26 (MISCELLANEOUS) ×6 IMPLANT
COVER SURGICAL LIGHT HANDLE (MISCELLANEOUS) ×3 IMPLANT
COVER WAND RF STERILE (DRAPES) ×3 IMPLANT
DECANTER SPIKE VIAL GLASS SM (MISCELLANEOUS) ×3 IMPLANT
DERMABOND ADVANCED (GAUZE/BANDAGES/DRESSINGS) ×4
DERMABOND ADVANCED .7 DNX12 (GAUZE/BANDAGES/DRESSINGS) IMPLANT
DRAIN PENROSE 0.5X18 (DRAIN) ×3 IMPLANT
DRAPE LAPAROTOMY TRNSV 102X78 (DRAPES) ×3 IMPLANT
ELECT REM PT RETURN 15FT ADLT (MISCELLANEOUS) ×3 IMPLANT
GLOVE BIOGEL PI IND STRL 7.0 (GLOVE) ×1 IMPLANT
GLOVE BIOGEL PI INDICATOR 7.0 (GLOVE) ×2
GLOVE SURG ORTHO 8.0 STRL STRW (GLOVE) ×3 IMPLANT
GOWN STRL REUS W/TWL XL LVL3 (GOWN DISPOSABLE) ×6 IMPLANT
KIT BASIN OR (CUSTOM PROCEDURE TRAY) ×3 IMPLANT
KIT TURNOVER KIT A (KITS) IMPLANT
MESH ULTRAPRO 3X6 7.6X15CM (Mesh General) ×2 IMPLANT
NDL HYPO 25X1 1.5 SAFETY (NEEDLE) ×1 IMPLANT
NEEDLE HYPO 25X1 1.5 SAFETY (NEEDLE) ×3 IMPLANT
NS IRRIG 1000ML POUR BTL (IV SOLUTION) ×3 IMPLANT
PACK BASIC VI WITH GOWN DISP (CUSTOM PROCEDURE TRAY) ×3 IMPLANT
PENCIL SMOKE EVACUATOR (MISCELLANEOUS) IMPLANT
SPONGE LAP 4X18 RFD (DISPOSABLE) ×9 IMPLANT
SUT MNCRL AB 4-0 PS2 18 (SUTURE) ×3 IMPLANT
SUT NOVA NAB GS-21 0 18 T12 DT (SUTURE) ×2 IMPLANT
SUT NOVA NAB GS-22 2 0 T19 (SUTURE) ×6 IMPLANT
SUT SILK 2 0 SH (SUTURE) ×3 IMPLANT
SUT VIC AB 3-0 SH 18 (SUTURE) ×3 IMPLANT
SYR BULB IRRIGATION 50ML (SYRINGE) ×3 IMPLANT
SYR CONTROL 10ML LL (SYRINGE) ×3 IMPLANT
TOWEL OR 17X26 10 PK STRL BLUE (TOWEL DISPOSABLE) ×3 IMPLANT
YANKAUER SUCT BULB TIP 10FT TU (MISCELLANEOUS) ×3 IMPLANT

## 2019-09-05 NOTE — Transfer of Care (Signed)
Immediate Anesthesia Transfer of Care Note  Patient: Kenneth [name]  Procedure(s) Performed: OPEN REPAIR RIGHT INGUINAL HERNIA WITH MESH (Right Inguinal)  Patient Location: PACU  Anesthesia Type:General  Level of Consciousness: awake and drowsy  Airway & Oxygen Therapy: Patient Spontanous Breathing and Patient connected to face mask oxygen  Post-op Assessment: Report given to RN and Post -op Vital signs reviewed and stable  Post vital signs: Reviewed and stable  Last Vitals:  Vitals Value Taken Time  BP 130/68 09/05/19 1055  Temp    Pulse 78 09/05/19 1056  Resp 23 09/05/19 1056  SpO2 100 % 09/05/19 1056  Vitals shown include unvalidated device data.  Last Pain:  Vitals:   09/05/19 0826  TempSrc:   PainSc: 0-No pain         Complications: No apparent anesthesia complications

## 2019-09-05 NOTE — Op Note (Signed)
Inguinal Hernia, Open, Procedure Note  Pre-operative Diagnosis:  Right inguinal hernia, reducible  Post-operative Diagnosis: same  Surgeon:  Armandina Gemma, MD  Anesthesia:  General  Preparation:  Chlora-prep  Estimated Blood Loss: minimal  Complications:  none  Indications: The patient presented with a reducible right inguinal hernia.    Procedure Details  The patient was evaluated in the holding area. All of the patient's questions were answered and the proposed procedure was confirmed. The site of the procedure was properly marked. The patient was taken to the Operating Room, identified by name, and the procedure verified as inguinal hernia repair.  The patient was placed in the supine position and underwent induction of anesthesia. A "Time Out" was performed per routine. The lower abdomen and groin were prepped and draped in the usual aseptic fashion.  After ascertaining that an adequate level of anesthesia had been obtained, an incision was made in the groin with a #10 blade.  Dissection was carried through the subcutaneous tissues and hemostasis obtained with the electrocautery.  A Gelpi retractor was placed for exposure.  The external oblique fascia was incised in line with it's fibers and extended through the external inguinal ring.  The cord structures were dissected out of the inguinal canal and encircled with a Penrose drain.  The floor of the inguinal canal was dissected out.  There was a moderate sized direct inguinal defect.  The sac was dissected off of the cord structures and reduced.  The defect was closed with interrupted 0-Novofil sutures.  The cord was explored and there was no sign of indirect hernia sac.  The floor of the inguinal canal was reconstructed with Ethicon Ultrapro mesh cut to the appropriate dimensions.  It was secured to the pubic tubercle with a 2-0 Novafil suture and along the inguinal ligament with a running 2-0 Novafil suture.  Mesh was split to accommodate  the cord structures.  The superior margin of the mesh was secured to the transversalis and internal oblique musculature with interrupted 2-0 Novafil sutures.  The tails of the mesh were overlapped lateral to the cord structures and secured to the inguinal ligament with interrupted 2-0 Novafil sutures to recreate the internal inguinal ring.  Cord structures were returned to the inguinal canal.  Local anesthetic was infiltrated throughout the field.  External oblique fascia was closed with interrupted 3-0 Vicryl sutures.  Subcutaneous tissues were closed with interrupted 3-0 Vicryl sutures.  Skin was anesthetized with local anesthetic, and the skin edges were re-approximated with a running 4-0 Monocryl suture.  Wound was washed and dried and Dermabond was applied.  Instrument, sponge, and needle counts were correct prior to closure and at the conclusion of the case.  The patient tolerated the procedure well.  The patient was awakened from anesthesia and brought to the recovery room in stable condition.  Armandina Gemma, MD [facility] Office: [phone]

## 2019-09-05 NOTE — Anesthesia Preprocedure Evaluation (Addendum)
Anesthesia Evaluation  Patient identified by MRN, date of birth, ID band Patient awake    Reviewed: Allergy & Precautions, H&P , NPO status , Patient's Chart, lab work & pertinent test results  Airway Mallampati: III  TM Distance: >3 FB Neck ROM: Full    Dental no notable dental hx. (+) Teeth Intact, Dental Advisory Given   Pulmonary neg pulmonary ROS,  Pulmonary fibrosis   Pulmonary exam normal breath sounds clear to auscultation       Cardiovascular negative cardio ROS   Rhythm:Regular Rate:Normal     Neuro/Psych negative neurological ROS  negative psych ROS   GI/Hepatic Neg liver ROS, hiatal hernia, GERD  Medicated and Controlled,  Endo/Other  negative endocrine ROS  Renal/GU negative Renal ROS  negative genitourinary   Musculoskeletal  (+) Arthritis , Osteoarthritis,    Abdominal   Peds  Hematology negative hematology ROS (+)   Anesthesia Other Findings   Reproductive/Obstetrics negative OB ROS                            Anesthesia Physical Anesthesia Plan  ASA: II  Anesthesia Plan: General   Post-op Pain Management:    Induction: Intravenous  PONV Risk Score and Plan: 3 and Dexamethasone, Ondansetron and Treatment may vary due to age or medical condition  Airway Management Planned: LMA  Additional Equipment:   Intra-op Plan:   Post-operative Plan: Extubation in OR  Informed Consent: I have reviewed the patients History and Physical, chart, labs and discussed the procedure including the risks, benefits and alternatives for the proposed anesthesia with the patient or authorized representative who has indicated his/her understanding and acceptance.     Dental advisory given  Plan Discussed with: CRNA  Anesthesia Plan Comments:         Anesthesia Quick Evaluation

## 2019-09-05 NOTE — Anesthesia Procedure Notes (Signed)
Procedure Name: LMA Insertion Date/Time: 09/05/2019 9:37 AM Performed by: Niel Hummer, CRNA Pre-anesthesia Checklist: Patient identified, Emergency Drugs available, Suction available and Patient being monitored Patient Re-evaluated:Patient Re-evaluated prior to induction Oxygen Delivery Method: Circle system utilized Preoxygenation: Pre-oxygenation with 100% oxygen Induction Type: IV induction LMA: LMA inserted LMA Size: 4.0 Dental Injury: Teeth and Oropharynx as per pre-operative assessment

## 2019-09-05 NOTE — Interval H&P Note (Signed)
History and Physical Interval Note:  09/05/2019 9:16 AM  Kenneth [name]  has presented today for surgery, with the diagnosis of RIGHT INGUINAL HERNIA, REDUCIBLE.  The various methods of treatment have been discussed with the patient and family. After consideration of risks, benefits and other options for treatment, the patient has consented to    Procedure(s): OPEN REPAIR RIGHT INGUINAL HERNIA WITH MESH (Right) as a surgical intervention.    The patient's history has been reviewed, patient examined, no change in status, stable for surgery.  I have reviewed the patient's chart and labs.  Questions were answered to the patient's satisfaction.    Armandina Gemma, MD [facility] Office: [place]

## 2019-09-06 ENCOUNTER — Encounter: Payer: Self-pay | Admitting: *Deleted

## 2019-09-06 NOTE — Anesthesia Postprocedure Evaluation (Signed)
Anesthesia Post Note  Patient: Kenneth [name]  Procedure(s) Performed: OPEN REPAIR RIGHT INGUINAL HERNIA WITH MESH (Right Inguinal)     Patient location during evaluation: PACU Anesthesia Type: General Level of consciousness: awake and alert Pain management: pain level controlled Vital Signs Assessment: post-procedure vital signs reviewed and stable Respiratory status: spontaneous breathing, nonlabored ventilation and respiratory function stable Cardiovascular status: blood pressure returned to baseline and stable Postop Assessment: no apparent nausea or vomiting Anesthetic complications: no    Last Vitals:  Vitals:   09/05/19 1130 09/05/19 1205  BP: (!) 145/69 134/65  Pulse: 81 86  Resp: (!) 21 18  Temp: 36.8 C   SpO2: 96% 99%    Last Pain:  Vitals:   09/05/19 1205  TempSrc:   PainSc: 0-No pain                 Mardee Clune,W. EDMOND

## 2019-09-30 ENCOUNTER — Other Ambulatory Visit (HOSPITAL_COMMUNITY)
Admission: RE | Admit: 2019-09-30 | Discharge: 2019-09-30 | Disposition: A | Discharge status: Home / Self Care | Payer: PPO | Source: Ambulatory Visit | Attending: Internal Medicine | Admitting: Internal Medicine

## 2019-09-30 DIAGNOSIS — Z01812 Encounter for preprocedural laboratory examination: Secondary | ICD-10-CM | POA: Diagnosis not present

## 2019-09-30 DIAGNOSIS — Z20822 Contact with and (suspected) exposure to covid-19: Secondary | ICD-10-CM | POA: Insufficient documentation

## 2019-09-30 LAB — SARS CORONAVIRUS 2 (TAT 6-24 HRS): SARS Coronavirus 2: NEGATIVE

## 2019-10-03 ENCOUNTER — Other Ambulatory Visit: Payer: Self-pay | Admitting: *Deleted

## 2019-10-03 DIAGNOSIS — J84112 Idiopathic pulmonary fibrosis: Secondary | ICD-10-CM

## 2019-10-04 ENCOUNTER — Encounter: Payer: PPO | Admitting: Internal Medicine

## 2019-10-04 ENCOUNTER — Other Ambulatory Visit: Payer: Self-pay

## 2019-10-04 ENCOUNTER — Ambulatory Visit (INDEPENDENT_AMBULATORY_CARE_PROVIDER_SITE_OTHER): Payer: PPO | Admitting: Internal Medicine

## 2019-10-04 DIAGNOSIS — J84112 Idiopathic pulmonary fibrosis: Secondary | ICD-10-CM

## 2019-10-04 LAB — PULMONARY FUNCTION TEST
DL/VA % pred: 91 %
DL/VA: 3.6 ml/min/mmHg/L
DLCO cor % pred: 71 %
DLCO cor: 17.31 ml/min/mmHg
DLCO unc % pred: 69 %
DLCO unc: 16.69 ml/min/mmHg
FEF 25-75 Pre: 2.75 L/sec
FEF2575-%Pred-Pre: 135 %
FEV1-%Pred-Pre: 95 %
FEV1-Pre: 2.74 L
FEV1FVC-%Pred-Pre: 113 %
FEV6-%Pred-Pre: 89 %
FEV6-Pre: 3.33 L
FEV6FVC-%Pred-Pre: 106 %
FVC-%Pred-Pre: 83 %
FVC-Pre: 3.35 L
Pre FEV1/FVC ratio: 82 %
Pre FEV6/FVC Ratio: 99 %

## 2019-10-04 NOTE — Progress Notes (Signed)
Spirometry and Dlco done today. 

## 2019-10-04 NOTE — Research (Signed)
IPF-PRO Registry  Purpose: To collect data and biological samples that will support future research studies.  The goal of the registry is to research the current approaches to diagnosis, treatment, and progression of IPF. In addition, the registry will analyze participant characteristics, assess quality of life, describe participants interactions with the health care system, determine IPF treatment practices across multiple institutions, and utilize biological samples to identify disease biomarkers.   Clinical Research Coordinator / Research RN note : This visit for [name] with DOB: 10-10-1940 on 10/04/2019 for the above protocol is Visit/Encounter #30 Month Follow-up and is for purpose of research. The consent for this encounter is under Protocol Version Amendment 4 ([id number]) and is currently IRB approved. Subject expressed continued interest and consent in continuing as a study subject. Subject confirmed that there was no change in contact information (e.g. address, telephone, email). Subject thanked for participation in research and contribution to science. Please refer to the subject's paper source binder for additional information on this visit.   Signed by  [place] Assistant [facility]  [place], [place] 10:23 AM 10/04/2019

## 2019-10-16 ENCOUNTER — Encounter: Payer: Self-pay | Admitting: Internal Medicine

## 2019-10-16 ENCOUNTER — Ambulatory Visit: Payer: PPO | Admitting: Internal Medicine

## 2019-10-16 ENCOUNTER — Other Ambulatory Visit: Payer: Self-pay

## 2019-10-16 VITALS — BP 142/70 | HR 91 | Ht 70.0 in | Wt 173.8 lb

## 2019-10-16 DIAGNOSIS — J84112 Idiopathic pulmonary fibrosis: Secondary | ICD-10-CM

## 2019-10-16 DIAGNOSIS — T50905A Adverse effect of unspecified drugs, medicaments and biological substances, initial encounter: Secondary | ICD-10-CM | POA: Diagnosis not present

## 2019-10-16 DIAGNOSIS — R110 Nausea: Secondary | ICD-10-CM | POA: Diagnosis not present

## 2019-10-16 DIAGNOSIS — R634 Abnormal weight loss: Secondary | ICD-10-CM | POA: Diagnosis not present

## 2019-10-16 DIAGNOSIS — Z5181 Encounter for therapeutic drug level monitoring: Secondary | ICD-10-CM

## 2019-10-16 DIAGNOSIS — K589 Irritable bowel syndrome without diarrhea: Secondary | ICD-10-CM | POA: Diagnosis not present

## 2019-10-16 LAB — HEPATIC FUNCTION PANEL
ALT: 23 U/L (ref 0–53)
AST: 24 U/L (ref 0–37)
Albumin: 4.5 g/dL (ref 3.5–5.2)
Alkaline Phosphatase: 77 U/L (ref 39–117)
Bilirubin, Direct: 0.1 mg/dL (ref 0.0–0.3)
Total Bilirubin: 0.4 mg/dL (ref 0.2–1.2)
Total Protein: 7 g/dL (ref 6.0–8.3)

## 2019-10-16 NOTE — Progress Notes (Signed)
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 He tells me that this summer 2018*noticing insidious onset of shortness of breath particularly with walking stairs in the house or [name]. This was not that in the previous years. The summer 2018 he was in [place] but did not notice much shortness of breath. Otherwise he is able to do his activities of daily living and played golf with [name] car. He is more bothered by his back pain and sciatica as [name] result of previous back surgery. Shortness of breath is extremely mild. He says he became more aware of it after the radiologic investigations documented below. He had [name] chest x-ray that suggested interstitial findings and therefore he underwent [name] high-resolution CT scan of the chest that is described below. I personally visualized this high resolution CT chest and to me it shows bilateral bibasal subpleural reticulation that also extends to the upper lobes without any zonal predominance. There is no obvious honeycombing but there is traction bronchiectasis. There no mediastinal adenopathy. Therefore he is been referred here. In terms of exposure history other than radiation exposure he has not been on any pulmonary toxic drugs or any mold exposure or asbestos exposure. Does have GERD and is on PPI and is well ocntrolled   Does have spring and perennial allergies - sneezes easy. Allergy test with [facility] negative some years ago  [employer] chest physicians interstitial lung disease questionnaire: He says that he does not cough. He is only trouble with dyspnea with strenuous exercise and it started 4 months ago. Past medical history significant for acid reflux. He has never smoked any tobacco or street drugs. Does not have any family history of lung disease. At  home he does not have any humidifier sound birds heart the water damage or mold. 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 He has standard radiation exposure while at work. The standard radiation for [name] radiologist. He does not take any pulmonary toxic drugs. House is 78 years old and he worries about mold. He sneezes if air is forced   Pulmonary function test shows isolated reduction in diffusion capacity to 62%. Walking desaturation test underneath her feet 3 laps on room air: Resting pulse ox 96%. Final pulse ox 95%. Heart rate resting was 92/m and went up to 109 minute.   Results for Kenneth [name], Kenneth [name] (MRN [id number]) as of 03/23/2017 10:04  Ref. Range 03/04/2017 09:13  FVC-Pre Latest Units: L 3.59  FVC-%Pred-Pre Latest Units: % 86  Results for Kenneth [name], Kenneth [name] (MRN [id number]) as of 03/23/2017 10:04  Ref. Range 03/04/2017 09:13  DLCO cor Latest Units: ml/min/mmHg 20.06  DLCO cor % pred Latest Units: % 63    Lungs/Pleura: There is subpleural reticulation with scattered ground-glass and traction bronchiolectasis, without [name] definite zonal predominance. No definitive honeycombing. Findings appear mildly progressive when radiographs dating back to 12/28/2008 are reviewed. Probable 5 mm subpleural lymph node along the right major fissure. No pleural fluid. Airway is unremarkable.      IMPRESSION: 1. Pulmonary parenchymal findings of interstitial lung disease which may be due to fibrotic nonspecific interstitial pneumonitis or, given slight progression over time, usual interstitial pneumonitis. 2. Aortic atherosclerosis (ICD10-170.0). Coronary artery calcification. 3. Aortic aneurysm NOS (ICD10-I71.9).  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 Recommend annual imaging followup by CTA or MRA. This recommendation follows 2010 ACCF/AHA/AATS/ACR/ASA/SCA/SCAI/SIR/STS/SVM Guidelines for the Diagnosis and Management of Patients with Thoracic  Aortic Disease. Circulation. 2010; 121: I097-D532. 4. Prominence of the right and left main pulmonary arteries can be seen with pulmonary arterial hypertension.   Electronically Signed   By: [name]   On: 02/17/2017 13:41     OV 03/23/2017  Chief Complaint  Patient presents with  . Follow-up    SOB on exertion. Other than that he has been doing about the same. Denies any cough or CP.   Here to discuss results. [name]. 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 In inteirm no new symptoms.  Autoimmune profile negative. They raise possibility of 2nd opinion.  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 Progression is based on CXR comparison since 2010; there is no CT and is only suspicion of progression  Results for Kenneth [name], Kenneth [name] (MRN [id number]) as of 03/23/2017 10:04  Ref. Range 12/08/2011 16:44 03/04/2017 13:47  Anit Nuclear Antibody(ANA) Latest Ref Range: NEGATIVE   NEG  Angiotensin-Converting Enzyme Latest Ref Range: 9 - 67 U/L  31  Cyclic Citrullin Peptide Ab Latest Units: Units  <16  ds DNA Ab Latest Units: IU/mL  <1  ENA RNP Ab Latest Ref Range: 0.0 - 0.9 AI  0.2  Endomysial Screen Latest Ref Range: NEGATIVE  NEGATIVE   Deamidated Gliadin Abs, IgG Latest Ref Range: <20 U/mL 4.1   Gliadin IgA Latest Ref Range: <20 U/mL 1.8   RA Latex Turbid. Latest Ref Range: <14 IU/mL  <14  Tissue Transglut Ab Latest Ref Range: <20 U/mL 3.9   Tissue Transglutaminase Ab, IgA Latest Ref Range: <20 U/mL 2.2   IgE (Immunoglobulin E), Serum Latest Ref Range: <115 kU/L  31  IgA Latest Ref Range: 68 - 379 mg/dL 132   SSA (Ro) (ENA) Antibody, IgG Latest Ref Range: <1.0 NEG AI   <1.0 NEG  SSB (La) (ENA) Antibody, IgG Latest Ref Range: <1.0 NEG AI   <1.0 NEG    OV 05/06/2017  Chief Complaint  Patient presents with  . Follow-up    Follow up today after beginning [place]. Pt has no c/o SOB,  cough, or CP. Has had some mild nausea from the [place] but other than that, he has been doing good on it.   ollow-up idiopathic pulmonary fibrosis mild severity  He is here to follow-up He is on the third week of his bed at 3 pills 3 times [name] day max dose. So far is tolerating it fine other than mild nausea. He does take after meals. He plans to meet with [facility] coordinator for medication support. He did visit [place] this past weekend and did have vomiting especially after consuming few etoh drinks at [name] [employer]. He does apply sunscreen [his daughter is [name] Paediatric nurse in [place]. He still complains about the fact that and is 79 year old home when the air conditioner comes on and they air blows out of the duct system he does sneeze 10 times and his feet does feel cold. She plans to put hepa filters that are high and and also get the duct cleaning. He says he did have allergy evaluation and found few years ago and it was negative. Recently he has increased his rpinrole and alsogot an injection for his back  and he does feel better   OV 06/22/2017  Chief Complaint  Patient presents with  . Follow-up    Pt still taking Esbriet and has been doing good except has been having issues with nausea x3 days. PFT done today. Still becomes SOB with exertion. Denies any complaints of cough or CP.   IPF followup  Routine followup. Now on esbriet 3 pills tid since end oct 2018. Having new nausea - moderate, x 3 days. Might be chills. But no associated diarrhea or other side effects. Spacing esbriet 4h only. Also has complaitns about high co pay. Unable to find charity. Doing exrcise with trainer - feeels that is better and more vigorous than rehab. Gets tachy with eercise but says he does not desaturate. Had PFT - stable. Lot of questions about disease    Walking desaturation test on 06/22/2017 185 feet x 3 laps on ROOM AIR:  did NOT desaturate. Rest pulse ox was 100%, final pulse ox was 99%. HR  response 84/min at rest to 97/min at peak exertion. Patient Kenneth [name]  Did not Desaturate < 88% . Kenneth [name] did not  Desaturated </= 3% points. Randy [name] [place] yes did get tachyardic   OV 09/20/2017  Chief Complaint  Patient presents with  . Follow-up    PFT done today.  Pt states he has been doing good. Pt still taking Esbriet and states he is doing good on it.   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  Joen Laura for IPF follow-up.  Last visit December 2018.  Since then he has been compliant fully with his Pirfenidone (Esbriet).  His nausea resolved with Ginger intake.  However he is having 3-5 pound weight loss and also fatigue towards the end of the day and also low appetite.  He is exercising vigorously 5 times [name] week and he is wondering if the fatigue could be because of the heavy exercise.  He is not interested in lung transplantation.  He is open to research participation in interventional trial.  Currently he is on the IPF registry program.  Overall he is well.  He did go to [place] to visit his son and he did not wear sunscreen and he did pick up [name] stage I skin burn with erythema that was more than usual.  But this resolved.  I did remind him about his obligation to wear sunscreen at all times with Pirfenidone (Esbriet).  He is interested in interventional research trials.  His pulmonary function test to me on an average is stable even though there is decline in FVC that seems to be an increase in DLCO so that is variability.  His walking desaturation test is around the same. He is interested in rolling over to the larger capsule of the Pirfenidone (Esbriet) so we will have to take it only 3 times [name] day. ESberit is cposting him $500/month or more due to high AGI and this is frustrating for him   Walking desaturation test on 09/20/2017 185 feet x 3 laps on ROOM AIR:  did walk briskly all 3 laps with only mild dyspnea desaturate. Rest pulse ox was 98%, final pulse ox was 98%. HR response 96/min at rest to  107/min at peak exertion. Patient Kenneth [name]  Did not Desaturate < 88% . Kenneth [name] did not  Desaturated </= 3% points. [place] yes did get tachyardic this appears similar to [name] few months ago   OV 12/07/2017  Chief Complaint  Patient presents with  .  Follow-up    Pt had pre Arlyce Harman and DLCO prior to OV today. Pt has increase of SOB with exertion more with walking up the hill.   Dr. Annamaria Boots presents for follow-up of his IPF to ILD clinic.  In terms of his IPF he feels stable.  In fact he tells me that if not for the incidental chest x-ray and [name] subsequent CT scan that picked up pulmonary fibrosis he even to this day will not know that he has IPF.  He only has [name] mild exertional dyspnea class I which she always believes in the past that it was because of aging.  At this point in time he is on Esbriet 1 big pill 3 times daily at full dose.  He is now applying sunscreen and has not had any further sunburns.  He is following an extremely intense exercise fitness regimen to improve his cardiac conditioning.  He believes his exertional heart rate and resting heart rate are much improved than before.  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  His main issue is from Pirfenidone [employer]).  He had some mild nausea that has now resolved with ginger capsules but he continues to have some amount of fatigue low appetite and some mild weight loss.  He says this is mild and all tolerable.  The fatigue happens after working [name] lot in the yard and he loves yard work.  He wants to know if he can continue to do yard work.  Recently he did have some sinus congestion and took some steroids and antibiotics for it and he feels better but he still has some sinus fullness.  He had spirometry and simple walking desaturation test and the profile is below and I believe these are stable.  In fact his exertional heart rate is improved compared to previous.  Today he also participated in the IPF registry program.  There is [name]  noninterventional trial.  He is interested in future interventional trials.   OV 03/08/2018  Chief Complaint  Patient presents with  . Follow-up    PFT performed today. Pt states he has been doing well except he has been having problems with nausea and dizziness, and loss of appetite.   Kenneth [name] - presents for follow-up. For his IPF. He is on esbruet now. He was tolerating his Pirfenidone (Esbriet) quite well other than minor side effects till last visit. However in thesummer of 2019e's had increased nauseaand also weight loss. He has lost 6 pounds since prior visit. Overall he is lost 11 pounds since September 2018. We started the medication esbeit  anti-fibrotic for him in October 2018. His pants are getting looser.he is also having significant fatigue. Nevertheless he does not want to switch to the of anti-fibrotic ofev because of prior history of irritable bowel syndrome. He says that his baseline irritable bowel syndrome is worse than any side effects he is having with esbriet especially the fatigue.e is open to [name] transplant evaluation and consideration of research protocols. His spirometry shows stability with FVC but his DLCO might be declining.Marland Kitchen His simple walk test did not show anychange. He is not noticing any subjective changes in dyspnea on effort tolerance while working out although he does admit to increased dizziness particularly when playing golf. During this time recently has not monitored his pulse ox but in the past and never went below 92%.  Other issues: He had some myalgia despite change in his statin. He will address this with his  primary care. Also CT scan [name] year ago showed thoracic aortic dilatiion. Primary care has ordered [name] follow-up CT scan.This can be combined with [name] high risk CT scan. I I have reached out to  the thoracic radiology     OV 05/10/2018  Subjective:  Patient ID: Kenneth [name], male , DOB: [birth year] , age 32 y.o. , MRN: [id number] , ADDRESS: [place] [id number]   05/10/2018 -   Chief Complaint  Patient presents with  . Follow-up    review spiro with dlco.  c/o stable doe.  On 848m Esbriet TID, notes occ dizziness, loss of appetite.      HPI Kenneth CHMIEL89y.o. -returns-year follow-up.  Diagnosis made towards the end of 2018.  He has been on Esbriet since then.  His main issues have been weight loss.  He lost 10 pounds of weight at the time of last visit.  At the last visit he weighed 174 pounds in September 2019.  Back in August 2018 he weighed 186 pounds.  All this weight loss happen after he started Pirfenidone (Esbriet).  However he is now stabilized and he continues to be 874 pounds.  He is able to do activities of daily living and exercise but when he does stairs he gets dyspneic.  Overall he feels stable in the last 1 year in terms of his effort tolerance.  His main thing is that he has no appetite.  He has been communicating with the Pirfenidone (Esbriet) supporting about how to manage his low appetite and occasional nausea.  We discussed the options about taking brief holidays or reduction in dose.  He is willing to try this.  He does take occasional ginger.  Of note he had [name] transplant [facility] because he was out of network.  He tells me that his insurance company only advised that he would have [name] higher co-pay so is [name] little bit puzzled by this.  Since then he said conversation with his primary care physician and he wants to see Dr. DShana Chutewho is [name] son-in-law to his friend Dr. BLindwood Quaretired sPsychologist, sport and exercise  I know Dr. GLauris Chromanquite well from an IPF standpoint.  And therefore wholeheartedly supported the idea.  In terms of his lung function testing his FVC shows [name] decline although he is feeling the same.  On miniature walking test he seems [name] little more tachycardic but he says he feels the same.  He manages this heart rate with his exercise.  His  DLCO with some variability appears stable.  He had [name] CT angiogram for thoracic aortic aneurysm in September 2019 and this when compared to one year earlier has been reported as stable but the different resolutions.  I offered to do [name] high-resolution CT chest here but he wants to hold off until he saw Dr. GLauris Chroman  OV 08/23/2018  Subjective:  Patient ID: KWestley [name] male , DOB: [birth year], age 79y.o. , MRN: [id number], [place] [id number]  08/23/2018 -   Chief Complaint  Patient presents with  . Follow-up    PFT performed today. Pt states he has been doing well since last visit and denies any complaints.   IPF followup"   Diagnosis made towards the end of 2018.  He has been on Esbriet since then.   HPI KNOLE ROBEY744y.o. -Dr.  Ronny Flurry presents for follow-up of his IPF.  He continues to feel good.  His main issue is tolerating the Esbriet which gives some fatigue.  Sometimes he has to postpone it.  In terms of his dyspnea he continues to exercise.  His dyspnea symptom score is really minimal.  His cough is nonexistent.  He was losing weight with Esbriet but now he is gained his weight back.  In the interim he did see Dr. Shana Chute June 23, 2018 at [facility].  He had [name] satisfactory visit.  He thought he was stable.  His next visit with Dr. Lauris Chroman will be in June 2020.  Moving forward he wants to alternate every 6 months between myself and Dr. Lauris Chroman at [facility] which will give him 3 months with 1 pulmonologist or the other.  He is beginning to get interested in research studies.  We did pulmonary function test on him today and is documented below.  The FVC appears [name] fluctuant but the DLCO definitely appears on [name] downward trend.  The new DLCO is on [name] global lung initiated [GL I] equation and therefore the percent predicted is higher.  But the absolute value showed downward trend line.  We went over this.  He definitely does not feel this subjectively.  His  last high-resolution CT scan of the chest was in August of 2018.  He is willing to have one at follow-up.  He wants to see me again in 6 months.  His simple walking desaturation test appears stable.  He is thinking of making [name] trip to [place] and wants to know if he should wear [name] mask.    OV 04/21/2019  Subjective:  Patient ID: Kenneth [name], male , DOB: 1940-11-25 , age 52 y.o. , MRN: [id number] , ADDRESS: [place] [address]   04/21/2019 -   Chief Complaint  Patient presents with  . Follow-up    Patient reports that his breathing is doing well at this time.    IPF followup. On esbriet  HPI Kenneth [name] 79 y.o. -presents for follow-up of his idiopathic pulmonary fibrosis.  Last visit was pre-pandemic in February 2018.  In the interim he did see [facility] and deemed to be stable.  At this point in time he is reporting continued stability with the symptoms as seen by the score below.  However I did notice that he is lost lot of weight.  His BMI with his clothes on is 25 with [name] weight of 170 pounds.  He says his dry weight is actually around 162 pounds.  He feels that this is the stable weight in the last 6 months.  He says he is not bothered by it.  He says this is because of low appetite because of the and pirfenidone.  He feels the pirfenidone is benefiting him and he continues to be is to be stable.  In fact his pulmonary function test shows " improvement".  He said that he felt the test today was somewhat variable and is effort based on the technician.  He had [name] recent high-resolution CT chest that shows continued stability.  I personally visualized this film.  At this point in time he is happy taking the pirfenidone.  But he did agree if there is further weight loss into the 150s pounds then we could reassess  Had [name] lot of discussion about COVID-19 and about appropriate risk reduction measures which he is continuing to  do.  He did some read some  literature about COVID-19 and had questions about it. He in Vit D supplementation and is agreeable to get his levels checked following literature about protective effect of Vit D  Other than that he reports 2 episodes of dizziness and in one of those he nearly passed out.  He was wondering if some of the symptoms were related to ropinirole and therefore he has stopped it.  I reviewed the literature with him and the significant amount of dizziness and hypotension.  And also GI symptoms which can probably accentuated with pirfenidone.   OV 10/16/2019  Subjective:  Patient ID: Kenneth [name], male , DOB: 07/04/1941 , age 37 y.o. , MRN: [id number] , ADDRESS: [place] [id number]   10/16/2019 -   Chief Complaint  Patient presents with  . Follow-up    PFT performed 3/31.  Pt states he is doing okay. States his breathing is about the same since last visit.     HPI Kenneth [name] 79 y.o.  -follow-up IPF on pirfenidone    -ROS   31-monthfollow-up for Dr. YAnnamaria Boots  He continues on pirfenidone.  He is taking full dose.  In the interim he has had hernia surgery 6 weeks ago for right inguinal hernia.  This is gone well.  He is change his exercise from bicycle to walking because of the hernia.  His symptom score is the same.  [facility].  Deemed to be stable.  I reviewed that note.  After that he has had pulmonary function test with uKoreacouple weeks ago.  This shows [name] decline.  He says the quality of instructions was poor and therefore the decline is because of that.  At least that is his suspicion.  He feels well.  Walking desaturation test is stable.  He has gained some weight.  He continues to have some nausea and fatigue and diarrhea with the pirfenidone.  But this is stable and manageable.  He feels his irritable bowel syndrome is active because of the pirfenidone.  He plans to see Dr. JJoanell Risingfor that.  His last liver  function test was with Dr. GLauris Chromanin January 2021.  This was normal per the note.  His last CT scan was towards the end of 2020 and deemed to be stable.    SYMPTOM SCALE - ILD 08/23/2018  04/21/2019  10/16/2019   O2 use ra e ra  Shortness of Breath 0 -> 5 scale with 5 being worst (score 6 If unable to do)    At rest 0 0 0  Simple tasks - showers, clothes change, eating, shaving *0** 0 0  Household (dishes, doing bed, laundry) 0 0 0  Shopping 0 0 0  Walking level at own pace 0 0 0  Walking up Stairs _0 Total (40 - 48) Dyspnea Score _1 How bad is your cough? 0 0 0  How bad is your fatigue 1 due to esbriet 0 2  nausea   1  vomit   0  diarrhea   1  anxiety   0  depresso0   0        Simple office walk 185 feet x  3 laps goal with forehead probe 12/07/2017 Weight 180# 03/08/2018 Weight 174.9 05/10/2018 Weight 174# 08/23/2018 Weight 178# 04/21/2019 Weight 170# 10/16/2019 173#  O2 used Room air Room  air Room air Room air Room air Room air  Number laps completed _0 Comments about pace Moderate pace Mod pace Mod fast pace fast    Resting Pulse Ox/HR 100% and 83/min 100% ad 85/min 100% and 91/min 100% and 82/min  100% asnd 91  Final Pulse Ox/HR 99% and 98/min 98$ and 104/min 98% and 108/min 98% and 100/min  96% and 106/mom  Desaturated </= 88% no no no no  no  Desaturated <= 3% points no no ni no  yesm 4 points  Got Tachycardic >/= 90/min yes yes yes yes  yes  Symptoms at end of test No complaints No complaints  No complaints  Mild dyspnea  Miscellaneous comments x ? sligh increase in tachy/stable ? incraeased tachty        Results for Kenneth [name], BORQUEZ (MRN [id number]) as of 10/16/2019 09:07  Ref. Range 03/04/2017 09:13 06/22/2017 09:14 09/20/2017 08:54 12/07/2017 13:49 03/08/2018 10:15 05/10/2018 13:59 08/23/2018 09:29 04/21/2019 11:06 10/04/2019 09:32  FVC-Pre Latest Units: L 3.59 3.70 3.47 3.54 3.56 3.27 3.19 3.59 3.35  FVC-%Pred-Pre Latest Units: % 86 89 84 86 86 84 82 90  83  Results for Kenneth [name], Kenneth [name] (MRN [id number]) as of 10/16/2019 09:07  Ref. Range 03/04/2017 09:13 06/22/2017 09:14 09/20/2017 08:54 12/07/2017 13:49 03/08/2018 10:15 05/10/2018 13:59 08/23/2018 09:29 04/21/2019 11:06 10/04/2019 09:32  DLCO unc Latest Units: ml/min/mmHg 20.12 19.77 24.06 21.20 19.63 19.04 18.61 20.89 16.69  DLCO unc % pred Latest Units: % 63 62 76 66 62 64 79 86 69     has [name] past medical history of Arthritis, Diverticulosis of colon (without mention of hemorrhage), GERD (gastroesophageal reflux disease), Hiatal hernia, HLD (hyperlipidemia), Internal hemorrhoids without mention of complication, Irritable bowel syndrome, Other specified gastritis without mention of hemorrhage, and Pulmonary fibrosis ([place]).   reports that he has never smoked. He has never used smokeless tobacco.  Past Surgical History:  Procedure Laterality Date  . CARPAL TUNNEL RELEASE     left  . CATARACT EXTRACTION  06/2011   bilateral  . INGUINAL HERNIA REPAIR Right 09/05/2019   Procedure: OPEN REPAIR RIGHT INGUINAL HERNIA WITH MESH;  Surgeon: Armandina Gemma, MD;  Location: [facility];  Service: General;  Laterality: Right;  . [place]   x 2  . LUMBAR LAMINECTOMY/DECOMPRESSION MICRODISCECTOMY Left 07/11/2013   Procedure: LUMBAR ONE TO TWO, LUMBAR TWO TO THREE, LUMBAR THREE TO FOUR LUMBAR LAMINECTOMY/DECOMPRESSION MICRODISCECTOMY 3 LEVELS;  Surgeon: Charlie Pitter, MD;  Location: [facility];  Service: Neurosurgery;  Laterality: Left;  . NASAL SINUS SURGERY      No Known Allergies  Immunization History  Administered Date(s) Administered  . Influenza, High Dose Seasonal PF 03/06/2017, 04/09/2018, 03/07/2019  . Influenza,inj,Quad PF,6+ Mos 04/05/2016  . Influenza-Unspecified 04/05/2016  . PFIZER SARS-COV-2 Vaccination 07/20/2019, 08/10/2019  . Pneumococcal Conjugate-13 04/26/2018  . Pneumococcal-Unspecified 04/05/2016  . Zoster Recombinat (Shingrix) 01/03/2017    Family History  Problem  Relation Age of Onset  . Heart failure Father 83  . Colon cancer Neg Hx   . Pancreatic cancer Neg Hx   . Rectal cancer Neg Hx   . Stomach cancer Neg Hx      Current Outpatient Medications:  .  acetaminophen-codeine (TYLENOL #3) 300-30 MG per tablet, Take 1 tablet by mouth every 8 (eight) hours as needed for moderate pain. , Disp: , Rfl:  .  aspirin EC 81 MG tablet, Take 81 mg by mouth 3 (  three) times [name] week., Disp: , Rfl:  .  carisoprodol (SOMA) 350 MG tablet, Take 175 mg by mouth 3 (three) times daily as needed for muscle spasms. , Disp: , Rfl:  .  Cholecalciferol (VITAMIN D-3) 1000 UNITS CAPS, Take 1,000 Units by mouth every evening. , Disp: , Rfl:  .  CO-ENZYME Q-10 PO, Take 1 tablet by mouth every evening. , Disp: , Rfl:  .  diclofenac (VOLTAREN) 75 MG EC tablet, Take 75 mg by mouth 2 (two) times daily as needed (pain.). , Disp: , Rfl: 2 .  diclofenac sodium (VOLTAREN) 1 % GEL, Apply 4 g topically 4 (four) times daily as needed (pain.). , Disp: , Rfl: 12 .  dicyclomine (BENTYL) 10 MG capsule, Take one by mouth every 6 hours as needed for abdominal cramping (Patient taking differently: Take 10 mg by mouth every 6 (six) hours as needed (abdominal spasms.). ), Disp: 30 capsule, Rfl: 1 .  diphenoxylate-atropine (LOMOTIL) 2.5-0.025 MG tablet, Take 1 tablet by mouth 4 (four) times daily as needed for diarrhea or loose stools. , Disp: , Rfl:  .  ESBRIET 801 MG TABS, Take 1 tablet by mouth three times [name] day with food., Disp: 90 tablet, Rfl: 1 .  esomeprazole (NEXIUM) 40 MG capsule, Take 40 mg by mouth every evening. , Disp: , Rfl:  .  ezetimibe (ZETIA) 10 MG tablet, Take 10 mg by mouth daily., Disp: , Rfl:  .  Fluticasone Propionate (XHANCE) 93 MCG/ACT EXHU, Place 1 spray into the nose 2 (two) times daily., Disp: , Rfl:  .  Glucosamine Sulfate 500 MG TABS, Take 500 mg by mouth daily., Disp: , Rfl:  .  HYDROcodone-acetaminophen (NORCO/VICODIN) 5-325 MG tablet, Take 1-2 tablets by mouth every 4  (four) hours as needed for moderate pain., Disp: 24 tablet, Rfl: 0 .  loratadine (CLARITIN) 10 MG tablet, Take 10 mg by mouth daily., Disp: , Rfl:  .  meclizine (ANTIVERT) 25 MG tablet, Take 25 mg by mouth 3 (three) times daily as needed for dizziness. , Disp: , Rfl:  .  ondansetron (ZOFRAN-ODT) 8 MG disintegrating tablet, Take 8 mg by mouth every 6 (six) hours as needed for nausea/vomiting., Disp: , Rfl:       Objective:   Vitals:   10/16/19 0901  BP: (!) 142/70  Pulse: 91  SpO2: 100%  Weight: 173 lb 12.8 oz (78.8 kg)  Height: _0  (1.778 m)    Estimated body mass index is 24.94 kg/m as calculated from the following:   Height as of this encounter: _1  (1.778 m).   Weight as of this encounter: 173 lb 12.8 oz (78.8 kg).  _2 @  [employer]   10/16/19 0901  Weight: 173 lb 12.8 oz (78.8 kg)     Physical Exam Alert and oriented x3.  Nonfocal exam except for bilateral bibasal crackles.  No stigmata of connective tissue disease.  No clubbing.      Assessment:       ICD-10-CM   1. 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 Encounter for therapeutic drug monitoring  Z51.81   3. Drug-induced weight loss  R63.4    T50.905A   4. Irritable bowel syndrome, unspecified type  K58.9   5. 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[place])  - clinically stable but PFT down in march 2021 which is likely technicall  Plan  -  repeat spirometry and dlco in 4-5 months  - if there is sustained progression we can discuss change to ofev or adding trials such as promedior infusion PRM-150  Encounter for therapeutic drug monitoring  - check LFT 10/16/2019    Drug-induced weight loss  - seems to have stabilized and better  Plan   - continue monitoring  Irritable bowel syndrome, unspecified type Nausea  -0 this is mild and intermittent  - esbriet probably contributing but you are managing given  beneficial effect of drug with IPF   Followup  - 4-5 months ILD clinic 30 min slot but after spiro/dlco  - plese feel free to reach out to me after [facility] visit in June 2021  ( Level 05 visit: 0Estb 40-54 min  in  visit type: on-site physical face to visit  in total care time and counseling or/and coordination of care by this undersigned MD - Dr Brand Males. This includes one or more of the following on this same day 10/16/2019: pre-charting, chart review, note writing, documentation discussion of test results, diagnostic or treatment recommendations, prognosis, risks and benefits of management options, instructions, education, compliance or risk-factor reduction. It excludes time spent by the [place] or office staff in the care of the patient. Actual time 21 min)    SIGNATURE    Dr. Brand Males, M.D., F.C.C.P,  Pulmonary and Critical Care Medicine Staff Physician, [place] Director - Interstitial Lung Disease  Program  [facility] [place], [place], [address]  Pager: [phone], If no answer or between  15:00h - 7:00h: call [phone] Telephone: [phone]  9:42 AM 10/16/2019

## 2019-10-16 NOTE — Progress Notes (Signed)
LFT [facility]  Lab             10/16/19                      0946         AST          24           ALT          23           ALKPHOS      77           BILITOT      0.4          PROT         7.0          ALBUMIN      4.5

## 2019-10-16 NOTE — Patient Instructions (Addendum)
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[place])  - clinically stable but PFT down in march 2021 which is likely technicall  Plan  - repeat spirometry and dlco in 4-5 months  - if there is sustained progression we can discuss change to ofev or adding trials such as promedior infusion PRM-150  Encounter for therapeutic drug monitoring  - check LFT 10/16/2019    Drug-induced weight loss  - seems to have stabilized and better  Plan   - continue monitoring  Irritable bowel syndrome, unspecified type Nausea  -0 this is mild and intermittent  - esbriet probably contributing but you are managing given beneficial effect of drug with IPF   Followup  - 4-5 months ILD clinic 30 min slot but after spiro/dlco  - plese feel free to reach out to me after [facility] visit in June 2021

## 2019-10-17 NOTE — Progress Notes (Signed)
Patient identification verified. Normal LFT results provided. Patient verbalized understanding of results.

## 2019-10-23 ENCOUNTER — Other Ambulatory Visit: Payer: Self-pay | Admitting: Pharmacist

## 2019-10-23 DIAGNOSIS — Z5181 Encounter for therapeutic drug level monitoring: Secondary | ICD-10-CM

## 2019-10-23 DIAGNOSIS — J84112 Idiopathic pulmonary fibrosis: Secondary | ICD-10-CM

## 2019-10-23 MED ORDER — ESBRIET 801 MG PO TABS: 1.0000 | ORAL_TABLET | Freq: Three times a day (TID) | ORAL | 2 refills | Status: DC

## 2019-10-23 NOTE — Progress Notes (Signed)
Received refill request from [facility].  Most recent hepatic panel WNL on 10/16/19.  Prescription sent to [facility]. Due for repeat hepatic panel in July.  Future order placed.   Mariella Saa, PharmD, [place], [facility] Pharmacist [phone]  10/23/2019 1:54 PM

## 2020-01-11 DIAGNOSIS — E7849 Other hyperlipidemia: Secondary | ICD-10-CM | POA: Diagnosis not present

## 2020-01-11 DIAGNOSIS — Z125 Encounter for screening for malignant neoplasm of prostate: Secondary | ICD-10-CM | POA: Diagnosis not present

## 2020-01-15 DIAGNOSIS — K449 Diaphragmatic hernia without obstruction or gangrene: Secondary | ICD-10-CM | POA: Diagnosis not present

## 2020-01-15 DIAGNOSIS — I712 Thoracic aortic aneurysm, without rupture: Secondary | ICD-10-CM | POA: Diagnosis not present

## 2020-01-15 DIAGNOSIS — Z1212 Encounter for screening for malignant neoplasm of rectum: Secondary | ICD-10-CM | POA: Diagnosis not present

## 2020-01-15 DIAGNOSIS — K589 Irritable bowel syndrome without diarrhea: Secondary | ICD-10-CM | POA: Diagnosis not present

## 2020-01-15 DIAGNOSIS — Z79899 Other long term (current) drug therapy: Secondary | ICD-10-CM | POA: Diagnosis not present

## 2020-01-15 DIAGNOSIS — E7849 Other hyperlipidemia: Secondary | ICD-10-CM | POA: Diagnosis not present

## 2020-01-15 DIAGNOSIS — R0602 Shortness of breath: Secondary | ICD-10-CM | POA: Diagnosis not present

## 2020-01-15 DIAGNOSIS — Z1331 Encounter for screening for depression: Secondary | ICD-10-CM | POA: Diagnosis not present

## 2020-01-15 DIAGNOSIS — J849 Interstitial pulmonary disease, unspecified: Secondary | ICD-10-CM | POA: Diagnosis not present

## 2020-01-15 DIAGNOSIS — I2510 Atherosclerotic heart disease of native coronary artery without angina pectoris: Secondary | ICD-10-CM | POA: Diagnosis not present

## 2020-01-15 DIAGNOSIS — R7301 Impaired fasting glucose: Secondary | ICD-10-CM | POA: Diagnosis not present

## 2020-01-15 DIAGNOSIS — J84112 Idiopathic pulmonary fibrosis: Secondary | ICD-10-CM | POA: Diagnosis not present

## 2020-01-15 DIAGNOSIS — R972 Elevated prostate specific antigen [PSA]: Secondary | ICD-10-CM | POA: Diagnosis not present

## 2020-01-15 DIAGNOSIS — K219 Gastro-esophageal reflux disease without esophagitis: Secondary | ICD-10-CM | POA: Diagnosis not present

## 2020-01-15 DIAGNOSIS — R05 Cough: Secondary | ICD-10-CM | POA: Diagnosis not present

## 2020-01-15 DIAGNOSIS — Z0000 Encounter for general adult medical examination without abnormal findings: Secondary | ICD-10-CM | POA: Diagnosis not present

## 2020-01-15 DIAGNOSIS — R82998 Other abnormal findings in urine: Secondary | ICD-10-CM | POA: Diagnosis not present

## 2020-01-15 DIAGNOSIS — M48061 Spinal stenosis, lumbar region without neurogenic claudication: Secondary | ICD-10-CM | POA: Diagnosis not present

## 2020-01-15 DIAGNOSIS — M1990 Unspecified osteoarthritis, unspecified site: Secondary | ICD-10-CM | POA: Diagnosis not present

## 2020-01-23 ENCOUNTER — Other Ambulatory Visit: Payer: Self-pay | Admitting: *Deleted

## 2020-01-23 DIAGNOSIS — J84112 Idiopathic pulmonary fibrosis: Secondary | ICD-10-CM

## 2020-01-23 MED ORDER — ESBRIET 801 MG PO TABS: 1.0000 | ORAL_TABLET | Freq: Three times a day (TID) | ORAL | 11 refills | Status: DC

## 2020-02-27 DIAGNOSIS — R351 Nocturia: Secondary | ICD-10-CM | POA: Diagnosis not present

## 2020-02-27 DIAGNOSIS — R972 Elevated prostate specific antigen [PSA]: Secondary | ICD-10-CM | POA: Diagnosis not present

## 2020-03-27 DIAGNOSIS — D0439 Carcinoma in situ of skin of other parts of face: Secondary | ICD-10-CM | POA: Diagnosis not present

## 2020-03-27 DIAGNOSIS — D485 Neoplasm of uncertain behavior of skin: Secondary | ICD-10-CM | POA: Diagnosis not present

## 2020-03-27 DIAGNOSIS — D1801 Hemangioma of skin and subcutaneous tissue: Secondary | ICD-10-CM | POA: Diagnosis not present

## 2020-03-27 DIAGNOSIS — L570 Actinic keratosis: Secondary | ICD-10-CM | POA: Diagnosis not present

## 2020-03-27 DIAGNOSIS — C44519 Basal cell carcinoma of skin of other part of trunk: Secondary | ICD-10-CM | POA: Diagnosis not present

## 2020-03-27 DIAGNOSIS — L821 Other seborrheic keratosis: Secondary | ICD-10-CM | POA: Diagnosis not present

## 2020-03-27 DIAGNOSIS — Z85828 Personal history of other malignant neoplasm of skin: Secondary | ICD-10-CM | POA: Diagnosis not present

## 2020-03-28 DIAGNOSIS — M4726 Other spondylosis with radiculopathy, lumbar region: Secondary | ICD-10-CM | POA: Diagnosis not present

## 2020-04-06 DIAGNOSIS — Z23 Encounter for immunization: Secondary | ICD-10-CM | POA: Diagnosis not present

## 2020-04-11 ENCOUNTER — Ambulatory Visit (INDEPENDENT_AMBULATORY_CARE_PROVIDER_SITE_OTHER): Payer: PPO | Admitting: Internal Medicine

## 2020-04-11 ENCOUNTER — Encounter: Payer: PPO | Admitting: *Deleted

## 2020-04-11 ENCOUNTER — Other Ambulatory Visit: Payer: Self-pay

## 2020-04-11 ENCOUNTER — Ambulatory Visit: Payer: PPO | Admitting: Internal Medicine

## 2020-04-11 ENCOUNTER — Encounter: Payer: Self-pay | Admitting: Internal Medicine

## 2020-04-11 VITALS — BP 120/64 | HR 82 | Temp 97.6°F | Ht 70.0 in | Wt 168.4 lb

## 2020-04-11 DIAGNOSIS — J84112 Idiopathic pulmonary fibrosis: Secondary | ICD-10-CM

## 2020-04-11 DIAGNOSIS — R634 Abnormal weight loss: Secondary | ICD-10-CM | POA: Diagnosis not present

## 2020-04-11 DIAGNOSIS — K589 Irritable bowel syndrome without diarrhea: Secondary | ICD-10-CM

## 2020-04-11 DIAGNOSIS — Z5181 Encounter for therapeutic drug level monitoring: Secondary | ICD-10-CM

## 2020-04-11 LAB — HEPATIC FUNCTION PANEL
ALT: 23 U/L (ref 0–53)
AST: 23 U/L (ref 0–37)
Albumin: 4.6 g/dL (ref 3.5–5.2)
Alkaline Phosphatase: 70 U/L (ref 39–117)
Bilirubin, Direct: 0.1 mg/dL (ref 0.0–0.3)
Total Bilirubin: 0.6 mg/dL (ref 0.2–1.2)
Total Protein: 7.1 g/dL (ref 6.0–8.3)

## 2020-04-11 LAB — PULMONARY FUNCTION TEST
DL/VA % pred: 94 %
DL/VA: 3.73 ml/min/mmHg/L
DLCO cor % pred: 72 %
DLCO cor: 17.26 ml/min/mmHg
DLCO unc % pred: 72 %
DLCO unc: 17.26 ml/min/mmHg
FEF 25-75 Pre: 2.67 L/sec
FEF2575-%Pred-Pre: 134 %
FEV1-%Pred-Pre: 92 %
FEV1-Pre: 2.62 L
FEV1FVC-%Pred-Pre: 111 %
FEV6-%Pred-Pre: 87 %
FEV6-Pre: 3.23 L
FEV6FVC-%Pred-Pre: 106 %
FVC-%Pred-Pre: 82 %
FVC-Pre: 3.26 L
Pre FEV1/FVC ratio: 80 %
Pre FEV6/FVC Ratio: 99 %

## 2020-04-11 LAB — BASIC METABOLIC PANEL
BUN: 15 mg/dL (ref 6–23)
CO2: 33 mEq/L — ABNORMAL HIGH (ref 19–32)
Calcium: 9.5 mg/dL (ref 8.4–10.5)
Chloride: 98 mEq/L (ref 96–112)
Creatinine, Ser: 0.75 mg/dL (ref 0.40–1.50)
GFR: 87.58 mL/min (ref >60.00)
Glucose, Bld: 85 mg/dL (ref 70–99)
Potassium: 4.6 mEq/L (ref 3.5–5.1)
Sodium: 135 mEq/L (ref 135–145)

## 2020-04-11 LAB — IBC PANEL
Iron: 224 ug/dL — ABNORMAL HIGH (ref 42–165)
Saturation Ratios: 69.6 % — ABNORMAL HIGH (ref 20.0–50.0)
Transferrin: 230 mg/dL (ref 212.0–360.0)

## 2020-04-11 NOTE — Progress Notes (Signed)
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 He tells me that this summer 2018*noticing insidious onset of shortness of breath particularly with walking stairs in the house or [name]. This was not that in the previous years. The summer 2018 he was in [place] but did not notice much shortness of breath. Otherwise he is able to do his activities of daily living and played golf with [name] car. He is more bothered by his back pain and sciatica as [name] result of previous back surgery. Shortness of breath is extremely mild. He says he became more aware of it after the radiologic investigations documented below. He had [name] chest x-ray that suggested interstitial findings and therefore he underwent [name] high-resolution CT scan of the chest that is described below. I personally visualized this high resolution CT chest and to me it shows bilateral bibasal subpleural reticulation that also extends to the upper lobes without any zonal predominance. There is no obvious honeycombing but there is traction bronchiectasis. There no mediastinal adenopathy. Therefore he is been referred here. In terms of exposure history other than radiation exposure he has not been on any pulmonary toxic drugs or any mold exposure or asbestos exposure. Does have GERD and is on PPI and is well ocntrolled   Does have spring and perennial allergies - sneezes easy. Allergy test with [facility] negative some years ago  [employer] chest physicians interstitial lung disease questionnaire: He says that he does not cough. He is only trouble with dyspnea with strenuous exercise and it started 4 months ago. Past medical history significant for acid reflux. He has never smoked any tobacco or street drugs. Does not have any family history of lung disease. At home  he does not have any humidifier sound birds heart the water damage or mold. 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 He has standard radiation exposure while at work. The standard radiation for [name] radiologist. He does not take any pulmonary toxic drugs. House is 78 years old and he worries about mold. He sneezes if air is forced   Pulmonary function test shows isolated reduction in diffusion capacity to 62%. Walking desaturation test underneath her feet 3 laps on room air: Resting pulse ox 96%. Final pulse ox 95%. Heart rate resting was 92/m and went up to 109 minute.   Results for Kenneth [name] (MRN [id number]) as of 03/23/2017 10:04  Ref. Range 03/04/2017 09:13  FVC-Pre Latest Units: L 3.59  FVC-%Pred-Pre Latest Units: % 86  Results for Kenneth, [name] (MRN [id number]) as of 03/23/2017 10:04  Ref. Range 03/04/2017 09:13  DLCO cor Latest Units: ml/min/mmHg 20.06  DLCO cor % pred Latest Units: % 63    Lungs/Pleura: There is subpleural reticulation with scattered ground-glass and traction bronchiolectasis, without [name] definite zonal predominance. No definitive honeycombing. Findings appear mildly progressive when radiographs dating back to 12/28/2008 are reviewed. Probable 5 mm subpleural lymph node along the right major fissure. No pleural fluid. Airway is unremarkable.      IMPRESSION: 1. Pulmonary parenchymal findings of interstitial lung disease which may be due to fibrotic nonspecific interstitial pneumonitis or, given slight progression over time, usual interstitial pneumonitis. 2. Aortic atherosclerosis (ICD10-170.0). Coronary artery calcification. 3. Aortic aneurysm NOS (ICD10-I71.9). Small ascending  aortic aneurysm with aortic valvular calcification. Recommend annual imaging followup by CTA or MRA. This recommendation follows 2010 ACCF/AHA/AATS/ACR/ASA/SCA/SCAI/SIR/STS/SVM Guidelines for the Diagnosis and Management of Patients with Thoracic Aortic  Disease. Circulation. 2010; 121: M010-U725. 4. Prominence of the right and left main pulmonary arteries can be seen with pulmonary arterial hypertension.   Electronically Signed   By: [name]   On: 02/17/2017 13:41     OV 03/23/2017  Chief Complaint  Patient presents with  . Follow-up    SOB on exertion. Other than that he has been doing about the same. Denies any cough or CP.   Here to discuss results. [name]. 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 In inteirm no new symptoms.  Autoimmune profile negative. They raise possibility of 2nd opinion.  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 Progression is based on CXR comparison since 2010; there is no CT and is only suspicion of progression  Results for Kenneth, [name] (MRN [id number]) as of 03/23/2017 10:04  Ref. Range 12/08/2011 16:44 03/04/2017 13:47  Anit Nuclear Antibody(ANA) Latest Ref Range: NEGATIVE   NEG  Angiotensin-Converting Enzyme Latest Ref Range: 9 - 67 U/L  31  Cyclic Citrullin Peptide Ab Latest Units: Units  <16  ds DNA Ab Latest Units: IU/mL  <1  ENA RNP Ab Latest Ref Range: 0.0 - 0.9 AI  0.2  Endomysial Screen Latest Ref Range: NEGATIVE  NEGATIVE   Deamidated Gliadin Abs, IgG Latest Ref Range: <20 U/mL 4.1   Gliadin IgA Latest Ref Range: <20 U/mL 1.8   RA Latex Turbid. Latest Ref Range: <14 IU/mL  <14  Tissue Transglut Ab Latest Ref Range: <20 U/mL 3.9   Tissue Transglutaminase Ab, IgA Latest Ref Range: <20 U/mL 2.2   IgE (Immunoglobulin E), Serum Latest Ref Range: <115 kU/L  31  IgA Latest Ref Range: 68 - 379 mg/dL 132   SSA (Ro) (ENA) Antibody, IgG Latest Ref Range: <1.0 NEG AI   <1.0 NEG  SSB (La) (ENA) Antibody, IgG Latest Ref Range: <1.0 NEG AI   <1.0 NEG    OV 05/06/2017  Chief Complaint  Patient presents with  . Follow-up    Follow up today after beginning [place]. Pt has no c/o SOB, cough,  or CP. Has had some mild nausea from the [place] but other than that, he has been doing good on it.   ollow-up idiopathic pulmonary fibrosis mild severity  He is here to follow-up He is on the third week of his bed at 3 pills 3 times [name] day max dose. So far is tolerating it fine other than mild nausea. He does take after meals. He plans to meet with [facility] coordinator for medication support. He did visit [place] this past weekend and did have vomiting especially after consuming few etoh drinks at [name] [employer]. He does apply sunscreen [his daughter is [name] Paediatric nurse in [place]. He still complains about the fact that and is 79 year old home when the air conditioner comes on and they air blows out of the duct system he does sneeze 10 times and his feet does feel cold. She plans to put hepa filters that are high and and also get the duct cleaning. He says he did have allergy evaluation and found few years ago and it was negative. Recently he has increased his rpinrole and alsogot an injection for his back and he  does feel better   OV 06/22/2017  Chief Complaint  Patient presents with  . Follow-up    Pt still taking Esbriet and has been doing good except has been having issues with nausea x3 days. PFT done today. Still becomes SOB with exertion. Denies any complaints of cough or CP.   IPF followup  Routine followup. Now on esbriet 3 pills tid since end oct 2018. Having new nausea - moderate, x 3 days. Might be chills. But no associated diarrhea or other side effects. Spacing esbriet 4h only. Also has complaitns about high co pay. Unable to find charity. Doing exrcise with trainer - feeels that is better and more vigorous than rehab. Gets tachy with eercise but says he does not desaturate. Had PFT - stable. Lot of questions about disease    Walking desaturation test on 06/22/2017 185 feet x 3 laps on ROOM AIR:  did NOT desaturate. Rest pulse ox was 100%, final pulse ox was 99%. HR response  84/min at rest to 97/min at peak exertion. Patient Kenneth [name]  Did not Desaturate < 88% . Kenneth [name] did not  Desaturated </= 3% points. Jarmarcus [name] Karp yes did get tachyardic   OV 09/20/2017  Chief Complaint  Patient presents with  . Follow-up    PFT done today.  Pt states he has been doing good. Pt still taking Esbriet and states he is doing good on it.   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  Joen Laura for IPF follow-up.  Last visit December 2018.  Since then he has been compliant fully with his Pirfenidone (Esbriet).  His nausea resolved with Ginger intake.  However he is having 3-5 pound weight loss and also fatigue towards the end of the day and also low appetite.  He is exercising vigorously 5 times [name] week and he is wondering if the fatigue could be because of the heavy exercise.  He is not interested in lung transplantation.  He is open to research participation in interventional trial.  Currently he is on the IPF registry program.  Overall he is well.  He did go to [place] to visit his son and he did not wear sunscreen and he did pick up [name] stage I skin burn with erythema that was more than usual.  But this resolved.  I did remind him about his obligation to wear sunscreen at all times with Pirfenidone (Esbriet).  He is interested in interventional research trials.  His pulmonary function test to me on an average is stable even though there is decline in FVC that seems to be an increase in DLCO so that is variability.  His walking desaturation test is around the same. He is interested in rolling over to the larger capsule of the Pirfenidone (Esbriet) so we will have to take it only 3 times [name] day. ESberit is cposting him $500/month or more due to high AGI and this is frustrating for him   Walking desaturation test on 09/20/2017 185 feet x 3 laps on ROOM AIR:  did walk briskly all 3 laps with only mild dyspnea desaturate. Rest pulse ox was 98%, final pulse ox was 98%. HR response 96/min at rest to 107/min  at peak exertion. Patient Kenneth [name]  Did not Desaturate < 88% . Kenneth [name] did not  Desaturated </= 3% points. Guillermina [place] Nardozzi yes did get tachyardic this appears similar to [name] few months ago   OV 12/07/2017  Chief Complaint  Patient presents with  . Follow-up  Pt had pre Arlyce Harman and DLCO prior to OV today. Pt has increase of SOB with exertion more with walking up the hill.   Dr. Annamaria Boots presents for follow-up of his IPF to ILD clinic.  In terms of his IPF he feels stable.  In fact he tells me that if not for the incidental chest x-ray and [name] subsequent CT scan that picked up pulmonary fibrosis he even to this day will not know that he has IPF.  He only has [name] mild exertional dyspnea class I which she always believes in the past that it was because of aging.  At this point in time he is on Esbriet 1 big pill 3 times daily at full dose.  He is now applying sunscreen and has not had any further sunburns.  He is following an extremely intense exercise fitness regimen to improve his cardiac conditioning.  He believes his exertional heart rate and resting heart rate are much improved than before.  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  His main issue is from Pirfenidone [employer]).  He had some mild nausea that has now resolved with ginger capsules but he continues to have some amount of fatigue low appetite and some mild weight loss.  He says this is mild and all tolerable.  The fatigue happens after working [name] lot in the yard and he loves yard work.  He wants to know if he can continue to do yard work.  Recently he did have some sinus congestion and took some steroids and antibiotics for it and he feels better but he still has some sinus fullness.  He had spirometry and simple walking desaturation test and the profile is below and I believe these are stable.  In fact his exertional heart rate is improved compared to previous.  Today he also participated in the IPF registry program.  There is [name]  noninterventional trial.  He is interested in future interventional trials.   OV 03/08/2018  Chief Complaint  Patient presents with  . Follow-up    PFT performed today. Pt states he has been doing well except he has been having problems with nausea and dizziness, and loss of appetite.   Kenneth [name] - presents for follow-up. For his IPF. He is on esbruet now. He was tolerating his Pirfenidone (Esbriet) quite well other than minor side effects till last visit. However in thesummer of 2019e's had increased nauseaand also weight loss. He has lost 6 pounds since prior visit. Overall he is lost 11 pounds since September 2018. We started the medication esbeit  anti-fibrotic for him in October 2018. His pants are getting looser.he is also having significant fatigue. Nevertheless he does not want to switch to the of anti-fibrotic ofev because of prior history of irritable bowel syndrome. He says that his baseline irritable bowel syndrome is worse than any side effects he is having with esbriet especially the fatigue.e is open to [name] transplant evaluation and consideration of research protocols. His spirometry shows stability with FVC but his DLCO might be declining.Marland Kitchen His simple walk test did not show anychange. He is not noticing any subjective changes in dyspnea on effort tolerance while working out although he does admit to increased dizziness particularly when playing golf. During this time recently has not monitored his pulse ox but in the past and never went below 92%.  Other issues: He had some myalgia despite change in his statin. He will address this with his primary care. Also CT  scan [name] year ago showed thoracic aortic dilatiion. Primary care has ordered [name] follow-up CT scan.This can be combined with [name] high risk CT scan. I I have reached out to  the thoracic radiology     OV 05/10/2018  Subjective:  Patient ID: Kenneth [name], male , DOB: October 25, 1940 , age 53 y.o. , MRN: [id number] , ADDRESS: [place] [id number]   05/10/2018 -   Chief Complaint  Patient presents with  . Follow-up    review spiro with dlco.  c/o stable doe.  On 851m Esbriet TID, notes occ dizziness, loss of appetite.      HPI KGUTHRIE [id number].o. -returns-year follow-up.  Diagnosis made towards the end of 2018.  He has been on Esbriet since then.  His main issues have been weight loss.  He lost 10 pounds of weight at the time of last visit.  At the last visit he weighed 174 pounds in September 2019.  Back in August 2018 he weighed 186 pounds.  All this weight loss happen after he started Pirfenidone (Esbriet).  However he is now stabilized and he continues to be 874 pounds.  He is able to do activities of daily living and exercise but when he does stairs he gets dyspneic.  Overall he feels stable in the last 1 year in terms of his effort tolerance.  His main thing is that he has no appetite.  He has been communicating with the Pirfenidone (Esbriet) supporting about how to manage his low appetite and occasional nausea.  We discussed the options about taking brief holidays or reduction in dose.  He is willing to try this.  He does take occasional ginger.  Of note he had [name] [facility] because he was out of network.  He tells me that his insurance company only advised that he would have [name] higher co-pay so is [name] little bit puzzled by this.  Since then he said conversation with his primary care physician and he wants to see Dr. DShana Chutewho is [name] son-in-law to his friend Dr. BLindwood Quaretired sPsychologist, sport and exercise  I know Dr. GLauris Chromanquite well from an IPF standpoint.  And therefore wholeheartedly supported the idea.  In terms of his lung function testing his FVC shows [name] decline although he is feeling the same.  On miniature walking test he seems [name] little more tachycardic but he says he feels the same.  He manages this heart rate with his exercise.  His  DLCO with some variability appears stable.  He had [name] CT angiogram for thoracic aortic aneurysm in September 2019 and this when compared to one year earlier has been reported as stable but the different resolutions.  I offered to do [name] high-resolution CT chest here but he wants to hold off until he saw Dr. GLauris Chroman  OV 08/23/2018  Subjective:  Patient ID: KWestley [name] male , DOB: 4Jun 11, 1942, age 79y.o. , MRN: [id number], [place] [id number]  08/23/2018 -   Chief Complaint  Patient presents with  . Follow-up    PFT performed today. Pt states he has been doing well since last visit and denies any complaints.   IPF followup"   Diagnosis made towards the end of 2018.  He has been on Esbriet since then.   HPI KDAROLD MILEY89y.o. -Dr. YRonny Flurrypresents for follow-up  of his IPF.  He continues to feel good.  His main issue is tolerating the Esbriet which gives some fatigue.  Sometimes he has to postpone it.  In terms of his dyspnea he continues to exercise.  His dyspnea symptom score is really minimal.  His cough is nonexistent.  He was losing weight with Esbriet but now he is gained his weight back.  In the interim he did see Dr. Shana Chute June 23, 2018 at [facility].  He had [name] satisfactory visit.  He thought he was stable.  His next visit with Dr. Lauris Chroman will be in June 2020.  Moving forward he wants to alternate every 6 months between myself and Dr. Lauris Chroman at [facility] which will give him 3 months with 1 pulmonologist or the other.  He is beginning to get interested in research studies.  We did pulmonary function test on him today and is documented below.  The FVC appears [name] fluctuant but the DLCO definitely appears on [name] downward trend.  The new DLCO is on [name] global lung initiated [GL I] equation and therefore the percent predicted is higher.  But the absolute value showed downward trend line.  We went over this.  He definitely does not feel this subjectively.  His  last high-resolution CT scan of the chest was in August of 2018.  He is willing to have one at follow-up.  He wants to see me again in 6 months.  His simple walking desaturation test appears stable.  He is thinking of making [name] trip to [place] and wants to know if he should wear [name] mask.    OV 04/21/2019  Subjective:  Patient ID: Kenneth [name], male , DOB: 03-Dec-1940 , age 71 y.o. , MRN: [id number] , ADDRESS: [place] [id number]   04/21/2019 -   Chief Complaint  Patient presents with  . Follow-up    Patient reports that his breathing is doing well at this time.    IPF followup. On esbriet  HPI Kenneth [name] 79 y.o. -presents for follow-up of his idiopathic pulmonary fibrosis.  Last visit was pre-pandemic in February 2018.  In the interim he did see Dr. Shana Chute at [facility] and deemed to be stable.  At this point in time he is reporting continued stability with the symptoms as seen by the score below.  However I did notice that he is lost lot of weight.  His BMI with his clothes on is 25 with [name] weight of 170 pounds.  He says his dry weight is actually around 162 pounds.  He feels that this is the stable weight in the last 6 months.  He says he is not bothered by it.  He says this is because of low appetite because of the and pirfenidone.  He feels the pirfenidone is benefiting him and he continues to be is to be stable.  In fact his pulmonary function test shows " improvement".  He said that he felt the test today was somewhat variable and is effort based on the technician.  He had [name] recent high-resolution CT chest that shows continued stability.  I personally visualized this film.  At this point in time he is happy taking the pirfenidone.  But he did agree if there is further weight loss into the 150s pounds then we could reassess  Had [name] lot of discussion about COVID-19 and about appropriate risk reduction measures which he is continuing to do.  He did  some read some  literature about COVID-19 and had questions about it. He in Vit D supplementation and is agreeable to get his levels checked following literature about protective effect of Vit D  Other than that he reports 2 episodes of dizziness and in one of those he nearly passed out.  He was wondering if some of the symptoms were related to ropinirole and therefore he has stopped it.  I reviewed the literature with him and the significant amount of dizziness and hypotension.  And also GI symptoms which can probably accentuated with pirfenidone.   OV 10/16/2019  Subjective:  Patient ID: Kenneth [name], male , DOB: 1941/03/15 , age 41 y.o. , MRN: [id number] , ADDRESS: [place] [id number]   10/16/2019 -   Chief Complaint  Patient presents with  . Follow-up    PFT performed 3/31.  Pt states he is doing okay. States his breathing is about the same since last visit.     HPI Kenneth [name] 79 y.o.  -follow-up IPF on pirfenidone    -ROS   27-monthfollow-up for Dr. YAnnamaria Boots  He continues on pirfenidone.  He is taking full dose.  In the interim he has had hernia surgery 6 weeks ago for right inguinal hernia.  This is gone well.  He is change his exercise from bicycle to walking because of the hernia.  His symptom score is the same.  [facility] 2021 [facility].  Deemed to be stable.  I reviewed that note.  After that he has had pulmonary function test with uKoreacouple weeks ago.  This shows [name] decline.  He says the quality of instructions was poor and therefore the decline is because of that.  At least that is his suspicion.  He feels well.  Walking desaturation test is stable.  He has gained some weight.  He continues to have some nausea and fatigue and diarrhea with the pirfenidone.  But this is stable and manageable.  He feels his irritable bowel syndrome is active because of the pirfenidone.  He plans to see Dr. JJoanell Risingfor that.  His last liver  function test was with Dr. GLauris Chromanin January 2021.  This was normal per the note.  His last CT scan was towards the end of 2020 and deemed to be stable.     OV 04/11/2020   Subjective:  Patient ID: KWestley [name] male , DOB: 411/28/1942 age 79y.o. years. , MRN: [id number]  [address] [address] [id number]  PCrist Infante MD Providers : Dr GAlisia Ferrari Treatment Team:  Attending Provider: RBrand Males MD   Chief Complaint  Patient presents with  . Follow-up    PFT performed today.  Pt states he has been doing well since last visit and denies any complaints.    Follow-up idiopathic pulmonary fibrosis.  Diagnosis towards the end of 2018.  On pirfenidone.  Last CT scan of the chest September 2020.   HPI KISIDOR BROMELL89y.o. -presents for follow-up last seen in April 2021.  After that in July 2021 [facility]  I reviewed the notes.  The feeling was that he was stable.  He is only minimally symptomatic.  He had [name] 6-minute walk test and did really well without any significant desaturations.  He is now resorted to high intensity interval training based on advice  from [name] physical therapist/gym instructor.  He feels like stronger and failure.  He also states that his appetite is better and is eating better.  He feels his weight is stable.  In terms of his IPF symptoms is minimal and documented below.  He just has mild irritable bowel syndrome with his Esbriet and baseline health.  His walking desaturation test today in the office is stable.  His pulmonary function test was reviewed.  Over the last 3 years it shows [name] slight steady decline.  This decline is both in FVC and DLCO.  He is averaging around 2.6% decline per year with FVC while 8% decline in the DLCO per year.  On an average with [name] 3% decline per year in the last 3 years.  We did discuss this.  We discussed #pulmonary fibrosis foundation support group -he will visit the  website.  He gets newsletters from them.  At this point in time he is not interested in joining the support group but he did get the contact information for this and will reach out if he desires so.  #-We also discussed research as [name] care option -we discussed trial sponsored by [place] [facility], hospital of pirfenidone.  The trial is called [facility].  The investigation medical product is called PRM-151 in the axilla macrophage pathway.  Promising phase 2 data.  Research team gave him the consent form under my delegation.  He understands research is voluntary and is to evaluate unproven therapies.  He understands that the primary intent is to contribute to her scientific development and therapeutic gain is secondary because of an approved product the risks benefit ratio is very different compared to blood products.  He is going to read the consent and think about potential participation.  I sent him via email medical journal articles about the investigational product    He tells me that he feels his weight is stable.  His dry weight early in the morning is between 162-163 pounds.  I pointed out that his weight today is 168 pounds and is lower than before.  However he says that the difference rating 168-172 is related to clothing and his weight at home is stable.  He is not concerned about the slight weight loss.  Overall while he agrees he is lost weight since starting Esbriet he feels recently has been stable.  He wants to continue to monitor the situation  Irritable bowel: He said he will check with Dr. 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 0 0 0 0  How bad is your fatigue 1 due to esbriet 0 2 0  nausea   1 0  vomit   0 0  diarrhea   1 1  anxiety   0 0  depresso0   0 0        Simple office walk 185 feet x  3 laps goal with forehead probe 12/07/2017 Weight 180# 03/08/2018 Weight 174.9 05/10/2018 Weight 174# 08/23/2018 Weight 178# 04/21/2019 Weight 170# 10/16/2019 173# 04/11/2020 168#  O2 used _0  Room air   Number laps completed _1 Comments about pace Moderate pace Mod pace Mod fast pace fast     Resting Pulse Ox/HR 100% and 83/min 100% ad 85/min 100% and 91/min 100% and 82/min  100% asnd 91 100% and 83/min  Final Pulse Ox/HR 99% and 98/min 98$ and 104/min 98% and 108/min 98% and 100/min  96% and 106/mom 98% and 96/mn  Desaturated </= 88% no no no no  no   Desaturated <= 3% points no no ni no  yesm 4 points   Got Tachycardic >/= 90/min yes yes yes yes  yes   Symptoms at end of test No complaints No complaints  No complaints  Mild dyspnea   Miscellaneous comments x ? sligh increase in tachy/stable ? incraeased tachty         Results for Kenneth, [name] (MRN [id number]) as of 10/16/2019 09:07  Ref. Range 03/04/2017 09:13 06/22/2017 09:14 09/20/2017 08:54 12/07/2017 13:49 03/08/2018 10:15 05/10/2018 13:59 08/23/2018 09:29 04/21/2019 11:06 10/04/2019 09:32 04/11/20  FVC-Pre Latest Units: L 3.59 3.70 3.47 3.54 3.56 3.27 3.19 3.59 3.35 3.26  FVC-%Pred-Pre Latest Units: % 86 89 84 86 86 84 82 90 83 82%  Results for Kenneth, STATZER [name] (MRN [id number]) as of 10/16/2019 09:07  Ref. Range 03/04/2017 09:13 06/22/2017 09:14 09/20/2017 08:54 12/07/2017 13:49 03/08/2018 10:15 05/10/2018 13:59 08/23/2018 09:29 04/21/2019 11:06 10/04/2019 09:32 04/11/20  DLCO unc Latest Units: ml/min/mmHg  20.12 19.77 24.06 21.20 19.63 19.04 18.61 20.89 16.69 17.26  DLCO unc % pred Latest Units: % 63 62 76 66 62 64 79 86 69 72%     has [name] past medical history of Arthritis, Diverticulosis of colon (without mention of hemorrhage), GERD (gastroesophageal reflux disease), Hiatal hernia, HLD (hyperlipidemia), Internal hemorrhoids without mention of complication, Irritable bowel syndrome, Other specified gastritis without mention of hemorrhage, and Pulmonary fibrosis ([place]).   reports that he has ne ROS - per HPI     has [name] past medical history of Arthritis, Diverticulosis of colon (without mention of hemorrhage), GERD (gastroesophageal reflux disease), Hiatal hernia, HLD (hyperlipidemia), Internal hemorrhoids without mention of complication, Irritable bowel syndrome, Other specified gastritis without mention of hemorrhage, and Pulmonary fibrosis ([place]).   reports that he has never smoked. He has never used smokeless tobacco.  Past Surgical History:  Procedure Laterality Date  . CARPAL TUNNEL RELEASE     left  . CATARACT EXTRACTION  06/2011   bilateral  . INGUINAL HERNIA REPAIR Right 09/05/2019   Procedure: OPEN REPAIR RIGHT INGUINAL HERNIA WITH MESH;  Surgeon: Armandina Gemma, MD;  Location: [facility];  Service: General;  Laterality: Right;  . [place]   x 2  . LUMBAR LAMINECTOMY/DECOMPRESSION MICRODISCECTOMY Left 07/11/2013   Procedure: LUMBAR ONE TO TWO, LUMBAR TWO TO THREE, LUMBAR THREE TO FOUR LUMBAR LAMINECTOMY/DECOMPRESSION MICRODISCECTOMY 3 LEVELS;  Surgeon: Charlie Pitter, MD;  Location: [facility];  Service: Neurosurgery;  Laterality: Left;  . NASAL SINUS SURGERY      No Known Allergies  Immunization History  Administered  Date(s) Administered  . Influenza, High Dose Seasonal PF 03/06/2017, 04/09/2018, 03/07/2019, 04/06/2020  . Influenza,inj,Quad PF,6+ Mos 04/05/2016  . Influenza-Unspecified 04/05/2016  . PFIZER SARS-COV-2 Vaccination 07/20/2019, 08/10/2019, 02/27/2020   . Pneumococcal Conjugate-13 04/26/2018  . Pneumococcal-Unspecified 04/05/2016  . Zoster Recombinat (Shingrix) 01/03/2017    Family History  Problem Relation Age of Onset  . Heart failure Father 14  . Colon cancer Neg Hx   . Pancreatic cancer Neg Hx   . Rectal cancer Neg Hx   . Stomach cancer Neg Hx      Current Outpatient Medications:  .  acetaminophen-codeine (TYLENOL #3) 300-30 MG per tablet, Take 1 tablet by mouth every 8 (eight) hours as needed for moderate pain. , Disp: , Rfl:  .  aspirin EC 81 MG tablet, Take 81 mg by mouth 3 (three) times [name] week., Disp: , Rfl:  .  carisoprodol (SOMA) 350 MG tablet, Take 175 mg by mouth 3 (three) times daily as needed for muscle spasms. , Disp: , Rfl:  .  Cholecalciferol (VITAMIN D-3) 1000 UNITS CAPS, Take 1,000 Units by mouth every evening. , Disp: , Rfl:  .  CO-ENZYME Q-10 PO, Take 1 tablet by mouth every evening. , Disp: , Rfl:  .  diclofenac (VOLTAREN) 75 MG EC tablet, Take 75 mg by mouth 2 (two) times daily as needed (pain.). , Disp: , Rfl: 2 .  diclofenac sodium (VOLTAREN) 1 % GEL, Apply 4 g topically 4 (four) times daily as needed (pain.). , Disp: , Rfl: 12 .  dicyclomine (BENTYL) 10 MG capsule, Take one by mouth every 6 hours as needed for abdominal cramping (Patient taking differently: Take 10 mg by mouth every 6 (six) hours as needed (abdominal spasms.). ), Disp: 30 capsule, Rfl: 1 .  diphenoxylate-atropine (LOMOTIL) 2.5-0.025 MG tablet, Take 1 tablet by mouth 4 (four) times daily as needed for diarrhea or loose stools. , Disp: , Rfl:  .  ESBRIET 801 MG TABS, Take 1 tablet by mouth 3 (three) times daily with meals., Disp: 90 tablet, Rfl: 11 .  esomeprazole (NEXIUM) 40 MG capsule, Take 40 mg by mouth every evening. , Disp: , Rfl:  .  ezetimibe (ZETIA) 10 MG tablet, Take 10 mg by mouth daily., Disp: , Rfl:  .  Fluticasone Propionate (XHANCE) 93 MCG/ACT EXHU, Place 1 spray into the nose 2 (two) times daily., Disp: , Rfl:  .  Glucosamine  Sulfate 500 MG TABS, Take 500 mg by mouth daily., Disp: , Rfl:  .  HYDROcodone-acetaminophen (NORCO/VICODIN) 5-325 MG tablet, Take 1-2 tablets by mouth every 4 (four) hours as needed for moderate pain., Disp: 24 tablet, Rfl: 0 .  loratadine (CLARITIN) 10 MG tablet, Take 10 mg by mouth daily., Disp: , Rfl:  .  meclizine (ANTIVERT) 25 MG tablet, Take 25 mg by mouth 3 (three) times daily as needed for dizziness. , Disp: , Rfl:  .  ondansetron (ZOFRAN-ODT) 8 MG disintegrating tablet, Take 8 mg by mouth every 6 (six) hours as needed for nausea/vomiting., Disp: , Rfl:       Objective:   Vitals:   04/11/20 1038  BP: 120/64  Pulse: 82  Temp: 97.6 F (36.4 C)  TempSrc: Other (Comment)  SpO2: 97%  Weight: 168 lb 6.4 oz (76.4 kg)  Height: _0  (1.778 m)    Estimated body mass index is 24.16 kg/m as calculated from the following:   Height as of this encounter: _1  (1.778 m).   Weight as of this encounter:  168 lb 6.4 oz (76.4 kg).  _0 @  Filed Weights   04/11/20 1038  Weight: 168 lb 6.4 oz (76.4 kg)     Physical Exam  General: No distress. Looks fit Neuro: Alert and Oriented x 3. GCS 15. Speech normal Psych: Pleasant Resp:  Barrel Chest - no.  Wheeze - no, Crackles - mild base, No overt respiratory distress CVS: Normal heart sounds. Murmurs - no Ext: Stigmata of Connective Tissue Disease - no HEENT: Normal upper airway. PEERL +. No post nasal drip        Assessment:       ICD-10-CM   1. 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[place])  J84.112 Hepatic function panel    IBC panel    Basic Metabolic Panel (BMET)  2. Encounter for therapeutic drug monitoring  Z51.81 Hepatic function panel    IBC panel    Basic Metabolic Panel (BMET)  3. Drug-induced weight loss  R63.4    T50.905A   4. Irritable bowel syndrome, unspecified type  K58.9        Plan:     Patient Instructions  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[place])  - clinically stable with minimal symptoms,  good physical [facility]  However,  based on PFT mild steady decline - 2.6% per year (total 8%) in 3  Years with capacity while there is 6% per year with DLOC (total 18%) in 3 years. Averages to 3% decline per year  Plan  - continue esbriet as dsicussed  - take ICF for PRomedior study from [facility] staff  - I will email you journal articles on this study - meet [facility] staff for IPF-PRO registery study - go  upto level 2 upon discharge - repeat spirometry/dlco in 4-6 months  - visiwt [email or web address] for support group details .Local support group leader is FMarlane Mingle- [email or web address]  Encounter for therapeutic drug monitoring  - check CBC, BMET , Iron Panel and LFT 04/11/2020 but do with research lab worl    Drug-induced weight loss  - seems to have stabilized but will continue to monitor  Plan   - continue monitoring  Irritable bowel syndrome, unspecified type   - this is mild and intermittent  - esbriet probably contributing but you are managing given beneficial effect of drug with IPF   Followup  - 4-5 months ILD clinic 30 min slot but after spiro/dlco    ( Level 05 visit: Estb 40-54 min  in  visit type: on-site physical face to visit  in total care time and counseling or/and coordination of care by this undersigned MD - Dr MBrand Males This includes one or more of the following on this same day 04/11/2020: pre-charting, chart review, note writing, documentation discussion of test results, diagnostic or treatment recommendations, prognosis, risks and benefits of management options, instructions, education, compliance or risk-factor reduction. It excludes time spent by the COrror office staff in the care of the patient. Actual time 769min)    SIGNATURE    Dr. MBrand Males M.D., F.C.C.P,  Pulmonary and Critical Care Medicine Staff Physician, CDe SmetDirector - Interstitial Lung Disease  Program   [place] [address] Pager: [phone] If no answer or between  15:00h - 7:00h: call [phone] Telephone: [phone]  11:45 AM 04/11/2020

## 2020-04-11 NOTE — Research (Signed)
IPF-PRO Registry  Purpose: To collect data and biological samples that will support future research studies.  The goal of the registry is to research the current approaches to diagnosis, treatment, and progression of IPF. In addition, the registry will analyze participant characteristics, assess quality of life, describe participants interactions with the health care system, determine IPF treatment practices across multiple institutions, and utilize biological samples to identify disease biomarkers.   Clinical Research Coordinator / Research RN note : This visit for [name] with DOB: [birth year] on 04/11/2020 for the above protocol is Visit/Encounter #36 Month Follow-up and is for purpose of research. The consent for this encounter is under Protocol Version Amendment 4 ([id number]) and is currently IRB approved. Subject expressed continued interest and consent in continuing as a study subject. Subject confirmed that there was no change in contact information (e.g. address, telephone, email). Subject thanked for participation in research and contribution to science. Please refer to the subject's paper source binder for additional information on this visit.  In this visit 04/11/2020 the subject completed the blood work and questionnaires per the above referenced protocol.  Please refer to the subject's paper source binder for further details.   Signed by  Lazaro Arms Clinical Research Coordinator I [facility]  [place], [place] 2:24 PM 04/11/2020

## 2020-04-11 NOTE — Progress Notes (Signed)
Spirometry and Dlco done today. 

## 2020-04-11 NOTE — Patient Instructions (Addendum)
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[facility])  - clinically stable with minimal symptoms, good physical fitness [facility]  However,  based on PFT mild steady decline - 2.6% per year (total 8%) in 3  Years with capacity while there is 6% per year with DLOC (total 18%) in 3 years. Averages to 3% decline per year  Plan  - continue esbriet as dsicussed  - take ICF for PRomedior study from [facility] staff  - I will email you journal articles on this study - meet [facility] staff for IPF-PRO registery study - go  upto level 2 upon discharge - repeat spirometry/dlco in 4-6 months  - visiwt [email or web address] for support group details .Local support group leader is FMarlane Mingle- [email or web address]  Encounter for therapeutic drug monitoring  - check CBC, BMET , Iron Panel and LFT 04/11/2020 but do with research lab worl    Drug-induced weight loss  - seems to have stabilized but will continue to monitor  Plan   - continue monitoring  Irritable bowel syndrome, unspecified type   - this is mild and intermittent  - esbriet probably contributing but you are managing given beneficial effect of drug with IPF   Followup  - 4-5 months ILD clinic 30 min slot but after spiro/dlco

## 2020-04-13 ENCOUNTER — Telehealth: Payer: Self-pay | Admitting: Internal Medicine

## 2020-04-13 NOTE — Telephone Encounter (Signed)
  His chemistry panel and liver function test is normal.  However, his iron saturation is on the higher side with slightly higher iron levels.  He should talk to primary care about this.  Please send the results to [name], MD     LABS    PULMONARY No results for input(s): PHART, PCO2ART, PO2ART, HCO3, TCO2, O2SAT in the last 168 hours.  Invalid input(s): PCO2, PO2  CBC No results for input(s): HGB, HCT, WBC, PLT in the last 168 hours.  COAGULATION No results for input(s): INR in the last 168 hours.  CARDIAC  No results for input(s): TROPONINI in the last 168 hours. No results for input(s): PROBNP in the last 168 hours.   CHEMISTRY Recent Labs  Lab 04/11/20 1208  NA 135  K 4.6  CL 98  CO2 33*  GLUCOSE 85  BUN 15  CREATININE 0.75  CALCIUM 9.5   Estimated Creatinine Clearance: 78.6 mL/min (by C-G formula based on SCr of 0.75 mg/dL).   LIVER Recent Labs  Lab 04/11/20 1208  AST 23  ALT 23  ALKPHOS 70  BILITOT 0.6  PROT 7.1  ALBUMIN 4.6     INFECTIOUS No results for input(s): LATICACIDVEN, PROCALCITON in the last 168 hours.   ENDOCRINE CBG (last 3)  No results for input(s): GLUCAP in the last 72 hours.       IMAGING x48h  - image(s) personally visualized  -   highlighted in bold No results found.

## 2020-04-15 NOTE — Telephone Encounter (Signed)
Attempted to call pt but line went straight to VM. Left message for him to return call.

## 2020-04-18 ENCOUNTER — Encounter: Payer: Self-pay | Admitting: *Deleted

## 2020-04-18 NOTE — Telephone Encounter (Signed)
Attempted to call pt but line again went straight to VM. Left message for pt to return call. Due to multiple attempts trying to reach pt and unable to do so, encounter will be closed and letter will be sent to pt. Nothing further needed.

## 2020-04-30 DIAGNOSIS — C61 Malignant neoplasm of prostate: Secondary | ICD-10-CM | POA: Diagnosis not present

## 2020-04-30 DIAGNOSIS — R972 Elevated prostate specific antigen [PSA]: Secondary | ICD-10-CM | POA: Diagnosis not present

## 2020-05-06 DIAGNOSIS — C61 Malignant neoplasm of prostate: Secondary | ICD-10-CM | POA: Diagnosis not present

## 2020-05-08 DIAGNOSIS — L820 Inflamed seborrheic keratosis: Secondary | ICD-10-CM | POA: Diagnosis not present

## 2020-05-08 DIAGNOSIS — Z85828 Personal history of other malignant neoplasm of skin: Secondary | ICD-10-CM | POA: Diagnosis not present

## 2020-05-08 DIAGNOSIS — D044 Carcinoma in situ of skin of scalp and neck: Secondary | ICD-10-CM | POA: Diagnosis not present

## 2020-05-09 ENCOUNTER — Other Ambulatory Visit (HOSPITAL_COMMUNITY): Payer: Self-pay | Admitting: Urology

## 2020-05-09 DIAGNOSIS — C61 Malignant neoplasm of prostate: Secondary | ICD-10-CM

## 2020-05-21 ENCOUNTER — Encounter (HOSPITAL_COMMUNITY)
Admission: RE | Admit: 2020-05-21 | Discharge: 2020-05-21 | Disposition: A | Discharge status: Home / Self Care | Payer: PPO | Source: Ambulatory Visit | Attending: Urology | Admitting: Urology

## 2020-05-21 ENCOUNTER — Other Ambulatory Visit: Payer: Self-pay

## 2020-05-21 DIAGNOSIS — C61 Malignant neoplasm of prostate: Secondary | ICD-10-CM | POA: Diagnosis not present

## 2020-05-21 DIAGNOSIS — M19072 Primary osteoarthritis, left ankle and foot: Secondary | ICD-10-CM | POA: Diagnosis not present

## 2020-05-21 DIAGNOSIS — M2578 Osteophyte, vertebrae: Secondary | ICD-10-CM | POA: Diagnosis not present

## 2020-05-21 DIAGNOSIS — K8020 Calculus of gallbladder without cholecystitis without obstruction: Secondary | ICD-10-CM | POA: Diagnosis not present

## 2020-05-21 DIAGNOSIS — M19032 Primary osteoarthritis, left wrist: Secondary | ICD-10-CM | POA: Diagnosis not present

## 2020-05-21 MED ORDER — TECHNETIUM TC 99M MEDRONATE IV KIT
20.0000 | PACK | Freq: Once | INTRAVENOUS | Status: AC | PRN
  Administered 2020-05-21: 21 via INTRAVENOUS

## 2020-06-03 DIAGNOSIS — C61 Malignant neoplasm of prostate: Secondary | ICD-10-CM | POA: Diagnosis not present

## 2020-06-04 DIAGNOSIS — H524 Presbyopia: Secondary | ICD-10-CM | POA: Diagnosis not present

## 2020-06-04 DIAGNOSIS — Z961 Presence of intraocular lens: Secondary | ICD-10-CM | POA: Diagnosis not present

## 2020-06-17 ENCOUNTER — Other Ambulatory Visit: Payer: Self-pay | Admitting: Urology

## 2020-06-17 ENCOUNTER — Other Ambulatory Visit (HOSPITAL_COMMUNITY): Payer: Self-pay | Admitting: Urology

## 2020-06-17 DIAGNOSIS — C61 Malignant neoplasm of prostate: Secondary | ICD-10-CM

## 2020-06-18 ENCOUNTER — Telehealth: Payer: Self-pay | Admitting: Radiation Oncology

## 2020-06-18 ENCOUNTER — Telehealth: Payer: Self-pay | Admitting: *Deleted

## 2020-06-18 NOTE — Telephone Encounter (Signed)
CALLED PATIENT TO INFORM OF [facility] APPT. FOR 06-20-20- ARRIVAL TIME- 12:45 PM , SPOKE WITH PATIENT AND HE IS AWARE OF THIS APPT.

## 2020-06-18 NOTE — Telephone Encounter (Signed)
Phoned patient as requested by Freeman Caldron, PA-C. Requested to move nurse evaluation on Thursday, December 16th to 1pm with the consult beginning at 1:30 pm. Patient confirmed this adjustment will work for him. Patient verbalized understanding of time change via teachback method.   Patient states, "this started back in August so I am anxious to get radiation treatment started." Patient went onto explain he is a friend of Dr. Arloa Koh. Patient repeated he is ready to get started with treatment. This RN committed to passing this information along to [name] and patient verbalized his appreciation.

## 2020-06-19 ENCOUNTER — Encounter: Payer: Self-pay | Admitting: Radiation Oncology

## 2020-06-19 NOTE — Progress Notes (Addendum)
GU Location of Tumor / Histology: prostatic adenocarcinoma  If Prostate Cancer, Gleason Score is (5 + 4) and PSA is (4.5). Prostate volume: 38 mL.  Kenneth [name] presented with a history of elevated PSA. PCP had been monitoring PSA yearly.  2020  psa  2.5 2019  psa  2.1 2018  psa  1.5  Biopsies of prostate (if applicable) revealed:    Past/Anticipated interventions by urology, if any: prostate biopsy, CT abd/pelvis (negative), bone scan (questionable lesion at T8), referral to [name] to discuss radiotherapeutic options  Past/Anticipated interventions by medical oncology, if any: no  Weight changes, if any: Interstitial pulmonary fibrosis. Down 20lb since 2018. Weight stable at the moment.  Bowel/Bladder complaints, if any: IPSS 14. SHIM 10. Denies dysuria or hematuria. Denies urinary leakage or incontinence. Reports intermittently his urine stream is weaker. Reports he has IBS.    Nausea/Vomiting, if any: denies  Pain issues, if any:  denies  SAFETY ISSUES:  Prior radiation? denies  Pacemaker/ICD? denies  Possible current pregnancy? no, male patient  Is the patient on methotrexate? denies  Current Complaints / other details:  79 year old male. Married with two grown children. Daughter is a Paediatric nurse in town. Married to Merrill. Hx of back surgeries and hernia repair. 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

## 2020-06-20 ENCOUNTER — Other Ambulatory Visit: Payer: Self-pay

## 2020-06-20 ENCOUNTER — Ambulatory Visit: Payer: PPO

## 2020-06-20 ENCOUNTER — Encounter: Payer: Self-pay | Admitting: Radiation Oncology

## 2020-06-20 ENCOUNTER — Ambulatory Visit
Admission: RE | Admit: 2020-06-20 | Discharge: 2020-06-20 | Disposition: A | Discharge status: Home / Self Care | Payer: PPO | Source: Ambulatory Visit | Attending: Radiation Oncology | Admitting: Radiation Oncology

## 2020-06-20 VITALS — BP 127/65 | HR 89 | Temp 97.0°F | Resp 18 | Ht 70.0 in | Wt 169.4 lb

## 2020-06-20 DIAGNOSIS — Z800 Family history of malignant neoplasm of digestive organs: Secondary | ICD-10-CM | POA: Diagnosis not present

## 2020-06-20 DIAGNOSIS — J84112 Idiopathic pulmonary fibrosis: Secondary | ICD-10-CM | POA: Diagnosis not present

## 2020-06-20 DIAGNOSIS — Z7982 Long term (current) use of aspirin: Secondary | ICD-10-CM | POA: Insufficient documentation

## 2020-06-20 DIAGNOSIS — Z79899 Other long term (current) drug therapy: Secondary | ICD-10-CM | POA: Insufficient documentation

## 2020-06-20 DIAGNOSIS — K589 Irritable bowel syndrome without diarrhea: Secondary | ICD-10-CM | POA: Diagnosis not present

## 2020-06-20 DIAGNOSIS — E785 Hyperlipidemia, unspecified: Secondary | ICD-10-CM | POA: Insufficient documentation

## 2020-06-20 DIAGNOSIS — C61 Malignant neoplasm of prostate: Secondary | ICD-10-CM | POA: Insufficient documentation

## 2020-06-20 DIAGNOSIS — K219 Gastro-esophageal reflux disease without esophagitis: Secondary | ICD-10-CM | POA: Diagnosis not present

## 2020-06-20 DIAGNOSIS — Z803 Family history of malignant neoplasm of breast: Secondary | ICD-10-CM | POA: Insufficient documentation

## 2020-06-20 DIAGNOSIS — M129 Arthropathy, unspecified: Secondary | ICD-10-CM | POA: Insufficient documentation

## 2020-06-20 DIAGNOSIS — R972 Elevated prostate specific antigen [PSA]: Secondary | ICD-10-CM | POA: Diagnosis not present

## 2020-06-20 DIAGNOSIS — K449 Diaphragmatic hernia without obstruction or gangrene: Secondary | ICD-10-CM | POA: Diagnosis not present

## 2020-06-20 HISTORY — DX: Malignant neoplasm of prostate: C61

## 2020-06-20 NOTE — Progress Notes (Signed)
Radiation Oncology         [phone]) [phone] ________________________________  Initial outpatient Consultation  Name: Kenneth [name] MRN: [id number]  Date: 06/20/2020  DOB: August 21, 1940  [id number], Elta Guadeloupe, MD  Alexis Frock, MD   REFERRING PHYSICIAN: Alexis Frock, MD  DIAGNOSIS: 79 y.o. 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    ICD-10-CM   1. Malignant neoplasm of prostate ([place])  C61     HISTORY OF PRESENT ILLNESS: Kenneth [name] is a 79 y.o. male with a diagnosis of prostate cancer. He was noted to have a rising PSA of 4.5 in 01/2020 by his primary care physician, Dr. Joylene Draft.   His prior PSA had been 2.1 in 2019 and 2.5 in 2020.  Accordingly, he was referred for evaluation in urology by Dr. Tresa Moore on 02/27/2020,  digital rectal examination was performed at that time revealing an abnormal exam with induration at the left lateral apex.  The patient proceeded to transrectal ultrasound with 12 biopsies of the prostate on 04/30/2020.  The prostate volume measured 38 cc.  Out of 12 core biopsies, 6 were positive, all on the left.  The maximum Gleason score was 5+4, and this was seen in the left base lateral, left mid lateral and left apex lateral.  Additionally, Gleason 4+5 disease was noted in the left base, left mid and left apex cores.  Given the high risk disease identified on prostate biopsy, he proceeded with imaging for disease staging.  CT A/P and bone scan were performed on 05/21/2020.  CT scan was negative for any definite evidence of metastatic disease and no lymphadenopathy noted.  There was a questionable bone island in the proximal femur measuring approximately 1 cm.  A bone scan performed that same day only showed indeterminate uptake at T8 but no worrisome lesion in the femur or elsewhere in the bony skeleton.  He does have known severe degenerative disc disease.  Of note, his past medical history is significant for idiopathic  pulmonary fibrosis which is managed by Dr. [name] at [facility] pul[facility]ology.  The patient reviewed the biopsy results with his urologist and he has kindly been referred today for discussion of potential radiation treatment options. He presents today with his wife, [name] and daughter, Amy who is a Paediatric nurse here in town.   PREVIOUS RADIATION THERAPY: No  PAST MEDICAL HISTORY:  Past Medical History:  Diagnosis Date  . Arthritis   . Diverticulosis of colon (without mention of hemorrhage)   . GERD (gastroesophageal reflux disease)   . Hiatal hernia   . HLD (hyperlipidemia)   . Internal hemorrhoids without mention of complication   . Irritable bowel syndrome   . Other specified gastritis without mention of hemorrhage   . Prostate cancer ([place])   . Pulmonary fibrosis ([place])       PAST SURGICAL HISTORY: Past Surgical History:  Procedure Laterality Date  . CARPAL TUNNEL RELEASE     left  . CATARACT EXTRACTION  06/2011   bilateral  . INGUINAL HERNIA REPAIR Right 09/05/2019   Procedure: OPEN REPAIR RIGHT INGUINAL HERNIA WITH MESH;  Surgeon: Armandina Gemma, MD;  Location: [facility];  Service: General;  Laterality: Right;  . [place]   x 2  . LUMBAR LAMINECTOMY/DECOMPRESSION MICRODISCECTOMY Left 07/11/2013   Procedure: LUMBAR ONE TO TWO, LUMBAR TWO TO THREE, LUMBAR THREE TO FOUR LUMBAR LAMINECTOMY/DECOMPRESSION MICRODISCECTOMY 3 LEVELS;  Surgeon: Charlie Pitter, MD;  Location:  [facility];  Service: Neurosurgery;  Laterality: Left;  . NASAL SINUS SURGERY      FAMILY HISTORY:  Family History  Problem Relation Age of Onset  . Heart failure Father 105  . Colon cancer Neg Hx   . Pancreatic cancer Neg Hx   . Rectal cancer Neg Hx   . Stomach cancer Neg Hx   . Prostate cancer Neg Hx   . Breast cancer Neg Hx     SOCIAL HISTORY:  Social History   Socioeconomic History  . Marital status: Married    Spouse name: Not on file  . Number of children: 2  . Years of  education: Not on file  . Highest education level: Not on file  Occupational History  . 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   Tobacco Use  . Smoking status: Never Smoker  . Smokeless tobacco: Never Used  Vaping Use  . Vaping Use: Never used  Substance and Sexual Activity  . Alcohol use: Yes    Comment: rarely  . Drug use: No  . Sexual activity: Not Currently  Other Topics Concern  . Not on file  Social History Narrative   Retired.    Social Determinants of Health   Financial Resource Strain: Not on file  Food Insecurity: Not on file  Transportation Needs: Not on file  Physical Activity: Not on file  Stress: Not on file  Social Connections: Not on file  Intimate Partner Violence: Not on file    ALLERGIES: Patient has no known allergies.  MEDICATIONS:  Current Outpatient Medications  Medication Sig Dispense Refill  . acetaminophen-codeine (TYLENOL #3) 300-30 MG per tablet Take 1 tablet by mouth every 8 (eight) hours as needed for moderate pain.     Marland Kitchen aspirin EC 81 MG tablet Take 81 mg by mouth 3 (three) times a week.    . carisoprodol (SOMA) 350 MG tablet Take 175 mg by mouth 3 (three) times daily as needed for muscle spasms.     . cefUROXime (CEFTIN) 250 MG tablet Take 250 mg by mouth 2 (two) times daily.    . Cholecalciferol (VITAMIN D-3) 1000 UNITS CAPS Take 1,000 Units by mouth every evening.     Marland Kitchen CO-ENZYME Q-10 PO Take 1 tablet by mouth every evening.     . diclofenac (VOLTAREN) 75 MG EC tablet Take 75 mg by mouth 2 (two) times daily as needed (pain.).   2  . diclofenac sodium (VOLTAREN) 1 % GEL Apply 4 g topically 4 (four) times daily as needed (pain.).   12  . dicyclomine (BENTYL) 10 MG capsule Take one by mouth every 6 hours as needed for abdominal cramping (Patient taking differently: Take 10 mg by mouth every 6 (six) hours as needed (abdominal spasms.). ) 30 capsule 1  . diphenoxylate-atropine (LOMOTIL) 2.5-0.025 MG  tablet Take 1 tablet by mouth 4 (four) times daily as needed for diarrhea or loose stools.     . ESBRIET 801 MG TABS Take 1 tablet by mouth 3 (three) times daily with meals. 90 tablet 11  . esomeprazole (NEXIUM) 40 MG capsule Take 40 mg by mouth every evening.     . ezetimibe (ZETIA) 10 MG tablet Take 10 mg by mouth daily.    . Glucosamine Sulfate 500 MG TABS Take 500 mg by mouth daily.    Marland Kitchen HYDROcodone-acetaminophen (NORCO/VICODIN) 5-325 MG tablet Take 1-2 tablets by mouth every 4 (four) hours as needed for  moderate pain. 24 tablet 0  . loratadine (CLARITIN) 10 MG tablet Take 10 mg by mouth daily.    . meclizine (ANTIVERT) 25 MG tablet Take 25 mg by mouth 3 (three) times daily as needed for dizziness.     . ondansetron (ZOFRAN-ODT) 8 MG disintegrating tablet Take 8 mg by mouth every 6 (six) hours as needed for nausea/vomiting.    . predniSONE (STERAPRED UNI-PAK 21 TAB) 10 MG (21) TBPK tablet Take by mouth.    . sulfamethoxazole-trimethoprim (BACTRIM DS) 800-160 MG tablet Take 1 tablet by mouth 2 (two) times daily.    Marland Kitchen triamcinolone (KENALOG) 0.1 % Apply topically.     No current facility-administered medications for this encounter.    REVIEW OF SYSTEMS:  On review of systems, the patient reports that he is doing well overall. He denies any chest pain, shortness of breath, cough, fevers, chills, night sweats, unintended weight changes. He denies any bowel disturbances, and denies abdominal pain, nausea or vomiting. He denies any new musculoskeletal or joint aches or pains. His IPSS was 14, indicating moderate urinary symptoms with frequency, urgency, hesitancy, intermittency and weak stream but not bothersome enough to warrant medication at this point. His SHIM was 10, indicating he has moderate-severe erectile dysfunction. A complete review of systems is obtained and is otherwise negative.    PHYSICAL EXAM:  Wt Readings from Last 3 Encounters:  06/20/20 169 lb 6 oz (76.8 kg)  04/11/20 168 lb  6.4 oz (76.4 kg)  10/16/19 173 lb 12.8 oz (78.8 kg)   Temp Readings from Last 3 Encounters:  06/20/20 (!) 97 F (36.1 C) (Temporal)  04/11/20 97.6 F (36.4 C) (Other (Comment))  09/05/19 98.2 F (36.8 C)   BP Readings from Last 3 Encounters:  06/20/20 127/65  04/11/20 120/64  10/16/19 (!) 142/70   Pulse Readings from Last 3 Encounters:  06/20/20 89  04/11/20 82  10/16/19 91   Pain Assessment Pain Score: 0-No pain/10  In general this is a well appearing Caucasian male in no acute distress. He is alert and oriented x4 and appropriate throughout the examination. HEENT reveals that the patient is normocephalic, atraumatic. EOMs are intact. PERRLA. Skin is intact without any evidence of gross lesions.  Cardiopulmonary assessment is negative for acute distress and he exhibits normal effort. The abdomen is soft, non tender, non distended. Lower extremities are negative for pretibial pitting edema, deep calf tenderness, cyanosis or clubbing.  KPS = 100  100 - Normal; no complaints; no evidence of disease. 90   - Able to carry on normal activity; minor signs or symptoms of disease. 80   - Normal activity with effort; some signs or symptoms of disease. 79   - Cares for self; unable to carry on normal activity or to do active work. 60   - Requires occasional assistance, but is able to care for most of his personal needs. 50   - Requires considerable assistance and frequent medical care. 8   - Disabled; requires special care and assistance. 6   - Severely disabled; hospital admission is indicated although death not imminent. 44   - Very sick; hospital admission necessary; active supportive treatment necessary. 10   - Moribund; fatal processes progressing rapidly. 0     - Dead  Karnofsky DA, Abelmann WH, Craver LS and Burchenal [facility] [phone]) The use of the nitrogen mustards in the palliative treatment of carcinoma: with particular reference to bronchogenic carcinoma Cancer 1  634-56  LABORATORY DATA:  Lab Results  Component Value Date   WBC 4.1 09/01/2019   HGB 13.4 09/01/2019   HCT 37.9 (L) 09/01/2019   MCV 93.8 09/01/2019   PLT 145 (L) 09/01/2019   Lab Results  Component Value Date   NA 135 04/11/2020   K 4.6 04/11/2020   CL 98 04/11/2020   CO2 33 (H) 04/11/2020   Lab Results  Component Value Date   ALT 23 04/11/2020   AST 23 04/11/2020   ALKPHOS 70 04/11/2020   BILITOT 0.6 04/11/2020     RADIOGRAPHY: NM Bone Scan Whole Body  Result Date: 05/21/2020 CLINICAL DATA:  New diagnosis of prostate cancer. PSA equal 4.5. No bone pain. Prior lumbar laminectomy from L1-L4. EXAM: NUCLEAR MEDICINE WHOLE BODY BONE SCAN TECHNIQUE: Whole body anterior and posterior images were obtained approximately 3 hours after intravenous injection of radiopharmaceutical. RADIOPHARMACEUTICALS:  21.0 mCi Technetium-88mMDP IV COMPARISON:  CT abdomen pelvis 05/21/2020.  CT chest, 03/28/2019 FINDINGS: Single focus of uptake within the posterior LEFT aspect of the T8 vertebral body. No corresponding findings on CT chest from 03/28/2019. Uptake within the LEFT aspect of the posterior lumbar spine from L1-L4 consistent prior surgery. Significant osteophytosis and endplate changes through these levels comparison CT. Degenerative uptake noted in the LEFT wrist and LEFT foot tarsal bones. IMPRESSION: 1. Single focus of indeterminate uptake in the posterior LEFT aspect of the T8 vertebral body. Further investigation with CT thorax, contrast MRI or F 18 PSMA PET-CT scan may add further characterization. 2. No additional evidence of metastatic disease. 3. Postsurgical uptake in lumbar spine. 4. Electronically Signed   By: SSuzy BouchardM.D.   On: 05/21/2020 15:31      IMPRESSION/PLAN: 1. 79y.o. 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 We discussed the patient's workup and outlined the nature of prostate cancer in this setting. The  patient's T stage, Gleason's score, and PSA put him into the very high risk group. Accordingly, he is eligible for a variety of potential treatment options including prostatectomy or LT-ADT in combination wit either 8 weeks of daily external radiation or 5 weeks of external radiation with an upfront brachytherapy boost.given his advanced age and underlying interstitial lung disease, surgery is not recommended.  We discussed the available radiation techniques, and focused on the details and logistics of delivery. We discussed and outlined the risks, benefits, short and long-term effects associated with radiotherapy and compared and contrasted these with prostatectomy. We discussed the role of SpaceOAR gel in reducing the rectal toxicity associated with radiotherapy. We also detailed the role of ADT in the treatment of high risk prostate cancer and outlined the associated side effects that could be expected with this therapy.  He appears to have a good understanding of his disease and our treatment recommendations which are of curative intent.  He and his family were encouraged to ask questions that were answered to their stated satisfaction.  At the conclusion of our conversation, the patient is interested in moving forward with 8 weeks of external beam therapy concurrent with LT-ADT. He has not received his first Lupron injection so we will share our discussion with Dr. MTresa Mooreand make arrangements for the start of ADT now.  Given his very aggressive prostate cancer, we prefer to have ADT started very soon. He understands the rationale behind delaying the start of radiation for approximately 2 months after the start of ADT to allow for the radiosensitizing effect of this therapy.  Therefore, we will also coordinate for fiducial markers and SpaceOAR gel placement in late February 2022, prior to CT simulation, in anticipation of beginning IMRT approximately 2 months after starting ADT. We enjoyed meeting him and his  family today and look forward to continuing to participate in his care.    Nicholos Johns, PA-C    Tyler Pita, MD  [facility] Direct Dial: [phone]  Fax: [phone] [email or web address]  Skype  LinkedIn

## 2020-06-25 DIAGNOSIS — M6530 Trigger finger, unspecified finger: Secondary | ICD-10-CM | POA: Diagnosis not present

## 2020-06-25 DIAGNOSIS — M79642 Pain in left hand: Secondary | ICD-10-CM | POA: Diagnosis not present

## 2020-06-25 DIAGNOSIS — M79641 Pain in right hand: Secondary | ICD-10-CM | POA: Diagnosis not present

## 2020-07-06 DIAGNOSIS — C61 Malignant neoplasm of prostate: Secondary | ICD-10-CM

## 2020-07-06 HISTORY — DX: Malignant neoplasm of prostate: C61

## 2020-07-09 ENCOUNTER — Ambulatory Visit: Payer: PPO | Admitting: Radiation Oncology

## 2020-07-09 ENCOUNTER — Ambulatory Visit: Payer: PPO

## 2020-07-09 DIAGNOSIS — M65331 Trigger finger, right middle finger: Secondary | ICD-10-CM | POA: Diagnosis not present

## 2020-07-09 DIAGNOSIS — M65332 Trigger finger, left middle finger: Secondary | ICD-10-CM | POA: Diagnosis not present

## 2020-07-09 DIAGNOSIS — M79642 Pain in left hand: Secondary | ICD-10-CM | POA: Diagnosis not present

## 2020-07-09 DIAGNOSIS — M79641 Pain in right hand: Secondary | ICD-10-CM | POA: Diagnosis not present

## 2020-07-11 ENCOUNTER — Encounter: Payer: Self-pay | Admitting: Medical Oncology

## 2020-07-11 ENCOUNTER — Telehealth: Payer: Self-pay | Admitting: *Deleted

## 2020-07-11 DIAGNOSIS — C61 Malignant neoplasm of prostate: Secondary | ICD-10-CM | POA: Diagnosis not present

## 2020-07-11 NOTE — Progress Notes (Signed)
Spoke with patient to introduce myself as the prostate nurse navigator and discuss my role. I confirmed he received his ADT injection today. We discussed ADT and  it's role in prostate cancer.  I informed him he will not start treatment for approximately  8 weeks post injection. Enid Derry will be scheduling appointment for  gold markers/SpaceOar in late February and beginning radiation early March. All questions were answered. I gave him my contact information and asked him to call me with questions or concerns. He voiced understanding.

## 2020-07-11 NOTE — Telephone Encounter (Signed)
Called patient to inform that I have called [facility] and asked that they schedule fid. markers and space oar gel and give me a call with the appt. date and time

## 2020-07-12 ENCOUNTER — Ambulatory Visit: Payer: PPO | Admitting: Radiation Oncology

## 2020-07-12 ENCOUNTER — Ambulatory Visit: Payer: PPO

## 2020-07-17 ENCOUNTER — Telehealth: Payer: Self-pay | Admitting: *Deleted

## 2020-07-17 DIAGNOSIS — Z658 Other specified problems related to psychosocial circumstances: Secondary | ICD-10-CM | POA: Diagnosis not present

## 2020-07-17 DIAGNOSIS — J84112 Idiopathic pulmonary fibrosis: Secondary | ICD-10-CM | POA: Diagnosis not present

## 2020-07-17 DIAGNOSIS — C61 Malignant neoplasm of prostate: Secondary | ICD-10-CM | POA: Diagnosis not present

## 2020-07-17 DIAGNOSIS — M48061 Spinal stenosis, lumbar region without neurogenic claudication: Secondary | ICD-10-CM | POA: Diagnosis not present

## 2020-07-17 DIAGNOSIS — I712 Thoracic aortic aneurysm, without rupture: Secondary | ICD-10-CM | POA: Diagnosis not present

## 2020-07-17 DIAGNOSIS — I2510 Atherosclerotic heart disease of native coronary artery without angina pectoris: Secondary | ICD-10-CM | POA: Diagnosis not present

## 2020-07-17 DIAGNOSIS — R7301 Impaired fasting glucose: Secondary | ICD-10-CM | POA: Diagnosis not present

## 2020-07-17 DIAGNOSIS — E785 Hyperlipidemia, unspecified: Secondary | ICD-10-CM | POA: Diagnosis not present

## 2020-07-17 NOTE — Telephone Encounter (Signed)
CALLED PATIENT TO INFORM OF FID. MARKERS AND SPACE OAR PLACEMENT ON  08-29-20 @ [facility] SIM ON 09-03-20 @ DR. MANNING'S OFFICE, SPOKE WITH PATIENT AND HE IS AWARE OF THESE APPTS.

## 2020-07-23 ENCOUNTER — Telehealth (INDEPENDENT_AMBULATORY_CARE_PROVIDER_SITE_OTHER): Payer: Self-pay

## 2020-07-24 DIAGNOSIS — R110 Nausea: Secondary | ICD-10-CM | POA: Diagnosis not present

## 2020-07-24 DIAGNOSIS — J849 Interstitial pulmonary disease, unspecified: Secondary | ICD-10-CM | POA: Diagnosis not present

## 2020-07-24 DIAGNOSIS — C61 Malignant neoplasm of prostate: Secondary | ICD-10-CM | POA: Diagnosis not present

## 2020-07-24 DIAGNOSIS — K449 Diaphragmatic hernia without obstruction or gangrene: Secondary | ICD-10-CM | POA: Diagnosis not present

## 2020-07-24 DIAGNOSIS — R0609 Other forms of dyspnea: Secondary | ICD-10-CM | POA: Diagnosis not present

## 2020-07-24 DIAGNOSIS — Z79899 Other long term (current) drug therapy: Secondary | ICD-10-CM | POA: Diagnosis not present

## 2020-07-24 DIAGNOSIS — J84112 Idiopathic pulmonary fibrosis: Secondary | ICD-10-CM | POA: Diagnosis not present

## 2020-07-24 DIAGNOSIS — K219 Gastro-esophageal reflux disease without esophagitis: Secondary | ICD-10-CM | POA: Diagnosis not present

## 2020-07-24 DIAGNOSIS — K589 Irritable bowel syndrome without diarrhea: Secondary | ICD-10-CM | POA: Diagnosis not present

## 2020-07-24 DIAGNOSIS — R0602 Shortness of breath: Secondary | ICD-10-CM | POA: Diagnosis not present

## 2020-07-25 ENCOUNTER — Telehealth (INDEPENDENT_AMBULATORY_CARE_PROVIDER_SITE_OTHER): Payer: Self-pay

## 2020-08-15 DIAGNOSIS — M65332 Trigger finger, left middle finger: Secondary | ICD-10-CM | POA: Diagnosis not present

## 2020-08-15 DIAGNOSIS — M65331 Trigger finger, right middle finger: Secondary | ICD-10-CM | POA: Diagnosis not present

## 2020-08-15 DIAGNOSIS — M79642 Pain in left hand: Secondary | ICD-10-CM | POA: Diagnosis not present

## 2020-08-15 DIAGNOSIS — M79641 Pain in right hand: Secondary | ICD-10-CM | POA: Diagnosis not present

## 2020-08-29 DIAGNOSIS — C61 Malignant neoplasm of prostate: Secondary | ICD-10-CM | POA: Diagnosis not present

## 2020-09-02 ENCOUNTER — Telehealth: Payer: Self-pay | Admitting: *Deleted

## 2020-09-02 NOTE — Telephone Encounter (Signed)
CALLED PATIENT TO REMIND OF SIM APPT. FOR 09-03-20 - ARRIVAL TIME- 8:15 AM @ [facility], SPOKE WITH PATIENT AND HE IS AWARE OF THIS APPT.

## 2020-09-03 ENCOUNTER — Other Ambulatory Visit: Payer: Self-pay

## 2020-09-03 ENCOUNTER — Ambulatory Visit
Admission: RE | Admit: 2020-09-03 | Discharge: 2020-09-03 | Disposition: A | Discharge status: Home / Self Care | Payer: PPO | Source: Ambulatory Visit | Attending: Radiation Oncology | Admitting: Radiation Oncology

## 2020-09-03 ENCOUNTER — Encounter: Payer: Self-pay | Admitting: Medical Oncology

## 2020-09-03 DIAGNOSIS — Z510 Encounter for antineoplastic radiation therapy: Secondary | ICD-10-CM | POA: Diagnosis not present

## 2020-09-03 DIAGNOSIS — C61 Malignant neoplasm of prostate: Secondary | ICD-10-CM | POA: Insufficient documentation

## 2020-09-03 NOTE — Progress Notes (Signed)
  Radiation Oncology         [phone] ________________________________  Name: Kenneth [name] MRN: [id number]  Date: 09/03/2020  DOB: 1941/06/04  SIMULATION AND TREATMENT PLANNING NOTE    ICD-10-CM   1. Malignant neoplasm of prostate ([place])  C61     DIAGNOSIS:  80 y.o. 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  NARRATIVE:  The patient was brought to the [facility].  Identity was confirmed.  All relevant records and images related to the planned course of therapy were reviewed.  The patient freely provided informed written consent to proceed with treatment after reviewing the details related to the planned course of therapy. The consent form was witnessed and verified by the simulation staff.  Then, the patient was set-up in a stable reproducible supine position for radiation therapy.  A vacuum lock pillow device was custom fabricated to position his legs in a reproducible immobilized position.  Then, I performed a urethrogram under sterile conditions to identify the prostatic bed.  CT images were obtained.  Surface markings were placed.  The CT images were loaded into the planning software.  Then the prostate bed target, pelvic lymph node target and avoidance structures including the rectum, bladder, bowel and hips were contoured.  Treatment planning then occurred.  The radiation prescription was entered and confirmed.  A total of one complex treatment devices were fabricated. I have requested : Intensity Modulated Radiotherapy (IMRT) is medically necessary for this case for the following reason:  Rectal sparing.Marland Kitchen  PLAN:  The patient will receive 45 Gy in 25 fractions of 1.8 Gy, followed by a boost to the prostate to a total dose of 75 Gy with 15 additional fractions of 2 Gy.   ________________________________  Sheral Apley Tammi Klippel, M.D.  This document serves as a record of services personally performed by Tyler Pita,  MD. It was created on his behalf by Wilburn Mylar, a trained medical scribe. The creation of this record is based on the scribe's personal observations and the provider's statements to them. This document has been checked and approved by the attending provider.

## 2020-09-10 DIAGNOSIS — C61 Malignant neoplasm of prostate: Secondary | ICD-10-CM | POA: Diagnosis not present

## 2020-09-10 DIAGNOSIS — Z510 Encounter for antineoplastic radiation therapy: Secondary | ICD-10-CM | POA: Diagnosis not present

## 2020-09-12 ENCOUNTER — Ambulatory Visit
Admission: RE | Admit: 2020-09-12 | Discharge: 2020-09-12 | Disposition: A | Discharge status: Home / Self Care | Payer: PPO | Source: Ambulatory Visit | Attending: Radiation Oncology | Admitting: Radiation Oncology

## 2020-09-12 ENCOUNTER — Other Ambulatory Visit: Payer: Self-pay

## 2020-09-12 DIAGNOSIS — Z510 Encounter for antineoplastic radiation therapy: Secondary | ICD-10-CM | POA: Diagnosis not present

## 2020-09-12 DIAGNOSIS — C61 Malignant neoplasm of prostate: Secondary | ICD-10-CM | POA: Diagnosis not present

## 2020-09-13 ENCOUNTER — Other Ambulatory Visit: Payer: Self-pay

## 2020-09-13 ENCOUNTER — Ambulatory Visit
Admission: RE | Admit: 2020-09-13 | Discharge: 2020-09-13 | Disposition: A | Discharge status: Home / Self Care | Payer: PPO | Source: Ambulatory Visit | Attending: Radiation Oncology | Admitting: Radiation Oncology

## 2020-09-13 DIAGNOSIS — C61 Malignant neoplasm of prostate: Secondary | ICD-10-CM | POA: Diagnosis not present

## 2020-09-13 DIAGNOSIS — Z510 Encounter for antineoplastic radiation therapy: Secondary | ICD-10-CM | POA: Diagnosis not present

## 2020-09-16 ENCOUNTER — Ambulatory Visit
Admission: RE | Admit: 2020-09-16 | Discharge: 2020-09-16 | Disposition: A | Discharge status: Home / Self Care | Payer: PPO | Source: Ambulatory Visit | Attending: Radiation Oncology | Admitting: Radiation Oncology

## 2020-09-16 DIAGNOSIS — M5416 Radiculopathy, lumbar region: Secondary | ICD-10-CM | POA: Diagnosis not present

## 2020-09-16 DIAGNOSIS — C61 Malignant neoplasm of prostate: Secondary | ICD-10-CM

## 2020-09-16 DIAGNOSIS — Z510 Encounter for antineoplastic radiation therapy: Secondary | ICD-10-CM | POA: Diagnosis not present

## 2020-09-16 NOTE — Progress Notes (Signed)
  Radiation Oncology         [phone] ________________________________  Name: Kenneth [name] MRN: [id number]  Date: 09/16/2020  DOB: 07-May-1941  COMPLEX SIMULATION NOTE  NARRATIVE:  The patient returns to simulation today for ongoing radiation therapy.  His planning CT was performed with knees flat, which has not been reprocucible during the initial fractions of treatment.  A new CT study set was employed for the purpose of modified elevated knee treatment planning.  The target and avoidance structures were re-contoured on a new multislice CT study.  Treatment planning then occurred.  I have requested an updated: Intensity Modulated Radiotherapy (IMRT) is medically necessary for this case for the following reason:  Rectal sparing.Marland Kitchen   PLAN:  This modified radiation beam arrangement is intended to continue the current radiation plan.  ------------------------------------------------  Sheral Apley. Tammi Klippel, M.D.

## 2020-09-17 ENCOUNTER — Other Ambulatory Visit: Payer: Self-pay

## 2020-09-17 ENCOUNTER — Ambulatory Visit
Admission: RE | Admit: 2020-09-17 | Discharge: 2020-09-17 | Disposition: A | Discharge status: Home / Self Care | Payer: PPO | Source: Ambulatory Visit | Attending: Radiation Oncology | Admitting: Radiation Oncology

## 2020-09-17 DIAGNOSIS — C61 Malignant neoplasm of prostate: Secondary | ICD-10-CM | POA: Diagnosis not present

## 2020-09-17 DIAGNOSIS — Z13820 Encounter for screening for osteoporosis: Secondary | ICD-10-CM | POA: Diagnosis not present

## 2020-09-17 DIAGNOSIS — M48061 Spinal stenosis, lumbar region without neurogenic claudication: Secondary | ICD-10-CM | POA: Diagnosis not present

## 2020-09-17 DIAGNOSIS — Z510 Encounter for antineoplastic radiation therapy: Secondary | ICD-10-CM | POA: Diagnosis not present

## 2020-09-18 ENCOUNTER — Ambulatory Visit
Admission: RE | Admit: 2020-09-18 | Discharge: 2020-09-18 | Disposition: A | Discharge status: Home / Self Care | Payer: PPO | Source: Ambulatory Visit | Attending: Radiation Oncology | Admitting: Radiation Oncology

## 2020-09-18 DIAGNOSIS — C61 Malignant neoplasm of prostate: Secondary | ICD-10-CM | POA: Diagnosis not present

## 2020-09-18 DIAGNOSIS — Z510 Encounter for antineoplastic radiation therapy: Secondary | ICD-10-CM | POA: Diagnosis not present

## 2020-09-19 ENCOUNTER — Ambulatory Visit
Admission: RE | Admit: 2020-09-19 | Discharge: 2020-09-19 | Disposition: A | Discharge status: Home / Self Care | Payer: PPO | Source: Ambulatory Visit | Attending: Radiation Oncology | Admitting: Radiation Oncology

## 2020-09-19 ENCOUNTER — Other Ambulatory Visit: Payer: Self-pay

## 2020-09-19 DIAGNOSIS — Z510 Encounter for antineoplastic radiation therapy: Secondary | ICD-10-CM | POA: Diagnosis not present

## 2020-09-19 DIAGNOSIS — C61 Malignant neoplasm of prostate: Secondary | ICD-10-CM | POA: Diagnosis not present

## 2020-09-20 ENCOUNTER — Ambulatory Visit
Admission: RE | Admit: 2020-09-20 | Discharge: 2020-09-20 | Disposition: A | Discharge status: Home / Self Care | Payer: PPO | Source: Ambulatory Visit | Attending: Radiation Oncology | Admitting: Radiation Oncology

## 2020-09-20 ENCOUNTER — Other Ambulatory Visit: Payer: Self-pay

## 2020-09-20 DIAGNOSIS — Z510 Encounter for antineoplastic radiation therapy: Secondary | ICD-10-CM | POA: Diagnosis not present

## 2020-09-20 DIAGNOSIS — C61 Malignant neoplasm of prostate: Secondary | ICD-10-CM | POA: Diagnosis not present

## 2020-09-23 ENCOUNTER — Other Ambulatory Visit: Payer: Self-pay

## 2020-09-23 ENCOUNTER — Ambulatory Visit
Admission: RE | Admit: 2020-09-23 | Discharge: 2020-09-23 | Disposition: A | Discharge status: Home / Self Care | Payer: PPO | Source: Ambulatory Visit | Attending: Radiation Oncology | Admitting: Radiation Oncology

## 2020-09-23 DIAGNOSIS — Z510 Encounter for antineoplastic radiation therapy: Secondary | ICD-10-CM | POA: Diagnosis not present

## 2020-09-23 DIAGNOSIS — C61 Malignant neoplasm of prostate: Secondary | ICD-10-CM | POA: Diagnosis not present

## 2020-09-24 ENCOUNTER — Ambulatory Visit
Admission: RE | Admit: 2020-09-24 | Discharge: 2020-09-24 | Disposition: A | Discharge status: Home / Self Care | Payer: PPO | Source: Ambulatory Visit | Attending: Radiation Oncology | Admitting: Radiation Oncology

## 2020-09-24 DIAGNOSIS — C61 Malignant neoplasm of prostate: Secondary | ICD-10-CM | POA: Diagnosis not present

## 2020-09-24 DIAGNOSIS — Z510 Encounter for antineoplastic radiation therapy: Secondary | ICD-10-CM | POA: Diagnosis not present

## 2020-09-25 ENCOUNTER — Ambulatory Visit: Payer: PPO

## 2020-09-25 DIAGNOSIS — M5416 Radiculopathy, lumbar region: Secondary | ICD-10-CM | POA: Diagnosis not present

## 2020-09-26 ENCOUNTER — Other Ambulatory Visit: Payer: Self-pay

## 2020-09-26 ENCOUNTER — Ambulatory Visit
Admission: RE | Admit: 2020-09-26 | Discharge: 2020-09-26 | Disposition: A | Discharge status: Home / Self Care | Payer: PPO | Source: Ambulatory Visit | Attending: Radiation Oncology | Admitting: Radiation Oncology

## 2020-09-26 DIAGNOSIS — Z510 Encounter for antineoplastic radiation therapy: Secondary | ICD-10-CM | POA: Diagnosis not present

## 2020-09-26 DIAGNOSIS — C61 Malignant neoplasm of prostate: Secondary | ICD-10-CM | POA: Diagnosis not present

## 2020-09-27 ENCOUNTER — Ambulatory Visit
Admission: RE | Admit: 2020-09-27 | Discharge: 2020-09-27 | Disposition: A | Discharge status: Home / Self Care | Payer: PPO | Source: Ambulatory Visit | Attending: Radiation Oncology | Admitting: Radiation Oncology

## 2020-09-27 DIAGNOSIS — Z510 Encounter for antineoplastic radiation therapy: Secondary | ICD-10-CM | POA: Diagnosis not present

## 2020-09-27 DIAGNOSIS — C61 Malignant neoplasm of prostate: Secondary | ICD-10-CM | POA: Diagnosis not present

## 2020-09-30 ENCOUNTER — Ambulatory Visit
Admission: RE | Admit: 2020-09-30 | Discharge: 2020-09-30 | Disposition: A | Discharge status: Home / Self Care | Payer: PPO | Source: Ambulatory Visit | Attending: Radiation Oncology | Admitting: Radiation Oncology

## 2020-09-30 ENCOUNTER — Other Ambulatory Visit: Payer: Self-pay

## 2020-09-30 DIAGNOSIS — C61 Malignant neoplasm of prostate: Secondary | ICD-10-CM | POA: Diagnosis not present

## 2020-09-30 DIAGNOSIS — Z510 Encounter for antineoplastic radiation therapy: Secondary | ICD-10-CM | POA: Diagnosis not present

## 2020-10-01 ENCOUNTER — Ambulatory Visit
Admission: RE | Admit: 2020-10-01 | Discharge: 2020-10-01 | Disposition: A | Discharge status: Home / Self Care | Payer: PPO | Source: Ambulatory Visit | Attending: Radiation Oncology | Admitting: Radiation Oncology

## 2020-10-01 DIAGNOSIS — C61 Malignant neoplasm of prostate: Secondary | ICD-10-CM | POA: Diagnosis not present

## 2020-10-01 DIAGNOSIS — Z510 Encounter for antineoplastic radiation therapy: Secondary | ICD-10-CM | POA: Diagnosis not present

## 2020-10-02 ENCOUNTER — Ambulatory Visit
Admission: RE | Admit: 2020-10-02 | Discharge: 2020-10-02 | Disposition: A | Discharge status: Home / Self Care | Payer: PPO | Source: Ambulatory Visit | Attending: Radiation Oncology | Admitting: Radiation Oncology

## 2020-10-02 ENCOUNTER — Other Ambulatory Visit: Payer: Self-pay

## 2020-10-02 DIAGNOSIS — C61 Malignant neoplasm of prostate: Secondary | ICD-10-CM | POA: Diagnosis not present

## 2020-10-02 DIAGNOSIS — Z510 Encounter for antineoplastic radiation therapy: Secondary | ICD-10-CM | POA: Diagnosis not present

## 2020-10-03 ENCOUNTER — Ambulatory Visit
Admission: RE | Admit: 2020-10-03 | Discharge: 2020-10-03 | Disposition: A | Discharge status: Home / Self Care | Payer: PPO | Source: Ambulatory Visit | Attending: Radiation Oncology | Admitting: Radiation Oncology

## 2020-10-03 DIAGNOSIS — Z510 Encounter for antineoplastic radiation therapy: Secondary | ICD-10-CM | POA: Diagnosis not present

## 2020-10-03 DIAGNOSIS — C61 Malignant neoplasm of prostate: Secondary | ICD-10-CM | POA: Diagnosis not present

## 2020-10-04 ENCOUNTER — Other Ambulatory Visit: Payer: Self-pay

## 2020-10-04 ENCOUNTER — Ambulatory Visit
Admission: RE | Admit: 2020-10-04 | Discharge: 2020-10-04 | Disposition: A | Discharge status: Home / Self Care | Payer: PPO | Source: Ambulatory Visit | Attending: Radiation Oncology | Admitting: Radiation Oncology

## 2020-10-04 ENCOUNTER — Telehealth (INDEPENDENT_AMBULATORY_CARE_PROVIDER_SITE_OTHER): Payer: Self-pay

## 2020-10-04 DIAGNOSIS — C61 Malignant neoplasm of prostate: Secondary | ICD-10-CM | POA: Insufficient documentation

## 2020-10-04 DIAGNOSIS — Z510 Encounter for antineoplastic radiation therapy: Secondary | ICD-10-CM | POA: Insufficient documentation

## 2020-10-04 NOTE — Telephone Encounter (Signed)
Dr. Annamaria Boots has called in to inform me that his Kenneth [name] has ran out. I called [facility]. And they stated he needs a new Rx sent in. I informed Dr. Annamaria Boots of this and that we may need to see him since we have not seen him in office in almost 2 years.

## 2020-10-07 ENCOUNTER — Ambulatory Visit
Admission: RE | Admit: 2020-10-07 | Discharge: 2020-10-07 | Disposition: A | Discharge status: Home / Self Care | Payer: PPO | Source: Ambulatory Visit | Attending: Radiation Oncology | Admitting: Radiation Oncology

## 2020-10-07 ENCOUNTER — Other Ambulatory Visit: Payer: Self-pay

## 2020-10-07 DIAGNOSIS — C61 Malignant neoplasm of prostate: Secondary | ICD-10-CM | POA: Diagnosis not present

## 2020-10-07 DIAGNOSIS — Z510 Encounter for antineoplastic radiation therapy: Secondary | ICD-10-CM | POA: Diagnosis not present

## 2020-10-08 ENCOUNTER — Ambulatory Visit
Admission: RE | Admit: 2020-10-08 | Discharge: 2020-10-08 | Disposition: A | Discharge status: Home / Self Care | Payer: PPO | Source: Ambulatory Visit | Attending: Radiation Oncology | Admitting: Radiation Oncology

## 2020-10-08 DIAGNOSIS — C61 Malignant neoplasm of prostate: Secondary | ICD-10-CM | POA: Diagnosis not present

## 2020-10-08 DIAGNOSIS — Z510 Encounter for antineoplastic radiation therapy: Secondary | ICD-10-CM | POA: Diagnosis not present

## 2020-10-09 ENCOUNTER — Ambulatory Visit
Admission: RE | Admit: 2020-10-09 | Discharge: 2020-10-09 | Disposition: A | Discharge status: Home / Self Care | Payer: PPO | Source: Ambulatory Visit | Attending: Radiation Oncology | Admitting: Radiation Oncology

## 2020-10-09 ENCOUNTER — Other Ambulatory Visit: Payer: Self-pay

## 2020-10-09 DIAGNOSIS — Z510 Encounter for antineoplastic radiation therapy: Secondary | ICD-10-CM | POA: Diagnosis not present

## 2020-10-09 DIAGNOSIS — C61 Malignant neoplasm of prostate: Secondary | ICD-10-CM | POA: Diagnosis not present

## 2020-10-10 ENCOUNTER — Ambulatory Visit
Admission: RE | Admit: 2020-10-10 | Discharge: 2020-10-10 | Disposition: A | Discharge status: Home / Self Care | Payer: PPO | Source: Ambulatory Visit | Attending: Radiation Oncology | Admitting: Radiation Oncology

## 2020-10-10 DIAGNOSIS — Z510 Encounter for antineoplastic radiation therapy: Secondary | ICD-10-CM | POA: Diagnosis not present

## 2020-10-10 DIAGNOSIS — C61 Malignant neoplasm of prostate: Secondary | ICD-10-CM | POA: Diagnosis not present

## 2020-10-11 ENCOUNTER — Other Ambulatory Visit: Payer: Self-pay

## 2020-10-11 ENCOUNTER — Ambulatory Visit
Admission: RE | Admit: 2020-10-11 | Discharge: 2020-10-11 | Disposition: A | Discharge status: Home / Self Care | Payer: PPO | Source: Ambulatory Visit | Attending: Radiation Oncology | Admitting: Radiation Oncology

## 2020-10-11 DIAGNOSIS — Z510 Encounter for antineoplastic radiation therapy: Secondary | ICD-10-CM | POA: Diagnosis not present

## 2020-10-11 DIAGNOSIS — C61 Malignant neoplasm of prostate: Secondary | ICD-10-CM | POA: Diagnosis not present

## 2020-10-14 ENCOUNTER — Ambulatory Visit
Admission: RE | Admit: 2020-10-14 | Discharge: 2020-10-14 | Disposition: A | Discharge status: Home / Self Care | Payer: PPO | Source: Ambulatory Visit | Attending: Radiation Oncology | Admitting: Radiation Oncology

## 2020-10-14 ENCOUNTER — Other Ambulatory Visit: Payer: Self-pay

## 2020-10-14 DIAGNOSIS — Z510 Encounter for antineoplastic radiation therapy: Secondary | ICD-10-CM | POA: Diagnosis not present

## 2020-10-14 DIAGNOSIS — C61 Malignant neoplasm of prostate: Secondary | ICD-10-CM | POA: Diagnosis not present

## 2020-10-15 ENCOUNTER — Encounter: Payer: Self-pay | Admitting: Internal Medicine

## 2020-10-15 ENCOUNTER — Ambulatory Visit
Admission: RE | Admit: 2020-10-15 | Discharge: 2020-10-15 | Disposition: A | Discharge status: Home / Self Care | Payer: PPO | Source: Ambulatory Visit | Attending: Radiation Oncology | Admitting: Radiation Oncology

## 2020-10-15 DIAGNOSIS — C61 Malignant neoplasm of prostate: Secondary | ICD-10-CM | POA: Diagnosis not present

## 2020-10-15 DIAGNOSIS — Z510 Encounter for antineoplastic radiation therapy: Secondary | ICD-10-CM | POA: Diagnosis not present

## 2020-10-16 ENCOUNTER — Ambulatory Visit
Admission: RE | Admit: 2020-10-16 | Discharge: 2020-10-16 | Disposition: A | Discharge status: Home / Self Care | Payer: PPO | Source: Ambulatory Visit | Attending: Radiation Oncology | Admitting: Radiation Oncology

## 2020-10-16 ENCOUNTER — Other Ambulatory Visit: Payer: Self-pay

## 2020-10-16 DIAGNOSIS — Z510 Encounter for antineoplastic radiation therapy: Secondary | ICD-10-CM | POA: Diagnosis not present

## 2020-10-16 DIAGNOSIS — C61 Malignant neoplasm of prostate: Secondary | ICD-10-CM | POA: Diagnosis not present

## 2020-10-17 ENCOUNTER — Ambulatory Visit
Admission: RE | Admit: 2020-10-17 | Discharge: 2020-10-17 | Disposition: A | Discharge status: Home / Self Care | Payer: PPO | Source: Ambulatory Visit | Attending: Radiation Oncology | Admitting: Radiation Oncology

## 2020-10-17 ENCOUNTER — Ambulatory Visit: Payer: PPO

## 2020-10-17 DIAGNOSIS — Z510 Encounter for antineoplastic radiation therapy: Secondary | ICD-10-CM | POA: Diagnosis not present

## 2020-10-17 DIAGNOSIS — C61 Malignant neoplasm of prostate: Secondary | ICD-10-CM | POA: Diagnosis not present

## 2020-10-18 ENCOUNTER — Other Ambulatory Visit: Payer: Self-pay

## 2020-10-18 ENCOUNTER — Ambulatory Visit: Payer: PPO

## 2020-10-18 ENCOUNTER — Other Ambulatory Visit (HOSPITAL_COMMUNITY)
Admission: RE | Admit: 2020-10-18 | Discharge: 2020-10-18 | Disposition: A | Discharge status: Home / Self Care | Payer: PPO | Source: Ambulatory Visit | Attending: Internal Medicine | Admitting: Internal Medicine

## 2020-10-18 ENCOUNTER — Ambulatory Visit
Admission: RE | Admit: 2020-10-18 | Discharge: 2020-10-18 | Disposition: A | Discharge status: Home / Self Care | Payer: PPO | Source: Ambulatory Visit | Attending: Radiation Oncology | Admitting: Radiation Oncology

## 2020-10-18 DIAGNOSIS — Z01812 Encounter for preprocedural laboratory examination: Secondary | ICD-10-CM | POA: Insufficient documentation

## 2020-10-18 DIAGNOSIS — Z20822 Contact with and (suspected) exposure to covid-19: Secondary | ICD-10-CM | POA: Diagnosis not present

## 2020-10-18 DIAGNOSIS — Z510 Encounter for antineoplastic radiation therapy: Secondary | ICD-10-CM | POA: Diagnosis not present

## 2020-10-18 DIAGNOSIS — C61 Malignant neoplasm of prostate: Secondary | ICD-10-CM | POA: Diagnosis not present

## 2020-10-18 LAB — SARS CORONAVIRUS 2 (TAT 6-24 HRS): SARS Coronavirus 2: NEGATIVE

## 2020-10-21 ENCOUNTER — Ambulatory Visit (INDEPENDENT_AMBULATORY_CARE_PROVIDER_SITE_OTHER): Payer: PPO | Admitting: Internal Medicine

## 2020-10-21 ENCOUNTER — Other Ambulatory Visit: Payer: Self-pay

## 2020-10-21 ENCOUNTER — Ambulatory Visit: Payer: PPO | Admitting: Internal Medicine

## 2020-10-21 ENCOUNTER — Ambulatory Visit
Admission: RE | Admit: 2020-10-21 | Discharge: 2020-10-21 | Disposition: A | Discharge status: Home / Self Care | Payer: PPO | Source: Ambulatory Visit | Attending: Radiation Oncology | Admitting: Radiation Oncology

## 2020-10-21 ENCOUNTER — Encounter: Payer: PPO | Admitting: *Deleted

## 2020-10-21 DIAGNOSIS — J84112 Idiopathic pulmonary fibrosis: Secondary | ICD-10-CM | POA: Diagnosis not present

## 2020-10-21 DIAGNOSIS — C61 Malignant neoplasm of prostate: Secondary | ICD-10-CM | POA: Diagnosis not present

## 2020-10-21 DIAGNOSIS — Z006 Encounter for examination for normal comparison and control in clinical research program: Secondary | ICD-10-CM

## 2020-10-21 DIAGNOSIS — Z510 Encounter for antineoplastic radiation therapy: Secondary | ICD-10-CM | POA: Diagnosis not present

## 2020-10-21 LAB — PULMONARY FUNCTION TEST
DL/VA % pred: 80 %
DL/VA: 3.18 ml/min/mmHg/L
DLCO cor % pred: 56 %
DLCO cor: 13.64 ml/min/mmHg
DLCO unc % pred: 56 %
DLCO unc: 13.64 ml/min/mmHg
FEF 25-75 Pre: 2.77 L/sec
FEF2575-%Pred-Pre: 139 %
FEV1-%Pred-Pre: 91 %
FEV1-Pre: 2.59 L
FEV1FVC-%Pred-Pre: 112 %
FEV6-%Pred-Pre: 85 %
FEV6-Pre: 3.16 L
FEV6FVC-%Pred-Pre: 105 %
FVC-%Pred-Pre: 81 %
FVC-Pre: 3.2 L
Pre FEV1/FVC ratio: 81 %
Pre FEV6/FVC Ratio: 99 %

## 2020-10-21 NOTE — Progress Notes (Signed)
Placed PFT order

## 2020-10-21 NOTE — Progress Notes (Signed)
Spirometry and DLCO completed today  ?

## 2020-10-21 NOTE — Research (Signed)
IPF-PRO Registry  Purpose: To collect data and biological samples that will support future research studies.  The goal of the registry is to research the current approaches to diagnosis, treatment, and progression of IPF. In addition, the registry will analyze participant characteristics, assess quality of life, describe participants interactions with the health care system, determine IPF treatment practices across multiple institutions, and utilize biological samples to identify disease biomarkers.   Clinical Research Coordinator / Research RN note : This visit for [name] with DOB: [birth year] on 10/21/2020 for the above protocol is Visit/Encounter #7  and is for purpose of research.   The consent for this encounter is under Protocol Version Amendment 4 (Version Date: 17 March 2018 and is currently IRB approved.   Subject expressed continued interest and consent in continuing as a study subject. Subject confirmed that there was no change in contact information (e.g. address, telephone, email). Subject thanked for participation in research and contribution to science.    During this visit on 10/21/2020, the subject completed the blood work and questionnaires per the above referenced protocol. Please refer to the subject's paper source binder for further details.   Signed by  [place] Coordinator  [facility]  [place], [place] 10:54 AM 10/21/2020

## 2020-10-22 ENCOUNTER — Ambulatory Visit
Admission: RE | Admit: 2020-10-22 | Discharge: 2020-10-22 | Disposition: A | Discharge status: Home / Self Care | Payer: PPO | Source: Ambulatory Visit | Attending: Radiation Oncology | Admitting: Radiation Oncology

## 2020-10-22 ENCOUNTER — Ambulatory Visit: Payer: PPO | Admitting: Internal Medicine

## 2020-10-22 ENCOUNTER — Encounter: Payer: Self-pay | Admitting: Internal Medicine

## 2020-10-22 VITALS — BP 118/74 | HR 94 | Temp 97.4°F | Ht 69.0 in | Wt 170.1 lb

## 2020-10-22 DIAGNOSIS — J849 Interstitial pulmonary disease, unspecified: Secondary | ICD-10-CM

## 2020-10-22 DIAGNOSIS — Z510 Encounter for antineoplastic radiation therapy: Secondary | ICD-10-CM | POA: Diagnosis not present

## 2020-10-22 DIAGNOSIS — C61 Malignant neoplasm of prostate: Secondary | ICD-10-CM | POA: Diagnosis not present

## 2020-10-22 LAB — HEPATIC FUNCTION PANEL
ALT: 21 U/L (ref 0–53)
AST: 21 U/L (ref 0–37)
Albumin: 3.6 g/dL (ref 3.5–5.2)
Alkaline Phosphatase: 58 U/L (ref 39–117)
Bilirubin, Direct: 0.1 mg/dL (ref 0.0–0.3)
Total Bilirubin: 0.4 mg/dL (ref 0.2–1.2)
Total Protein: 6.2 g/dL (ref 6.0–8.3)

## 2020-10-22 NOTE — Patient Instructions (Addendum)
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[facility])  -slowy progressive disease - some of fatigue and IBS can be from radiation from prostate  Plan  - continue esbriet as dsicussed =- check LFT 10/22/2020 - keep [facility] appt in July 2022  - spiro and dlco in Aug/sept 2022 with [place] - continue fitness with o2 monitoring - will refer you to [facility] for interventional trials   - will pre-screen based on Prostate cancer exclusion   Followup  - 4-5 months ILD clinic 30 min slot but after spiro/dlco

## 2020-10-22 NOTE — Progress Notes (Signed)
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 He tells me that this summer 2018*noticing insidious onset of shortness of breath particularly with walking stairs in the house or [place]. This was not that in the previous years. The summer 2018 he was in [place] but did not notice much shortness of breath. Otherwise he is able to do his activities of daily living and played golf with a car. He is more bothered by his back pain and sciatica as a result of previous back surgery. Shortness of breath is extremely mild. He says he became more aware of it after the radiologic investigations documented below. He had a chest x-ray that suggested interstitial findings and therefore he underwent a high-resolution CT scan of the chest that is described below. I personally visualized this high resolution CT chest and to me it shows bilateral bibasal subpleural reticulation that also extends to the upper lobes without any zonal predominance. There is no obvious honeycombing but there is traction bronchiectasis. There no mediastinal adenopathy. Therefore he is been referred here. In terms of exposure history other than radiation exposure he has not been on any pulmonary toxic drugs or any mold exposure or asbestos exposure. Does have GERD and is on PPI and is well ocntrolled   Does have spring and perennial allergies - sneezes easy. Allergy test with [facility] negative some years ago  [employer] chest physicians interstitial lung disease questionnaire: He says that he does not cough. He is only trouble with dyspnea with strenuous exercise and it started 4 months ago. Past medical history significant for acid reflux. He has never smoked any tobacco or street drugs. Does not have any family history of lung disease. At  home he does not have any humidifier sound birds heart the water damage or mold. 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 He has standard radiation exposure while at work. The standard radiation for a radiologist. He does not take any pulmonary toxic drugs. House is 79 years old and he worries about mold. He sneezes if air is forced   Pulmonary function test shows isolated reduction in diffusion capacity to 62%. Walking desaturation test underneath her feet 3 laps on room air: Resting pulse ox 96%. Final pulse ox 95%. Heart rate resting was 92/m and went up to 109 minute.   Results for [name], [name] (MRN [id number]) as of 03/23/2017 10:04  Ref. Range 03/04/2017 09:13  FVC-Pre Latest Units: L 3.59  FVC-%Pred-Pre Latest Units: % 86  Results for [name], [name] (MRN [id number]) as of 03/23/2017 10:04  Ref. Range 03/04/2017 09:13  DLCO cor Latest Units: ml/min/mmHg 20.06  DLCO cor % pred Latest Units: % 63    Lungs/Pleura: There is subpleural reticulation with scattered ground-glass and traction bronchiolectasis, without a definite zonal predominance. No definitive honeycombing. Findings appear mildly progressive when radiographs dating back to 12/28/2008 are reviewed. Probable 5 mm subpleural lymph node along the right major fissure. No pleural fluid. Airway is unremarkable.      IMPRESSION: 1. Pulmonary parenchymal findings of interstitial lung disease which may be due to fibrotic nonspecific interstitial pneumonitis or, given slight progression over time, usual interstitial pneumonitis. 2. Aortic atherosclerosis (ICD10-170.0). Coronary artery calcification. 3. Aortic aneurysm NOS (ICD10-I71.9).  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 Recommend annual imaging followup by CTA or MRA. This recommendation follows 2010 ACCF/AHA/AATS/ACR/ASA/SCA/SCAI/SIR/STS/SVM Guidelines for the Diagnosis and Management of Patients with Thoracic  Aortic Disease. Circulation. 2010; 121: Z169-C789. 4. Prominence of the right and left main pulmonary arteries can be seen with pulmonary arterial hypertension.   Electronically Signed   By: [name]   On: 02/17/2017 13:41     OV 03/23/2017  Chief Complaint  Patient presents with  . Follow-up    SOB on exertion. Other than that he has been doing about the same. Denies any cough or CP.   Here to discuss results. [name]. 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 In inteirm no new symptoms.  Autoimmune profile negative. They raise possibility of 2nd opinion.  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 Progression is based on CXR comparison since 2010; there is no CT and is only suspicion of progression  Results for ROLLAN, ROGER (MRN [id number]) as of 03/23/2017 10:04  Ref. Range 12/08/2011 16:44 03/04/2017 13:47  Anit Nuclear Antibody(ANA) Latest Ref Range: NEGATIVE   NEG  Angiotensin-Converting Enzyme Latest Ref Range: 9 - 67 U/L  31  Cyclic Citrullin Peptide Ab Latest Units: Units  <16  ds DNA Ab Latest Units: IU/mL  <1  ENA RNP Ab Latest Ref Range: 0.0 - 0.9 AI  0.2  Endomysial Screen Latest Ref Range: NEGATIVE  NEGATIVE   Deamidated Gliadin Abs, IgG Latest Ref Range: <20 U/mL 4.1   Gliadin IgA Latest Ref Range: <20 U/mL 1.8   RA Latex Turbid. Latest Ref Range: <14 IU/mL  <14  Tissue Transglut Ab Latest Ref Range: <20 U/mL 3.9   Tissue Transglutaminase Ab, IgA Latest Ref Range: <20 U/mL 2.2   IgE (Immunoglobulin E), Serum Latest Ref Range: <115 kU/L  31  IgA Latest Ref Range: 68 - 379 mg/dL 132   SSA (Ro) (ENA) Antibody, IgG Latest Ref Range: <1.0 NEG AI   <1.0 NEG  SSB (La) (ENA) Antibody, IgG Latest Ref Range: <1.0 NEG AI   <1.0 NEG    OV 05/06/2017  Chief Complaint  Patient presents with  . Follow-up    Follow up today after beginning [place]. Pt has no c/o SOB,  cough, or CP. Has had some mild nausea from the [place] but other than that, he has been doing good on it.   ollow-up idiopathic pulmonary fibrosis mild severity  He is here to follow-up He is on the third week of his bed at 3 pills 3 times a day max dose. So far is tolerating it fine other than mild nausea. He does take after meals. He plans to meet with [facility] coordinator for medication support. He did visit [place] this past weekend and did have vomiting especially after consuming few etoh drinks at a [employer]. He does apply sunscreen [his daughter is a Paediatric nurse in [place]. He still complains about the fact that and is 80 year old home when the air conditioner comes on and they air blows out of the duct system he does sneeze 10 times and his feet does feel cold. She plans to put hepa filters that are high and and also get the duct cleaning. He says he did have allergy evaluation and found few years ago and it was negative. Recently he has increased his rpinrole and alsogot an injection for his back  and he does feel better   OV 06/22/2017  Chief Complaint  Patient presents with  . Follow-up    Pt still taking Esbriet and has been doing good except has been having issues with nausea x3 days. PFT done today. Still becomes SOB with exertion. Denies any complaints of cough or CP.   IPF followup  Routine followup. Now on esbriet 3 pills tid since end oct 2018. Having new nausea - moderate, x 3 days. Might be chills. But no associated diarrhea or other side effects. Spacing esbriet 4h only. Also has complaitns about high co pay. Unable to find charity. Doing exrcise with trainer - feeels that is better and more vigorous than rehab. Gets tachy with eercise but says he does not desaturate. Had PFT - stable. Lot of questions about disease    Walking desaturation test on 06/22/2017 185 feet x 3 laps on ROOM AIR:  did NOT desaturate. Rest pulse ox was 100%, final pulse ox was 99%. HR  response 84/min at rest to 97/min at peak exertion. Patient JEWEL VENDITTO  Did not Desaturate < 88% . Westley Chandler did not  Desaturated </= 3% points. Higinio A Sikora yes did get tachyardic   OV 09/20/2017  Chief Complaint  Patient presents with  . Follow-up    PFT done today.  Pt states he has been doing good. Pt still taking Esbriet and states he is doing good on it.   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  Joen Laura for IPF follow-up.  Last visit December 2018.  Since then he has been compliant fully with his Pirfenidone (Esbriet).  His nausea resolved with Ginger intake.  However he is having 3-5 pound weight loss and also fatigue towards the end of the day and also low appetite.  He is exercising vigorously 5 times a week and he is wondering if the fatigue could be because of the heavy exercise.  He is not interested in lung transplantation.  He is open to research participation in interventional trial.  Currently he is on the IPF registry program.  Overall he is well.  He did go to [place] to visit his son and he did not wear sunscreen and he did pick up a stage I skin burn with erythema that was more than usual.  But this resolved.  I did remind him about his obligation to wear sunscreen at all times with Pirfenidone (Esbriet).  He is interested in interventional research trials.  His pulmonary function test to me on an average is stable even though there is decline in FVC that seems to be an increase in DLCO so that is variability.  His walking desaturation test is around the same. He is interested in rolling over to the larger capsule of the Pirfenidone (Esbriet) so we will have to take it only 3 times a day. ESberit is cposting him $500/month or more due to high AGI and this is frustrating for him   Walking desaturation test on 09/20/2017 185 feet x 3 laps on ROOM AIR:  did walk briskly all 3 laps with only mild dyspnea desaturate. Rest pulse ox was 98%, final pulse ox was 98%. HR response 96/min at rest to  107/min at peak exertion. Patient ARGENIS KUMARI  Did not Desaturate < 88% . Westley Chandler did not  Desaturated </= 3% points. Guillermina [place] yes did get tachyardic this appears similar to a few months ago   OV 12/07/2017  Chief Complaint  Patient presents with  .  Follow-up    Pt had pre Arlyce Harman and DLCO prior to OV today. Pt has increase of SOB with exertion more with walking up the hill.   Dr. Annamaria Boots presents for follow-up of his IPF to ILD clinic.  In terms of his IPF he feels stable.  In fact he tells me that if not for the incidental chest x-ray and a subsequent CT scan that picked up pulmonary fibrosis he even to this day will not know that he has IPF.  He only has a mild exertional dyspnea class I which she always believes in the past that it was because of aging.  At this point in time he is on Esbriet 1 big pill 3 times daily at full dose.  He is now applying sunscreen and has not had any further sunburns.  He is following an extremely intense exercise fitness regimen to improve his cardiac conditioning.  He believes his exertional heart rate and resting heart rate are much improved than before.  He works with a Clinical research associate at [employer].  His main issue is from Pirfenidone [facility]).  He had some mild nausea that has now resolved with ginger capsules but he continues to have some amount of fatigue low appetite and some mild weight loss.  He says this is mild and all tolerable.  The fatigue happens after working a lot in the yard and he loves yard work.  He wants to know if he can continue to do yard work.  Recently he did have some sinus congestion and took some steroids and antibiotics for it and he feels better but he still has some sinus fullness.  He had spirometry and simple walking desaturation test and the profile is below and I believe these are stable.  In fact his exertional heart rate is improved compared to previous.  Today he also participated in the IPF registry program.  There is a  noninterventional trial.  He is interested in future interventional trials.   OV 03/08/2018  Chief Complaint  Patient presents with  . Follow-up    PFT performed today. Pt states he has been doing well except he has been having problems with nausea and dizziness, and loss of appetite.   Westley Chandler - presents for follow-up. For his IPF. He is on esbruet now. He was tolerating his Pirfenidone (Esbriet) quite well other than minor side effects till last visit. However in thesummer of 2019e's had increased nauseaand also weight loss. He has lost 6 pounds since prior visit. Overall he is lost 11 pounds since September 2018. We started the medication esbeit  anti-fibrotic for him in October 2018. His pants are getting looser.he is also having significant fatigue. Nevertheless he does not want to switch to the of anti-fibrotic ofev because of prior history of irritable bowel syndrome. He says that his baseline irritable bowel syndrome is worse than any side effects he is having with esbriet especially the fatigue.e is open to a transplant evaluation and consideration of research protocols. His spirometry shows stability with FVC but his DLCO might be declining.Marland Kitchen His simple walk test did not show anychange. He is not noticing any subjective changes in dyspnea on effort tolerance while working out although he does admit to increased dizziness particularly when playing golf. During this time recently has not monitored his pulse ox but in the past and never went below 92%.  Other issues: He had some myalgia despite change in his statin. He will address this with his  primary care. 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 Primary care has ordered a follow-up CT scan.This can be combined with a high risk CT scan. I I have reached out to  the thoracic radiology     OV 05/10/2018  Subjective:  Patient ID: Westley Chandler, male , DOB: [birth year] , age 32 y.o. , MRN: [id number] , ADDRESS: [place] [id number]   05/10/2018 -   Chief Complaint  Patient presents with  . Follow-up    review spiro with dlco.  c/o stable doe.  On 848m Esbriet TID, notes occ dizziness, loss of appetite.      HPI KZAI CHMIEL89y.o. -returns-year follow-up.  Diagnosis made towards the end of 2018.  He has been on Esbriet since then.  His main issues have been weight loss.  He lost 10 pounds of weight at the time of last visit.  At the last visit he weighed 174 pounds in September 2019.  Back in August 2018 he weighed 186 pounds.  All this weight loss happen after he started Pirfenidone (Esbriet).  However he is now stabilized and he continues to be 874 pounds.  He is able to do activities of daily living and exercise but when he does stairs he gets dyspneic.  Overall he feels stable in the last 1 year in terms of his effort tolerance.  His main thing is that he has no appetite.  He has been communicating with the Pirfenidone (Esbriet) supporting about how to manage his low appetite and occasional nausea.  We discussed the options about taking brief holidays or reduction in dose.  He is willing to try this.  He does take occasional ginger.  Of note he had a transplant referral [facility] because he was out of network.  He tells me that his insurance company only advised that he would have a higher co-pay so is a little bit puzzled by this.  Since then he said conversation with his primary care physician and he wants to see Dr. DShana Chutewho is a son-in-law to his friend Dr. BLindwood Quaretired sPsychologist, sport and exercise  I know Dr. GLauris Chromanquite well from an IPF standpoint.  And therefore wholeheartedly supported the idea.  In terms of his lung function testing his FVC shows a decline although he is feeling the same.  On miniature walking test he seems a little more tachycardic but he says he feels the same.  He manages this heart rate with his exercise.  His  DLCO with some variability appears stable.  He had a CT angiogram for thoracic aortic aneurysm in September 2019 and this when compared to one year earlier has been reported as stable but the different resolutions.  I offered to do a high-resolution CT chest here but he wants to hold off until he saw Dr. GLauris Chroman  OV 08/23/2018  Subjective:  Patient ID: KWestley Chandler male , DOB: [birth year], age 80y.o. , MRN: [id number], [place] [id number]  08/23/2018 -   Chief Complaint  Patient presents with  . Follow-up    PFT performed today. Pt states he has been doing well since last visit and denies any complaints.   IPF followup"   Diagnosis made towards the end of 2018.  He has been on Esbriet since then.   HPI KNOLE ROBEY744y.o. -Dr.  Ronny Flurry presents for follow-up of his IPF.  He continues to feel good.  His main issue is tolerating the Esbriet which gives some fatigue.  Sometimes he has to postpone it.  In terms of his dyspnea he continues to exercise.  His dyspnea symptom score is really minimal.  His cough is nonexistent.  He was losing weight with Esbriet but now he is gained his weight back.  In the interim he did see Dr. Shana Chute June 23, 2018 at [facility].  He had a satisfactory visit.  He thought he was stable.  His next visit with Dr. Lauris Chroman will be in June 2020.  Moving forward he wants to alternate every 6 months between myself and Dr. Lauris Chroman at [facility] which will give him 3 months with 1 pulmonologist or the other.  He is beginning to get interested in research studies.  We did pulmonary function test on him today and is documented below.  The FVC appears a fluctuant but the DLCO definitely appears on a downward trend.  The new DLCO is on a global lung initiated [GL I] equation and therefore the percent predicted is higher.  But the absolute value showed downward trend line.  We went over this.  He definitely does not feel this subjectively.  His  last high-resolution CT scan of the chest was in August of 2018.  He is willing to have one at follow-up.  He wants to see me again in 6 months.  His simple walking desaturation test appears stable.  He is thinking of making a trip to [place] and wants to know if he should wear a mask.    OV 04/21/2019  Subjective:  Patient ID: Westley Chandler, male , DOB: 1940-11-25 , age 52 y.o. , MRN: [id number] , ADDRESS: [place] [address]   04/21/2019 -   Chief Complaint  Patient presents with  . Follow-up    Patient reports that his breathing is doing well at this time.    IPF followup. On esbriet  HPI ELDO UMANZOR 80 y.o. -presents for follow-up of his idiopathic pulmonary fibrosis.  Last visit was pre-pandemic in February 2018.  In the interim he did see [facility] at [facility] and deemed to be stable.  At this point in time he is reporting continued stability with the symptoms as seen by the score below.  However I did notice that he is lost lot of weight.  His BMI with his clothes on is 25 with a weight of 170 pounds.  He says his dry weight is actually around 162 pounds.  He feels that this is the stable weight in the last 6 months.  He says he is not bothered by it.  He says this is because of low appetite because of the and pirfenidone.  He feels the pirfenidone is benefiting him and he continues to be is to be stable.  In fact his pulmonary function test shows " improvement".  He said that he felt the test today was somewhat variable and is effort based on the technician.  He had a recent high-resolution CT chest that shows continued stability.  I personally visualized this film.  At this point in time he is happy taking the pirfenidone.  But he did agree if there is further weight loss into the 150s pounds then we could reassess  Had a lot of discussion about COVID-19 and about appropriate risk reduction measures which he is continuing to  do.  He did some read some  literature about COVID-19 and had questions about it. He in Vit D supplementation and is agreeable to get his levels checked following literature about protective effect of Vit D  Other than that he reports 2 episodes of dizziness and in one of those he nearly passed out.  He was wondering if some of the symptoms were related to ropinirole and therefore he has stopped it.  I reviewed the literature with him and the significant amount of dizziness and hypotension.  And also GI symptoms which can probably accentuated with pirfenidone.   OV 10/16/2019  Subjective:  Patient ID: Westley Chandler, male , DOB: [birth year] , age 54 y.o. , MRN: [id number] , ADDRESS: [place] [id number]   10/16/2019 -   Chief Complaint  Patient presents with  . Follow-up    PFT performed 3/31.  Pt states he is doing okay. States his breathing is about the same since last visit.     HPI ARLING CERONE 80 y.o.  -follow-up IPF on pirfenidone    -ROS   39-monthfollow-up for Dr. YAnnamaria Boots  He continues on pirfenidone.  He is taking full dose.  In the interim he has had hernia surgery 6 weeks ago for right inguinal hernia.  This is gone well.  He is change his exercise from bicycle to walking because of the hernia.  His symptom score is the same.  [facility] 2021 [facility]  Deemed to be stable.  I reviewed that note.  After that he has had pulmonary function test with u[place]oreacouple weeks ago.  This shows a decline.  He says the quality of instructions was poor and therefore the decline is because of that.  At least that is his suspicion.  He feels well.  Walking desaturation test is stable.  He has gained some weight.  He continues to have some nausea and fatigue and diarrhea with the pirfenidone.  But this is stable and manageable.  He feels his irritable bowel syndrome is active because of the pirfenidone.  He plans to see Dr. JJoanell Risingfor that.  His last liver  function test was with Dr. GLauris Chromanin January 2021.  This was normal per the note.  His last CT scan was towards the end of 2020 and deemed to be stable.     OV 04/11/2020   Subjective:  Patient ID: KWestley Chandler male , DOB: 423-Sep-1942 age 80y.o. years. , MRN: [id number]  ADDRESS[place] [id number]  PCrist Infante MD Providers : Dr GAlisia Ferrari Treatment Team:  Attending Provider: RBrand Males MD   Chief Complaint  Patient presents with  . Follow-up    PFT performed today.  Pt states he has been doing well since last visit and denies any complaints.    Follow-up idiopathic pulmonary fibrosis.  Diagnosis towards the end of 2018.  On pirfenidone.  Last CT scan of the chest September 2020.   HPI KOLIVERIO CHO89y.o. -presents for follow-up last seen in April 2021.  After that in July 2021 [facility]  I reviewed the notes.  The feeling was that he was stable.  He is only minimally symptomatic.  He had a 6-minute walk test and did really well without any significant desaturations.  He is now resorted to high intensity interval  training based on advice from a physical therapist/gym instructor.  He feels like stronger and failure.  He also states that his appetite is better and is eating better.  He feels his weight is stable.  In terms of his IPF symptoms is minimal and documented below.  He just has mild irritable bowel syndrome with his Esbriet and baseline health.  His walking desaturation test today in the office is stable.  His pulmonary function test was reviewed.  Over the last 3 years it shows a slight steady decline.  This decline is both in FVC and DLCO.  He is averaging around 2.6% decline per year with FVC while 8% decline in the DLCO per year.  On an average with a 3% decline per year in the last 3 years.  We did discuss this.  We discussed #pulmonary fibrosis foundation support group -he will visit the  website.  He gets newsletters from them.  At this point in time he is not interested in joining the support group but he did get the contact information for this and will reach out if he desires so.  #-We also discussed research as a care option -we discussed trial sponsored by [place] [facility] of pirfenidone.  The trial is called [employer].  The investigation medical product is called PRM-[id number] in the axilla macrophage pathway.  Promising phase 2 data.  Research team gave him the consent form under my delegation.  He understands research is voluntary and is to evaluate unproven therapies.  He understands that the primary intent is to contribute to her scientific development and therapeutic gain is secondary because of an approved product the risks benefit ratio is very different compared to blood products.  He is going to read the consent and think about potential participation.  I sent him via email medical journal articles about the investigational product    He tells me that he feels his weight is stable.  His dry weight early in the morning is between 162-163 pounds.  I pointed out that his weight today is 168 pounds and is lower than before.  However he says that the difference rating 168-172 is related to clothing and his weight at home is stable.  He is not concerned about the slight weight loss.  Overall while he agrees he is lost weight since starting Esbriet he feels recently has been stable.  He wants to continue to monitor the situation  Irritable bowel: He said he will check with Dr. Henrene Pastor about this  Other issues:  - He wants me to check his iron panel -He will do follow-up of his IPF-registry visit today  -for research-  -He has had his Covid booster      Results for ARTEMUS, ROMANOFF (MRN [id number]) as of 10/16/2019 09:07  Ref. Range 03/04/2017 09:13 06/22/2017 09:14 09/20/2017 08:54 12/07/2017 13:49 03/08/2018 10:15 05/10/2018 13:59 08/23/2018 09:29 04/21/2019 11:06 10/04/2019 09:32  04/11/20 10/21/20  FVC-Pre Latest Units: L 3.59 3.70 3.47 3.54 3.56 3.27 3.19 3.59 3.35 3.26 3.2  FVC-%Pred-Pre Latest Units: % 86 89 84 86 86 84 82 90 83 82% 81%  Results for BRAEDYN, KAUK A (MRN [id number]) as of 10/16/2019 09:07  Ref. Range 03/04/2017 09:13 06/22/2017 09:14 09/20/2017 08:54 12/07/2017 13:49 03/08/2018 10:15 05/10/2018 13:59 08/23/2018 09:29 04/21/2019 11:06 10/04/2019 09:32 04/11/20 10/21/20  DLCO unc Latest Units: ml/min/mmHg 20.12 19.77 24.06 21.20 19.63 19.04 18.61 20.89 16.69 17.26 13.6  DLCO unc % pred Latest Units: % 63 62 76 66 62 64  79 86 69 72% 56%   OV 10/22/2020  Subjective:  Patient ID: Westley Chandler, male , DOB: 09-02-1940 , age 5 y.o. , MRN: [id number] , ADDRESS: [place] [id number] PCP [name], MD Patient Care Team: [name], MD as PCP - General (Internal Medicine) Cira Rue, RN as Oncology Nurse Navigator  This Provider for this visit: Treatment Team:  Attending Provider: Brand Males, MD    10/22/2020 -   Chief Complaint  Patient presents with  . Follow-up    Fatigue from radiation treatments, more winded and more GI upset from radiation treatments    Follow-up idiopathic pulmonary fibrosis.  Diagnosis towards the end of 2018.  On pirfenidone.  Last CT scan of the chest September 2020.    HPI JAXN CHIQUITO 80 y.o. -returns for follow-up.  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  He is finished hormonal treatment and now is on radiation treatment.  Radiation treatment is aggravating his GI side effects from Pirfenidone (Esbriet) and from irritable bowel syndrome.  Is also causing fatigue.  He went August a masters in walk the tournament.  This made him more fatigued than what he experienced few years ago.  He said it took him a few days to recover.  He used to have weight loss but this is now stabilized it is documented below.  His symptom severity score and his walking desaturation test are stable but his pulmonary  function test continues to show decline.  His DLCO shows a relatively more decline but he said there were technique issues today.  He is got upcoming appointment with ILD clinic at [facility] in July.  At this point in time he is interested in phase 3 clinical trials.  We have quite a few to off as of summer 2022 but his prostate cancer could be an exclusion.  We will have the research team evaluate.  He is saddened by the loss of his friend Dr. March Rummage.  He is evaluating whether he should join the pulmonary fibrosis foundation at the national level to be a board member.  SYMPTOM SCALE - ILD 08/23/2018  04/21/2019  10/16/2019  04/11/2020 168# 10/22/2020 170# - esbriet and XRT for prostate  O2 use ra e ra    Shortness of Breath 0 -> 5 scale with 5 being worst (score 6 If unable to do)      At rest 0 0 0 0 0  Simple tasks - showers, clothes change, eating, shaving *0** 0 0 0 0  Household (dishes, doing bed, laundry) 0 0 0 0 0  Shopping 0 0 0 0 0  Walking level at own pace 0 0 0 0 0  Walking up Stairs _0 Total (40 - 48) Dyspnea Score _1 How bad is your cough? 0 0 0 0 0  How bad is your fatigue 1 due to esbriet 0 2 0 2  nausea   1 0 1  vomit   0 0 0  diarrhea   _2 anxiety   0 0 0  depresso0   0 0 0        Simple office walk 185 feet x  3 laps goal with forehead probe 12/07/2017 Weight 180# 03/08/2018 Weight 174.9 05/10/2018 Weight 174# 08/23/2018 Weight 178# 04/21/2019 Weight 170# 10/16/2019 173# 04/11/2020 168# 10/22/2020 170#  O2 used Room air Room air Room air Room air Room  air Room air    Number laps completed _0 Comments about pace Moderate pace Mod pace Mod fast pace fast  fast    Resting Pulse Ox/HR 100% and 83/min 100% ad 85/min 100% and 91/min 100% and 82/min  100% asnd 91 100% and 83/min 98% and 94/min  Final Pulse Ox/HR 99% and 98/min 98$ and 104/min 98% and 108/min 98% and 100/min  96% and 106/mom 98% and 96/mn 95% and 110.min  Desaturated  </= 88% no no no no  no    Desaturated <= 3% points no no ni no  yesm 4 points  Yes, 3 points  Got Tachycardic >/= 90/min yes yes yes yes  yes  yes  Symptoms at end of test No complaints No complaints  No complaints  Mild dyspnea  no  Miscellaneous comments x ? sligh increase in tachy/stable ? incraeased tachty        PFT  PFT Results Latest Ref Rng & Units 10/21/2020 04/11/2020 10/04/2019 04/21/2019 08/23/2018 05/10/2018 03/08/2018  FVC-Pre L 3.20 3.26 3.35 3.59 3.19 3.27 3.56  FVC-Predicted Pre % 81 82 83 90 82 84 86  Pre FEV1/FVC % % 81 80 82 81 83 82 81  FEV1-Pre L 2.59 2.62 2.74 2.92 2.64 2.67 2.88  FEV1-Predicted Pre % 91 92 95 102 94 96 97  DLCO uncorrected ml/min/mmHg 13.64 17.26 16.69 20.89 18.61 19.04 19.63  DLCO UNC% % 56 72 69 86 79 64 62  DLCO corrected ml/min/mmHg 13.64 17.26 17.31 - - - -  DLCO COR %Predicted % 56 72 71 - - - -  DLVA Predicted % 80 94 91 109 100 88 86       has a past medical history of Arthritis, Diverticulosis of colon (without mention of hemorrhage), GERD (gastroesophageal reflux disease), Hiatal hernia, HLD (hyperlipidemia), Internal hemorrhoids without mention of complication, Irritable bowel syndrome, Other specified gastritis without mention of hemorrhage, Prostate cancer ([place]), and Pulmonary fibrosis ([place]).   reports that he has never smoked. He has never used smokeless tobacco.  Past Surgical History:  Procedure Laterality Date  . CARPAL TUNNEL RELEASE     left  . CATARACT EXTRACTION  06/2011   bilateral  . INGUINAL HERNIA REPAIR Right 09/05/2019   Procedure: OPEN REPAIR RIGHT INGUINAL HERNIA WITH MESH;  Surgeon: Armandina Gemma, MD;  Location: [facility];  Service: General;  Laterality: Right;  . [place]   x 2  . LUMBAR LAMINECTOMY/DECOMPRESSION MICRODISCECTOMY Left 07/11/2013   Procedure: LUMBAR ONE TO TWO, LUMBAR TWO TO THREE, LUMBAR THREE TO FOUR LUMBAR LAMINECTOMY/DECOMPRESSION MICRODISCECTOMY 3 LEVELS;  Surgeon: Charlie Pitter,  MD;  Location: [place] [facility];  Service: Neurosurgery;  Laterality: Left;  . NASAL SINUS SURGERY      Allergies  Allergen Reactions  . Ezetimibe Other (See Comments)  . Omeprazole Other (See Comments)  . Other Other (See Comments)    Immunization History  Administered Date(s) Administered  . Fluad Quad(high Dose 65+) 03/18/2020  . Influenza Split 10/03/2009, 03/28/2010, 04/14/2011, 03/22/2012, 03/09/2013, 03/28/2014  . Influenza, High Dose Seasonal PF 03/19/2016, 03/06/2017, 04/09/2018, 03/07/2019, 04/06/2020  . Influenza, Quadrivalent, Recombinant, Inj, Pf 03/12/2018, 03/10/2019, 04/06/2020  . Influenza,inj,Quad PF,6+ Mos 04/04/2014, 03/26/2015, 04/05/2016  . Influenza-Unspecified 04/05/2016  . PFIZER(Purple Top)SARS-COV-2 Vaccination 07/20/2019, 08/10/2019, 02/04/2020, 02/27/2020, 10/15/2020  . Pneumococcal Conjugate-13 03/30/2013, 07/31/2013, 04/26/2018  . Pneumococcal Polysaccharide-23 10/03/2009, 01/14/2010, 03/31/2018  . Pneumococcal-Unspecified 04/05/2016  . Td 10/20/2016  . Tdap 10/03/2009  .  Zoster 10/03/2009  . Zoster Recombinat (Shingrix) 01/03/2017    Family History  Problem Relation Age of Onset  . Heart failure Father 97  . Bladder Cancer Mother   . Breast cancer Mother   . Colon cancer Neg Hx   . Pancreatic cancer Neg Hx   . Rectal cancer Neg Hx   . Stomach cancer Neg Hx   . Prostate cancer Neg Hx      Current Outpatient Medications:  .  acetaminophen-codeine (TYLENOL #3) 300-30 MG per tablet, Take 1 tablet by mouth every 8 (eight) hours as needed for moderate pain., Disp: , Rfl:  .  aspirin EC 81 MG tablet, Take 81 mg by mouth 3 (three) times a week., Disp: , Rfl:  .  b complex vitamins capsule, Take 1 tablet by mouth daily., Disp: , Rfl:  .  carisoprodol (SOMA) 350 MG tablet, Take 175 mg by mouth 3 (three) times daily as needed for muscle spasms., Disp: , Rfl:  .  Cholecalciferol (VITAMIN D-3) 1000 UNITS CAPS, Take 1,000 Units by mouth every evening. ,  Disp: , Rfl:  .  CO-ENZYME Q-10 PO, Take 1 tablet by mouth every evening. , Disp: , Rfl:  .  diclofenac (VOLTAREN) 75 MG EC tablet, Take 75 mg by mouth 2 (two) times daily as needed (pain.)., Disp: , Rfl: 2 .  diclofenac sodium (VOLTAREN) 1 % GEL, Apply 4 g topically 4 (four) times daily as needed (pain.)., Disp: , Rfl: 12 .  dicyclomine (BENTYL) 10 MG capsule, Take one by mouth every 6 hours as needed for abdominal cramping (Patient taking differently: Take 10 mg by mouth every 6 (six) hours as needed (abdominal spasms.).), Disp: 30 capsule, Rfl: 1 .  diphenoxylate-atropine (LOMOTIL) 2.5-0.025 MG tablet, Take 1 tablet by mouth 4 (four) times daily as needed for diarrhea or loose stools., Disp: , Rfl:  .  ESBRIET 801 MG TABS, Take 1 tablet by mouth 3 (three) times daily with meals., Disp: 90 tablet, Rfl: 11 .  esomeprazole (NEXIUM) 40 MG capsule, Take 40 mg by mouth every evening. , Disp: , Rfl:  .  ezetimibe (ZETIA) 10 MG tablet, Take 10 mg by mouth daily., Disp: , Rfl:  .  fluticasone (FLONASE) 50 MCG/ACT nasal spray, Place 2 sprays into both nostrils daily., Disp: , Rfl:  .  Glucosamine Sulfate 500 MG TABS, Take 500 mg by mouth daily., Disp: , Rfl:  .  HYDROcodone-acetaminophen (NORCO/VICODIN) 5-325 MG tablet, Take 1-2 tablets by mouth every 4 (four) hours as needed for moderate pain., Disp: 24 tablet, Rfl: 0 .  loratadine (CLARITIN) 10 MG tablet, Take 10 mg by mouth daily., Disp: , Rfl:  .  meclizine (ANTIVERT) 25 MG tablet, Take 25 mg by mouth 3 (three) times daily as needed for dizziness. , Disp: , Rfl:  .  methocarbamol (ROBAXIN) 500 MG tablet, take 1 tab 4 times daily prn spasm, Disp: , Rfl:  .  mirtazapine (REMERON) 15 MG tablet, 1 TABLET AT BEDTIME ORALLY ONCE A DAY AT BEDTIME 30 DAY(S), Disp: , Rfl:  .  ondansetron (ZOFRAN-ODT) 8 MG disintegrating tablet, Take 8 mg by mouth every 6 (six) hours as needed for nausea/vomiting., Disp: , Rfl:  .  oxybutynin (DITROPAN) 5 MG tablet, Take 5 mg by  mouth every 8 (eight) hours as needed., Disp: , Rfl:  .  sildenafil (REVATIO) 20 MG tablet, take 4 or 5 tabs 30 minutes to 4 hrs prior to sexual activity, Disp: , Rfl:  Objective:   Vitals:   10/22/20 1114  BP: 118/74  Pulse: 94  Temp: (!) 97.4 F (36.3 C)  TempSrc: Oral  SpO2: 98%  Weight: 170 lb 1.6 oz (77.2 kg)  Height: _0  (1.753 m)    Estimated body mass index is 25.12 kg/m as calculated from the following:   Height as of this encounter: _1  (1.753 m).   Weight as of this encounter: 170 lb 1.6 oz (77.2 kg).  _2 @  Filed Weights   10/22/20 1114  Weight: 170 lb 1.6 oz (77.2 kg)     Physical Exam   General: No distress. Look swel Neuro: Alert and Oriented x 3. GCS 15. Speech normal Psych: Pleasant Resp:  Barrel Chest - no.  Wheeze - no, Crackles - yes, No overt respiratory distress CVS: Normal heart sounds. Murmurs - no Ext: Stigmata of Connective Tissue Disease - no HEENT: Normal upper airway. PEERL +. No post nasal drip        Assessment:       ICD-10-CM   1. ILD (interstitial lung disease) ([place])  J84.9 Pulmonary function test    Hepatic function panel    Hepatic function panel       Plan:     Patient Instructions  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[place])  -slowy progressive disease - some of fatigue and IBS can be from radiation from prostate  Plan  - continue esbriet as dsicussed =- check LFT 10/22/2020 - keep [facility] clinic appt in July 2022  - spiro and dlco in Aug/sept 2022 with [place] - continue fitness with o2 monitoring - will refer you to [facility] for interventional trials   - will pre-screen based on Prostate cancer exclusion   Followup  - 4-5 months ILD clinic 30 min slot but after spiro/dlco     ( Level 05 visit: Estb 40-54 min  in  visit type: on-site physical face to visit  in total care time and counseling or/and coordination of care by this undersigned MD - Dr Brand Males. This includes one or more  of the following on this same day 10/22/2020: pre-charting, chart review, note writing, documentation discussion of test results, diagnostic or treatment recommendations, prognosis, risks and benefits of management options, instructions, education, compliance or risk-factor reduction. It excludes time spent by the [place] or office staff in the care of the patient. Actual time 39 min)   SIGNATURE    Dr. Brand Males, M.D., F.C.C.P,  Pulmonary and Critical Care Medicine Staff Physician, [place] [facility] Director - Interstitial Lung Disease  Program  Pulmonary [place] [facility] [place], [place], [address]  Pager: [phone], If no answer or between  15:00h - 7:00h: call [phone] Telephone: [phone]  1:52 PM 10/22/2020

## 2020-10-23 ENCOUNTER — Ambulatory Visit
Admission: RE | Admit: 2020-10-23 | Discharge: 2020-10-23 | Disposition: A | Discharge status: Home / Self Care | Payer: PPO | Source: Ambulatory Visit | Attending: Radiation Oncology | Admitting: Radiation Oncology

## 2020-10-23 ENCOUNTER — Other Ambulatory Visit: Payer: Self-pay

## 2020-10-23 DIAGNOSIS — C61 Malignant neoplasm of prostate: Secondary | ICD-10-CM | POA: Diagnosis not present

## 2020-10-23 DIAGNOSIS — Z510 Encounter for antineoplastic radiation therapy: Secondary | ICD-10-CM | POA: Diagnosis not present

## 2020-10-23 NOTE — Progress Notes (Signed)
Lab             10/22/20                      1159         AST          21           ALT          21           ALKPHOS      58           BILITOT      0.4          PROT         6.2          ALBUMIN      3.6           LFT normal

## 2020-10-24 ENCOUNTER — Ambulatory Visit
Admission: RE | Admit: 2020-10-24 | Discharge: 2020-10-24 | Disposition: A | Discharge status: Home / Self Care | Payer: PPO | Source: Ambulatory Visit | Attending: Radiation Oncology | Admitting: Radiation Oncology

## 2020-10-24 DIAGNOSIS — Z510 Encounter for antineoplastic radiation therapy: Secondary | ICD-10-CM | POA: Diagnosis not present

## 2020-10-24 DIAGNOSIS — C61 Malignant neoplasm of prostate: Secondary | ICD-10-CM | POA: Diagnosis not present

## 2020-10-25 ENCOUNTER — Ambulatory Visit
Admission: RE | Admit: 2020-10-25 | Discharge: 2020-10-25 | Disposition: A | Discharge status: Home / Self Care | Payer: PPO | Source: Ambulatory Visit | Attending: Radiation Oncology | Admitting: Radiation Oncology

## 2020-10-25 ENCOUNTER — Other Ambulatory Visit: Payer: Self-pay

## 2020-10-25 DIAGNOSIS — Z510 Encounter for antineoplastic radiation therapy: Secondary | ICD-10-CM | POA: Diagnosis not present

## 2020-10-25 DIAGNOSIS — C61 Malignant neoplasm of prostate: Secondary | ICD-10-CM | POA: Diagnosis not present

## 2020-10-28 ENCOUNTER — Other Ambulatory Visit: Payer: Self-pay

## 2020-10-28 ENCOUNTER — Ambulatory Visit
Admission: RE | Admit: 2020-10-28 | Discharge: 2020-10-28 | Disposition: A | Discharge status: Home / Self Care | Payer: PPO | Source: Ambulatory Visit | Attending: Radiation Oncology | Admitting: Radiation Oncology

## 2020-10-28 DIAGNOSIS — C61 Malignant neoplasm of prostate: Secondary | ICD-10-CM | POA: Diagnosis not present

## 2020-10-28 DIAGNOSIS — Z510 Encounter for antineoplastic radiation therapy: Secondary | ICD-10-CM | POA: Diagnosis not present

## 2020-10-29 ENCOUNTER — Ambulatory Visit
Admission: RE | Admit: 2020-10-29 | Discharge: 2020-10-29 | Disposition: A | Discharge status: Home / Self Care | Payer: PPO | Source: Ambulatory Visit | Attending: Radiation Oncology | Admitting: Radiation Oncology

## 2020-10-29 DIAGNOSIS — Z510 Encounter for antineoplastic radiation therapy: Secondary | ICD-10-CM | POA: Diagnosis not present

## 2020-10-29 DIAGNOSIS — C61 Malignant neoplasm of prostate: Secondary | ICD-10-CM | POA: Diagnosis not present

## 2020-10-30 ENCOUNTER — Ambulatory Visit
Admission: RE | Admit: 2020-10-30 | Discharge: 2020-10-30 | Disposition: A | Discharge status: Home / Self Care | Payer: PPO | Source: Ambulatory Visit | Attending: Radiation Oncology | Admitting: Radiation Oncology

## 2020-10-30 ENCOUNTER — Other Ambulatory Visit: Payer: Self-pay

## 2020-10-30 DIAGNOSIS — Z510 Encounter for antineoplastic radiation therapy: Secondary | ICD-10-CM | POA: Diagnosis not present

## 2020-10-30 DIAGNOSIS — C61 Malignant neoplasm of prostate: Secondary | ICD-10-CM | POA: Diagnosis not present

## 2020-10-31 ENCOUNTER — Ambulatory Visit
Admission: RE | Admit: 2020-10-31 | Discharge: 2020-10-31 | Disposition: A | Discharge status: Home / Self Care | Payer: PPO | Source: Ambulatory Visit | Attending: Radiation Oncology | Admitting: Radiation Oncology

## 2020-10-31 DIAGNOSIS — Z510 Encounter for antineoplastic radiation therapy: Secondary | ICD-10-CM | POA: Diagnosis not present

## 2020-10-31 DIAGNOSIS — C61 Malignant neoplasm of prostate: Secondary | ICD-10-CM | POA: Diagnosis not present

## 2020-11-01 ENCOUNTER — Other Ambulatory Visit: Payer: Self-pay

## 2020-11-01 ENCOUNTER — Ambulatory Visit
Admission: RE | Admit: 2020-11-01 | Discharge: 2020-11-01 | Disposition: A | Discharge status: Home / Self Care | Payer: PPO | Source: Ambulatory Visit | Attending: Radiation Oncology | Admitting: Radiation Oncology

## 2020-11-01 DIAGNOSIS — Z510 Encounter for antineoplastic radiation therapy: Secondary | ICD-10-CM | POA: Diagnosis not present

## 2020-11-01 DIAGNOSIS — C61 Malignant neoplasm of prostate: Secondary | ICD-10-CM | POA: Diagnosis not present

## 2020-11-04 ENCOUNTER — Ambulatory Visit
Admission: RE | Admit: 2020-11-04 | Discharge: 2020-11-04 | Disposition: A | Discharge status: Home / Self Care | Payer: PPO | Source: Ambulatory Visit | Attending: Radiation Oncology | Admitting: Radiation Oncology

## 2020-11-04 ENCOUNTER — Other Ambulatory Visit: Payer: Self-pay

## 2020-11-04 DIAGNOSIS — C61 Malignant neoplasm of prostate: Secondary | ICD-10-CM | POA: Diagnosis not present

## 2020-11-04 DIAGNOSIS — Z510 Encounter for antineoplastic radiation therapy: Secondary | ICD-10-CM | POA: Diagnosis not present

## 2020-11-05 ENCOUNTER — Ambulatory Visit
Admission: RE | Admit: 2020-11-05 | Discharge: 2020-11-05 | Disposition: A | Discharge status: Home / Self Care | Payer: PPO | Source: Ambulatory Visit | Attending: Radiation Oncology | Admitting: Radiation Oncology

## 2020-11-05 DIAGNOSIS — C61 Malignant neoplasm of prostate: Secondary | ICD-10-CM | POA: Diagnosis not present

## 2020-11-05 DIAGNOSIS — Z510 Encounter for antineoplastic radiation therapy: Secondary | ICD-10-CM | POA: Diagnosis not present

## 2020-11-06 ENCOUNTER — Ambulatory Visit
Admission: RE | Admit: 2020-11-06 | Discharge: 2020-11-06 | Disposition: A | Discharge status: Home / Self Care | Payer: PPO | Source: Ambulatory Visit | Attending: Radiation Oncology | Admitting: Radiation Oncology

## 2020-11-06 ENCOUNTER — Other Ambulatory Visit: Payer: Self-pay

## 2020-11-06 ENCOUNTER — Ambulatory Visit: Payer: PPO

## 2020-11-06 DIAGNOSIS — Z510 Encounter for antineoplastic radiation therapy: Secondary | ICD-10-CM | POA: Diagnosis not present

## 2020-11-06 DIAGNOSIS — C61 Malignant neoplasm of prostate: Secondary | ICD-10-CM | POA: Diagnosis not present

## 2020-11-07 ENCOUNTER — Encounter: Payer: Self-pay | Admitting: Urology

## 2020-11-07 ENCOUNTER — Ambulatory Visit
Admission: RE | Admit: 2020-11-07 | Discharge: 2020-11-07 | Disposition: A | Discharge status: Home / Self Care | Payer: PPO | Source: Ambulatory Visit | Attending: Radiation Oncology | Admitting: Radiation Oncology

## 2020-11-07 DIAGNOSIS — Z510 Encounter for antineoplastic radiation therapy: Secondary | ICD-10-CM | POA: Diagnosis not present

## 2020-11-07 DIAGNOSIS — C61 Malignant neoplasm of prostate: Secondary | ICD-10-CM | POA: Diagnosis not present

## 2020-11-20 ENCOUNTER — Ambulatory Visit: Payer: PPO | Admitting: Internal Medicine

## 2020-11-21 DIAGNOSIS — C44319 Basal cell carcinoma of skin of other parts of face: Secondary | ICD-10-CM | POA: Diagnosis not present

## 2020-11-21 DIAGNOSIS — M65332 Trigger finger, left middle finger: Secondary | ICD-10-CM | POA: Diagnosis not present

## 2020-11-21 DIAGNOSIS — L821 Other seborrheic keratosis: Secondary | ICD-10-CM | POA: Diagnosis not present

## 2020-11-21 DIAGNOSIS — Z85828 Personal history of other malignant neoplasm of skin: Secondary | ICD-10-CM | POA: Diagnosis not present

## 2020-11-21 DIAGNOSIS — M65331 Trigger finger, right middle finger: Secondary | ICD-10-CM | POA: Diagnosis not present

## 2020-12-12 NOTE — Progress Notes (Signed)
Patient is aware that this is a phone visit. Meaningful use is complete. Patient Ipps was  a 15. Patient states that he has an appointment with his urologist later this month.Patient reports moderate to serve fatigue.. Patient denies any dysuria or hematuria. Patient reports nocturia x3-4.Patient denies any leakage. Patient reports some urgency.Patient states that he does not empty his bladder with urination Patient stats that his stream is weak at night and strong during the day. Patient states that he has to push and strain to start his stream . Patient states that he stop and go when voiding.Patient states that he has constipation and diarrhea,but states that he  takes medication for relief.

## 2020-12-13 DIAGNOSIS — M4726 Other spondylosis with radiculopathy, lumbar region: Secondary | ICD-10-CM | POA: Diagnosis not present

## 2020-12-16 DIAGNOSIS — M4726 Other spondylosis with radiculopathy, lumbar region: Secondary | ICD-10-CM | POA: Diagnosis not present

## 2020-12-18 ENCOUNTER — Other Ambulatory Visit: Payer: Self-pay

## 2020-12-18 ENCOUNTER — Ambulatory Visit
Admission: RE | Admit: 2020-12-18 | Discharge: 2020-12-18 | Disposition: A | Discharge status: Home / Self Care | Payer: PPO | Source: Ambulatory Visit | Attending: Urology | Admitting: Urology

## 2020-12-18 DIAGNOSIS — C61 Malignant neoplasm of prostate: Secondary | ICD-10-CM

## 2020-12-18 DIAGNOSIS — M4726 Other spondylosis with radiculopathy, lumbar region: Secondary | ICD-10-CM | POA: Diagnosis not present

## 2020-12-18 NOTE — Progress Notes (Signed)
  Radiation Oncology         [phone] ________________________________  Name: Kenneth [name] MRN: [id number]  Date: 11/07/2020  DOB: 12-11-1940  End of Treatment Note  Diagnosis:   80 y.o. 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     Indication for treatment:  Curative, Definitive Radiotherapy       Radiation treatment dates:   09/12/20 - 11/07/20  Site/dose:  1. The prostate, seminal vesicles, and pelvic lymph nodes were initially treated to 45 Gy in 25 fractions of 1.8 Gy  2. The prostate only was boosted to 75 Gy with 15 additional fractions of 2.0 Gy   Beams/energy:  1. The prostate, seminal vesicles, and pelvic lymph nodes were initially treated using VMAT intensity modulated radiotherapy delivering 6 megavolt photons. Image guidance was performed with CB-CT studies prior to each fraction. He was immobilized with a body fix lower extremity mold.  2. the prostate only was boosted using VMAT intensity modulated radiotherapy delivering 6 megavolt photons. Image guidance was performed with CB-CT studies prior to each fraction. He was immobilized with a body fix lower extremity mold.  Narrative: The patient tolerated radiation treatment relatively well with only minor urinary irritation and modest fatigue.  He did experience increased urgency, hesitancy and nocturia every hour despite taking Ditropan.  He specifically denied any dysuria, gross hematuria or incontinence.  He did report diarrhea which was improved with Imodium as needed.   Plan: The patient has completed radiation treatment. He will return to radiation oncology clinic for routine followup in one month. I advised him to call or return sooner if he has any questions or concerns related to his recovery or treatment. ________________________________  Sheral Apley. Tammi Klippel, M.D.

## 2020-12-18 NOTE — Progress Notes (Signed)
Radiation Oncology         (336) [phone] ________________________________  Name: Kenneth [name] MRN: [id number]  Date: 12/18/2020  DOB: 20-Oct-1940  Post Treatment Note  CC: [name], MD  [name], MD  Diagnosis:   80 y.o. 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  Interval Since Last Radiation:  5.5 weeks, concurrent with LT-ADT started 6 month Eligard 07/2020) 09/12/20 - 11/07/20: 1. The prostate, seminal vesicles, and pelvic lymph nodes were initially treated to 45 Gy in 25 fractions of 1.8 Gy 2. The prostate only was boosted to 75 Gy with 15 additional fractions of 2.0 Gy  Narrative:  The patient returns today for routine follow-up.  He tolerated radiation treatment relatively well with only minor urinary irritation and modest fatigue.  He did experience increased urgency, hesitancy and nocturia every hour despite taking Ditropan.  He specifically denied any dysuria, gross hematuria or incontinence.  He did report diarrhea which was improved with Imodium as needed.                              On review of systems, the patient states that he is doing well in general.  His main complaint at this point is the persistent fatigue but otherwise, he feels that his LUTS are gradually improving with a current IPSS score of 15, indicating moderate urinary symptoms.  His nocturia has improved to 3-4 times per night and he continues with a weaker flow of stream in the evenings but relatively strong stream throughout the day.  He also continues with some hesitancy and intermittency, particularly in the evenings but specifically denies dysuria, gross hematuria or incontinence.  He feels that he empties his bladder well on average. His bowels are gradually improving though he still will occasionally need his Bentyl/Lomotil for GI upset. He continues to tolerate the ADT well aside from fatigue and occasional hot flashes and has resumed daily exercise  with a PT/trainer over the past 2 weeks and has noted improved energy/stamina.  Overall, he is pleased with his progress to date.  ALLERGIES:  is allergic to ezetimibe, omeprazole, and other.  Meds: Current Outpatient Medications  Medication Sig Dispense Refill   acetaminophen-codeine (TYLENOL #3) 300-30 MG per tablet Take 1 tablet by mouth every 8 (eight) hours as needed for moderate pain.     aspirin EC 81 MG tablet Take 81 mg by mouth 3 (three) times a week.     b complex vitamins capsule Take 1 tablet by mouth daily.     carisoprodol (SOMA) 350 MG tablet Take 175 mg by mouth 3 (three) times daily as needed for muscle spasms.     Cholecalciferol (VITAMIN D-3) 1000 UNITS CAPS Take 1,000 Units by mouth every evening.      CO-ENZYME Q-10 PO Take 1 tablet by mouth every evening.      diclofenac (VOLTAREN) 75 MG EC tablet Take 75 mg by mouth 2 (two) times daily as needed (pain.).  2   diclofenac sodium (VOLTAREN) 1 % GEL Apply 4 g topically 4 (four) times daily as needed (pain.).  12   dicyclomine (BENTYL) 10 MG capsule Take one by mouth every 6 hours as needed for abdominal cramping (Patient taking differently: Take 10 mg by mouth every 6 (six) hours as needed (abdominal spasms.).) 30 capsule 1   diphenoxylate-atropine (LOMOTIL) 2.5-0.025 MG tablet Take 1 tablet by  mouth 4 (four) times daily as needed for diarrhea or loose stools.     ESBRIET 801 MG TABS Take 1 tablet by mouth 3 (three) times daily with meals. 90 tablet 11   esomeprazole (NEXIUM) 40 MG capsule Take 40 mg by mouth every evening.      ezetimibe (ZETIA) 10 MG tablet Take 10 mg by mouth daily.     fluticasone (FLONASE) 50 MCG/ACT nasal spray Place 2 sprays into both nostrils daily.     Glucosamine Sulfate 500 MG TABS Take 500 mg by mouth daily.     HYDROcodone-acetaminophen (NORCO/VICODIN) 5-325 MG tablet Take 1-2 tablets by mouth every 4 (four) hours as needed for moderate pain. 24 tablet 0   loratadine (CLARITIN) 10 MG tablet  Take 10 mg by mouth daily.     meclizine (ANTIVERT) 25 MG tablet Take 25 mg by mouth 3 (three) times daily as needed for dizziness.      methocarbamol (ROBAXIN) 500 MG tablet take 1 tab 4 times daily prn spasm     mirtazapine (REMERON) 15 MG tablet 1 TABLET AT BEDTIME ORALLY ONCE A DAY AT BEDTIME 30 DAY(S)     ondansetron (ZOFRAN-ODT) 8 MG disintegrating tablet Take 8 mg by mouth every 6 (six) hours as needed for nausea/vomiting.     oxybutynin (DITROPAN) 5 MG tablet Take 5 mg by mouth every 8 (eight) hours as needed.     sildenafil (REVATIO) 20 MG tablet take 4 or 5 tabs 30 minutes to 4 hrs prior to sexual activity     No current facility-administered medications for this encounter.    Physical Findings:  vitals were not taken for this visit.   /Unable to assess due to telephone follow-up visit format.  Lab Findings: Lab Results  Component Value Date   WBC 4.1 09/01/2019   HGB 13.4 09/01/2019   HCT 37.9 (L) 09/01/2019   MCV 93.8 09/01/2019   PLT 145 (L) 09/01/2019     Radiographic Findings: No results found.  Impression/Plan: 1. 80 y.o. 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 He will continue to follow up with urology for ongoing PSA determinations and has an appointment scheduled with Dr. Tresa Moore 01/07/21 when he will be due for his next Eligard injection.  He anticipates completing the full 2-year course of ADT under the care and direction of Dr. Tresa Moore.  He understands what to expect with regards to PSA monitoring going forward. I will look forward to following his response to treatment via correspondence with urology, and would be happy to continue to participate in his care if clinically indicated. I talked to the patient about what to expect in the future, including his risk for erectile dysfunction and rectal bleeding. I encouraged him to call or return to the office if he has any questions regarding his previous radiation or  possible radiation side effects. He was comfortable with this plan and will follow up as needed.     Nicholos Johns, PA-C

## 2020-12-20 DIAGNOSIS — M4726 Other spondylosis with radiculopathy, lumbar region: Secondary | ICD-10-CM | POA: Diagnosis not present

## 2020-12-23 DIAGNOSIS — M4726 Other spondylosis with radiculopathy, lumbar region: Secondary | ICD-10-CM | POA: Diagnosis not present

## 2020-12-31 DIAGNOSIS — M4726 Other spondylosis with radiculopathy, lumbar region: Secondary | ICD-10-CM | POA: Diagnosis not present

## 2020-12-31 DIAGNOSIS — C61 Malignant neoplasm of prostate: Secondary | ICD-10-CM | POA: Diagnosis not present

## 2021-01-07 DIAGNOSIS — M4726 Other spondylosis with radiculopathy, lumbar region: Secondary | ICD-10-CM | POA: Diagnosis not present

## 2021-01-07 DIAGNOSIS — C61 Malignant neoplasm of prostate: Secondary | ICD-10-CM | POA: Diagnosis not present

## 2021-01-07 DIAGNOSIS — R3915 Urgency of urination: Secondary | ICD-10-CM | POA: Diagnosis not present

## 2021-01-10 DIAGNOSIS — M4726 Other spondylosis with radiculopathy, lumbar region: Secondary | ICD-10-CM | POA: Diagnosis not present

## 2021-01-20 DIAGNOSIS — M4726 Other spondylosis with radiculopathy, lumbar region: Secondary | ICD-10-CM | POA: Diagnosis not present

## 2021-01-24 DIAGNOSIS — M4726 Other spondylosis with radiculopathy, lumbar region: Secondary | ICD-10-CM | POA: Diagnosis not present

## 2021-01-27 DIAGNOSIS — K449 Diaphragmatic hernia without obstruction or gangrene: Secondary | ICD-10-CM | POA: Diagnosis not present

## 2021-01-27 DIAGNOSIS — K589 Irritable bowel syndrome without diarrhea: Secondary | ICD-10-CM | POA: Diagnosis not present

## 2021-01-27 DIAGNOSIS — Z79899 Other long term (current) drug therapy: Secondary | ICD-10-CM | POA: Diagnosis not present

## 2021-01-27 DIAGNOSIS — J84112 Idiopathic pulmonary fibrosis: Secondary | ICD-10-CM | POA: Diagnosis not present

## 2021-01-27 DIAGNOSIS — K219 Gastro-esophageal reflux disease without esophagitis: Secondary | ICD-10-CM | POA: Diagnosis not present

## 2021-01-27 DIAGNOSIS — Z7951 Long term (current) use of inhaled steroids: Secondary | ICD-10-CM | POA: Diagnosis not present

## 2021-01-27 DIAGNOSIS — J849 Interstitial pulmonary disease, unspecified: Secondary | ICD-10-CM | POA: Diagnosis not present

## 2021-02-03 DIAGNOSIS — M8430XA Stress fracture, unspecified site, initial encounter for fracture: Secondary | ICD-10-CM

## 2021-02-03 HISTORY — DX: Stress fracture, unspecified site, initial encounter for fracture: M84.30XA

## 2021-02-04 ENCOUNTER — Other Ambulatory Visit: Payer: Self-pay | Admitting: Student

## 2021-02-04 DIAGNOSIS — M533 Sacrococcygeal disorders, not elsewhere classified: Secondary | ICD-10-CM

## 2021-02-06 ENCOUNTER — Ambulatory Visit
Admission: RE | Admit: 2021-02-06 | Discharge: 2021-02-06 | Disposition: A | Discharge status: Home / Self Care | Payer: PPO | Source: Ambulatory Visit | Attending: Student | Admitting: Student

## 2021-02-06 ENCOUNTER — Other Ambulatory Visit: Payer: Self-pay | Admitting: Student

## 2021-02-06 ENCOUNTER — Other Ambulatory Visit: Payer: Self-pay

## 2021-02-06 DIAGNOSIS — G8929 Other chronic pain: Secondary | ICD-10-CM

## 2021-02-06 DIAGNOSIS — M533 Sacrococcygeal disorders, not elsewhere classified: Secondary | ICD-10-CM

## 2021-02-06 DIAGNOSIS — M545 Low back pain, unspecified: Secondary | ICD-10-CM | POA: Diagnosis not present

## 2021-02-06 MED ORDER — METHYLPREDNISOLONE ACETATE 40 MG/ML INJ SUSP (RADIOLOG
120.0000 mg | Freq: Once | INTRAMUSCULAR | Status: AC
  Administered 2021-02-06: 120 mg via INTRA_ARTICULAR

## 2021-02-06 MED ORDER — IOPAMIDOL (ISOVUE-M 200) INJECTION 41%
1.0000 mL | Freq: Once | INTRAMUSCULAR | Status: AC
  Administered 2021-02-06: 1 mL via INTRA_ARTICULAR

## 2021-02-06 NOTE — Discharge Instructions (Signed)

## 2021-02-06 NOTE — Discharge Instructions (Addendum)
Joint Injection Discharge Instructions  1. After your joint injection, use ice to the affected area for the next 24 hours as a temporary increase in pain is not uncommon for a day or two after your procedure.  2. Resume all medications unless otherwise instructed.  3. Common side effects of steroids include facial flushing or redness, restlessness or inability to sleep and an increase in your blood sugar if you are a diabetic.    4. Follow up with the ordering physician for post care.  5. If you have any of the following please call [phone]:      Temperature greater than 101     Pain, redness or swelling at the injection site

## 2021-02-07 DIAGNOSIS — Z125 Encounter for screening for malignant neoplasm of prostate: Secondary | ICD-10-CM | POA: Diagnosis not present

## 2021-02-07 DIAGNOSIS — R82998 Other abnormal findings in urine: Secondary | ICD-10-CM | POA: Diagnosis not present

## 2021-02-07 DIAGNOSIS — E785 Hyperlipidemia, unspecified: Secondary | ICD-10-CM | POA: Diagnosis not present

## 2021-02-07 DIAGNOSIS — K921 Melena: Secondary | ICD-10-CM | POA: Diagnosis not present

## 2021-02-07 DIAGNOSIS — R7301 Impaired fasting glucose: Secondary | ICD-10-CM | POA: Diagnosis not present

## 2021-02-18 DIAGNOSIS — D649 Anemia, unspecified: Secondary | ICD-10-CM | POA: Diagnosis not present

## 2021-02-18 DIAGNOSIS — R011 Cardiac murmur, unspecified: Secondary | ICD-10-CM | POA: Diagnosis not present

## 2021-02-18 DIAGNOSIS — I712 Thoracic aortic aneurysm, without rupture: Secondary | ICD-10-CM | POA: Diagnosis not present

## 2021-02-18 DIAGNOSIS — M8580 Other specified disorders of bone density and structure, unspecified site: Secondary | ICD-10-CM | POA: Diagnosis not present

## 2021-02-18 DIAGNOSIS — Z0000 Encounter for general adult medical examination without abnormal findings: Secondary | ICD-10-CM | POA: Diagnosis not present

## 2021-02-18 DIAGNOSIS — M4726 Other spondylosis with radiculopathy, lumbar region: Secondary | ICD-10-CM | POA: Diagnosis not present

## 2021-02-18 DIAGNOSIS — R0609 Other forms of dyspnea: Secondary | ICD-10-CM | POA: Diagnosis not present

## 2021-02-18 DIAGNOSIS — K921 Melena: Secondary | ICD-10-CM | POA: Diagnosis not present

## 2021-02-18 DIAGNOSIS — I2510 Atherosclerotic heart disease of native coronary artery without angina pectoris: Secondary | ICD-10-CM | POA: Diagnosis not present

## 2021-02-18 DIAGNOSIS — Z23 Encounter for immunization: Secondary | ICD-10-CM | POA: Diagnosis not present

## 2021-02-18 DIAGNOSIS — Z1331 Encounter for screening for depression: Secondary | ICD-10-CM | POA: Diagnosis not present

## 2021-02-18 DIAGNOSIS — C61 Malignant neoplasm of prostate: Secondary | ICD-10-CM | POA: Diagnosis not present

## 2021-02-18 DIAGNOSIS — I272 Pulmonary hypertension, unspecified: Secondary | ICD-10-CM | POA: Diagnosis not present

## 2021-02-18 DIAGNOSIS — R972 Elevated prostate specific antigen [PSA]: Secondary | ICD-10-CM | POA: Diagnosis not present

## 2021-02-18 DIAGNOSIS — J84112 Idiopathic pulmonary fibrosis: Secondary | ICD-10-CM | POA: Diagnosis not present

## 2021-02-20 ENCOUNTER — Other Ambulatory Visit (HOSPITAL_COMMUNITY): Payer: Self-pay | Admitting: Internal Medicine

## 2021-02-20 DIAGNOSIS — R011 Cardiac murmur, unspecified: Secondary | ICD-10-CM

## 2021-02-21 ENCOUNTER — Other Ambulatory Visit: Payer: Self-pay | Admitting: Pharmacist

## 2021-02-21 DIAGNOSIS — J84112 Idiopathic pulmonary fibrosis: Secondary | ICD-10-CM

## 2021-02-21 MED ORDER — ESBRIET 801 MG PO TABS: 1.0000 | ORAL_TABLET | Freq: Three times a day (TID) | ORAL | 1 refills | Status: DC

## 2021-02-21 NOTE — Telephone Encounter (Signed)
Received faxed refill request from [facility]. Patient was last seen in office by Dr. [name] on 10/22/20. Plan was to continue Esbriet as prescribed at 820m three times daily.  Last AST 26 and ALT 23 [facility]  He has appt with Dr. RChase Calleron 04/03/21 with PFT scheduled for same day.  [facility] for Esbriet sent to [facility]  DKnox Saliva PharmD, MPH, BCPS Clinical Pharmacist (Rheumatology and Pulmonology)

## 2021-02-26 ENCOUNTER — Encounter: Payer: Self-pay | Admitting: Internal Medicine

## 2021-03-05 DIAGNOSIS — M4726 Other spondylosis with radiculopathy, lumbar region: Secondary | ICD-10-CM | POA: Diagnosis not present

## 2021-03-06 ENCOUNTER — Other Ambulatory Visit: Payer: Self-pay | Admitting: Neurosurgery

## 2021-03-06 ENCOUNTER — Telehealth: Payer: Self-pay

## 2021-03-06 DIAGNOSIS — M5416 Radiculopathy, lumbar region: Secondary | ICD-10-CM

## 2021-03-06 DIAGNOSIS — E538 Deficiency of other specified B group vitamins: Secondary | ICD-10-CM | POA: Diagnosis not present

## 2021-03-06 NOTE — Telephone Encounter (Signed)
Called and spoke with patient directly to let him know that per Ashlyn Bruning, PA-C and [name]: he is able to have his MRI tomorrow without any issues/concerns regarding his gold fiducial markers. Patient verbalized understanding and agreement. Patient expressed appreciation of call and denied any other needs at this time

## 2021-03-07 ENCOUNTER — Ambulatory Visit
Admission: RE | Admit: 2021-03-07 | Discharge: 2021-03-07 | Disposition: A | Discharge status: Home / Self Care | Payer: PPO | Source: Ambulatory Visit | Attending: Neurosurgery | Admitting: Neurosurgery

## 2021-03-07 ENCOUNTER — Other Ambulatory Visit: Payer: Self-pay

## 2021-03-07 DIAGNOSIS — M5416 Radiculopathy, lumbar region: Secondary | ICD-10-CM

## 2021-03-07 DIAGNOSIS — M4856XA Collapsed vertebra, not elsewhere classified, lumbar region, initial encounter for fracture: Secondary | ICD-10-CM | POA: Diagnosis not present

## 2021-03-07 DIAGNOSIS — M5137 Other intervertebral disc degeneration, lumbosacral region: Secondary | ICD-10-CM | POA: Diagnosis not present

## 2021-03-07 MED ORDER — GADOBENATE DIMEGLUMINE 529 MG/ML IV SOLN
17.0000 mL | Freq: Once | INTRAVENOUS | Status: AC | PRN
  Administered 2021-03-07: 17 mL via INTRAVENOUS

## 2021-03-11 ENCOUNTER — Other Ambulatory Visit: Payer: Self-pay

## 2021-03-11 ENCOUNTER — Ambulatory Visit (HOSPITAL_COMMUNITY): Payer: PPO | Attending: Internal Medicine

## 2021-03-11 DIAGNOSIS — M4726 Other spondylosis with radiculopathy, lumbar region: Secondary | ICD-10-CM | POA: Diagnosis not present

## 2021-03-11 DIAGNOSIS — R011 Cardiac murmur, unspecified: Secondary | ICD-10-CM | POA: Diagnosis not present

## 2021-03-11 LAB — ECHOCARDIOGRAM COMPLETE
AR max vel: 1.79 cm2
AV Area VTI: 1.62 cm2
AV Area mean vel: 1.7 cm2
AV Mean grad: 12 mmHg
AV Peak grad: 21.3 mmHg
Ao pk vel: 2.31 m/s
Area-P 1/2: 1.64 cm2
S' Lateral: 2.6 cm

## 2021-03-12 DIAGNOSIS — R591 Generalized enlarged lymph nodes: Secondary | ICD-10-CM | POA: Diagnosis not present

## 2021-03-12 DIAGNOSIS — S3219XS Other fracture of sacrum, sequela: Secondary | ICD-10-CM | POA: Diagnosis not present

## 2021-03-12 DIAGNOSIS — M8448XA Pathological fracture, other site, initial encounter for fracture: Secondary | ICD-10-CM | POA: Diagnosis not present

## 2021-03-12 DIAGNOSIS — J8410 Pulmonary fibrosis, unspecified: Secondary | ICD-10-CM | POA: Diagnosis not present

## 2021-03-12 DIAGNOSIS — I10 Essential (primary) hypertension: Secondary | ICD-10-CM | POA: Diagnosis not present

## 2021-03-12 DIAGNOSIS — M810 Age-related osteoporosis without current pathological fracture: Secondary | ICD-10-CM | POA: Diagnosis not present

## 2021-03-12 DIAGNOSIS — S32058S Other fracture of fifth lumbar vertebra, sequela: Secondary | ICD-10-CM | POA: Diagnosis not present

## 2021-03-13 DIAGNOSIS — C8590 Non-Hodgkin lymphoma, unspecified, unspecified site: Secondary | ICD-10-CM | POA: Diagnosis not present

## 2021-03-17 ENCOUNTER — Other Ambulatory Visit: Payer: Self-pay | Admitting: Internal Medicine

## 2021-03-17 DIAGNOSIS — J8410 Pulmonary fibrosis, unspecified: Secondary | ICD-10-CM

## 2021-03-17 DIAGNOSIS — R591 Generalized enlarged lymph nodes: Secondary | ICD-10-CM

## 2021-03-19 ENCOUNTER — Ambulatory Visit
Admission: RE | Admit: 2021-03-19 | Discharge: 2021-03-19 | Disposition: A | Discharge status: Home / Self Care | Payer: PPO | Source: Ambulatory Visit | Attending: Internal Medicine | Admitting: Internal Medicine

## 2021-03-19 DIAGNOSIS — Z8546 Personal history of malignant neoplasm of prostate: Secondary | ICD-10-CM | POA: Diagnosis not present

## 2021-03-19 DIAGNOSIS — M47812 Spondylosis without myelopathy or radiculopathy, cervical region: Secondary | ICD-10-CM | POA: Diagnosis not present

## 2021-03-19 DIAGNOSIS — I6523 Occlusion and stenosis of bilateral carotid arteries: Secondary | ICD-10-CM | POA: Diagnosis not present

## 2021-03-19 DIAGNOSIS — J8410 Pulmonary fibrosis, unspecified: Secondary | ICD-10-CM

## 2021-03-19 DIAGNOSIS — R590 Localized enlarged lymph nodes: Secondary | ICD-10-CM | POA: Diagnosis not present

## 2021-03-19 DIAGNOSIS — R591 Generalized enlarged lymph nodes: Secondary | ICD-10-CM

## 2021-03-19 DIAGNOSIS — I700 Atherosclerosis of aorta: Secondary | ICD-10-CM | POA: Diagnosis not present

## 2021-03-19 MED ORDER — IOPAMIDOL (ISOVUE-300) INJECTION 61%
75.0000 mL | Freq: Once | INTRAVENOUS | Status: AC | PRN
  Administered 2021-03-19: 75 mL via INTRAVENOUS

## 2021-03-21 DIAGNOSIS — E785 Hyperlipidemia, unspecified: Secondary | ICD-10-CM | POA: Diagnosis not present

## 2021-03-25 ENCOUNTER — Ambulatory Visit (INDEPENDENT_AMBULATORY_CARE_PROVIDER_SITE_OTHER): Payer: PPO | Admitting: Otolaryngology

## 2021-03-25 DIAGNOSIS — R590 Localized enlarged lymph nodes: Secondary | ICD-10-CM | POA: Diagnosis not present

## 2021-03-25 HISTORY — PX: DEEP NECK LYMPH NODE BIOPSY / EXCISION: SUR126

## 2021-03-26 ENCOUNTER — Encounter: Payer: Self-pay | Admitting: Internal Medicine

## 2021-03-26 ENCOUNTER — Ambulatory Visit (INDEPENDENT_AMBULATORY_CARE_PROVIDER_SITE_OTHER): Payer: PPO | Admitting: Internal Medicine

## 2021-03-26 VITALS — BP 116/60 | HR 90 | Ht 69.0 in | Wt 167.4 lb

## 2021-03-26 DIAGNOSIS — R195 Other fecal abnormalities: Secondary | ICD-10-CM | POA: Diagnosis not present

## 2021-03-26 DIAGNOSIS — K589 Irritable bowel syndrome without diarrhea: Secondary | ICD-10-CM

## 2021-03-26 DIAGNOSIS — R110 Nausea: Secondary | ICD-10-CM

## 2021-03-26 DIAGNOSIS — K219 Gastro-esophageal reflux disease without esophagitis: Secondary | ICD-10-CM

## 2021-03-26 MED ORDER — SUTAB 1479-225-188 MG PO TABS: 1.0000 | ORAL_TABLET | Freq: Once | ORAL | 0 refills | Status: AC

## 2021-03-26 NOTE — Progress Notes (Signed)
HISTORY OF PRESENT ILLNESS:  Kenneth [name] is a 80 y.o. male, retired [employer] and previous patient of Dr. Verl Blalock, with a history of GERD and irritable bowel syndrome.  He presents today regarding Hemoccult positive stool, ongoing issues with irritable bowel syndrome, and nausea.  He was last seen in this office in 2015 when I performed upper endoscopy to evaluate nausea and atypical chest pain.  The examination was normal.  He has had significant interval change in his clinical status.  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  He thinks that this medication results in nausea and worsening loose bowels.  For his nausea he takes Zofran, which generally helps.  For his loose bowels he will take Imodium (and occasionally hydrocodone).  He may subsequently developed constipation and need MiraLAX.  He does take aspirin and Voltaren.  He did complete external beam radiation for prostate cancer in May 2022.  He has had issues with fatigue thereafter.  He reports 20 pound weight loss.  He was evaluated by his PCP and found to have Hemoccult positive stool.  I have requested, received, and reviewed those outside records.  Hemoglobin from February 07, 2021 was slightly depressed at 11.8.  Normal MCV.  Subsequent iron studies were normal including ferritin of 122 and iron saturation of 37%.  His renal function is normal.  Due to the presence of Hemoccult positive stool, he is referred by his PCP.  He did undergo complete colonoscopy with Dr. Sharlett Iles, with excellent prep, June 2013.  He was found to have mild left-sided diverticulosis.  Examination was otherwise normal.  Repeat colonoscopy in 10 years recommended.  He did undergo CT scan of the abdomen and pelvis with and without contrast November 2021.  Prostate abnormalities noted.  As well incidental cholelithiasis.  He does take Nexium 40 mg daily for his GERD.  This controls symptoms.  For his IBS related abdominal cramping he  will use Bentyl.  Finally, he tells me that he had a left cervical lymph node biopsy yesterday  REVIEW OF SYSTEMS:  All non-GI ROS negative unless otherwise stated in the HPI except for back pain, fatigue, shortness of breath, swollen lymph glands  Past Medical History:  Diagnosis Date   Arthritis    Diverticulosis of colon (without mention of hemorrhage)    GERD (gastroesophageal reflux disease)    Hiatal hernia    HLD (hyperlipidemia)    Internal hemorrhoids without mention of complication    Irritable bowel syndrome    Other specified gastritis without mention of hemorrhage    Prostate cancer ([place])    Pulmonary fibrosis ([place])    Stress fracture 02/2021    Past Surgical History:  Procedure Laterality Date   CARPAL TUNNEL RELEASE     left   CATARACT EXTRACTION  06/2011   bilateral   DEEP NECK LYMPH NODE BIOPSY / EXCISION  03/25/2021   INGUINAL HERNIA REPAIR Right 09/05/2019   Procedure: OPEN REPAIR RIGHT INGUINAL HERNIA WITH MESH;  Surgeon: Armandina Gemma, MD;  Location: [facility];  Service: General;  Laterality: Right;   [place]   x 2   LUMBAR LAMINECTOMY/DECOMPRESSION MICRODISCECTOMY Left 07/11/2013   Procedure: LUMBAR ONE TO TWO, LUMBAR TWO TO THREE, LUMBAR THREE TO FOUR LUMBAR LAMINECTOMY/DECOMPRESSION MICRODISCECTOMY 3 LEVELS;  Surgeon: Charlie Pitter, MD;  Location: [facility];  Service: Neurosurgery;  Laterality: Left;   NASAL SINUS SURGERY      Social History Marylyn Ishihara  A Parkinson  reports that he has never smoked. He has never used smokeless tobacco. He reports current alcohol use. He reports that he does not use drugs.  family history includes Bladder Cancer in his mother; Breast cancer in his mother; Heart failure (age of onset: 40) in his father.  Allergies  Allergen Reactions   Ezetimibe    Omeprazole    Rosuvastatin    Simvastatin        PHYSICAL EXAMINATION: Vital signs: BP 116/60   Pulse 90   Ht _0  (1.753 m)   Wt 167 lb 6 oz (75.9  kg)   BMI 24.72 kg/m   Constitutional: generally well-appearing, no acute distress Psychiatric: alert and oriented x3, cooperative Eyes: extraocular movements intact, anicteric, conjunctiva pink Mouth: oral pharynx moist, no lesions Neck: supple no obvious lymphadenopathy Cardiovascular: heart regular rate and rhythm, no murmur Lungs: clear to auscultation bilaterally Abdomen: soft, nontender, nondistended, no obvious ascites, no peritoneal signs, normal bowel sounds, no organomegaly Rectal: Deferred until colonoscopy Extremities: no clubbing, cyanosis, or lower extremity edema bilaterally Skin: no lesions on visible extremities Neuro: No focal deficits.  Cranial nerves intact  ASSESSMENT:  1.  Hemoccult positive stool.  Rule out underlying GI mucosal abnormality. 2.  Irritable bowel syndrome.  Diarrhea predominant, though may alternate. 3.  GERD.  Controlled with Nexium 4.  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  Stable 5.  Nausea.  May be medication related 6.  Normocytic anemia without evidence of iron deficiency   PLAN:  1.  Colonoscopy to evaluate Hemoccult-positive stool and worsening IBS symptoms..  Last colonoscopy as described 2013.The nature of the procedure, as well as the risks, benefits, and alternatives were carefully and thoroughly reviewed with the patient. Ample time for discussion and questions allowed. The patient understood, was satisfied, and agreed to proceed.  2.  Upper endoscopy to evaluate Hemoccult-positive stool and worsening nausea.  Normal upper endoscopy 2015.The nature of the procedure, as well as the risks, benefits, and alternatives were carefully and thoroughly reviewed with the patient. Ample time for discussion and questions allowed. The patient understood, was satisfied, and agreed to proceed.  3.  Citrucel 2 tablespoons daily in 14 ounces of water or  juice.  This in hopes of better regulating bowel irregularities A total time of 60 minutes was spent preparing to see the patient, reviewing laboratories, x-rays, and prior endoscopy reports.  Also, obtaining comprehensive history, performing medically appropriate physical examination, counseling the patient regarding the above listed issues, ordering advanced procedures and methylcellulose therapy.  Finally, documenting clinical information in the health record

## 2021-03-26 NOTE — Patient Instructions (Signed)
If you are age 79 or older, your body mass index should be between 23-30. Your Body mass index is 24.72 kg/m. If this is out of the aforementioned range listed, please consider follow up with your Primary Care Provider.  If you are age 55 or younger, your body mass index should be between 19-25. Your Body mass index is 24.72 kg/m. If this is out of the aformentioned range listed, please consider follow up with your Primary Care Provider.   __________________________________________________________  The [facility] providers would like to encourage you to use [facility] to communicate with providers for non-urgent requests or questions.  Due to long hold times on the telephone, sending your provider a message by [facility] may be a faster and more efficient way to get a response.  Please allow 48 business hours for a response.  Please remember that this is for non-urgent requests.   You have been scheduled for an endoscopy and colonoscopy. Please follow the written instructions given to you at your visit today. Please pick up your prep supplies at the pharmacy within the next 1-3 days. If you use inhalers (even only as needed), please bring them with you on the day of your procedure.

## 2021-04-01 ENCOUNTER — Other Ambulatory Visit (HOSPITAL_COMMUNITY): Payer: Self-pay | Admitting: *Deleted

## 2021-04-02 ENCOUNTER — Ambulatory Visit (HOSPITAL_COMMUNITY)
Admission: RE | Admit: 2021-04-02 | Discharge: 2021-04-02 | Disposition: A | Discharge status: Home / Self Care | Payer: PPO | Source: Ambulatory Visit | Attending: Internal Medicine | Admitting: Internal Medicine

## 2021-04-02 ENCOUNTER — Other Ambulatory Visit: Payer: Self-pay

## 2021-04-02 DIAGNOSIS — L821 Other seborrheic keratosis: Secondary | ICD-10-CM | POA: Diagnosis not present

## 2021-04-02 DIAGNOSIS — C44319 Basal cell carcinoma of skin of other parts of face: Secondary | ICD-10-CM | POA: Diagnosis not present

## 2021-04-02 DIAGNOSIS — M810 Age-related osteoporosis without current pathological fracture: Secondary | ICD-10-CM | POA: Diagnosis not present

## 2021-04-02 DIAGNOSIS — D485 Neoplasm of uncertain behavior of skin: Secondary | ICD-10-CM | POA: Diagnosis not present

## 2021-04-02 DIAGNOSIS — Z85828 Personal history of other malignant neoplasm of skin: Secondary | ICD-10-CM | POA: Diagnosis not present

## 2021-04-02 DIAGNOSIS — L570 Actinic keratosis: Secondary | ICD-10-CM | POA: Diagnosis not present

## 2021-04-02 MED ORDER — DENOSUMAB 60 MG/ML ~~LOC~~ SOSY
PREFILLED_SYRINGE | SUBCUTANEOUS | Status: AC
  Filled 2021-04-02: qty 1

## 2021-04-02 MED ORDER — DENOSUMAB 60 MG/ML ~~LOC~~ SOSY
60.0000 mg | PREFILLED_SYRINGE | Freq: Once | SUBCUTANEOUS | Status: AC
  Administered 2021-04-02: 60 mg via SUBCUTANEOUS

## 2021-04-03 ENCOUNTER — Other Ambulatory Visit (HOSPITAL_COMMUNITY): Payer: Self-pay

## 2021-04-03 ENCOUNTER — Ambulatory Visit (INDEPENDENT_AMBULATORY_CARE_PROVIDER_SITE_OTHER): Payer: PPO | Admitting: Internal Medicine

## 2021-04-03 ENCOUNTER — Telehealth: Payer: Self-pay | Admitting: Pharmacist

## 2021-04-03 ENCOUNTER — Encounter: Payer: Self-pay | Admitting: Internal Medicine

## 2021-04-03 ENCOUNTER — Other Ambulatory Visit (INDEPENDENT_AMBULATORY_CARE_PROVIDER_SITE_OTHER): Payer: PPO

## 2021-04-03 VITALS — BP 118/60 | HR 88 | Temp 97.5°F | Ht 69.0 in | Wt 168.2 lb

## 2021-04-03 DIAGNOSIS — K589 Irritable bowel syndrome without diarrhea: Secondary | ICD-10-CM | POA: Diagnosis not present

## 2021-04-03 DIAGNOSIS — R5382 Chronic fatigue, unspecified: Secondary | ICD-10-CM | POA: Diagnosis not present

## 2021-04-03 DIAGNOSIS — J84112 Idiopathic pulmonary fibrosis: Secondary | ICD-10-CM | POA: Diagnosis not present

## 2021-04-03 DIAGNOSIS — R634 Abnormal weight loss: Secondary | ICD-10-CM | POA: Diagnosis not present

## 2021-04-03 DIAGNOSIS — I288 Other diseases of pulmonary vessels: Secondary | ICD-10-CM

## 2021-04-03 DIAGNOSIS — Z5181 Encounter for therapeutic drug level monitoring: Secondary | ICD-10-CM | POA: Diagnosis not present

## 2021-04-03 DIAGNOSIS — Z006 Encounter for examination for normal comparison and control in clinical research program: Secondary | ICD-10-CM

## 2021-04-03 DIAGNOSIS — R590 Localized enlarged lymph nodes: Secondary | ICD-10-CM

## 2021-04-03 LAB — PULMONARY FUNCTION TEST
DL/VA % pred: 91 %
DL/VA: 3.6 ml/min/mmHg/L
DLCO cor % pred: 67 %
DLCO cor: 16.15 ml/min/mmHg
DLCO unc % pred: 67 %
DLCO unc: 16.15 ml/min/mmHg
FEF 25-75 Pre: 2.56 L/sec
FEF2575-%Pred-Pre: 132 %
FEV1-%Pred-Pre: 85 %
FEV1-Pre: 2.38 L
FEV1FVC-%Pred-Pre: 112 %
FEV6-%Pred-Pre: 81 %
FEV6-Pre: 2.95 L
FEV6FVC-%Pred-Pre: 107 %
FVC-%Pred-Pre: 75 %
FVC-Pre: 2.96 L
Pre FEV1/FVC ratio: 81 %
Pre FEV6/FVC Ratio: 100 %

## 2021-04-03 LAB — HEPATIC FUNCTION PANEL
ALT: 17 U/L (ref 0–53)
AST: 20 U/L (ref 0–37)
Albumin: 4.2 g/dL (ref 3.5–5.2)
Alkaline Phosphatase: 81 U/L (ref 39–117)
Bilirubin, Direct: 0.1 mg/dL (ref 0.0–0.3)
Total Bilirubin: 0.4 mg/dL (ref 0.2–1.2)
Total Protein: 6.9 g/dL (ref 6.0–8.3)

## 2021-04-03 NOTE — Telephone Encounter (Signed)
Patient is interested in generic Esbriet.  Currently fills at [facility].  Dose: 864m three times daily  Pharmacy team will ensure prior authorization is in place for generic or if his insurance prefers brand name, then we will reach out to patient to advise.  DKnox Saliva PharmD, MPH, BCPS Clinical Pharmacist (Rheumatology and Pulmonology)

## 2021-04-03 NOTE — Progress Notes (Signed)
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 He tells me that this summer 2018*noticing insidious onset of shortness of breath particularly with walking stairs in the house or [place]. This was not that in the previous years. The summer 2018 he was in [place] but did not notice much shortness of breath. Otherwise he is able to do his activities of daily living and played golf with a car. He is more bothered by his back pain and sciatica as a result of previous back surgery. Shortness of breath is extremely mild. He says he became more aware of it after the radiologic investigations documented below. He had a chest x-ray that suggested interstitial findings and therefore he underwent a high-resolution CT scan of the chest that is described below. I personally visualized this high resolution CT chest and to me it shows bilateral bibasal subpleural reticulation that also extends to the upper lobes without any zonal predominance. There is no obvious honeycombing but there is traction bronchiectasis. There no mediastinal adenopathy. Therefore he is been referred here. In terms of exposure history other than radiation exposure he has not been on any pulmonary toxic drugs or any mold exposure or asbestos exposure. Does have GERD and is on PPI and is well ocntrolled   Does have spring and perennial allergies - sneezes easy. Allergy test with [facility] negative some years ago  [employer] chest physicians interstitial lung disease questionnaire: He says that he does not cough. He is only trouble with dyspnea with strenuous exercise and it started 4 months ago. Past medical history significant for acid reflux. He has never smoked any tobacco or street drugs. Does not have any family history of lung disease.  At home he does not have any humidifier sound birds heart the water damage or mold. 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 He has standard radiation exposure while at work. The standard radiation for a radiologist. He does not take any pulmonary toxic drugs. House is 79 years old and he worries about mold. He sneezes if air is forced   Pulmonary function test shows isolated reduction in diffusion capacity to 62%. Walking desaturation test underneath her feet 3 laps on room air: Resting pulse ox 96%. Final pulse ox 95%. Heart rate resting was 92/m and went up to 109 minute.   Results for MIKLO, AKEN (MRN [id number]) as of 03/23/2017 10:04  Ref. Range 03/04/2017 09:13  FVC-Pre Latest Units: L 3.59  FVC-%Pred-Pre Latest Units: % 86  Results for Kenneth [name] (MRN [id number]) as of 03/23/2017 10:04  Ref. Range 03/04/2017 09:13  DLCO cor Latest Units: ml/min/mmHg 20.06  DLCO cor % pred Latest Units: % 63    Lungs/Pleura: There is subpleural reticulation with scattered ground-glass and traction bronchiolectasis, without a definite zonal predominance. No definitive honeycombing. Findings appear mildly progressive when radiographs dating back to 12/28/2008 are reviewed. Probable 5 mm subpleural lymph node along the right major fissure. No pleural fluid. Airway is unremarkable.      IMPRESSION: 1. Pulmonary parenchymal findings of interstitial lung disease which may be due to fibrotic nonspecific interstitial pneumonitis or, given slight progression over time, usual interstitial pneumonitis. 2. Aortic atherosclerosis (ICD10-170.0). Coronary artery calcification. 3. Aortic aneurysm  NOS (ICD10-I71.9). 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 Recommend annual imaging followup by CTA or MRA. This recommendation follows 2010 ACCF/AHA/AATS/ACR/ASA/SCA/SCAI/SIR/STS/SVM Guidelines for the Diagnosis and Management of Patients with Thoracic  Aortic Disease. Circulation. 2010; 121: G182-X937. 4. Prominence of the right and left main pulmonary arteries can be seen with pulmonary arterial hypertension.     Electronically Signed   By: [name]   On: 02/17/2017 13:41      OV 03/23/2017  Chief Complaint  Patient presents with   Follow-up    SOB on exertion. Other than that he has been doing about the same. Denies any cough or CP.   Here to discuss results. [name]. 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 In inteirm no new symptoms.  Autoimmune profile negative. They raise possibility of 2nd opinion.  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 Progression is based on CXR comparison since 2010; there is no CT and is only suspicion of progression  Results for Kenneth [name] (MRN [id number]) as of 03/23/2017 10:04  Ref. Range 12/08/2011 16:44 03/04/2017 13:47  Anit Nuclear Antibody(ANA) Latest Ref Range: NEGATIVE   NEG  Angiotensin-Converting Enzyme Latest Ref Range: 9 - 67 U/L  31  Cyclic Citrullin Peptide Ab Latest Units: Units  <16  ds DNA Ab Latest Units: IU/mL  <1  ENA RNP Ab Latest Ref Range: 0.0 - 0.9 AI  0.2  Endomysial Screen Latest Ref Range: NEGATIVE  NEGATIVE   Deamidated Gliadin Abs, IgG Latest Ref Range: <20 U/mL 4.1   Gliadin IgA Latest Ref Range: <20 U/mL 1.8   RA Latex Turbid. Latest Ref Range: <14 IU/mL  <14  Tissue Transglut Ab Latest Ref Range: <20 U/mL 3.9   Tissue Transglutaminase Ab, IgA Latest Ref Range: <20 U/mL 2.2   IgE (Immunoglobulin E), Serum Latest Ref Range: <115 kU/L  31  IgA Latest Ref Range: 68 - 379 mg/dL 132   SSA (Ro) (ENA) Antibody, IgG Latest Ref Range: <1.0 NEG AI   <1.0 NEG  SSB (La) (ENA) Antibody, IgG Latest Ref Range: <1.0 NEG AI   <1.0 NEG    OV 05/06/2017  Chief Complaint  Patient presents with   Follow-up    Follow up today after beginning Esbriet. Pt has no c/o SOB,  cough, or CP. Has had some mild nausea from the [place] but other than that, he has been doing good on it.   ollow-up idiopathic pulmonary fibrosis mild severity  He is here to follow-up He is on the third week of his bed at 3 pills 3 times a day max dose. So far is tolerating it fine other than mild nausea. He does take after meals. He plans to meet with [facility] coordinator for medication support. He did visit [place] this past weekend and did have vomiting especially after consuming few etoh drinks at a [employer]. He does apply sunscreen [his daughter is a Paediatric nurse in [place]. He still complains about the fact that and is 79 year old home when the air conditioner comes on and they air blows out of the duct system he does sneeze 10 times and his feet does feel cold. She plans to put hepa filters that are high and and also get the duct cleaning. He says he did have allergy evaluation and found few years ago and it was negative. Recently he has increased his rpinrole and alsogot  an injection for his back and he does feel better   OV 06/22/2017  Chief Complaint  Patient presents with   Follow-up    Pt still taking Esbriet and has been doing good except has been having issues with nausea x3 days. PFT done today. Still becomes SOB with exertion. Denies any complaints of cough or CP.   IPF followup  Routine followup. Now on esbriet 3 pills tid since end oct 2018. Having new nausea - moderate, x 3 days. Might be chills. But no associated diarrhea or other side effects. Spacing esbriet 4h only. Also has complaitns about high co pay. Unable to find charity. Doing exrcise with trainer - feeels that is better and more vigorous than rehab. Gets tachy with eercise but says he does not desaturate. Had PFT - stable. Lot of questions about disease    Walking desaturation test on 06/22/2017 185 feet x 3 laps on ROOM AIR:  did NOT desaturate. Rest pulse ox was 100%, final pulse ox was 99%. HR  response 84/min at rest to 97/min at peak exertion. Patient DONTAVION NOXON  Did not Desaturate < 88% . Kenneth [name] did not  Desaturated </= 3% points. Marylyn Ishihara A Mclees yes did get tachyardic   OV 09/20/2017  Chief Complaint  Patient presents with   Follow-up    PFT done today.  Pt states he has been doing good. Pt still taking Esbriet and states he is doing good on it.   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  Joen Laura for IPF follow-up.  Last visit December 2018.  Since then he has been compliant fully with his Pirfenidone (Esbriet).  His nausea resolved with Ginger intake.  However he is having 3-5 pound weight loss and also fatigue towards the end of the day and also low appetite.  He is exercising vigorously 5 times a week and he is wondering if the fatigue could be because of the heavy exercise.  He is not interested in lung transplantation.  He is open to research participation in interventional trial.  Currently he is on the IPF registry program.  Overall he is well.  He did go to [place] to visit his son and he did not wear sunscreen and he did pick up a stage I skin burn with erythema that was more than usual.  But this resolved.  I did remind him about his obligation to wear sunscreen at all times with Pirfenidone (Esbriet).  He is interested in interventional research trials.  His pulmonary function test to me on an average is stable even though there is decline in FVC that seems to be an increase in DLCO so that is variability.  His walking desaturation test is around the same. He is interested in rolling over to the larger capsule of the Pirfenidone (Esbriet) so we will have to take it only 3 times a day. ESberit is cposting him $500/month or more due to high AGI and this is frustrating for him   Walking desaturation test on 09/20/2017 185 feet x 3 laps on ROOM AIR:  did walk briskly all 3 laps with only mild dyspnea desaturate. Rest pulse ox was 98%, final pulse ox was 98%. HR response 96/min at rest to  107/min at peak exertion. Patient SYDNEY AZURE  Did not Desaturate < 88% . Kenneth [name] did not  Desaturated </= 3% points. Guill[place] yes did get tachyardic this appears similar to a few months ago   OV 12/07/2017  Chief Complaint  Patient presents with   Follow-up    Pt had pre Arlyce Harman and DLCO prior to OV today. Pt has increase of SOB with exertion more with walking up the hill.   Dr. Annamaria Boots presents for follow-up of his IPF to ILD clinic.  In terms of his IPF he feels stable.  In fact he tells me that if not for the incidental chest x-ray and a subsequent CT scan that picked up pulmonary fibrosis he even to this day will not know that he has IPF.  He only has a mild exertional dyspnea class I which she always believes in the past that it was because of aging.  At this point in time he is on Esbriet 1 big pill 3 times daily at full dose.  He is now applying sunscreen and has not had any further sunburns.  He is following an extremely intense exercise fitness regimen to improve his cardiac conditioning.  He believes his exertional heart rate and resting heart rate are much improved than before.  He works with a Clinical research associate at [employer].  His main issue is from Pirfenidone [facility]).  He had some mild nausea that has now resolved with ginger capsules but he continues to have some amount of fatigue low appetite and some mild weight loss.  He says this is mild and all tolerable.  The fatigue happens after working a lot in the yard and he loves yard work.  He wants to know if he can continue to do yard work.  Recently he did have some sinus congestion and took some steroids and antibiotics for it and he feels better but he still has some sinus fullness.  He had spirometry and simple walking desaturation test and the profile is below and I believe these are stable.  In fact his exertional heart rate is improved compared to previous.  Today he also participated in the IPF registry program.  There is a  noninterventional trial.  He is interested in future interventional trials.   OV 03/08/2018  Chief Complaint  Patient presents with   Follow-up    PFT performed today. Pt states he has been doing well except he has been having problems with nausea and dizziness, and loss of appetite.   Kenneth [name] - presents for follow-up. For his IPF. He is on esbruet now. He was tolerating his Pirfenidone (Esbriet) quite well other than minor side effects till last visit. However in thesummer of 2019e's had increased nauseaand also weight loss. He has lost 6 pounds since prior visit. Overall he is lost 11 pounds since September 2018. We started the medication esbeit  anti-fibrotic for him in October 2018. His pants are getting looser.he is also having significant fatigue. Nevertheless he does not want to switch to the of anti-fibrotic ofev because of prior history of irritable bowel syndrome. He says that his baseline irritable bowel syndrome is worse than any side effects he is having with esbriet especially the fatigue.e is open to a transplant evaluation and consideration of research protocols. His spirometry shows stability with FVC but his DLCO might be declining.Marland Kitchen His simple walk test did not show anychange. He is not noticing any subjective changes in dyspnea on effort tolerance while working out although he does admit to increased dizziness particularly when playing golf. During this time recently has not monitored his pulse ox but in the past and never went below 92%.  Other issues: He had some myalgia despite change in his statin. He  will address this with his primary care. 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 Primary care has ordered a follow-up CT scan.This can be combined with a high risk CT scan. I I have reached out to  the thoracic radiology     OV 05/10/2018  Subjective:  Patient ID: Kenneth [name], male , DOB: [birth year] , age 42 y.o. , MRN: [id number] , ADDRESS: [address] [place] [id number]   05/10/2018 -   Chief Complaint  Patient presents with   Follow-up    review spiro with dlco.  c/o stable doe.  On 860m Esbriet TID, notes occ dizziness, loss of appetite.      HPI KDAREL RICKETTS89y.o. -returns-year follow-up.  Diagnosis made towards the end of 2018.  He has been on Esbriet since then.  His main issues have been weight loss.  He lost 10 pounds of weight at the time of last visit.  At the last visit he weighed 174 pounds in September 2019.  Back in August 2018 he weighed 186 pounds.  All this weight loss happen after he started Pirfenidone (Esbriet).  However he is now stabilized and he continues to be 874 pounds.  He is able to do activities of daily living and exercise but when he does stairs he gets dyspneic.  Overall he feels stable in the last 1 year in terms of his effort tolerance.  His main thing is that he has no appetite.  He has been communicating with the Pirfenidone (Esbriet) supporting about how to manage his low appetite and occasional nausea.  We discussed the options about taking brief holidays or reduction in dose.  He is willing to try this.  He does take occasional ginger.  Of note he had a transplant [facility] because he was out of network.  He tells me that his insurance company only advised that he would have a higher co-pay so is a little bit puzzled by this.  Since then he said conversation with his primary care physician and he wants to see Dr. DShana Chutewho is a son-in-law to his friend Dr. BLindwood Quaretired sPsychologist, sport and exercise  I know Dr. GLauris Chromanquite well from an IPF standpoint.  And therefore wholeheartedly supported the idea.  In terms of his lung function testing his FVC shows a decline although he is feeling the same.  On miniature walking test he seems a little more tachycardic but he says he feels the same.  He manages this heart rate with his exercise.  His  DLCO with some variability appears stable.  He had a CT angiogram for thoracic aortic aneurysm in September 2019 and this when compared to one year earlier has been reported as stable but the different resolutions.  I offered to do a high-resolution CT chest here but he wants to hold off until he saw Dr. GLauris Chroman  OV 08/23/2018  Subjective:  Patient ID: KWestley [name] male , DOB: 4February 18, 1942, age 80y.o. , MRN: [id number], [place] [id number]  08/23/2018 -   Chief Complaint  Patient presents with   Follow-up    PFT performed today. Pt states he has been doing well since last visit and denies any complaints.   IPF followup"   Diagnosis made towards the end of 2018.  He has been on Esbriet since then.   HPI KMarylyn Ishihara  A Minch 80 y.o. -Dr. Ronny Flurry presents for follow-up of his IPF.  He continues to feel good.  His main issue is tolerating the Esbriet which gives some fatigue.  Sometimes he has to postpone it.  In terms of his dyspnea he continues to exercise.  His dyspnea symptom score is really minimal.  His cough is nonexistent.  He was losing weight with Esbriet but now he is gained his weight back.  In the interim he did see Dr. Shana Chute June 23, 2018 at [facility].  He had a satisfactory visit.  He thought he was stable.  His next visit with Dr. Lauris Chroman will be in June 2020.  Moving forward he wants to alternate every 6 months between myself and Dr. Lauris Chroman at [facility] which will give him 3 months with 1 pulmonologist or the other.  He is beginning to get interested in research studies.  We did pulmonary function test on him today and is documented below.  The FVC appears a fluctuant but the DLCO definitely appears on a downward trend.  The new DLCO is on a global lung initiated [GL I] equation and therefore the percent predicted is higher.  But the absolute value showed downward trend line.  We went over this.  He definitely does not feel this subjectively.  His  last high-resolution CT scan of the chest was in August of 2018.  He is willing to have one at follow-up.  He wants to see me again in 6 months.  His simple walking desaturation test appears stable.  He is thinking of making a trip to [place] and wants to know if he should wear a mask.    OV 04/21/2019  Subjective:  Patient ID: Kenneth [name], male , DOB: Nov 14, 1940 , age 64 y.o. , MRN: [id number] , ADDRESS: [place] [id number]   04/21/2019 -   Chief Complaint  Patient presents with   Follow-up    Patient reports that his breathing is doing well at this time.    IPF followup. On esbriet  HPI DORTHY HUSTEAD 80 y.o. -presents for follow-up of his idiopathic pulmonary fibrosis.  Last visit was pre-pandemic in February 2018.  In the interim he did see Dr. Shana Chute at [facility] and deemed to be stable.  At this point in time he is reporting continued stability with the symptoms as seen by the score below.  However I did notice that he is lost lot of weight.  His BMI with his clothes on is 25 with a weight of 170 pounds.  He says his dry weight is actually around 162 pounds.  He feels that this is the stable weight in the last 6 months.  He says he is not bothered by it.  He says this is because of low appetite because of the and pirfenidone.  He feels the pirfenidone is benefiting him and he continues to be is to be stable.  In fact his pulmonary function test shows " improvement".  He said that he felt the test today was somewhat variable and is effort based on the technician.  He had a recent high-resolution CT chest that shows continued stability.  I personally visualized this film.  At this point in time he is happy taking the pirfenidone.  But he did agree if there is further weight loss into the 150s pounds then we could reassess  Had a lot of discussion about COVID-19 and about appropriate risk reduction measures  which he is continuing to do.  He did some read some  literature about COVID-19 and had questions about it. He in Vit D supplementation and is agreeable to get his levels checked following literature about protective effect of Vit D  Other than that he reports 2 episodes of dizziness and in one of those he nearly passed out.  He was wondering if some of the symptoms were related to ropinirole and therefore he has stopped it.  I reviewed the literature with him and the significant amount of dizziness and hypotension.  And also GI symptoms which can probably accentuated with pirfenidone.   OV 10/16/2019  Subjective:  Patient ID: Kenneth [name], male , DOB: 1940/11/15 , age 26 y.o. , MRN: [id number] , ADDRESS: [place] [id number]   10/16/2019 -   Chief Complaint  Patient presents with   Follow-up    PFT performed 3/31.  Pt states he is doing okay. States his breathing is about the same since last visit.     HPI NEZIAH BRALEY 80 y.o.  -follow-up IPF on pirfenidone    -ROS   67-monthfollow-up for Dr. YAnnamaria Boots  He continues on pirfenidone.  He is taking full dose.  In the interim he has had hernia surgery 6 weeks ago for right inguinal hernia.  This is gone well.  He is change his exercise from bicycle to walking because of the hernia.  His symptom score is the same.  [facility] 2021 [facility]  Deemed to be stable.  I reviewed that note.  After that he has had pulmonary function test with uKoreacouple weeks ago.  This shows a decline.  He says the quality of instructions was poor and therefore the decline is because of that.  At least that is his suspicion.  He feels well.  Walking desaturation test is stable.  He has gained some weight.  He continues to have some nausea and fatigue and diarrhea with the pirfenidone.  But this is stable and manageable.  He feels his irritable bowel syndrome is active because of the pirfenidone.  He plans to see Dr. JJoanell Risingfor that.  His last liver  function test was with Dr. GLauris Chromanin January 2021.  This was normal per the note.  His last CT scan was towards the end of 2020 and deemed to be stable.     OV 04/11/2020   Subjective:  Patient ID: KWestley [name] male , DOB: 406-22-1942 age 80y.o. years. , MRN: [id number]  [place] [address] [id number]  PCrist Infante MD Providers : Dr GAlisia Ferrari Treatment Team:  Attending Provider: RBrand Males MD   Chief Complaint  Patient presents with   Follow-up    PFT performed today.  Pt states he has been doing well since last visit and denies any complaints.    Follow-up idiopathic pulmonary fibrosis.  Diagnosis towards the end of 2018.  On pirfenidone.  Last CT scan of the chest September 2020.   HPI KJAYCEN VERCHER89y.o. -presents for follow-up last seen in April 2021.  After that in July 2021 [facility]  I reviewed the notes.  The feeling was that he was stable.  He is only minimally symptomatic.  He had a 6-minute walk test and did really well without any significant desaturations.  He is now  resorted to high intensity interval training based on advice from a physical therapist/gym instructor.  He feels like stronger and failure.  He also states that his appetite is better and is eating better.  He feels his weight is stable.  In terms of his IPF symptoms is minimal and documented below.  He just has mild irritable bowel syndrome with his Esbriet and baseline health.  His walking desaturation test today in the office is stable.  His pulmonary function test was reviewed.  Over the last 3 years it shows a slight steady decline.  This decline is both in FVC and DLCO.  He is averaging around 2.6% decline per year with FVC while 8% decline in the DLCO per year.  On an average with a 3% decline per year in the last 3 years.  We did discuss this.  We discussed #pulmonary fibrosis foundation support group -he will visit the  website.  He gets newsletters from them.  At this point in time he is not interested in joining the support group but he did get the contact information for this and will reach out if he desires so.  #-We also discussed research as a care option -we discussed trial sponsored by [place] [facility] hospital of pirfenidone.  The trial is called [employer].  The investigation medical product is called PRM-[id number] in the axilla macrophage pathway.  Promising phase 2 data.  Research team gave him the consent form under my delegation.  He understands research is voluntary and is to evaluate unproven therapies.  He understands that the primary intent is to contribute to her scientific development and therapeutic gain is secondary because of an approved product the risks benefit ratio is very different compared to blood products.  He is going to read the consent and think about potential participation.  I sent him via email medical journal articles about the investigational product    He tells me that he feels his weight is stable.  His dry weight early in the morning is between 162-163 pounds.  I pointed out that his weight today is 168 pounds and is lower than before.  However he says that the difference rating 168-172 is related to clothing and his weight at home is stable.  He is not concerned about the slight weight loss.  Overall while he agrees he is lost weight since starting Esbriet he feels recently has been stable.  He wants to continue to monitor the situation  Irritable bowel: He said he will check with Dr. 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 Range 03/04/2017 09:13 06/22/2017 09:14 09/20/2017 08:54 12/07/2017 13:49 03/08/2018 10:15 05/10/2018 13:59 08/23/2018 09:29 04/21/2019 11:06 10/04/2019 09:32  04/11/20 10/21/20  FVC-Pre Latest Units: L 3.59 3.70 3.47 3.54 3.56 3.27 3.19 3.59 3.35 3.26 3.2  FVC-%Pred-Pre Latest Units: % 86 89 84 86 86 84 82 90 83 82% 81%  Results for ALSTON, BERRIE A (MRN [id number]) as of 10/16/2019 09:07  Ref. Range 03/04/2017 09:13 06/22/2017 09:14 09/20/2017 08:54 12/07/2017 13:49 03/08/2018 10:15 05/10/2018 13:59 08/23/2018 09:29 04/21/2019 11:06 10/04/2019 09:32 04/11/20 10/21/20  DLCO unc Latest Units: ml/min/mmHg 20.12 19.77 24.06 21.20 19.63 19.04 18.61 20.89 16.69 17.26 13.6  DLCO unc % pred Latest Units: % 63  62 76 66 62 64 79 86 69 72% 56%   OV 10/22/2020  Subjective:  Patient ID: Kenneth [name], male , DOB: 02-17-1941 , age 43 y.o. , MRN: [id number] , ADDRESS: [place] [id number] PCP [name], MD Patient Care Team: [name], MD as PCP - General (Internal Medicine) Cira Rue, RN as Oncology Nurse Navigator  This Provider for this visit: Treatment Team:  Attending Provider: Brand Males, MD    10/22/2020 -   Chief Complaint  Patient presents with   Follow-up    Fatigue from radiation treatments, more winded and more GI upset from radiation treatments    Follow-up idiopathic pulmonary fibrosis.  Diagnosis towards the end of 2018.  On pirfenidone.  Last CT scan of the chest September 2020.    HPI ARDEN TINOCO 80 y.o. -returns for follow-up.  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  He is finished hormonal treatment and now is on radiation treatment.  Radiation treatment is aggravating his GI side effects from Pirfenidone (Esbriet) and from irritable bowel syndrome.  Is also causing fatigue.  He went August a masters in walk the tournament.  This made him more fatigued than what he experienced few years ago.  He said it took him a few days to recover.  He used to have weight loss but this is now stabilized it is documented below.  His symptom severity score and his walking desaturation test are stable but his pulmonary  function test continues to show decline.  His DLCO shows a relatively more decline but he said there were technique issues today.  He is got upcoming appointment with ILD clinic at [facility] in July.  At this point in time he is interested in phase 3 clinical trials.  We have quite a few to off as of summer 2022 but his prostate cancer could be an exclusion.  We will have the research team evaluate.  He is saddened by the loss of his friend Dr. March Rummage.  He is evaluating whether he should join the pulmonary fibrosis foundation at the national level to be a board member.     OV 04/03/2021  Subjective:  Patient ID: Kenneth [name], male , DOB: [birth year] , age 62 y.o. , MRN: [id number] , ADDRESS: [place] [id number] PCP [name], MD Patient Care Team: [name], MD as PCP - General (Internal Medicine) Cira Rue, RN as Oncology Nurse Navigator  This Provider for this visit: Treatment Team:  Attending Provider: Brand Males, MD    04/03/2021 -   Chief Complaint  Patient presents with   Follow-up    Just performed a partial PFT, patient states increased SOB.    Follow-up idiopathic pulmonary fibrosis.  Diagnosis towards the end of 2018.  On pirfenidone.  Last CT scan of the chest September 2020. - > sept 2022  Underlying irritable bowel syndrome  Initial weight loss with pirfenidone and then stabilized  HPI Kenneth [name] 80 y.o. -Dr. Annamaria Boots presents for his IPF follow-up.  Since I last saw him he has had several health issues.  He got diagnosed with prostate cancer and then had radiation to that.  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  He is going to have a colonoscopy.  He feels his irritable bowel syndrome pirfenidone and ultimately the radiation made his GI symptoms significantly worse.  In order to combat the  fatigue he has been undergoing physical therapy.  He also try to get back to golf and  took a golf swing for practice and then states that he had a compression fracture in the spine.  This is probably also because of years of steroid injections in his back.  In addition when he had a high-resolution CT chest in September 2022 this month there was some neck nodes and he has had a neck CT scan followed by ENT visit to [name].  Apparently fine-needle aspiration was nondiagnostic.  He is scheduled to undergo excision biopsy.  The combination of all this is gotten a little bit more anxious and depressed.  As you can see his symptom scores for depression and anxiety are up.  He is also feeling a little more short of breath than usual.  He says his pulse ox is in the low 90s when he exerts himself but it still adequate.  Review of his symptoms and objective basis shows his symptom score to be the same.  His walking desaturation test is also the same.  His weight is stable.  His pulmonary function test shows stability in diffusion but a decline in FVC compared to earlier this year.  Had a high-resolution CT chest that only shows minimal progression fibrosis over 2 years.  Echocardiogram September 2022 did not show any evidence of pulmonary hypertension         SYMPTOM SCALE - ILD 08/23/2018  04/21/2019  10/16/2019  04/11/2020 168# 10/22/2020 170# - esbriet and XRT for prostate 04/03/2021 168#  O2 use ra e ra   ra  Shortness of Breath 0 -> 5 scale with 5 being worst (score 6 If unable to do)       At rest 0 0 0 0 0 0  Simple tasks - showers, clothes change, eating, shaving *0** 0 0 0 0 0  Household (dishes, doing bed, laundry) 0 0 0 0 0 1  Shopping 0 0 0 0 0 0  Walking level at own pace 0 0 0 0 0 0  Walking up Stairs _0 Total (40 - 48) Dyspnea Score _1 How bad is your cough? 0 0 0 0 0 0  How bad is your fatigue 1 due to esbriet 0 2 0 2 1  nausea   1 0 1 1  vomit   0 0 0 0  diarrhea   _2 anxiety   0 0 0 1  depresso0   0 0 0 1        Simple office  walk 185 feet x  3 laps goal with forehead probe 12/07/2017 Weight 180# 03/08/2018 Weight 174.9 05/10/2018 Weight 174# 08/23/2018 Weight 178# 04/21/2019 Weight 170# 10/16/2019 173# 04/11/2020 168# 10/22/2020 170# 04/03/2021 168#  O2 used _3  Room air   ra  Number laps completed _4 Comments about pace Moderate pace Mod pace Mod fast pace fast  fast   fast  Resting Pulse Ox/HR 100% and 83/min 100% ad 85/min 100% and 91/min 100% and 82/min  100% asnd 91 100% and 83/min 98% and 94/min 100% and 82  Final Pulse Ox/HR 99% and 98/min 98$ and 104/min 98% and 108/min 98% and 100/min  96% and 106/mom 98% and 96/mn 95% and 110.min 97% and 105  Desaturated </= 88% no no  no no  no     Desaturated <= 3% points no no ni no  yesm 4 points  Yes, 3 points Yes, 3 poitn  Got Tachycardic >/= 90/min yes yes yes yes  yes  yes yes  Symptoms at end of test No complaints No complaints  No complaints  Mild dyspnea  no Mild dyspnea  Miscellaneous comments x ? sligh increase in tachy/stable ? incraeased tachty          PFT  PFT Results Latest Ref Rng & Units 04/03/2021 10/21/2020 04/11/2020 10/04/2019 04/21/2019 08/23/2018 05/10/2018  FVC-Pre L 2.96 3.20 3.26 3.35 3.59 3.19 3.27  FVC-Predicted Pre % 75 81 82 83 90 82 84  Pre FEV1/FVC % % 81 81 80 82 81 83 82  FEV1-Pre L 2.38 2.59 2.62 2.74 2.92 2.64 2.67  FEV1-Predicted Pre % 85 91 92 95 102 94 96  DLCO uncorrected ml/min/mmHg 16.15 13.64 17.26 16.69 20.89 18.61 19.04  DLCO UNC% % 67 56 72 69 86 79 64  DLCO corrected ml/min/mmHg 16.15 13.64 17.26 17.31 - - -  DLCO COR %Predicted % 67 56 72 71 - - -  DLVA Predicted % 91 80 94 91 109 100 88    CLINICAL DATA:  Pulmonary fibrosis.   EXAM: CT CHEST WITHOUT CONTRAST   TECHNIQUE: Multidetector CT imaging of the chest was performed following the standard protocol without intravenous contrast. High resolution imaging of the lungs, as well as inspiratory and expiratory  imaging, was performed.   COMPARISON:  03/28/2019, 03/30/2018 and 02/27/2017. Bone scan 05/21/2020.   FINDINGS: Cardiovascular: Atherosclerotic calcification of the aorta, aortic valve and coronary arteries. Enlarged pulmonic trunk and right and left main pulmonary arteries. Heart is at the upper limits of normal in size. No pericardial effusion.   Mediastinum/Nodes: Low left internal jugular lymph nodes measure up to 8 mm (2/8), previously 3 mm on 03/28/2019. Mediastinal lymph nodes are subcentimeter in short axis size, as before. Hilar regions are difficult to definitively evaluate without IV contrast. Left axillary lymph nodes measure up to 9 mm, are unchanged and not considered enlarged by CT size criteria. High left subpectoral lymph node measures 7 mm (2/11), increased from 4 mm on 03/28/2019. No right axillary adenopathy. Esophagus is grossly unremarkable. Periesophageal lymph nodes are subcentimeter in short axis size and appears similar.   Lungs/Pleura: Peripheral and basilar predominant subpleural reticulation, ground-glass and traction bronchiectasis/bronchiolectasis, minimally progressive from 03/28/2019. Evidence of progression is best seen in the lower lung zones. No pleural fluid. Airway is unremarkable. No air trapping.   Upper Abdomen: Visualized portions of the liver, gallbladder, adrenal glands, kidneys, spleen, pancreas, stomach and bowel are unremarkable.   Musculoskeletal: Degenerative changes in the spine. There is a new small rounded sclerotic lesion in the lateral aspect of the right third rib (sagittal image 32). New lucent lesion in the left posterolateral aspect of the T8 vertebral body measuring approximately 1.3 x 1.7 x 2.1 cm (2/84 and sagittal image 96). This likely corresponds to abnormal uptake seen on bone scan 05/21/2020. Finding is new from 03/28/2019. Slight compression of the T12 superior endplate is new.   IMPRESSION: 1. Slight  interval progression of pulmonary fibrosis. Findings are consistent with UIP per consensus guidelines: Diagnosis of Idiopathic Pulmonary Fibrosis: An Official ATS/ERS/JRS/ALAT Clinical Practice Guideline. [place], Iss 5, [phone], Mar 06 2017. 2. New small sclerotic lesion in the lateral aspect of the right third rib, worrisome for metastatic prostate cancer. 3.  Lucent lesion in the left posterolateral aspect of the T8 vertebral body corresponds to abnormal uptake on bone scan 05/21/2020, and is new from CT chest 03/28/2019, raising suspicion for malignancy. 4. Left internal and high left subpectoral lymph nodes are subcentimeter in size but appear slightly larger than on 03/28/2019. Difficult to exclude a lymphoproliferative disorder. Please refer to CT neck with contrast done the same day in further evaluation. 5. Slight compression of the T12 superior endplate is new but age indeterminate. 6. Aortic atherosclerosis (ICD10-I70.0). Coronary artery calcification. 7. Enlarged pulmonary arteries, indicative of pulmonary arterial hypertension.     Electronically Signed   By: [name]   On: 03/19/2021 15:12   Xxxxx  IMPRESSION: Neck CT September 2022 Mildly prominent lymph nodes in the neck bilaterally left greater than right. Cluster of left supraclavicular and left posterior lymph nodes all under 1 cm.   Largest lymph node in the neck is a right level 3 lymph node measuring 12 mm on axial image 75.     Electronically Signed   By: Franchot Gallo M.D.   On: 03/19/2021 15:24   Xxxx IMPRESSIONS echocardiogram September 2022    1. Left ventricular ejection fraction, by estimation, is 65 to 70%. The  left ventricle has normal function. The left ventricle has no regional  wall motion abnormalities. Left ventricular diastolic parameters are  consistent with Grade I diastolic  dysfunction (impaired relaxation).   2. Right ventricular  systolic function is normal. The right ventricular  size is normal. Tricuspid regurgitation signal is inadequate for assessing  PA pressure.   3. Left atrial size was mildly dilated.   4. The mitral valve is abnormal. Trivial mitral valve regurgitation. No  evidence of mitral stenosis.   5. The aortic valve is tricuspid. There is mild calcification of the  aortic valve. There is moderate thickening of the aortic valve. Aortic  valve regurgitation is not visualized. Mild aortic valve stenosis. Aortic  valve area, by VTI measures 1.62 cm.  Aortic valve mean gradient measures 12.0 mmHg. Aortic valve Vmax measures  2.31 m/s. DI is 0.43.   6. Aortic dilatation noted. There is borderline dilatation of the aortic  root, measuring 39 mm. There is borderline dilatation of the ascending  aorta, measuring 39 mm.   7. The inferior vena cava is normal in size with <50% respiratory  variability, suggesting right atrial pressure of 8 mmHg.   Comparison(s): Changes from prior study are noted. 03/02/2017: LVEF 60-65%,  mild LVH, grade 1 DD, aortic root 39 mm, no aortic stenosis.   has a past medical history of Arthritis, Diverticulosis of colon (without mention of hemorrhage), GERD (gastroesophageal reflux disease), Hiatal hernia, HLD (hyperlipidemia), Internal hemorrhoids without mention of complication, Irritable bowel syndrome, Other specified gastritis without mention of hemorrhage, Prostate cancer ([place]), Pulmonary fibrosis ([place]), and Stress fracture (02/2021).   reports that he has never smoked. He has never used smokeless tobacco.  Past Surgical History:  Procedure Laterality Date   CARPAL TUNNEL RELEASE     left   CATARACT EXTRACTION  06/2011   bilateral   DEEP NECK LYMPH NODE BIOPSY / EXCISION  03/25/2021   INGUINAL HERNIA REPAIR Right 09/05/2019   Procedure: OPEN REPAIR RIGHT INGUINAL HERNIA WITH MESH;  Surgeon: Armandina Gemma, MD;  Location: [facility];  Service: General;  Laterality: Right;    [place]   x 2   LUMBAR LAMINECTOMY/DECOMPRESSION MICRODISCECTOMY Left 07/11/2013   Procedure: LUMBAR ONE TO  TWO, LUMBAR TWO TO THREE, LUMBAR THREE TO FOUR LUMBAR LAMINECTOMY/DECOMPRESSION MICRODISCECTOMY 3 LEVELS;  Surgeon: Charlie Pitter, MD;  Location: [facility];  Service: Neurosurgery;  Laterality: Left;   NASAL SINUS SURGERY      No Active Allergies  Immunization History  Administered Date(s) Administered   Fluad Quad(high Dose 65+) 03/18/2020   Influenza Split 10/03/2009, 03/28/2010, 04/14/2011, 03/22/2012, 03/09/2013, 03/28/2014   Influenza, High Dose Seasonal PF 03/19/2016, 03/06/2017, 04/09/2018, 03/07/2019, 04/06/2020   Influenza, Quadrivalent, Recombinant, Inj, Pf 03/12/2018, 03/10/2019, 04/06/2020   Influenza,inj,Quad PF,6+ Mos 04/04/2014, 03/26/2015, 04/05/2016   Influenza-Unspecified 04/05/2016   PFIZER(Purple Top)SARS-COV-2 Vaccination 07/20/2019, 08/10/2019, 02/27/2020, 10/15/2020   Pneumococcal Conjugate-13 03/30/2013, 07/31/2013, 04/26/2018   Pneumococcal Polysaccharide-23 10/03/2009, 01/14/2010, 03/31/2018   Pneumococcal-Unspecified 04/05/2016   Td 10/20/2016   Tdap 10/03/2009   Zoster Recombinat (Shingrix) 01/03/2017   Zoster, Live 10/03/2009    Family History  Problem Relation Age of Onset   Heart failure Father 35   Bladder Cancer Mother    Breast cancer Mother    Colon cancer Neg Hx    Pancreatic cancer Neg Hx    Rectal cancer Neg Hx    Stomach cancer Neg Hx    Prostate cancer Neg Hx      Current Outpatient Medications:    acetaminophen-codeine (TYLENOL #3) 300-30 MG per tablet, Take 1 tablet by mouth every 8 (eight) hours as needed for moderate pain., Disp: , Rfl:    aspirin EC 81 MG tablet, Take 81 mg by mouth 3 (three) times a week., Disp: , Rfl:    carisoprodol (SOMA) 350 MG tablet, Take 175 mg by mouth 3 (three) times daily as needed for muscle spasms., Disp: , Rfl:    Cholecalciferol (VITAMIN D-3) 1000 UNITS CAPS, Take  2,000 Units by mouth every evening., Disp: , Rfl:    CO-ENZYME Q-10 PO, Take 1 tablet by mouth every evening. , Disp: , Rfl:    Cyanocobalamin (VITAMIN B-12 IJ), Inject as directed., Disp: , Rfl:    Cyanocobalamin (VITAMIN B12) 1000 MCG TBCR, 1 tablet, Disp: , Rfl:    denosumab (PROLIA) 60 MG/ML SOSY injection, Inject 60 mg into the skin every 6 (six) months., Disp: , Rfl:    diclofenac (VOLTAREN) 75 MG EC tablet, Take 75 mg by mouth 2 (two) times daily as needed (pain.)., Disp: , Rfl: 2   diclofenac sodium (VOLTAREN) 1 % GEL, Apply 4 g topically 4 (four) times daily as needed (pain.)., Disp: , Rfl: 12   dicyclomine (BENTYL) 10 MG capsule, Take one by mouth every 6 hours as needed for abdominal cramping (Patient taking differently: Take 10 mg by mouth every 6 (six) hours as needed (abdominal spasms.).), Disp: 30 capsule, Rfl: 1   diphenoxylate-atropine (LOMOTIL) 2.5-0.025 MG tablet, Take 1 tablet by mouth 4 (four) times daily as needed for diarrhea or loose stools., Disp: , Rfl:    ESBRIET 801 MG TABS, Take 1 tablet by mouth 3 (three) times daily with meals., Disp: 180 tablet, Rfl: 1   esomeprazole (NEXIUM) 40 MG capsule, Take 40 mg by mouth every evening. , Disp: , Rfl:    ezetimibe (ZETIA) 10 MG tablet, Take 10 mg by mouth daily., Disp: , Rfl:    Fluticasone Propionate (XHANCE) 93 MCG/ACT EXHU, 1 spray in each nostril, Disp: , Rfl:    Glucosamine Sulfate 500 MG TABS, Take 500 mg by mouth daily., Disp: , Rfl:    HYDROcodone-acetaminophen (NORCO/VICODIN) 5-325 MG tablet, Take 1-2 tablets by mouth every 4 (four)  hours as needed for moderate pain., Disp: 24 tablet, Rfl: 0   loratadine (CLARITIN) 10 MG tablet, Take 10 mg by mouth daily., Disp: , Rfl:    meclizine (ANTIVERT) 25 MG tablet, Take 25 mg by mouth 3 (three) times daily as needed for dizziness. , Disp: , Rfl:    methocarbamol (ROBAXIN) 500 MG tablet, take 1 tab 4 times daily prn spasm, Disp: , Rfl:    Multiple Vitamin (MULTIVITAMIN) tablet,  Take 1 tablet by mouth daily., Disp: , Rfl:    ondansetron (ZOFRAN-ODT) 8 MG disintegrating tablet, Take 8 mg by mouth every 6 (six) hours as needed for nausea/vomiting., Disp: , Rfl:       Objective:   Vitals:   04/03/21 0936  BP: 118/60  Pulse: 88  Temp: (!) 97.5 F (36.4 C)  SpO2: 100%  Weight: 168 lb 3.2 oz (76.3 kg)  Height: _0  (1.753 m)    Estimated body mass index is 24.84 kg/m as calculated from the following:   Height as of this encounter: _1  (1.753 m).   Weight as of this encounter: 168 lb 3.2 oz (76.3 kg).  _2 @  [facility] Weights   04/03/21 0936  Weight: 168 lb 3.2 oz (76.3 kg)     Physical Exam    General: No distress. Looks well Neuro: Alert and Oriented x 3. GCS 15. Speech normal Psych: Pleasant Resp:  Barrel Chest - no.  Wheeze - n, Crackles - just basal crackles, No overt respiratory distress CVS: Normal heart sounds. Murmurs - no Ext: Stigmata of Connective Tissue Disease - no HEENT: Normal upper airway. PEERL +. No post nasal drip        Assessment:       ICD-10-CM   1. 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[place])  J84.112     2. Encounter for therapeutic drug monitoring  Z51.81     3. Research subject  Z00.6     4. 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 Drug-induced weight loss  R63.4    T50.905A     6. Chronic fatigue  R53.82     7. Irritable bowel syndrome, unspecified type  K58.9     8. Cervical lymphadenopathy  R59.0          Plan:     Patient Instructions     ICD-10-CM   1. 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[place])  J84.112     2. Encounter for therapeutic drug monitoring  Z51.81     3. Research subject  Z00.6     4. 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 Drug-induced weight loss  R63.4    T50.905A     6. Chronic fatigue  R53.82     7. Irritable bowel syndrome, unspecified type  K58.9     8. Cervical lymphadenopathy  R59.0       IPF (idiopathic pulmonary fibrosis)  ([place]) Encounter for therapeutic drug monitoring Research subject - IPF PRO registry  - somewhat progressive since sept 2020 and ? Progressive since spring 2022 - possibly but on symptom score and walking desaturation test it appears he is stable this particular year 2022  Plan -Check liver function test today  -Keep pulse ox greater than 88% at all times - Continue pirfenidone - Do not qualify for new interventional clinical trials given recent history of prostate cancer -We will inform the research team about your next visit for the research registry -Test overnight oxygen on room  air -On any trips to [place] monitor oxygen very closely and ensure it is over 88% at all times -Get high-dose flu shot and COVID by Valent mRNA booster vaccine when possible   Enlarged pulmonary artery (HCC) 0 seen on CT scan September 2022  -No evidence of pulmonary hypertension on echocardiogram in 2022  Plan - Clinically monitor  Drug-induced weight loss -from pirfenidone  -Currently stable since last few visits and this is good news  Plan - Clinically monitor  Chronic fatigue Irritable bowel syndrome, unspecified type  -This appears multifactorial and related to prostate cancer treatment, IPF, pirfenidone and also recent radiation for the prostate -Possible the undiagnosed issue of the cervical lymphadenopathy is playing a role -Also possible lack of ability to exercise significantly because of lower spine fracture is playing a role  Plan  - Continue judicious exercises -Address stress reaction to medical issues through guided meditation  Cervical lymphadenopathy - new  Plan -Await pending biopsy results by [name]  Follow-up - 30-minute face-to-face visit in 6 weeks with Dr. [name] or sooner if needed  -ILD symptom score and walking desaturation test at follow-up  -Seeing you sooner because of multiple issues that are ongoing with your health  -    ( Level 05 visit: Estb  40-54 min   in  visit type: on-site physical face to visit  in total care time and counseling or/and coordination of care by this undersigned MD - Dr Brand Males. This includes one or more of the following on this same day 04/03/2021: pre-charting, chart review, note writing, documentation discussion of test results, diagnostic or treatment recommendations, prognosis, risks and benefits of management options, instructions, education, compliance or risk-factor reduction. It excludes time spent by the [place] or office staff in the care of the patient. Actual time 40 min)   SIGNATURE    Dr. Brand Males, M.D., F.C.C.P,  Pulmonary and Critical Care Medicine Staff Physician, [place] Director - Interstitial Lung Disease  Program  P[facility] [place], [place], [address]  Pager: [phone], If no answer or between  15:00h - 7:00h: call [phone] Telephone: [phone]  10:29 AM 04/03/2021

## 2021-04-03 NOTE — Progress Notes (Signed)
Spirometry and Dlco done today. 

## 2021-04-03 NOTE — Patient Instructions (Addendum)
ICD-10-CM   1. 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[place])  J84.112     2. Encounter for therapeutic drug monitoring  Z51.81     3. Research subject  Z00.6     4. 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 Drug-induced weight loss  R63.4    T50.905A     6. Chronic fatigue  R53.82     7. Irritable bowel syndrome, unspecified type  K58.9     8. Cervical lymphadenopathy  R59.0       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[place]) Encounter for therapeutic drug monitoring Research subject - IPF PRO registry  - somewhat progressive since sept 2020 and ? Progressive since spring 2022 - possibly but on symptom score and walking desaturation test it appears he is stable this particular year 2022  Plan -Check liver function test today  -Keep pulse ox greater than 88% at all times - Continue pirfenidone but change to genric. Will inform [facility] - Do not qualify for new interventional clinical trials given recent history of prostate cancer -We will inform the research team about your next visit for the research registry -Test overnight oxygen on room air -On any trips to mountains monitor oxygen very closely and ensure it is over 88% at all times -Get high-dose flu shot and COVID by Valent mRNA booster vaccine when possible   Enlarged pulmonary artery ([place]) 0 seen on CT scan September 2022  -No evidence of pulmonary hypertension on echocardiogram in 2022  Plan - Clinically monitor  Drug-induced weight loss -from pirfenidone  -Currently stable since last few visits and this is good news  Plan - Clinically monitor  Chronic fatigue Irritable bowel syndrome, unspecified type  -This appears multifactorial and related to prostate cancer treatment, IPF, pirfenidone and also recent radiation for the prostate -Possible the undiagnosed issue of the cervical lymphadenopathy is playing a role -Also possible lack of ability to exercise significantly because of lower spine fracture is  playing a role  Plan  - Continue judicious exercises -Address stress reaction to medical issues through guided meditation  Cervical lymphadenopathy - new  Plan -Await pending biopsy results by [name]  Follow-up - 30-minute face-to-face visit in 6 weeks with Dr. [name] or sooner if needed  -ILD symptom score and walking desaturation test at follow-up  -Seeing you sooner because of multiple issues that are ongoing with your health  -

## 2021-04-03 NOTE — Telephone Encounter (Signed)
Ran test claim, plan paid for generic Esbriet- Pirfenidone. Copay was $437.38 for 1 month supply.   Ran test claim for Brand, plan required DAW entered to process (Generic is preferred)- copay is $509.12.

## 2021-04-04 MED ORDER — PIRFENIDONE 801 MG PO TABS: 1.0000 | ORAL_TABLET | Freq: Three times a day (TID) | ORAL | 1 refills | Status: DC

## 2021-04-07 DIAGNOSIS — R590 Localized enlarged lymph nodes: Secondary | ICD-10-CM | POA: Diagnosis not present

## 2021-04-07 DIAGNOSIS — C8511 Unspecified B-cell lymphoma, lymph nodes of head, face, and neck: Secondary | ICD-10-CM | POA: Diagnosis not present

## 2021-04-07 DIAGNOSIS — R591 Generalized enlarged lymph nodes: Secondary | ICD-10-CM | POA: Diagnosis not present

## 2021-04-08 ENCOUNTER — Other Ambulatory Visit: Payer: Self-pay | Admitting: Oncology

## 2021-04-08 DIAGNOSIS — E538 Deficiency of other specified B group vitamins: Secondary | ICD-10-CM | POA: Diagnosis not present

## 2021-04-10 ENCOUNTER — Telehealth: Payer: Self-pay | Admitting: Internal Medicine

## 2021-04-10 DIAGNOSIS — M21372 Foot drop, left foot: Secondary | ICD-10-CM | POA: Diagnosis not present

## 2021-04-10 DIAGNOSIS — M545 Low back pain, unspecified: Secondary | ICD-10-CM | POA: Diagnosis not present

## 2021-04-10 NOTE — Telephone Encounter (Signed)
Hey Dr. Henrene Pastor,   Patient called in to cancel procedure for endo/colon for 10/7 due having an emergency neuro surgery that is happening today 10/6. States he will call back to reschedule procedure.   Thank you

## 2021-04-10 NOTE — Telephone Encounter (Signed)
Noted! Thank you

## 2021-04-11 ENCOUNTER — Other Ambulatory Visit (HOSPITAL_COMMUNITY): Payer: Self-pay

## 2021-04-11 ENCOUNTER — Encounter: Payer: PPO | Admitting: Internal Medicine

## 2021-04-11 DIAGNOSIS — M4726 Other spondylosis with radiculopathy, lumbar region: Secondary | ICD-10-CM | POA: Diagnosis not present

## 2021-04-11 MED ORDER — PIRFENIDONE 801 MG PO TABS: 1.0000 | ORAL_TABLET | Freq: Three times a day (TID) | ORAL | 1 refills | Status: DC

## 2021-04-11 NOTE — Addendum Note (Signed)
Addended by: Cassandria Anger on: 04/11/2021 12:43 PM   Modules accepted: Orders

## 2021-04-11 NOTE — Telephone Encounter (Addendum)
Received call from patient stating that he is having difficulty filling generic Esbriet. He states he has 4 or 5 days worth of medication remaining at home. He states he scheduled shipment of med on 04/09/21 to arrive to his home today, 04/11/21  Provided verbal rx to pharmacist, Sharol Given, for 90 days supply  Conference called patient who had to provide consent since copay increased to $800 for 60 day supply.  Shipment of pirfenidone is scheduled to be received by patient on 04/12/21.  Knox Saliva, PharmD, MPH, BCPS Clinical Pharmacist (Rheumatology and Pulmonology)

## 2021-04-17 DIAGNOSIS — M21372 Foot drop, left foot: Secondary | ICD-10-CM | POA: Diagnosis not present

## 2021-04-21 ENCOUNTER — Telehealth: Payer: Self-pay | Admitting: *Deleted

## 2021-04-21 ENCOUNTER — Encounter: Payer: Self-pay | Admitting: *Deleted

## 2021-04-21 NOTE — Telephone Encounter (Signed)
Dr. Annamaria Boots called to request new patient appointment with Dr. [name]. Per [name], he has new diagnosis of lymphoma. Nurse navigator is already aware.

## 2021-04-21 NOTE — Progress Notes (Signed)
Referral from Dr Redmond Baseman received, chart under review for appt scheduling

## 2021-04-22 ENCOUNTER — Encounter: Payer: Self-pay | Admitting: *Deleted

## 2021-04-22 NOTE — Progress Notes (Signed)
PATIENT NAVIGATOR PROGRESS NOTE  Name: Kenneth [name] Date: 04/22/2021 MRN: [id number]  DOB: 17-May-1941   Reason for visit:  New Patient appt call  Comments:  Spoke with Dr Annamaria Boots and gave him New Pt appt for 10/19 at 1:40pm with Dr [name], directions to building and parking as well as mask and one support person allowed policy reviewed. Appreciated call    Time spent counseling/coordinating care: < 15 minutes

## 2021-04-23 ENCOUNTER — Inpatient Hospital Stay: Payer: PPO | Attending: Oncology | Admitting: Oncology

## 2021-04-23 ENCOUNTER — Other Ambulatory Visit: Payer: Self-pay

## 2021-04-23 VITALS — BP 133/71 | HR 96 | Temp 98.1°F | Resp 18 | Ht 69.0 in | Wt 167.6 lb

## 2021-04-23 DIAGNOSIS — Z79890 Hormone replacement therapy (postmenopausal): Secondary | ICD-10-CM | POA: Diagnosis not present

## 2021-04-23 DIAGNOSIS — S32000A Wedge compression fracture of unspecified lumbar vertebra, initial encounter for closed fracture: Secondary | ICD-10-CM

## 2021-04-23 DIAGNOSIS — C61 Malignant neoplasm of prostate: Secondary | ICD-10-CM | POA: Diagnosis not present

## 2021-04-23 DIAGNOSIS — K589 Irritable bowel syndrome without diarrhea: Secondary | ICD-10-CM | POA: Insufficient documentation

## 2021-04-23 DIAGNOSIS — R590 Localized enlarged lymph nodes: Secondary | ICD-10-CM | POA: Insufficient documentation

## 2021-04-23 DIAGNOSIS — R591 Generalized enlarged lymph nodes: Secondary | ICD-10-CM | POA: Diagnosis not present

## 2021-04-23 DIAGNOSIS — M545 Low back pain, unspecified: Secondary | ICD-10-CM | POA: Insufficient documentation

## 2021-04-23 DIAGNOSIS — C9110 Chronic lymphocytic leukemia of B-cell type not having achieved remission: Secondary | ICD-10-CM | POA: Diagnosis not present

## 2021-04-23 DIAGNOSIS — Z8546 Personal history of malignant neoplasm of prostate: Secondary | ICD-10-CM | POA: Insufficient documentation

## 2021-04-23 DIAGNOSIS — J84112 Idiopathic pulmonary fibrosis: Secondary | ICD-10-CM | POA: Insufficient documentation

## 2021-04-23 DIAGNOSIS — R0600 Dyspnea, unspecified: Secondary | ICD-10-CM | POA: Insufficient documentation

## 2021-04-23 DIAGNOSIS — Z79899 Other long term (current) drug therapy: Secondary | ICD-10-CM | POA: Insufficient documentation

## 2021-04-23 NOTE — Progress Notes (Signed)
Kenneth [name] New Patient Consult   Requesting MD: Kenneth [name], [name],  [name] 80 y.o.  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 Chase [name] for idiopathic pulmonary fibrosis diagnosed in 2018.  He is treated with pirfenidone and reports weight loss and malaise since being maintained on pirfenidone.  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  He underwent treatment with external beam radiation 09/12/2020 - 11/07/2020 and is now maintained on every 68-monthEligard.  He developed malaise during the course of radiation.  This has improved.  Dr. YAnnamaria [name] small lymph nodes in the left lower neck on Labor Day.  He was scheduled for a follow-up chest CT for surveillance of pulmonary fibrosis.  A CT neck was added.  The neck CT revealed mildly prominent bilateral neck nodes on the left greater than right, the largest node in the neck is a right level 3 node measuring 12 mm.  The CT chest on 03/19/2021 revealed slight progression of pulmonary fibrosis, a new small sclerotic lesion in the right third rib, a lucent lesion at T8 responding to abnormal uptake on a bone scan in November 2021 suspicious for malignancy.  Subpectoral nodes are less than a centimeter, but larger than in September 2020.  He was referred to Dr. BRedmond Basemanunderwent an FNA biopsy of a left neck node on 03/25/2021.  The cytology revealed an atypical lymphoid proliferation.  Dr. BRedmond Basemanperformed an excisional biopsy of a left level 5 node on 04/07/2021.  Flow cytometry revealed a degenerated sample with a CD10 positive monocytic B-cell population with kappa light chain restriction.  The lymph node pathology revealed marked autolysis with an atypical lymphoid infiltrate.  The cells are CD20 positive with nodular areas expressing CD10 and BCL6.  Bcl-2 is positive with strong staining in  the nodular areas.  Cyclin D1 is negative.  The Ki67 proliferation rate is increased in the nodular areas, 60 to 70%, with overall 30% proliferation rate.  The findings are felt to be consistent with a CD10 positive B-cell lymphoma concerning for an aggressive process, but poor fixation artifact precluded a definitive classification.  He reports developing lower back pain when he returned to playing golf.  An MRI of the lumbar spine on 03/07/2021 revealed a superior endplate fracture at L5 with bone marrow edema consistent with a recent fracture.        Past Medical History:  Diagnosis Date   Arthritis    Diverticulosis of colon (without mention of hemorrhage)    GERD (gastroesophageal reflux disease)    Hiatal hernia    HLD (hyperlipidemia)    Internal hemorrhoids without mention of complication    Irritable bowel syndrome    Other specified gastritis without mention of hemorrhage    Prostate cancer (Kenneth [name] 2022, T3, Gleason score 5+4, XRT 09/12/2020 - 11/07/2020    [place] ([place]    Stress fracture 02/2021    Past Surgical History:  Procedure Laterality Date   CARPAL TUNNEL RELEASE     left   CATARACT EXTRACTION  06/2011   bilateral   DEEP NECK LYMPH NODE BIOPSY / EXCISION  03/25/2021   INGUINAL HERNIA REPAIR Right 09/05/2019   Procedure: OPEN REPAIR RIGHT INGUINAL HERNIA WITH MESH;  Surgeon: Kenneth Gemma MD;  Location: [facility];  Service: General;  Laterality: Right;   LUMBAR [place]  1986, 1995   x 2   LUMBAR LAMINECTOMY/DECOMPRESSION MICRODISCECTOMY Left 07/11/2013   Procedure: LUMBAR ONE TO TWO, LUMBAR TWO TO THREE, LUMBAR THREE TO FOUR LUMBAR LAMINECTOMY/DECOMPRESSION MICRODISCECTOMY 3 LEVELS;  Surgeon: Kenneth Pitter, MD;  Location: [facility];  Service: Neurosurgery;  Laterality: Left;   NASAL SINUS SURGERY      Medications: Reviewed  Allergies: No Known Allergies  Family history: No family history of cancer  Social History:   He lives with his wife in  [place].  He is retired Stage manager.  He does not use cigarettes or alcohol.  No transfusion history.  No risk factor for HIV or hepatitis.  He has received COVID-19 vaccines.  ROS:   Positives include: Hot flashes, decreased appetite since beginning treatment with pirfenidone, recent episode of left leg numbness and foot drop-resolved, mild exertional dyspnea, low back pain  A complete ROS was otherwise negative.  Physical Exam:  Blood pressure 133/71, pulse 96, temperature 98.1 F (36.7 C), temperature source Oral, resp. rate 18, height _0  (1.753 m), weight 167 lb 9.6 oz (76 kg), SpO2 100 %.  HEENT: Neck without mass Lungs: Inspiratory rales at the right greater than left lower posterior chest, no respiratory distress Cardiac: Regular rate and rhythm Abdomen: No hepatosplenomegaly, no mass, nontender  Vascular: No leg edema Lymph nodes: Less than 1 cm low left cervical nodes superior to the left neck biopsy site, no axillary, supraclavicular, or inguinal nodes Neurologic: The motor exam appears intact in the upper and lower extremities bilaterally, normal strength with dorsi flexion at the feet Skin: No rash Musculoskeletal: No spine tenderness   LAB:  CBC  Lab Results  Component Value Date   WBC 4.1 09/01/2019   HGB 13.4 09/01/2019   HCT 37.9 (L) 09/01/2019   MCV 93.8 09/01/2019   PLT 145 (L) 09/01/2019   NEUTROABS 2.3 10/14/2013        CMP  Lab Results  Component Value Date   NA 135 04/11/2020   K 4.6 04/11/2020   CL 98 04/11/2020   CO2 33 (H) 04/11/2020   GLUCOSE 85 04/11/2020   BUN 15 04/11/2020   CREATININE 0.75 04/11/2020   CALCIUM 9.5 04/11/2020   PROT 6.9 04/03/2021   ALBUMIN 4.2 04/03/2021   AST 20 04/03/2021   ALT 17 04/03/2021   ALKPHOS 81 04/03/2021   BILITOT 0.4 04/03/2021   GFRNONAA >60 06/01/2017   GFRAA >60 06/01/2017     No results found for: CEA1, VUY233, CA125  Imaging:  CT neck and chest images from September 2022 reviewed  with [name]    Assessment/Plan:   Mildly enlarged neck-chest lymph nodes CTs neck and chest 03/19/2021-cluster of left supraclavicular nodes measuring less than 1 cm, 12 mm right node next to the jugular vein, chest with slight interval progression of pulmonary fibrosis, new small sclerotic lesion in the right third rib concerning for metastasis, lucent lesion in T8 corresponding to abnormal uptake on a bone scan 05/21/2020 suspicious for malignancy, subpectoral nodes-subcentimeter in size but larger than 03/28/2019 FNA of left neck node 03/25/2021-atypical lymphoid proliferation Excisional biopsy of level 5 left neck node 04/07/2021-lymph node with marked autolysis and diffusely infiltrated by atypical lymphocytes, 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  He noted small lymph nodes in the left lower neck in early September.  CT imaging confirmed small nodes in the bilateral neck and chest.  An FNA biopsy and excisional biopsy of a left neck lymph node confirmed a B cell lymphocytic perforation.  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  The FNA and excisional biopsies had evidence of cell degeneration.  He does not have symptoms to suggest high-grade lymphoma.  He may have a low-grade non-Hodgkin's lymphoma unrelated to his current  symptoms.  We reviewed the CT neck and chest images.  The right rib lesion and T-spine lesions are nonspecific.  These could be related to lymphoma or prostate cancer.  However he is currently undergoing androgen deprivation therapy for prostate cancer after completing radiation.  He reports his PSA is undetectable.  We decided to obtain additional laboratory evaluation today to include a CBC, chemistry panel, LDH, and immunofixation electrophoresis.  I will request the pathology slides from [facility] for further review here.  We will consider a repeat excisional biopsy versus a PET scan depending on the hematopathology review here.  [name] plans a trip to [place] later this month.  He will return for an office visit and further discussion on 05/20/2021.  [name], MD  04/23/2021, 7:45 PM

## 2021-04-24 ENCOUNTER — Encounter: Payer: Self-pay | Admitting: *Deleted

## 2021-04-24 ENCOUNTER — Inpatient Hospital Stay: Payer: PPO

## 2021-04-24 DIAGNOSIS — Z8546 Personal history of malignant neoplasm of prostate: Secondary | ICD-10-CM | POA: Diagnosis not present

## 2021-04-24 DIAGNOSIS — R0600 Dyspnea, unspecified: Secondary | ICD-10-CM | POA: Diagnosis not present

## 2021-04-24 DIAGNOSIS — J84112 Idiopathic pulmonary fibrosis: Secondary | ICD-10-CM | POA: Diagnosis not present

## 2021-04-24 DIAGNOSIS — Z79899 Other long term (current) drug therapy: Secondary | ICD-10-CM | POA: Diagnosis not present

## 2021-04-24 DIAGNOSIS — R590 Localized enlarged lymph nodes: Secondary | ICD-10-CM | POA: Diagnosis not present

## 2021-04-24 DIAGNOSIS — K589 Irritable bowel syndrome without diarrhea: Secondary | ICD-10-CM | POA: Diagnosis not present

## 2021-04-24 DIAGNOSIS — M545 Low back pain, unspecified: Secondary | ICD-10-CM | POA: Diagnosis not present

## 2021-04-24 DIAGNOSIS — R591 Generalized enlarged lymph nodes: Secondary | ICD-10-CM

## 2021-04-24 LAB — CBC WITH DIFFERENTIAL (CANCER CENTER ONLY)
Abs Immature Granulocytes: 0.01 10*3/uL (ref 0.00–0.07)
Basophils Absolute: 0 10*3/uL (ref 0.0–0.1)
Basophils Relative: 0 %
Eosinophils Absolute: 0.2 10*3/uL (ref 0.0–0.5)
Eosinophils Relative: 4 %
HCT: 33.6 % — ABNORMAL LOW (ref 39.0–52.0)
Hemoglobin: 11.6 g/dL — ABNORMAL LOW (ref 13.0–17.0)
Immature Granulocytes: 0 %
Lymphocytes Relative: 14 %
Lymphs Abs: 0.6 10*3/uL — ABNORMAL LOW (ref 0.7–4.0)
MCH: 32 pg (ref 26.0–34.0)
MCHC: 34.5 g/dL (ref 30.0–36.0)
MCV: 92.6 fL (ref 80.0–100.0)
Monocytes Absolute: 0.5 10*3/uL (ref 0.1–1.0)
Monocytes Relative: 10 %
Neutro Abs: 3.1 10*3/uL (ref 1.7–7.7)
Neutrophils Relative %: 72 %
Platelet Count: 184 10*3/uL (ref 150–400)
RBC: 3.63 MIL/uL — ABNORMAL LOW (ref 4.22–5.81)
RDW: 12.6 % (ref 11.5–15.5)
WBC Count: 4.3 10*3/uL (ref 4.0–10.5)
nRBC: 0 % (ref 0.0–0.2)

## 2021-04-24 LAB — CMP (CANCER CENTER ONLY)
ALT: 17 U/L (ref 0–44)
AST: 18 U/L (ref 15–41)
Albumin: 4 g/dL (ref 3.5–5.0)
Alkaline Phosphatase: 71 U/L (ref 38–126)
Anion gap: 6 (ref 5–15)
BUN: 11 mg/dL (ref 8–23)
CO2: 28 mmol/L (ref 22–32)
Calcium: 8.2 mg/dL — ABNORMAL LOW (ref 8.9–10.3)
Chloride: 99 mmol/L (ref 98–111)
Creatinine: 0.59 mg/dL — ABNORMAL LOW (ref 0.61–1.24)
GFR, Estimated: >60 mL/min (ref >60)
Glucose, Bld: 125 mg/dL — ABNORMAL HIGH (ref 70–99)
Potassium: 4.4 mmol/L (ref 3.5–5.1)
Sodium: 133 mmol/L — ABNORMAL LOW (ref 135–145)
Total Bilirubin: 0.5 mg/dL (ref 0.3–1.2)
Total Protein: 6.5 g/dL (ref 6.5–8.1)

## 2021-04-24 LAB — LACTATE DEHYDROGENASE: LDH: 192 U/L (ref 98–192)

## 2021-04-24 NOTE — Progress Notes (Signed)
Request sent to [facility] for pathology slides and tissue blocks to be sent to [facility] Pathology for Dr Gari Crown to review. Informed [facility] pathology dept of request

## 2021-04-28 LAB — IMMUNOFIXATION ELECTROPHORESIS
IgA: 126 mg/dL (ref 61–437)
IgG (Immunoglobin G), Serum: 953 mg/dL (ref 603–1613)
IgM (Immunoglobulin M), Srm: 118 mg/dL (ref 15–143)
Total Protein ELP: 6.3 g/dL (ref 6.0–8.5)

## 2021-05-16 LAB — SURGICAL PATHOLOGY

## 2021-05-17 ENCOUNTER — Other Ambulatory Visit: Payer: Self-pay | Admitting: Oncology

## 2021-05-17 DIAGNOSIS — R591 Generalized enlarged lymph nodes: Secondary | ICD-10-CM

## 2021-05-19 ENCOUNTER — Ambulatory Visit: Payer: PPO | Admitting: Internal Medicine

## 2021-05-20 ENCOUNTER — Inpatient Hospital Stay: Payer: PPO | Attending: Oncology | Admitting: Oncology

## 2021-05-20 ENCOUNTER — Other Ambulatory Visit: Payer: Self-pay

## 2021-05-20 VITALS — BP 148/84 | HR 73 | Temp 98.7°F | Resp 20 | Ht 69.0 in | Wt 167.0 lb

## 2021-05-20 DIAGNOSIS — I6523 Occlusion and stenosis of bilateral carotid arteries: Secondary | ICD-10-CM | POA: Diagnosis not present

## 2021-05-20 DIAGNOSIS — K589 Irritable bowel syndrome without diarrhea: Secondary | ICD-10-CM | POA: Insufficient documentation

## 2021-05-20 DIAGNOSIS — J8410 Pulmonary fibrosis, unspecified: Secondary | ICD-10-CM | POA: Insufficient documentation

## 2021-05-20 DIAGNOSIS — K8020 Calculus of gallbladder without cholecystitis without obstruction: Secondary | ICD-10-CM | POA: Diagnosis not present

## 2021-05-20 DIAGNOSIS — R630 Anorexia: Secondary | ICD-10-CM | POA: Diagnosis not present

## 2021-05-20 DIAGNOSIS — C61 Malignant neoplasm of prostate: Secondary | ICD-10-CM | POA: Diagnosis present

## 2021-05-20 DIAGNOSIS — C8590 Non-Hodgkin lymphoma, unspecified, unspecified site: Secondary | ICD-10-CM | POA: Diagnosis not present

## 2021-05-20 DIAGNOSIS — I700 Atherosclerosis of aorta: Secondary | ICD-10-CM | POA: Insufficient documentation

## 2021-05-20 DIAGNOSIS — R591 Generalized enlarged lymph nodes: Secondary | ICD-10-CM

## 2021-05-20 DIAGNOSIS — Z79899 Other long term (current) drug therapy: Secondary | ICD-10-CM | POA: Diagnosis not present

## 2021-05-20 NOTE — Progress Notes (Signed)
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  He returned from a trip to [place] last weekend.  No fever or night sweats.  He has intermittent nausea since starting medication for treatment of pulmonary fibrosis.  He complains of "irritable bowel syndrome ".  No change in the palpable neck nodes.  Objective:  Vital signs in last 24 hours:  Blood pressure (!) 148/84, pulse 73, temperature 98.7 F (37.1 C), temperature source Oral, resp. rate 20, height _0  (1.753 m), weight 167 lb (75.8 kg), SpO2 98 %.    Lymphatics: 1 cm or less low left cervical/scalene nodes, no other cervical, supraclavicular, axillary, or inguinal nodes Resp: Inspiratory rales at the right lower posterior chest, no respiratory distress Cardio: Regular rate and rhythm GI: No hepatosplenomegaly, no mass, nontender Vascular: No leg edema   Lab Results:  Lab Results  Component Value Date   WBC 4.3 04/24/2021   HGB 11.6 (L) 04/24/2021   HCT 33.6 (L) 04/24/2021   MCV 92.6 04/24/2021   PLT 184 04/24/2021   NEUTROABS 3.1 04/24/2021    CMP  Lab Results  Component Value Date   NA 133 (L) 04/24/2021   K 4.4 04/24/2021   CL 99 04/24/2021   CO2 28 04/24/2021   GLUCOSE 125 (H) 04/24/2021   BUN 11 04/24/2021   CREATININE 0.59 (L) 04/24/2021   CALCIUM 8.2 (L) 04/24/2021   PROT 6.5 04/24/2021   ALBUMIN 4.0 04/24/2021   AST 18 04/24/2021   ALT 17 04/24/2021   ALKPHOS 71 04/24/2021   BILITOT 0.5 04/24/2021   GFRNONAA >60 04/24/2021   GFRAA >60 06/01/2017     Medications: I have reviewed the patient's current medications.   Assessment/Plan: Mildly enlarged neck-chest lymph nodes CTs neck and chest 03/19/2021-cluster of left supraclavicular nodes measuring less than 1 cm, 12 mm right node next to the jugular vein, chest with slight interval progression of pulmonary fibrosis, new small sclerotic lesion in the  right third rib concerning for metastasis, lucent lesion in T8 corresponding to abnormal uptake on a bone scan 05/21/2020 suspicious for malignancy, subpectoral nodes-subcentimeter in size but larger than 03/28/2019 FNA of left neck node 03/25/2021-atypical lymphoid proliferation Excisional biopsy of level 5 left neck node 04/07/2021-lymph node with marked autolysis and diffusely infiltrated by atypical lymphocytes, 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  A biopsy is consistent with a B-cell lymphoproliferative disorder.  Evaluation of the lymph node pathology has been limited by degenerative changes in the tissue.  Dr. 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  [name] does not clearly have symptoms related to lymphoma.  Lymph nodes in the chest on CT had not changed significantly than a CT from 2 years ago.  The clinical presentation is not suggestive  of a high-grade large B-cell lymphoma.  The LDH is normal.  I discussed the pathology findings with [name].  He is scheduled for a staging PET scan on 05/26/2021.  He will return for an office visit  and further discussion on 05/27/2021.  I will recommend a repeat lymph node biopsy if an accessible node is found on the staging PET scan.  I doubt his GI symptoms are related to lymphoma, but this is possible.  Kenneth Coder, MD  05/20/2021  8:21 AM

## 2021-05-21 DIAGNOSIS — C61 Malignant neoplasm of prostate: Secondary | ICD-10-CM | POA: Diagnosis not present

## 2021-05-26 ENCOUNTER — Ambulatory Visit (HOSPITAL_COMMUNITY)
Admission: RE | Admit: 2021-05-26 | Discharge: 2021-05-26 | Disposition: A | Discharge status: Home / Self Care | Payer: PPO | Source: Ambulatory Visit | Attending: Oncology | Admitting: Oncology

## 2021-05-26 DIAGNOSIS — J8410 Pulmonary fibrosis, unspecified: Secondary | ICD-10-CM | POA: Diagnosis not present

## 2021-05-26 DIAGNOSIS — I2510 Atherosclerotic heart disease of native coronary artery without angina pectoris: Secondary | ICD-10-CM | POA: Diagnosis not present

## 2021-05-26 DIAGNOSIS — C8590 Non-Hodgkin lymphoma, unspecified, unspecified site: Secondary | ICD-10-CM | POA: Diagnosis not present

## 2021-05-26 DIAGNOSIS — Z8546 Personal history of malignant neoplasm of prostate: Secondary | ICD-10-CM | POA: Diagnosis not present

## 2021-05-26 DIAGNOSIS — R591 Generalized enlarged lymph nodes: Secondary | ICD-10-CM | POA: Diagnosis not present

## 2021-05-26 DIAGNOSIS — C61 Malignant neoplasm of prostate: Secondary | ICD-10-CM | POA: Diagnosis not present

## 2021-05-26 DIAGNOSIS — R3915 Urgency of urination: Secondary | ICD-10-CM | POA: Diagnosis not present

## 2021-05-26 LAB — GLUCOSE, CAPILLARY: Glucose-Capillary: 118 mg/dL — ABNORMAL HIGH (ref 70–99)

## 2021-05-26 MED ORDER — FLUDEOXYGLUCOSE F - 18 (FDG) INJECTION
9.3100 | Freq: Once | INTRAVENOUS | Status: AC | PRN
  Administered 2021-05-26: 9.31 via INTRAVENOUS

## 2021-05-27 ENCOUNTER — Telehealth: Payer: Self-pay

## 2021-05-27 ENCOUNTER — Inpatient Hospital Stay (HOSPITAL_BASED_OUTPATIENT_CLINIC_OR_DEPARTMENT_OTHER): Payer: PPO | Admitting: Oncology

## 2021-05-27 ENCOUNTER — Other Ambulatory Visit: Payer: Self-pay

## 2021-05-27 VITALS — BP 140/79 | HR 88 | Temp 97.8°F | Resp 20 | Ht 69.0 in | Wt 169.2 lb

## 2021-05-27 DIAGNOSIS — R591 Generalized enlarged lymph nodes: Secondary | ICD-10-CM | POA: Diagnosis not present

## 2021-05-27 DIAGNOSIS — C8590 Non-Hodgkin lymphoma, unspecified, unspecified site: Secondary | ICD-10-CM | POA: Diagnosis not present

## 2021-05-27 NOTE — Telephone Encounter (Signed)
T/C to Pt to inform him that Dr [name] made calls to Dr Redmond Baseman and Dr Henrene Pastor. Informed Pt that Both providers are out of town but [name] office will schedule him an appointment on  Monday and will give him a call to confirm. Pt stated he received a call from [name] and will try to call Dr Blanch Media office to see if he can get an appointment scheduled.

## 2021-05-27 NOTE — Progress Notes (Signed)
[facility] OFFICE PROGRESS NOTE   Diagnosis: Non-Hodgkin's lymphoma  INTERVAL HISTORY:   Dr. Annamaria Boots returns as scheduled.  Kenneth [name] underwent a staging PET scan yesterday.  No new complaint.  No fever or night sweats.  Kenneth [name] continues to have intermittent nausea, relieved with Zofran.  Kenneth [name] has anorexia.  Kenneth [name] has irregular bowel habits.  Objective:  Vital signs in last 24 hours:  Blood pressure 140/79, pulse 88, temperature 97.8 F (36.6 C), temperature source Oral, resp. rate 20, height _0  (1.753 m), weight 169 lb 3.2 oz (76.7 kg), SpO2 97 %.   Lymphatics: Less than 1 cm left scalene/low cervical nodes, pea-sized right scalene node, 1 cm mobile left axillary node GI: No hepatosplenomegaly, no mass, nontender Vascular: No leg edema    Lab Results:  Lab Results  Component Value Date   WBC 4.3 04/24/2021   HGB 11.6 (L) 04/24/2021   HCT 33.6 (L) 04/24/2021   MCV 92.6 04/24/2021   PLT 184 04/24/2021   NEUTROABS 3.1 04/24/2021    CMP  Lab Results  Component Value Date   NA 133 (L) 04/24/2021   K 4.4 04/24/2021   CL 99 04/24/2021   CO2 28 04/24/2021   GLUCOSE 125 (H) 04/24/2021   BUN 11 04/24/2021   CREATININE 0.59 (L) 04/24/2021   CALCIUM 8.2 (L) 04/24/2021   PROT 6.5 04/24/2021   ALBUMIN 4.0 04/24/2021   AST 18 04/24/2021   ALT 17 04/24/2021   ALKPHOS 71 04/24/2021   BILITOT 0.5 04/24/2021   GFRNONAA >60 04/24/2021   GFRAA >60 06/01/2017    No results found for: CEA1, CEA, YEL859, CA125  No results found for: INR, LABPROT  Imaging:  NM PET Image Initial (PI) Skull Base To Thigh (F-18 FDG)  Result Date: 05/26/2021 CLINICAL DATA:  Initial treatment strategy for non-Hodgkin's lymphoma. History of prostate cancer. EXAM: NUCLEAR MEDICINE PET SKULL BASE TO THIGH TECHNIQUE: 9.31 mCi F-18 FDG was injected intravenously. Full-ring PET imaging was performed from the skull base to thigh after the radiotracer. CT data was obtained and used for attenuation  correction and anatomic localization. Fasting blood glucose: 118 mg/dl COMPARISON:  CTs of the neck and chest 03/19/2021. Abdominopelvic CT 05/21/2020 FINDINGS: Mediastinal blood pool activity: SUV max 1.8 Liver activity: SUV max 2.7 NECK: There are multiple small hypermetabolic mid to lower cervical lymph nodes bilaterally, largest a level 3 node on the right measuring 11 mm short axis on image 41/4 with an SUV max of 10.9. As on prior diagnostic CT, the nodes are more numerous on the left, and involve the posterior triangle.There are no lesions of the pharyngeal mucosal space. Incidental CT findings: Bilateral carotid atherosclerosis. CHEST: There are multiple small hypermetabolic mediastinal and left axillary lymph nodes. The most hypermetabolic in the right paratracheal station, demonstrating an SUV max of 10.8. There is a left axillary node with an SUV max of 8.2. There are also small hypermetabolic hilar and right internal mammary lymph nodes. No hypermetabolic pulmonary activity or suspicious nodularity. Incidental CT findings: Stable changes of pulmonary fibrosis. Atherosclerosis of the aorta, great vessels and coronary arteries. Probable calcifications of the aortic valve. ABDOMEN/PELVIS: There is no hypermetabolic activity within the liver, adrenal glands, spleen or pancreas. There are several small hypermetabolic retroperitoneal lymph nodes, including small left periaortic nodes within SUV max of 8.5. Focal metabolic activity in the right false pelvis may correspond with the right ureter as no enlarged lymph node is seen in this region. There is diffuse asymmetric  hypermetabolic activity associated with the right colon (SUV max 7.0). There may be some associated wall thickening in these regions, but no focal constricting lesion. No abnormal activity is seen associated with the prostate gland. Incidental CT findings: Small gallstone, diverticular changes of the distal colon and postsurgical changes in the  prostate gland are noted. There is aortic and branch vessel atherosclerosis with mild tortuosity. SKELETON: There is no hypermetabolic activity to suggest osseous metastatic disease. Specifically, there is no hypermetabolic activity associated with the small sclerotic lesion in the right 3rd rib or the lucent lesion posteriorly in the T8 vertebral body. There is fairly symmetric hypermetabolic activity within the sacral ala bilaterally (SUV max 3.1 on the left). No corresponding focal lytic or blastic lesion, and this may be stress mediated. Incidental CT findings: Multilevel thoracolumbar spondylosis associated with a convex right scoliosis. IMPRESSION: 1. Multiple small hypermetabolic lymph nodes in the neck, chest and abdomen consistent with lymphoma. 2. No solid visceral organ involvement identified. The spleen appears normal. 3. Asymmetric wall thickening and mild diffuse hypermetabolic activity throughout the right colon could be inflammatory or indicative of lymphomatous involvement. No evidence of bowel obstruction. Attention on follow-up recommended. 4. No definite hypermetabolic osseous involvement by lymphoma. The previously demonstrated right 3rd rib and T8 lesions do not show hypermetabolic activity. Symmetric metabolic activity within the sacral ala may be stress mediated. Electronically Signed   By: Richardean Sale M.D.   On: 05/26/2021 16:42    Medications: I have reviewed the patient's current medications.   Assessment/Plan:  Non-Hodgkin's lymphoma CTs neck and chest 03/19/2021-cluster of left supraclavicular nodes measuring less than 1 cm, 12 mm right node next to the jugular vein, chest with slight interval progression of pulmonary fibrosis, new small sclerotic lesion in the right third rib concerning for metastasis, lucent lesion in T8 corresponding to abnormal uptake on a bone scan 05/21/2020 suspicious for malignancy, subpectoral nodes-subcentimeter in size but larger than 03/28/2019 FNA  of left neck node 03/25/2021-atypical lymphoid proliferation Excisional biopsy of level 5 left neck node 04/07/2021-lymph node with marked autolysis and diffusely infiltrated by atypical lymphocytes, 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 Annamaria Boots appears unchanged.  The PET scan reveals multiple small hypermetabolic nodes in the neck, chest, and abdomen.  We reviewed the PET images.  I suspect the asymmetric uptake in the colon is a benign finding, but Kenneth [name] could have lymphoma involving the colon.  His chief complaints are nausea and irregular bowel habits.  Kenneth [name] would like to undergo a GI evaluation.  I will contact Dr. Henrene Pastor.  Kenneth [name] appears to have lymphoma.  The left neck biopsy is most consistent with a high-grade lymphoma, but the lymph nodes are small and Kenneth [name] does not have symptoms to suggest a high-grade process.  I recommend a repeat lymph node  biopsy to obtain fresh tissue for routine histology and additional testing.  I will ask [name] to consider biopsy of the hypermetabolic right neck node.  Dr. Annamaria Boots  will return for an office visit on 06/12/2021.  Kenneth [name] will contact me in the interim for new symptoms.  [name], MD  05/27/2021  8:03 AM

## 2021-05-28 ENCOUNTER — Telehealth: Payer: Self-pay | Admitting: Internal Medicine

## 2021-05-28 NOTE — Telephone Encounter (Signed)
Pt wants to reschedule endo/colon due to recent diagnosis of lymphoma and abnormal PET scan in his bowel. Pt wanted to schedule procedure without office visit. Please advise.

## 2021-06-01 NOTE — Telephone Encounter (Signed)
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 See my recent office note. Now with possible lymphoma. Please schedule with me (both colon and EGD) in 10 am slot on Thursday Dec. 8 at 10 am. Please have someone call him with the instructions. Please let me know when this has been completed. Thanks, Dr. Henrene Pastor

## 2021-06-02 ENCOUNTER — Telehealth: Payer: Self-pay | Admitting: *Deleted

## 2021-06-02 ENCOUNTER — Other Ambulatory Visit: Payer: Self-pay

## 2021-06-02 DIAGNOSIS — C8511 Unspecified B-cell lymphoma, lymph nodes of head, face, and neck: Secondary | ICD-10-CM | POA: Diagnosis not present

## 2021-06-02 DIAGNOSIS — R948 Abnormal results of function studies of other organs and systems: Secondary | ICD-10-CM

## 2021-06-02 MED ORDER — SUPREP BOWEL PREP KIT 17.5-3.13-1.6 GM/177ML PO SOLN: ORAL | 0 refills | Status: DC

## 2021-06-02 NOTE — Telephone Encounter (Signed)
Patient notified of procedure date and time.  New instructions mailed to his home and sent via [place].  He verbalized understanding to call if he has any changes once he reviews instructions.

## 2021-06-02 NOTE — Telephone Encounter (Signed)
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 Henrene Pastor will be on 06/12/21 (same day as his OV w/Dr. [name]). Asking when does Dr. [name] want to see him? Would 12/9 be available?

## 2021-06-02 NOTE — Telephone Encounter (Signed)
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 [name] on 06/13/21 at 0830

## 2021-06-10 ENCOUNTER — Other Ambulatory Visit (HOSPITAL_COMMUNITY)
Admission: RE | Admit: 2021-06-10 | Discharge: 2021-06-10 | Disposition: A | Discharge status: Home / Self Care | Payer: PPO | Source: Ambulatory Visit | Attending: Otolaryngology | Admitting: Otolaryngology

## 2021-06-10 DIAGNOSIS — C8510 Unspecified B-cell lymphoma, unspecified site: Secondary | ICD-10-CM | POA: Insufficient documentation

## 2021-06-10 DIAGNOSIS — C8511 Unspecified B-cell lymphoma, lymph nodes of head, face, and neck: Secondary | ICD-10-CM | POA: Diagnosis not present

## 2021-06-10 DIAGNOSIS — H524 Presbyopia: Secondary | ICD-10-CM | POA: Diagnosis not present

## 2021-06-10 DIAGNOSIS — Z961 Presence of intraocular lens: Secondary | ICD-10-CM | POA: Diagnosis not present

## 2021-06-12 ENCOUNTER — Ambulatory Visit: Payer: PPO | Admitting: Oncology

## 2021-06-12 ENCOUNTER — Other Ambulatory Visit: Payer: Self-pay

## 2021-06-12 ENCOUNTER — Ambulatory Visit (AMBULATORY_SURGERY_CENTER): Payer: PPO | Admitting: Internal Medicine

## 2021-06-12 ENCOUNTER — Encounter: Payer: Self-pay | Admitting: Internal Medicine

## 2021-06-12 VITALS — BP 113/66 | HR 77 | Temp 96.2°F | Resp 14 | Ht 69.0 in | Wt 168.0 lb

## 2021-06-12 DIAGNOSIS — K5730 Diverticulosis of large intestine without perforation or abscess without bleeding: Secondary | ICD-10-CM

## 2021-06-12 DIAGNOSIS — R110 Nausea: Secondary | ICD-10-CM | POA: Diagnosis not present

## 2021-06-12 DIAGNOSIS — K648 Other hemorrhoids: Secondary | ICD-10-CM

## 2021-06-12 DIAGNOSIS — R935 Abnormal findings on diagnostic imaging of other abdominal regions, including retroperitoneum: Secondary | ICD-10-CM | POA: Diagnosis not present

## 2021-06-12 DIAGNOSIS — K449 Diaphragmatic hernia without obstruction or gangrene: Secondary | ICD-10-CM | POA: Diagnosis not present

## 2021-06-12 DIAGNOSIS — R195 Other fecal abnormalities: Secondary | ICD-10-CM | POA: Diagnosis not present

## 2021-06-12 DIAGNOSIS — R1013 Epigastric pain: Secondary | ICD-10-CM | POA: Diagnosis not present

## 2021-06-12 DIAGNOSIS — K219 Gastro-esophageal reflux disease without esophagitis: Secondary | ICD-10-CM

## 2021-06-12 MED ORDER — SODIUM CHLORIDE 0.9 % IV SOLN: 500.0000 mL | Freq: Once | INTRAVENOUS | Status: DC

## 2021-06-12 NOTE — Progress Notes (Signed)
HISTORY OF PRESENT ILLNESS:  Kenneth [name] is a 80 y.o. male presents today for colonoscopy and upper endoscopy.  Recently diagnosed with probable lymphoma.  Abnormal PET scan of the colon.  Last colonoscopy 2013.  Also having dyspeptic symptoms with postprandial fullness.  REVIEW OF SYSTEMS:  All non-GI ROS negative. Past Medical History:  Diagnosis Date   Arthritis    Diverticulosis of colon (without mention of hemorrhage)    GERD (gastroesophageal reflux disease)    Hiatal hernia    HLD (hyperlipidemia)    Internal hemorrhoids without mention of complication    Irritable bowel syndrome    Other specified gastritis without mention of hemorrhage    Prostate cancer ([place])    Pulmonary fibrosis ([facility])    Stress fracture 02/2021    Past Surgical History:  Procedure Laterality Date   CARPAL TUNNEL RELEASE     left   CATARACT EXTRACTION  06/2011   bilateral   DEEP NECK LYMPH NODE BIOPSY / EXCISION  03/25/2021   INGUINAL HERNIA REPAIR Right 09/05/2019   Procedure: OPEN REPAIR RIGHT INGUINAL HERNIA WITH MESH;  Surgeon: Armandina Gemma, MD;  Location: [facility];  Service: General;  Laterality: Right;   [place]   x 2   LUMBAR LAMINECTOMY/DECOMPRESSION MICRODISCECTOMY Left 07/11/2013   Procedure: LUMBAR ONE TO TWO, LUMBAR TWO TO THREE, LUMBAR THREE TO FOUR LUMBAR LAMINECTOMY/DECOMPRESSION MICRODISCECTOMY 3 LEVELS;  Surgeon: Charlie Pitter, MD;  Location: [facility];  Service: Neurosurgery;  Laterality: Left;   NASAL SINUS SURGERY      Social History Kenneth [name]  reports that he has never smoked. He has never used smokeless tobacco. He reports that he does not currently use alcohol. He reports that he does not use drugs.  family history includes Bladder Cancer in his mother; Breast cancer in his mother; Heart failure (age of onset: 47) in his father.  No Known Allergies     PHYSICAL EXAMINATION:  Vital signs: BP 113/60   Pulse 85   Temp (!) 96.2 F (35.7 C)    Ht _0  (1.753 m)   Wt 168 lb (76.2 kg)   SpO2 97%   BMI 24.81 kg/m  General: Well-developed, well-nourished, no acute distress HEENT: Sclerae are anicteric, conjunctiva pink. Oral mucosa intact Lungs: Clear Heart: Regular Abdomen: soft, nontender, nondistended, no obvious ascites, no peritoneal signs, normal bowel sounds. No organomegaly. Extremities: No edema Psychiatric: alert and oriented x3. Cooperative     ASSESSMENT:  1.  Abnormal PET scan of the colon 2.  Dyspepsia and postprandial fullness   PLAN:  1.  Colonoscopy and upper endoscopy

## 2021-06-12 NOTE — Op Note (Signed)
[facility] Patient Name: Kenneth [name] Procedure Date: 06/12/2021 10:19 AM MRN: [id number] Endoscopist: Docia Chuck. Henrene Pastor , MD Age: 80 Referring MD:  Date of Birth: August 23, 1940 Gender: Male Account #: [id number] Procedure:                Colonoscopy Indications:              Abnormal CT of the GI tract (abnormal activity                            right colon on PET scan). Previous colonoscopy 2013                            (Dr. Sharlett Iles) was negative for neoplasia Medicines:                Monitored Anesthesia Care Procedure:                Pre-Anesthesia Assessment:                           - Prior to the procedure, a History and Physical                            was performed, and patient medications and                            allergies were reviewed. The patient's tolerance of                            previous anesthesia was also reviewed. The risks                            and benefits of the procedure and the sedation                            options and risks were discussed with the patient.                            All questions were answered, and informed consent                            was obtained. Prior Anticoagulants: The patient has                            taken no previous anticoagulant or antiplatelet                            agents. ASA Grade Assessment: II - A patient with                            mild systemic disease. After reviewing the risks                            and benefits, the patient was deemed in  satisfactory condition to undergo the procedure.                           After obtaining informed consent, the colonoscope                            was passed under direct vision. Throughout the                            procedure, the patient's blood pressure, pulse, and                            oxygen saturations were monitored continuously. The                            CF [id number] #[id number] was  introduced through the anus                            and advanced to the the cecum, identified by                            appendiceal orifice and ileocecal valve. The                            terminal ileum, ileocecal valve, appendiceal                            orifice, and rectum were photographed. The quality                            of the bowel preparation was excellent. The                            colonoscopy was performed without difficulty. The                            patient tolerated the procedure well. The bowel                            preparation used was SUPREP via split dose                            instruction. Scope In: 10:27:36 AM Scope Out: 10:38:50 AM Scope Withdrawal Time: 0 hours 8 minutes 21 seconds  Total Procedure Duration: 0 hours 11 minutes 14 seconds  Findings:                 The terminal ileum appeared normal.                           Multiple diverticula were found in the left colon                            and right colon.  Internal hemorrhoids were found during retroflexion.                           The exam was otherwise without abnormality on                            direct and retroflexion views. Complications:            No immediate complications. Estimated blood loss:                            None. Estimated Blood Loss:     Estimated blood loss: none. Impression:               - The examined portion of the ileum was normal.                           - Diverticulosis in the left colon and in the right                            colon.                           - Internal hemorrhoids.                           - The examination was otherwise normal on direct                            and retroflexion views.                           - No specimens collected. Recommendation:           - Repeat colonoscopy is not recommended for                            surveillance.                           -  Patient has a contact number available for                            emergencies. The signs and symptoms of potential                            delayed complications were discussed with the                            patient. Return to normal activities tomorrow.                            Written discharge instructions were provided to the                            patient.                           -  Resume previous diet.                           - Continue present medications. Docia Chuck. Henrene Pastor, MD 06/12/2021 10:42:19 AM This report has been signed electronically.

## 2021-06-12 NOTE — Progress Notes (Signed)
A and O x3. Report to RN. Tolerated MAC anesthesia well.Teeth unchanged after procedure. 

## 2021-06-12 NOTE — Patient Instructions (Signed)
Handouts on hemorrhoids, diverticulosis, and hiatal hernia given to you today  YOU HAD AN ENDOSCOPIC PROCEDURE TODAY AT [facility]:   Refer to the procedure report that was given to you for any specific questions about what was found during the examination.  If the procedure report does not answer your questions, please call your gastroenterologist to clarify.  If you requested that your care partner not be given the details of your procedure findings, then the procedure report has been included in a sealed envelope for you to review at your convenience later.  YOU SHOULD EXPECT: Some feelings of bloating in the abdomen. Passage of more gas than usual.  Walking can help get rid of the air that was put into your GI tract during the procedure and reduce the bloating. If you had a lower endoscopy (such as a colonoscopy or flexible sigmoidoscopy) you may notice spotting of blood in your stool or on the toilet paper. If you underwent a bowel prep for your procedure, you may not have a normal bowel movement for a few days.  Please Note:  You might notice some irritation and congestion in your nose or some drainage.  This is from the oxygen used during your procedure.  There is no need for concern and it should clear up in a day or so.  SYMPTOMS TO REPORT IMMEDIATELY:  Following lower endoscopy (colonoscopy or flexible sigmoidoscopy):  Excessive amounts of blood in the stool  Significant tenderness or worsening of abdominal pains  Swelling of the abdomen that is new, acute  Fever of 100F or higher  Following upper endoscopy (EGD)  Vomiting of blood or coffee ground material  New chest pain or pain under the shoulder blades  Painful or persistently difficult swallowing  New shortness of breath  Fever of 100F or higher  Black, tarry-looking stools  For urgent or emergent issues, a gastroenterologist can be reached at any hour by calling [phone]. Do not use MyChart messaging  for urgent concerns.    DIET:  We do recommend a small meal at first, but then you may proceed to your regular diet.  Drink plenty of fluids but you should avoid alcoholic beverages for 24 hours.  ACTIVITY:  You should plan to take it easy for the rest of today and you should NOT DRIVE or use heavy machinery until tomorrow (because of the sedation medicines used during the test).    FOLLOW UP: Our staff will call the number listed on your records 48-72 hours following your procedure to check on you and address any questions or concerns that you may have regarding the information given to you following your procedure. If we do not reach you, we will leave a message.  We will attempt to reach you two times.  During this call, we will ask if you have developed any symptoms of COVID 19. If you develop any symptoms (ie: fever, flu-like symptoms, shortness of breath, cough etc.) before then, please call [phone].  If you test positive for Covid 19 in the 2 weeks post procedure, please call and report this information to [place].     SIGNATURES/CONFIDENTIALITY: You and/or your care partner have signed paperwork which will be entered into your electronic medical record.  These signatures attest to the fact that that the information above on your After Visit Summary has been reviewed and is understood.  Full responsibility of the confidentiality of this discharge information lies with you and/or your care-partner.

## 2021-06-12 NOTE — Op Note (Signed)
[facility] Patient Name: Kenneth [name] Procedure Date: 06/12/2021 10:10 AM MRN: [id number] Endoscopist: Docia Chuck. Henrene Pastor , MD Age: 80 Referring MD:  Date of Birth: [birth year] Gender: Male Account #: [id number] Procedure:                Upper GI endoscopy Indications:              Dyspepsia, Esophageal reflux, Nausea Medicines:                Monitored Anesthesia Care Procedure:                Pre-Anesthesia Assessment:                           - Prior to the procedure, a History and Physical                            was performed, and patient medications and                            allergies were reviewed. The patient's tolerance of                            previous anesthesia was also reviewed. The risks                            and benefits of the procedure and the sedation                            options and risks were discussed with the patient.                            All questions were answered, and informed consent                            was obtained. Prior Anticoagulants: The patient has                            taken no previous anticoagulant or antiplatelet                            agents. ASA Grade Assessment: II - A patient with                            mild systemic disease. After reviewing the risks                            and benefits, the patient was deemed in                            satisfactory condition to undergo the procedure.                           After obtaining informed consent, the endoscope was  passed under direct vision. Throughout the                            procedure, the patient's blood pressure, pulse, and                            oxygen saturations were monitored continuously. The                            GIF [id number] #[id number] was introduced through the                            mouth, and advanced to the second part of duodenum.                            The upper GI endoscopy  was accomplished without                            difficulty. The patient tolerated the procedure                            well. Scope In: Scope Out: Findings:                 The esophagus was normal.                           The stomach was normal, save hiatal hernia.                           The examined duodenum was normal.                           The cardia and gastric fundus were normal on                            retroflexion. Complications:            No immediate complications. Estimated Blood Loss:     Estimated blood loss: none. Impression:               1. Essentially normal EGD. Recommendation:           - Patient has a contact number available for                            emergencies. The signs and symptoms of potential                            delayed complications were discussed with the                            patient. Return to normal activities tomorrow.                            Written discharge instructions were provided to the  patient.                           - Resume previous diet.                           - Continue present medications. Docia Chuck. Henrene Pastor, MD 06/12/2021 10:50:18 AM This report has been signed electronically.

## 2021-06-12 NOTE — Progress Notes (Signed)
Pt's states no medical or surgical changes since previsit or office visit. VS by DT. °

## 2021-06-13 ENCOUNTER — Inpatient Hospital Stay: Payer: PPO | Attending: Oncology | Admitting: Oncology

## 2021-06-13 ENCOUNTER — Encounter: Payer: Self-pay | Admitting: Oncology

## 2021-06-13 VITALS — BP 137/72 | HR 96 | Temp 98.1°F | Resp 20 | Ht 69.0 in | Wt 168.4 lb

## 2021-06-13 DIAGNOSIS — R591 Generalized enlarged lymph nodes: Secondary | ICD-10-CM | POA: Diagnosis not present

## 2021-06-13 DIAGNOSIS — R5381 Other malaise: Secondary | ICD-10-CM | POA: Insufficient documentation

## 2021-06-13 DIAGNOSIS — Z298 Encounter for other specified prophylactic measures: Secondary | ICD-10-CM | POA: Insufficient documentation

## 2021-06-13 DIAGNOSIS — J84112 Idiopathic pulmonary fibrosis: Secondary | ICD-10-CM | POA: Insufficient documentation

## 2021-06-13 DIAGNOSIS — C61 Malignant neoplasm of prostate: Secondary | ICD-10-CM | POA: Insufficient documentation

## 2021-06-13 DIAGNOSIS — Z79899 Other long term (current) drug therapy: Secondary | ICD-10-CM | POA: Diagnosis not present

## 2021-06-13 DIAGNOSIS — K648 Other hemorrhoids: Secondary | ICD-10-CM | POA: Insufficient documentation

## 2021-06-13 DIAGNOSIS — Z2809 Immunization not carried out because of other contraindication: Secondary | ICD-10-CM | POA: Insufficient documentation

## 2021-06-13 DIAGNOSIS — K5790 Diverticulosis of intestine, part unspecified, without perforation or abscess without bleeding: Secondary | ICD-10-CM | POA: Insufficient documentation

## 2021-06-13 DIAGNOSIS — K589 Irritable bowel syndrome without diarrhea: Secondary | ICD-10-CM | POA: Diagnosis not present

## 2021-06-13 DIAGNOSIS — C8590 Non-Hodgkin lymphoma, unspecified, unspecified site: Secondary | ICD-10-CM | POA: Diagnosis not present

## 2021-06-13 DIAGNOSIS — C8330 Diffuse large B-cell lymphoma, unspecified site: Secondary | ICD-10-CM | POA: Diagnosis not present

## 2021-06-13 DIAGNOSIS — R630 Anorexia: Secondary | ICD-10-CM | POA: Insufficient documentation

## 2021-06-13 LAB — SURGICAL PATHOLOGY

## 2021-06-13 NOTE — Progress Notes (Addendum)
[place] OFFICE PROGRESS NOTE   Diagnosis: Non-Hodgkin's lymphoma  INTERVAL HISTORY:   Kenneth [name] returns as scheduled.  Kenneth [name] continues to have malaise and anorexia.  Kenneth [name] relates the symptoms in part to the pulmonary fibrosis and pirfenidone.  Kenneth [name] underwent a negative upper endoscopy 06/12/2021.  Colonoscopy the same day revealed diverticulosis and internal hemorrhoids.  Kenneth [name] underwent a left neck lymph node biopsy by Kenneth [name]  Objective:  Vital signs in last 24 hours:  Blood pressure 137/72, pulse 96, temperature 98.1 F (36.7 C), temperature source Oral, resp. rate 20, height _0  (1.753 m), weight 168 lb 6.4 oz (76.4 kg), SpO2 100 %.    HEENT: Surgical incision at the left neck with sutures in place Lymphatics: No cervical, supraclavicular, axillary, or inguinal nodes Resp: Inspiratory rales at the right greater than left lower posterior chest Cardio: Regular rate and rhythm GI: No hepatosplenomegaly Vascular: No leg edema    Lab Results:  Lab Results  Component Value Date   WBC 4.3 04/24/2021   HGB 11.6 (L) 04/24/2021   HCT 33.6 (L) 04/24/2021   MCV 92.6 04/24/2021   PLT 184 04/24/2021   NEUTROABS 3.1 04/24/2021    CMP  Lab Results  Component Value Date   NA 133 (L) 04/24/2021   K 4.4 04/24/2021   CL 99 04/24/2021   CO2 28 04/24/2021   GLUCOSE 125 (H) 04/24/2021   BUN 11 04/24/2021   CREATININE 0.59 (L) 04/24/2021   CALCIUM 8.2 (L) 04/24/2021   PROT 6.5 04/24/2021   ALBUMIN 4.0 04/24/2021   AST 18 04/24/2021   ALT 17 04/24/2021   ALKPHOS 71 04/24/2021   BILITOT 0.5 04/24/2021   GFRNONAA >60 04/24/2021   GFRAA >60 06/01/2017     Medications: I have reviewed the patient's current medications.   Assessment/Plan: Non-Hodgkin's lymphoma CTs neck and chest 03/19/2021-cluster of left supraclavicular nodes measuring less than 1 cm, 12 mm right node next to the jugular vein, chest with slight interval progression of pulmonary fibrosis, new  small sclerotic lesion in the right third rib concerning for metastasis, lucent lesion in T8 corresponding to abnormal uptake on a bone scan 05/21/2020 suspicious for malignancy, subpectoral nodes-subcentimeter in size but larger than 03/28/2019 FNA of left neck node 03/25/2021-atypical lymphoid proliferation Excisional biopsy of level 5 left neck node 04/07/2021-lymph node with marked autolysis and diffusely infiltrated by atypical lymphocytes, 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  Kenneth [name] has been diagnosed with  non-Hodgkin's lymphoma.  I discussed the surgical pathology with Dr. Gari [name].  Kenneth [name] indicates the lymph node is involved with a large cell lymphoma.  There are follicular and diffuse components.  Kenneth [name] favors a high-grade process.  The lymphoma appears to be behaving in a more indolent fashion as the measurable lymph nodes are small and the LDH is normal.  Small lymph nodes appear to have been present on a CT from 2020.  I will request a Ki67 on the current lymph node biopsy. I discussed treatment option with Kenneth [name].  I favor treatment as opposed to observation.  We discussed R-CHOP and Bendamustine/rituximab.  My initial impression is to recommend treatment with Bendamustine/rituximab.  I favor this regimen given his age and comorbid conditions.  We can also consider treatment with R-mini CHOP.  We will finalize a treatment plan when the final pathology report is available.  I reviewed potential toxicities associated with the Bendamustine/rituximab regimen.  We discussed the potential for nausea/vomiting, mucositis, alopecia, and hematologic toxicity.  We discussed the allergic reaction, atypical infection, pneumonitis, CNS toxicity, and hematologic toxicity associated with rituximab.  Kenneth [name] agrees to proceed.  Kenneth [name] will attend a chemotherapy teaching class.  The plan is to initiate systemic therapy on 06/25/2021.  I will contact Kenneth [name] when the final pathology report is available. Kenneth Coder, MD  06/13/2021  4:20 PM  I discussed the case with Dr. Gari [name] on 06/13/2021.  I received the surgical pathology report on 06/16/2021.  Dr. Gari [name] indicates the lymph node is involved with a large B-cell lymphoma with brisk mitoses.  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  I discussed the final pathology findings with Kenneth [name].  I recommend treatment with R-CHOP given the significant diffuse large cell component.  I recommended beginning treatment with  R-mini CHOP with his multiple comorbid conditions.  We reviewed potential toxicities associated with rituximab again today.  We discussed the nausea/vomiting, mucositis, alopecia, and hematologic toxicity associated with the CHOP regimen.  We discussed the vesicant property of doxorubicin and vincristine.  We discussed the chance of neuropathy from vincristine.  We reviewed the cardiac toxicity associated with doxorubicin.  Kenneth [name] agrees to proceed.  Kenneth [name] will attend a chemotherapy teaching class.  Kenneth [name] would like to wait on beginning treatment until after the holidays.  Kenneth [name] is scheduled to begin R-mini CHOP on 07/11/2020.  A chemotherapy plan was entered today.

## 2021-06-16 ENCOUNTER — Telehealth: Payer: Self-pay | Admitting: *Deleted

## 2021-06-16 ENCOUNTER — Encounter: Payer: Self-pay | Admitting: *Deleted

## 2021-06-16 ENCOUNTER — Telehealth: Payer: Self-pay

## 2021-06-16 DIAGNOSIS — C61 Malignant neoplasm of prostate: Secondary | ICD-10-CM | POA: Diagnosis not present

## 2021-06-16 NOTE — Progress Notes (Signed)
Return call to Dr Annamaria Boots we are awaiting final pathology report and will call him. He would like to start treatment after the first of the New year but will attend Patient Education session on 12/14//22

## 2021-06-16 NOTE — Telephone Encounter (Signed)
  Follow up Call-  Call back number 06/12/2021  Post procedure Call Back phone  # [phone]  Permission to leave phone message Yes  Some recent data might be hidden     Patient questions:  Do you have a fever, pain , or abdominal swelling? No. Pain Score  0 *  Have you tolerated food without any problems? Yes.    Have you been able to return to your normal activities? Yes.    Do you have any questions about your discharge instructions: Diet   No. Medications  No. Follow up visit  No.  Do you have questions or concerns about your Care? No.  Actions: * If pain score is 4 or above: No action needed, pain <4.

## 2021-06-16 NOTE — Telephone Encounter (Signed)
Dr. Annamaria Boots left VM that he wishes to delay chemotherapy to after 1st of the year due to family gatherings around holiday.  Also requesting Dr. [name] call him today to go over the final path report. Scheduling message sent for changes and to inquire if he will still attend his education session on 06/18/21.

## 2021-06-16 NOTE — Progress Notes (Signed)
Ki67 requested from cervical biopsy 06/10/21 accession # [id number]

## 2021-06-17 ENCOUNTER — Other Ambulatory Visit: Payer: Self-pay | Admitting: *Deleted

## 2021-06-17 ENCOUNTER — Other Ambulatory Visit (HOSPITAL_COMMUNITY): Payer: Self-pay

## 2021-06-17 ENCOUNTER — Ambulatory Visit (INDEPENDENT_AMBULATORY_CARE_PROVIDER_SITE_OTHER): Payer: PPO | Admitting: Internal Medicine

## 2021-06-17 ENCOUNTER — Telehealth: Payer: Self-pay | Admitting: Internal Medicine

## 2021-06-17 ENCOUNTER — Encounter: Payer: Self-pay | Admitting: Internal Medicine

## 2021-06-17 ENCOUNTER — Other Ambulatory Visit: Payer: Self-pay

## 2021-06-17 ENCOUNTER — Telehealth: Payer: Self-pay | Admitting: Pharmacist

## 2021-06-17 ENCOUNTER — Encounter: Payer: Self-pay | Admitting: Oncology

## 2021-06-17 ENCOUNTER — Encounter: Payer: PPO | Admitting: *Deleted

## 2021-06-17 VITALS — BP 112/62 | HR 92 | Temp 97.8°F | Ht 69.0 in | Wt 168.2 lb

## 2021-06-17 DIAGNOSIS — J84112 Idiopathic pulmonary fibrosis: Secondary | ICD-10-CM

## 2021-06-17 DIAGNOSIS — C8330 Diffuse large B-cell lymphoma, unspecified site: Secondary | ICD-10-CM | POA: Insufficient documentation

## 2021-06-17 DIAGNOSIS — Z006 Encounter for examination for normal comparison and control in clinical research program: Secondary | ICD-10-CM

## 2021-06-17 DIAGNOSIS — Z5181 Encounter for therapeutic drug level monitoring: Secondary | ICD-10-CM

## 2021-06-17 LAB — BASIC METABOLIC PANEL
BUN: 13 mg/dL (ref 6–23)
CO2: 30 mEq/L (ref 19–32)
Calcium: 9.3 mg/dL (ref 8.4–10.5)
Chloride: 100 mEq/L (ref 96–112)
Creatinine, Ser: 0.63 mg/dL (ref 0.40–1.50)
GFR: 90.39 mL/min (ref >60.00)
Glucose, Bld: 89 mg/dL (ref 70–99)
Potassium: 4.4 mEq/L (ref 3.5–5.1)
Sodium: 135 mEq/L (ref 135–145)

## 2021-06-17 LAB — CBC WITH DIFFERENTIAL/PLATELET
Basophils Absolute: 0 10*3/uL (ref 0.0–0.1)
Basophils Relative: 0.3 % (ref 0.0–3.0)
Eosinophils Absolute: 0.2 10*3/uL (ref 0.0–0.7)
Eosinophils Relative: 2.6 % (ref 0.0–5.0)
HCT: 36.2 % — ABNORMAL LOW (ref 39.0–52.0)
Hemoglobin: 12.3 g/dL — ABNORMAL LOW (ref 13.0–17.0)
Lymphocytes Relative: 9.6 % — ABNORMAL LOW (ref 12.0–46.0)
Lymphs Abs: 0.6 10*3/uL — ABNORMAL LOW (ref 0.7–4.0)
MCHC: 34 g/dL (ref 30.0–36.0)
MCV: 93.9 fl (ref 78.0–100.0)
Monocytes Absolute: 0.6 10*3/uL (ref 0.1–1.0)
Monocytes Relative: 9.8 % (ref 3.0–12.0)
Neutro Abs: 4.9 10*3/uL (ref 1.4–7.7)
Neutrophils Relative %: 77.7 % — ABNORMAL HIGH (ref 43.0–77.0)
Platelets: 212 10*3/uL (ref 150.0–400.0)
RBC: 3.86 Mil/uL — ABNORMAL LOW (ref 4.22–5.81)
RDW: 13.4 % (ref 11.5–15.5)
WBC: 6.3 10*3/uL (ref 4.0–10.5)

## 2021-06-17 LAB — HEPATIC FUNCTION PANEL
ALT: 24 U/L (ref 0–53)
AST: 23 U/L (ref 0–37)
Albumin: 4.2 g/dL (ref 3.5–5.2)
Alkaline Phosphatase: 61 U/L (ref 39–117)
Bilirubin, Direct: 0 mg/dL (ref 0.0–0.3)
Total Bilirubin: 0.4 mg/dL (ref 0.2–1.2)
Total Protein: 7.1 g/dL (ref 6.0–8.3)

## 2021-06-17 MED ORDER — ONDANSETRON HCL 8 MG PO TABS: 8.0000 mg | ORAL_TABLET | Freq: Three times a day (TID) | ORAL | 1 refills | Status: DC | PRN

## 2021-06-17 MED ORDER — PROCHLORPERAZINE MALEATE 10 MG PO TABS: 10.0000 mg | ORAL_TABLET | Freq: Four times a day (QID) | ORAL | 1 refills | Status: DC | PRN

## 2021-06-17 MED ORDER — PREDNISONE 20 MG PO TABS: 60.0000 mg | ORAL_TABLET | Freq: Every day | ORAL | 2 refills | Status: DC

## 2021-06-17 NOTE — Progress Notes (Signed)
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 He tells me that this summer 2018*noticing insidious onset of shortness of breath particularly with walking stairs in the house or [place] Kenneth [name]. This was not that in the previous years. The summer 2018 he was in [place] but did not notice much shortness of breath. Otherwise he is able to do his activities of daily living and played golf with Kenneth [name] car. He is more bothered by his back pain and sciatica as Kenneth [name] result of previous back surgery. Shortness of breath is extremely mild. He says he became more aware of it after the radiologic investigations documented below. He had Kenneth [name] chest x-ray that suggested interstitial findings and therefore he underwent Kenneth [name] high-resolution CT scan of the chest that is described below. I personally visualized this high resolution CT chest and to me it shows bilateral bibasal subpleural reticulation that also extends to the upper lobes without any zonal predominance. There is no obvious honeycombing but there is traction bronchiectasis. There no mediastinal adenopathy. Therefore he is been referred here. In terms of exposure history other than radiation exposure he has not been on any pulmonary toxic drugs or any mold exposure or asbestos exposure. Does have GERD and is on PPI and is well ocntrolled   Does have spring and perennial allergies - sneezes easy. Allergy test with [facility] negative some years ago  [employer] chest physicians interstitial lung disease questionnaire: He says that he does not cough. He is only trouble with dyspnea with strenuous exercise and it started 4 months ago. Past medical history significant for acid reflux. He has never smoked any tobacco or street drugs. Does not have any family history of lung  disease. At home he does not have any humidifier sound birds heart the water damage or mold. 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 He has standard radiation exposure while at work. The standard radiation for Kenneth [name] radiologist. He does not take any pulmonary toxic drugs. House is 79 years old and he worries about mold. He sneezes if air is forced   Pulmonary function test shows isolated reduction in diffusion capacity to 62%. Walking desaturation test underneath her feet 3 laps on room air: Resting pulse ox 96%. Final pulse ox 95%. Heart rate resting was 92/m and went up to 109 minute.   Results for Kenneth [name] Kenneth [name], Kenneth [name] Kenneth [name] (MRN [id number]) as of 03/23/2017 10:04  Ref. Range 03/04/2017 09:13  FVC-Pre Latest Units: L 3.59  FVC-%Pred-Pre Latest Units: % 86  Results for Kenneth [name], Kenneth [name] (MRN [id number]) as of 03/23/2017 10:04  Ref. Range 03/04/2017 09:13  DLCO cor Latest Units: ml/min/mmHg 20.06  DLCO cor % pred Latest Units: % 63    Lungs/Pleura: There is subpleural reticulation with scattered ground-glass and traction bronchiolectasis, without Kenneth [name] definite zonal predominance. No definitive honeycombing. Findings appear mildly progressive when radiographs dating back to 12/28/2008 are reviewed. Probable 5 mm subpleural lymph node along the right major fissure. No pleural fluid. Airway is unremarkable.      IMPRESSION: 1. Pulmonary parenchymal findings of interstitial lung disease which may be due to fibrotic nonspecific interstitial pneumonitis or, given slight progression over time, usual interstitial pneumonitis. 2. Aortic atherosclerosis (ICD10-170.0). Coronary artery calcification. 3. Aortic  aneurysm NOS (ICD10-I71.9). 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 Recommend annual imaging followup by CTA or MRA. This recommendation follows 2010 ACCF/AHA/AATS/ACR/ASA/SCA/SCAI/SIR/STS/SVM Guidelines for the Diagnosis and Management of Patients with  Thoracic Aortic Disease. Circulation. 2010; 121: Q761-P509. 4. Prominence of the right and left main pulmonary arteries can be seen with pulmonary arterial hypertension.     Electronically Signed   By: [name]   On: 02/17/2017 13:41      OV 03/23/2017  Chief Complaint  Patient presents with   Follow-up    SOB on exertion. Other than that he has been doing about the same. Denies any cough or CP.   Here to discuss results. Wife Kenneth [name] Kenneth [name] here. 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 In inteirm no new symptoms.  Autoimmune profile negative. They raise possibility of 2nd opinion.  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 Progression is based on CXR comparison since 2010; there is no CT and is only suspicion of progression  Results for Kenneth [name] Kenneth [name] (MRN [id number]) as of 03/23/2017 10:04  Ref. Range 12/08/2011 16:44 03/04/2017 13:47  Anit Nuclear Antibody(ANA) Latest Ref Range: NEGATIVE   NEG  Angiotensin-Converting Enzyme Latest Ref Range: 9 - 67 U/L  31  Cyclic Citrullin Peptide Ab Latest Units: Units  <16  ds DNA Ab Latest Units: IU/mL  <1  ENA RNP Ab Latest Ref Range: 0.0 - 0.9 AI  0.2  Endomysial Screen Latest Ref Range: NEGATIVE  NEGATIVE   Deamidated Gliadin Abs, IgG Latest Ref Range: <20 U/mL 4.1   Gliadin IgA Latest Ref Range: <20 U/mL 1.8   RA Latex Turbid. Latest Ref Range: <14 IU/mL  <14  Tissue Transglut Ab Latest Ref Range: <20 U/mL 3.9   Tissue Transglutaminase Ab, IgA Latest Ref Range: <20 U/mL 2.2   IgE (Immunoglobulin E), Serum Latest Ref Range: <115 kU/L  31  IgA Latest Ref Range: 68 - 379 mg/dL 132   SSA (Ro) (ENA) Antibody, IgG Latest Ref Range: <1.0 NEG AI   <1.0 NEG  SSB (La) (ENA) Antibody, IgG Latest Ref Range: <1.0 NEG AI   <1.0 NEG    OV 05/06/2017  Chief Complaint  Patient presents with   Follow-up    Follow up today after beginning Esbriet. Pt has no  c/o SOB, cough, or CP. Has had some mild nausea from the [place] but other than that, he has been doing good on it.   ollow-up idiopathic pulmonary fibrosis mild severity  He is here to follow-up He is on the third week of his bed at 3 pills 3 times Kenneth [name] day max dose. So far is tolerating it fine other than mild nausea. He does take after meals. He plans to meet with [facility] coordinator for medication support. He did visit [place] this past weekend and did have vomiting especially after consuming few etoh drinks at Kenneth [name] [employer]. He does apply sunscreen [his daughter is Kenneth [name] Paediatric nurse in [place]. He still complains about the fact that and is 80 year old home when the air conditioner comes on and they air blows out of the duct system he does sneeze 10 times and his feet does feel cold. She plans to put hepa filters that are high and and also get the duct cleaning. He says he did have allergy evaluation and found few years ago and it was negative. Recently he has increased his rpinrole and  alsogot an injection for his back and he does feel better   OV 06/22/2017  Chief Complaint  Patient presents with   Follow-up    Pt still taking Esbriet and has been doing good except has been having issues with nausea x3 days. PFT done today. Still becomes SOB with exertion. Denies any complaints of cough or CP.   IPF followup  Routine followup. Now on esbriet 3 pills tid since end oct 2018. Having new nausea - moderate, x 3 days. Might be chills. But no associated diarrhea or other side effects. Spacing esbriet 4h only. Also has complaitns about high co pay. Unable to find charity. Doing exrcise with trainer - feeels that is better and more vigorous than rehab. Gets tachy with eercise but says he does not desaturate. Had PFT - stable. Lot of questions about disease    Walking desaturation test on 06/22/2017 185 feet x 3 laps on ROOM AIR:  did NOT desaturate. Rest pulse ox was 100%, final pulse ox was 99%.  HR response 84/min at rest to 97/min at peak exertion. Patient WILLSON LIPA  Did not Desaturate < 88% . Kenneth [name] Kenneth [name] did not  Desaturated </= 3% points. Marylyn Ishihara Kenneth [name] Nicholl yes did get tachyardic   OV 09/20/2017  Chief Complaint  Patient presents with   Follow-up    PFT done today.  Pt states he has been doing good. Pt still taking Esbriet and states he is doing good on it.   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  Kenneth [name] Kenneth [name] for IPF follow-up.  Last visit December 2018.  Since then he has been compliant fully with his Pirfenidone (Esbriet).  His nausea resolved with Ginger intake.  However he is having 3-5 pound weight loss and also fatigue towards the end of the day and also low appetite.  He is exercising vigorously 5 times Kenneth [name] week and he is wondering if the fatigue could be because of the heavy exercise.  He is not interested in lung transplantation.  He is open to research participation in interventional trial.  Currently he is on the IPF registry program.  Overall he is well.  He did go to [place] to visit his son and he did not wear sunscreen and he did pick up Kenneth [name] stage I skin burn with erythema that was more than usual.  But this resolved.  I did remind him about his obligation to wear sunscreen at all times with Pirfenidone (Esbriet).  He is interested in interventional research trials.  His pulmonary function test to me on an average is stable even though there is decline in FVC that seems to be an increase in DLCO so that is variability.  His walking desaturation test is around the same. He is interested in rolling over to the larger capsule of the Pirfenidone (Esbriet) so we will have to take it only 3 times Kenneth [name] day. ESberit is cposting him $500/month or more due to high AGI and this is frustrating for him   Walking desaturation test on 09/20/2017 185 feet x 3 laps on ROOM AIR:  did walk briskly all 3 laps with only mild dyspnea desaturate. Rest pulse ox was 98%, final pulse ox was 98%. HR response 96/min at rest  to 107/min at peak exertion. Patient NICHOLAD KAUTZMAN  Did not Desaturate < 88% . Kenneth [name] Kenneth [name] did not  Desaturated </= 3% points. Gu[place] yes did get tachyardic this appears similar to Kenneth [name] few months ago   OV 12/07/2017  Chief  Complaint  Patient presents with   Follow-up    Pt had pre Arlyce Harman and DLCO prior to OV today. Pt has increase of SOB with exertion more with walking up the hill.   Dr. Annamaria Boots presents for follow-up of his IPF to ILD clinic.  In terms of his IPF he feels stable.  In fact he tells me that if not for the incidental chest x-ray and Kenneth [name] subsequent CT scan that picked up pulmonary fibrosis he even to this day will not know that he has IPF.  He only has Kenneth [name] mild exertional dyspnea class I which she always believes in the past that it was because of aging.  At this point in time he is on Esbriet 1 big pill 3 times daily at full dose.  He is now applying sunscreen and has not had any further sunburns.  He is following an extremely intense exercise fitness regimen to improve his cardiac conditioning.  He believes his exertional heart rate and resting heart rate are much improved than before.  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  His main issue is from Pirfenidone [employer]).  He had some mild nausea that has now resolved with ginger capsules but he continues to have some amount of fatigue low appetite and some mild weight loss.  He says this is mild and all tolerable.  The fatigue happens after working Kenneth [name] lot in the yard and he loves yard work.  He wants to know if he can continue to do yard work.  Recently he did have some sinus congestion and took some steroids and antibiotics for it and he feels better but he still has some sinus fullness.  He had spirometry and simple walking desaturation test and the profile is below and I believe these are stable.  In fact his exertional heart rate is improved compared to previous.  Today he also participated in the IPF registry program.  There is Kenneth [name]  noninterventional trial.  He is interested in future interventional trials.   OV 03/08/2018  Chief Complaint  Patient presents with   Follow-up    PFT performed today. Pt states he has been doing well except he has been having problems with nausea and dizziness, and loss of appetite.   Kenneth [name] Kenneth [name] - presents for follow-up. For his IPF. He is on esbruet now. He was tolerating his Pirfenidone (Esbriet) quite well other than minor side effects till last visit. However in thesummer of 2019e's had increased nauseaand also weight loss. He has lost 6 pounds since prior visit. Overall he is lost 11 pounds since September 2018. We started the medication esbeit  anti-fibrotic for him in October 2018. His pants are getting looser.he is also having significant fatigue. Nevertheless he does not want to switch to the of anti-fibrotic ofev because of prior history of irritable bowel syndrome. He says that his baseline irritable bowel syndrome is worse than any side effects he is having with esbriet especially the fatigue.e is open to Kenneth [name] transplant evaluation and consideration of research protocols. His spirometry shows stability with FVC but his DLCO might be declining.Marland Kitchen His simple walk test did not show anychange. He is not noticing any subjective changes in dyspnea on effort tolerance while working out although he does admit to increased dizziness particularly when playing golf. During this time recently has not monitored his pulse ox but in the past and never went below 92%.  Other issues: He had some myalgia despite change in his  statin. He will address this with his primary care. Also CT scan Kenneth [name] year ago showed thoracic aortic dilatiion. Primary care has ordered Kenneth [name] follow-up CT scan.This can be combined with Kenneth [name] high risk CT scan. I I have reached out to  the thoracic radiology     OV 05/10/2018  Subjective:  Patient ID: Kenneth [name] Kenneth [name], male , DOB: 26-May-1941 , age 40 y.o. , MRN: [id number] , ADDRESS: [place] [address]   05/10/2018 -   Chief Complaint  Patient presents with   Follow-up    review spiro with dlco.  c/o stable doe.  On 821m Esbriet TID, notes occ dizziness, loss of appetite.      HPI Kenneth [name] [id number].o. -returns-year follow-up.  Diagnosis made towards the end of 2018.  He has been on Esbriet since then.  His main issues have been weight loss.  He lost 10 pounds of weight at the time of last visit.  At the last visit he weighed 174 pounds in September 2019.  Back in August 2018 he weighed 186 pounds.  All this weight loss happen after he started Pirfenidone (Esbriet).  However he is now stabilized and he continues to be 874 pounds.  He is able to do activities of daily living and exercise but when he does stairs he gets dyspneic.  Overall he feels stable in the last 1 year in terms of his effort tolerance.  His main thing is that he has no appetite.  He has been communicating with the Pirfenidone (Esbriet) supporting about how to manage his low appetite and occasional nausea.  We discussed the options about taking brief holidays or reduction in dose.  He is willing to try this.  He does take occasional ginger.  Of note he had Kenneth [name] transplant referral [facility] because he was out of network.  He tells me that his insurance company only advised that he would have Kenneth [name] higher co-pay so is Kenneth [name] little bit puzzled by this.  Since then he said conversation with his primary care physician and he wants to see Dr. DShana Chutewho is Kenneth [name] son-in-law to his friend Dr. BLindwood Quaretired sPsychologist, sport and exercise  I know Dr. GLauris Chromanquite well from an IPF standpoint.  And therefore wholeheartedly supported the idea.  In terms of his lung function testing his FVC shows Kenneth [name] decline although he is feeling the same.  On miniature walking test he seems Kenneth [name] little more tachycardic but he says he feels the same.  He manages this heart rate with his exercise.  His  DLCO with some variability appears stable.  He had Kenneth [name] CT angiogram for thoracic aortic aneurysm in September 2019 and this when compared to one year earlier has been reported as stable but the different resolutions.  I offered to do Kenneth [name] high-resolution CT chest here but he wants to hold off until he saw Dr. GLauris Chroman  OV 08/23/2018  Subjective:  Patient ID: KWestley Kenneth [name] male , DOB: 426-Oct-1942, age 80y.o. , MRN: [id number], [place]: [id number]  08/23/2018 -   Chief Complaint  Patient presents with   Follow-up    PFT performed today. Pt states he has been doing well since last visit and denies any complaints.   IPF followup"   Diagnosis made towards the end of 2018.  He has been on Esbriet since then.  HPI Kenneth [name] Kenneth [name] 80 y.o. -Dr. Ronny Flurry presents for follow-up of his IPF.  He continues to feel good.  His main issue is tolerating the Esbriet which gives some fatigue.  Sometimes he has to postpone it.  In terms of his dyspnea he continues to exercise.  His dyspnea symptom score is really minimal.  His cough is nonexistent.  He was losing weight with Esbriet but now he is gained his weight back.  In the interim he did see Dr. Shana Chute June 23, 2018 at [facility].  He had Kenneth [name] satisfactory visit.  He thought he was stable.  His next visit with Dr. Lauris Chroman will be in June 2020.  Moving forward he wants to alternate every 6 months between myself and Dr. Lauris Chroman at [facility] which will give him 3 months with 1 pulmonologist or the other.  He is beginning to get interested in research studies.  We did pulmonary function test on him today and is documented below.  The FVC appears Kenneth [name] fluctuant but the DLCO definitely appears on Kenneth [name] downward trend.  The new DLCO is on Kenneth [name] global lung initiated [GL I] equation and therefore the percent predicted is higher.  But the absolute value showed downward trend line.  We went over this.  He definitely does not feel this subjectively.  His  last high-resolution CT scan of the chest was in August of 2018.  He is willing to have one at follow-up.  He wants to see me again in 6 months.  His simple walking desaturation test appears stable.  He is thinking of making Kenneth [name] trip to [place] and wants to know if he should wear Kenneth [name] mask.    OV 04/21/2019  Subjective:  Patient ID: Kenneth [name] Kenneth [name], male , DOB: 09/24/1940 , age 69 y.o. , MRN: [id number] , ADDRESS: [place] [id number]   04/21/2019 -   Chief Complaint  Patient presents with   Follow-up    Patient reports that his breathing is doing well at this time.    IPF followup. On esbriet  HPI Kenneth [name] Kenneth [name] 80 y.o. -presents for follow-up of his idiopathic pulmonary fibrosis.  Last visit was pre-pandemic in February 2018.  In the interim he did see Dr. Shana Chute at [name] and deemed to be stable.  At this point in time he is reporting continued stability with the symptoms as seen by the score below.  However I did notice that he is lost lot of weight.  His BMI with his clothes on is 25 with Kenneth [name] weight of 170 pounds.  He says his dry weight is actually around 162 pounds.  He feels that this is the stable weight in the last 6 months.  He says he is not bothered by it.  He says this is because of low appetite because of the and pirfenidone.  He feels the pirfenidone is benefiting him and he continues to be is to be stable.  In fact his pulmonary function test shows " improvement".  He said that he felt the test today was somewhat variable and is effort based on the technician.  He had Kenneth [name] recent high-resolution CT chest that shows continued stability.  I personally visualized this film.  At this point in time he is happy taking the pirfenidone.  But he did agree if there is further weight loss into the 150s pounds then we could reassess  Had Kenneth [name] lot of discussion about COVID-19 and about appropriate risk  reduction measures which he is continuing to do.  He did some read some  literature about COVID-19 and had questions about it. He in Vit D supplementation and is agreeable to get his levels checked following literature about protective effect of Vit D  Other than that he reports 2 episodes of dizziness and in one of those he nearly passed out.  He was wondering if some of the symptoms were related to ropinirole and therefore he has stopped it.  I reviewed the literature with him and the significant amount of dizziness and hypotension.  And also GI symptoms which can probably accentuated with pirfenidone.   OV 10/16/2019  Subjective:  Patient ID: Kenneth [name] Kenneth [name], male , DOB: June 04, 1941 , age 65 y.o. , MRN: [id number] , ADDRESS: [place] [id number]   10/16/2019 -   Chief Complaint  Patient presents with   Follow-up    PFT performed 3/31.  Pt states he is doing okay. States his breathing is about the same since last visit.     HPI Kenneth [name] Kenneth [name] 80 y.o.  -follow-up IPF on pirfenidone    -ROS   80-monthfollow-up for Dr. YAnnamaria Boots  He continues on pirfenidone.  He is taking full dose.  In the interim he has had hernia surgery 6 weeks ago for right inguinal hernia.  This is gone well.  He is change his exercise from bicycle to walking because of the hernia.  His symptom score is the same.  In January 2021 [facility].  Deemed to be stable.  I reviewed that note.  After that he has had pulmonary function test with uKoreacouple weeks ago.  This shows Kenneth [name] decline.  He says the quality of instructions was poor and therefore the decline is because of that.  At least that is his suspicion.  He feels well.  Walking desaturation test is stable.  He has gained some weight.  He continues to have some nausea and fatigue and diarrhea with the pirfenidone.  But this is stable and manageable.  He feels his irritable bowel syndrome is active because of the pirfenidone.  He plans to see Dr. JJoanell Risingfor that.  His last liver  function test was with Dr. GLauris Chromanin January 2021.  This was normal per the note.  His last CT scan was towards the end of 2020 and deemed to be stable.     OV 04/11/2020   Subjective:  Patient ID: KWestley Kenneth [name] male , DOB: [birth year] age 80y.o. years. , MRN: [id number]  ADDRESS[place] [id number]  PCrist Infante MD Providers : Dr GAlisia Ferrari Treatment Team:  Attending Provider: RBrand Males MD   Chief Complaint  Patient presents with   Follow-up    PFT performed today.  Pt states he has been doing well since last visit and denies any complaints.    Follow-up idiopathic pulmonary fibrosis.  Diagnosis towards the end of 2018.  On pirfenidone.  Last CT scan of the chest September 2020.   HPI KJAKIM [id number].o. -presents for follow-up last seen in April 2021.  After that in July 2021 he [facility]  I reviewed the notes.  The feeling was that he was stable.  He is only minimally symptomatic.  He had Kenneth [name] 6-minute walk test and did really well without any significant desaturations.  He  is now resorted to high intensity interval training based on advice from Kenneth [name] physical therapist/gym instructor.  He feels like stronger and failure.  He also states that his appetite is better and is eating better.  He feels his weight is stable.  In terms of his IPF symptoms is minimal and documented below.  He just has mild irritable bowel syndrome with his Esbriet and baseline health.  His walking desaturation test today in the office is stable.  His pulmonary function test was reviewed.  Over the last 3 years it shows Kenneth [name] slight steady decline.  This decline is both in FVC and DLCO.  He is averaging around 2.6% decline per year with FVC while 8% decline in the DLCO per year.  On an average with Kenneth [name] 3% decline per year in the last 3 years.  We did discuss this.  We discussed #pulmonary fibrosis foundation support group -he will visit the  website.  He gets newsletters from them.  At this point in time he is not interested in joining the support group but he did get the contact information for this and will reach out if he desires so.  #-We also discussed research as Kenneth [name] care option -we discussed trial sponsored by [place] [facility] of pirfenidone.  The trial is called [employer].  The investigation medical product is called PRM-151 in the axilla macrophage pathway.  Promising phase 2 data.  Research team gave him the consent form under my delegation.  He understands research is voluntary and is to evaluate unproven therapies.  He understands that the primary intent is to contribute to her scientific development and therapeutic gain is secondary because of an approved product the risks benefit ratio is very different compared to blood products.  He is going to read the consent and think about potential participation.  I sent him via email medical journal articles about the investigational product    He tells me that he feels his weight is stable.  His dry weight early in the morning is between 162-163 pounds.  I pointed out that his weight today is 168 pounds and is lower than before.  However he says that the difference rating 168-172 is related to clothing and his weight at home is stable.  He is not concerned about the slight weight loss.  Overall while he agrees he is lost weight since starting Esbriet he feels recently has been stable.  He wants to continue to monitor the situation  Irritable bowel: He said he will check with Dr. 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 Range 03/04/2017 09:13 06/22/2017 09:14 09/20/2017 08:54 12/07/2017 13:49 03/08/2018 10:15 05/10/2018 13:59 08/23/2018 09:29 04/21/2019 11:06 10/04/2019 09:32  04/11/20 10/21/20  FVC-Pre Latest Units: L 3.59 3.70 3.47 3.54 3.56 3.27 3.19 3.59 3.35 3.26 3.2  FVC-%Pred-Pre Latest Units: % 86 89 84 86 86 84 82 90 83 82% 81%  Results for Kenneth [name] Kenneth [name], Kenneth [name] Kenneth [name] (MRN [id number]) as of 10/16/2019 09:07  Ref. Range 03/04/2017 09:13 06/22/2017 09:14 09/20/2017 08:54 12/07/2017 13:49 03/08/2018 10:15 05/10/2018 13:59 08/23/2018 09:29 04/21/2019 11:06 10/04/2019 09:32 04/11/20 10/21/20  DLCO unc Latest Units: ml/min/mmHg 20.12 19.77 24.06 21.20 19.63 19.04 18.61 20.89 16.69 17.26 13.6  DLCO unc % pred Latest Units: %  63 62 76 66 62 64 79 86 69 72% 56%   OV 10/22/2020  Subjective:  Patient ID: Kenneth [name] Kenneth [name], male , DOB: 1941/06/02 , age 51 y.o. , MRN: [id number] , ADDRESS: [address] [id number] PCP [name], MD Patient Care Team: [name], MD as PCP - General (Internal Medicine) Cira Rue, RN as Oncology Nurse Navigator  This Provider for this visit: Treatment Team:  Attending Provider: Brand Males, MD    10/22/2020 -   Chief Complaint  Patient presents with   Follow-up    Fatigue from radiation treatments, more winded and more GI upset from radiation treatments    Follow-up idiopathic pulmonary fibrosis.  Diagnosis towards the end of 2018.  On pirfenidone.  Last CT scan of the chest September 2020.    HPI Kenneth [name] Kenneth [name] 80 y.o. -returns for follow-up.  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  He is finished hormonal treatment and now is on radiation treatment.  Radiation treatment is aggravating his GI side effects from Pirfenidone (Esbriet) and from irritable bowel syndrome.  Is also causing fatigue.  He went August Kenneth [name] masters in walk the tournament.  This made him more fatigued than what he experienced few years ago.  He said it took him Kenneth [name] few days to recover.  He used to have weight loss but this is now stabilized it is documented below.  His symptom severity score and his walking desaturation test are stable but his pulmonary  function test continues to show decline.  His DLCO shows Kenneth [name] relatively more decline but he said there were technique issues today.  He is got upcoming appointment with ILD clinic at [facility] in July.  At this point in time he is interested in phase 3 clinical trials.  We have quite Kenneth [name] few to off as of summer 2022 but his prostate cancer could be an exclusion.  We will have the research team evaluate.  He is saddened by the loss of his friend Dr. March Rummage.  He is evaluating whether he should join the pulmonary fibrosis foundation at the national level to be Kenneth [name] board member.     OV 04/03/2021  Subjective:  Patient ID: Kenneth [name] Kenneth [name], male , DOB: [birth year] , age 67 y.o. , MRN: [id number] , ADDRESS: [place] [id number] PCP [name], MD Patient Care Team: [name], MD as PCP - General (Internal Medicine) Cira Rue, RN as Oncology Nurse Navigator  This Provider for this visit: Treatment Team:  Attending Provider: Brand Males, MD    04/03/2021 -   Chief Complaint  Patient presents with   Follow-up    Just performed Kenneth [name] partial PFT, patient states increased SOB.    Follow-up idiopathic pulmonary fibrosis.  Diagnosis towards the end of 2018.  On pirfenidone.  Last CT scan of the chest September 2020. - > sept 2022  Underlying irritable bowel syndrome  Initial weight loss with pirfenidone and then stabilized  HPI Kenneth [name] Kenneth [name] 80 y.o. -Dr. Annamaria Boots presents for his IPF follow-up.  Since I last saw him he has had several health issues.  He got diagnosed with prostate cancer and then had radiation to that.  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  He is going to have Kenneth [name] colonoscopy.  He feels his irritable bowel syndrome pirfenidone and ultimately the radiation made his GI symptoms significantly worse.  In order to combat  the fatigue he has been undergoing physical therapy.  He also try to get back to golf and  took Kenneth [name] golf swing for practice and then states that he had Kenneth [name] compression fracture in the spine.  This is probably also because of years of steroid injections in his back.  In addition when he had Kenneth [name] high-resolution CT chest in September 2022 this month there was some neck nodes and he has had Kenneth [name] neck CT scan followed by ENT visit to [name].  Apparently fine-needle aspiration was nondiagnostic.  He is scheduled to undergo excision biopsy.  The combination of all this is gotten Kenneth [name] little bit more anxious and depressed.  As you can see his symptom scores for depression and anxiety are up.  He is also feeling Kenneth [name] little more short of breath than usual.  He says his pulse ox is in the low 90s when he exerts himself but it still adequate.  Review of his symptoms and objective basis shows his symptom score to be the same.  His walking desaturation test is also the same.  His weight is stable.  His pulmonary function test shows stability in diffusion but Kenneth [name] decline in FVC compared to earlier this year.  Had Kenneth [name] high-resolution CT chest that only shows minimal progression fibrosis over 2 years.  Echocardiogram September 2022 did not show any evidence of pulmonary hypertension     CLINICAL DATA:  Pulmonary fibrosis.   EXAM: CT CHEST WITHOUT CONTRAST   TECHNIQUE: Multidetector CT imaging of the chest was performed following the standard protocol without intravenous contrast. High resolution imaging of the lungs, as well as inspiratory and expiratory imaging, was performed.   COMPARISON:  03/28/2019, 03/30/2018 and 02/27/2017. Bone scan 05/21/2020.   FINDINGS: Cardiovascular: Atherosclerotic calcification of the aorta, aortic valve and coronary arteries. Enlarged pulmonic trunk and right and left main pulmonary arteries. Heart is at the upper limits of normal in size. No pericardial effusion.   Mediastinum/Nodes: Low left internal jugular lymph nodes measure up to 8 mm (2/8), previously 3 mm on  03/28/2019. Mediastinal lymph nodes are subcentimeter in short axis size, as before. Hilar regions are difficult to definitively evaluate without IV contrast. Left axillary lymph nodes measure up to 9 mm, are unchanged and not considered enlarged by CT size criteria. High left subpectoral lymph node measures 7 mm (2/11), increased from 4 mm on 03/28/2019. No right axillary adenopathy. Esophagus is grossly unremarkable. Periesophageal lymph nodes are subcentimeter in short axis size and appears similar.   Lungs/Pleura: Peripheral and basilar predominant subpleural reticulation, ground-glass and traction bronchiectasis/bronchiolectasis, minimally progressive from 03/28/2019. Evidence of progression is best seen in the lower lung zones. No pleural fluid. Airway is unremarkable. No air trapping.   Upper Abdomen: Visualized portions of the liver, gallbladder, adrenal glands, kidneys, spleen, pancreas, stomach and bowel are unremarkable.   Musculoskeletal: Degenerative changes in the spine. There is Kenneth [name] new small rounded sclerotic lesion in the lateral aspect of the right third rib (sagittal image 32). New lucent lesion in the left posterolateral aspect of the T8 vertebral body measuring approximately 1.3 x 1.7 x 2.1 cm (2/84 and sagittal image 96). This likely corresponds to abnormal uptake seen on bone scan 05/21/2020. Finding is new from 03/28/2019. Slight compression of the T12 superior endplate is new.   IMPRESSION: 1. Slight interval progression of pulmonary fibrosis. Findings are consistent with UIP per consensus guidelines: Diagnosis of Idiopathic Pulmonary Fibrosis: An Official ATS/ERS/JRS/ALAT Clinical Practice Guideline. [place]  Med Vol 198, Iss 5, [id number], Mar 06 2017. 2. New small sclerotic lesion in the lateral aspect of the right third rib, worrisome for metastatic prostate cancer. 3. Lucent lesion in the left posterolateral aspect of the T8 vertebral body  corresponds to abnormal uptake on bone scan 05/21/2020, and is new from CT chest 03/28/2019, raising suspicion for malignancy. 4. Left internal and high left subpectoral lymph nodes are subcentimeter in size but appear slightly larger than on 03/28/2019. Difficult to exclude Kenneth [name] lymphoproliferative disorder. Please refer to CT neck with contrast done the same day in further evaluation. 5. Slight compression of the T12 superior endplate is new but age indeterminate. 6. Aortic atherosclerosis (ICD10-I70.0). Coronary artery calcification. 7. Enlarged pulmonary arteries, indicative of pulmonary arterial hypertension.     Electronically Signed   By: [name]   On: 03/19/2021 15:12   Xxxxx  IMPRESSION: Neck CT September 2022 Mildly prominent lymph nodes in the neck bilaterally left greater than right. Cluster of left supraclavicular and left posterior lymph nodes all under 1 cm.   Largest lymph node in the neck is Kenneth [name] right level 3 lymph node measuring 12 mm on axial image 75.     Electronically Signed   By: Franchot Gallo M.D.   On: 03/19/2021 15:24   Xxxx IMPRESSIONS echocardiogram September 2022    1. Left ventricular ejection fraction, by estimation, is 65 to 70%. The  left ventricle has normal function. The left ventricle has no regional  wall motion abnormalities. Left ventricular diastolic parameters are  consistent with Grade I diastolic  dysfunction (impaired relaxation).   2. Right ventricular systolic function is normal. The right ventricular  size is normal. Tricuspid regurgitation signal is inadequate for assessing  PA pressure.   3. Left atrial size was mildly dilated.   4. The mitral valve is abnormal. Trivial mitral valve regurgitation. No  evidence of mitral stenosis.   5. The aortic valve is tricuspid. There is mild calcification of the  aortic valve. There is moderate thickening of the aortic valve. Aortic  valve regurgitation is not visualized.  Mild aortic valve stenosis. Aortic  valve area, by VTI measures 1.62 cm.  Aortic valve mean gradient measures 12.0 mmHg. Aortic valve Vmax measures  2.31 m/s. DI is 0.43.   6. Aortic dilatation noted. There is borderline dilatation of the aortic  root, measuring 39 mm. There is borderline dilatation of the ascending  aorta, measuring 39 mm.   7. The inferior vena cava is normal in size with <50% respiratory  variability, suggesting right atrial pressure of 8 mmHg.   Comparison(s): Changes from prior study are noted. 03/02/2017: LVEF 60-65%,  mild LVH, grade 1 DD, aortic root 39 mm, no aortic stenosis.   OV 06/17/2021  Subjective:  Patient ID: Kenneth [name] Kenneth [name], male , DOB: 1940-10-14 , age 17 y.o. 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    Follow-up idiopathic pulmonary fibrosis.  Diagnosis towards the end of 2018.  On pirfenidone.  Last CT scan of the chest September  2020. - > sept 2022  Underlying irritable bowel syndrome  Initial weight loss with pirfenidone and then stabilized  History of prostate cancer  New diagnosis of B-cell lymphoma cervical lymphadenopathy -late 2022  Multifactorial fatigue  - mild anemia OCt 2022  - mild hypontermia OCt 2022   HPI Kenneth [name] Kenneth [name] 80 y.o. 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  Since his last visit from ILD standpoint he continues to be stable.  His walking desaturation test is stable.  However he is reporting more shortness of breath but this is directly related to more fatigue.  Review of the labs  indicate in October 2022 he had mild anemia and also mild hyponatremia.  In the past radiation therapy for prostate made him extremely tired.  He is also on pirfenidone that can cause tiredness.  He now has lymphoma also that can cause tiredness.  He has upcoming visit in January 2022 with [place] [facility] clinic    In the interim since last visit he had Kenneth [name] cervical lymphadenopathy excision biopsy that shows B-cell lymphoma.  He seen Dr. Kavin Leech.  They are planning to start R-CHOP treatment after Christmas.  He is frustrated by the recurrence of various medical illnesses.  The second cancer since his IPF diagnosis.  In terms of his IPF: He is now on generic pirfenidone.  He feels the nausea is worse.  He wants to switch to branded Esbriet.  Gave him donor sample of Esbriet by [employer].  Lot number [id number] with expiration June 2025 [unopened bottle], lot number [id number] with expiration 05/2023 [unopened bottle and also lot number [id number] with expiration 12/2023 [partial bottle].  He is asked me to send Kenneth [name] message to the pharmacist to make the switch.  He says even if it is more expensive he will play for the branded Esbriet because it is better quality of life.  He is worried that nausea and other side effects will get worse particularly once he starts chemotherapy.    SYMPTOM SCALE - ILD 08/23/2018  04/21/2019  10/16/2019  04/11/2020 168# 10/22/2020 170# - esbriet and XRT for prostate 04/03/2021 168# 06/17/2021 168#  O2 use ra e ra   ra ra  Shortness of Breath 0 -> 5 scale with 5 being worst (score 6 If unable to do)        At rest 0 0 0 0 0 0 0  Simple tasks - showers, clothes change, eating, shaving *0** 0 0 0 0 0 0  Household (dishes, doing bed, laundry) 0 0 0 0 0 1 1  Shopping 0 0 0 0 0 0 1  Walking level at own pace 0 0 0 0 0 0 1  Walking up Stairs _0 Total (40 - 48) Dyspnea Score _1 How bad is your cough? 0 0 0 0 0 0 1  How bad is your fatigue 1 due to esbriet 0 2 0 2  1 3.5  nausea   1 0 _2 vomit   0 0 0 0 0  diarrhea   _3 anxiety   0 0 0 1 1  depresso0   0 0 0 1 1        Simple office walk 185 feet x  3 laps goal with forehead probe 12/07/2017 Weight 180# 03/08/2018 Weight 174.9 05/10/2018 Weight 174# 08/23/2018 Weight 178# 04/21/2019 Weight 170# 10/16/2019 173# 04/11/2020 168# 10/22/2020 170#  04/03/2021 168# 06/17/2021 168#  O2 used _0  Room air   ra ra  Number laps completed _1 Comments about pace Moderate pace Mod pace Mod fast pace fast  fast   fast   Resting Pulse Ox/HR 100% and 83/min 100% ad 85/min 100% and 91/min 100% and 82/min  100% asnd 91 100% and 83/min 98% and 94/min 100% and 82 100% and 93  Final Pulse Ox/HR 99% and 98/min 98$ and 104/min 98% and 108/min 98% and 100/min  96% and 106/mom 98% and 96/mn 95% and 110.min 97% and 105 97% ad 109  Desaturated </= 88% no no no no  no      Desaturated <= 3% points no no ni no  yesm 4 points  Yes, 3 points Yes, 3 poitn Yes 3 pomnts  Got Tachycardic >/= 90/min yes yes yes yes  yes  yes yes yes  Symptoms at end of test No complaints No complaints  No complaints  Mild dyspnea  no Mild dyspnea Mild dyspnea  Miscellaneous comments x ? sligh increase in tachy/stable ? incraeased tachty       Stable since April 2021    CT Chest data  No results found.    PFT  PFT Results Latest Ref Rng & Units 04/03/2021 10/21/2020 04/11/2020 10/04/2019 04/21/2019 08/23/2018 05/10/2018  FVC-Pre L 2.96 3.20 3.26 3.35 3.59 3.19 3.27  FVC-Predicted Pre % 75 81 82 83 90 82 84  Pre FEV1/FVC % % 81 81 80 82 81 83 82  FEV1-Pre L 2.38 2.59 2.62 2.74 2.92 2.64 2.67  FEV1-Predicted Pre % 85 91 92 95 102 94 96  DLCO uncorrected ml/min/mmHg 16.15 13.64 17.26 16.69 20.89 18.61 19.04  DLCO UNC% % 67 56 72 69 86 79 64  DLCO corrected ml/min/mmHg 16.15 13.64 17.26 17.31 - - -  DLCO COR %Predicted % 67 56 72 71 - - -  DLVA Predicted % 91 80 94 91 109 100 88        has Kenneth [name] past medical history of Arthritis, Diverticulosis of colon (without mention of hemorrhage), GERD (gastroesophageal reflux disease), Hiatal hernia, HLD (hyperlipidemia), Internal hemorrhoids without mention of complication, Irritable bowel syndrome, Other specified gastritis without mention of hemorrhage, Prostate cancer ([place]), Pulmonary fibrosis ([place]), and Stress fracture (02/2021).   reports that he has never smoked. He has never used smokeless tobacco.  Past Surgical History:  Procedure Laterality Date   CARPAL TUNNEL RELEASE     left   CATARACT EXTRACTION  06/2011   bilateral   DEEP NECK LYMPH NODE BIOPSY / EXCISION  03/25/2021   INGUINAL HERNIA REPAIR Right 09/05/2019   Procedure: OPEN REPAIR RIGHT INGUINAL HERNIA WITH MESH;  Surgeon: Armandina Gemma, MD;  Location: [facility];  Service: General;  Laterality: Right;   [place]   x 2   LUMBAR LAMINECTOMY/DECOMPRESSION MICRODISCECTOMY Left 07/11/2013   Procedure: LUMBAR ONE TO TWO, LUMBAR TWO TO THREE, LUMBAR THREE TO FOUR LUMBAR LAMINECTOMY/DECOMPRESSION MICRODISCECTOMY 3 LEVELS;  Surgeon: Charlie Pitter, MD;  Location: [facility];  Service: Neurosurgery;  Laterality: Left;   NASAL SINUS SURGERY      No Known Allergies  Immunization History  Administered Date(s) Administered   Fluad Quad(high Dose 65+) 03/18/2020   Influenza Split 10/03/2009, 03/28/2010, 04/14/2011, 03/22/2012, 03/09/2013, 03/28/2014   Influenza, High Dose Seasonal PF 03/19/2016, 03/06/2017, 04/09/2018, 03/07/2019, 04/06/2020  Influenza, Quadrivalent, Recombinant, Inj, Pf 03/12/2018, 03/10/2019, 04/06/2020   Influenza,inj,Quad PF,6+ Mos 04/04/2014, 03/26/2015, 04/05/2016   Influenza-Unspecified 04/05/2016, 04/05/2021   PFIZER Comirnaty(Gray Top)Covid-19 Tri-Sucrose Vaccine 10/15/2020   PFIZER(Purple Top)SARS-COV-2 Vaccination 07/20/2019, 08/10/2019, 02/27/2020, 03/23/2021   Pneumococcal Conjugate-13 03/30/2013, 07/31/2013,  04/26/2018   Pneumococcal Polysaccharide-23 10/03/2009, 01/14/2010, 03/31/2018, 02/18/2021   Pneumococcal-Unspecified 04/05/2016   Td 10/20/2016   Tdap 10/03/2009   Zoster Recombinat (Shingrix) 01/03/2017   Zoster, Live 10/03/2009    Family History  Problem Relation Age of Onset   Heart failure Father 60   Bladder Cancer Mother    Breast cancer Mother    Colon cancer Neg Hx    Pancreatic cancer Neg Hx    Rectal cancer Neg Hx    Stomach cancer Neg Hx    Prostate cancer Neg Hx      Current Outpatient Medications:    acetaminophen-codeine (TYLENOL #3) 300-30 MG per tablet, Take 1 tablet by mouth every 8 (eight) hours as needed for moderate pain., Disp: , Rfl:    aspirin EC 81 MG tablet, Take 81 mg by mouth 3 (three) times Kenneth [name] week., Disp: , Rfl:    Bacillus Coagulans-Inulin (ALIGN PREBIOTIC-PROBIOTIC PO), Take by mouth., Disp: , Rfl:    carisoprodol (SOMA) 350 MG tablet, Take 175 mg by mouth 3 (three) times daily as needed for muscle spasms., Disp: , Rfl:    Cholecalciferol (VITAMIN D-3) 1000 UNITS CAPS, Take 2,000 Units by mouth every evening., Disp: , Rfl:    CO-ENZYME Q-10 PO, Take 1 tablet by mouth every evening. , Disp: , Rfl:    Cyanocobalamin (VITAMIN B12) 1000 MCG TBCR, Take 1,000 mcg by mouth daily., Disp: , Rfl:    denosumab (PROLIA) 60 MG/ML SOSY injection, Inject 60 mg into the skin every 6 (six) months. At [place], Disp: , Rfl:    diclofenac (VOLTAREN) 75 MG EC tablet, Take 75 mg by mouth 2 (two) times daily as needed (pain.)., Disp: , Rfl: 2   diclofenac sodium (VOLTAREN) 1 % GEL, Apply 4 g topically 4 (four) times daily as needed (pain.)., Disp: , Rfl: 12   dicyclomine (BENTYL) 10 MG capsule, Take one by mouth every 6 hours as needed for abdominal cramping (Patient taking differently: Take 10 mg by mouth every 6 (six) hours as needed (abdominal spasms.).), Disp: 30 capsule, Rfl: 1   diphenoxylate-atropine (LOMOTIL) 2.5-0.025 MG tablet, Take 1 tablet by mouth 4  (four) times daily as needed for diarrhea or loose stools., Disp: , Rfl:    esomeprazole (NEXIUM) 40 MG capsule, Take 40 mg by mouth every evening. , Disp: , Rfl:    ezetimibe (ZETIA) 10 MG tablet, Take 10 mg by mouth daily., Disp: , Rfl:    Fluticasone Propionate (XHANCE) 93 MCG/ACT EXHU, 1 spray in each nostril, Disp: , Rfl:    Glucosamine Sulfate 500 MG TABS, Take 500 mg by mouth daily., Disp: , Rfl:    HYDROcodone-acetaminophen (NORCO/VICODIN) 5-325 MG tablet, Take 1-2 tablets by mouth every 4 (four) hours as needed for moderate pain., Disp: 24 tablet, Rfl: 0   leuprolide, 6 Month, (ELIGARD) 45 MG injection, Inject 45 mg into the skin every 6 (six) months. Per Dr. Tresa Moore office, Disp: , Rfl:    loratadine (CLARITIN) 10 MG tablet, Take 10 mg by mouth daily., Disp: , Rfl:    meclizine (ANTIVERT) 25 MG tablet, Take 25 mg by mouth 3 (three) times daily as needed for dizziness. , Disp: , Rfl:    methocarbamol (ROBAXIN)  500 MG tablet, take 1 tab 4 times daily prn spasm, Disp: , Rfl:    Multiple Vitamin (MULTIVITAMIN) tablet, Take 1 tablet by mouth daily., Disp: , Rfl:    ondansetron (ZOFRAN) 8 MG tablet, Take 1 tablet (8 mg total) by mouth every 8 (eight) hours as needed for nausea or vomiting. Start 72 hours after IV chemotherapy administration, Disp: 30 tablet, Rfl: 1   Pirfenidone (ESBRIET) 801 MG TABS, Take 1 tablet by mouth 3 (three) times daily with meals., Disp: 270 tablet, Rfl: 1   predniSONE (DELTASONE) 20 MG tablet, Take 3 tablets (60 mg total) by mouth daily with breakfast. Take on day 2 through day 5 of each chemo cycle (4 days), Disp: 24 tablet, Rfl: 2   prochlorperazine (COMPAZINE) 10 MG tablet, Take 1 tablet (10 mg total) by mouth every 6 (six) hours as needed for nausea., Disp: 60 tablet, Rfl: 1      Objective:   Vitals:   06/17/21 1126  BP: 112/62  Pulse: 92  Temp: 97.8 F (36.6 C)  TempSrc: Oral  SpO2: 97%  Weight: 168 lb 3.2 oz (76.3 kg)  Height: _0  (1.753 m)     Estimated body mass index is 24.84 kg/m as calculated from the following:   Height as of this encounter: _1  (1.753 m).   Weight as of this encounter: 168 lb 3.2 oz (76.3 kg).  _2 @  Filed Weights   06/17/21 1126  Weight: 168 lb 3.2 oz (76.3 kg)     Physical Exam = General: No distress. Looks well Neuro: Alert and Oriented x 3. GCS 15. Speech normal Psych: Pleasant Resp:  Barrel Chest - no.  Wheeze - no, Crackles - yes R > L base, No overt respiratory distress CVS: Normal heart sounds. Murmurs - no Ext: Stigmata of Connective Tissue Disease - no HEENT: Normal upper airway. PEERL +. No post nasal drip        Assessment:       ICD-10-CM   1. 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HCC)  Z61.096 Pulmonary function test    CBC with Differential/Platelet    Basic metabolic panel    Basic metabolic panel    CBC with Differential/Platelet    Hepatic function panel    2. 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 Weight is stble  Plan -Check liver function test today 06/17/2021 (last sept 2022)  -Keep pulse ox greater than 88% at all times - Continue pirfenidone but change back to ESBRIET - . 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 [name] or sooner if needed  -ILD symptom score and walking desaturation test at follow-up  -Seeing you sooner because of multiple issues that are ongoing with your health  -     SIGNATURE    Dr. Brand Males, M.D., F.C.C.P,  Pulmonary and Critical Care Medicine Staff Physician, [place] Director - Interstitial Lung Disease  Program  Pulmonary [place] at [place], [place], [address]  Pager: [phone], If no answer or between  15:00h - 7:00h: call [phone] Telephone: [phone]  12:19 PM 06/17/2021

## 2021-06-17 NOTE — Telephone Encounter (Signed)
Received notification from Dr. [name] that patient discussed wanting to switching back to brand Esbriet.  Unable to run test claim since generic was filled for 90 day supply. Advised patient that we can send refill for brand name Esbriet to [facility] but after labs from today have resulted  Knox Saliva, PharmD, MPH, BCPS Clinical Pharmacist (Rheumatology and Pulmonology)

## 2021-06-17 NOTE — Progress Notes (Signed)
Sent scripts for anti-emetics and Prednisone to [facility]. ECHO 03/11/21 shows LEF 65-70%. Per Dr. [name], no need to repeat at this time.

## 2021-06-17 NOTE — Telephone Encounter (Signed)
Called and spoke with pt letting him know the results of labwork and he verbalized understanding. Nothing further needed.

## 2021-06-17 NOTE — Addendum Note (Signed)
Addended by: [name] B on: 06/17/2021 11:35 AM   Modules accepted: Orders

## 2021-06-17 NOTE — Progress Notes (Signed)
START ON PATHWAY REGIMEN - Lymphoma and CLL     A cycle is every 21 days:     Prednisone      Rituximab-xxxx      Cyclophosphamide      Doxorubicin      Vincristine   **Always confirm dose/schedule in your pharmacy ordering system**  Patient Characteristics: Diffuse Large B-Cell Lymphoma or Follicular Lymphoma, Grade 3B, First Line, Stage III and IV Disease Type: Not Applicable Disease Type: Diffuse Large B-Cell Lymphoma Disease Type: Not Applicable Line of therapy: First Line Intent of Therapy: Curative Intent, Discussed with Patient

## 2021-06-17 NOTE — Patient Instructions (Addendum)
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 Weight is stble  Plan -Check liver function test today 06/17/2021 (last sept 2022)  -Keep pulse ox greater than 88% at all times - Continue pirfenidone but change back to ESBRIET - . 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 [name] or sooner if needed  -ILD symptom score and walking desaturation test at follow-up  -Seeing you sooner because of multiple issues that are ongoing with your health  -

## 2021-06-17 NOTE — Telephone Encounter (Signed)
Mild low hemoglobin 12.3 g%.  But it is stable.  Liver function test is fine.  Please let Dr. Annamaria Boots know    LABS    PULMONARY No results for input(s): PHART, PCO2ART, PO2ART, HCO3, TCO2, O2SAT in the last 168 hours.  Invalid input(s): PCO2, PO2  CBC Recent Labs  Lab 06/17/21 1219  HGB 12.3*  HCT 36.2*  WBC 6.3  PLT 212.0    COAGULATION No results for input(s): INR in the last 168 hours.  CARDIAC  No results for input(s): TROPONINI in the last 168 hours. No results for input(s): PROBNP in the last 168 hours.   CHEMISTRY Recent Labs  Lab 06/17/21 1219  NA 135  K 4.4  CL 100  CO2 30  GLUCOSE 89  BUN 13  CREATININE 0.63  CALCIUM 9.3   Estimated Creatinine Clearance: 74.9 mL/min (by C-G formula based on SCr of 0.63 mg/dL).   LIVER Recent Labs  Lab 06/17/21 1219  AST 23  ALT 24  ALKPHOS 61  BILITOT 0.4  PROT 7.1  ALBUMIN 4.2     INFECTIOUS No results for input(s): LATICACIDVEN, PROCALCITON in the last 168 hours.   ENDOCRINE CBG (last 3)  No results for input(s): GLUCAP in the last 72 hours.       IMAGING x48h  - image(s) personally visualized  -   highlighted in bold No results found.

## 2021-06-18 ENCOUNTER — Inpatient Hospital Stay: Payer: PPO

## 2021-06-18 DIAGNOSIS — R591 Generalized enlarged lymph nodes: Secondary | ICD-10-CM

## 2021-06-18 DIAGNOSIS — C61 Malignant neoplasm of prostate: Secondary | ICD-10-CM | POA: Diagnosis not present

## 2021-06-18 LAB — CBC WITH DIFFERENTIAL (CANCER CENTER ONLY)
Abs Immature Granulocytes: 0.01 10*3/uL (ref 0.00–0.07)
Basophils Absolute: 0 10*3/uL (ref 0.0–0.1)
Basophils Relative: 0 %
Eosinophils Absolute: 0.3 10*3/uL (ref 0.0–0.5)
Eosinophils Relative: 5 %
HCT: 35.5 % — ABNORMAL LOW (ref 39.0–52.0)
Hemoglobin: 11.9 g/dL — ABNORMAL LOW (ref 13.0–17.0)
Immature Granulocytes: 0 %
Lymphocytes Relative: 10 %
Lymphs Abs: 0.5 10*3/uL — ABNORMAL LOW (ref 0.7–4.0)
MCH: 31.3 pg (ref 26.0–34.0)
MCHC: 33.5 g/dL (ref 30.0–36.0)
MCV: 93.4 fL (ref 80.0–100.0)
Monocytes Absolute: 0.6 10*3/uL (ref 0.1–1.0)
Monocytes Relative: 11 %
Neutro Abs: 3.7 10*3/uL (ref 1.7–7.7)
Neutrophils Relative %: 74 %
Platelet Count: 188 10*3/uL (ref 150–400)
RBC: 3.8 MIL/uL — ABNORMAL LOW (ref 4.22–5.81)
RDW: 12.9 % (ref 11.5–15.5)
WBC Count: 5.1 10*3/uL (ref 4.0–10.5)
nRBC: 0 % (ref 0.0–0.2)

## 2021-06-18 LAB — CMP (CANCER CENTER ONLY)
ALT: 22 U/L (ref 0–44)
AST: 21 U/L (ref 15–41)
Albumin: 4.2 g/dL (ref 3.5–5.0)
Alkaline Phosphatase: 54 U/L (ref 38–126)
Anion gap: 6 (ref 5–15)
BUN: 13 mg/dL (ref 8–23)
CO2: 29 mmol/L (ref 22–32)
Calcium: 8.6 mg/dL — ABNORMAL LOW (ref 8.9–10.3)
Chloride: 101 mmol/L (ref 98–111)
Creatinine: 0.59 mg/dL — ABNORMAL LOW (ref 0.61–1.24)
GFR, Estimated: >60 mL/min (ref >60)
Glucose, Bld: 104 mg/dL — ABNORMAL HIGH (ref 70–99)
Potassium: 4.3 mmol/L (ref 3.5–5.1)
Sodium: 136 mmol/L (ref 135–145)
Total Bilirubin: 0.5 mg/dL (ref 0.3–1.2)
Total Protein: 7 g/dL (ref 6.5–8.1)

## 2021-06-18 LAB — LACTATE DEHYDROGENASE: LDH: 208 U/L — ABNORMAL HIGH (ref 98–192)

## 2021-06-18 MED ORDER — ESBRIET 801 MG PO TABS: 1.0000 | ORAL_TABLET | Freq: Three times a day (TID) | ORAL | 5 refills | Status: DC

## 2021-06-18 NOTE — Research (Signed)
IPF-PRO Registry  Purpose: To collect data and biological samples that will support future research studies.  The goal of the registry is to research the current approaches to diagnosis, treatment, and progression of IPF. In addition, the registry will analyze participant characteristics, assess quality of life, describe participants interactions with the health care system, determine IPF treatment practices across multiple institutions, and utilize biological samples to identify disease biomarkers.   Clinical Research Coordinator / Research RN note : This visit for [name] with DOB: 04/11/1941 on 06/18/2021 for the above protocol is Visit/Encounter #8  and is for purpose of research.   The consent for this encounter is under Protocol Version Amendment 4 (Version Date: 17 March 2018 and is currently IRB approved.   Subject expressed continued interest and consent in continuing as a study subject. Subject confirmed that there was no change in contact information (e.g. address, telephone, email). Subject thanked for participation in research and contribution to science.    During this visit on 06/17/2021, the subject completed the blood work and questionnaires per the above referenced protocol. Please refer to the subject's paper source binder for further details.   Signed by  Chili Assistant [facility]  [place], [place] 9:19 AM 06/18/2021

## 2021-06-18 NOTE — Telephone Encounter (Signed)
LFTs wnl from 06/17/21  Rx for Esbriet (brand name) sent to [facility] - dose 833m three times daily  Called patient to notify.   DKnox Saliva PharmD, MPH, BCPS Clinical Pharmacist (Rheumatology and Pulmonology)

## 2021-06-19 DIAGNOSIS — M8448XA Pathological fracture, other site, initial encounter for fracture: Secondary | ICD-10-CM | POA: Diagnosis not present

## 2021-06-19 DIAGNOSIS — R030 Elevated blood-pressure reading, without diagnosis of hypertension: Secondary | ICD-10-CM | POA: Diagnosis not present

## 2021-06-20 ENCOUNTER — Telehealth: Payer: Self-pay | Admitting: Nurse Practitioner

## 2021-06-20 ENCOUNTER — Other Ambulatory Visit: Payer: Self-pay | Admitting: Nurse Practitioner

## 2021-06-20 DIAGNOSIS — C8330 Diffuse large B-cell lymphoma, unspecified site: Secondary | ICD-10-CM

## 2021-06-20 NOTE — Telephone Encounter (Signed)
I connected by phone with Westley Chandler on 06/20/2021, 4:29 PM to discuss the potential use of a new treatment, tixagevimab/cilgavimab, for pre-exposure prophylaxis for prevention of coronavirus disease 2019 (COVID-19) caused by the SARS-CoV-2 virus.  This patient is a 80 y.o. male that meets the FDA criteria for Emergency Use Authorization of tixagevimab/cilgavimab for pre-exposure prophylaxis of COVID-19 disease. Pt meets following criteria: Age >12 yr and weight > 40kg Not currently infected with SARS-CoV-2 and has no known recent exposure to an individual infected with SARS-CoV-2 AND Who has moderate to severe immune compromise due to a medical condition or receipt of immunosuppressive medications or treatments and may not mount an adequate immune response to COVID-19 vaccination or  Vaccination with any available COVID-19 vaccine, according to the approved or authorized schedule, is not recommended due to a history of severe adverse reaction (e.g., severe allergic reaction) to a COVID-19 vaccine(s) and/or COVID-19 vaccine component(s).  Patient meets the following definition of mod-severe immune compromised status: 6. 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  The significant known and potential risks and benefits of COVID monoclonal antibody, and the extent to which such potential risks and benefits are unknown.  Information on available alternative treatments and the risks and benefits of those alternatives, including clinical trials.  The patient or parent/caregiver has the option to accept or refuse COVID monoclonal  antibody treatment.  After reviewing this information with the patient, agree to receive tixagevimab/cilgavimab  Ned Card, NP, 06/20/2021, 4:29 PM

## 2021-06-24 ENCOUNTER — Inpatient Hospital Stay: Payer: PPO

## 2021-06-24 ENCOUNTER — Other Ambulatory Visit: Payer: Self-pay

## 2021-06-24 DIAGNOSIS — Z2809 Immunization not carried out because of other contraindication: Secondary | ICD-10-CM | POA: Diagnosis not present

## 2021-06-24 DIAGNOSIS — Z79899 Other long term (current) drug therapy: Secondary | ICD-10-CM | POA: Diagnosis not present

## 2021-06-24 DIAGNOSIS — J84112 Idiopathic pulmonary fibrosis: Secondary | ICD-10-CM | POA: Diagnosis not present

## 2021-06-24 DIAGNOSIS — C8590 Non-Hodgkin lymphoma, unspecified, unspecified site: Secondary | ICD-10-CM | POA: Diagnosis not present

## 2021-06-24 DIAGNOSIS — K648 Other hemorrhoids: Secondary | ICD-10-CM | POA: Diagnosis not present

## 2021-06-24 DIAGNOSIS — C8330 Diffuse large B-cell lymphoma, unspecified site: Secondary | ICD-10-CM

## 2021-06-24 DIAGNOSIS — K5790 Diverticulosis of intestine, part unspecified, without perforation or abscess without bleeding: Secondary | ICD-10-CM | POA: Diagnosis not present

## 2021-06-24 DIAGNOSIS — Z298 Encounter for other specified prophylactic measures: Secondary | ICD-10-CM | POA: Diagnosis not present

## 2021-06-24 DIAGNOSIS — R630 Anorexia: Secondary | ICD-10-CM | POA: Diagnosis not present

## 2021-06-24 DIAGNOSIS — R5381 Other malaise: Secondary | ICD-10-CM | POA: Diagnosis not present

## 2021-06-24 DIAGNOSIS — K589 Irritable bowel syndrome without diarrhea: Secondary | ICD-10-CM | POA: Diagnosis not present

## 2021-06-24 DIAGNOSIS — C61 Malignant neoplasm of prostate: Secondary | ICD-10-CM | POA: Diagnosis not present

## 2021-06-24 LAB — MULTIPLE MYELOMA PANEL, SERUM
Albumin SerPl Elph-Mcnc: 4 g/dL (ref 2.9–4.4)
Albumin/Glob SerPl: 1.5 (ref 0.7–1.7)
Alpha 1: 0.2 g/dL (ref 0.0–0.4)
Alpha2 Glob SerPl Elph-Mcnc: 0.7 g/dL (ref 0.4–1.0)
B-Globulin SerPl Elph-Mcnc: 0.9 g/dL (ref 0.7–1.3)
Gamma Glob SerPl Elph-Mcnc: 1 g/dL (ref 0.4–1.8)
Globulin, Total: 2.8 g/dL (ref 2.2–3.9)
IgA: 128 mg/dL (ref 61–437)
IgG (Immunoglobin G), Serum: 1067 mg/dL (ref 603–1613)
IgM (Immunoglobulin M), Srm: 124 mg/dL (ref 15–143)
Total Protein ELP: 6.8 g/dL (ref 6.0–8.5)

## 2021-06-24 MED ORDER — CILGAVIMAB (PART OF EVUSHELD) INJECTION
300.0000 mg | Freq: Once | INTRAMUSCULAR | Status: AC
  Administered 2021-06-24: 09:00:00 300 mg via INTRAMUSCULAR
  Filled 2021-06-24: qty 3

## 2021-06-24 MED ORDER — TIXAGEVIMAB (PART OF EVUSHELD) INJECTION
300.0000 mg | Freq: Once | INTRAMUSCULAR | Status: AC
  Administered 2021-06-24: 09:00:00 300 mg via INTRAMUSCULAR
  Filled 2021-06-24: qty 3

## 2021-06-24 NOTE — Progress Notes (Signed)
Patient observed for 30 minutes post Evusheld Injection with no concerns. Printed and verbal education provided.

## 2021-06-24 NOTE — Patient Instructions (Signed)
Tixagevimab; Cilgavimab Solutions for Injection What is this medication? TIXAGEVIMAB; CILGAVIMAB (tix a jev i mab; sil gav i mab) reduces the risk of getting COVID-19 in persons with immune system problems who may not respond properly to the COVID-19 vaccine or persons who can't receive the COVID-19 vaccine. It belongs to a group of medications called monoclonal antibodies. It may decrease the risk of developing severe symptoms of COVID-19. It may also decrease the chance of going to the hospital. This medication is not approved by the FDA. The FDA has authorized emergency use of this medication during the COVID-19 pandemic. This medicine may be used for other purposes; ask your health care provider or pharmacist if you have questions. COMMON BRAND NAME(S): EVUSHELD What should I tell my care team before I take this medication? They need to know if you have any of these conditions: any allergies any serious illness bleeding disorder have received a COVID-19 vaccine heart disease an unusual or allergic reaction to tixagevimab, cilgavimab, other medications, foods, dyes, or preservatives pregnant or trying to get pregnant breast-feeding How should I use this medication? This medication is injected into a muscle. It is given by your care team in a hospital or clinic setting. Talk to your care team about the use of this medication in children. While it may be given to children as [place] as 12 years, precautions do apply. Overdosage: If you think you have taken too much of this medicine contact a poison control center or emergency room at once. NOTE: This medicine is only for you. Do not share this medicine with others. What if I miss a dose? Keep appointments for follow-up doses. It is important not to miss your dose. Call your care team if you are unable to keep an appointment. What may interact with this medication? COVID-19 vaccines This list may not describe all possible interactions. Give  your health care provider a list of all the medicines, herbs, non-prescription drugs, or dietary supplements you use. Also tell them if you smoke, drink alcohol, or use illegal drugs. Some items may interact with your medicine. What should I watch for while using this medication? Your condition will be monitored carefully while you are receiving this medication. Visit your care team for regular checks on your progress. Tell your care team if your symptoms do not start to get better or if they get worse. After receiving this medication, wait at least 14 days before getting the COVID-19 vaccine. What side effects may I notice from receiving this medication? Side effects that you should report to your care team as soon as possible: Allergic reactions--skin rash, itching, hives, swelling of the face, lips, tongue, or throat Heart attack--pain or tightness in the chest, shoulders, arms, or jaw, nausea, shortness of breath, cold or clammy skin, feeling faint or lightheaded Heart failure--shortness of breath, swelling of the ankles, feet, or hands, sudden weight gain, unusual weakness or fatigue Heart rhythm changes--fast or irregular heartbeat, dizziness, feeling faint or lightheaded, chest pain, trouble breathing Side effects that usually do not require medical attention (report these to your care team if they continue or are bothersome): Cough Fatigue Headache Pain, redness, or irritation at site where injected This list may not describe all possible side effects. Call your doctor for medical advice about side effects. You may report side effects to FDA at 1-800-FDA-1088. Where should I keep my medication? This medication is given in a hospital or clinic. It will not be stored at home. NOTE: This sheet is  a summary. It may not cover all possible information. If you have questions about this medicine, talk to your doctor, pharmacist, or health care provider.  2022 Elsevier/Gold Standard (2020-06-13  00:00:00)

## 2021-06-25 ENCOUNTER — Inpatient Hospital Stay: Payer: PPO

## 2021-06-26 ENCOUNTER — Inpatient Hospital Stay: Payer: PPO

## 2021-06-26 DIAGNOSIS — M4726 Other spondylosis with radiculopathy, lumbar region: Secondary | ICD-10-CM | POA: Diagnosis not present

## 2021-07-06 DIAGNOSIS — C8330 Diffuse large B-cell lymphoma, unspecified site: Secondary | ICD-10-CM

## 2021-07-06 HISTORY — DX: Diffuse large B-cell lymphoma, unspecified site: C83.30

## 2021-07-07 ENCOUNTER — Other Ambulatory Visit: Payer: Self-pay | Admitting: Oncology

## 2021-07-09 NOTE — Progress Notes (Signed)
Pharmacist Chemotherapy Monitoring - Initial Assessment    Anticipated start date: 07/10/21   The following has been reviewed per standard work regarding the patient's treatment regimen: The patient's diagnosis, treatment plan and drug doses, and organ/hematologic function Lab orders and baseline tests specific to treatment regimen  The treatment plan start date, drug sequencing, and pre-medications Prior authorization status  Patient's documented medication list, including drug-drug interaction screen and prescriptions for anti-emetics and supportive care specific to the treatment regimen The drug concentrations, fluid compatibility, administration routes, and timing of the medications to be used The patient's access for treatment and lifetime cumulative dose history, if applicable  The patient's medication allergies and previous infusion related reactions, if applicable   Changes made to treatment plan:  N/A  Follow up needed:  N/A   Patrica Duel, [facility], 07/09/2021  3:36 PM

## 2021-07-10 ENCOUNTER — Encounter: Payer: Self-pay | Admitting: Oncology

## 2021-07-10 ENCOUNTER — Inpatient Hospital Stay: Payer: PPO

## 2021-07-10 ENCOUNTER — Other Ambulatory Visit: Payer: PPO

## 2021-07-10 ENCOUNTER — Other Ambulatory Visit: Payer: Self-pay | Admitting: *Deleted

## 2021-07-10 ENCOUNTER — Other Ambulatory Visit: Payer: Self-pay

## 2021-07-10 ENCOUNTER — Telehealth: Payer: Self-pay

## 2021-07-10 ENCOUNTER — Inpatient Hospital Stay: Payer: PPO | Attending: Oncology

## 2021-07-10 ENCOUNTER — Inpatient Hospital Stay: Payer: PPO | Admitting: Oncology

## 2021-07-10 VITALS — BP 153/81 | HR 87 | Temp 98.1°F | Resp 20 | Ht 69.0 in | Wt 170.0 lb

## 2021-07-10 VITALS — BP 131/67 | HR 83 | Temp 97.5°F | Resp 18

## 2021-07-10 DIAGNOSIS — Z5112 Encounter for antineoplastic immunotherapy: Secondary | ICD-10-CM | POA: Diagnosis not present

## 2021-07-10 DIAGNOSIS — C8330 Diffuse large B-cell lymphoma, unspecified site: Secondary | ICD-10-CM

## 2021-07-10 DIAGNOSIS — K648 Other hemorrhoids: Secondary | ICD-10-CM | POA: Insufficient documentation

## 2021-07-10 DIAGNOSIS — J84112 Idiopathic pulmonary fibrosis: Secondary | ICD-10-CM | POA: Insufficient documentation

## 2021-07-10 DIAGNOSIS — C8590 Non-Hodgkin lymphoma, unspecified, unspecified site: Secondary | ICD-10-CM | POA: Insufficient documentation

## 2021-07-10 DIAGNOSIS — C61 Malignant neoplasm of prostate: Secondary | ICD-10-CM | POA: Diagnosis not present

## 2021-07-10 DIAGNOSIS — Z5111 Encounter for antineoplastic chemotherapy: Secondary | ICD-10-CM | POA: Diagnosis not present

## 2021-07-10 DIAGNOSIS — Z79899 Other long term (current) drug therapy: Secondary | ICD-10-CM | POA: Insufficient documentation

## 2021-07-10 DIAGNOSIS — R110 Nausea: Secondary | ICD-10-CM | POA: Diagnosis not present

## 2021-07-10 DIAGNOSIS — K589 Irritable bowel syndrome without diarrhea: Secondary | ICD-10-CM | POA: Insufficient documentation

## 2021-07-10 DIAGNOSIS — R0609 Other forms of dyspnea: Secondary | ICD-10-CM | POA: Insufficient documentation

## 2021-07-10 LAB — URIC ACID: Uric Acid, Serum: 3.7 mg/dL (ref 3.7–8.6)

## 2021-07-10 LAB — CBC WITH DIFFERENTIAL (CANCER CENTER ONLY)
Abs Immature Granulocytes: 0.03 10*3/uL (ref 0.00–0.07)
Basophils Absolute: 0 10*3/uL (ref 0.0–0.1)
Basophils Relative: 0 %
Eosinophils Absolute: 0.3 10*3/uL (ref 0.0–0.5)
Eosinophils Relative: 5 %
HCT: 32.6 % — ABNORMAL LOW (ref 39.0–52.0)
Hemoglobin: 11.3 g/dL — ABNORMAL LOW (ref 13.0–17.0)
Immature Granulocytes: 1 %
Lymphocytes Relative: 9 %
Lymphs Abs: 0.5 10*3/uL — ABNORMAL LOW (ref 0.7–4.0)
MCH: 31.4 pg (ref 26.0–34.0)
MCHC: 34.7 g/dL (ref 30.0–36.0)
MCV: 90.6 fL (ref 80.0–100.0)
Monocytes Absolute: 0.5 10*3/uL (ref 0.1–1.0)
Monocytes Relative: 9 %
Neutro Abs: 4.5 10*3/uL (ref 1.7–7.7)
Neutrophils Relative %: 76 %
Platelet Count: 194 10*3/uL (ref 150–400)
RBC: 3.6 MIL/uL — ABNORMAL LOW (ref 4.22–5.81)
RDW: 12.6 % (ref 11.5–15.5)
WBC Count: 5.8 10*3/uL (ref 4.0–10.5)
nRBC: 0 % (ref 0.0–0.2)

## 2021-07-10 LAB — HEPATITIS B SURFACE ANTIGEN: Hepatitis B Surface Ag: NONREACTIVE

## 2021-07-10 LAB — CMP (CANCER CENTER ONLY)
ALT: 15 U/L (ref 0–44)
AST: 20 U/L (ref 15–41)
Albumin: 4.1 g/dL (ref 3.5–5.0)
Alkaline Phosphatase: 54 U/L (ref 38–126)
Anion gap: 9 (ref 5–15)
BUN: 11 mg/dL (ref 8–23)
CO2: 26 mmol/L (ref 22–32)
Calcium: 8.7 mg/dL — ABNORMAL LOW (ref 8.9–10.3)
Chloride: 101 mmol/L (ref 98–111)
Creatinine: 0.63 mg/dL (ref 0.61–1.24)
GFR, Estimated: >60 mL/min (ref >60)
Glucose, Bld: 129 mg/dL — ABNORMAL HIGH (ref 70–99)
Potassium: 3.9 mmol/L (ref 3.5–5.1)
Sodium: 136 mmol/L (ref 135–145)
Total Bilirubin: 0.4 mg/dL (ref 0.3–1.2)
Total Protein: 6.9 g/dL (ref 6.5–8.1)

## 2021-07-10 LAB — LACTATE DEHYDROGENASE: LDH: 218 U/L — ABNORMAL HIGH (ref 98–192)

## 2021-07-10 LAB — HEPATITIS B CORE ANTIBODY, TOTAL: Hep B Core Total Ab: NONREACTIVE

## 2021-07-10 MED ORDER — SODIUM CHLORIDE 0.9 % IV SOLN
150.0000 mg | Freq: Once | INTRAVENOUS | Status: AC
  Administered 2021-07-10: 150 mg via INTRAVENOUS
  Filled 2021-07-10: qty 5

## 2021-07-10 MED ORDER — DIPHENHYDRAMINE HCL 25 MG PO CAPS
50.0000 mg | ORAL_CAPSULE | Freq: Once | ORAL | Status: AC
  Administered 2021-07-10: 50 mg via ORAL
  Filled 2021-07-10: qty 2

## 2021-07-10 MED ORDER — SODIUM CHLORIDE 0.9 % IV SOLN
400.0000 mg/m2 | Freq: Once | INTRAVENOUS | Status: AC
  Administered 2021-07-10: 780 mg via INTRAVENOUS
  Filled 2021-07-10: qty 39

## 2021-07-10 MED ORDER — VINCRISTINE SULFATE CHEMO INJECTION 1 MG/ML
1.0000 mg | Freq: Once | INTRAVENOUS | Status: AC
  Administered 2021-07-10: 1 mg via INTRAVENOUS
  Filled 2021-07-10: qty 1

## 2021-07-10 MED ORDER — SODIUM CHLORIDE 0.9 % IV SOLN
375.0000 mg/m2 | Freq: Once | INTRAVENOUS | Status: AC
  Administered 2021-07-10: 700 mg via INTRAVENOUS
  Filled 2021-07-10: qty 50

## 2021-07-10 MED ORDER — DOXORUBICIN HCL CHEMO IV INJECTION 2 MG/ML
25.0000 mg/m2 | Freq: Once | INTRAVENOUS | Status: AC
  Administered 2021-07-10: 48 mg via INTRAVENOUS
  Filled 2021-07-10: qty 24

## 2021-07-10 MED ORDER — ACETAMINOPHEN 325 MG PO TABS
650.0000 mg | ORAL_TABLET | Freq: Once | ORAL | Status: AC
  Administered 2021-07-10: 650 mg via ORAL
  Filled 2021-07-10: qty 2

## 2021-07-10 MED ORDER — PALONOSETRON HCL INJECTION 0.25 MG/5ML
0.2500 mg | Freq: Once | INTRAVENOUS | Status: AC
  Administered 2021-07-10: 0.25 mg via INTRAVENOUS
  Filled 2021-07-10: qty 5

## 2021-07-10 MED ORDER — SODIUM CHLORIDE 0.9 % IV SOLN: Freq: Once | INTRAVENOUS | Status: AC

## 2021-07-10 MED ORDER — SODIUM CHLORIDE 0.9 % IV SOLN
10.0000 mg | Freq: Once | INTRAVENOUS | Status: AC
  Administered 2021-07-10: 10 mg via INTRAVENOUS
  Filled 2021-07-10: qty 1

## 2021-07-10 NOTE — Patient Instructions (Signed)
[name]  Discharge Instructions: Thank you for choosing [facility] to provide your oncology and hematology care.   If you have a lab appointment with the [facility], please go directly to the [facility] and check in at the registration area.   Wear comfortable clothing and clothing appropriate for easy access to any Portacath or PICC line.   We strive to give you quality time with your provider. You may need to reschedule your appointment if you arrive late (15 or more minutes).  Arriving late affects you and other patients whose appointments are after yours.  Also, if you miss three or more appointments without notifying the office, you may be dismissed from the clinic at the providers discretion.      For prescription refill requests, have your pharmacy contact our office and allow 72 hours for refills to be completed.    Today you received the following chemotherapy and/or immunotherapy agents: doxorubicin, vincristine, cyclophosphamide, rituximab.       To help prevent nausea and vomiting after your treatment, we encourage you to take your nausea medication as directed.  BELOW ARE SYMPTOMS THAT SHOULD BE REPORTED IMMEDIATELY: *FEVER GREATER THAN 100.4 F (38 C) OR HIGHER *CHILLS OR SWEATING *NAUSEA AND VOMITING THAT IS NOT CONTROLLED WITH YOUR NAUSEA MEDICATION *UNUSUAL SHORTNESS OF BREATH *UNUSUAL BRUISING OR BLEEDING *URINARY PROBLEMS (pain or burning when urinating, or frequent urination) *BOWEL PROBLEMS (unusual diarrhea, constipation, pain near the anus) TENDERNESS IN MOUTH AND THROAT WITH OR WITHOUT PRESENCE OF ULCERS (sore throat, sores in mouth, or a toothache) UNUSUAL RASH, SWELLING OR PAIN  UNUSUAL VAGINAL DISCHARGE OR ITCHING   Items with * indicate a potential emergency and should be followed up as soon as possible or go to the Emergency Department if any problems should occur.  Please show the CHEMOTHERAPY ALERT CARD  or IMMUNOTHERAPY ALERT CARD at check-in to the Emergency Department and triage nurse.  Should you have questions after your visit or need to cancel or reschedule your appointment, please contact [facility]  Dept: [phone]  and follow the prompts.  Office hours are 8:00 a.m. to 4:30 p.m. Monday - Friday. Please note that voicemails left after 4:00 p.m. may not be returned until the following business day.  We are closed weekends and major holidays. You have access to a nurse at all times for urgent questions. Please call the main number to the clinic Dept: [phone] and follow the prompts.   For any non-urgent questions, you may also contact your provider using MyChart. We now offer e-Visits for anyone 27 and older to request care online for non-urgent symptoms. For details visit [email or web address].   Also download the MyChart app! Go to the app store, search "MyChart", open the app, select [facility], and log in with your MyChart username and password.  Due to Covid, a mask is required upon entering the hospital/clinic. If you do not have a mask, one will be given to you upon arrival. For doctor visits, patients may have 1 support person aged 64 or older with them. For treatment visits, patients cannot have anyone with them due to current Covid guidelines and our immunocompromised population.    Doxorubicin injection What is this medication? DOXORUBICIN (dox oh ROO bi sin) is a chemotherapy drug. It is used to treat many kinds of cancer like leukemia, lymphoma, neuroblastoma, sarcoma, and Wilms' tumor. It is also used to treat bladder cancer, breast cancer, lung  cancer, ovarian cancer, stomach cancer, and thyroid cancer. This medicine may be used for other purposes; ask your health care provider or pharmacist if you have questions. COMMON BRAND NAME(S): Adriamycin, Adriamycin PFS, Adriamycin RDF, Rubex What should I tell my care team before I take this  medication? They need to know if you have any of these conditions: heart disease history of low blood counts caused by a medicine liver disease recent or ongoing radiation therapy an unusual or allergic reaction to doxorubicin, other chemotherapy agents, other medicines, foods, dyes, or preservatives pregnant or trying to get pregnant breast-feeding How should I use this medication? This drug is given as an infusion into a vein. It is administered in a hospital or clinic by a specially trained health care professional. If you have pain, swelling, burning or any unusual feeling around the site of your injection, tell your health care professional right away. Talk to your pediatrician regarding the use of this medicine in children. Special care may be needed. Overdosage: If you think you have taken too much of this medicine contact a poison control center or emergency room at once. NOTE: This medicine is only for you. Do not share this medicine with others. What if I miss a dose? It is important not to miss your dose. Call your doctor or health care professional if you are unable to keep an appointment. What may interact with this medication? This medicine may interact with the following medications: 6-mercaptopurine paclitaxel phenytoin St. John's Wort trastuzumab verapamil This list may not describe all possible interactions. Give your health care provider a list of all the medicines, herbs, non-prescription drugs, or dietary supplements you use. Also tell them if you smoke, drink alcohol, or use illegal drugs. Some items may interact with your medicine. What should I watch for while using this medication? This drug may make you feel generally unwell. This is not uncommon, as chemotherapy can affect healthy cells as well as cancer cells. Report any side effects. Continue your course of treatment even though you feel ill unless your doctor tells you to stop. There is a maximum amount of  this medicine you should receive throughout your life. The amount depends on the medical condition being treated and your overall health. Your doctor will watch how much of this medicine you receive in your lifetime. Tell your doctor if you have taken this medicine before. You may need blood work done while you are taking this medicine. Your urine may turn red for a few days after your dose. This is not blood. If your urine is dark or brown, call your doctor. In some cases, you may be given additional medicines to help with side effects. Follow all directions for their use. Call your doctor or health care professional for advice if you get a fever, chills or sore throat, or other symptoms of a cold or flu. Do not treat yourself. This drug decreases your body's ability to fight infections. Try to avoid being around people who are sick. This medicine may increase your risk to bruise or bleed. Call your doctor or health care professional if you notice any unusual bleeding. Talk to your doctor about your risk of cancer. You may be more at risk for certain types of cancers if you take this medicine. Do not become pregnant while taking this medicine or for 6 months after stopping it. Women should inform their doctor if they wish to become pregnant or think they might be pregnant. Men should not father  a child while taking this medicine and for 6 months after stopping it. There is a potential for serious side effects to an unborn child. Talk to your health care professional or pharmacist for more information. Do not breast-feed an infant while taking this medicine. This medicine has caused ovarian failure in some women and reduced sperm counts in some men This medicine may interfere with the ability to have a child. Talk with your doctor or health care professional if you are concerned about your fertility. This medicine may cause a decrease in Co-Enzyme Q-10. You should make sure that you get enough Co-Enzyme  Q-10 while you are taking this medicine. Discuss the foods you eat and the vitamins you take with your health care professional. What side effects may I notice from receiving this medication? Side effects that you should report to your doctor or health care professional as soon as possible: allergic reactions like skin rash, itching or hives, swelling of the face, lips, or tongue breathing problems chest pain fast or irregular heartbeat low blood counts - this medicine may decrease the number of white blood cells, red blood cells and platelets. You may be at increased risk for infections and bleeding. pain, redness, or irritation at site where injected signs of infection - fever or chills, cough, sore throat, pain or difficulty passing urine signs of decreased platelets or bleeding - bruising, pinpoint red spots on the skin, black, tarry stools, blood in the urine swelling of the ankles, feet, hands tiredness weakness Side effects that usually do not require medical attention (report to your doctor or health care professional if they continue or are bothersome): diarrhea hair loss mouth sores nail discoloration or damage nausea red colored urine vomiting This list may not describe all possible side effects. Call your doctor for medical advice about side effects. You may report side effects to FDA at 1-800-FDA-1088. Where should I keep my medication? This drug is given in a hospital or clinic and will not be stored at home. NOTE: This sheet is a summary. It may not cover all possible information. If you have questions about this medicine, talk to your doctor, pharmacist, or health care provider.  2022 Elsevier/Gold Standard (2017-02-25 00:00:00)  Vincristine injection What is this medication? VINCRISTINE (vin KRIS teen) is a chemotherapy drug. It slows the growth of cancer cells. This medicine is used to treat many types of cancer like Hodgkin's disease, leukemia, non-Hodgkin's  lymphoma, neuroblastoma (brain cancer), rhabdomyosarcoma, and Wilms' tumor. This medicine may be used for other purposes; ask your health care provider or pharmacist if you have questions. COMMON BRAND NAME(S): Oncovin, Vincasar PFS What should I tell my care team before I take this medication? They need to know if you have any of these conditions: blood disorders gout infection (especially chickenpox, cold sores, or herpes) kidney disease liver disease lung disease nervous system disease like Charcot-Marie-Tooth (CMT) recent or ongoing radiation therapy an unusual or allergic reaction to vincristine, other chemotherapy agents, other medicines, foods, dyes, or preservatives pregnant or trying to get pregnant breast-feeding How should I use this medication? This drug is given as an infusion into a vein. It is administered in a hospital or clinic by a specially trained health care professional. If you have pain, swelling, burning, or any unusual feeling around the site of your injection, tell your health care professional right away. Talk to your pediatrician regarding the use of this medicine in children. While this drug may be prescribed for selected conditions, precautions  do apply. Overdosage: If you think you have taken too much of this medicine contact a poison control center or emergency room at once. NOTE: This medicine is only for you. Do not share this medicine with others. What if I miss a dose? It is important not to miss your dose. Call your doctor or health care professional if you are unable to keep an appointment. What may interact with this medication? certain medicines for fungal infections like itraconazole, ketoconazole, posaconazole, voriconazole certain medicines for seizures like phenytoin This list may not describe all possible interactions. Give your health care provider a list of all the medicines, herbs, non-prescription drugs, or dietary supplements you use. Also  tell them if you smoke, drink alcohol, or use illegal drugs. Some items may interact with your medicine. What should I watch for while using this medication? This drug may make you feel generally unwell. This is not uncommon, as chemotherapy can affect healthy cells as well as cancer cells. Report any side effects. Continue your course of treatment even though you feel ill unless your doctor tells you to stop. You may need blood work done while you are taking this medicine. This medicine will cause constipation. Try to have a bowel movement at least every 2 to 3 days. If you do not have a bowel movement for 3 days, call your doctor or health care professional. In some cases, you may be given additional medicines to help with side effects. Follow all directions for their use. Do not become pregnant while taking this medicine. Women should inform their doctor if they wish to become pregnant or think they might be pregnant. There is a potential for serious side effects to an unborn child. Talk to your health care professional or pharmacist for more information. Do not breast-feed an infant while taking this medicine. This medicine may make it more difficult to get pregnant or to father a child. Talk to your healthcare professional if you are concerned about your fertility. What side effects may I notice from receiving this medication? Side effects that you should report to your doctor or health care professional as soon as possible: allergic reactions like skin rash, itching or hives, swelling of the face, lips, or tongue breathing problems confusion or changes in emotions or moods constipation cough mouth sores muscle weakness nausea and vomiting pain, swelling, redness or irritation at the injection site pain, tingling, numbness in the hands or feet problems with balance, talking, walking seizures stomach pain trouble passing urine or change in the amount of urine Side effects that usually do  not require medical attention (report to your doctor or health care professional if they continue or are bothersome): diarrhea hair loss jaw pain loss of appetite This list may not describe all possible side effects. Call your doctor for medical advice about side effects. You may report side effects to FDA at 1-800-FDA-1088. Where should I keep my medication? This drug is given in a hospital or clinic and will not be stored at home. NOTE: This sheet is a summary. It may not cover all possible information. If you have questions about this medicine, talk to your doctor, pharmacist, or health care provider.  2022 Elsevier/Gold Standard (2021-03-11 00:00:00)  Cyclophosphamide Injection What is this medication? CYCLOPHOSPHAMIDE (sye kloe FOSS fa mide) is a chemotherapy drug. It slows the growth of cancer cells. This medicine is used to treat many types of cancer like lymphoma, myeloma, leukemia, breast cancer, and ovarian cancer, to name a few. This  medicine may be used for other purposes; ask your health care provider or pharmacist if you have questions. COMMON BRAND NAME(S): Cytoxan, Neosar What should I tell my care team before I take this medication? They need to know if you have any of these conditions: heart disease history of irregular heartbeat infection kidney disease liver disease low blood counts, like white cells, platelets, or red blood cells on hemodialysis recent or ongoing radiation therapy scarring or thickening of the lungs trouble passing urine an unusual or allergic reaction to cyclophosphamide, other medicines, foods, dyes, or preservatives pregnant or trying to get pregnant breast-feeding How should I use this medication? This drug is usually given as an injection into a vein or muscle or by infusion into a vein. It is administered in a hospital or clinic by a specially trained health care professional. Talk to your pediatrician regarding the use of this medicine in  children. Special care may be needed. Overdosage: If you think you have taken too much of this medicine contact a poison control center or emergency room at once. NOTE: This medicine is only for you. Do not share this medicine with others. What if I miss a dose? It is important not to miss your dose. Call your doctor or health care professional if you are unable to keep an appointment. What may interact with this medication? amphotericin B azathioprine certain antivirals for HIV or hepatitis certain medicines for blood pressure, heart disease, irregular heart beat certain medicines that treat or prevent blood clots like warfarin certain other medicines for cancer cyclosporine etanercept indomethacin medicines that relax muscles for surgery medicines to increase blood counts metronidazole This list may not describe all possible interactions. Give your health care provider a list of all the medicines, herbs, non-prescription drugs, or dietary supplements you use. Also tell them if you smoke, drink alcohol, or use illegal drugs. Some items may interact with your medicine. What should I watch for while using this medication? Your condition will be monitored carefully while you are receiving this medicine. You may need blood work done while you are taking this medicine. Drink water or other fluids as directed. Urinate often, even at night. Some products may contain alcohol. Ask your health care professional if this medicine contains alcohol. Be sure to tell all health care professionals you are taking this medicine. Certain medicines, like metronidazole and disulfiram, can cause an unpleasant reaction when taken with alcohol. The reaction includes flushing, headache, nausea, vomiting, sweating, and increased thirst. The reaction can last from 30 minutes to several hours. Do not become pregnant while taking this medicine or for 1 year after stopping it. Women should inform their health care  professional if they wish to become pregnant or think they might be pregnant. Men should not father a child while taking this medicine and for 4 months after stopping it. There is potential for serious side effects to an unborn child. Talk to your health care professional for more information. Do not breast-feed an infant while taking this medicine or for 1 week after stopping it. This medicine has caused ovarian failure in some women. This medicine may make it more difficult to get pregnant. Talk to your health care professional if you are concerned about your fertility. This medicine has caused decreased sperm counts in some men. This may make it more difficult to father a child. Talk to your health care professional if you are concerned about your fertility. Call your health care professional for advice if you get  a fever, chills, or sore throat, or other symptoms of a cold or flu. Do not treat yourself. This medicine decreases your body's ability to fight infections. Try to avoid being around people who are sick. Avoid taking medicines that contain aspirin, acetaminophen, ibuprofen, naproxen, or ketoprofen unless instructed by your health care professional. These medicines may hide a fever. Talk to your health care professional about your risk of cancer. You may be more at risk for certain types of cancer if you take this medicine. If you are going to need surgery or other procedure, tell your health care professional that you are using this medicine. Be careful brushing or flossing your teeth or using a toothpick because you may get an infection or bleed more easily. If you have any dental work done, tell your dentist you are receiving this medicine. What side effects may I notice from receiving this medication? Side effects that you should report to your doctor or health care professional as soon as possible: allergic reactions like skin rash, itching or hives, swelling of the face, lips, or  tongue breathing problems nausea, vomiting signs and symptoms of bleeding such as bloody or black, tarry stools; red or dark brown urine; spitting up blood or brown material that looks like coffee grounds; red spots on the skin; unusual bruising or bleeding from the eyes, gums, or nose signs and symptoms of heart failure like fast, irregular heartbeat, sudden weight gain; swelling of the ankles, feet, hands signs and symptoms of infection like fever; chills; cough; sore throat; pain or trouble passing urine signs and symptoms of kidney injury like trouble passing urine or change in the amount of urine signs and symptoms of liver injury like dark yellow or brown urine; general ill feeling or flu-like symptoms; light-colored stools; loss of appetite; nausea; right upper belly pain; unusually weak or tired; yellowing of the eyes or skin Side effects that usually do not require medical attention (report to your doctor or health care professional if they continue or are bothersome): confusion decreased hearing diarrhea facial flushing hair loss headache loss of appetite missed menstrual periods signs and symptoms of low red blood cells or anemia such as unusually weak or tired; feeling faint or lightheaded; falls skin discoloration This list may not describe all possible side effects. Call your doctor for medical advice about side effects. You may report side effects to FDA at 1-800-FDA-1088. Where should I keep my medication? This drug is given in a hospital or clinic and will not be stored at home. NOTE: This sheet is a summary. It may not cover all possible information. If you have questions about this medicine, talk to your doctor, pharmacist, or health care provider.  2022 Elsevier/Gold Standard (2021-03-11 00:00:00)  Rituximab Injection What is this medication? RITUXIMAB (ri TUX i mab) is a monoclonal antibody. It is used to treat certain types of cancer like non-Hodgkin lymphoma and  chronic lymphocytic leukemia. It is also used to treat rheumatoid arthritis, granulomatosis with polyangiitis, microscopic polyangiitis, and pemphigus vulgaris. This medicine may be used for other purposes; ask your health care provider or pharmacist if you have questions. COMMON BRAND NAME(S): RIABNI, Rituxan, RUXIENCE What should I tell my care team before I take this medication? They need to know if you have any of these conditions: chest pain heart disease infection especially a viral infection such as chickenpox, cold sores, hepatitis B, or herpes immune system problems irregular heartbeat or rhythm kidney disease low blood counts (white cells, platelets, or  red cells) lung disease recent or upcoming vaccine an unusual or allergic reaction to rituximab, other medicines, foods, dyes, or preservatives pregnant or trying to get pregnant breast-feeding How should I use this medication? This medicine is injected into a vein. It is given by a health care provider in a hospital or clinic setting. A special MedGuide will be given to you before each treatment. Be sure to read this information carefully each time. Talk to your health care provider about the use of this medicine in children. While this drug may be prescribed for children as Ertel as 6 months for selected conditions, precautions do apply. Overdosage: If you think you have taken too much of this medicine contact a poison control center or emergency room at once. NOTE: This medicine is only for you. Do not share this medicine with others. What if I miss a dose? Keep appointments for follow-up doses. It is important not to miss your dose. Call your health care provider if you are unable to keep an appointment. What may interact with this medication? Do not take this medicine with any of the following medicines: live vaccines This medicine may also interact with the following medicines: cisplatin This list may not describe all  possible interactions. Give your health care provider a list of all the medicines, herbs, non-prescription drugs, or dietary supplements you use. Also tell them if you smoke, drink alcohol, or use illegal drugs. Some items may interact with your medicine. What should I watch for while using this medication? Your condition will be monitored carefully while you are receiving this medicine. You may need blood work done while you are taking this medicine. This medicine can cause serious infusion reactions. To reduce the risk your health care provider may give you other medicines to take before receiving this one. Be sure to follow the directions from your health care provider. This medicine may increase your risk of getting an infection. Call your health care provider for advice if you get a fever, chills, sore throat, or other symptoms of a cold or flu. Do not treat yourself. Try to avoid being around people who are sick. Call your health care provider if you are around anyone with measles, chickenpox, or if you develop sores or blisters that do not heal properly. Avoid taking medicines that contain aspirin, acetaminophen, ibuprofen, naproxen, or ketoprofen unless instructed by your health care provider. These medicines may hide a fever. This medicine may cause serious skin reactions. They can happen weeks to months after starting the medicine. Contact your health care provider right away if you notice fevers or flu-like symptoms with a rash. The rash may be red or purple and then turn into blisters or peeling of the skin. Or, you might notice a red rash with swelling of the face, lips or lymph nodes in your neck or under your arms. In some patients, this medicine may cause a serious brain infection that may cause death. If you have any problems seeing, thinking, speaking, walking, or standing, tell your healthcare professional right away. If you cannot reach your healthcare professional, urgently seek other  source of medical care. Do not become pregnant while taking this medicine or for at least 12 months after stopping it. Women should inform their health care provider if they wish to become pregnant or think they might be pregnant. There is potential for serious harm to an unborn child. Talk to your health care provider for more information. Women should use a reliable form of  birth control while taking this medicine and for 12 months after stopping it. Do not breast-feed while taking this medicine or for at least 6 months after stopping it. What side effects may I notice from receiving this medication? Side effects that you should report to your health care provider as soon as possible: allergic reactions (skin rash, itching or hives; swelling of the face, lips, or tongue) diarrhea edema (sudden weight gain; swelling of the ankles, feet, hands or other unusual swelling; trouble breathing) fast, irregular heartbeat heart attack (trouble breathing; pain or tightness in the chest, neck, back or arms; unusually weak or tired) infection (fever, chills, cough, sore throat, pain or trouble passing urine) kidney injury (trouble passing urine or change in the amount of urine) liver injury (dark yellow or brown urine; general ill feeling or flu-like symptoms; loss of appetite, right upper belly pain; unusually weak or tired, yellowing of the eyes or skin) low blood pressure (dizziness; feeling faint or lightheaded, falls; unusually weak or tired) low red blood cell counts (trouble breathing; feeling faint; lightheaded, falls; unusually weak or tired) mouth sores redness, blistering, peeling, or loosening of the skin, including inside the mouth stomach pain unusual bruising or bleeding wheezing (trouble breathing with loud or whistling sounds) vomiting Side effects that usually do not require medical attention (report to your health care provider if they continue or are bothersome): headache joint  pain muscle cramps, pain nausea This list may not describe all possible side effects. Call your doctor for medical advice about side effects. You may report side effects to FDA at 1-800-FDA-1088. Where should I keep my medication? This medicine is given in a hospital or clinic. It will not be stored at home. NOTE: This sheet is a summary. It may not cover all possible information. If you have questions about this medicine, talk to your doctor, pharmacist, or health care provider.  2022 [employer]vier/Gold Standard (2020-06-24 00:00:00)

## 2021-07-10 NOTE — Telephone Encounter (Signed)
Patient seen by Dr. Sherrill today ? ?Vitals are within treatment parameters. ? ?Labs reviewed by Dr. Sherrill and are within treatment parameters. ? ?Per physician team, patient is ready for treatment and there are NO modifications to the treatment plan.  ?

## 2021-07-10 NOTE — Progress Notes (Signed)
Patient presents for treatment. RN assessment completed along with the following:  Labs/vitals reviewed - Yes, and within treatment parameters.   Weight within 10% of previous measurement - Yes Oncology Treatment Attestation completed for current therapy- Yes, on date 06/17/21 Informed consent completed and reflects current therapy/intent - Yes, on date 07/10/21             Provider progress note reviewed - Today's provider note is not yet available. I reviewed the most recent oncology provider progress note in chart dated 06/13/21. Treatment/Antibody/Supportive plan reviewed - Yes, and there are no adjustments needed for today's treatment. S&H and other orders reviewed - Yes, and there are no additional orders identified. Previous treatment date reviewed - Yes, and the appropriate amount of time has elapsed between treatments.  Patient to proceed with treatment.    Blood return checked and noted prior to, during and after administration of both doxorubicin and vincristine.

## 2021-07-10 NOTE — Progress Notes (Signed)
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  He continues to have malaise.  He has noted increased lymphadenopathy in the neck.  No fever or night sweats.  Intermittent nausea relieved with Zofran.  Objective:  Vital signs in last 24 hours:  Blood pressure (!) 153/81, pulse 87, temperature 98.1 F (36.7 C), temperature source Oral, resp. rate 20, height _0  (1.753 m), weight 170 lb (77.1 kg), SpO2 98 %.    Lymphatics: 0.5-1 cm bilateral cervical/scalene nodes.  No axillary or inguinal nodes Resp: End inspiratory rales at the lower posterior chest bilaterally, no respiratory distress Cardio: Regular rate and rhythm GI: No hepatosplenomegaly, nontender Vascular: No leg edema   Lab Results:  Lab Results  Component Value Date   WBC 5.1 06/18/2021   HGB 11.9 (L) 06/18/2021   HCT 35.5 (L) 06/18/2021   MCV 93.4 06/18/2021   PLT 188 06/18/2021   NEUTROABS 3.7 06/18/2021    CMP  Lab Results  Component Value Date   NA 136 06/18/2021   K 4.3 06/18/2021   CL 101 06/18/2021   CO2 29 06/18/2021   GLUCOSE 104 (H) 06/18/2021   BUN 13 06/18/2021   CREATININE 0.59 (L) 06/18/2021   CALCIUM 8.6 (L) 06/18/2021   PROT 7.0 06/18/2021   ALBUMIN 4.2 06/18/2021   AST 21 06/18/2021   ALT 22 06/18/2021   ALKPHOS 54 06/18/2021   BILITOT 0.5 06/18/2021   GFRNONAA >60 06/18/2021   GFRAA >60 06/01/2017     Medications: I have reviewed the patient's current medications.   Assessment/Plan:  Non-Hodgkin's lymphoma CTs neck and chest 03/19/2021-cluster of left supraclavicular nodes measuring less than 1 cm, 12 mm right node next to the jugular vein, chest with slight interval progression of pulmonary fibrosis, new small sclerotic lesion in the right third rib concerning for metastasis, lucent lesion in T8 corresponding to abnormal uptake on a bone scan 05/21/2020 suspicious for malignancy,  subpectoral nodes-subcentimeter in size but larger than 03/28/2019 FNA of left neck node 03/25/2021-atypical lymphoid proliferation Excisional biopsy of level 5 left neck node 04/07/2021-lymph node with marked autolysis and diffusely infiltrated by atypical lymphocytes, 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  I discussed the final pathology report with him by telephone when this became available last month.  I  recommend treatment with R-mini CHOP.  We reviewed potential toxicities associated with this regimen and he attended a chemotherapy teaching class.  The plan is to begin treatment today.  He will call for a fever.  The CBC and chemistry panel are adequate to begin chemotherapy today.  He will return for a nadir CBC and chemistry panel on 07/18/2021.  He will be scheduled for an office visit and cycle 2 R-mini CHOP 07/31/2021  [name], MD  07/10/2021  8:14 AM

## 2021-07-11 ENCOUNTER — Ambulatory Visit: Payer: PPO

## 2021-07-11 ENCOUNTER — Encounter: Payer: Self-pay | Admitting: Oncology

## 2021-07-11 ENCOUNTER — Telehealth: Payer: Self-pay

## 2021-07-11 NOTE — Telephone Encounter (Signed)
Called patient for first time chemo follow-up.  Patient states he was feeling well today with the only side effect being he felt a little more tired than usual.  Patient denied having nausea and has been drinking water and gatorade.  Patient also states his appetite has been good and was able to eat his regular meals.  Reminded patient of upcoming appointment on 07/18/21 and instructed him to call office with any questions or concerns.  Patient verbalized understanding.

## 2021-07-15 DIAGNOSIS — C61 Malignant neoplasm of prostate: Secondary | ICD-10-CM | POA: Diagnosis not present

## 2021-07-17 LAB — MULTIPLE MYELOMA PANEL, SERUM
Albumin SerPl Elph-Mcnc: 4 g/dL (ref 2.9–4.4)
Albumin/Glob SerPl: 1.5 (ref 0.7–1.7)
Alpha 1: 0.2 g/dL (ref 0.0–0.4)
Alpha2 Glob SerPl Elph-Mcnc: 0.7 g/dL (ref 0.4–1.0)
B-Globulin SerPl Elph-Mcnc: 0.8 g/dL (ref 0.7–1.3)
Gamma Glob SerPl Elph-Mcnc: 1 g/dL (ref 0.4–1.8)
Globulin, Total: 2.7 g/dL (ref 2.2–3.9)
IgA: 117 mg/dL (ref 61–437)
IgG (Immunoglobin G), Serum: 1007 mg/dL (ref 603–1613)
IgM (Immunoglobulin M), Srm: 117 mg/dL (ref 15–143)
Total Protein ELP: 6.7 g/dL (ref 6.0–8.5)

## 2021-07-18 ENCOUNTER — Inpatient Hospital Stay: Payer: PPO

## 2021-07-18 ENCOUNTER — Other Ambulatory Visit: Payer: Self-pay

## 2021-07-18 DIAGNOSIS — Z5112 Encounter for antineoplastic immunotherapy: Secondary | ICD-10-CM | POA: Diagnosis not present

## 2021-07-18 DIAGNOSIS — C8330 Diffuse large B-cell lymphoma, unspecified site: Secondary | ICD-10-CM

## 2021-07-18 LAB — CBC WITH DIFFERENTIAL (CANCER CENTER ONLY)
Abs Immature Granulocytes: 0.04 10*3/uL (ref 0.00–0.07)
Basophils Absolute: 0 10*3/uL (ref 0.0–0.1)
Basophils Relative: 1 %
Eosinophils Absolute: 0.3 10*3/uL (ref 0.0–0.5)
Eosinophils Relative: 9 %
HCT: 32 % — ABNORMAL LOW (ref 39.0–52.0)
Hemoglobin: 11.1 g/dL — ABNORMAL LOW (ref 13.0–17.0)
Immature Granulocytes: 1 %
Lymphocytes Relative: 13 %
Lymphs Abs: 0.5 10*3/uL — ABNORMAL LOW (ref 0.7–4.0)
MCH: 31.4 pg (ref 26.0–34.0)
MCHC: 34.7 g/dL (ref 30.0–36.0)
MCV: 90.4 fL (ref 80.0–100.0)
Monocytes Absolute: 0.2 10*3/uL (ref 0.1–1.0)
Monocytes Relative: 4 %
Neutro Abs: 2.8 10*3/uL (ref 1.7–7.7)
Neutrophils Relative %: 72 %
Platelet Count: 209 10*3/uL (ref 150–400)
RBC: 3.54 MIL/uL — ABNORMAL LOW (ref 4.22–5.81)
RDW: 12.2 % (ref 11.5–15.5)
WBC Count: 3.9 10*3/uL — ABNORMAL LOW (ref 4.0–10.5)
nRBC: 0 % (ref 0.0–0.2)

## 2021-07-18 LAB — BASIC METABOLIC PANEL - CANCER CENTER ONLY
Anion gap: 4 — ABNORMAL LOW (ref 5–15)
BUN: 15 mg/dL (ref 8–23)
CO2: 31 mmol/L (ref 22–32)
Calcium: 8.3 mg/dL — ABNORMAL LOW (ref 8.9–10.3)
Chloride: 99 mmol/L (ref 98–111)
Creatinine: 0.64 mg/dL (ref 0.61–1.24)
GFR, Estimated: >60 mL/min (ref >60)
Glucose, Bld: 145 mg/dL — ABNORMAL HIGH (ref 70–99)
Potassium: 4.5 mmol/L (ref 3.5–5.1)
Sodium: 134 mmol/L — ABNORMAL LOW (ref 135–145)

## 2021-07-18 LAB — URIC ACID: Uric Acid, Serum: 3.3 mg/dL — ABNORMAL LOW (ref 3.7–8.6)

## 2021-07-22 ENCOUNTER — Telehealth: Payer: Self-pay | Admitting: Internal Medicine

## 2021-07-22 DIAGNOSIS — J84112 Idiopathic pulmonary fibrosis: Secondary | ICD-10-CM

## 2021-07-22 DIAGNOSIS — C61 Malignant neoplasm of prostate: Secondary | ICD-10-CM | POA: Diagnosis not present

## 2021-07-22 DIAGNOSIS — Z5111 Encounter for antineoplastic chemotherapy: Secondary | ICD-10-CM | POA: Diagnosis not present

## 2021-07-22 MED ORDER — PIRFENIDONE 801 MG PO TABS: 1.0000 | ORAL_TABLET | Freq: Three times a day (TID) | ORAL | 1 refills | Status: DC

## 2021-07-22 NOTE — Telephone Encounter (Signed)
Rx for brand name Esbriet was sent on 06/18/21 with 5 refills. Patient requesting change back to generic - states he is able to tolerate better now. Rx for generic pirfenidone sent to [facility]. LFTs on 07/10/21 wnl  Submitted a Prior Authorization request to  [employer]  for PIRFENIDONE via CoverMyMeds. Will update once we receive a response.  Key: [id number]  Per automated response, prior auth request is duplicate and already approved. No approval dates provided.  Knox Saliva, PharmD, MPH, BCPS Clinical Pharmacist (Rheumatology and Pulmonology)

## 2021-07-27 ENCOUNTER — Other Ambulatory Visit: Payer: Self-pay | Admitting: Oncology

## 2021-07-31 ENCOUNTER — Inpatient Hospital Stay: Payer: PPO

## 2021-07-31 ENCOUNTER — Other Ambulatory Visit: Payer: Self-pay

## 2021-07-31 ENCOUNTER — Inpatient Hospital Stay: Payer: PPO | Admitting: Oncology

## 2021-07-31 ENCOUNTER — Encounter: Payer: Self-pay | Admitting: *Deleted

## 2021-07-31 VITALS — BP 153/90 | HR 88 | Temp 97.7°F | Resp 18

## 2021-07-31 VITALS — BP 140/83 | HR 93 | Temp 97.9°F | Resp 18 | Ht 69.0 in | Wt 169.0 lb

## 2021-07-31 DIAGNOSIS — C8330 Diffuse large B-cell lymphoma, unspecified site: Secondary | ICD-10-CM | POA: Diagnosis not present

## 2021-07-31 DIAGNOSIS — Z5112 Encounter for antineoplastic immunotherapy: Secondary | ICD-10-CM | POA: Diagnosis not present

## 2021-07-31 LAB — CBC WITH DIFFERENTIAL (CANCER CENTER ONLY)
Abs Immature Granulocytes: 0.03 10*3/uL (ref 0.00–0.07)
Basophils Absolute: 0 10*3/uL (ref 0.0–0.1)
Basophils Relative: 1 %
Eosinophils Absolute: 0.2 10*3/uL (ref 0.0–0.5)
Eosinophils Relative: 5 %
HCT: 32.7 % — ABNORMAL LOW (ref 39.0–52.0)
Hemoglobin: 11.4 g/dL — ABNORMAL LOW (ref 13.0–17.0)
Immature Granulocytes: 1 %
Lymphocytes Relative: 15 %
Lymphs Abs: 0.5 10*3/uL — ABNORMAL LOW (ref 0.7–4.0)
MCH: 31.6 pg (ref 26.0–34.0)
MCHC: 34.9 g/dL (ref 30.0–36.0)
MCV: 90.6 fL (ref 80.0–100.0)
Monocytes Absolute: 0.5 10*3/uL (ref 0.1–1.0)
Monocytes Relative: 16 %
Neutro Abs: 1.9 10*3/uL (ref 1.7–7.7)
Neutrophils Relative %: 62 %
Platelet Count: 209 10*3/uL (ref 150–400)
RBC: 3.61 MIL/uL — ABNORMAL LOW (ref 4.22–5.81)
RDW: 12.8 % (ref 11.5–15.5)
WBC Count: 3.1 10*3/uL — ABNORMAL LOW (ref 4.0–10.5)
nRBC: 0 % (ref 0.0–0.2)

## 2021-07-31 LAB — CMP (CANCER CENTER ONLY)
ALT: 15 U/L (ref 0–44)
AST: 21 U/L (ref 15–41)
Albumin: 4 g/dL (ref 3.5–5.0)
Alkaline Phosphatase: 43 U/L (ref 38–126)
Anion gap: 7 (ref 5–15)
BUN: 11 mg/dL (ref 8–23)
CO2: 27 mmol/L (ref 22–32)
Calcium: 8.8 mg/dL — ABNORMAL LOW (ref 8.9–10.3)
Chloride: 101 mmol/L (ref 98–111)
Creatinine: 0.62 mg/dL (ref 0.61–1.24)
GFR, Estimated: >60 mL/min (ref >60)
Glucose, Bld: 125 mg/dL — ABNORMAL HIGH (ref 70–99)
Potassium: 4.1 mmol/L (ref 3.5–5.1)
Sodium: 135 mmol/L (ref 135–145)
Total Bilirubin: 0.3 mg/dL (ref 0.3–1.2)
Total Protein: 6.5 g/dL (ref 6.5–8.1)

## 2021-07-31 LAB — LACTATE DEHYDROGENASE: LDH: 206 U/L — ABNORMAL HIGH (ref 98–192)

## 2021-07-31 LAB — URIC ACID: Uric Acid, Serum: 3.5 mg/dL — ABNORMAL LOW (ref 3.7–8.6)

## 2021-07-31 MED ORDER — DOXORUBICIN HCL CHEMO IV INJECTION 2 MG/ML
25.0000 mg/m2 | Freq: Once | INTRAVENOUS | Status: AC
  Administered 2021-07-31: 48 mg via INTRAVENOUS
  Filled 2021-07-31: qty 24

## 2021-07-31 MED ORDER — SODIUM CHLORIDE 0.9 % IV SOLN
150.0000 mg | Freq: Once | INTRAVENOUS | Status: AC
  Administered 2021-07-31: 150 mg via INTRAVENOUS
  Filled 2021-07-31: qty 5

## 2021-07-31 MED ORDER — SODIUM CHLORIDE 0.9 % IV SOLN: Freq: Once | INTRAVENOUS | Status: AC

## 2021-07-31 MED ORDER — SODIUM CHLORIDE 0.9 % IV SOLN
375.0000 mg/m2 | Freq: Once | INTRAVENOUS | Status: AC
  Administered 2021-07-31: 700 mg via INTRAVENOUS
  Filled 2021-07-31: qty 50

## 2021-07-31 MED ORDER — SODIUM CHLORIDE 0.9 % IV SOLN
10.0000 mg | Freq: Once | INTRAVENOUS | Status: AC
  Administered 2021-07-31: 10 mg via INTRAVENOUS
  Filled 2021-07-31: qty 1

## 2021-07-31 MED ORDER — VINCRISTINE SULFATE CHEMO INJECTION 1 MG/ML
1.0000 mg | Freq: Once | INTRAVENOUS | Status: AC
  Administered 2021-07-31: 1 mg via INTRAVENOUS
  Filled 2021-07-31: qty 1

## 2021-07-31 MED ORDER — SODIUM CHLORIDE 0.9 % IV SOLN
400.0000 mg/m2 | Freq: Once | INTRAVENOUS | Status: AC
  Administered 2021-07-31: 780 mg via INTRAVENOUS
  Filled 2021-07-31: qty 39

## 2021-07-31 MED ORDER — PALONOSETRON HCL INJECTION 0.25 MG/5ML
0.2500 mg | Freq: Once | INTRAVENOUS | Status: AC
  Administered 2021-07-31: 0.25 mg via INTRAVENOUS
  Filled 2021-07-31: qty 5

## 2021-07-31 MED ORDER — DIPHENHYDRAMINE HCL 25 MG PO CAPS
50.0000 mg | ORAL_CAPSULE | Freq: Once | ORAL | Status: AC
  Administered 2021-07-31: 50 mg via ORAL
  Filled 2021-07-31: qty 2

## 2021-07-31 MED ORDER — ACETAMINOPHEN 325 MG PO TABS
650.0000 mg | ORAL_TABLET | Freq: Once | ORAL | Status: AC
  Administered 2021-07-31: 650 mg via ORAL
  Filled 2021-07-31: qty 2

## 2021-07-31 NOTE — Progress Notes (Signed)
[place] OFFICE PROGRESS NOTE   Diagnosis: Non-Hodgkin's lymphoma  INTERVAL HISTORY:   Dr. Annamaria Boots completed cycle 1 CHOP-rituximab.  No mouth sores, neuropathy symptoms, or rash.  He has intermittent nausea, relieved with Zofran.  The palpable lymph nodes resolved within 1 week of chemotherapy.  No change in exertional dyspnea.  Objective:  Vital signs in last 24 hours:  Blood pressure 140/83, pulse 93, temperature 97.9 F (36.6 C), temperature source Oral, resp. rate 18, height _0  (1.753 m), weight 169 lb (76.7 kg), SpO2 98 %.    HEENT: No thrush or ulcers Lymphatics: No cervical, supraclavicular, or axillary nodes Resp: Lungs with inspiratory rales at the right lower posterior chest, no respiratory distress Cardio: Regular rate and rhythm GI: No hepatosplenomegaly Vascular: No leg edema    Lab Results:  Lab Results  Component Value Date   WBC 3.1 (L) 07/31/2021   HGB 11.4 (L) 07/31/2021   HCT 32.7 (L) 07/31/2021   MCV 90.6 07/31/2021   PLT 209 07/31/2021   NEUTROABS 1.9 07/31/2021    CMP  Lab Results  Component Value Date   NA 135 07/31/2021   K 4.1 07/31/2021   CL 101 07/31/2021   CO2 27 07/31/2021   GLUCOSE 125 (H) 07/31/2021   BUN 11 07/31/2021   CREATININE 0.62 07/31/2021   CALCIUM 8.8 (L) 07/31/2021   PROT 6.5 07/31/2021   ALBUMIN 4.0 07/31/2021   AST 21 07/31/2021   ALT 15 07/31/2021   ALKPHOS 43 07/31/2021   BILITOT 0.3 07/31/2021   GFRNONAA >60 07/31/2021   GFRAA >60 06/01/2017     Medications: I have reviewed the patient's current medications.   Assessment/Plan: Non-Hodgkin's lymphoma CTs neck and chest 03/19/2021-cluster of left supraclavicular nodes measuring less than 1 cm, 12 mm right node next to the jugular vein, chest with slight interval progression of pulmonary fibrosis, new small sclerotic lesion in the right third rib concerning for metastasis, lucent lesion in T8 corresponding to abnormal uptake on a bone scan  05/21/2020 suspicious for malignancy, subpectoral nodes-subcentimeter in size but larger than 03/28/2019 FNA of left neck node 03/25/2021-atypical lymphoid proliferation Excisional biopsy of level 5 left neck node 04/07/2021-lymph node with marked autolysis and diffusely infiltrated by atypical lymphocytes, 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 Annamaria Boots tolerated the first cycle of chemotherapy well.  The palpable lymph nodes have resolved.  He  will complete cycle 2 today.  The  neutrophil count is borderline low.  He will call for a fever or symptoms of infection.  He will return for a nadir CBC. Dr. Annamaria Boots will return for an office visit and cycle 3R-mini CHOP on 08/21/2021.  He will be referred for a restaging PET scan after cycle 4.  [name], MD  07/31/2021  8:55 AM

## 2021-07-31 NOTE — Progress Notes (Signed)
Patient presents for treatment. RN assessment completed along with the following:  Labs/vitals reviewed - Yes, and within treatment parameters.   Weight within 10% of previous measurement - Yes Informed consent completed and reflects current therapy/intent - Yes, on date 07/10/21             Provider progress note reviewed - Today's provider note is not yet available. I reviewed the most recent oncology provider progress note in chart dated 07/10/21. Treatment/Antibody/Supportive plan reviewed - Yes, and there are no adjustments needed for today's treatment. S&H and other orders reviewed - Yes, and there are no additional orders identified. Previous treatment date reviewed - Yes, and the appropriate amount of time has elapsed between treatments. Clinic Hand Off Received from - Merceda Elks, RN  Patient to proceed with treatment.

## 2021-07-31 NOTE — Addendum Note (Signed)
Addended by: Tania Ade on: 07/31/2021 09:55 AM   Modules accepted: Orders

## 2021-07-31 NOTE — Progress Notes (Signed)
Patient seen by Dr. Sherrill today ? ?Vitals are within treatment parameters. ? ?Labs reviewed by Dr. Sherrill and are within treatment parameters. ? ?Per physician team, patient is ready for treatment and there are NO modifications to the treatment plan.  ?

## 2021-07-31 NOTE — Patient Instructions (Signed)
[facility]  Discharge Instructions: Thank you for choosing [facility] to provide your oncology and hematology care.   If you have a lab appointment with the [facility], please go directly to the [facility] and check in at the registration area.   Wear comfortable clothing and clothing appropriate for easy access to any Portacath or PICC line.   We strive to give you quality time with your provider. You may need to reschedule your appointment if you arrive late (15 or more minutes).  Arriving late affects you and other patients whose appointments are after yours.  Also, if you miss three or more appointments without notifying the office, you may be dismissed from the clinic at the providers discretion.      For prescription refill requests, have your pharmacy contact our office and allow 72 hours for refills to be completed.    Today you received the following chemotherapy and/or immunotherapy agents: doxorubicin, vincristine, cyclophosphamide, rituximab.       To help prevent nausea and vomiting after your treatment, we encourage you to take your nausea medication as directed.  BELOW ARE SYMPTOMS THAT SHOULD BE REPORTED IMMEDIATELY: *FEVER GREATER THAN 100.4 F (38 C) OR HIGHER *CHILLS OR SWEATING *NAUSEA AND VOMITING THAT IS NOT CONTROLLED WITH YOUR NAUSEA MEDICATION *UNUSUAL SHORTNESS OF BREATH *UNUSUAL BRUISING OR BLEEDING *URINARY PROBLEMS (pain or burning when urinating, or frequent urination) *BOWEL PROBLEMS (unusual diarrhea, constipation, pain near the anus) TENDERNESS IN MOUTH AND THROAT WITH OR WITHOUT PRESENCE OF ULCERS (sore throat, sores in mouth, or a toothache) UNUSUAL RASH, SWELLING OR PAIN  UNUSUAL VAGINAL DISCHARGE OR ITCHING   Items with * indicate a potential emergency and should be followed up as soon as possible or go to the Emergency Department if any problems should occur.  Please show the CHEMOTHERAPY ALERT CARD  or IMMUNOTHERAPY ALERT CARD at check-in to the Emergency Department and triage nurse.  Should you have questions after your visit or need to cancel or reschedule your appointment, please contact [facility]  Dept: [phone]  and follow the prompts.  Office hours are 8:00 a.m. to 4:30 p.m. Monday - Friday. Please note that voicemails left after 4:00 p.m. may not be returned until the following business day.  We are closed weekends and major holidays. You have access to a nurse at all times for urgent questions. Please call the main number to the clinic Dept: [phone] and follow the prompts.   For any non-urgent questions, you may also contact your provider using MyChart. We now offer e-Visits for anyone 27 and older to request care online for non-urgent symptoms. For details visit [email or web address].   Also download the MyChart app! Go to the app store, search "MyChart", open the app, select [facility], and log in with your MyChart username and password.  Due to Covid, a mask is required upon entering the hospital/clinic. If you do not have a mask, one will be given to you upon arrival. For doctor visits, patients may have 1 support person aged 64 or older with them. For treatment visits, patients cannot have anyone with them due to current Covid guidelines and our immunocompromised population.    Doxorubicin injection What is this medication? DOXORUBICIN (dox oh ROO bi sin) is a chemotherapy drug. It is used to treat many kinds of cancer like leukemia, lymphoma, neuroblastoma, sarcoma, and Wilms' tumor. It is also used to treat bladder cancer, breast cancer, lung  cancer, ovarian cancer, stomach cancer, and thyroid cancer. This medicine may be used for other purposes; ask your health care provider or pharmacist if you have questions. COMMON BRAND NAME(S): Adriamycin, Adriamycin PFS, Adriamycin RDF, Rubex What should I tell my care team before I take this  medication? They need to know if you have any of these conditions: heart disease history of low blood counts caused by a medicine liver disease recent or ongoing radiation therapy an unusual or allergic reaction to doxorubicin, other chemotherapy agents, other medicines, foods, dyes, or preservatives pregnant or trying to get pregnant breast-feeding How should I use this medication? This drug is given as an infusion into a vein. It is administered in a hospital or clinic by a specially trained health care professional. If you have pain, swelling, burning or any unusual feeling around the site of your injection, tell your health care professional right away. Talk to your pediatrician regarding the use of this medicine in children. Special care may be needed. Overdosage: If you think you have taken too much of this medicine contact a poison control center or emergency room at once. NOTE: This medicine is only for you. Do not share this medicine with others. What if I miss a dose? It is important not to miss your dose. Call your doctor or health care professional if you are unable to keep an appointment. What may interact with this medication? This medicine may interact with the following medications: 6-mercaptopurine paclitaxel phenytoin St. John's Wort trastuzumab verapamil This list may not describe all possible interactions. Give your health care provider a list of all the medicines, herbs, non-prescription drugs, or dietary supplements you use. Also tell them if you smoke, drink alcohol, or use illegal drugs. Some items may interact with your medicine. What should I watch for while using this medication? This drug may make you feel generally unwell. This is not uncommon, as chemotherapy can affect healthy cells as well as cancer cells. Report any side effects. Continue your course of treatment even though you feel ill unless your doctor tells you to stop. There is a maximum amount of  this medicine you should receive throughout your life. The amount depends on the medical condition being treated and your overall health. Your doctor will watch how much of this medicine you receive in your lifetime. Tell your doctor if you have taken this medicine before. You may need blood work done while you are taking this medicine. Your urine may turn red for a few days after your dose. This is not blood. If your urine is dark or brown, call your doctor. In some cases, you may be given additional medicines to help with side effects. Follow all directions for their use. Call your doctor or health care professional for advice if you get a fever, chills or sore throat, or other symptoms of a cold or flu. Do not treat yourself. This drug decreases your body's ability to fight infections. Try to avoid being around people who are sick. This medicine may increase your risk to bruise or bleed. Call your doctor or health care professional if you notice any unusual bleeding. Talk to your doctor about your risk of cancer. You may be more at risk for certain types of cancers if you take this medicine. Do not become pregnant while taking this medicine or for 6 months after stopping it. Women should inform their doctor if they wish to become pregnant or think they might be pregnant. Men should not father  a child while taking this medicine and for 6 months after stopping it. There is a potential for serious side effects to an unborn child. Talk to your health care professional or pharmacist for more information. Do not breast-feed an infant while taking this medicine. This medicine has caused ovarian failure in some women and reduced sperm counts in some men This medicine may interfere with the ability to have a child. Talk with your doctor or health care professional if you are concerned about your fertility. This medicine may cause a decrease in Co-Enzyme Q-10. You should make sure that you get enough Co-Enzyme  Q-10 while you are taking this medicine. Discuss the foods you eat and the vitamins you take with your health care professional. What side effects may I notice from receiving this medication? Side effects that you should report to your doctor or health care professional as soon as possible: allergic reactions like skin rash, itching or hives, swelling of the face, lips, or tongue breathing problems chest pain fast or irregular heartbeat low blood counts - this medicine may decrease the number of white blood cells, red blood cells and platelets. You may be at increased risk for infections and bleeding. pain, redness, or irritation at site where injected signs of infection - fever or chills, cough, sore throat, pain or difficulty passing urine signs of decreased platelets or bleeding - bruising, pinpoint red spots on the skin, black, tarry stools, blood in the urine swelling of the ankles, feet, hands tiredness weakness Side effects that usually do not require medical attention (report to your doctor or health care professional if they continue or are bothersome): diarrhea hair loss mouth sores nail discoloration or damage nausea red colored urine vomiting This list may not describe all possible side effects. Call your doctor for medical advice about side effects. You may report side effects to FDA at 1-800-FDA-1088. Where should I keep my medication? This drug is given in a hospital or clinic and will not be stored at home. NOTE: This sheet is a summary. It may not cover all possible information. If you have questions about this medicine, talk to your doctor, pharmacist, or health care provider.  2022 Elsevier/Gold Standard (2017-02-25 00:00:00)  Vincristine injection What is this medication? VINCRISTINE (vin KRIS teen) is a chemotherapy drug. It slows the growth of cancer cells. This medicine is used to treat many types of cancer like Hodgkin's disease, leukemia, non-Hodgkin's  lymphoma, neuroblastoma (brain cancer), rhabdomyosarcoma, and Wilms' tumor. This medicine may be used for other purposes; ask your health care provider or pharmacist if you have questions. COMMON BRAND NAME(S): Oncovin, Vincasar PFS What should I tell my care team before I take this medication? They need to know if you have any of these conditions: blood disorders gout infection (especially chickenpox, cold sores, or herpes) kidney disease liver disease lung disease nervous system disease like Charcot-Marie-Tooth (CMT) recent or ongoing radiation therapy an unusual or allergic reaction to vincristine, other chemotherapy agents, other medicines, foods, dyes, or preservatives pregnant or trying to get pregnant breast-feeding How should I use this medication? This drug is given as an infusion into a vein. It is administered in a hospital or clinic by a specially trained health care professional. If you have pain, swelling, burning, or any unusual feeling around the site of your injection, tell your health care professional right away. Talk to your pediatrician regarding the use of this medicine in children. While this drug may be prescribed for selected conditions, precautions  do apply. Overdosage: If you think you have taken too much of this medicine contact a poison control center or emergency room at once. NOTE: This medicine is only for you. Do not share this medicine with others. What if I miss a dose? It is important not to miss your dose. Call your doctor or health care professional if you are unable to keep an appointment. What may interact with this medication? certain medicines for fungal infections like itraconazole, ketoconazole, posaconazole, voriconazole certain medicines for seizures like phenytoin This list may not describe all possible interactions. Give your health care provider a list of all the medicines, herbs, non-prescription drugs, or dietary supplements you use. Also  tell them if you smoke, drink alcohol, or use illegal drugs. Some items may interact with your medicine. What should I watch for while using this medication? This drug may make you feel generally unwell. This is not uncommon, as chemotherapy can affect healthy cells as well as cancer cells. Report any side effects. Continue your course of treatment even though you feel ill unless your doctor tells you to stop. You may need blood work done while you are taking this medicine. This medicine will cause constipation. Try to have a bowel movement at least every 2 to 3 days. If you do not have a bowel movement for 3 days, call your doctor or health care professional. In some cases, you may be given additional medicines to help with side effects. Follow all directions for their use. Do not become pregnant while taking this medicine. Women should inform their doctor if they wish to become pregnant or think they might be pregnant. There is a potential for serious side effects to an unborn child. Talk to your health care professional or pharmacist for more information. Do not breast-feed an infant while taking this medicine. This medicine may make it more difficult to get pregnant or to father a child. Talk to your healthcare professional if you are concerned about your fertility. What side effects may I notice from receiving this medication? Side effects that you should report to your doctor or health care professional as soon as possible: allergic reactions like skin rash, itching or hives, swelling of the face, lips, or tongue breathing problems confusion or changes in emotions or moods constipation cough mouth sores muscle weakness nausea and vomiting pain, swelling, redness or irritation at the injection site pain, tingling, numbness in the hands or feet problems with balance, talking, walking seizures stomach pain trouble passing urine or change in the amount of urine Side effects that usually do  not require medical attention (report to your doctor or health care professional if they continue or are bothersome): diarrhea hair loss jaw pain loss of appetite This list may not describe all possible side effects. Call your doctor for medical advice about side effects. You may report side effects to FDA at 1-800-FDA-1088. Where should I keep my medication? This drug is given in a hospital or clinic and will not be stored at home. NOTE: This sheet is a summary. It may not cover all possible information. If you have questions about this medicine, talk to your doctor, pharmacist, or health care provider.  2022 Elsevier/Gold Standard (2021-03-11 00:00:00)  Cyclophosphamide Injection What is this medication? CYCLOPHOSPHAMIDE (sye kloe FOSS fa mide) is a chemotherapy drug. It slows the growth of cancer cells. This medicine is used to treat many types of cancer like lymphoma, myeloma, leukemia, breast cancer, and ovarian cancer, to name a few. This  medicine may be used for other purposes; ask your health care provider or pharmacist if you have questions. COMMON BRAND NAME(S): Cytoxan, Neosar What should I tell my care team before I take this medication? They need to know if you have any of these conditions: heart disease history of irregular heartbeat infection kidney disease liver disease low blood counts, like white cells, platelets, or red blood cells on hemodialysis recent or ongoing radiation therapy scarring or thickening of the lungs trouble passing urine an unusual or allergic reaction to cyclophosphamide, other medicines, foods, dyes, or preservatives pregnant or trying to get pregnant breast-feeding How should I use this medication? This drug is usually given as an injection into a vein or muscle or by infusion into a vein. It is administered in a hospital or clinic by a specially trained health care professional. Talk to your pediatrician regarding the use of this medicine in  children. Special care may be needed. Overdosage: If you think you have taken too much of this medicine contact a poison control center or emergency room at once. NOTE: This medicine is only for you. Do not share this medicine with others. What if I miss a dose? It is important not to miss your dose. Call your doctor or health care professional if you are unable to keep an appointment. What may interact with this medication? amphotericin B azathioprine certain antivirals for HIV or hepatitis certain medicines for blood pressure, heart disease, irregular heart beat certain medicines that treat or prevent blood clots like warfarin certain other medicines for cancer cyclosporine etanercept indomethacin medicines that relax muscles for surgery medicines to increase blood counts metronidazole This list may not describe all possible interactions. Give your health care provider a list of all the medicines, herbs, non-prescription drugs, or dietary supplements you use. Also tell them if you smoke, drink alcohol, or use illegal drugs. Some items may interact with your medicine. What should I watch for while using this medication? Your condition will be monitored carefully while you are receiving this medicine. You may need blood work done while you are taking this medicine. Drink water or other fluids as directed. Urinate often, even at night. Some products may contain alcohol. Ask your health care professional if this medicine contains alcohol. Be sure to tell all health care professionals you are taking this medicine. Certain medicines, like metronidazole and disulfiram, can cause an unpleasant reaction when taken with alcohol. The reaction includes flushing, headache, nausea, vomiting, sweating, and increased thirst. The reaction can last from 30 minutes to several hours. Do not become pregnant while taking this medicine or for 1 year after stopping it. Women should inform their health care  professional if they wish to become pregnant or think they might be pregnant. Men should not father a child while taking this medicine and for 4 months after stopping it. There is potential for serious side effects to an unborn child. Talk to your health care professional for more information. Do not breast-feed an infant while taking this medicine or for 1 week after stopping it. This medicine has caused ovarian failure in some women. This medicine may make it more difficult to get pregnant. Talk to your health care professional if you are concerned about your fertility. This medicine has caused decreased sperm counts in some men. This may make it more difficult to father a child. Talk to your health care professional if you are concerned about your fertility. Call your health care professional for advice if you get  a fever, chills, or sore throat, or other symptoms of a cold or flu. Do not treat yourself. This medicine decreases your body's ability to fight infections. Try to avoid being around people who are sick. Avoid taking medicines that contain aspirin, acetaminophen, ibuprofen, naproxen, or ketoprofen unless instructed by your health care professional. These medicines may hide a fever. Talk to your health care professional about your risk of cancer. You may be more at risk for certain types of cancer if you take this medicine. If you are going to need surgery or other procedure, tell your health care professional that you are using this medicine. Be careful brushing or flossing your teeth or using a toothpick because you may get an infection or bleed more easily. If you have any dental work done, tell your dentist you are receiving this medicine. What side effects may I notice from receiving this medication? Side effects that you should report to your doctor or health care professional as soon as possible: allergic reactions like skin rash, itching or hives, swelling of the face, lips, or  tongue breathing problems nausea, vomiting signs and symptoms of bleeding such as bloody or black, tarry stools; red or dark brown urine; spitting up blood or brown material that looks like coffee grounds; red spots on the skin; unusual bruising or bleeding from the eyes, gums, or nose signs and symptoms of heart failure like fast, irregular heartbeat, sudden weight gain; swelling of the ankles, feet, hands signs and symptoms of infection like fever; chills; cough; sore throat; pain or trouble passing urine signs and symptoms of kidney injury like trouble passing urine or change in the amount of urine signs and symptoms of liver injury like dark yellow or brown urine; general ill feeling or flu-like symptoms; light-colored stools; loss of appetite; nausea; right upper belly pain; unusually weak or tired; yellowing of the eyes or skin Side effects that usually do not require medical attention (report to your doctor or health care professional if they continue or are bothersome): confusion decreased hearing diarrhea facial flushing hair loss headache loss of appetite missed menstrual periods signs and symptoms of low red blood cells or anemia such as unusually weak or tired; feeling faint or lightheaded; falls skin discoloration This list may not describe all possible side effects. Call your doctor for medical advice about side effects. You may report side effects to FDA at 1-800-FDA-1088. Where should I keep my medication? This drug is given in a hospital or clinic and will not be stored at home. NOTE: This sheet is a summary. It may not cover all possible information. If you have questions about this medicine, talk to your doctor, pharmacist, or health care provider.  2022 Elsevier/Gold Standard (2021-03-11 00:00:00)  Rituximab Injection What is this medication? RITUXIMAB (ri TUX i mab) is a monoclonal antibody. It is used to treat certain types of cancer like non-Hodgkin lymphoma and  chronic lymphocytic leukemia. It is also used to treat rheumatoid arthritis, granulomatosis with polyangiitis, microscopic polyangiitis, and pemphigus vulgaris. This medicine may be used for other purposes; ask your health care provider or pharmacist if you have questions. COMMON BRAND NAME(S): RIABNI, Rituxan, RUXIENCE What should I tell my care team before I take this medication? They need to know if you have any of these conditions: chest pain heart disease infection especially a viral infection such as chickenpox, cold sores, hepatitis B, or herpes immune system problems irregular heartbeat or rhythm kidney disease low blood counts (white cells, platelets, or  red cells) lung disease recent or upcoming vaccine an unusual or allergic reaction to rituximab, other medicines, foods, dyes, or preservatives pregnant or trying to get pregnant breast-feeding How should I use this medication? This medicine is injected into a vein. It is given by a health care provider in a hospital or clinic setting. A special MedGuide will be given to you before each treatment. Be sure to read this information carefully each time. Talk to your health care provider about the use of this medicine in children. While this drug may be prescribed for children as [place] as 6 months for selected conditions, precautions do apply. Overdosage: If you think you have taken too much of this medicine contact a poison control center or emergency room at once. NOTE: This medicine is only for you. Do not share this medicine with others. What if I miss a dose? Keep appointments for follow-up doses. It is important not to miss your dose. Call your health care provider if you are unable to keep an appointment. What may interact with this medication? Do not take this medicine with any of the following medicines: live vaccines This medicine may also interact with the following medicines: cisplatin This list may not describe all  possible interactions. Give your health care provider a list of all the medicines, herbs, non-prescription drugs, or dietary supplements you use. Also tell them if you smoke, drink alcohol, or use illegal drugs. Some items may interact with your medicine. What should I watch for while using this medication? Your condition will be monitored carefully while you are receiving this medicine. You may need blood work done while you are taking this medicine. This medicine can cause serious infusion reactions. To reduce the risk your health care provider may give you other medicines to take before receiving this one. Be sure to follow the directions from your health care provider. This medicine may increase your risk of getting an infection. Call your health care provider for advice if you get a fever, chills, sore throat, or other symptoms of a cold or flu. Do not treat yourself. Try to avoid being around people who are sick. Call your health care provider if you are around anyone with measles, chickenpox, or if you develop sores or blisters that do not heal properly. Avoid taking medicines that contain aspirin, acetaminophen, ibuprofen, naproxen, or ketoprofen unless instructed by your health care provider. These medicines may hide a fever. This medicine may cause serious skin reactions. They can happen weeks to months after starting the medicine. Contact your health care provider right away if you notice fevers or flu-like symptoms with a rash. The rash may be red or purple and then turn into blisters or peeling of the skin. Or, you might notice a red rash with swelling of the face, lips or lymph nodes in your neck or under your arms. In some patients, this medicine may cause a serious brain infection that may cause death. If you have any problems seeing, thinking, speaking, walking, or standing, tell your healthcare professional right away. If you cannot reach your healthcare professional, urgently seek other  source of medical care. Do not become pregnant while taking this medicine or for at least 12 months after stopping it. Women should inform their health care provider if they wish to become pregnant or think they might be pregnant. There is potential for serious harm to an unborn child. Talk to your health care provider for more information. Women should use a reliable form of  birth control while taking this medicine and for 12 months after stopping it. Do not breast-feed while taking this medicine or for at least 6 months after stopping it. What side effects may I notice from receiving this medication? Side effects that you should report to your health care provider as soon as possible: allergic reactions (skin rash, itching or hives; swelling of the face, lips, or tongue) diarrhea edema (sudden weight gain; swelling of the ankles, feet, hands or other unusual swelling; trouble breathing) fast, irregular heartbeat heart attack (trouble breathing; pain or tightness in the chest, neck, back or arms; unusually weak or tired) infection (fever, chills, cough, sore throat, pain or trouble passing urine) kidney injury (trouble passing urine or change in the amount of urine) liver injury (dark yellow or brown urine; general ill feeling or flu-like symptoms; loss of appetite, right upper belly pain; unusually weak or tired, yellowing of the eyes or skin) low blood pressure (dizziness; feeling faint or lightheaded, falls; unusually weak or tired) low red blood cell counts (trouble breathing; feeling faint; lightheaded, falls; unusually weak or tired) mouth sores redness, blistering, peeling, or loosening of the skin, including inside the mouth stomach pain unusual bruising or bleeding wheezing (trouble breathing with loud or whistling sounds) vomiting Side effects that usually do not require medical attention (report to your health care provider if they continue or are bothersome): headache joint  pain muscle cramps, pain nausea This list may not describe all possible side effects. Call your doctor for medical advice about side effects. You may report side effects to FDA at 1-800-FDA-1088. Where should I keep my medication? This medicine is given in a hospital or clinic. It will not be stored at home. NOTE: This sheet is a summary. It may not cover all possible information. If you have questions about this medicine, talk to your doctor, pharmacist, or health care provider.  2022 [employer] (2020-06-24 00:00:00)

## 2021-08-01 NOTE — Progress Notes (Signed)
Discussed increased risk of extravisation when administering vesicants through PIV.  Patient verbalized understanding and agreed to proceed with treatment.  Instructed patient to alert RN if he started to feel increased coolness, burning, stinging at site of PIV.  Patient verbalized understanding.  Blood return was checked and confirmed before administration, during administration, and after administration of vesicants (Adriamycin and cytoxan).

## 2021-08-11 ENCOUNTER — Inpatient Hospital Stay: Payer: PPO | Attending: Oncology

## 2021-08-11 ENCOUNTER — Encounter (HOSPITAL_COMMUNITY): Payer: Self-pay | Admitting: Emergency Medicine

## 2021-08-11 ENCOUNTER — Other Ambulatory Visit: Payer: Self-pay

## 2021-08-11 DIAGNOSIS — Z5112 Encounter for antineoplastic immunotherapy: Secondary | ICD-10-CM | POA: Insufficient documentation

## 2021-08-11 DIAGNOSIS — Z79899 Other long term (current) drug therapy: Secondary | ICD-10-CM | POA: Insufficient documentation

## 2021-08-11 DIAGNOSIS — C8288 Other types of follicular lymphoma, lymph nodes of multiple sites: Secondary | ICD-10-CM | POA: Insufficient documentation

## 2021-08-11 DIAGNOSIS — Z5111 Encounter for antineoplastic chemotherapy: Secondary | ICD-10-CM | POA: Diagnosis not present

## 2021-08-11 DIAGNOSIS — C61 Malignant neoplasm of prostate: Secondary | ICD-10-CM | POA: Diagnosis not present

## 2021-08-11 DIAGNOSIS — C8330 Diffuse large B-cell lymphoma, unspecified site: Secondary | ICD-10-CM

## 2021-08-11 DIAGNOSIS — J8410 Pulmonary fibrosis, unspecified: Secondary | ICD-10-CM | POA: Insufficient documentation

## 2021-08-11 DIAGNOSIS — K648 Other hemorrhoids: Secondary | ICD-10-CM | POA: Diagnosis not present

## 2021-08-11 DIAGNOSIS — K589 Irritable bowel syndrome without diarrhea: Secondary | ICD-10-CM | POA: Diagnosis not present

## 2021-08-11 LAB — CBC WITH DIFFERENTIAL (CANCER CENTER ONLY)
Abs Immature Granulocytes: 0.02 10*3/uL (ref 0.00–0.07)
Basophils Absolute: 0 10*3/uL (ref 0.0–0.1)
Basophils Relative: 0 %
Eosinophils Absolute: 0.1 10*3/uL (ref 0.0–0.5)
Eosinophils Relative: 4 %
HCT: 31.7 % — ABNORMAL LOW (ref 39.0–52.0)
Hemoglobin: 11.1 g/dL — ABNORMAL LOW (ref 13.0–17.0)
Immature Granulocytes: 1 %
Lymphocytes Relative: 16 %
Lymphs Abs: 0.6 10*3/uL — ABNORMAL LOW (ref 0.7–4.0)
MCH: 31.7 pg (ref 26.0–34.0)
MCHC: 35 g/dL (ref 30.0–36.0)
MCV: 90.6 fL (ref 80.0–100.0)
Monocytes Absolute: 0.4 10*3/uL (ref 0.1–1.0)
Monocytes Relative: 11 %
Neutro Abs: 2.4 10*3/uL (ref 1.7–7.7)
Neutrophils Relative %: 68 %
Platelet Count: 185 10*3/uL (ref 150–400)
RBC: 3.5 MIL/uL — ABNORMAL LOW (ref 4.22–5.81)
RDW: 13.3 % (ref 11.5–15.5)
WBC Count: 3.6 10*3/uL — ABNORMAL LOW (ref 4.0–10.5)
nRBC: 0 % (ref 0.0–0.2)

## 2021-08-11 NOTE — Progress Notes (Signed)
PA approved through 08/11/2021-08/11/2022 for ondansetron 8 mg tablets.

## 2021-08-11 NOTE — Progress Notes (Signed)
PA submitted on ondansetron 8 mg, awaiting approval.

## 2021-08-17 ENCOUNTER — Other Ambulatory Visit: Payer: Self-pay | Admitting: Oncology

## 2021-08-21 ENCOUNTER — Inpatient Hospital Stay: Payer: PPO

## 2021-08-21 ENCOUNTER — Other Ambulatory Visit: Payer: Self-pay

## 2021-08-21 ENCOUNTER — Encounter: Payer: Self-pay | Admitting: *Deleted

## 2021-08-21 ENCOUNTER — Encounter: Payer: Self-pay | Admitting: Oncology

## 2021-08-21 ENCOUNTER — Inpatient Hospital Stay: Payer: PPO | Admitting: Oncology

## 2021-08-21 VITALS — BP 135/70 | HR 87 | Temp 97.4°F | Resp 18

## 2021-08-21 VITALS — BP 130/73 | HR 80 | Temp 97.8°F | Resp 18 | Ht 69.0 in | Wt 171.4 lb

## 2021-08-21 DIAGNOSIS — C8330 Diffuse large B-cell lymphoma, unspecified site: Secondary | ICD-10-CM

## 2021-08-21 DIAGNOSIS — Z5112 Encounter for antineoplastic immunotherapy: Secondary | ICD-10-CM | POA: Diagnosis not present

## 2021-08-21 LAB — CBC WITH DIFFERENTIAL (CANCER CENTER ONLY)
Abs Immature Granulocytes: 0.03 10*3/uL (ref 0.00–0.07)
Basophils Absolute: 0 10*3/uL (ref 0.0–0.1)
Basophils Relative: 1 %
Eosinophils Absolute: 0.2 10*3/uL (ref 0.0–0.5)
Eosinophils Relative: 6 %
HCT: 33.8 % — ABNORMAL LOW (ref 39.0–52.0)
Hemoglobin: 11.9 g/dL — ABNORMAL LOW (ref 13.0–17.0)
Immature Granulocytes: 1 %
Lymphocytes Relative: 16 %
Lymphs Abs: 0.5 10*3/uL — ABNORMAL LOW (ref 0.7–4.0)
MCH: 31.8 pg (ref 26.0–34.0)
MCHC: 35.2 g/dL (ref 30.0–36.0)
MCV: 90.4 fL (ref 80.0–100.0)
Monocytes Absolute: 0.5 10*3/uL (ref 0.1–1.0)
Monocytes Relative: 17 %
Neutro Abs: 1.9 10*3/uL (ref 1.7–7.7)
Neutrophils Relative %: 59 %
Platelet Count: 215 10*3/uL (ref 150–400)
RBC: 3.74 MIL/uL — ABNORMAL LOW (ref 4.22–5.81)
RDW: 13.4 % (ref 11.5–15.5)
WBC Count: 3.2 10*3/uL — ABNORMAL LOW (ref 4.0–10.5)
nRBC: 0 % (ref 0.0–0.2)

## 2021-08-21 LAB — CMP (CANCER CENTER ONLY)
ALT: 17 U/L (ref 0–44)
AST: 20 U/L (ref 15–41)
Albumin: 4 g/dL (ref 3.5–5.0)
Alkaline Phosphatase: 40 U/L (ref 38–126)
Anion gap: 9 (ref 5–15)
BUN: 9 mg/dL (ref 8–23)
CO2: 26 mmol/L (ref 22–32)
Calcium: 8.8 mg/dL — ABNORMAL LOW (ref 8.9–10.3)
Chloride: 100 mmol/L (ref 98–111)
Creatinine: 0.6 mg/dL — ABNORMAL LOW (ref 0.61–1.24)
GFR, Estimated: >60 mL/min (ref >60)
Glucose, Bld: 141 mg/dL — ABNORMAL HIGH (ref 70–99)
Potassium: 4.1 mmol/L (ref 3.5–5.1)
Sodium: 135 mmol/L (ref 135–145)
Total Bilirubin: 0.4 mg/dL (ref 0.3–1.2)
Total Protein: 6.7 g/dL (ref 6.5–8.1)

## 2021-08-21 LAB — LACTATE DEHYDROGENASE: LDH: 221 U/L — ABNORMAL HIGH (ref 98–192)

## 2021-08-21 MED ORDER — VINCRISTINE SULFATE CHEMO INJECTION 1 MG/ML
1.0000 mg | Freq: Once | INTRAVENOUS | Status: AC
  Administered 2021-08-21: 1 mg via INTRAVENOUS
  Filled 2021-08-21: qty 1

## 2021-08-21 MED ORDER — PALONOSETRON HCL INJECTION 0.25 MG/5ML
0.2500 mg | Freq: Once | INTRAVENOUS | Status: AC
  Administered 2021-08-21: 0.25 mg via INTRAVENOUS
  Filled 2021-08-21: qty 5

## 2021-08-21 MED ORDER — SODIUM CHLORIDE 0.9 % IV SOLN
10.0000 mg | Freq: Once | INTRAVENOUS | Status: AC
  Administered 2021-08-21: 10 mg via INTRAVENOUS
  Filled 2021-08-21: qty 1

## 2021-08-21 MED ORDER — DIPHENHYDRAMINE HCL 25 MG PO CAPS
50.0000 mg | ORAL_CAPSULE | Freq: Once | ORAL | Status: AC
  Administered 2021-08-21: 50 mg via ORAL
  Filled 2021-08-21: qty 2

## 2021-08-21 MED ORDER — SODIUM CHLORIDE 0.9 % IV SOLN
150.0000 mg | Freq: Once | INTRAVENOUS | Status: AC
  Administered 2021-08-21: 150 mg via INTRAVENOUS
  Filled 2021-08-21: qty 5

## 2021-08-21 MED ORDER — DOXORUBICIN HCL CHEMO IV INJECTION 2 MG/ML
25.0000 mg/m2 | Freq: Once | INTRAVENOUS | Status: AC
  Administered 2021-08-21: 48 mg via INTRAVENOUS
  Filled 2021-08-21: qty 24

## 2021-08-21 MED ORDER — ACETAMINOPHEN 325 MG PO TABS
650.0000 mg | ORAL_TABLET | Freq: Once | ORAL | Status: AC
  Administered 2021-08-21: 650 mg via ORAL
  Filled 2021-08-21: qty 2

## 2021-08-21 MED ORDER — SODIUM CHLORIDE 0.9 % IV SOLN: Freq: Once | INTRAVENOUS | Status: AC

## 2021-08-21 MED ORDER — SODIUM CHLORIDE 0.9 % IV SOLN
375.0000 mg/m2 | Freq: Once | INTRAVENOUS | Status: AC
  Administered 2021-08-21: 700 mg via INTRAVENOUS
  Filled 2021-08-21: qty 50

## 2021-08-21 MED ORDER — SODIUM CHLORIDE 0.9 % IV SOLN
400.0000 mg/m2 | Freq: Once | INTRAVENOUS | Status: AC
  Administered 2021-08-21: 780 mg via INTRAVENOUS
  Filled 2021-08-21: qty 39

## 2021-08-21 NOTE — Progress Notes (Signed)
Discussed increased risk of extravisation when administering vesicants through PIV.  Patient verbalized understanding and agreed to proceed with treatment.  Instructed patient to alert RN if he started to feel increased coolness, burning, stinging at site of PIV.  Patient verbalized understanding.  Blood return was checked and confirmed before administration, during administration, and after administration of vesicants Doxorubicin and Vincristine.

## 2021-08-21 NOTE — Patient Instructions (Signed)
[facility]   Discharge Instructions: Thank you for choosing [facility] to provide your oncology and hematology care.   If you have a lab appointment with the [facility], please go directly to the [facility] and check in at the registration area.   Wear comfortable clothing and clothing appropriate for easy access to any Portacath or PICC line.   We strive to give you quality time with your provider. You may need to reschedule your appointment if you arrive late (15 or more minutes).  Arriving late affects you and other patients whose appointments are after yours.  Also, if you miss three or more appointments without notifying the office, you may be dismissed from the clinic at the providers discretion.      For prescription refill requests, have your pharmacy contact our office and allow 72 hours for refills to be completed.    Today you received the following chemotherapy and/or immunotherapy agents Doxorubicin, Vincristine, Cytoxan, Rituximab      To help prevent nausea and vomiting after your treatment, we encourage you to take your nausea medication as directed.  BELOW ARE SYMPTOMS THAT SHOULD BE REPORTED IMMEDIATELY: *FEVER GREATER THAN 100.4 F (38 C) OR HIGHER *CHILLS OR SWEATING *NAUSEA AND VOMITING THAT IS NOT CONTROLLED WITH YOUR NAUSEA MEDICATION *UNUSUAL SHORTNESS OF BREATH *UNUSUAL BRUISING OR BLEEDING *URINARY PROBLEMS (pain or burning when urinating, or frequent urination) *BOWEL PROBLEMS (unusual diarrhea, constipation, pain near the anus) TENDERNESS IN MOUTH AND THROAT WITH OR WITHOUT PRESENCE OF ULCERS (sore throat, sores in mouth, or a toothache) UNUSUAL RASH, SWELLING OR PAIN  UNUSUAL VAGINAL DISCHARGE OR ITCHING   Items with * indicate a potential emergency and should be followed up as soon as possible or go to the Emergency Department if any problems should occur.  Please show the CHEMOTHERAPY ALERT CARD or  IMMUNOTHERAPY ALERT CARD at check-in to the Emergency Department and triage nurse.  Should you have questions after your visit or need to cancel or reschedule your appointment, please contact [facility]  Dept: [phone]  and follow the prompts.  Office hours are 8:00 a.m. to 4:30 p.m. Monday - Friday. Please note that voicemails left after 4:00 p.m. may not be returned until the following business day.  We are closed weekends and major holidays. You have access to a nurse at all times for urgent questions. Please call the main number to the clinic Dept: [phone] and follow the prompts.   For any non-urgent questions, you may also contact your provider using MyChart. We now offer e-Visits for anyone 3 and older to request care online for non-urgent symptoms. For details visit [email or web address].   Also download the MyChart app! Go to the app store, search "MyChart", open the app, select [facility], and log in with your MyChart username and password.  Due to Covid, a mask is required upon entering the hospital/clinic. If you do not have a mask, one will be given to you upon arrival. For doctor visits, patients may have 1 support person aged 80 or older with them. For treatment visits, patients cannot have anyone with them due to current Covid guidelines and our immunocompromised population.   Doxorubicin injection What is this medication? DOXORUBICIN (dox oh ROO bi sin) is a chemotherapy drug. It is used to treat many kinds of cancer like leukemia, lymphoma, neuroblastoma, sarcoma, and Wilms' tumor. It is also used to treat bladder cancer, breast cancer, lung cancer,  ovarian cancer, stomach cancer, and thyroid cancer. This medicine may be used for other purposes; ask your health care provider or pharmacist if you have questions. COMMON BRAND NAME(S): Adriamycin, Adriamycin PFS, Adriamycin RDF, Rubex What should I tell my care team before I take this  medication? They need to know if you have any of these conditions: heart disease history of low blood counts caused by a medicine liver disease recent or ongoing radiation therapy an unusual or allergic reaction to doxorubicin, other chemotherapy agents, other medicines, foods, dyes, or preservatives pregnant or trying to get pregnant breast-feeding How should I use this medication? This drug is given as an infusion into a vein. It is administered in a hospital or clinic by a specially trained health care professional. If you have pain, swelling, burning or any unusual feeling around the site of your injection, tell your health care professional right away. Talk to your pediatrician regarding the use of this medicine in children. Special care may be needed. Overdosage: If you think you have taken too much of this medicine contact a poison control center or emergency room at once. NOTE: This medicine is only for you. Do not share this medicine with others. What if I miss a dose? It is important not to miss your dose. Call your doctor or health care professional if you are unable to keep an appointment. What may interact with this medication? This medicine may interact with the following medications: 6-mercaptopurine paclitaxel phenytoin St. John's Wort trastuzumab verapamil This list may not describe all possible interactions. Give your health care provider a list of all the medicines, herbs, non-prescription drugs, or dietary supplements you use. Also tell them if you smoke, drink alcohol, or use illegal drugs. Some items may interact with your medicine. What should I watch for while using this medication? This drug may make you feel generally unwell. This is not uncommon, as chemotherapy can affect healthy cells as well as cancer cells. Report any side effects. Continue your course of treatment even though you feel ill unless your doctor tells you to stop. There is a maximum amount of  this medicine you should receive throughout your life. The amount depends on the medical condition being treated and your overall health. Your doctor will watch how much of this medicine you receive in your lifetime. Tell your doctor if you have taken this medicine before. You may need blood work done while you are taking this medicine. Your urine may turn red for a few days after your dose. This is not blood. If your urine is dark or brown, call your doctor. In some cases, you may be given additional medicines to help with side effects. Follow all directions for their use. Call your doctor or health care professional for advice if you get a fever, chills or sore throat, or other symptoms of a cold or flu. Do not treat yourself. This drug decreases your body's ability to fight infections. Try to avoid being around people who are sick. This medicine may increase your risk to bruise or bleed. Call your doctor or health care professional if you notice any unusual bleeding. Talk to your doctor about your risk of cancer. You may be more at risk for certain types of cancers if you take this medicine. Do not become pregnant while taking this medicine or for 6 months after stopping it. Women should inform their doctor if they wish to become pregnant or think they might be pregnant. Men should not father a  child while taking this medicine and for 6 months after stopping it. There is a potential for serious side effects to an unborn child. Talk to your health care professional or pharmacist for more information. Do not breast-feed an infant while taking this medicine. This medicine has caused ovarian failure in some women and reduced sperm counts in some men This medicine may interfere with the ability to have a child. Talk with your doctor or health care professional if you are concerned about your fertility. This medicine may cause a decrease in Co-Enzyme Q-10. You should make sure that you get enough Co-Enzyme  Q-10 while you are taking this medicine. Discuss the foods you eat and the vitamins you take with your health care professional. What side effects may I notice from receiving this medication? Side effects that you should report to your doctor or health care professional as soon as possible: allergic reactions like skin rash, itching or hives, swelling of the face, lips, or tongue breathing problems chest pain fast or irregular heartbeat low blood counts - this medicine may decrease the number of white blood cells, red blood cells and platelets. You may be at increased risk for infections and bleeding. pain, redness, or irritation at site where injected signs of infection - fever or chills, cough, sore throat, pain or difficulty passing urine signs of decreased platelets or bleeding - bruising, pinpoint red spots on the skin, black, tarry stools, blood in the urine swelling of the ankles, feet, hands tiredness weakness Side effects that usually do not require medical attention (report to your doctor or health care professional if they continue or are bothersome): diarrhea hair loss mouth sores nail discoloration or damage nausea red colored urine vomiting This list may not describe all possible side effects. Call your doctor for medical advice about side effects. You may report side effects to FDA at 1-800-FDA-1088. Where should I keep my medication? This drug is given in a hospital or clinic and will not be stored at home. NOTE: This sheet is a summary. It may not cover all possible information. If you have questions about this medicine, talk to your doctor, pharmacist, or health care provider.  2022 Elsevier/Gold Standard (2017-02-25 00:00:00)  Vincristine injection What is this medication? VINCRISTINE (vin KRIS teen) is a chemotherapy drug. It slows the growth of cancer cells. This medicine is used to treat many types of cancer like Hodgkin's disease, leukemia, non-Hodgkin's  lymphoma, neuroblastoma (brain cancer), rhabdomyosarcoma, and Wilms' tumor. This medicine may be used for other purposes; ask your health care provider or pharmacist if you have questions. COMMON BRAND NAME(S): Oncovin, Vincasar PFS What should I tell my care team before I take this medication? They need to know if you have any of these conditions: blood disorders gout infection (especially chickenpox, cold sores, or herpes) kidney disease liver disease lung disease nervous system disease like Charcot-Marie-Tooth (CMT) recent or ongoing radiation therapy an unusual or allergic reaction to vincristine, other chemotherapy agents, other medicines, foods, dyes, or preservatives pregnant or trying to get pregnant breast-feeding How should I use this medication? This drug is given as an infusion into a vein. It is administered in a hospital or clinic by a specially trained health care professional. If you have pain, swelling, burning, or any unusual feeling around the site of your injection, tell your health care professional right away. Talk to your pediatrician regarding the use of this medicine in children. While this drug may be prescribed for selected conditions, precautions do  apply. Overdosage: If you think you have taken too much of this medicine contact a poison control center or emergency room at once. NOTE: This medicine is only for you. Do not share this medicine with others. What if I miss a dose? It is important not to miss your dose. Call your doctor or health care professional if you are unable to keep an appointment. What may interact with this medication? certain medicines for fungal infections like itraconazole, ketoconazole, posaconazole, voriconazole certain medicines for seizures like phenytoin This list may not describe all possible interactions. Give your health care provider a list of all the medicines, herbs, non-prescription drugs, or dietary supplements you use. Also  tell them if you smoke, drink alcohol, or use illegal drugs. Some items may interact with your medicine. What should I watch for while using this medication? This drug may make you feel generally unwell. This is not uncommon, as chemotherapy can affect healthy cells as well as cancer cells. Report any side effects. Continue your course of treatment even though you feel ill unless your doctor tells you to stop. You may need blood work done while you are taking this medicine. This medicine will cause constipation. Try to have a bowel movement at least every 2 to 3 days. If you do not have a bowel movement for 3 days, call your doctor or health care professional. In some cases, you may be given additional medicines to help with side effects. Follow all directions for their use. Do not become pregnant while taking this medicine. Women should inform their doctor if they wish to become pregnant or think they might be pregnant. There is a potential for serious side effects to an unborn child. Talk to your health care professional or pharmacist for more information. Do not breast-feed an infant while taking this medicine. This medicine may make it more difficult to get pregnant or to father a child. Talk to your healthcare professional if you are concerned about your fertility. What side effects may I notice from receiving this medication? Side effects that you should report to your doctor or health care professional as soon as possible: allergic reactions like skin rash, itching or hives, swelling of the face, lips, or tongue breathing problems confusion or changes in emotions or moods constipation cough mouth sores muscle weakness nausea and vomiting pain, swelling, redness or irritation at the injection site pain, tingling, numbness in the hands or feet problems with balance, talking, walking seizures stomach pain trouble passing urine or change in the amount of urine Side effects that usually do  not require medical attention (report to your doctor or health care professional if they continue or are bothersome): diarrhea hair loss jaw pain loss of appetite This list may not describe all possible side effects. Call your doctor for medical advice about side effects. You may report side effects to FDA at 1-800-FDA-1088. Where should I keep my medication? This drug is given in a hospital or clinic and will not be stored at home. NOTE: This sheet is a summary. It may not cover all possible information. If you have questions about this medicine, talk to your doctor, pharmacist, or health care provider.  2022 Elsevier/Gold Standard (2021-03-11 00:00:00)  Cyclophosphamide Injection What is this medication? CYCLOPHOSPHAMIDE (sye kloe FOSS fa mide) is a chemotherapy drug. It slows the growth of cancer cells. This medicine is used to treat many types of cancer like lymphoma, myeloma, leukemia, breast cancer, and ovarian cancer, to name a few. This medicine  may be used for other purposes; ask your health care provider or pharmacist if you have questions. COMMON BRAND NAME(S): Cytoxan, Neosar What should I tell my care team before I take this medication? They need to know if you have any of these conditions: heart disease history of irregular heartbeat infection kidney disease liver disease low blood counts, like white cells, platelets, or red blood cells on hemodialysis recent or ongoing radiation therapy scarring or thickening of the lungs trouble passing urine an unusual or allergic reaction to cyclophosphamide, other medicines, foods, dyes, or preservatives pregnant or trying to get pregnant breast-feeding How should I use this medication? This drug is usually given as an injection into a vein or muscle or by infusion into a vein. It is administered in a hospital or clinic by a specially trained health care professional. Talk to your pediatrician regarding the use of this medicine in  children. Special care may be needed. Overdosage: If you think you have taken too much of this medicine contact a poison control center or emergency room at once. NOTE: This medicine is only for you. Do not share this medicine with others. What if I miss a dose? It is important not to miss your dose. Call your doctor or health care professional if you are unable to keep an appointment. What may interact with this medication? amphotericin B azathioprine certain antivirals for HIV or hepatitis certain medicines for blood pressure, heart disease, irregular heart beat certain medicines that treat or prevent blood clots like warfarin certain other medicines for cancer cyclosporine etanercept indomethacin medicines that relax muscles for surgery medicines to increase blood counts metronidazole This list may not describe all possible interactions. Give your health care provider a list of all the medicines, herbs, non-prescription drugs, or dietary supplements you use. Also tell them if you smoke, drink alcohol, or use illegal drugs. Some items may interact with your medicine. What should I watch for while using this medication? Your condition will be monitored carefully while you are receiving this medicine. You may need blood work done while you are taking this medicine. Drink water or other fluids as directed. Urinate often, even at night. Some products may contain alcohol. Ask your health care professional if this medicine contains alcohol. Be sure to tell all health care professionals you are taking this medicine. Certain medicines, like metronidazole and disulfiram, can cause an unpleasant reaction when taken with alcohol. The reaction includes flushing, headache, nausea, vomiting, sweating, and increased thirst. The reaction can last from 30 minutes to several hours. Do not become pregnant while taking this medicine or for 1 year after stopping it. Women should inform their health care  professional if they wish to become pregnant or think they might be pregnant. Men should not father a child while taking this medicine and for 4 months after stopping it. There is potential for serious side effects to an unborn child. Talk to your health care professional for more information. Do not breast-feed an infant while taking this medicine or for 1 week after stopping it. This medicine has caused ovarian failure in some women. This medicine may make it more difficult to get pregnant. Talk to your health care professional if you are concerned about your fertility. This medicine has caused decreased sperm counts in some men. This may make it more difficult to father a child. Talk to your health care professional if you are concerned about your fertility. Call your health care professional for advice if you get a  fever, chills, or sore throat, or other symptoms of a cold or flu. Do not treat yourself. This medicine decreases your body's ability to fight infections. Try to avoid being around people who are sick. Avoid taking medicines that contain aspirin, acetaminophen, ibuprofen, naproxen, or ketoprofen unless instructed by your health care professional. These medicines may hide a fever. Talk to your health care professional about your risk of cancer. You may be more at risk for certain types of cancer if you take this medicine. If you are going to need surgery or other procedure, tell your health care professional that you are using this medicine. Be careful brushing or flossing your teeth or using a toothpick because you may get an infection or bleed more easily. If you have any dental work done, tell your dentist you are receiving this medicine. What side effects may I notice from receiving this medication? Side effects that you should report to your doctor or health care professional as soon as possible: allergic reactions like skin rash, itching or hives, swelling of the face, lips, or  tongue breathing problems nausea, vomiting signs and symptoms of bleeding such as bloody or black, tarry stools; red or dark brown urine; spitting up blood or brown material that looks like coffee grounds; red spots on the skin; unusual bruising or bleeding from the eyes, gums, or nose signs and symptoms of heart failure like fast, irregular heartbeat, sudden weight gain; swelling of the ankles, feet, hands signs and symptoms of infection like fever; chills; cough; sore throat; pain or trouble passing urine signs and symptoms of kidney injury like trouble passing urine or change in the amount of urine signs and symptoms of liver injury like dark yellow or brown urine; general ill feeling or flu-like symptoms; light-colored stools; loss of appetite; nausea; right upper belly pain; unusually weak or tired; yellowing of the eyes or skin Side effects that usually do not require medical attention (report to your doctor or health care professional if they continue or are bothersome): confusion decreased hearing diarrhea facial flushing hair loss headache loss of appetite missed menstrual periods signs and symptoms of low red blood cells or anemia such as unusually weak or tired; feeling faint or lightheaded; falls skin discoloration This list may not describe all possible side effects. Call your doctor for medical advice about side effects. You may report side effects to FDA at 1-800-FDA-1088. Where should I keep my medication? This drug is given in a hospital or clinic and will not be stored at home. NOTE: This sheet is a summary. It may not cover all possible information. If you have questions about this medicine, talk to your doctor, pharmacist, or health care provider.  2022 Elsevier/Gold Standard (2021-03-11 00:00:00)  Rituximab Injection What is this medication? RITUXIMAB (ri TUX i mab) is a monoclonal antibody. It is used to treat certain types of cancer like non-Hodgkin lymphoma and  chronic lymphocytic leukemia. It is also used to treat rheumatoid arthritis, granulomatosis with polyangiitis, microscopic polyangiitis, and pemphigus vulgaris. This medicine may be used for other purposes; ask your health care provider or pharmacist if you have questions. COMMON BRAND NAME(S): RIABNI, Rituxan, RUXIENCE What should I tell my care team before I take this medication? They need to know if you have any of these conditions: chest pain heart disease infection especially a viral infection such as chickenpox, cold sores, hepatitis B, or herpes immune system problems irregular heartbeat or rhythm kidney disease low blood counts (white cells, platelets, or red  cells) lung disease recent or upcoming vaccine an unusual or allergic reaction to rituximab, other medicines, foods, dyes, or preservatives pregnant or trying to get pregnant breast-feeding How should I use this medication? This medicine is injected into a vein. It is given by a health care provider in a hospital or clinic setting. A special MedGuide will be given to you before each treatment. Be sure to read this information carefully each time. Talk to your health care provider about the use of this medicine in children. While this drug may be prescribed for children as Coley as 6 months for selected conditions, precautions do apply. Overdosage: If you think you have taken too much of this medicine contact a poison control center or emergency room at once. NOTE: This medicine is only for you. Do not share this medicine with others. What if I miss a dose? Keep appointments for follow-up doses. It is important not to miss your dose. Call your health care provider if you are unable to keep an appointment. What may interact with this medication? Do not take this medicine with any of the following medicines: live vaccines This medicine may also interact with the following medicines: cisplatin This list may not describe all  possible interactions. Give your health care provider a list of all the medicines, herbs, non-prescription drugs, or dietary supplements you use. Also tell them if you smoke, drink alcohol, or use illegal drugs. Some items may interact with your medicine. What should I watch for while using this medication? Your condition will be monitored carefully while you are receiving this medicine. You may need blood work done while you are taking this medicine. This medicine can cause serious infusion reactions. To reduce the risk your health care provider may give you other medicines to take before receiving this one. Be sure to follow the directions from your health care provider. This medicine may increase your risk of getting an infection. Call your health care provider for advice if you get a fever, chills, sore throat, or other symptoms of a cold or flu. Do not treat yourself. Try to avoid being around people who are sick. Call your health care provider if you are around anyone with measles, chickenpox, or if you develop sores or blisters that do not heal properly. Avoid taking medicines that contain aspirin, acetaminophen, ibuprofen, naproxen, or ketoprofen unless instructed by your health care provider. These medicines may hide a fever. This medicine may cause serious skin reactions. They can happen weeks to months after starting the medicine. Contact your health care provider right away if you notice fevers or flu-like symptoms with a rash. The rash may be red or purple and then turn into blisters or peeling of the skin. Or, you might notice a red rash with swelling of the face, lips or lymph nodes in your neck or under your arms. In some patients, this medicine may cause a serious brain infection that may cause death. If you have any problems seeing, thinking, speaking, walking, or standing, tell your healthcare professional right away. If you cannot reach your healthcare professional, urgently seek other  source of medical care. Do not become pregnant while taking this medicine or for at least 12 months after stopping it. Women should inform their health care provider if they wish to become pregnant or think they might be pregnant. There is potential for serious harm to an unborn child. Talk to your health care provider for more information. Women should use a reliable form of birth  control while taking this medicine and for 12 months after stopping it. Do not breast-feed while taking this medicine or for at least 6 months after stopping it. What side effects may I notice from receiving this medication? Side effects that you should report to your health care provider as soon as possible: allergic reactions (skin rash, itching or hives; swelling of the face, lips, or tongue) diarrhea edema (sudden weight gain; swelling of the ankles, feet, hands or other unusual swelling; trouble breathing) fast, irregular heartbeat heart attack (trouble breathing; pain or tightness in the chest, neck, back or arms; unusually weak or tired) infection (fever, chills, cough, sore throat, pain or trouble passing urine) kidney injury (trouble passing urine or change in the amount of urine) liver injury (dark yellow or brown urine; general ill feeling or flu-like symptoms; loss of appetite, right upper belly pain; unusually weak or tired, yellowing of the eyes or skin) low blood pressure (dizziness; feeling faint or lightheaded, falls; unusually weak or tired) low red blood cell counts (trouble breathing; feeling faint; lightheaded, falls; unusually weak or tired) mouth sores redness, blistering, peeling, or loosening of the skin, including inside the mouth stomach pain unusual bruising or bleeding wheezing (trouble breathing with loud or whistling sounds) vomiting Side effects that usually do not require medical attention (report to your health care provider if they continue or are bothersome): headache joint  pain muscle cramps, pain nausea This list may not describe all possible side effects. Call your doctor for medical advice about side effects. You may report side effects to FDA at 1-800-FDA-1088. Where should I keep my medication? This medicine is given in a hospital or clinic. It will not be stored at home. NOTE: This sheet is a summary. It may not cover all possible information. If you have questions about this medicine, talk to your doctor, pharmacist, or health care provider.  2022 [employer] (2020-06-24 00:00:00)

## 2021-08-21 NOTE — Progress Notes (Signed)
Patient seen by Dr. Sherrill today ? ?Vitals are within treatment parameters. ? ?Labs reviewed by Dr. Sherrill and are within treatment parameters. ? ?Per physician team, patient is ready for treatment and there are NO modifications to the treatment plan.  ?

## 2021-08-21 NOTE — Progress Notes (Signed)
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  No nausea/vomiting, mouth sores, rash, or neuropathy symptoms.  No palpable lymph nodes.  He reports malaise.  He believes this is related to multiple factors including chemotherapy, androgen deprivation, and pulmonary fibrosis.  He continues to exercise.  Objective:  Vital signs in last 24 hours:  Blood pressure 130/73, pulse 80, temperature 97.8 F (36.6 C), temperature source Oral, resp. rate 18, height _0  (1.753 m), weight 171 lb 6.4 oz (77.7 kg), SpO2 97 %.    HEENT: No thrush or ulcers Lymphatics: No cervical, supraclavicular, axillary, or inguinal nodes Resp: Lungs with end inspiratory rales at the right greater than left lower posterior chest, no respiratory distress Cardio: Regular rate and rhythm GI: No hepatosplenomegaly Vascular: No leg edema   Lab Results:  Lab Results  Component Value Date   WBC 3.2 (L) 08/21/2021   HGB 11.9 (L) 08/21/2021   HCT 33.8 (L) 08/21/2021   MCV 90.4 08/21/2021   PLT 215 08/21/2021   NEUTROABS 1.9 08/21/2021    CMP  Lab Results  Component Value Date   NA 135 08/21/2021   K 4.1 08/21/2021   CL 100 08/21/2021   CO2 26 08/21/2021   GLUCOSE 141 (H) 08/21/2021   BUN 9 08/21/2021   CREATININE 0.60 (L) 08/21/2021   CALCIUM 8.8 (L) 08/21/2021   PROT 6.7 08/21/2021   ALBUMIN 4.0 08/21/2021   AST 20 08/21/2021   ALT 17 08/21/2021   ALKPHOS 40 08/21/2021   BILITOT 0.4 08/21/2021   GFRNONAA >60 08/21/2021   GFRAA >60 06/01/2017     Medications: I have reviewed the patient's current medications.   Assessment/Plan: Non-Hodgkin's lymphoma CTs neck and chest 03/19/2021-cluster of left supraclavicular nodes measuring less than 1 cm, 12 mm right node next to the jugular vein, chest with slight interval progression of pulmonary fibrosis, new small sclerotic lesion in the  right third rib concerning for metastasis, lucent lesion in T8 corresponding to abnormal uptake on a bone scan 05/21/2020 suspicious for malignancy, subpectoral nodes-subcentimeter in size but larger than 03/28/2019 FNA of left neck node 03/25/2021-atypical lymphoid proliferation Excisional biopsy of level 5 left neck node 04/07/2021-lymph node with marked autolysis and diffusely infiltrated by atypical lymphocytes, 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  He has completed 2 cycles of R-mini CHOP.  He has tolerated the chemotherapy well.  He will complete cycle 3 today.  The plan is to refer him for a restaging PET after cycle 4.  Palpable lymphadenopathy has resolved.  He will return for an office visit and cycle 4R-mini CHOP in 3 weeks.  [name], MD  08/21/2021  9:32 AM

## 2021-08-21 NOTE — Progress Notes (Signed)
Patient presents for treatment. RN assessment completed along with the following:  Labs/vitals reviewed - Yes, and within treatment parameters.   Weight within 10% of previous measurement - Yes Informed consent completed and reflects current therapy/intent - Yes, on date 07/10/21             Provider progress note reviewed - Today's provider note is not yet available. I reviewed the most recent oncology provider progress note in chart dated 07/31/21. Treatment/Antibody/Supportive plan reviewed - Yes, and there are no adjustments needed for today's treatment. S&H and other orders reviewed - Yes, and there are no additional orders identified. Previous treatment date reviewed - Yes, and the appropriate amount of time has elapsed between treatments. Clinic Hand Off Received from - Cristy Friedlander, RN  Patient to proceed with treatment.

## 2021-08-29 DIAGNOSIS — K219 Gastro-esophageal reflux disease without esophagitis: Secondary | ICD-10-CM | POA: Diagnosis not present

## 2021-08-29 DIAGNOSIS — E785 Hyperlipidemia, unspecified: Secondary | ICD-10-CM | POA: Diagnosis not present

## 2021-08-29 DIAGNOSIS — I2510 Atherosclerotic heart disease of native coronary artery without angina pectoris: Secondary | ICD-10-CM | POA: Diagnosis not present

## 2021-08-29 DIAGNOSIS — C8590 Non-Hodgkin lymphoma, unspecified, unspecified site: Secondary | ICD-10-CM | POA: Diagnosis not present

## 2021-08-29 DIAGNOSIS — I712 Thoracic aortic aneurysm, without rupture, unspecified: Secondary | ICD-10-CM | POA: Diagnosis not present

## 2021-08-29 DIAGNOSIS — J302 Other seasonal allergic rhinitis: Secondary | ICD-10-CM | POA: Diagnosis not present

## 2021-08-29 DIAGNOSIS — M810 Age-related osteoporosis without current pathological fracture: Secondary | ICD-10-CM | POA: Diagnosis not present

## 2021-08-29 DIAGNOSIS — J8410 Pulmonary fibrosis, unspecified: Secondary | ICD-10-CM | POA: Diagnosis not present

## 2021-09-08 ENCOUNTER — Other Ambulatory Visit: Payer: Self-pay | Admitting: Oncology

## 2021-09-11 ENCOUNTER — Inpatient Hospital Stay: Payer: PPO | Attending: Oncology

## 2021-09-11 ENCOUNTER — Inpatient Hospital Stay: Payer: PPO | Admitting: Oncology

## 2021-09-11 ENCOUNTER — Encounter: Payer: Self-pay | Admitting: *Deleted

## 2021-09-11 ENCOUNTER — Inpatient Hospital Stay: Payer: PPO

## 2021-09-11 ENCOUNTER — Other Ambulatory Visit: Payer: Self-pay

## 2021-09-11 ENCOUNTER — Encounter: Payer: Self-pay | Admitting: Oncology

## 2021-09-11 VITALS — BP 141/76 | HR 85 | Temp 97.1°F | Resp 18

## 2021-09-11 VITALS — BP 120/70 | HR 91 | Temp 98.1°F | Resp 18 | Ht 69.0 in | Wt 172.2 lb

## 2021-09-11 DIAGNOSIS — K5730 Diverticulosis of large intestine without perforation or abscess without bleeding: Secondary | ICD-10-CM | POA: Insufficient documentation

## 2021-09-11 DIAGNOSIS — K648 Other hemorrhoids: Secondary | ICD-10-CM | POA: Diagnosis not present

## 2021-09-11 DIAGNOSIS — I3481 Nonrheumatic mitral (valve) annulus calcification: Secondary | ICD-10-CM | POA: Diagnosis not present

## 2021-09-11 DIAGNOSIS — C8330 Diffuse large B-cell lymphoma, unspecified site: Secondary | ICD-10-CM

## 2021-09-11 DIAGNOSIS — Z79899 Other long term (current) drug therapy: Secondary | ICD-10-CM | POA: Insufficient documentation

## 2021-09-11 DIAGNOSIS — I700 Atherosclerosis of aorta: Secondary | ICD-10-CM | POA: Diagnosis not present

## 2021-09-11 DIAGNOSIS — Z5111 Encounter for antineoplastic chemotherapy: Secondary | ICD-10-CM | POA: Diagnosis not present

## 2021-09-11 DIAGNOSIS — R0609 Other forms of dyspnea: Secondary | ICD-10-CM | POA: Diagnosis not present

## 2021-09-11 DIAGNOSIS — C61 Malignant neoplasm of prostate: Secondary | ICD-10-CM | POA: Diagnosis not present

## 2021-09-11 DIAGNOSIS — K8020 Calculus of gallbladder without cholecystitis without obstruction: Secondary | ICD-10-CM | POA: Diagnosis not present

## 2021-09-11 DIAGNOSIS — K589 Irritable bowel syndrome without diarrhea: Secondary | ICD-10-CM | POA: Diagnosis not present

## 2021-09-11 DIAGNOSIS — J8410 Pulmonary fibrosis, unspecified: Secondary | ICD-10-CM | POA: Insufficient documentation

## 2021-09-11 DIAGNOSIS — C8288 Other types of follicular lymphoma, lymph nodes of multiple sites: Secondary | ICD-10-CM | POA: Insufficient documentation

## 2021-09-11 DIAGNOSIS — Z5112 Encounter for antineoplastic immunotherapy: Secondary | ICD-10-CM | POA: Diagnosis not present

## 2021-09-11 DIAGNOSIS — M4180 Other forms of scoliosis, site unspecified: Secondary | ICD-10-CM | POA: Insufficient documentation

## 2021-09-11 DIAGNOSIS — R232 Flushing: Secondary | ICD-10-CM | POA: Insufficient documentation

## 2021-09-11 LAB — CBC WITH DIFFERENTIAL (CANCER CENTER ONLY)
Abs Immature Granulocytes: 0.02 10*3/uL (ref 0.00–0.07)
Basophils Absolute: 0 10*3/uL (ref 0.0–0.1)
Basophils Relative: 1 %
Eosinophils Absolute: 0.2 10*3/uL (ref 0.0–0.5)
Eosinophils Relative: 6 %
HCT: 31.4 % — ABNORMAL LOW (ref 39.0–52.0)
Hemoglobin: 11.3 g/dL — ABNORMAL LOW (ref 13.0–17.0)
Immature Granulocytes: 1 %
Lymphocytes Relative: 18 %
Lymphs Abs: 0.5 10*3/uL — ABNORMAL LOW (ref 0.7–4.0)
MCH: 32.3 pg (ref 26.0–34.0)
MCHC: 36 g/dL (ref 30.0–36.0)
MCV: 89.7 fL (ref 80.0–100.0)
Monocytes Absolute: 0.5 10*3/uL (ref 0.1–1.0)
Monocytes Relative: 17 %
Neutro Abs: 1.7 10*3/uL (ref 1.7–7.7)
Neutrophils Relative %: 57 %
Platelet Count: 205 10*3/uL (ref 150–400)
RBC: 3.5 MIL/uL — ABNORMAL LOW (ref 4.22–5.81)
RDW: 13.9 % (ref 11.5–15.5)
WBC Count: 3 10*3/uL — ABNORMAL LOW (ref 4.0–10.5)
nRBC: 0 % (ref 0.0–0.2)

## 2021-09-11 LAB — CMP (CANCER CENTER ONLY)
ALT: 14 U/L (ref 0–44)
AST: 20 U/L (ref 15–41)
Albumin: 4.1 g/dL (ref 3.5–5.0)
Alkaline Phosphatase: 42 U/L (ref 38–126)
Anion gap: 8 (ref 5–15)
BUN: 12 mg/dL (ref 8–23)
CO2: 26 mmol/L (ref 22–32)
Calcium: 9.3 mg/dL (ref 8.9–10.3)
Chloride: 100 mmol/L (ref 98–111)
Creatinine: 0.66 mg/dL (ref 0.61–1.24)
GFR, Estimated: >60 mL/min (ref >60)
Glucose, Bld: 131 mg/dL — ABNORMAL HIGH (ref 70–99)
Potassium: 4 mmol/L (ref 3.5–5.1)
Sodium: 134 mmol/L — ABNORMAL LOW (ref 135–145)
Total Bilirubin: 0.4 mg/dL (ref 0.3–1.2)
Total Protein: 6.7 g/dL (ref 6.5–8.1)

## 2021-09-11 LAB — LACTATE DEHYDROGENASE: LDH: 234 U/L — ABNORMAL HIGH (ref 98–192)

## 2021-09-11 MED ORDER — SODIUM CHLORIDE 0.9 % IV SOLN
375.0000 mg/m2 | Freq: Once | INTRAVENOUS | Status: AC
  Administered 2021-09-11: 13:00:00 700 mg via INTRAVENOUS
  Filled 2021-09-11: qty 50

## 2021-09-11 MED ORDER — SODIUM CHLORIDE 0.9 % IV SOLN
150.0000 mg | Freq: Once | INTRAVENOUS | Status: AC
  Administered 2021-09-11: 10:00:00 150 mg via INTRAVENOUS
  Filled 2021-09-11: qty 5

## 2021-09-11 MED ORDER — SODIUM CHLORIDE 0.9 % IV SOLN: Freq: Once | INTRAVENOUS | Status: AC

## 2021-09-11 MED ORDER — VINCRISTINE SULFATE CHEMO INJECTION 1 MG/ML
1.0000 mg | Freq: Once | INTRAVENOUS | Status: AC
  Administered 2021-09-11: 12:00:00 1 mg via INTRAVENOUS
  Filled 2021-09-11: qty 1

## 2021-09-11 MED ORDER — PALONOSETRON HCL INJECTION 0.25 MG/5ML
0.2500 mg | Freq: Once | INTRAVENOUS | Status: AC
  Administered 2021-09-11: 10:00:00 0.25 mg via INTRAVENOUS
  Filled 2021-09-11: qty 5

## 2021-09-11 MED ORDER — ONDANSETRON HCL 8 MG PO TABS
8.0000 mg | ORAL_TABLET | Freq: Three times a day (TID) | ORAL | 1 refills | Status: DC | PRN
Start: 2021-09-11 — End: 2021-10-02

## 2021-09-11 MED ORDER — SODIUM CHLORIDE 0.9 % IV SOLN
400.0000 mg/m2 | Freq: Once | INTRAVENOUS | Status: AC
  Administered 2021-09-11: 12:00:00 780 mg via INTRAVENOUS
  Filled 2021-09-11: qty 39

## 2021-09-11 MED ORDER — ACETAMINOPHEN 325 MG PO TABS
650.0000 mg | ORAL_TABLET | Freq: Once | ORAL | Status: AC
  Administered 2021-09-11: 09:00:00 650 mg via ORAL
  Filled 2021-09-11: qty 2

## 2021-09-11 MED ORDER — DIPHENHYDRAMINE HCL 25 MG PO CAPS
50.0000 mg | ORAL_CAPSULE | Freq: Once | ORAL | Status: AC
  Administered 2021-09-11: 09:00:00 50 mg via ORAL
  Filled 2021-09-11: qty 2

## 2021-09-11 MED ORDER — SODIUM CHLORIDE 0.9 % IV SOLN
10.0000 mg | Freq: Once | INTRAVENOUS | Status: AC
  Administered 2021-09-11: 09:00:00 10 mg via INTRAVENOUS
  Filled 2021-09-11: qty 1

## 2021-09-11 MED ORDER — DOXORUBICIN HCL CHEMO IV INJECTION 2 MG/ML
25.0000 mg/m2 | Freq: Once | INTRAVENOUS | Status: AC
  Administered 2021-09-11: 11:00:00 48 mg via INTRAVENOUS
  Filled 2021-09-11: qty 24

## 2021-09-11 NOTE — Patient Instructions (Addendum)
Kenneth [name]   Discharge Instructions: Thank you for choosing [facility] to provide your oncology and hematology care.   If you have a lab appointment with the [facility], please go directly to the [facility] and check in at the registration area.   Wear comfortable clothing and clothing appropriate for easy access to any Portacath or PICC line.   We strive to give you quality time with your provider. You may need to reschedule your appointment if you arrive late (15 or more minutes).  Arriving late affects you and other patients whose appointments are after yours.  Also, if you miss three or more appointments without notifying the office, you may be dismissed from the clinic at the providers discretion.      For prescription refill requests, have your pharmacy contact our office and allow 72 hours for refills to be completed.    Today you received the following chemotherapy and/or immunotherapy agents Doxorubicin, Vincristine, Cytoxan, Rituximab      To help prevent nausea and vomiting after your treatment, we encourage you to take your nausea medication as directed.  BELOW ARE SYMPTOMS THAT SHOULD BE REPORTED IMMEDIATELY: *FEVER GREATER THAN 100.4 F (38 C) OR HIGHER *CHILLS OR SWEATING *NAUSEA AND VOMITING THAT IS NOT CONTROLLED WITH YOUR NAUSEA MEDICATION *UNUSUAL SHORTNESS OF BREATH *UNUSUAL BRUISING OR BLEEDING *URINARY PROBLEMS (pain or burning when urinating, or frequent urination) *BOWEL PROBLEMS (unusual diarrhea, constipation, pain near the anus) TENDERNESS IN MOUTH AND THROAT WITH OR WITHOUT PRESENCE OF ULCERS (sore throat, sores in mouth, or a toothache) UNUSUAL RASH, SWELLING OR PAIN  UNUSUAL VAGINAL DISCHARGE OR ITCHING   Items with * indicate a potential emergency and should be followed up as soon as possible or go to the Emergency Department if any problems should occur.  Please show the CHEMOTHERAPY ALERT CARD or  IMMUNOTHERAPY ALERT CARD at check-in to the Emergency Department and triage nurse.  Should you have questions after your visit or need to cancel or reschedule your appointment, please contact [facility]  Dept: [phone]  and follow the prompts.  Office hours are 8:00 a.m. to 4:30 p.m. Monday - Friday. Please note that voicemails left after 4:00 p.m. may not be returned until the following business day.  We are closed weekends and major holidays. You have access to a nurse at all times for urgent questions. Please call the main number to the clinic Dept: [phone] and follow the prompts.   For any non-urgent questions, you may also contact your provider using MyChart. We now offer e-Visits for anyone 2 and older to request care online for non-urgent symptoms. For details visit [email or web address].   Also download the MyChart app! Go to the app store, search "MyChart", open the app, select [facility], and log in with your MyChart username and password.  Due to Covid, a mask is required upon entering the hospital/clinic. If you do not have a mask, one will be given to you upon arrival. For doctor visits, patients may have 1 support person aged 79 or older with them. For treatment visits, patients cannot have anyone with them due to current Covid guidelines and our immunocompromised population.   Doxorubicin injection What is this medication? DOXORUBICIN (dox oh ROO bi sin) is a chemotherapy drug. It is used to treat many kinds of cancer like leukemia, lymphoma, neuroblastoma, sarcoma, and Wilms' tumor. It is also used to treat bladder cancer, breast cancer, lung  cancer, ovarian cancer, stomach cancer, and thyroid cancer. This medicine may be used for other purposes; ask your health care provider or pharmacist if you have questions. COMMON BRAND NAME(S): Adriamycin, Adriamycin PFS, Adriamycin RDF, Rubex What should I tell my care team before I take this  medication? They need to know if you have any of these conditions: heart disease history of low blood counts caused by a medicine liver disease recent or ongoing radiation therapy an unusual or allergic reaction to doxorubicin, other chemotherapy agents, other medicines, foods, dyes, or preservatives pregnant or trying to get pregnant breast-feeding How should I use this medication? This drug is given as an infusion into a vein. It is administered in a hospital or clinic by a specially trained health care professional. If you have pain, swelling, burning or any unusual feeling around the site of your injection, tell your health care professional right away. Talk to your pediatrician regarding the use of this medicine in children. Special care may be needed. Overdosage: If you think you have taken too much of this medicine contact a poison control center or emergency room at once. NOTE: This medicine is only for you. Do not share this medicine with others. What if I miss a dose? It is important not to miss your dose. Call your doctor or health care professional if you are unable to keep an appointment. What may interact with this medication? This medicine may interact with the following medications: 6-mercaptopurine paclitaxel phenytoin St. John's Wort trastuzumab verapamil This list may not describe all possible interactions. Give your health care provider a list of all the medicines, herbs, non-prescription drugs, or dietary supplements you use. Also tell them if you smoke, drink alcohol, or use illegal drugs. Some items may interact with your medicine. What should I watch for while using this medication? This drug may make you feel generally unwell. This is not uncommon, as chemotherapy can affect healthy cells as well as cancer cells. Report any side effects. Continue your course of treatment even though you feel ill unless your doctor tells you to stop. There is a maximum amount of  this medicine you should receive throughout your life. The amount depends on the medical condition being treated and your overall health. Your doctor will watch how much of this medicine you receive in your lifetime. Tell your doctor if you have taken this medicine before. You may need blood work done while you are taking this medicine. Your urine may turn red for a few days after your dose. This is not blood. If your urine is dark or brown, call your doctor. In some cases, you may be given additional medicines to help with side effects. Follow all directions for their use. Call your doctor or health care professional for advice if you get a fever, chills or sore throat, or other symptoms of a cold or flu. Do not treat yourself. This drug decreases your body's ability to fight infections. Try to avoid being around people who are sick. This medicine may increase your risk to bruise or bleed. Call your doctor or health care professional if you notice any unusual bleeding. Talk to your doctor about your risk of cancer. You may be more at risk for certain types of cancers if you take this medicine. Do not become pregnant while taking this medicine or for 6 months after stopping it. Women should inform their doctor if they wish to become pregnant or think they might be pregnant. Men should not father  a child while taking this medicine and for 6 months after stopping it. There is a potential for serious side effects to an unborn child. Talk to your health care professional or pharmacist for more information. Do not breast-feed an infant while taking this medicine. This medicine has caused ovarian failure in some women and reduced sperm counts in some men This medicine may interfere with the ability to have a child. Talk with your doctor or health care professional if you are concerned about your fertility. This medicine may cause a decrease in Co-Enzyme Q-10. You should make sure that you get enough Co-Enzyme  Q-10 while you are taking this medicine. Discuss the foods you eat and the vitamins you take with your health care professional. What side effects may I notice from receiving this medication? Side effects that you should report to your doctor or health care professional as soon as possible: allergic reactions like skin rash, itching or hives, swelling of the face, lips, or tongue breathing problems chest pain fast or irregular heartbeat low blood counts - this medicine may decrease the number of Kenneth blood cells, red blood cells and platelets. You may be at increased risk for infections and bleeding. pain, redness, or irritation at site where injected signs of infection - fever or chills, cough, sore throat, pain or difficulty passing urine signs of decreased platelets or bleeding - bruising, pinpoint red spots on the skin, black, tarry stools, blood in the urine swelling of the ankles, feet, hands tiredness weakness Side effects that usually do not require medical attention (report to your doctor or health care professional if they continue or are bothersome): diarrhea hair loss mouth sores nail discoloration or damage nausea red colored urine vomiting This list may not describe all possible side effects. Call your doctor for medical advice about side effects. You may report side effects to FDA at 1-800-FDA-1088. Where should I keep my medication? This drug is given in a hospital or clinic and will not be stored at home. NOTE: This sheet is a summary. It may not cover all possible information. If you have questions about this medicine, talk to your doctor, pharmacist, or health care provider.  2022 Elsevier/Gold Standard (2017-02-25 00:00:00)  Vincristine injection What is this medication? VINCRISTINE (vin KRIS teen) is a chemotherapy drug. It slows the growth of cancer cells. This medicine is used to treat many types of cancer like Hodgkin's disease, leukemia, non-Hodgkin's  lymphoma, neuroblastoma (brain cancer), rhabdomyosarcoma, and Wilms' tumor. This medicine may be used for other purposes; ask your health care provider or pharmacist if you have questions. COMMON BRAND NAME(S): Oncovin, Vincasar PFS What should I tell my care team before I take this medication? They need to know if you have any of these conditions: blood disorders gout infection (especially chickenpox, cold sores, or herpes) kidney disease liver disease lung disease nervous system disease like Charcot-Marie-Tooth (CMT) recent or ongoing radiation therapy an unusual or allergic reaction to vincristine, other chemotherapy agents, other medicines, foods, dyes, or preservatives pregnant or trying to get pregnant breast-feeding How should I use this medication? This drug is given as an infusion into a vein. It is administered in a hospital or clinic by a specially trained health care professional. If you have pain, swelling, burning, or any unusual feeling around the site of your injection, tell your health care professional right away. Talk to your pediatrician regarding the use of this medicine in children. While this drug may be prescribed for selected conditions, precautions  do apply. Overdosage: If you think you have taken too much of this medicine contact a poison control center or emergency room at once. NOTE: This medicine is only for you. Do not share this medicine with others. What if I miss a dose? It is important not to miss your dose. Call your doctor or health care professional if you are unable to keep an appointment. What may interact with this medication? certain medicines for fungal infections like itraconazole, ketoconazole, posaconazole, voriconazole certain medicines for seizures like phenytoin This list may not describe all possible interactions. Give your health care provider a list of all the medicines, herbs, non-prescription drugs, or dietary supplements you use. Also  tell them if you smoke, drink alcohol, or use illegal drugs. Some items may interact with your medicine. What should I watch for while using this medication? This drug may make you feel generally unwell. This is not uncommon, as chemotherapy can affect healthy cells as well as cancer cells. Report any side effects. Continue your course of treatment even though you feel ill unless your doctor tells you to stop. You may need blood work done while you are taking this medicine. This medicine will cause constipation. Try to have a bowel movement at least every 2 to 3 days. If you do not have a bowel movement for 3 days, call your doctor or health care professional. In some cases, you may be given additional medicines to help with side effects. Follow all directions for their use. Do not become pregnant while taking this medicine. Women should inform their doctor if they wish to become pregnant or think they might be pregnant. There is a potential for serious side effects to an unborn child. Talk to your health care professional or pharmacist for more information. Do not breast-feed an infant while taking this medicine. This medicine may make it more difficult to get pregnant or to father a child. Talk to your healthcare professional if you are concerned about your fertility. What side effects may I notice from receiving this medication? Side effects that you should report to your doctor or health care professional as soon as possible: allergic reactions like skin rash, itching or hives, swelling of the face, lips, or tongue breathing problems confusion or changes in emotions or moods constipation cough mouth sores muscle weakness nausea and vomiting pain, swelling, redness or irritation at the injection site pain, tingling, numbness in the hands or feet problems with balance, talking, walking seizures stomach pain trouble passing urine or change in the amount of urine Side effects that usually do  not require medical attention (report to your doctor or health care professional if they continue or are bothersome): diarrhea hair loss jaw pain loss of appetite This list may not describe all possible side effects. Call your doctor for medical advice about side effects. You may report side effects to FDA at 1-800-FDA-1088. Where should I keep my medication? This drug is given in a hospital or clinic and will not be stored at home. NOTE: This sheet is a summary. It may not cover all possible information. If you have questions about this medicine, talk to your doctor, pharmacist, or health care provider.  2022 Elsevier/Gold Standard (2021-03-11 00:00:00)  Cyclophosphamide Injection What is this medication? CYCLOPHOSPHAMIDE (sye kloe FOSS fa mide) is a chemotherapy drug. It slows the growth of cancer cells. This medicine is used to treat many types of cancer like lymphoma, myeloma, leukemia, breast cancer, and ovarian cancer, to name a few. This  medicine may be used for other purposes; ask your health care provider or pharmacist if you have questions. COMMON BRAND NAME(S): Cytoxan, Neosar What should I tell my care team before I take this medication? They need to know if you have any of these conditions: heart disease history of irregular heartbeat infection kidney disease liver disease low blood counts, like Kenneth cells, platelets, or red blood cells on hemodialysis recent or ongoing radiation therapy scarring or thickening of the lungs trouble passing urine an unusual or allergic reaction to cyclophosphamide, other medicines, foods, dyes, or preservatives pregnant or trying to get pregnant breast-feeding How should I use this medication? This drug is usually given as an injection into a vein or muscle or by infusion into a vein. It is administered in a hospital or clinic by a specially trained health care professional. Talk to your pediatrician regarding the use of this medicine in  children. Special care may be needed. Overdosage: If you think you have taken too much of this medicine contact a poison control center or emergency room at once. NOTE: This medicine is only for you. Do not share this medicine with others. What if I miss a dose? It is important not to miss your dose. Call your doctor or health care professional if you are unable to keep an appointment. What may interact with this medication? amphotericin B azathioprine certain antivirals for HIV or hepatitis certain medicines for blood pressure, heart disease, irregular heart beat certain medicines that treat or prevent blood clots like warfarin certain other medicines for cancer cyclosporine etanercept indomethacin medicines that relax muscles for surgery medicines to increase blood counts metronidazole This list may not describe all possible interactions. Give your health care provider a list of all the medicines, herbs, non-prescription drugs, or dietary supplements you use. Also tell them if you smoke, drink alcohol, or use illegal drugs. Some items may interact with your medicine. What should I watch for while using this medication? Your condition will be monitored carefully while you are receiving this medicine. You may need blood work done while you are taking this medicine. Drink water or other fluids as directed. Urinate often, even at night. Some products may contain alcohol. Ask your health care professional if this medicine contains alcohol. Be sure to tell all health care professionals you are taking this medicine. Certain medicines, like metronidazole and disulfiram, can cause an unpleasant reaction when taken with alcohol. The reaction includes flushing, headache, nausea, vomiting, sweating, and increased thirst. The reaction can last from 30 minutes to several hours. Do not become pregnant while taking this medicine or for 1 year after stopping it. Women should inform their health care  professional if they wish to become pregnant or think they might be pregnant. Men should not father a child while taking this medicine and for 4 months after stopping it. There is potential for serious side effects to an unborn child. Talk to your health care professional for more information. Do not breast-feed an infant while taking this medicine or for 1 week after stopping it. This medicine has caused ovarian failure in some women. This medicine may make it more difficult to get pregnant. Talk to your health care professional if you are concerned about your fertility. This medicine has caused decreased sperm counts in some men. This may make it more difficult to father a child. Talk to your health care professional if you are concerned about your fertility. Call your health care professional for advice if you get  a fever, chills, or sore throat, or other symptoms of a cold or flu. Do not treat yourself. This medicine decreases your body's ability to fight infections. Try to avoid being around people who are sick. Avoid taking medicines that contain aspirin, acetaminophen, ibuprofen, naproxen, or ketoprofen unless instructed by your health care professional. These medicines may hide a fever. Talk to your health care professional about your risk of cancer. You may be more at risk for certain types of cancer if you take this medicine. If you are going to need surgery or other procedure, tell your health care professional that you are using this medicine. Be careful brushing or flossing your teeth or using a toothpick because you may get an infection or bleed more easily. If you have any dental work done, tell your dentist you are receiving this medicine. What side effects may I notice from receiving this medication? Side effects that you should report to your doctor or health care professional as soon as possible: allergic reactions like skin rash, itching or hives, swelling of the face, lips, or  tongue breathing problems nausea, vomiting signs and symptoms of bleeding such as bloody or black, tarry stools; red or dark brown urine; spitting up blood or brown material that looks like coffee grounds; red spots on the skin; unusual bruising or bleeding from the eyes, gums, or nose signs and symptoms of heart failure like fast, irregular heartbeat, sudden weight gain; swelling of the ankles, feet, hands signs and symptoms of infection like fever; chills; cough; sore throat; pain or trouble passing urine signs and symptoms of kidney injury like trouble passing urine or change in the amount of urine signs and symptoms of liver injury like dark yellow or brown urine; general ill feeling or flu-like symptoms; light-colored stools; loss of appetite; nausea; right upper belly pain; unusually weak or tired; yellowing of the eyes or skin Side effects that usually do not require medical attention (report to your doctor or health care professional if they continue or are bothersome): confusion decreased hearing diarrhea facial flushing hair loss headache loss of appetite missed menstrual periods signs and symptoms of low red blood cells or anemia such as unusually weak or tired; feeling faint or lightheaded; falls skin discoloration This list may not describe all possible side effects. Call your doctor for medical advice about side effects. You may report side effects to FDA at 1-800-FDA-1088. Where should I keep my medication? This drug is given in a hospital or clinic and will not be stored at home. NOTE: This sheet is a summary. It may not cover all possible information. If you have questions about this medicine, talk to your doctor, pharmacist, or health care provider.  2022 Elsevier/Gold Standard (2021-03-11 00:00:00)  Rituximab Injection What is this medication? RITUXIMAB (ri TUX i mab) is a monoclonal antibody. It is used to treat certain types of cancer like non-Hodgkin lymphoma and  chronic lymphocytic leukemia. It is also used to treat rheumatoid arthritis, granulomatosis with polyangiitis, microscopic polyangiitis, and pemphigus vulgaris. This medicine may be used for other purposes; ask your health care provider or pharmacist if you have questions. COMMON BRAND NAME(S): RIABNI, Rituxan, RUXIENCE What should I tell my care team before I take this medication? They need to know if you have any of these conditions: chest pain heart disease infection especially a viral infection such as chickenpox, cold sores, hepatitis B, or herpes immune system problems irregular heartbeat or rhythm kidney disease low blood counts (Kenneth cells, platelets, or  red cells) lung disease recent or upcoming vaccine an unusual or allergic reaction to rituximab, other medicines, foods, dyes, or preservatives pregnant or trying to get pregnant breast-feeding How should I use this medication? This medicine is injected into a vein. It is given by a health care provider in a hospital or clinic setting. A special MedGuide will be given to you before each treatment. Be sure to read this information carefully each time. Talk to your health care provider about the use of this medicine in children. While this drug may be prescribed for children as Kubly as 6 months for selected conditions, precautions do apply. Overdosage: If you think you have taken too much of this medicine contact a poison control center or emergency room at once. NOTE: This medicine is only for you. Do not share this medicine with others. What if I miss a dose? Keep appointments for follow-up doses. It is important not to miss your dose. Call your health care provider if you are unable to keep an appointment. What may interact with this medication? Do not take this medicine with any of the following medicines: live vaccines This medicine may also interact with the following medicines: cisplatin This list may not describe all  possible interactions. Give your health care provider a list of all the medicines, herbs, non-prescription drugs, or dietary supplements you use. Also tell them if you smoke, drink alcohol, or use illegal drugs. Some items may interact with your medicine. What should I watch for while using this medication? Your condition will be monitored carefully while you are receiving this medicine. You may need blood work done while you are taking this medicine. This medicine can cause serious infusion reactions. To reduce the risk your health care provider may give you other medicines to take before receiving this one. Be sure to follow the directions from your health care provider. This medicine may increase your risk of getting an infection. Call your health care provider for advice if you get a fever, chills, sore throat, or other symptoms of a cold or flu. Do not treat yourself. Try to avoid being around people who are sick. Call your health care provider if you are around anyone with measles, chickenpox, or if you develop sores or blisters that do not heal properly. Avoid taking medicines that contain aspirin, acetaminophen, ibuprofen, naproxen, or ketoprofen unless instructed by your health care provider. These medicines may hide a fever. This medicine may cause serious skin reactions. They can happen weeks to months after starting the medicine. Contact your health care provider right away if you notice fevers or flu-like symptoms with a rash. The rash may be red or purple and then turn into blisters or peeling of the skin. Or, you might notice a red rash with swelling of the face, lips or lymph nodes in your neck or under your arms. In some patients, this medicine may cause a serious brain infection that may cause death. If you have any problems seeing, thinking, speaking, walking, or standing, tell your healthcare professional right away. If you cannot reach your healthcare professional, urgently seek other  source of medical care. Do not become pregnant while taking this medicine or for at least 12 months after stopping it. Women should inform their health care provider if they wish to become pregnant or think they might be pregnant. There is potential for serious harm to an unborn child. Talk to your health care provider for more information. Women should use a reliable form of  birth control while taking this medicine and for 12 months after stopping it. Do not breast-feed while taking this medicine or for at least 6 months after stopping it. What side effects may I notice from receiving this medication? Side effects that you should report to your health care provider as soon as possible: allergic reactions (skin rash, itching or hives; swelling of the face, lips, or tongue) diarrhea edema (sudden weight gain; swelling of the ankles, feet, hands or other unusual swelling; trouble breathing) fast, irregular heartbeat heart attack (trouble breathing; pain or tightness in the chest, neck, back or arms; unusually weak or tired) infection (fever, chills, cough, sore throat, pain or trouble passing urine) kidney injury (trouble passing urine or change in the amount of urine) liver injury (dark yellow or brown urine; general ill feeling or flu-like symptoms; loss of appetite, right upper belly pain; unusually weak or tired, yellowing of the eyes or skin) low blood pressure (dizziness; feeling faint or lightheaded, falls; unusually weak or tired) low red blood cell counts (trouble breathing; feeling faint; lightheaded, falls; unusually weak or tired) mouth sores redness, blistering, peeling, or loosening of the skin, including inside the mouth stomach pain unusual bruising or bleeding wheezing (trouble breathing with loud or whistling sounds) vomiting Side effects that usually do not require medical attention (report to your health care provider if they continue or are bothersome): headache joint  pain muscle cramps, pain nausea This list may not describe all possible side effects. Call your doctor for medical advice about side effects. You may report side effects to FDA at 1-800-FDA-1088. Where should I keep my medication? This medicine is given in a hospital or clinic. It will not be stored at home. NOTE: This sheet is a summary. It may not cover all possible information. If you have questions about this medicine, talk to your doctor, pharmacist, or health care provider.  2022 Elsevier/Gold Standard (2020-06-24 00:00:00)

## 2021-09-11 NOTE — Progress Notes (Signed)
Patient presents for treatment. RN assessment completed along with the following: ? ?Labs/vitals reviewed - Yes, and within treatment parameters.   ?Weight within 10% of previous measurement - Yes ?Informed consent completed and reflects current therapy/intent - Yes, on date 07/10/21             ?Provider progress note reviewed - Today's provider note is not yet available. I reviewed the most recent oncology provider progress note in chart dated 08/21/21. ?Treatment/Antibody/Supportive plan reviewed - Yes, and there are no adjustments needed for today's treatment. ?S&H and other orders reviewed - Yes, and there are no additional orders identified. ?Previous treatment date reviewed - Yes, and the appropriate amount of time has elapsed between treatments. ?Clinic Hand Off Received from - Cristy Friedlander, RN ? ?Patient to proceed with treatment.  ? ?

## 2021-09-11 NOTE — Progress Notes (Signed)
Patient seen by Dr. [name] today ? ?Vitals are within treatment parameters. ? ?Labs reviewed by Dr. [name] and are within treatment parameters. ? ?Per physician team, patient is ready for treatment and there are NO modifications to the treatment plan.  ?

## 2021-09-11 NOTE — Progress Notes (Signed)
Discussed increased risk of extravisation when administering vesicants through PIV.  Patient verbalized understanding and agreed to proceed with treatment.  Instructed patient to alert RN if he started to feel increased coolness, burning, stinging at site of PIV.  Patient verbalized understanding.  ?Blood return was checked and confirmed before administration, during administration, and after administration of vesicants Doxorubicin and Vincristine. ?

## 2021-09-11 NOTE — Progress Notes (Signed)
?Kenneth [name] ?OFFICE PROGRESS NOTE ? ? ?Diagnosis: Non-Hodgkin's lymphoma ? ?INTERVAL HISTORY:  ? ?Kenneth [name] returns as scheduled.  He completed cycle 3 CHOP-rituximab 08/21/2021.  No mouth sores or neuropathy symptoms.  No palpable lymph nodes.  No change in exertional dyspnea. ? ?Objective: ? ?Vital signs in last 24 hours: ? ?Blood pressure 120/70, pulse 91, temperature 98.1 ?F (36.7 ?C), temperature source Oral, resp. rate 18, height _0  (1.753 m), weight 172 lb 3.2 oz (78.1 kg), SpO2 96 %. ?  ? ?HEENT: No thrush or ulcers ?Lymphatics: No cervical, supraclavicular, axillary, or inguinal nodes ?Resp: Inspiratory rales at the lower posterior chest bilaterally, no respiratory distress ?Cardio: Regular rate and rhythm ?GI: No hepatosplenomegaly ?Vascular: No leg edema ? ? ?Lab Results  ?Component Value Date  ? WBC 3.0 (L) 09/11/2021  ? HGB 11.3 (L) 09/11/2021  ? HCT 31.4 (L) 09/11/2021  ? MCV 89.7 09/11/2021  ? PLT 205 09/11/2021  ? NEUTROABS 1.7 09/11/2021  ? ? ?CMP  ?Lab Results  ?Component Value Date  ? NA 134 (L) 09/11/2021  ? K 4.0 09/11/2021  ? CL 100 09/11/2021  ? CO2 26 09/11/2021  ? GLUCOSE 131 (H) 09/11/2021  ? BUN 12 09/11/2021  ? CREATININE 0.66 09/11/2021  ? CALCIUM 9.3 09/11/2021  ? PROT 6.7 09/11/2021  ? ALBUMIN 4.1 09/11/2021  ? AST 20 09/11/2021  ? ALT 14 09/11/2021  ? ALKPHOS 42 09/11/2021  ? BILITOT 0.4 09/11/2021  ? GFRNONAA >60 09/11/2021  ? GFRAA >60 06/01/2017  ? ? ?Medications: I have reviewed the patient's current medications. ? ? ?Assessment/Plan: ?Non-Hodgkin's lymphoma ?CTs neck and chest 03/19/2021-cluster of left supraclavicular nodes measuring less than 1 cm, 12 mm right node next to the jugular vein, chest with slight interval progression of pulmonary fibrosis, new small sclerotic lesion in the right third rib concerning for metastasis, lucent lesion in T8 corresponding to abnormal uptake on a bone scan 05/21/2020 suspicious for malignancy, subpectoral  nodes-subcentimeter in size but larger than 03/28/2019 ?FNA of left neck node 03/25/2021-atypical lymphoid proliferation ?Excisional biopsy of level 5 left neck node 04/07/2021-lymph node with marked autolysis and diffusely infiltrated by atypical lymphocytes, 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 ? ? ? ?Disposition: ?Kenneth [name] has completed 3 cycles of R-CHOP chemotherapy for treatment of non-Hodgkin's and Phoma.  The palpable lymph nodes  have  resolved.  He has tolerated the chemotherapy well to date.  He will complete cycle 4 chemotherapy today.  He will undergo a restaging PET scan after this cycle.  The neutrophil count is borderline low today, but similar to when he has received treatment in the past.  He will call for symptoms of an infection. ? ?He will return for an office visit in 3 weeks. ? ?[name], MD ? ?09/11/2021  ?8:57 AM ? ? ?

## 2021-09-18 ENCOUNTER — Encounter (HOSPITAL_COMMUNITY): Payer: PPO

## 2021-09-18 ENCOUNTER — Ambulatory Visit: Payer: PPO | Admitting: Internal Medicine

## 2021-09-18 ENCOUNTER — Encounter: Payer: Self-pay | Admitting: *Deleted

## 2021-09-18 ENCOUNTER — Encounter: Payer: Self-pay | Admitting: Internal Medicine

## 2021-09-18 ENCOUNTER — Ambulatory Visit (INDEPENDENT_AMBULATORY_CARE_PROVIDER_SITE_OTHER): Payer: PPO | Admitting: Internal Medicine

## 2021-09-18 ENCOUNTER — Telehealth: Payer: Self-pay | Admitting: *Deleted

## 2021-09-18 ENCOUNTER — Other Ambulatory Visit: Payer: Self-pay

## 2021-09-18 VITALS — BP 130/68 | HR 78 | Ht 69.0 in | Wt 173.2 lb

## 2021-09-18 DIAGNOSIS — Z5181 Encounter for therapeutic drug level monitoring: Secondary | ICD-10-CM | POA: Diagnosis not present

## 2021-09-18 DIAGNOSIS — J84112 Idiopathic pulmonary fibrosis: Secondary | ICD-10-CM

## 2021-09-18 DIAGNOSIS — R110 Nausea: Secondary | ICD-10-CM

## 2021-09-18 LAB — PULMONARY FUNCTION TEST
DL/VA % pred: 105 %
DL/VA: 4.12 ml/min/mmHg/L
DLCO cor % pred: 61 %
DLCO cor: 14.64 ml/min/mmHg
DLCO unc % pred: 54 %
DLCO unc: 13.07 ml/min/mmHg
FEF 25-75 Pre: 2.75 L/sec
FEF2575-%Pred-Pre: 142 %
FEV1-%Pred-Pre: 80 %
FEV1-Pre: 2.25 L
FEV1FVC-%Pred-Pre: 116 %
FEV6-%Pred-Pre: 73 %
FEV6-Pre: 2.68 L
FEV6FVC-%Pred-Pre: 107 %
FVC-%Pred-Pre: 68 %
FVC-Pre: 2.68 L
Pre FEV1/FVC ratio: 84 %
Pre FEV6/FVC Ratio: 100 %

## 2021-09-18 NOTE — Patient Instructions (Addendum)
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[place]) ?Encounter for therapeutic drug monitoring ?Research subject - IPF PRO registry ?Nausea due to drug  ? ?- somewhat progressive since sept 2020  but on symptom score and walking desaturation test it appears you are stable 2021 -> 2023 ? ? ?- Nausea worse with generic pirfendione and with larger tablet  ? ?-. Weight is improved and good news ? ?- LFT normal March 2023 ?- last CT chest sept 2022 ? ?Plan ?- -Keep pulse ox greater than 88% at all times ?- Continue pirfenidone /  ESBRIET samples  ? - take zofran 30-60 min before anti-fibrotic ?- Do not qualify for new interventional clinical trials given recent history of prostate cancer and now lymphoma ?-Research registry visit  in 6 months ?-On any trips to mountains monitor oxygen very closely and ensure it is over 88% at all times ?-avoid respiratory viruses via mask, avoiding clusters and sick contacts and crowding ? - but ok to take some risk such as golf, and masking at restaurant or church/socials ? ?- spirometry/dlco in 4 months ? ?- can hold off [employer] ? ? ?Enlarged pulmonary artery (HCC) 0 seen on CT scan September 2022 ? ?-No evidence of pulmonary hypertension on echocardiogram in 2022 ? ?Plan ?- Clinically monitor ? ? ?Chronic fatigue ?Irritable bowel syndrome, unspecified type ? ?-This appears multifactorial and related to prostate cancer treatment, IPF, pirfenidone and also recent radiation for the prostate and possibly lymphoma ? ?- currently stable ? ?Plan ? - Continue judicious exercises ? ? ? ?Cervical lymphadenopathy - new. B cell lymphoma ?Prostate cancer ? ? - on Treatment ? ?Plan ?- per Dr [name] ? ?Follow-up ?- 30-minute face-to-face visit in4 months  with Dr. [name] or sooner if needed ? -ILD symptom score and walking desaturation test at follow-up ?  ?

## 2021-09-18 NOTE — Progress Notes (Signed)
Spoke with Dr Annamaria Boots, regarding difficulty gripping things like glasses or other things. He doesn't describe any numbness or tingling in hands, states that he has that in his feet from prior back surgery.  He does not describe cramping either.   ?He does prescribe to having difficulty with fine motor skills like buttoning up shirts. ? ?Discussed that this could be related to chemotherapy. ? ?He is meeting with physical therapist tomorrow and will inquire about exercises to help. Will inform Dr [name].  ? ?Encouraged to call back with any other issues or questions. And he returns to clinic on 3/30.  ? ? ?

## 2021-09-18 NOTE — Progress Notes (Signed)
Spirometry and Dlco done today. ?

## 2021-09-18 NOTE — Progress Notes (Signed)
? ? ? ? ? ?V 03/04/2017 - new consult ? 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 He tells me that this summer 2018*noticing insidious onset of shortness of breath particularly with walking stairs in the house or going [place]. This was not that in the previous years. The summer 2018 he was in [place] but did not notice much shortness of breath. Otherwise he is able to do his activities of daily living and played golf with a car. He is more bothered by his back pain and sciatica as a result of previous back surgery. Shortness of breath is extremely mild. He says he became more aware of it after the radiologic investigations documented below. He had a chest x-ray that suggested interstitial findings and therefore he underwent a high-resolution CT scan of the chest that is described below. I personally visualized this high resolution CT chest and to me it shows bilateral bibasal subpleural reticulation that also extends to the upper lobes without any zonal predominance. There is no obvious honeycombing but there is traction bronchiectasis. There no mediastinal adenopathy. Therefore he is been referred here. In terms of exposure history other than radiation exposure he has not been on any pulmonary toxic drugs or any mold exposure or asbestos exposure. Does have GERD and is on PPI and is well ocntrolled ? ? ?Does have spring and perennial allergies - sneezes easy. Allergy test with [facility] negative some years ago ? ?[employer] chest physicians interstitial lung disease questionnaire: He says that he does not cough. He is only trouble with dyspnea with strenuous exercise and it started 4 months ago. Past medical history significant for acid reflux. He has never smoked any tobacco or street drugs. Does not have any family history of lung  disease. At home he does not have any humidifier sound birds heart the water damage or mold. 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 He has standard radiation exposure while at work. The standard radiation for a radiologist. He does not take any pulmonary toxic drugs. House is 80 years old and he worries about mold. He sneezes if air is forced ? ? ?Pulmonary function test shows isolated reduction in diffusion capacity to 62%. Walking desaturation test underneath her feet ?3 laps on room air: Resting pulse ox 96%. Final pulse ox 95%. Heart rate resting was 92/m and went up to 109 minute. ? ? ?Results for OREY, MOURE (MRN [id number]) as of 03/23/2017 10:04 ? Ref. Range 03/04/2017 09:13  ?FVC-Pre Latest Units: L 3.59  ?FVC-%Pred-Pre Latest Units: % 86  ?Results for DAMEL, QUERRY (MRN [id number]) as of 03/23/2017 10:04 ? Ref. Range 03/04/2017 09:13  ?DLCO cor Latest Units: ml/min/mmHg 20.06  ?DLCO cor % pred Latest Units: % 63  ? ? ?Lungs/Pleura: There is subpleural reticulation with scattered ?ground-glass and traction bronchiolectasis, without a definite zonal ?predominance. No definitive honeycombing. Findings appear mildly ?progressive when radiographs dating back to 12/28/2008 are reviewed. ?Probable 5 mm subpleural lymph node along the right major fissure. ?No pleural fluid. Airway is unremarkable. ? ? ? ? ? ?IMPRESSION: ?1. Pulmonary parenchymal findings of interstitial lung disease which ?may be due to fibrotic nonspecific interstitial pneumonitis or, ?given slight progression over time, usual interstitial pneumonitis. ?2. Aortic atherosclerosis (ICD10-170.0). Coronary artery ?calcification. ?3. 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 Recommend annual ?imaging followup by CTA or MRA. This recommendation follows 2010 ?ACCF/AHA/AATS/ACR/ASA/SCA/SCAI/SIR/STS/SVM Guidelines for the ?Diagnosis and Management of Patients with  Thoracic Aortic Disease. ?Circulation. 2010; 121: S341-D622. ?4. Prominence of the right and left main pulmonary arteries can be ?seen with pulmonary arterial hypertension. ?  ?  ?Electronically Signed ?  By: [name] ?  On: 02/17/2017 13:41 ?   ? ? ?OV 03/23/2017 ? ?Chief Complaint  ?Patient presents with  ? Follow-up  ?  SOB on exertion. Other than that he has been doing about the same. Denies any cough or CP.  ? ?Here to discuss results. [name]. 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 In inteirm no new symptoms.  Autoimmune profile negative. They raise possibility of 2nd opinion.  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 Progression is based on CXR comparison since 2010; there is no CT and is only suspicion of progression ? ?Results for NICKLAUS, ALVIAR (MRN [id number]) as of 03/23/2017 10:04 ? Ref. Range 12/08/2011 16:44 03/04/2017 13:47  ?Anit Nuclear Antibody(ANA) Latest Ref Range: NEGATIVE   NEG  ?Angiotensin-Converting Enzyme Latest Ref Range: 9 - 67 U/L  31  ?Cyclic Citrullin Peptide Ab Latest Units: Units  <16  ?ds DNA Ab Latest Units: IU/mL  <1  ?ENA RNP Ab Latest Ref Range: 0.0 - 0.9 AI  0.2  ?Endomysial Screen Latest Ref Range: NEGATIVE  NEGATIVE   ?Deamidated Gliadin Abs, IgG Latest Ref Range: <20 U/mL 4.1   ?Gliadin IgA Latest Ref Range: <20 U/mL 1.8   ?RA Latex Turbid. Latest Ref Range: <14 IU/mL  <14  ?Tissue Transglut Ab Latest Ref Range: <20 U/mL 3.9   ?Tissue Transglutaminase Ab, IgA Latest Ref Range: <20 U/mL 2.2   ?IgE (Immunoglobulin E), Serum Latest Ref Range: <115 kU/L  31  ?IgA Latest Ref Range: 68 - 379 mg/dL 132   ?SSA (Ro) (ENA) Antibody, IgG Latest Ref Range: <1.0 NEG AI   <1.0 NEG  ?SSB (La) (ENA) Antibody, IgG Latest Ref Range: <1.0 NEG AI   <1.0 NEG  ? ? ?OV 05/06/2017 ? ?Chief Complaint  ?Patient presents with  ? Follow-up  ?  Follow up today after beginning [place]. Pt has no  c/o SOB, cough, or CP. Has had some mild nausea from the [place] but other than that, he has been doing good on it.  ? ?ollow-up idiopathic pulmonary fibrosis mild severity ? ?He is here to follow-up He is on the third week of his bed at 3 pills 3 times a day max dose. So far is tolerating it fine other than mild nausea. He does take after meals. He plans to meet with [facility] coordinator for medication support. He did visit [place] this past weekend and did have vomiting especially after consuming few etoh drinks at a [employer]. He does apply sunscreen [his daughter is a Paediatric nurse in [place]. He still complains about the fact that and is 80 year old home when the air conditioner comes on and they air blows out of the duct system he does sneeze 10 times and his feet does feel cold. She plans to put hepa filters that are high and and also get the duct cleaning. He says he did have allergy evaluation and found few years ago and it was negative. Recently he has increased his rpinrole and  alsogot an injection for his back and he does feel better ? ? ?OV 06/22/2017 ? ?Chief Complaint  ?Patient presents with  ? Follow-up  ?  Pt still taking Esbriet and has been doing good except has been having issues with nausea x3 days. PFT done today. Still becomes SOB with exertion. Denies any complaints of cough or CP.  ? ?IPF followup ? ?Routine followup. Now on esbriet 3 pills tid since end oct 2018. Having new nausea - moderate, x 3 days. Might be chills. But no associated diarrhea or other side effects. Spacing esbriet 4h only. Also has complaitns about high co pay. Unable to find charity. Doing exrcise with trainer - feeels that is better and more vigorous than rehab. Gets tachy with eercise but says he does not desaturate. Had PFT - stable. Lot of questions about disease ? ? ? ?Walking desaturation test on 06/22/2017 185 feet x 3 laps on ROOM AIR:  did NOT desaturate. Rest pulse ox was 100%, final pulse ox was 99%.  HR response 84/min at rest to 97/min at peak exertion. Patient SENICA CRALL  Did not Desaturate < 88% . Westley Chandler did not  Desaturated </= 3% points. Deshay A Swett yes did get tachyardic ? ? ?OV 09/20/2017 ? ?

## 2021-09-19 DIAGNOSIS — M4726 Other spondylosis with radiculopathy, lumbar region: Secondary | ICD-10-CM | POA: Diagnosis not present

## 2021-09-28 ENCOUNTER — Other Ambulatory Visit: Payer: Self-pay | Admitting: Oncology

## 2021-09-29 ENCOUNTER — Encounter (HOSPITAL_COMMUNITY)
Admission: RE | Admit: 2021-09-29 | Discharge: 2021-09-29 | Disposition: A | Discharge status: Home / Self Care | Payer: PPO | Source: Ambulatory Visit | Attending: Oncology | Admitting: Oncology

## 2021-09-29 ENCOUNTER — Other Ambulatory Visit: Payer: Self-pay

## 2021-09-29 DIAGNOSIS — K6389 Other specified diseases of intestine: Secondary | ICD-10-CM | POA: Diagnosis not present

## 2021-09-29 DIAGNOSIS — C8330 Diffuse large B-cell lymphoma, unspecified site: Secondary | ICD-10-CM | POA: Insufficient documentation

## 2021-09-29 DIAGNOSIS — K8020 Calculus of gallbladder without cholecystitis without obstruction: Secondary | ICD-10-CM | POA: Diagnosis not present

## 2021-09-29 DIAGNOSIS — I2510 Atherosclerotic heart disease of native coronary artery without angina pectoris: Secondary | ICD-10-CM | POA: Diagnosis not present

## 2021-09-29 DIAGNOSIS — C8590 Non-Hodgkin lymphoma, unspecified, unspecified site: Secondary | ICD-10-CM | POA: Diagnosis not present

## 2021-09-29 LAB — GLUCOSE, CAPILLARY: Glucose-Capillary: 119 mg/dL — ABNORMAL HIGH (ref 70–99)

## 2021-09-29 MED ORDER — FLUDEOXYGLUCOSE F - 18 (FDG) INJECTION
7.0000 | Freq: Once | INTRAVENOUS | Status: AC | PRN
  Administered 2021-09-29: 8.15 via INTRAVENOUS

## 2021-09-30 ENCOUNTER — Encounter: Payer: Self-pay | Admitting: *Deleted

## 2021-10-02 ENCOUNTER — Inpatient Hospital Stay: Payer: PPO

## 2021-10-02 ENCOUNTER — Encounter: Payer: Self-pay | Admitting: *Deleted

## 2021-10-02 ENCOUNTER — Other Ambulatory Visit: Payer: Self-pay

## 2021-10-02 ENCOUNTER — Inpatient Hospital Stay: Payer: PPO | Admitting: Oncology

## 2021-10-02 ENCOUNTER — Other Ambulatory Visit: Payer: Self-pay | Admitting: *Deleted

## 2021-10-02 VITALS — BP 133/81 | HR 98 | Temp 97.9°F | Resp 18 | Ht 69.0 in | Wt 174.2 lb

## 2021-10-02 VITALS — BP 159/77 | HR 83 | Temp 97.8°F | Resp 18

## 2021-10-02 DIAGNOSIS — C8330 Diffuse large B-cell lymphoma, unspecified site: Secondary | ICD-10-CM | POA: Diagnosis not present

## 2021-10-02 DIAGNOSIS — Z5112 Encounter for antineoplastic immunotherapy: Secondary | ICD-10-CM | POA: Diagnosis not present

## 2021-10-02 LAB — CMP (CANCER CENTER ONLY)
ALT: 15 U/L (ref 0–44)
AST: 22 U/L (ref 15–41)
Albumin: 4.1 g/dL (ref 3.5–5.0)
Alkaline Phosphatase: 39 U/L (ref 38–126)
Anion gap: 10 (ref 5–15)
BUN: 9 mg/dL (ref 8–23)
CO2: 24 mmol/L (ref 22–32)
Calcium: 9.3 mg/dL (ref 8.9–10.3)
Chloride: 97 mmol/L — ABNORMAL LOW (ref 98–111)
Creatinine: 0.65 mg/dL (ref 0.61–1.24)
GFR, Estimated: >60 mL/min (ref >60)
Glucose, Bld: 145 mg/dL — ABNORMAL HIGH (ref 70–99)
Potassium: 4.1 mmol/L (ref 3.5–5.1)
Sodium: 131 mmol/L — ABNORMAL LOW (ref 135–145)
Total Bilirubin: 0.4 mg/dL (ref 0.3–1.2)
Total Protein: 6.7 g/dL (ref 6.5–8.1)

## 2021-10-02 LAB — CBC WITH DIFFERENTIAL (CANCER CENTER ONLY)
Abs Immature Granulocytes: 0.03 10*3/uL (ref 0.00–0.07)
Basophils Absolute: 0 10*3/uL (ref 0.0–0.1)
Basophils Relative: 0 %
Eosinophils Absolute: 0.2 10*3/uL (ref 0.0–0.5)
Eosinophils Relative: 7 %
HCT: 31.9 % — ABNORMAL LOW (ref 39.0–52.0)
Hemoglobin: 11.1 g/dL — ABNORMAL LOW (ref 13.0–17.0)
Immature Granulocytes: 1 %
Lymphocytes Relative: 12 %
Lymphs Abs: 0.4 10*3/uL — ABNORMAL LOW (ref 0.7–4.0)
MCH: 31.7 pg (ref 26.0–34.0)
MCHC: 34.8 g/dL (ref 30.0–36.0)
MCV: 91.1 fL (ref 80.0–100.0)
Monocytes Absolute: 0.5 10*3/uL (ref 0.1–1.0)
Monocytes Relative: 16 %
Neutro Abs: 2 10*3/uL (ref 1.7–7.7)
Neutrophils Relative %: 64 %
Platelet Count: 218 10*3/uL (ref 150–400)
RBC: 3.5 MIL/uL — ABNORMAL LOW (ref 4.22–5.81)
RDW: 13.9 % (ref 11.5–15.5)
WBC Count: 3.1 10*3/uL — ABNORMAL LOW (ref 4.0–10.5)
nRBC: 0 % (ref 0.0–0.2)

## 2021-10-02 LAB — LACTATE DEHYDROGENASE: LDH: 234 U/L — ABNORMAL HIGH (ref 98–192)

## 2021-10-02 MED ORDER — SODIUM CHLORIDE 0.9 % IV SOLN
10.0000 mg | Freq: Once | INTRAVENOUS | Status: AC
  Administered 2021-10-02: 10 mg via INTRAVENOUS
  Filled 2021-10-02: qty 1

## 2021-10-02 MED ORDER — ACETAMINOPHEN 325 MG PO TABS
650.0000 mg | ORAL_TABLET | Freq: Once | ORAL | Status: AC
  Administered 2021-10-02: 650 mg via ORAL
  Filled 2021-10-02: qty 2

## 2021-10-02 MED ORDER — PREDNISONE 20 MG PO TABS: 60.0000 mg | ORAL_TABLET | Freq: Every day | ORAL | 2 refills | Status: DC

## 2021-10-02 MED ORDER — SODIUM CHLORIDE 0.9 % IV SOLN
150.0000 mg | Freq: Once | INTRAVENOUS | Status: AC
  Administered 2021-10-02: 150 mg via INTRAVENOUS
  Filled 2021-10-02: qty 5

## 2021-10-02 MED ORDER — ONDANSETRON HCL 8 MG PO TABS: 8.0000 mg | ORAL_TABLET | Freq: Three times a day (TID) | ORAL | 1 refills | Status: DC | PRN

## 2021-10-02 MED ORDER — SODIUM CHLORIDE 0.9 % IV SOLN
375.0000 mg/m2 | Freq: Once | INTRAVENOUS | Status: AC
  Administered 2021-10-02: 700 mg via INTRAVENOUS
  Filled 2021-10-02: qty 20

## 2021-10-02 MED ORDER — DIPHENHYDRAMINE HCL 25 MG PO CAPS
50.0000 mg | ORAL_CAPSULE | Freq: Once | ORAL | Status: AC
  Administered 2021-10-02: 50 mg via ORAL
  Filled 2021-10-02: qty 2

## 2021-10-02 MED ORDER — DOXORUBICIN HCL CHEMO IV INJECTION 2 MG/ML
25.0000 mg/m2 | Freq: Once | INTRAVENOUS | Status: AC
  Administered 2021-10-02: 48 mg via INTRAVENOUS
  Filled 2021-10-02: qty 24

## 2021-10-02 MED ORDER — VINCRISTINE SULFATE CHEMO INJECTION 1 MG/ML
1.0000 mg | Freq: Once | INTRAVENOUS | Status: AC
  Administered 2021-10-02: 1 mg via INTRAVENOUS
  Filled 2021-10-02: qty 1

## 2021-10-02 MED ORDER — PROCHLORPERAZINE MALEATE 10 MG PO TABS: 10.0000 mg | ORAL_TABLET | Freq: Four times a day (QID) | ORAL | 1 refills | Status: DC | PRN

## 2021-10-02 MED ORDER — SODIUM CHLORIDE 0.9 % IV SOLN: Freq: Once | INTRAVENOUS | Status: AC

## 2021-10-02 MED ORDER — SODIUM CHLORIDE 0.9 % IV SOLN
400.0000 mg/m2 | Freq: Once | INTRAVENOUS | Status: AC
  Administered 2021-10-02: 780 mg via INTRAVENOUS
  Filled 2021-10-02: qty 39

## 2021-10-02 MED ORDER — PALONOSETRON HCL INJECTION 0.25 MG/5ML
0.2500 mg | Freq: Once | INTRAVENOUS | Status: AC
  Administered 2021-10-02: 0.25 mg via INTRAVENOUS
  Filled 2021-10-02: qty 5

## 2021-10-02 NOTE — Progress Notes (Signed)
Patient seen by Dr. Sherrill today ? ?Vitals are within treatment parameters. ? ?Labs reviewed by Dr. Sherrill and are within treatment parameters. ? ?Per physician team, patient is ready for treatment and there are NO modifications to the treatment plan.  ?

## 2021-10-02 NOTE — Progress Notes (Signed)
?[facility] ?OFFICE PROGRESS NOTE ? ? ?Diagnosis: Non-Hodgkin's lymphoma ? ?INTERVAL HISTORY:  ? ?Dr. Annamaria Boots completed another cycle of CHOP-rituximab on 09/11/2021.  He had 1 ulcer at the right buccal mucosa.  No rash or peripheral numbness.  He he reports diminished grip strength in his hands.  This had also been an issue prior to beginning chemotherapy.  No change in chronic numbness at one of the feet.  He is exercising.  He continues to have exertional dyspnea.  He has intermittent nausea. ? ?He noted swelling in the right greater than left leg last week.  He feels this may have been related to increased salt intake.  No erythema or pain. ? ?He has hot flashes. ?Objective: ? ?Vital signs in last 24 hours: ? ?Blood pressure 133/81, pulse 98, temperature 97.9 ?F (36.6 ?C), temperature source Oral, resp. rate 18, height _0  (1.753 m), weight 174 lb 3.2 oz (79 kg), SpO2 98 %. ?  ? ?HEENT: No thrush or ulcers ?Lymphatics: No cervical, supraclavicular, axillary, or inguinal nodes ?Resp: Inspiratory rales at the right greater than left lower posterior chest, no respiratory distress ?Cardio: Regular rate and rhythm, no gallop ?GI: No hepatosplenomegaly ?Vascular: No leg edema ?Neuro: Mild decrease in grip strength at the left greater than right hand, the hand strength is otherwise intact.  5/5 strength with dorsi flexion at the feet  ? ? ?Lab Results: ? ?Lab Results  ?Component Value Date  ? WBC 3.1 (L) 10/02/2021  ? HGB 11.1 (L) 10/02/2021  ? HCT 31.9 (L) 10/02/2021  ? MCV 91.1 10/02/2021  ? PLT 218 10/02/2021  ? NEUTROABS 2.0 10/02/2021  ? ? ?CMP  ?Lab Results  ?Component Value Date  ? NA 131 (L) 10/02/2021  ? K 4.1 10/02/2021  ? CL 97 (L) 10/02/2021  ? CO2 24 10/02/2021  ? GLUCOSE 145 (H) 10/02/2021  ? BUN 9 10/02/2021  ? CREATININE 0.65 10/02/2021  ? CALCIUM 9.3 10/02/2021  ? PROT 6.7 10/02/2021  ? ALBUMIN 4.1 10/02/2021  ? AST 22 10/02/2021  ? ALT 15 10/02/2021  ? ALKPHOS 39 10/02/2021  ? BILITOT  0.4 10/02/2021  ? GFRNONAA >60 10/02/2021  ? GFRAA >60 06/01/2017  ? ? ? ?Imaging: ? ?NM PET Image Restag (PS) Skull Base To Thigh ? ?Result Date: 09/30/2021 ?CLINICAL DATA:  Subsequent treatment strategy for non-Hodgkin's lymphoma. EXAM: NUCLEAR MEDICINE PET SKULL BASE TO THIGH TECHNIQUE: 8.15 mCi F-18 FDG was injected intravenously. Full-ring PET imaging was performed from the skull base to thigh after the radiotracer. CT data was obtained and used for attenuation correction and anatomic localization. Fasting blood glucose: 119 mg/dl COMPARISON:  Multiple priors including most recent PET-CT May 26, 2021 FINDINGS: Mediastinal blood pool activity: SUV max 2.49 Liver activity: SUV max 3.8 NECK: Near complete resolution of the previously index and non index hypermetabolic cervical lymph nodes now with out measurable hypermetabolic disease in the neck. No new or enlarging hypermetabolic cervical lymph nodes identified. The previously indexed right level 3 lymph node is now too small to accurately characterize previously measuring 11 mm with a max SUV of 10.9. Incidental CT findings: Streak artifact from dental hardware. Bilateral carotid artery calcifications. CHEST: Near complete interval resolution of the index and non index mediastinal and axillary lymph nodes, none of which now demonstrate significant residual hypermetabolic activity. No new or enlarging hypermetabolic thoracic lymph nodes identified. Previously index lymph nodes are as follows: -right paratracheal lymph node now measures 3 mm in short axis on  image 67/4 without significant residual hypermetabolic activity previously measuring 8 mm in short axis with a max SUV of 10.8. -left axillary lymph node is now too small to accurately correct characterize previously measuring 6 mm in short axis with a max SUV of 8.2. Incidental CT findings: Similar changes of pulmonary fibrosis. No suspicious pulmonary nodules identified. Aortic atherosclerosis without  aneurysmal dilation. Coronary artery calcifications. Calcifications of the mitral annulus. Mild cardiac enlargement. No significant pericardial effusion/thickening. ABDOMEN/PELVIS: Near complete interval resolution the previously indexed and non indexed hypermetabolic retroperitoneal abdominopelvic lymph nodes, none of which now demonstrate significant abnormal hypermetabolic activity. Previously index lymph nodes are as follows: -Left periaortic lymph node now measures 2-3 mm in short axis on image 130/4 without significant residual hypermetabolic activity previously measuring 7 mm with a max SUV of 8.5. Wall thickening with hypermetabolic activity involving decompressed transverse colon on coronal fused image 22/603 and a short segment of the sigmoid colon on coronal fused image 37/603. No abnormal hypermetabolic activity within the liver, pancreas, adrenal glands, or spleen. Incidental CT findings: Cholelithiasis without findings of acute cholecystitis. No hydronephrosis. Colonic diverticulosis without findings of acute diverticulitis. 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 Aortic atherosclerosis. Tiny fat containing left inguinal hernia. SKELETON: No new focal hypermetabolic activity to suggest skeletal metastasis. Similar symmetric low-level hypermetabolic activity within sacral ala bilaterally without associated lytic or blastic lesion seen on CT and now with a max SUV of 2.8 on the left previously 3.1. Incidental CT findings: Convex right scoliosis with prominent multilevel degenerative changes spine and multifocal degenerative joint disease. IMPRESSION: 1. Near complete resolution of the hypermetabolic lymph nodes above and below the diaphragm, now without significant hypermetabolic nodal disease, consistent with treatment response. 2. Wall thickening with associated hypermetabolic activity involving decompressed transverse colon and a short segment of sigmoid colon, possibly physiologic or  infectious/inflammatory in etiology. However, lymphomatous involvement, while considered much less likely, can not be excluded on this study. 3.  No splenomegaly or evidence of osseous lymphomatous involvement. Electronically Signed   By: Dahlia Bailiff M.D.   On: 09/30/2021 10:48   ? ?Medications: I have reviewed the patient's current medications. ? ? ?Assessment/Plan: ?Non-Hodgkin's lymphoma ?CTs neck and chest 03/19/2021-cluster of left supraclavicular nodes measuring less than 1 cm, 12 mm right node next to the jugular vein, chest with slight interval progression of pulmonary fibrosis, new small sclerotic lesion in the right third rib concerning for metastasis, lucent lesion in T8 corresponding to abnormal uptake on a bone scan 05/21/2020 suspicious for malignancy, subpectoral nodes-subcentimeter in size but larger than 03/28/2019 ?FNA of left neck node 03/25/2021-atypical lymphoid proliferation ?Excisional biopsy of level 5 left neck node 04/07/2021-lymph node with marked autolysis and diffusely infiltrated by atypical lymphocytes, 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

## 2021-10-02 NOTE — Patient Instructions (Signed)
? ?[facility]   ?Discharge Instructions: ?Thank you for choosing [facility] to provide your oncology and hematology care.  ? ?If you have a lab appointment with the [facility], please go directly to the [facility] and check in at the registration area. ?  ?Wear comfortable clothing and clothing appropriate for easy access to any Portacath or PICC line.  ? ?We strive to give you quality time with your provider. You may need to reschedule your appointment if you arrive late (15 or more minutes).  Arriving late affects you and other patients whose appointments are after yours.  Also, if you miss three or more appointments without notifying the office, you may be dismissed from the clinic at the provider?s discretion.    ?  ?For prescription refill requests, have your pharmacy contact our office and allow 72 hours for refills to be completed.   ? ?Today you received the following chemotherapy and/or immunotherapy agents Doxorubicin, Vincristine, Cytoxan, Rituximab    ?  ?To help prevent nausea and vomiting after your treatment, we encourage you to take your nausea medication as directed. ? ?BELOW ARE SYMPTOMS THAT SHOULD BE REPORTED IMMEDIATELY: ?*FEVER GREATER THAN 100.4 F (38 ?C) OR HIGHER ?*CHILLS OR SWEATING ?*NAUSEA AND VOMITING THAT IS NOT CONTROLLED WITH YOUR NAUSEA MEDICATION ?*UNUSUAL SHORTNESS OF BREATH ?*UNUSUAL BRUISING OR BLEEDING ?*URINARY PROBLEMS (pain or burning when urinating, or frequent urination) ?*BOWEL PROBLEMS (unusual diarrhea, constipation, pain near the anus) ?TENDERNESS IN MOUTH AND THROAT WITH OR WITHOUT PRESENCE OF ULCERS (sore throat, sores in mouth, or a toothache) ?UNUSUAL RASH, SWELLING OR PAIN  ?UNUSUAL VAGINAL DISCHARGE OR ITCHING  ? ?Items with * indicate a potential emergency and should be followed up as soon as possible or go to the Emergency Department if any problems should occur. ? ?Please show the CHEMOTHERAPY ALERT CARD or  IMMUNOTHERAPY ALERT CARD at check-in to the Emergency Department and triage nurse. ? ?Should you have questions after your visit or need to cancel or reschedule your appointment, please contact [facility]  Dept: [phone]  and follow the prompts.  Office hours are 8:00 a.m. to 4:30 p.m. Monday - Friday. Please note that voicemails left after 4:00 p.m. may not be returned until the following business day.  We are closed weekends and major holidays. You have access to a nurse at all times for urgent questions. Please call the main number to the clinic Dept: [phone] and follow the prompts. ? ? ?For any non-urgent questions, you may also contact your provider using MyChart. We now offer e-Visits for anyone 66 and older to request care online for non-urgent symptoms. For details visit [email or web address]. ?  ?Also download the MyChart app! Go to the app store, search "MyChart", open the app, select [facility], and log in with your MyChart username and password. ? ?Due to Covid, a mask is required upon entering the hospital/clinic. If you do not have a mask, one will be given to you upon arrival. For doctor visits, patients may have 1 support person aged 27 or older with them. For treatment visits, patients cannot have anyone with them due to current Covid guidelines and our immunocompromised population.  ? ?Doxorubicin injection ?What is this medication? ?DOXORUBICIN (dox oh ROO bi sin) is a chemotherapy drug. It is used to treat many kinds of cancer like leukemia, lymphoma, neuroblastoma, sarcoma, and Wilms' tumor. It is also used to treat bladder cancer, breast cancer, lung  cancer, ovarian cancer, stomach cancer, and thyroid cancer. ?This medicine may be used for other purposes; ask your health care provider or pharmacist if you have questions. ?COMMON BRAND NAME(S): Adriamycin, Adriamycin PFS, Adriamycin RDF, Rubex ?What should I tell my care team before I take this  medication? ?They need to know if you have any of these conditions: ?heart disease ?history of low blood counts caused by a medicine ?liver disease ?recent or ongoing radiation therapy ?an unusual or allergic reaction to doxorubicin, other chemotherapy agents, other medicines, foods, dyes, or preservatives ?pregnant or trying to get pregnant ?breast-feeding ?How should I use this medication? ?This drug is given as an infusion into a vein. It is administered in a hospital or clinic by a specially trained health care professional. If you have pain, swelling, burning or any unusual feeling around the site of your injection, tell your health care professional right away. ?Talk to your pediatrician regarding the use of this medicine in children. Special care may be needed. ?Overdosage: If you think you have taken too much of this medicine contact a poison control center or emergency room at once. ?NOTE: This medicine is only for you. Do not share this medicine with others. ?What if I miss a dose? ?It is important not to miss your dose. Call your doctor or health care professional if you are unable to keep an appointment. ?What may interact with this medication? ?This medicine may interact with the following medications: ?6-mercaptopurine ?paclitaxel ?phenytoin ?St. John's Wort ?trastuzumab ?verapamil ?This list may not describe all possible interactions. Give your health care provider a list of all the medicines, herbs, non-prescription drugs, or dietary supplements you use. Also tell them if you smoke, drink alcohol, or use illegal drugs. Some items may interact with your medicine. ?What should I watch for while using this medication? ?This drug may make you feel generally unwell. This is not uncommon, as chemotherapy can affect healthy cells as well as cancer cells. Report any side effects. Continue your course of treatment even though you feel ill unless your doctor tells you to stop. ?There is a maximum amount of  this medicine you should receive throughout your life. The amount depends on the medical condition being treated and your overall health. Your doctor will watch how much of this medicine you receive in your lifetime. Tell your doctor if you have taken this medicine before. ?You may need blood work done while you are taking this medicine. ?Your urine may turn red for a few days after your dose. This is not blood. If your urine is dark or brown, call your doctor. ?In some cases, you may be given additional medicines to help with side effects. Follow all directions for their use. ?Call your doctor or health care professional for advice if you get a fever, chills or sore throat, or other symptoms of a cold or flu. Do not treat yourself. This drug decreases your body's ability to fight infections. Try to avoid being around people who are sick. ?This medicine may increase your risk to bruise or bleed. Call your doctor or health care professional if you notice any unusual bleeding. ?Talk to your doctor about your risk of cancer. You may be more at risk for certain types of cancers if you take this medicine. ?Do not become pregnant while taking this medicine or for 6 months after stopping it. Women should inform their doctor if they wish to become pregnant or think they might be pregnant. Men should not father  a child while taking this medicine and for 6 months after stopping it. There is a potential for serious side effects to an unborn child. Talk to your health care professional or pharmacist for more information. Do not breast-feed an infant while taking this medicine. ?This medicine has caused ovarian failure in some women and reduced sperm counts in some men This medicine may interfere with the ability to have a child. Talk with your doctor or health care professional if you are concerned about your fertility. ?This medicine may cause a decrease in Co-Enzyme Q-10. You should make sure that you get enough Co-Enzyme  Q-10 while you are taking this medicine. Discuss the foods you eat and the vitamins you take with your health care professional. ?What side effects may I notice from receiving this medication? ?Side effects that you shou

## 2021-10-02 NOTE — Progress Notes (Signed)
CMP added ? ?

## 2021-10-02 NOTE — Progress Notes (Signed)
Patient presents for treatment. RN assessment completed along with the following: ? ?Labs/vitals reviewed - Yes, and within treatment parameters.   ?Weight within 10% of previous measurement - Yes ?Informed consent completed and reflects current therapy/intent - Yes, on date 07/10/2021             ?Provider progress note reviewed - Today's note is completed from [facility] ?Treatment/Antibody/Supportive plan reviewed - Yes, and there are no adjustments needed for today's treatment. ?S&H and other orders reviewed - Yes, and there are no additional orders identified. ?Previous treatment date reviewed - Yes, and the appropriate amount of time has elapsed between treatments. ?Clinic Hand Off Received from - Cristy Friedlander, RN ? ?Patient to proceed with treatment.   ?

## 2021-10-03 NOTE — Progress Notes (Signed)
Blood return checked and present in PIV every 2 ml during adriamycin infusion and after adriamycin.  ? ?Blood return checked and present before, during and after Vincristine infusion.  ?

## 2021-10-09 ENCOUNTER — Other Ambulatory Visit (HOSPITAL_COMMUNITY): Payer: Self-pay

## 2021-10-10 ENCOUNTER — Encounter (HOSPITAL_COMMUNITY)
Admission: RE | Admit: 2021-10-10 | Discharge: 2021-10-10 | Disposition: A | Discharge status: Home / Self Care | Payer: PPO | Source: Ambulatory Visit | Attending: Internal Medicine | Admitting: Internal Medicine

## 2021-10-10 DIAGNOSIS — M810 Age-related osteoporosis without current pathological fracture: Secondary | ICD-10-CM | POA: Diagnosis not present

## 2021-10-10 MED ORDER — DENOSUMAB 60 MG/ML ~~LOC~~ SOSY
PREFILLED_SYRINGE | SUBCUTANEOUS | Status: AC
  Filled 2021-10-10: qty 1

## 2021-10-10 MED ORDER — DENOSUMAB 60 MG/ML ~~LOC~~ SOSY
60.0000 mg | PREFILLED_SYRINGE | Freq: Once | SUBCUTANEOUS | Status: AC
  Administered 2021-10-10: 60 mg via SUBCUTANEOUS

## 2021-10-14 DIAGNOSIS — C61 Malignant neoplasm of prostate: Secondary | ICD-10-CM | POA: Diagnosis not present

## 2021-10-16 ENCOUNTER — Other Ambulatory Visit: Payer: Self-pay | Admitting: Oncology

## 2021-10-20 DIAGNOSIS — C61 Malignant neoplasm of prostate: Secondary | ICD-10-CM | POA: Diagnosis not present

## 2021-10-20 DIAGNOSIS — R3915 Urgency of urination: Secondary | ICD-10-CM | POA: Diagnosis not present

## 2021-10-23 ENCOUNTER — Encounter: Payer: Self-pay | Admitting: Nurse Practitioner

## 2021-10-23 ENCOUNTER — Inpatient Hospital Stay: Payer: PPO | Attending: Oncology

## 2021-10-23 ENCOUNTER — Inpatient Hospital Stay (HOSPITAL_BASED_OUTPATIENT_CLINIC_OR_DEPARTMENT_OTHER): Payer: PPO | Admitting: Nurse Practitioner

## 2021-10-23 ENCOUNTER — Inpatient Hospital Stay: Payer: PPO

## 2021-10-23 ENCOUNTER — Encounter: Payer: Self-pay | Admitting: *Deleted

## 2021-10-23 VITALS — BP 129/75 | HR 85 | Temp 98.2°F | Resp 18 | Wt 172.0 lb

## 2021-10-23 VITALS — BP 138/93 | HR 83 | Temp 98.1°F | Resp 18

## 2021-10-23 DIAGNOSIS — K589 Irritable bowel syndrome without diarrhea: Secondary | ICD-10-CM | POA: Diagnosis not present

## 2021-10-23 DIAGNOSIS — Z5112 Encounter for antineoplastic immunotherapy: Secondary | ICD-10-CM | POA: Diagnosis not present

## 2021-10-23 DIAGNOSIS — C8288 Other types of follicular lymphoma, lymph nodes of multiple sites: Secondary | ICD-10-CM | POA: Diagnosis not present

## 2021-10-23 DIAGNOSIS — C8330 Diffuse large B-cell lymphoma, unspecified site: Secondary | ICD-10-CM

## 2021-10-23 DIAGNOSIS — Z5111 Encounter for antineoplastic chemotherapy: Secondary | ICD-10-CM | POA: Diagnosis not present

## 2021-10-23 DIAGNOSIS — Z5189 Encounter for other specified aftercare: Secondary | ICD-10-CM | POA: Insufficient documentation

## 2021-10-23 DIAGNOSIS — Z79899 Other long term (current) drug therapy: Secondary | ICD-10-CM | POA: Diagnosis not present

## 2021-10-23 DIAGNOSIS — C61 Malignant neoplasm of prostate: Secondary | ICD-10-CM | POA: Insufficient documentation

## 2021-10-23 LAB — CMP (CANCER CENTER ONLY)
ALT: 14 U/L (ref 0–44)
AST: 20 U/L (ref 15–41)
Albumin: 4.2 g/dL (ref 3.5–5.0)
Alkaline Phosphatase: 42 U/L (ref 38–126)
Anion gap: 8 (ref 5–15)
BUN: 12 mg/dL (ref 8–23)
CO2: 26 mmol/L (ref 22–32)
Calcium: 9.4 mg/dL (ref 8.9–10.3)
Chloride: 101 mmol/L (ref 98–111)
Creatinine: 0.68 mg/dL (ref 0.61–1.24)
GFR, Estimated: >60 mL/min (ref >60)
Glucose, Bld: 134 mg/dL — ABNORMAL HIGH (ref 70–99)
Potassium: 4.1 mmol/L (ref 3.5–5.1)
Sodium: 135 mmol/L (ref 135–145)
Total Bilirubin: 0.4 mg/dL (ref 0.3–1.2)
Total Protein: 6.7 g/dL (ref 6.5–8.1)

## 2021-10-23 LAB — CBC WITH DIFFERENTIAL (CANCER CENTER ONLY)
Abs Immature Granulocytes: 0.02 10*3/uL (ref 0.00–0.07)
Basophils Absolute: 0 10*3/uL (ref 0.0–0.1)
Basophils Relative: 1 %
Eosinophils Absolute: 0.2 10*3/uL (ref 0.0–0.5)
Eosinophils Relative: 7 %
HCT: 31.2 % — ABNORMAL LOW (ref 39.0–52.0)
Hemoglobin: 11.2 g/dL — ABNORMAL LOW (ref 13.0–17.0)
Immature Granulocytes: 1 %
Lymphocytes Relative: 16 %
Lymphs Abs: 0.4 10*3/uL — ABNORMAL LOW (ref 0.7–4.0)
MCH: 32.7 pg (ref 26.0–34.0)
MCHC: 35.9 g/dL (ref 30.0–36.0)
MCV: 91 fL (ref 80.0–100.0)
Monocytes Absolute: 0.6 10*3/uL (ref 0.1–1.0)
Monocytes Relative: 22 %
Neutro Abs: 1.4 10*3/uL — ABNORMAL LOW (ref 1.7–7.7)
Neutrophils Relative %: 53 %
Platelet Count: 196 10*3/uL (ref 150–400)
RBC: 3.43 MIL/uL — ABNORMAL LOW (ref 4.22–5.81)
RDW: 13.7 % (ref 11.5–15.5)
WBC Count: 2.6 10*3/uL — ABNORMAL LOW (ref 4.0–10.5)
nRBC: 0 % (ref 0.0–0.2)

## 2021-10-23 LAB — LACTATE DEHYDROGENASE: LDH: 226 U/L — ABNORMAL HIGH (ref 98–192)

## 2021-10-23 MED ORDER — SODIUM CHLORIDE 0.9 % IV SOLN: Freq: Once | INTRAVENOUS | Status: AC

## 2021-10-23 MED ORDER — SODIUM CHLORIDE 0.9 % IV SOLN
400.0000 mg/m2 | Freq: Once | INTRAVENOUS | Status: AC
  Administered 2021-10-23: 780 mg via INTRAVENOUS
  Filled 2021-10-23: qty 39

## 2021-10-23 MED ORDER — SODIUM CHLORIDE 0.9 % IV SOLN
375.0000 mg/m2 | Freq: Once | INTRAVENOUS | Status: AC
  Administered 2021-10-23: 700 mg via INTRAVENOUS
  Filled 2021-10-23: qty 50

## 2021-10-23 MED ORDER — ACETAMINOPHEN 325 MG PO TABS
650.0000 mg | ORAL_TABLET | Freq: Once | ORAL | Status: AC
  Administered 2021-10-23: 650 mg via ORAL
  Filled 2021-10-23: qty 2

## 2021-10-23 MED ORDER — DIPHENHYDRAMINE HCL 25 MG PO CAPS
50.0000 mg | ORAL_CAPSULE | Freq: Once | ORAL | Status: AC
  Administered 2021-10-23: 50 mg via ORAL
  Filled 2021-10-23: qty 2

## 2021-10-23 MED ORDER — PALONOSETRON HCL INJECTION 0.25 MG/5ML
0.2500 mg | Freq: Once | INTRAVENOUS | Status: AC
  Administered 2021-10-23: 0.25 mg via INTRAVENOUS
  Filled 2021-10-23: qty 5

## 2021-10-23 MED ORDER — DOXORUBICIN HCL CHEMO IV INJECTION 2 MG/ML
25.0000 mg/m2 | Freq: Once | INTRAVENOUS | Status: AC
  Administered 2021-10-23: 48 mg via INTRAVENOUS
  Filled 2021-10-23: qty 24

## 2021-10-23 MED ORDER — VINCRISTINE SULFATE CHEMO INJECTION 1 MG/ML
1.0000 mg | Freq: Once | INTRAVENOUS | Status: AC
  Administered 2021-10-23: 1 mg via INTRAVENOUS
  Filled 2021-10-23: qty 1

## 2021-10-23 MED ORDER — SODIUM CHLORIDE 0.9 % IV SOLN
10.0000 mg | Freq: Once | INTRAVENOUS | Status: AC
  Administered 2021-10-23: 10 mg via INTRAVENOUS
  Filled 2021-10-23: qty 10

## 2021-10-23 MED ORDER — SODIUM CHLORIDE 0.9 % IV SOLN
150.0000 mg | Freq: Once | INTRAVENOUS | Status: AC
  Administered 2021-10-23: 150 mg via INTRAVENOUS
  Filled 2021-10-23: qty 150

## 2021-10-23 NOTE — Progress Notes (Signed)
Blood return checked and present in PIV before during every 2 ml during adriamycin infusion and after. Pushed over 15 minutes ? ?Blood return checked and present before, during and after Vincristine. Fluids ran at 200 ml/hr infusion ran over 20 minutes ?

## 2021-10-23 NOTE — Progress Notes (Signed)
Patient presents for treatment. RN assessment completed along with the following: ? ?Labs/vitals reviewed - Labs reviewed by Ned Card NP and are not all within treatment parameters. ANC 1.4--OK to treat ?    ?Weight within 10% of previous measurement - Yes ?Informed consent completed and reflects current therapy/intent - Yes, on date 07/10/21             ?Provider progress note reviewed - Yes, today's provider note was reviewed. ?Treatment/Antibody/Supportive plan reviewed - Yes, and there are no adjustments needed for today's treatment. ?S&H and other orders reviewed - Yes, and there are no additional orders identified. ?Previous treatment date reviewed - Yes, and the appropriate amount of time has elapsed between treatments. ?Clinic Hand Off Received from - Cristy Friedlander, RN ? ?Patient to proceed with treatment.  ? ?

## 2021-10-23 NOTE — Patient Instructions (Addendum)
[facility]  ? Discharge Instructions: ?Thank you for choosing [facility] to provide your oncology and hematology care.  ? ?If you have a lab appointment with the [facility], please go directly to the [facility] and check in at the registration area. ?  ?Wear comfortable clothing and clothing appropriate for easy access to any Portacath or PICC line.  ? ?We strive to give you quality time with your provider. You may need to reschedule your appointment if you arrive late (15 or more minutes).  Arriving late affects you and other patients whose appointments are after yours.  Also, if you miss three or more appointments without notifying the office, you may be dismissed from the clinic at the provider?s discretion.    ?  ?For prescription refill requests, have your pharmacy contact our office and allow 72 hours for refills to be completed.   ? ?Today you received the following chemotherapy and/or immunotherapy agents Doxorubicin, Vincristine, Cytoxan, Ruxience     ?  ?To help prevent nausea and vomiting after your treatment, we encourage you to take your nausea medication as directed. ? ?BELOW ARE SYMPTOMS THAT SHOULD BE REPORTED IMMEDIATELY: ?*FEVER GREATER THAN 100.4 F (38 ?C) OR HIGHER ?*CHILLS OR SWEATING ?*NAUSEA AND VOMITING THAT IS NOT CONTROLLED WITH YOUR NAUSEA MEDICATION ?*UNUSUAL SHORTNESS OF BREATH ?*UNUSUAL BRUISING OR BLEEDING ?*URINARY PROBLEMS (pain or burning when urinating, or frequent urination) ?*BOWEL PROBLEMS (unusual diarrhea, constipation, pain near the anus) ?TENDERNESS IN MOUTH AND THROAT WITH OR WITHOUT PRESENCE OF ULCERS (sore throat, sores in mouth, or a toothache) ?UNUSUAL RASH, SWELLING OR PAIN  ?UNUSUAL VAGINAL DISCHARGE OR ITCHING  ? ?Items with * indicate a potential emergency and should be followed up as soon as possible or go to the Emergency Department if any problems should occur. ? ?Please show the CHEMOTHERAPY ALERT CARD or  IMMUNOTHERAPY ALERT CARD at check-in to the Emergency Department and triage nurse. ? ?Should you have questions after your visit or need to cancel or reschedule your appointment, please contact [facility]  Dept: [phone]  and follow the prompts.  Office hours are 8:00 a.m. to 4:30 p.m. Monday - Friday. Please note that voicemails left after 4:00 p.m. may not be returned until the following business day.  We are closed weekends and major holidays. You have access to a nurse at all times for urgent questions. Please call the main number to the clinic Dept: [phone] and follow the prompts. ? ? ?For any non-urgent questions, you may also contact your provider using MyChart. We now offer e-Visits for anyone 65 and older to request care online for non-urgent symptoms. For details visit [email or web address]. ?  ?Also download the MyChart app! Go to the app store, search "MyChart", open the app, select [facility], and log in with your MyChart username and password. ? ?Due to Covid, a mask is required upon entering the hospital/clinic. If you do not have a mask, one will be given to you upon arrival. For doctor visits, patients may have 1 support person aged 73 or older with them. For treatment visits, patients cannot have anyone with them due to current Covid guidelines and our immunocompromised population.  ? ?Doxorubicin injection ?What is this medication? ?DOXORUBICIN (dox oh ROO bi sin) is a chemotherapy drug. It is used to treat many kinds of cancer like leukemia, lymphoma, neuroblastoma, sarcoma, and Wilms' tumor. It is also used to treat bladder cancer, breast cancer, lung  cancer, ovarian cancer, stomach cancer, and thyroid cancer. ?This medicine may be used for other purposes; ask your health care provider or pharmacist if you have questions. ?COMMON BRAND NAME(S): Adriamycin, Adriamycin PFS, Adriamycin RDF, Rubex ?What should I tell my care team before I take this  medication? ?They need to know if you have any of these conditions: ?heart disease ?history of low blood counts caused by a medicine ?liver disease ?recent or ongoing radiation therapy ?an unusual or allergic reaction to doxorubicin, other chemotherapy agents, other medicines, foods, dyes, or preservatives ?pregnant or trying to get pregnant ?breast-feeding ?How should I use this medication? ?This drug is given as an infusion into a vein. It is administered in a hospital or clinic by a specially trained health care professional. If you have pain, swelling, burning or any unusual feeling around the site of your injection, tell your health care professional right away. ?Talk to your pediatrician regarding the use of this medicine in children. Special care may be needed. ?Overdosage: If you think you have taken too much of this medicine contact a poison control center or emergency room at once. ?NOTE: This medicine is only for you. Do not share this medicine with others. ?What if I miss a dose? ?It is important not to miss your dose. Call your doctor or health care professional if you are unable to keep an appointment. ?What may interact with this medication? ?This medicine may interact with the following medications: ?6-mercaptopurine ?paclitaxel ?phenytoin ?St. John's Wort ?trastuzumab ?verapamil ?This list may not describe all possible interactions. Give your health care provider a list of all the medicines, herbs, non-prescription drugs, or dietary supplements you use. Also tell them if you smoke, drink alcohol, or use illegal drugs. Some items may interact with your medicine. ?What should I watch for while using this medication? ?This drug may make you feel generally unwell. This is not uncommon, as chemotherapy can affect healthy cells as well as cancer cells. Report any side effects. Continue your course of treatment even though you feel ill unless your doctor tells you to stop. ?There is a maximum amount of  this medicine you should receive throughout your life. The amount depends on the medical condition being treated and your overall health. Your doctor will watch how much of this medicine you receive in your lifetime. Tell your doctor if you have taken this medicine before. ?You may need blood work done while you are taking this medicine. ?Your urine may turn red for a few days after your dose. This is not blood. If your urine is dark or brown, call your doctor. ?In some cases, you may be given additional medicines to help with side effects. Follow all directions for their use. ?Call your doctor or health care professional for advice if you get a fever, chills or sore throat, or other symptoms of a cold or flu. Do not treat yourself. This drug decreases your body's ability to fight infections. Try to avoid being around people who are sick. ?This medicine may increase your risk to bruise or bleed. Call your doctor or health care professional if you notice any unusual bleeding. ?Talk to your doctor about your risk of cancer. You may be more at risk for certain types of cancers if you take this medicine. ?Do not become pregnant while taking this medicine or for 6 months after stopping it. Women should inform their doctor if they wish to become pregnant or think they might be pregnant. Men should not father  a child while taking this medicine and for 6 months after stopping it. There is a potential for serious side effects to an unborn child. Talk to your health care professional or pharmacist for more information. Do not breast-feed an infant while taking this medicine. ?This medicine has caused ovarian failure in some women and reduced sperm counts in some men This medicine may interfere with the ability to have a child. Talk with your doctor or health care professional if you are concerned about your fertility. ?This medicine may cause a decrease in Co-Enzyme Q-10. You should make sure that you get enough Co-Enzyme  Q-10 while you are taking this medicine. Discuss the foods you eat and the vitamins you take with your health care professional. ?What side effects may I notice from receiving this medication? ?Side effects that you should r

## 2021-10-23 NOTE — Progress Notes (Signed)
?Kenneth [name] ?OFFICE PROGRESS NOTE ? ? ?Diagnosis: Non-Hodgkin's lymphoma ? ?INTERVAL HISTORY:  ? ?[name] returns as scheduled.  He completed cycle 5R-mini CHOP 10/02/2021.  He has mild intermittent nausea.  This predates chemotherapy.  No mouth sores.  No change in baseline bowel habits of alternating constipation/diarrhea.  No numbness or tingling in the hands or feet.  Stable decrease in grip strength.  Overall good appetite.  Main complaint is progressive fatigue. ? ?Objective: ? ?Vital signs in last 24 hours: ? ?Blood pressure 129/75, pulse 85, temperature 98.2 ?F (36.8 ?C), temperature source Tympanic, resp. rate 18, weight 172 lb (78 kg), SpO2 100 %. ?  ? ?HEENT: No thrush or ulcers. ?Lymphatics: No palpable cervical, supraclavicular, axillary or inguinal lymph nodes. ?Resp: Faint rales at the lung bases right greater than left.  No respiratory distress. ?Cardio: Regular rate and rhythm. ?GI: Abdomen soft and nontender.  No hepatosplenomegaly. ?Vascular: No leg edema. ?Neuro: Mild decrease in grip strength bilaterally, left slightly greater than right. ? ? ?Lab Results: ? ?Lab Results  ?Component Value Date  ? WBC 2.6 (L) 10/23/2021  ? HGB 11.2 (L) 10/23/2021  ? HCT 31.2 (L) 10/23/2021  ? MCV 91.0 10/23/2021  ? PLT 196 10/23/2021  ? NEUTROABS 1.4 (L) 10/23/2021  ? ? ?Imaging: ? ?No results found. ? ?Medications: I have reviewed the patient's current medications. ? ?Assessment/Plan: ?Non-Hodgkin's lymphoma ?CTs neck and chest 03/19/2021-cluster of left supraclavicular nodes measuring less than 1 cm, 12 mm right node next to the jugular vein, chest with slight interval progression of pulmonary fibrosis, new small sclerotic lesion in the right third rib concerning for metastasis, lucent lesion in T8 corresponding to abnormal uptake on a bone scan 05/21/2020 suspicious for malignancy, subpectoral nodes-subcentimeter in size but larger than 03/28/2019 ?FNA of left neck node 03/25/2021-atypical  lymphoid proliferation ?Excisional biopsy of level 5 left neck node 04/07/2021-lymph node with marked autolysis and diffusely infiltrated by atypical lymphocytes, 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 ?Cycle 1 R-mini CHOP 07/10/2021 ?Cycle 2 R-mini CHOP 07/31/2021 ?Cycle 3 R-mini CHOP 08/21/2021 ?Cycle 4 R-mini CHOP 09/11/2021 ?PET 09/29/2021-near complete resolution of hypermetabolic lymph nodes, no remaining hypermetabolic disease, wall thickening and hypermetabolic activity at the transverse and sigmoid colon ?Cycle 5 R-mini CHOP 10/02/2021 ?Cycle 6 R-mini CHOP 10/23/2021, Udenyca 10/24/2021 ?Prostate cancer-T3 adenocarcinoma, Gleason 5+4, 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  ?  ? ?Disposition: [name] appears stable.  He has completed 5 cycles of R-mini CHOP.  He continues to tolerate treatment well.  Plan  to proceed with the sixth and final cycle today as scheduled.  Review of the CBC shows mild neutropenia.  We discussed various options including delaying treatment for 1 week, dose reducing the chemotherapy, proceeding at current doses with the addition of white cell growth factor support on day 2.  Plan to proceed with chemotherapy today as scheduled, Udenyca 10/24/2021.  Potential toxicities associated with white cell growth factor support reviewed including bone pain, rash, splenic rupture.  He agrees to proceed. ? ?He will return for lab and follow-up in approximately 4 weeks.  We are available to see him sooner if needed. ? ?Patient seen with Dr. [name]. ? ? ? ?Kenneth [name] ANP/GNP-BC  ? ?10/23/2021  ?8:57 AM ? ?This was a shared visit with Kenneth [name].  [name] appears stable.  He has completed 5 cycles of R-mini CHOP.  He has mild neutropenia today.  We discussed the risk/benefit of delaying chemotherapy versus proceeding with G-CSF support.  The plan is to add G-CSF and complete the final cycle of chemotherapy today. ? ?I was present for greater than 50% of today's visit.  I performed medical decision making. ? ?Kenneth Manson, MD ? ? ? ? ? ?

## 2021-10-23 NOTE — Progress Notes (Signed)
Patient seen by Ned Card NP today ? ?Vitals are within treatment parameters. ? ?Labs reviewed by Ned Card NP and are not all within treatment parameters. ANC 1.4--OK to treat ? ?Per physician team, patient is ready for treatment. Please note that modifications are being made to the treatment plan including Adding Udenyca to day 2   ?

## 2021-10-24 ENCOUNTER — Inpatient Hospital Stay: Payer: PPO

## 2021-10-24 ENCOUNTER — Encounter: Payer: Self-pay | Admitting: Oncology

## 2021-10-24 VITALS — BP 151/73 | HR 92 | Temp 97.7°F | Resp 18

## 2021-10-24 DIAGNOSIS — Z5112 Encounter for antineoplastic immunotherapy: Secondary | ICD-10-CM | POA: Diagnosis not present

## 2021-10-24 DIAGNOSIS — C8330 Diffuse large B-cell lymphoma, unspecified site: Secondary | ICD-10-CM

## 2021-10-24 MED ORDER — PEGFILGRASTIM-CBQV 6 MG/0.6ML ~~LOC~~ SOSY
6.0000 mg | PREFILLED_SYRINGE | Freq: Once | SUBCUTANEOUS | Status: AC
  Administered 2021-10-24: 6 mg via SUBCUTANEOUS
  Filled 2021-10-24: qty 0.6

## 2021-10-24 NOTE — Patient Instructions (Signed)

## 2021-10-27 ENCOUNTER — Other Ambulatory Visit: Payer: Self-pay

## 2021-10-27 ENCOUNTER — Encounter (HOSPITAL_BASED_OUTPATIENT_CLINIC_OR_DEPARTMENT_OTHER): Payer: Self-pay

## 2021-10-27 ENCOUNTER — Emergency Department (HOSPITAL_BASED_OUTPATIENT_CLINIC_OR_DEPARTMENT_OTHER)
Admission: EM | Admit: 2021-10-27 | Discharge: 2021-10-27 | Disposition: A | Discharge status: Home / Self Care | Payer: PPO | Attending: Emergency Medicine | Admitting: Emergency Medicine

## 2021-10-27 DIAGNOSIS — D72829 Elevated white blood cell count, unspecified: Secondary | ICD-10-CM | POA: Diagnosis not present

## 2021-10-27 DIAGNOSIS — Z7982 Long term (current) use of aspirin: Secondary | ICD-10-CM | POA: Insufficient documentation

## 2021-10-27 DIAGNOSIS — Z20822 Contact with and (suspected) exposure to covid-19: Secondary | ICD-10-CM | POA: Diagnosis not present

## 2021-10-27 DIAGNOSIS — R000 Tachycardia, unspecified: Secondary | ICD-10-CM | POA: Diagnosis not present

## 2021-10-27 LAB — CBC WITH DIFFERENTIAL/PLATELET
Abs Immature Granulocytes: 0.5 10*3/uL — ABNORMAL HIGH (ref 0.00–0.07)
Band Neutrophils: 9 %
Basophils Absolute: 0 10*3/uL (ref 0.0–0.1)
Basophils Relative: 0 %
Eosinophils Absolute: 0 10*3/uL (ref 0.0–0.5)
Eosinophils Relative: 0 %
HCT: 29.7 % — ABNORMAL LOW (ref 39.0–52.0)
Hemoglobin: 10.5 g/dL — ABNORMAL LOW (ref 13.0–17.0)
Lymphocytes Relative: 2 %
Lymphs Abs: 0.3 10*3/uL — ABNORMAL LOW (ref 0.7–4.0)
MCH: 33.1 pg (ref 26.0–34.0)
MCHC: 35.4 g/dL (ref 30.0–36.0)
MCV: 93.7 fL (ref 80.0–100.0)
Metamyelocytes Relative: 3 %
Monocytes Absolute: 0.5 10*3/uL (ref 0.1–1.0)
Monocytes Relative: 3 %
Neutro Abs: 15.8 10*3/uL — ABNORMAL HIGH (ref 1.7–7.7)
Neutrophils Relative %: 83 %
Platelets: 151 10*3/uL (ref 150–400)
RBC: 3.17 MIL/uL — ABNORMAL LOW (ref 4.22–5.81)
RDW: 13.8 % (ref 11.5–15.5)
Smear Review: NORMAL
WBC: 17.2 10*3/uL — ABNORMAL HIGH (ref 4.0–10.5)
nRBC: 0 % (ref 0.0–0.2)

## 2021-10-27 LAB — URINALYSIS, ROUTINE W REFLEX MICROSCOPIC
Bilirubin Urine: NEGATIVE
Glucose, UA: NEGATIVE mg/dL
Hgb urine dipstick: NEGATIVE
Ketones, ur: NEGATIVE mg/dL
Leukocytes,Ua: NEGATIVE
Nitrite: NEGATIVE
Protein, ur: NEGATIVE mg/dL
Specific Gravity, Urine: 1.011 (ref 1.005–1.030)
pH: 6.5 (ref 5.0–8.0)

## 2021-10-27 LAB — RESP PANEL BY RT-PCR (FLU A&B, COVID) ARPGX2
Influenza A by PCR: NEGATIVE
Influenza B by PCR: NEGATIVE
SARS Coronavirus 2 by RT PCR: NEGATIVE

## 2021-10-27 LAB — BASIC METABOLIC PANEL
Anion gap: 7 (ref 5–15)
BUN: 14 mg/dL (ref 8–23)
CO2: 29 mmol/L (ref 22–32)
Calcium: 8.8 mg/dL — ABNORMAL LOW (ref 8.9–10.3)
Chloride: 97 mmol/L — ABNORMAL LOW (ref 98–111)
Creatinine, Ser: 0.6 mg/dL — ABNORMAL LOW (ref 0.61–1.24)
GFR, Estimated: >60 mL/min (ref >60)
Glucose, Bld: 156 mg/dL — ABNORMAL HIGH (ref 70–99)
Potassium: 3.5 mmol/L (ref 3.5–5.1)
Sodium: 133 mmol/L — ABNORMAL LOW (ref 135–145)

## 2021-10-27 LAB — MAGNESIUM: Magnesium: 2 mg/dL (ref 1.7–2.4)

## 2021-10-27 LAB — TROPONIN I (HIGH SENSITIVITY): Troponin I (High Sensitivity): 15 ng/L (ref <18)

## 2021-10-27 NOTE — ED Provider Notes (Signed)
?[facility] EMERGENCY DEPT ?Provider Note ? ? ?CSN: [id number] ?Arrival date & time: 10/27/21  1159 ? ?  ? ?History ? ?Chief Complaint  ?Patient presents with  ? Tachycardia  ? ? ?Kenneth [name] is a 81 y.o. male with history of lymphoma on CHOP therapy presenting to the emergency department with concern for tachycardia.  The patient reports he has noted his resting heart rate has been elevated yesterday.  This occurs intermittently.  Typically his resting heart rate is 80-90 bpm, and his exertional HR can range to 130-140 bpm.  He reports his resting heart rate has been 100-110bpm the past 2 days.  He wonders whether he may be experiencing an adverse reaction to prednisone which was given to him after his most recent treatment.  He also consumes electrolyte powders along with copious amounts of water, and states his patterns do contain caffeine, but has been taking them for a while.  He wonders whether this may be an electrolyte issue.  The patient is a retired Engineer, drilling. ? ?He denies any chest pain or history of coronary disease.  He denies any history of blood clots, DVT or PE, or any issues unilateral calf pain or swelling.  He denies any fevers, chills, productive cough.  He otherwise feels well. ? ?Of note, the patient was noted to be leukopenic on his most recent evaluation and received Pegfilgrastim injection on 10/24/21.  CBC on 10/23/20 with WBC 2.6, Abs neutrophil 1.4. ? ?He is here with his wife at the bedside. ? ?HPI ? ?  ? ?Home Medications ?Prior to Admission medications   ?Medication Sig Start Date End Date Taking? Authorizing Provider  ?acetaminophen-codeine (TYLENOL #3) 300-30 MG per tablet Take 1 tablet by mouth every 8 (eight) hours as needed for moderate pain.    [provider]  ?aspirin EC 81 MG tablet Take 81 mg by mouth 3 (three) times a week.    [provider]  ?Bacillus Coagulans-Inulin (ALIGN PREBIOTIC-PROBIOTIC PO) Take by mouth.    [provider]   ?carisoprodol (SOMA) 350 MG tablet Take 175 mg by mouth 3 (three) times daily as needed for muscle spasms.    [provider]  ?Cholecalciferol (VITAMIN D-3) 1000 UNITS CAPS Take 2,000 Units by mouth every evening.    [provider]  ?CO-ENZYME Q-10 PO Take 1 tablet by mouth every evening.     [provider]  ?Cyanocobalamin (VITAMIN B12) 1000 MCG TBCR Take 1,000 mcg by mouth daily.    [provider]  ?denosumab (PROLIA) 60 MG/ML SOSY injection Inject 60 mg into the skin every 6 (six) months. At [facility]    [provider]  ?diclofenac (VOLTAREN) 75 MG EC tablet Take 75 mg by mouth 2 (two) times daily as needed (pain.). 01/13/18   [provider]  ?diclofenac sodium (VOLTAREN) 1 % GEL Apply 4 g topically 4 (four) times daily as needed (pain.). 01/13/18   [provider]  ?dicyclomine (BENTYL) 10 MG capsule Take one by mouth every 6 hours as needed for abdominal cramping ?Patient taking differently: Take 10 mg by mouth every 6 (six) hours as needed (abdominal spasms.). 01/24/15   Irene Shipper, MD  ?diphenoxylate-atropine (LOMOTIL) 2.5-0.025 MG tablet Take 1 tablet by mouth 4 (four) times daily as needed for diarrhea or loose stools. 02/22/17   [provider]  ?esomeprazole (NEXIUM) 40 MG capsule Take 40 mg by mouth every evening.     [provider]  ?ezetimibe (  ZETIA) 10 MG tablet Take 10 mg by mouth daily. 07/05/19   [provider]  ?Fluticasone Propionate (XHANCE) 93 MCG/ACT EXHU 1 spray in each nostril    [provider]  ?Glucosamine Sulfate 500 MG TABS Take 500 mg by mouth daily.    [provider]  ?HYDROcodone-acetaminophen (NORCO/VICODIN) 5-325 MG tablet Take 1-2 tablets by mouth every 4 (four) hours as needed for moderate pain. 09/05/19   Armandina Gemma, MD  ?leuprolide, 6 Month, (ELIGARD) 45 MG injection Inject 45 mg into the skin every 6 (six) months. Per Dr. Tresa Moore office    [provider]  ?loratadine (CLARITIN) 10 MG tablet Take 10 mg by mouth daily.    [provider]  ?meclizine (ANTIVERT) 25 MG tablet Take 25 mg by mouth 3 (three) times daily as needed for dizziness.     [provider]  ?methocarbamol (ROBAXIN) 500 MG tablet take 1 tab 4 times daily prn spasm 10/27/10   [provider]  ?Multiple Vitamin (MULTIVITAMIN) tablet Take 1 tablet by mouth daily.    [provider]  ?ondansetron (ZOFRAN) 8 MG tablet Take 1 tablet (8 mg total) by mouth every 8 (eight) hours as needed for nausea or vomiting. Start 72 hours after IV chemotherapy administration 10/02/21   Ladell Pier, MD  ?Pirfenidone (ESBRIET) 801 MG TABS Take 1 tablet by mouth 3 (three) times daily with meals. 07/22/21   Brand Males, MD  ?predniSONE (DELTASONE) 20 MG tablet Take 3 tablets (60 mg total) by mouth daily with breakfast. Take on day 2 through day 5 of each chemo cycle (4 days) 10/02/21   Ladell Pier, MD  ?prochlorperazine (COMPAZINE) 10 MG tablet Take 1 tablet (10 mg total) by mouth every 6 (six) hours as needed for nausea. 10/02/21   Ladell Pier, MD  ?   ? ?Allergies    ?Patient has no active allergies.   ? ?Review of Systems   ?Review of Systems ? ?Physical Exam ?Updated Vital Signs ?BP 110/78   Pulse (!) 103   Temp 97.8 ?F (36.6 ?C)   Resp 18   Ht _0  (1.753 m)   Wt 78 kg   SpO2 96%   BMI 25.39 kg/m?  ?Physical Exam ?Constitutional:   ?   General: He is not in acute distress. ?HENT:  ?   Head: Normocephalic and atraumatic.  ?Eyes:  ?   Conjunctiva/sclera: Conjunctivae normal.  ?   Pupils: Pupils are equal, round, and reactive to light.  ?Cardiovascular:  ?   Rate and Rhythm: Regular rhythm. Tachycardia present.  ?Pulmonary:  ?   Effort: Pulmonary effort is normal. No respiratory distress.  ?Abdominal:  ?   General: There is no distension.  ?   Tenderness: There is no abdominal tenderness.  ?Musculoskeletal:     ?   General: No swelling or  tenderness.  ?Skin: ?   General: Skin is warm and dry.  ?Neurological:  ?   General: No focal deficit present.  ?   Mental Status: He is alert and oriented to person, place, and time. Mental status is at baseline.  ?Psychiatric:     ?   Mood and Affect: Mood normal.     ?   Behavior: Behavior normal.  ? ? ?ED Results / Procedures / Treatments   ?Labs ?(all labs ordered are listed, but only abnormal results are displayed) ?Labs Reviewed  ?BASIC METABOLIC PANEL - Abnormal; Notable for the following components:  ?  Result Value  ? Sodium 133 (*)   ? Chloride 97 (*)   ? Glucose, Bld 156 (*)   ? Creatinine, Ser 0.60 (*)   ? Calcium 8.8 (*)   ? All other components within normal limits  ?CBC WITH DIFFERENTIAL/PLATELET - Abnormal; Notable for the following components:  ? WBC 17.2 (*)   ? RBC 3.17 (*)   ? Hemoglobin 10.5 (*)   ? HCT 29.7 (*)   ? Neutro Abs 15.8 (*)   ? Lymphs Abs 0.3 (*)   ? Abs Immature Granulocytes 0.50 (*)   ? All other components within normal limits  ?RESP PANEL BY RT-PCR (FLU A&B, COVID) ARPGX2  ?CULTURE, BLOOD (SINGLE)  ?MAGNESIUM  ?URINALYSIS, ROUTINE W REFLEX MICROSCOPIC  ?TROPONIN I (HIGH SENSITIVITY)  ? ? ?EKG ?None ? ?Radiology ?DG Chest 2 View ? ?Result Date: 10/28/2021 ?CLINICAL DATA:  Shortness breath.  IPF.  Tachycardia. EXAM: CHEST - 2 VIEW COMPARISON:  06/01/2017 FINDINGS: Heart size is UPPER limits normal. Coarse interstitial markings are again noted, with a peripheral predominance. Markings are more prominent compared with prior studies. No focal consolidations or effusions. IMPRESSION: Progression of coarse interstitial markings, consistent with fibrosis. Electronically Signed   By: Nolon Nations M.D.   On: 10/28/2021 16:49   ? ?Procedures ?Procedures  ? ? ?Medications Ordered in ED ?Medications - No data to display ? ?ED Course/ Medical Decision Making/ A&P ?  ?                        ?Medical Decision Making ?Amount and/or Complexity of Data Reviewed ?Labs: ordered. ? ? ?This  patient presents to the ED with concern for tachycardia. This involves an extensive number of treatment options, and is a complaint that carries with it a high risk of complications and morbidity.  The differential diagnosis i

## 2021-10-27 NOTE — ED Triage Notes (Signed)
Patient here POV from Home. ? ?Endorses History of Lymphoma and had recent Chemotherapy Treatment on Thursday. ? ?Endorses Tachycardia since yesterday that has been Intermittent. Patient believes it is due to Prednisone Treatment that he began Friday. ? ?No N/V/D. No Fevers. ? ?NAD Noted during Triage. A&Ox4. GCS 15. Ambulatory. ?

## 2021-10-27 NOTE — Discharge Instructions (Signed)
If your blood culture returns positive, you will receive a phone call the next 1-3 days advising you to return immediately to the ER for IV antibiotics. ?

## 2021-10-28 ENCOUNTER — Telehealth: Payer: Self-pay | Admitting: Internal Medicine

## 2021-10-28 ENCOUNTER — Ambulatory Visit (INDEPENDENT_AMBULATORY_CARE_PROVIDER_SITE_OTHER): Payer: PPO

## 2021-10-28 ENCOUNTER — Encounter: Payer: Self-pay | Admitting: Internal Medicine

## 2021-10-28 ENCOUNTER — Ambulatory Visit (INDEPENDENT_AMBULATORY_CARE_PROVIDER_SITE_OTHER): Payer: PPO | Admitting: Internal Medicine

## 2021-10-28 ENCOUNTER — Other Ambulatory Visit (INDEPENDENT_AMBULATORY_CARE_PROVIDER_SITE_OTHER): Payer: PPO

## 2021-10-28 VITALS — BP 116/62 | HR 126 | Ht 69.0 in | Wt 173.4 lb

## 2021-10-28 DIAGNOSIS — Z9221 Personal history of antineoplastic chemotherapy: Secondary | ICD-10-CM

## 2021-10-28 DIAGNOSIS — J84112 Idiopathic pulmonary fibrosis: Secondary | ICD-10-CM

## 2021-10-28 DIAGNOSIS — R000 Tachycardia, unspecified: Secondary | ICD-10-CM

## 2021-10-28 DIAGNOSIS — R0602 Shortness of breath: Secondary | ICD-10-CM

## 2021-10-28 DIAGNOSIS — D649 Anemia, unspecified: Secondary | ICD-10-CM

## 2021-10-28 LAB — BASIC METABOLIC PANEL
BUN: 14 mg/dL (ref 6–23)
CO2: 28 mEq/L (ref 19–32)
Calcium: 8.6 mg/dL (ref 8.4–10.5)
Chloride: 96 mEq/L (ref 96–112)
Creatinine, Ser: 0.59 mg/dL (ref 0.40–1.50)
GFR: 91.96 mL/min (ref >60.00)
Glucose, Bld: 108 mg/dL — ABNORMAL HIGH (ref 70–99)
Potassium: 4.1 mEq/L (ref 3.5–5.1)
Sodium: 131 mEq/L — ABNORMAL LOW (ref 135–145)

## 2021-10-28 LAB — HEPATIC FUNCTION PANEL
ALT: 53 U/L (ref 0–53)
AST: 24 U/L (ref 0–37)
Albumin: 4 g/dL (ref 3.5–5.2)
Alkaline Phosphatase: 87 U/L (ref 39–117)
Bilirubin, Direct: 0.1 mg/dL (ref 0.0–0.3)
Total Bilirubin: 0.4 mg/dL (ref 0.2–1.2)
Total Protein: 6.3 g/dL (ref 6.0–8.3)

## 2021-10-28 LAB — PHOSPHORUS: Phosphorus: 4.2 mg/dL (ref 2.3–4.6)

## 2021-10-28 LAB — TROPONIN I (HIGH SENSITIVITY): High Sens Troponin I: 13 ng/L (ref 2–17)

## 2021-10-28 LAB — BRAIN NATRIURETIC PEPTIDE: Pro B Natriuretic peptide (BNP): 137 pg/mL — ABNORMAL HIGH (ref 0.0–100.0)

## 2021-10-28 LAB — MAGNESIUM: Magnesium: 1.9 mg/dL (ref 1.5–2.5)

## 2021-10-28 IMAGING — MR MR LUMBAR SPINE WO/W CM
4 of 8 series · 21 of 48 positions shown · IV contrast (16 ML MULTIHANCE)
Comparison: Lumbar MRI 08/14/2013

CLINICAL DATA: Lumbar radiculopathy. Mid pelvic pain 3 weeks.
History of back surgery.

EXAM:
MRI LUMBAR SPINE WITHOUT AND WITH CONTRAST
TECHNIQUE: Multiplanar and multiecho pulse sequences of the lumbar spine were
obtained without and with intravenous contrast.
CONTRAST:  17mL MULTIHANCE GADOBENATE DIMEGLUMINE 529 MG/ML IV SOLN

[Series 7: T1 · sagittal · 4.0mm · 0.73mm/px · 4 of 19 slices shown]
[im 1/19]
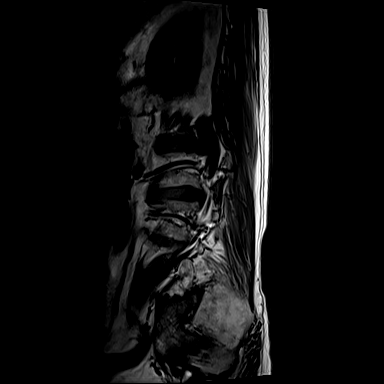
[im 5/19]
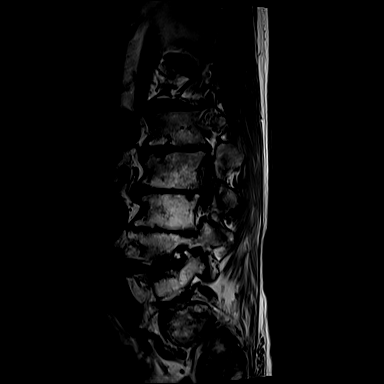
[im 10/19]
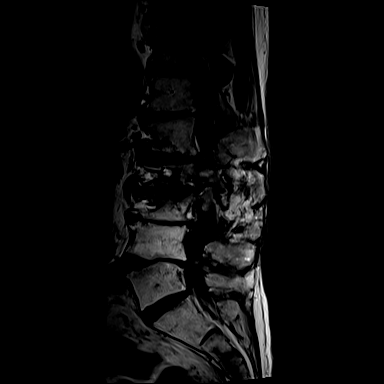
[im 19/19]
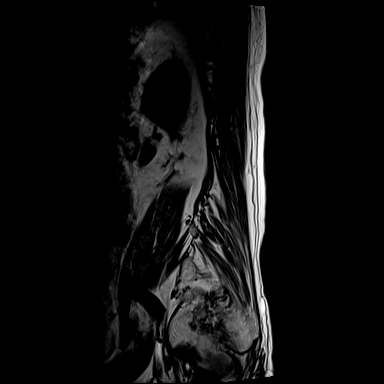

[Series 9: T2 · coronal · 4.0mm · 0.73mm/px · 4 of 17 slices shown (1 of 3)]
[im 1/17]
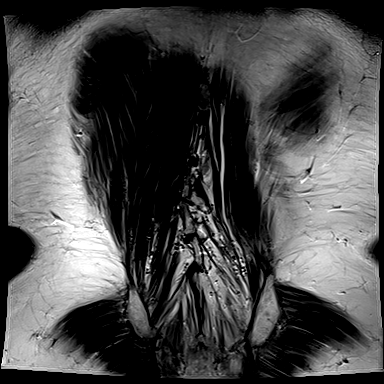
[im 6/17]
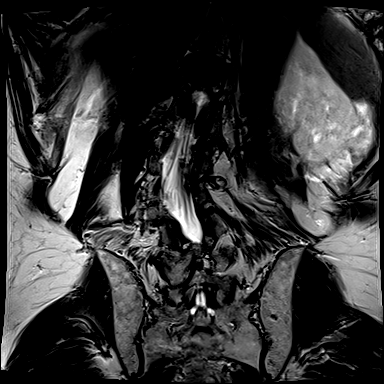
[im 11/17]
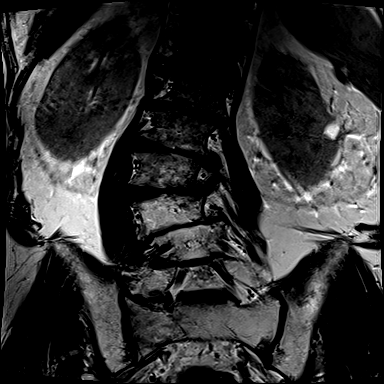
[im 17/17]
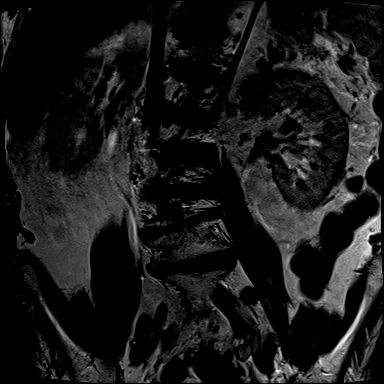

[Series 15: T2 · axial · 4.0mm · 0.28mm/px · z∈[-64,+134]mm · 9 of 39 slices shown (2 of 3)]
[im 1/39]
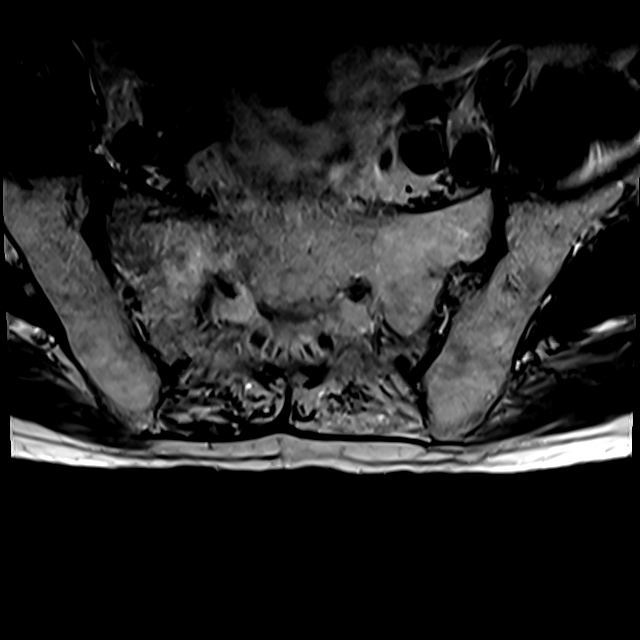
[im 5/39]
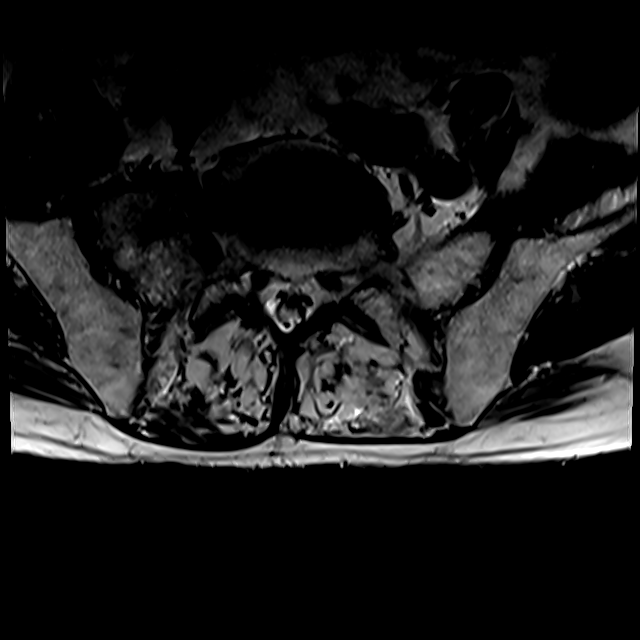
[im 10/39]
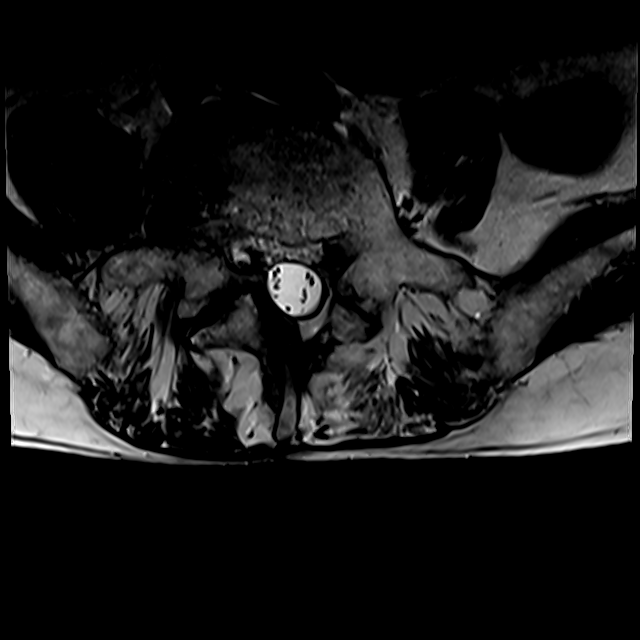
[im 15/39]
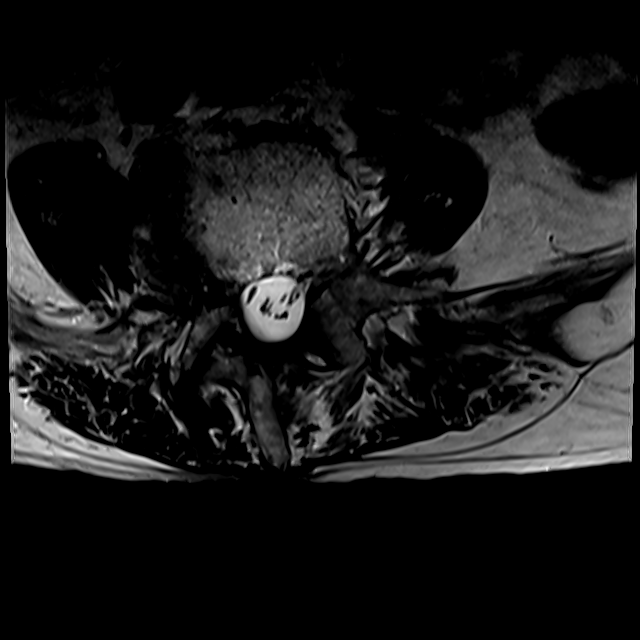
[im 20/39]
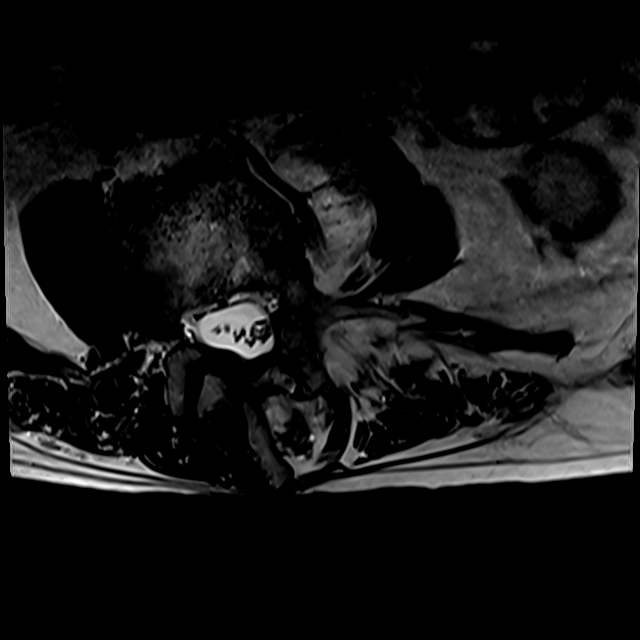
[im 24/39]
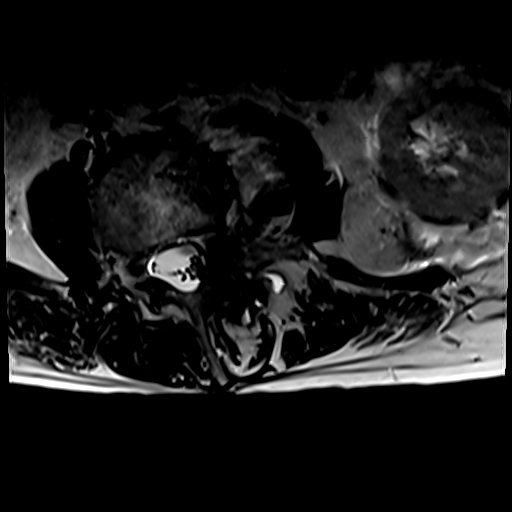
[im 29/39]
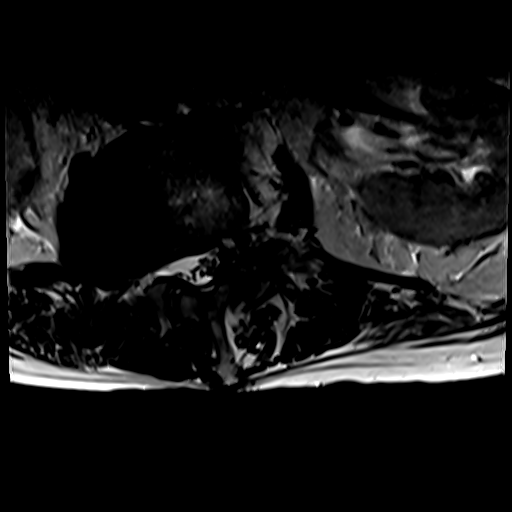
[im 34/39]
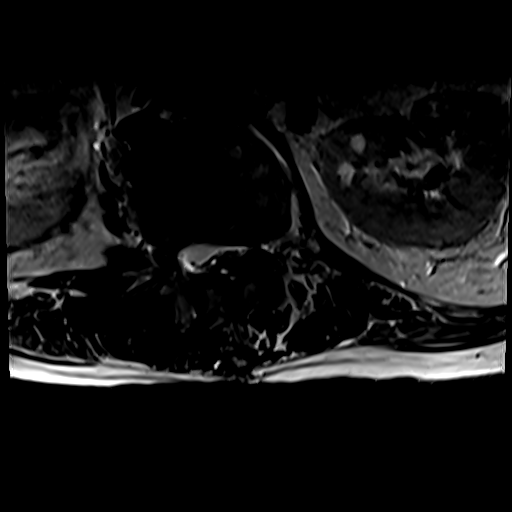
[im 39/39]
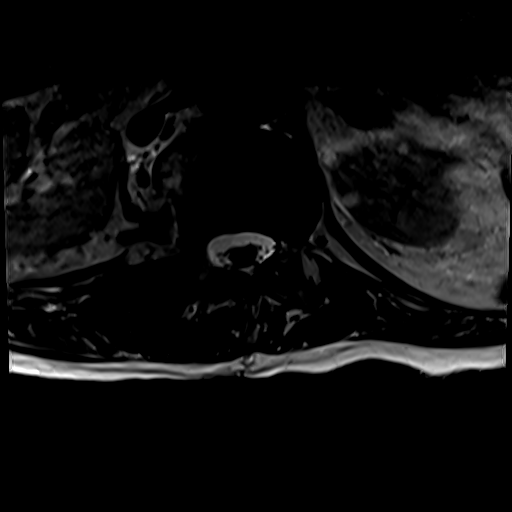

[Series 16: T2 · sagittal · 4.0mm · 0.73mm/px · 4 of 19 slices shown (3 of 3)]
[im 1/19]
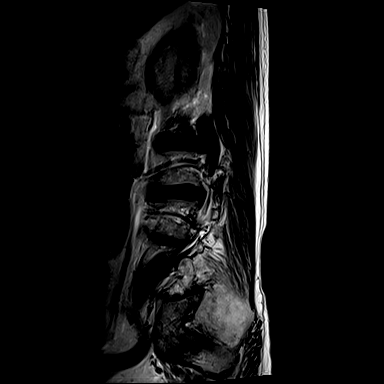
[im 7/19]
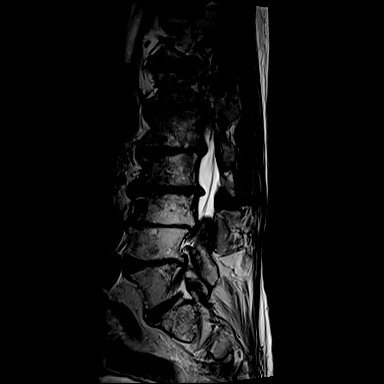
[im 13/19]
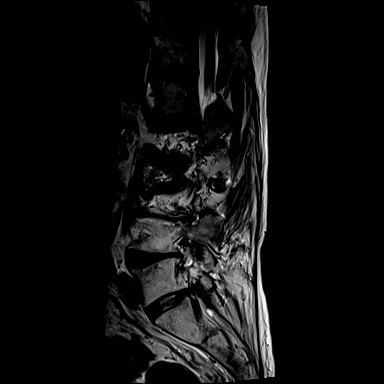
[im 19/19]
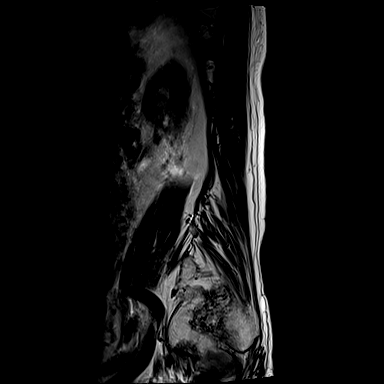

[21 of 48 positions shown; findings below may reference images not displayed]

FINDINGS: Segmentation:  Normal

Alignment:  Moderate dextroscoliosis at L2-3.

Mild anterolisthesis L2-3. Mild retrolisthesis L3-4. 10 mm
retrolisthesis L4-5

Vertebrae: Small superior endplate fracture of L5 with bone marrow
edema enhancement compatible with healing recent fracture. In
addition, there is bone marrow edema and enhancement in the lower
sacrum in the midline extending into the right sacral ala compatible
with healing fracture. These fractures were not present on the prior
MRI 4562.

Conus medullaris and cauda equina: Conus extends to the L1 level.
Conus and cauda equina appear normal.

Paraspinal and other soft tissues: Negative for paraspinous mass or
adenopathy.

Disc levels:

T12-L1: Moderate disc degeneration with disc space narrowing and
diffuse endplate spurring. Bilateral facet hypertrophy. Moderate
subarticular and foraminal stenosis on the right. Moderate left
subarticular stenosis.

L1-2: Asymmetric disc degeneration on the left due to scoliosis.
Disc space narrowing and spurring on the left causing moderate
subarticular and foraminal stenosis. Spinal canal adequate in size.

L2-3: Asymmetric disc degeneration and spurring on the left due to
dextroscoliosis. Moderate to severe subarticular and foraminal
stenosis on the left. No change from the prior study. Prior left
laminectomy. Spinal canal adequate in size.

L3-4: Advanced disc degeneration with disc space narrowing and
spurring diffusely and bilaterally. Bilateral laminectomy with
widely patent spinal canal. Severe subarticular and foraminal
stenosis bilaterally due to spurring. No interval change.

L4-5: Disc degeneration with diffuse endplate spurring, disc
degeneration with extensive endplate spurring. Prior left
laminectomy. Mild spinal stenosis. Bilateral facet hypertrophy.
Moderate to severe subarticular stenosis bilaterally due to
spurring. No interval change.

L5-S1: Minimal disc degeneration. Bilateral facet hypertrophy. Mild
subarticular stenosis bilaterally.
IMPRESSION: 1. Small superior endplate fracture of L5 with bone marrow edema
compatible with recent fracture.
2. Bone marrow edema and enhancement in the mid sacrum and right
sacral ala compatible with healing fractures which appear recent.
3. Scoliosis and advanced lumbar spondylosis. Multilevel foraminal
stenosis similar to 4562.

## 2021-10-28 NOTE — Telephone Encounter (Signed)
?  BNP high ?Trop/D-dimer normal ?Rest labs pending ? ?Plan ? - get echo (? If having any cardic contestion - next few days) ?- cardiology referral - Dr Antionette Poles. [place]  ?- based on other labs might have him start low dose lasix ? ? ? ?LABS  ? ? Latest Reference Range & Units 10/28/21 16:42  ?D-Dimer, Quant <0.50 mcg/mL FEU 0.39  ? ? Latest Reference Range & Units 10/27/21 13:17 10/28/21 16:42  ?Pro B Natriuretic peptide (BNP) 0.0 - 100.0 pg/mL  137.0 (H)  ?Troponin I (High Sensitivity) <18 ng/L 15   ?(H): Data is abnormally high ?PULMONARY ?No results for input(s): PHART, PCO2ART, PO2ART, HCO3, TCO2, O2SAT in the last 168 hours. ? ?Invalid input(s): PCO2, PO2 ? ?CBC ?Recent Labs  ?Lab 10/23/21 ?0840 10/27/21 ?1305  ?HGB 11.2* 10.5*  ?HCT 31.2* 29.7*  ?WBC 2.6* 17.2*  ?PLT 196 151  ? ? ?COAGULATION ?No results for input(s): INR in the last 168 hours. ? ?CARDIAC ? No results for input(s): TROPONINI in the last 168 hours. ?Recent Labs  ?Lab 10/28/21 ?1642  ?PROBNP 137.0*  ? ? ? ?CHEMISTRY ?Recent Labs  ?Lab 10/23/21 ?0840 10/27/21 ?1305  ?NA 135 133*  ?K 4.1 3.5  ?CL 101 97*  ?CO2 26 29  ?GLUCOSE 134* 156*  ?BUN 12 14  ?CREATININE 0.68 0.60*  ?CALCIUM 9.4 8.8*  ?MG  --  2.0  ? ?Estimated Creatinine Clearance: 73.6 mL/min (A) (by C-G formula based on SCr of 0.6 mg/dL (L)). ? ? ?LIVER ?Recent Labs  ?Lab 10/23/21 ?0840  ?AST 20  ?ALT 14  ?ALKPHOS 42  ?BILITOT 0.4  ?PROT 6.7  ?ALBUMIN 4.2  ? ? ? ?INFECTIOUS ?No results for input(s): LATICACIDVEN, PROCALCITON in the last 168 hours. ? ? ?ENDOCRINE ?CBG (last 3)  ?No results for input(s): GLUCAP in the last 72 hours. ? ? ? ? ? ? ?IMAGING x48h  - image(s) personally visualized  -   highlighted in bold ?DG Chest 2 View ? ?Result Date: 10/28/2021 ?CLINICAL DATA:  Shortness breath.  IPF.  Tachycardia. EXAM: CHEST - 2 VIEW COMPARISON:  06/01/2017 FINDINGS: Heart size is UPPER limits normal. Coarse interstitial markings are again noted, with a peripheral  predominance. Markings are more prominent compared with prior studies. No focal consolidations or effusions. IMPRESSION: Progression of coarse interstitial markings, consistent with fibrosis. Electronically Signed   By: Nolon Nations M.D.   On: 10/28/2021 16:49   ? ? ?

## 2021-10-28 NOTE — Patient Instructions (Addendum)
ICD-10-CM   ?1. 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HCC)  J84.112 Pulse oximetry, overnight  ?  ?2. Tachycardia  R00.0 EKG 12-Lead  ?  ?3. Anemia, unspecified type  D64.9   ?  ?4. Status post chemotherapy  Z92.21   ?  ? ? ? ? ?PLAN  ?- ekg 12 lead  ?- cxr ? - lactic acid, procalcitonin, bnp, trop, bmet, mag, phos, lft, cbc, d-dimer ?- continue esbriet and other medications ? - check ono on room air ?- 6 min walk test in 7-10 days ? ?Followup ?- will call with results ? - return next 7-10 days to see APP or Dr [name] to review 79mn walk test and see if you need qualifying o2 ?- keep July appt with Dr RChase Caller?

## 2021-10-28 NOTE — Telephone Encounter (Signed)
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 Not feeling well. TAchy. WEnt to ER Not cause found. Says is desats with exerton ? ?Pl;an ? - bring him in 10/28/2021 PM  - 3.30pm or 2.45pm _ IMPORTANT  ?

## 2021-10-28 NOTE — Progress Notes (Signed)
? ? ? ? ?V 03/04/2017 - new consult ? 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 He tells me that this summer 2018*noticing insidious onset of shortness of breath particularly with walking stairs in the house or [place]. This was not that in the previous years. The summer 2018 he was in [place] but did not notice much shortness of breath. Otherwise he is able to do his activities of daily living and played golf with a car. He is more bothered by his back pain and sciatica as a result of previous back surgery. Shortness of breath is extremely mild. He says he became more aware of it after the radiologic investigations documented below. He had a chest x-ray that suggested interstitial findings and therefore he underwent a high-resolution CT scan of the chest that is described below. I personally visualized this high resolution CT chest and to me it shows bilateral bibasal subpleural reticulation that also extends to the upper lobes without any zonal predominance. There is no obvious honeycombing but there is traction bronchiectasis. There no mediastinal adenopathy. Therefore he is been referred here. In terms of exposure history other than radiation exposure he has not been on any pulmonary toxic drugs or any mold exposure or asbestos exposure. Does have GERD and is on PPI and is well ocntrolled ? ? ?Does have spring and perennial allergies - sneezes easy. Allergy test with [facility] negative some years ago ? ?[employer] chest physicians interstitial lung disease questionnaire: He says that he does not cough. He is only trouble with dyspnea with strenuous exercise and it started 4 months ago. Past medical history significant for acid reflux. He has never smoked any tobacco or street drugs. Does not have any family history of lung disease.  At home he does not have any humidifier sound birds heart the water damage or mold. 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 He has standard radiation exposure while at work. The standard radiation for a radiologist. He does not take any pulmonary toxic drugs. House is 80 years old and he worries about mold. He sneezes if air is forced ? ? ?Pulmonary function test shows isolated reduction in diffusion capacity to 62%. Walking desaturation test underneath her feet ?3 laps on room air: Resting pulse ox 96%. Final pulse ox 95%. Heart rate resting was 92/m and went up to 109 minute. ? ? ?Results for MAXX, PHAM (MRN [id number]) as of 03/23/2017 10:04 ? Ref. Range 03/04/2017 09:13  ?FVC-Pre Latest Units: L 3.59  ?FVC-%Pred-Pre Latest Units: % 86  ?Results for RIC, ROSENBERG (MRN [id number]) as of 03/23/2017 10:04 ? Ref. Range 03/04/2017 09:13  ?DLCO cor Latest Units: ml/min/mmHg 20.06  ?DLCO cor % pred Latest Units: % 63  ? ? ?Lungs/Pleura: There is subpleural reticulation with scattered ?ground-glass and traction bronchiolectasis, without a definite zonal ?predominance. No definitive honeycombing. Findings appear mildly ?progressive when radiographs dating back to 12/28/2008 are reviewed. ?Probable 5 mm subpleural lymph node along the right major fissure. ?No pleural fluid. Airway is unremarkable. ? ? ? ? ? ?IMPRESSION: ?1. Pulmonary parenchymal findings of interstitial lung disease which ?may be due to fibrotic nonspecific interstitial pneumonitis or, ?given slight progression over time, usual interstitial pneumonitis. ?2. Aortic atherosclerosis (ICD10-170.0). Coronary artery ?calcification. ?3. Aortic aneurysm  NOS (ICD10-I71.9). Small ascending aortic ?aneurysm with aortic valvular calcification. Recommend annual ?imaging followup by CTA or MRA. This recommendation follows 2010 ?ACCF/AHA/AATS/ACR/ASA/SCA/SCAI/SIR/STS/SVM Guidelines for the ?Diagnosis and Management of Patients with Thoracic  Aortic Disease. ?Circulation. 2010; 121: G387-F643. ?4. Prominence of the right and left main pulmonary arteries can be ?seen with pulmonary arterial hypertension. ?  ?  ?Electronically Signed ?  By: [name] ?  On: 02/17/2017 13:41 ?   ? ? ?OV 03/23/2017 ? ?Chief Complaint  ?Patient presents with  ? Follow-up  ?  SOB on exertion. Other than that he has been doing about the same. Denies any cough or CP.  ? ?Here to discuss results. [name]. 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 In inteirm no new symptoms.  Autoimmune profile negative. They raise possibility of 2nd opinion.  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 Progression is based on CXR comparison since 2010; there is no CT and is only suspicion of progression ? ?Results for ALROY, PORTELA (MRN [id number]) as of 03/23/2017 10:04 ? Ref. Range 12/08/2011 16:44 03/04/2017 13:47  ?Anit Nuclear Antibody(ANA) Latest Ref Range: NEGATIVE   NEG  ?Angiotensin-Converting Enzyme Latest Ref Range: 9 - 67 U/L  31  ?Cyclic Citrullin Peptide Ab Latest Units: Units  <16  ?ds DNA Ab Latest Units: IU/mL  <1  ?ENA RNP Ab Latest Ref Range: 0.0 - 0.9 AI  0.2  ?Endomysial Screen Latest Ref Range: NEGATIVE  NEGATIVE   ?Deamidated Gliadin Abs, IgG Latest Ref Range: <20 U/mL 4.1   ?Gliadin IgA Latest Ref Range: <20 U/mL 1.8   ?RA Latex Turbid. Latest Ref Range: <14 IU/mL  <14  ?Tissue Transglut Ab Latest Ref Range: <20 U/mL 3.9   ?Tissue Transglutaminase Ab, IgA Latest Ref Range: <20 U/mL 2.2   ?IgE (Immunoglobulin E), Serum Latest Ref Range: <115 kU/L  31  ?IgA Latest Ref Range: 68 - 379 mg/dL 132   ?SSA (Ro) (ENA) Antibody, IgG Latest Ref Range: <1.0 NEG AI   <1.0 NEG  ?SSB (La) (ENA) Antibody, IgG Latest Ref Range: <1.0 NEG AI   <1.0 NEG  ? ? ?OV 05/06/2017 ? ?Chief Complaint  ?Patient presents with  ? Follow-up  ?  Follow up today after beginning [place]. Pt has no c/o SOB,  cough, or CP. Has had some mild nausea from the [place] but other than that, he has been doing good on it.  ? ?ollow-up idiopathic pulmonary fibrosis mild severity ? ?He is here to follow-up He is on the third week of his bed at 3 pills 3 times a day max dose. So far is tolerating it fine other than mild nausea. He does take after meals. He plans to meet with [facility] coordinator for medication support. He did visit [place] this past weekend and did have vomiting especially after consuming few etoh drinks at a [employer]. He does apply sunscreen [his daughter is a Paediatric nurse in [place]. He still complains about the fact that and is 80 year old home when the air conditioner comes on and they air blows out of the duct system he does sneeze 10 times and his feet does feel cold. She plans to put hepa filters that are high and and also get the duct cleaning. He says he did have allergy evaluation and found few years ago and it was negative. Recently he has increased his rpinrole and alsogot  an injection for his back and he does feel better ? ? ?OV 06/22/2017 ? ?Chief Complaint  ?Patient presents with  ? Follow-up  ?  Pt still taking Esbriet and has been doing good except has been having issues with nausea x3 days. PFT done today. Still becomes SOB with exertion. Denies any complaints of cough or CP.  ? ?IPF followup ? ?Routine followup. Now on esbriet 3 pills tid since end oct 2018. Having new nausea - moderate, x 3 days. Might be chills. But no associated diarrhea or other side effects. Spacing esbriet 4h only. Also has complaitns about high co pay. Unable to find charity. Doing exrcise with trainer - feeels that is better and more vigorous than rehab. Gets tachy with eercise but says he does not desaturate. Had PFT - stable. Lot of questions about disease ? ? ? ?Walking desaturation test on 06/22/2017 185 feet x 3 laps on ROOM AIR:  did NOT desaturate. Rest pulse ox was 100%, final pulse ox was 99%. HR  response 84/min at rest to 97/min at peak exertion. Patient NEWTON FRUTIGER  Did not Desaturate < 88% . Westley Chandler did not  Desaturated </= 3% points. Hawke A Bohne yes did get tachyardic ? ? ?OV 09/20/2017 ? ?Ch

## 2021-10-28 NOTE — Telephone Encounter (Signed)
Patient is scheduled for 3:30pm today- nothing further needed. ?

## 2021-10-29 ENCOUNTER — Encounter: Payer: Self-pay | Admitting: Oncology

## 2021-10-29 ENCOUNTER — Other Ambulatory Visit: Payer: PPO

## 2021-10-29 ENCOUNTER — Encounter (HOSPITAL_COMMUNITY): Payer: Self-pay | Admitting: Cardiology

## 2021-10-29 ENCOUNTER — Telehealth (HOSPITAL_COMMUNITY): Payer: Self-pay

## 2021-10-29 ENCOUNTER — Ambulatory Visit (HOSPITAL_BASED_OUTPATIENT_CLINIC_OR_DEPARTMENT_OTHER)
Admission: RE | Admit: 2021-10-29 | Discharge: 2021-10-29 | Disposition: A | Discharge status: Home / Self Care | Payer: PPO | Source: Ambulatory Visit | Attending: Cardiology | Admitting: Cardiology

## 2021-10-29 ENCOUNTER — Other Ambulatory Visit: Payer: Self-pay | Admitting: *Deleted

## 2021-10-29 ENCOUNTER — Ambulatory Visit (HOSPITAL_COMMUNITY)
Admission: RE | Admit: 2021-10-29 | Discharge: 2021-10-29 | Disposition: A | Discharge status: Home / Self Care | Payer: PPO | Source: Ambulatory Visit | Attending: Cardiology | Admitting: Cardiology

## 2021-10-29 VITALS — BP 118/72 | HR 111 | Wt 170.2 lb

## 2021-10-29 DIAGNOSIS — I5022 Chronic systolic (congestive) heart failure: Secondary | ICD-10-CM

## 2021-10-29 DIAGNOSIS — I2510 Atherosclerotic heart disease of native coronary artery without angina pectoris: Secondary | ICD-10-CM | POA: Insufficient documentation

## 2021-10-29 DIAGNOSIS — R000 Tachycardia, unspecified: Secondary | ICD-10-CM | POA: Diagnosis not present

## 2021-10-29 DIAGNOSIS — Z79899 Other long term (current) drug therapy: Secondary | ICD-10-CM | POA: Diagnosis not present

## 2021-10-29 DIAGNOSIS — J84112 Idiopathic pulmonary fibrosis: Secondary | ICD-10-CM | POA: Diagnosis not present

## 2021-10-29 DIAGNOSIS — R0609 Other forms of dyspnea: Secondary | ICD-10-CM | POA: Insufficient documentation

## 2021-10-29 DIAGNOSIS — E785 Hyperlipidemia, unspecified: Secondary | ICD-10-CM | POA: Diagnosis not present

## 2021-10-29 DIAGNOSIS — D649 Anemia, unspecified: Secondary | ICD-10-CM

## 2021-10-29 DIAGNOSIS — I272 Pulmonary hypertension, unspecified: Secondary | ICD-10-CM

## 2021-10-29 DIAGNOSIS — C8590 Non-Hodgkin lymphoma, unspecified, unspecified site: Secondary | ICD-10-CM

## 2021-10-29 DIAGNOSIS — R0602 Shortness of breath: Secondary | ICD-10-CM

## 2021-10-29 LAB — ECHOCARDIOGRAM COMPLETE
AR max vel: 1.95 cm2
AV Area VTI: 2.03 cm2
AV Area mean vel: 1.88 cm2
AV Mean grad: 10 mmHg
AV Peak grad: 15.8 mmHg
Ao pk vel: 1.99 m/s
Area-P 1/2: 4.06 cm2
MV VTI: 3.3 cm2
S' Lateral: 3.5 cm
Weight: 2723.2 oz

## 2021-10-29 LAB — CBC WITH DIFFERENTIAL/PLATELET
Basophils Absolute: 0 10*3/uL (ref 0.0–0.1)
Basophils Relative: 0.3 % (ref 0.0–3.0)
Eosinophils Absolute: 0.2 10*3/uL (ref 0.0–0.7)
Eosinophils Relative: 1.3 % (ref 0.0–5.0)
HCT: 29.4 % — ABNORMAL LOW (ref 39.0–52.0)
Hemoglobin: 10.2 g/dL — ABNORMAL LOW (ref 13.0–17.0)
Lymphocytes Relative: 5 % — ABNORMAL LOW (ref 12.0–46.0)
Lymphs Abs: 0.6 10*3/uL — ABNORMAL LOW (ref 0.7–4.0)
MCHC: 34.8 g/dL (ref 30.0–36.0)
MCV: 94.6 fl (ref 78.0–100.0)
Monocytes Absolute: 0.8 10*3/uL (ref 0.1–1.0)
Monocytes Relative: 6.5 % (ref 3.0–12.0)
Neutro Abs: 10.4 10*3/uL — ABNORMAL HIGH (ref 1.4–7.7)
Neutrophils Relative %: 86.9 % — ABNORMAL HIGH (ref 43.0–77.0)
Platelets: 141 10*3/uL — ABNORMAL LOW (ref 150.0–400.0)
RBC: 3.11 Mil/uL — ABNORMAL LOW (ref 4.22–5.81)
RDW: 14.5 % (ref 11.5–15.5)
WBC: 12 10*3/uL — ABNORMAL HIGH (ref 4.0–10.5)

## 2021-10-29 LAB — TSH: TSH: 0.944 u[IU]/mL (ref 0.350–4.500)

## 2021-10-29 NOTE — Telephone Encounter (Signed)
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  ? ?Kenneth [name]: please do amb ref to Dr Loralie Champagne in cards ? ?Thanks ? ? ? ?SIGNATURE  ? ? ?Dr. Brand Males, M.D., F.C.C.P,  ?Pulmonary and Critical Care Medicine ?Staff Physician, [place] ?Center Director - Interstitial Lung Disease  Program  ?Medical Director - [place] [facility] ?[facility] ?[place], [place], [address] ? ?NPI Number:  NPI #[id number] ?DEA Number: [id number] ? ?Pager: [phone], If no answer  -> Check AMION or Try [phone] ?Telephone (clinical office): [phone] ?Telephone (research): [phone] ? ?9:01 AM ?10/29/2021 ? ?

## 2021-10-29 NOTE — Progress Notes (Signed)
PCP: [name], MD ?Pulmonology: Dr. [name] ?Cardiology: Dr. Aundra Dubin ? ?81 y.o. with history of pulmonary fibrosis, high grade B cell lymphoma, and prostate cancer was referred by Dr. [name] for evaluation of dyspnea and tachycardia.  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  He is on Esbriet.  He has not used home oxygen.  He has baseline exertional dyspnea.  In 5/22, he had XRT for prostate cancer.  In 9/22, he was diagnosed with high grade B cell lymphoma and finished his 6th cycle of mini-CHOP in 4/23.  Pre-op echo in 9/22 showed EF 65-70%, normal RV, unable to estimate PASP, mild AS mean gradient 12 mmHg.  Patient has been noted by CT to have coronary calcification.  ? ?Patient had his last dose of chemotherapy last Thurday.  Over the weekend, he noted resting tachycardia with HR 110s-120s at rest and to 130s with walking. This is unusual for him.  His oxygen saturation also dropped to 90% by pulse ox when walking, also unusual for him.  He reports increased exertional dyspnea and lack of energy.  He is short of breath walking about 1 block and walking up stairs is very difficult.  No chest pain.  No orthopnea/PND.  He had some ankle swelling about a month ago but this has resolved.  No lightheadedness and he does not feel palpitations. He had ECGs on 4/24 and 4/25, both showing sinus tachycardia.  ? ?He went to the ER at [facility] yesterday.  D dimer was normal, HS-TnI was normal, BNP was mildly elevated at 137.  He was afebrile.  Hgb stable at 10.2. CXR with changes of pulmonary fibrosis. Blood cultures were done (no growth so far) and he was sent home.  ? ?Labs (4/23): D dimer negative, BNP 137, HS-TnI 13, K 4.1, creatinine 0.59, hgb 10.2, WBCs 12 ? ?ECG (4/24 and 4/25, personally reviewed): Sinus tachycardia ? ?PMH: ?1. IBS ?2. 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  ?- CT chest in 9/22 with pulmonary fibrosis, enlarged PA,  coronary artery calcium.  ?3. CAD: Coronary calcification on 9/22 CT chest.  ?- Cardiolite in 9/18 showed no ischemia or infarction.  ?4. Hyperlipidemia ?5. High grade B cell lymphoma: Diagnosed in 9/22.  He received 6 cycles of mini-CHOP.  ?- Echo (9/22): Prechemo, EF 65-70%, normal RV, unable to estimate PASP, mild AS mean gradient 12 mmHg.  ?6. Aortic stenosis: Mild on 9/22 echo.  ?7. 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  ?8. H/o L-spine compression fracture.  ? ?SH: Married, lives in [place], retired Stage manager, nonsmoker, rare ETOH.  ? ?Family History  ?Problem Relation Age of Onset  ? Heart failure Father 31  ? Bladder Cancer Mother   ? Breast cancer Mother   ? Colon cancer Neg Hx   ? Pancreatic cancer Neg Hx   ? Rectal cancer Neg Hx   ? Stomach cancer Neg Hx   ? Prostate cancer Neg Hx   ? ?ROS: All systems reviewed and negative except as per HPI.  ? ?Current Outpatient Medications  ?Medication Sig Dispense Refill  ? acetaminophen-codeine (TYLENOL #3) 300-30 MG per tablet Take 1 tablet by mouth every 8 (eight) hours as needed for moderate pain.    ? aspirin EC 81 MG tablet Take 81 mg by mouth 3 (three) times a week.    ? Bacillus Coagulans-Inulin (ALIGN PREBIOTIC-PROBIOTIC PO) Take by mouth.    ? carisoprodol (SOMA) 350 MG tablet Take 175 mg  by mouth 3 (three) times daily as needed for muscle spasms.    ? Cholecalciferol (VITAMIN D-3) 1000 UNITS CAPS Take 2,000 Units by mouth every evening.    ? CO-ENZYME Q-10 PO Take 1 tablet by mouth every evening.     ? Cyanocobalamin (VITAMIN B12) 1000 MCG TBCR Take 1,000 mcg by mouth daily.    ? denosumab (PROLIA) 60 MG/ML SOSY injection Inject 60 mg into the skin every 6 (six) months. At [place]    ? diclofenac (VOLTAREN) 75 MG EC tablet Take 75 mg by mouth 2 (two) times daily as needed (pain.).  2  ? diclofenac sodium (VOLTAREN) 1 % GEL Apply 4 g topically 4 (four) times daily as needed (pain.).  12  ? dicyclomine (BENTYL) 10 MG capsule Take one by mouth  every 6 hours as needed for abdominal cramping (Patient taking differently: Take 10 mg by mouth every 6 (six) hours as needed (abdominal spasms.).) 30 capsule 1  ? diphenoxylate-atropine (LOMOTIL) 2.5-0.025 MG tablet Take 1 tablet by mouth 4 (four) times daily as needed for diarrhea or loose stools.    ? esomeprazole (NEXIUM) 40 MG capsule Take 40 mg by mouth every evening.     ? ezetimibe (ZETIA) 10 MG tablet Take 10 mg by mouth daily.    ? Fluticasone Propionate (XHANCE) 93 MCG/ACT EXHU 1 spray in each nostril    ? Glucosamine Sulfate 500 MG TABS Take 500 mg by mouth daily.    ? HYDROcodone-acetaminophen (NORCO/VICODIN) 5-325 MG tablet Take 1-2 tablets by mouth every 4 (four) hours as needed for moderate pain. 24 tablet 0  ? leuprolide, 6 Month, (ELIGARD) 45 MG injection Inject 45 mg into the skin every 6 (six) months. Per Dr. Tresa Moore office    ? loratadine (CLARITIN) 10 MG tablet Take 10 mg by mouth daily.    ? meclizine (ANTIVERT) 25 MG tablet Take 25 mg by mouth 3 (three) times daily as needed for dizziness.     ? methocarbamol (ROBAXIN) 500 MG tablet take 1 tab 4 times daily prn spasm    ? Multiple Vitamin (MULTIVITAMIN) tablet Take 1 tablet by mouth daily.    ? ondansetron (ZOFRAN) 8 MG tablet Take 1 tablet (8 mg total) by mouth every 8 (eight) hours as needed for nausea or vomiting. Start 72 hours after IV chemotherapy administration 30 tablet 1  ? oxybutynin (DITROPAN) 5 MG tablet Take 5 mg by mouth every 8 (eight) hours as needed.    ? Pirfenidone (ESBRIET) 801 MG TABS Take 1 tablet by mouth 3 (three) times daily with meals. 270 tablet 1  ? predniSONE (DELTASONE) 20 MG tablet Take 3 tablets (60 mg total) by mouth daily with breakfast. Take on day 2 through day 5 of each chemo cycle (4 days) 24 tablet 2  ? prochlorperazine (COMPAZINE) 10 MG tablet Take 1 tablet (10 mg total) by mouth every 6 (six) hours as needed for nausea. 60 tablet 1  ? ?No current facility-administered medications for this encounter.   ? ?BP 118/72   Pulse (!) 111   Wt 77.2 kg (170 lb 3.2 oz)   SpO2 98%   BMI 25.13 kg/m?  ?General: NAD ?Neck: No JVD, no thyromegaly or thyroid nodule.  ?Lungs: Dry crackles at bases.  ?CV: Nondisplaced PMI.  Heart tachy, regular S1/S2, no S3/S4, 1/6 SEM RUSB.  No peripheral edema.  No carotid bruit.  Normal pedal pulses.  ?Abdomen: Soft, nontender, no hepatosplenomegaly, no distention.  ?Skin: Intact without  lesions or rashes.  ?Neurologic: Alert and oriented x 3.  ?Psych: Normal affect. ?Extremities: No clubbing or cyanosis.  ?HEENT: Normal.  ? ?Assessment/Plan: ?1. Tachycardia: Patient has sinus tachycardia, cause uncertain.  Started after last chemotherapy infusion last Thursday and has been fairly persistent since that time.  With negative D dimer, I think that PE is unlikely.  No signs so far of infection and he is afebrile. Hgb is stable. He does not look dehydrated and reports eating/drinking normally.  Given doxorubicin use, I am concerned about possible cardiotoxicity.   ?- I will arrange for echo to be done this week.  ?- Check TSH.  ?2. Exertional dyspnea: Patient has had sinus tachycardia (see above) and exertional dyspnea, NYHA class III symptoms.  Dyspnea is worse than his usual baseline dyspnea from IPF.  CXR showed IPF changes.  BNP was mildly elevated at 137, but given advanced age, this may not be much beyond his baseline.  Troponin negative.  D dimer normal, making PE unlikely.  He does not look volume overloaded or dehydrated on exam.  ?- As above, given doxorubicin use, will arrange for echo this week.  ?- With enlarged PA suggestive of pulmonary hypertension in the setting of IPF, I think RHC to assess for PH-ILD (and also to assess filling pressures with dyspnea) would be in order.  With PH-ILD, patient would likely be a candidate for Tyvaso.  However, progressive pulmonary hypertension from IPF would not explain his acute symptoms.  I will arrange for RHC if LV EF is normal on echo.  If  LV EF is depressed, would aim for coronary angiography and RHC.  ?3. IPF: Long-standing, has been on Esbriet.  Acute worsening of dyspnea is unlikely to be related to IPF.  ?4. CAD: Coronary calcification noted.  If EF

## 2021-10-29 NOTE — Telephone Encounter (Signed)
Orders placed for the referral to cardiology and also for the echo. Patient is already scheduled for appt with Dr. Aundra Dubin 4/26 and I documented that in the order. Nothing further needed. ?

## 2021-10-29 NOTE — Telephone Encounter (Addendum)
Per Dr. Aundra Dubin, needs blood cultures to rule out endocarditis. ? ?----- Message from Larey Dresser, MD sent at 10/29/2021  2:33 PM EDT ----- ?normal ?

## 2021-10-29 NOTE — Patient Instructions (Addendum)
Labs done today. We will contact you only if your labs are abnormal. ? ?No medication changes were made. Please continue all current medications as prescribed. ? ?Your physician recommends that you schedule a follow-up appointment in: 3 weeks ? ?If you have any questions or concerns before your next appointment please send [place] a message through [place] or call our office at [phone].   ? ?TO LEAVE A MESSAGE FOR THE NURSE SELECT OPTION 2, PLEASE LEAVE A MESSAGE INCLUDING: ?YOUR NAME ?DATE OF BIRTH ?CALL BACK NUMBER ?REASON FOR CALL**this is important as we prioritize the call backs ? ?YOU WILL RECEIVE A CALL BACK THE SAME DAY AS LONG AS YOU CALL BEFORE 4:00 PM ? ? ?Do the following things EVERYDAY: ?Weigh yourself in the morning before breakfast. Write it down and keep it in a log. ?Take your medicines as prescribed ?Eat low salt foods--Limit salt (sodium) to 2000 mg per day.  ?Stay as active as you can everyday ?Limit all fluids for the day to less than 2 liters ? ? ?At the [facility], you and your health needs are our priority. As part of our continuing mission to provide you with exceptional heart care, we have created designated Provider Care Teams. These Care Teams include your primary Cardiologist (physician) and Advanced Practice Providers (APPs- Physician Assistants and Nurse Practitioners) who all work together to provide you with the care you need, when you need it.  ? ?You may see any of the following providers on your designated Care Team at your next follow up: ?Dr Glori Bickers ?Dr Loralie Champagne ?Darrick Grinder, NP ?Lyda Jester, PA ?Audry Riles, PharmD ? ? ?Please be sure to bring in all your medications bottles to every appointment. ? ?You are scheduled for a Cardiac Catheterization on Tuesday, May 2 with Dr. Loralie Champagne. ? ?1. Please arrive at the Main Entrance A at [facility] [place], [place] [address] at 5:30 AM (This time is two hours before your  procedure to ensure your preparation). Free valet parking service is available.  ? ?Special note: Every effort is made to have your procedure done on time. Please understand that emergencies sometimes delay scheduled procedures. ? ?2. Diet: Do not eat solid foods after midnight.  You may have clear liquids until 5 AM upon the day of the procedure. ? ?3. Medication instructions in preparation for your procedure: ? ?On the morning of your procedure, take Aspirin and any morning medicines NOT listed above.  You may use sips of water. ? ?4. Plan to go home the same day, you will only stay overnight if medically necessary. ?5. You MUST have a responsible adult to drive you home. ?6. An adult MUST be with you the first 24 hours after you arrive home. ?7. Bring a current list of your medications, and the last time and date medication taken. ?8. Bring ID and current insurance cards. ?9.Please wear clothes that are easy to get on and off and wear slip-on shoes. ? ?Thank you for allowing [place] to care for you! ?  -- [facility]\ ?

## 2021-10-29 NOTE — Telephone Encounter (Addendum)
Pt aware, agreeable, and verbalized understanding ? ? ?----- Message from Larey Dresser, MD sent at 10/29/2021  2:35 PM EDT ----- ?Normal LV EF and RV function.  Cannot estimate PA pressure from this study.  Probably mitral annular calcification, but cannot completely rule out vegetation.  I reviewed prior echo, and looks slightly different but may be technique.  Would recommend that he come by for 1 more set of blood cultures to ensure negative. He will need RHC next week, with normal EF do not need coronaries shot.  ?

## 2021-10-30 ENCOUNTER — Ambulatory Visit (HOSPITAL_COMMUNITY)
Admission: RE | Admit: 2021-10-30 | Discharge: 2021-10-30 | Disposition: A | Discharge status: Home / Self Care | Payer: PPO | Source: Ambulatory Visit | Attending: Cardiology | Admitting: Cardiology

## 2021-10-30 DIAGNOSIS — I5022 Chronic systolic (congestive) heart failure: Secondary | ICD-10-CM | POA: Diagnosis not present

## 2021-10-30 LAB — PATHOLOGIST SMEAR REVIEW

## 2021-10-31 ENCOUNTER — Telehealth: Payer: Self-pay | Admitting: Internal Medicine

## 2021-10-31 NOTE — Telephone Encounter (Signed)
Mel Almond ? ?I saw Kenneth [name] acutely this week for tachycardia.he told me echo normal ? ?Plan ? - can yu please move up his PFT/ov to see me 30 min in 2-4 weeks from 10/31/2021 ? ?- ok to retain 7/7.23 visit for now ?

## 2021-11-01 LAB — CULTURE, BLOOD (SINGLE)
Culture: NO GROWTH
Special Requests: ADEQUATE

## 2021-11-05 ENCOUNTER — Encounter: Payer: Self-pay | Admitting: *Deleted

## 2021-11-05 LAB — PROCALCITONIN: Procalcitonin: <0.1 ng/mL (ref <0.10)

## 2021-11-05 LAB — D-DIMER, QUANTITATIVE: D-Dimer, Quant: 0.39 mcg/mL FEU (ref <0.50)

## 2021-11-06 LAB — CULTURE, BLOOD (ROUTINE X 2): Culture: NO GROWTH

## 2021-11-07 NOTE — Telephone Encounter (Signed)
Called and spoke with pt letting him know the results of the ONO and he verbalized understanding. Pt said he wanted to hold off on O2 at this time. Nothing further needed. ?

## 2021-11-07 NOTE — Telephone Encounter (Signed)
ONO study /27/23 - pulse ox < 88% for 14 min and 14 sec ? ?Plan ? - technicially qualifies for night o2 but he is moving and is actually feeling better now ?- his choice - can use 1L [place] at night or weait and we can retest in future ?

## 2021-11-10 ENCOUNTER — Ambulatory Visit: Payer: PPO

## 2021-11-11 ENCOUNTER — Telehealth (HOSPITAL_COMMUNITY): Payer: Self-pay | Admitting: *Deleted

## 2021-11-11 ENCOUNTER — Ambulatory Visit: Payer: PPO | Admitting: Internal Medicine

## 2021-11-11 NOTE — Telephone Encounter (Signed)
Pt called to cancel heart cath. Pt said he feel better and pcp said he can wait to have cath. Pt will call back if decides to r/s.  ?

## 2021-11-19 ENCOUNTER — Encounter (HOSPITAL_COMMUNITY): Admission: RE | Payer: Self-pay | Source: Home / Self Care

## 2021-11-19 ENCOUNTER — Ambulatory Visit (HOSPITAL_COMMUNITY): Admission: RE | Admit: 2021-11-19 | Payer: PPO | Source: Home / Self Care | Admitting: Cardiology

## 2021-11-19 SURGERY — RIGHT HEART CATH
Anesthesia: LOCAL

## 2021-11-20 ENCOUNTER — Inpatient Hospital Stay: Payer: PPO

## 2021-11-20 ENCOUNTER — Inpatient Hospital Stay: Payer: PPO | Attending: Oncology | Admitting: Oncology

## 2021-11-20 ENCOUNTER — Ambulatory Visit (INDEPENDENT_AMBULATORY_CARE_PROVIDER_SITE_OTHER): Payer: PPO

## 2021-11-20 ENCOUNTER — Other Ambulatory Visit: Payer: Self-pay | Admitting: *Deleted

## 2021-11-20 VITALS — BP 129/77 | HR 100 | Temp 98.1°F | Resp 18 | Ht 69.0 in | Wt 168.8 lb

## 2021-11-20 DIAGNOSIS — C61 Malignant neoplasm of prostate: Secondary | ICD-10-CM | POA: Diagnosis not present

## 2021-11-20 DIAGNOSIS — C8288 Other types of follicular lymphoma, lymph nodes of multiple sites: Secondary | ICD-10-CM | POA: Insufficient documentation

## 2021-11-20 DIAGNOSIS — M25473 Effusion, unspecified ankle: Secondary | ICD-10-CM | POA: Diagnosis not present

## 2021-11-20 DIAGNOSIS — Z79899 Other long term (current) drug therapy: Secondary | ICD-10-CM | POA: Diagnosis not present

## 2021-11-20 DIAGNOSIS — K648 Other hemorrhoids: Secondary | ICD-10-CM | POA: Diagnosis not present

## 2021-11-20 DIAGNOSIS — C8330 Diffuse large B-cell lymphoma, unspecified site: Secondary | ICD-10-CM

## 2021-11-20 DIAGNOSIS — R0609 Other forms of dyspnea: Secondary | ICD-10-CM | POA: Insufficient documentation

## 2021-11-20 DIAGNOSIS — R600 Localized edema: Secondary | ICD-10-CM

## 2021-11-20 DIAGNOSIS — K589 Irritable bowel syndrome without diarrhea: Secondary | ICD-10-CM | POA: Diagnosis not present

## 2021-11-20 DIAGNOSIS — R000 Tachycardia, unspecified: Secondary | ICD-10-CM | POA: Insufficient documentation

## 2021-11-20 LAB — CMP (CANCER CENTER ONLY)
ALT: 14 U/L (ref 0–44)
AST: 20 U/L (ref 15–41)
Albumin: 4.2 g/dL (ref 3.5–5.0)
Alkaline Phosphatase: 45 U/L (ref 38–126)
Anion gap: 8 (ref 5–15)
BUN: 13 mg/dL (ref 8–23)
CO2: 28 mmol/L (ref 22–32)
Calcium: 9.2 mg/dL (ref 8.9–10.3)
Chloride: 101 mmol/L (ref 98–111)
Creatinine: 0.64 mg/dL (ref 0.61–1.24)
GFR, Estimated: >60 mL/min (ref >60)
Glucose, Bld: 107 mg/dL — ABNORMAL HIGH (ref 70–99)
Potassium: 4.4 mmol/L (ref 3.5–5.1)
Sodium: 137 mmol/L (ref 135–145)
Total Bilirubin: 0.5 mg/dL (ref 0.3–1.2)
Total Protein: 6.9 g/dL (ref 6.5–8.1)

## 2021-11-20 LAB — CBC WITH DIFFERENTIAL (CANCER CENTER ONLY)
Abs Immature Granulocytes: 0.02 10*3/uL (ref 0.00–0.07)
Basophils Absolute: 0 10*3/uL (ref 0.0–0.1)
Basophils Relative: 0 %
Eosinophils Absolute: 0.2 10*3/uL (ref 0.0–0.5)
Eosinophils Relative: 5 %
HCT: 32.9 % — ABNORMAL LOW (ref 39.0–52.0)
Hemoglobin: 11.3 g/dL — ABNORMAL LOW (ref 13.0–17.0)
Immature Granulocytes: 0 %
Lymphocytes Relative: 13 %
Lymphs Abs: 0.7 10*3/uL (ref 0.7–4.0)
MCH: 32.4 pg (ref 26.0–34.0)
MCHC: 34.3 g/dL (ref 30.0–36.0)
MCV: 94.3 fL (ref 80.0–100.0)
Monocytes Absolute: 0.7 10*3/uL (ref 0.1–1.0)
Monocytes Relative: 14 %
Neutro Abs: 3.3 10*3/uL (ref 1.7–7.7)
Neutrophils Relative %: 68 %
Platelet Count: 207 10*3/uL (ref 150–400)
RBC: 3.49 MIL/uL — ABNORMAL LOW (ref 4.22–5.81)
RDW: 14.3 % (ref 11.5–15.5)
WBC Count: 4.9 10*3/uL (ref 4.0–10.5)
nRBC: 0 % (ref 0.0–0.2)

## 2021-11-20 LAB — LACTATE DEHYDROGENASE: LDH: 233 U/L — ABNORMAL HIGH (ref 98–192)

## 2021-11-20 NOTE — Progress Notes (Signed)
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  He received G-CSF on 10/24/2021. He presents emergency room 10/27/2021 with tachycardia.  There was no explanation for the tachycardia.  He was evaluated by cardiology and pulmonary medicine.  An echocardiogram found the LVEF at 55 to 60%.  He reports improvement in the tachycardia over the past few weeks.  His pulse is now running in the 80s at rest.  He is exercising.  He continues to have mild exertional dyspnea.  He recently moved to [facility].  He has noted lower leg and ankle swelling on the left greater than right.  This improves with support stockings.  Mild discomfort at the left lower leg.  Objective:  Vital signs in last 24 hours:  Blood pressure 129/77, pulse 100, temperature 98.1 F (36.7 C), temperature source Oral, resp. rate 18, height _0  (1.753 m), weight 168 lb 12.8 oz (76.6 kg), SpO2 95 %.    HEENT: No thrush or ulcers Lymphatics: No cervical, supraclavicular, axillary, or inguinal nodes Resp: Inspiratory rales at the right greater than left lower posterior chest, no respiratory distress Cardio: Regular rate and rhythm GI: No hepatosplenomegaly Vascular: Trace edema at the left lower leg and ankle, no erythema or tenderness in the left leg.   Lab Results:  Lab Results  Component Value Date   WBC 4.9 11/20/2021   HGB 11.3 (L) 11/20/2021   HCT 32.9 (L) 11/20/2021   MCV 94.3 11/20/2021   PLT 207 11/20/2021   NEUTROABS 3.3 11/20/2021    CMP  Lab Results  Component Value Date   NA 131 (L) 10/28/2021   K 4.1 10/28/2021   CL 96 10/28/2021   CO2 28 10/28/2021   GLUCOSE 108 (H) 10/28/2021   BUN 14 10/28/2021   CREATININE 0.59 10/28/2021   CALCIUM 8.6 10/28/2021   PROT 6.3 10/28/2021   ALBUMIN 4.0 10/28/2021   AST 24 10/28/2021   ALT 53 10/28/2021   ALKPHOS 87 10/28/2021   BILITOT 0.4  10/28/2021   GFRNONAA >60 10/27/2021   GFRAA >60 06/01/2017    Medications: I have reviewed the patient's current medications.   Assessment/Plan: Non-Hodgkin's lymphoma CTs neck and chest 03/19/2021-cluster of left supraclavicular nodes measuring less than 1 cm, 12 mm right node next to the jugular vein, chest with slight interval progression of pulmonary fibrosis, new small sclerotic lesion in the right third rib concerning for metastasis, lucent lesion in T8 corresponding to abnormal uptake on a bone scan 05/21/2020 suspicious for malignancy, subpectoral nodes-subcentimeter in size but larger than 03/28/2019 FNA of left neck node 03/25/2021-atypical lymphoid proliferation Excisional biopsy of level 5 left neck node 04/07/2021-lymph node with marked autolysis and diffusely infiltrated by atypical lymphocytes, 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 Cycle 1 R-mini CHOP  07/10/2021 Cycle 2 R-mini CHOP 07/31/2021 Cycle 3 R-mini CHOP 08/21/2021 Cycle 4 R-mini CHOP 09/11/2021 PET 09/29/2021-near complete resolution of hypermetabolic lymph nodes, no remaining hypermetabolic disease, wall thickening and hypermetabolic activity at the transverse and sigmoid colon Cycle 5 R-mini CHOP 10/02/2021 Cycle 6 R-mini CHOP 10/23/2021, Udenyca 10/24/2021 Prostate cancer-T3 adenocarcinoma, Gleason 5+4, external beam XRT 09/12/2020 - 11/07/2020, maintained on androgen deprivation therapy 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  He is in clinical remission from non-Hodgkin's lymphoma.  He experienced rest/exertional tachycardia a few weeks ago.  This may have been related to the course of chemotherapy and steroids.  His heart rate appears to have returned to baseline.  There was no other explanation for the tachycardia. The persistent exertional dyspnea and tachycardia are likely multifactorial in the setting of underlying pulmonary disease and anemia.  Hopefully the anemia will improve over the next few months.  He has mild swelling in the left lower leg today.  He will be referred for a Doppler to rule out a DVT.  He will return for an office and lab visit in 3 months.  He will call in the interim for new symptoms.  Kenneth Coder, MD  11/20/2021  9:25 AM

## 2021-11-24 ENCOUNTER — Encounter (HOSPITAL_COMMUNITY): Payer: Self-pay | Admitting: Cardiology

## 2021-11-24 ENCOUNTER — Ambulatory Visit (HOSPITAL_COMMUNITY)
Admission: RE | Admit: 2021-11-24 | Discharge: 2021-11-24 | Disposition: A | Discharge status: Home / Self Care | Payer: PPO | Source: Ambulatory Visit | Attending: Cardiology | Admitting: Cardiology

## 2021-11-24 VITALS — BP 116/82 | HR 91 | Wt 170.2 lb

## 2021-11-24 DIAGNOSIS — Z7982 Long term (current) use of aspirin: Secondary | ICD-10-CM | POA: Insufficient documentation

## 2021-11-24 DIAGNOSIS — R000 Tachycardia, unspecified: Secondary | ICD-10-CM | POA: Diagnosis not present

## 2021-11-24 DIAGNOSIS — Z8572 Personal history of non-Hodgkin lymphomas: Secondary | ICD-10-CM | POA: Diagnosis not present

## 2021-11-24 DIAGNOSIS — I2510 Atherosclerotic heart disease of native coronary artery without angina pectoris: Secondary | ICD-10-CM | POA: Insufficient documentation

## 2021-11-24 DIAGNOSIS — I272 Pulmonary hypertension, unspecified: Secondary | ICD-10-CM | POA: Insufficient documentation

## 2021-11-24 DIAGNOSIS — Z8546 Personal history of malignant neoplasm of prostate: Secondary | ICD-10-CM | POA: Insufficient documentation

## 2021-11-24 DIAGNOSIS — J84112 Idiopathic pulmonary fibrosis: Secondary | ICD-10-CM | POA: Diagnosis not present

## 2021-11-24 DIAGNOSIS — R0609 Other forms of dyspnea: Secondary | ICD-10-CM | POA: Insufficient documentation

## 2021-11-24 NOTE — Progress Notes (Signed)
PCP: [name], MD Pulmonology: Dr. [name] Cardiology: Dr. Aundra Dubin  81 y.o. with history of pulmonary fibrosis, high grade B cell lymphoma, and prostate cancer was referred by Dr. [name] for evaluation of dyspnea and tachycardia.  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  He is on Esbriet.  He has not used home oxygen.  He has baseline exertional dyspnea.  In 5/22, he had XRT for prostate cancer.  In 9/22, he was diagnosed with high grade B cell lymphoma and finished his 6th cycle of mini-CHOP in 4/23.  Pre-op echo in 9/22 showed EF 65-70%, normal RV, unable to estimate PASP, mild AS mean gradient 12 mmHg.  Patient has been noted by CT to have coronary calcification.   After completing CHOP in 4/23, patient developed persistent sinus tachycardia.  He felt palpitations.  D dimer was normal.  No evidence for infection.  Echo in 4/23 showed EF 55-60%, normal RV, unable to assess PA pressure (no TR jet), and nodular calcification of the mitral valve.  Due to mitral valve abnormality, blood cultures were done which were negative.  Sinus tachycardia eventually subsided on its own.   He returns for followup of dyspnea.  Still with dyspnea walking up stairs, no dyspnea on flat ground.  No chest pain.  No orthopnea/PND.  HR now in 80s-90s.  He is exercising again.  He had some swelling in his ankles recently, now resolved.  Lower extremity venous dopplers showed no DVT.   Labs (4/23): D dimer negative, BNP 137, HS-TnI 13, K 4.1, creatinine 0.59, hgb 10.2, WBCs 12 Labs (5/23): K 4.4, creatinine 0.64, hgb 11.3  PMH: 1. IBS 2. 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  - CT chest in 9/22 with pulmonary fibrosis, enlarged PA, coronary artery calcium.  3. CAD: Coronary calcification on 9/22 CT chest.  - Cardiolite in 9/18 showed no ischemia or infarction.  4. Hyperlipidemia 5. High grade B cell lymphoma: Diagnosed in 9/22.  He received 6  cycles of mini-CHOP.  - Echo (9/22): Prechemo, EF 65-70%, normal RV, unable to estimate PASP, mild AS mean gradient 12 mmHg.  - Echo (4/23): EF 55-60%, normal RV, unable to assess PA pressure (no TR jet), and nodular calcification of the mitral valve.  6. Aortic stenosis: Mild on 9/22 echo.  7. 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  8. H/o L-spine compression fracture.   SH: Married, lives in [place], retired Stage manager, nonsmoker, rare ETOH.   Family History  Problem Relation Age of Onset   Heart failure Father 55   Bladder Cancer Mother    Breast cancer Mother    Colon cancer Neg Hx    Pancreatic cancer Neg Hx    Rectal cancer Neg Hx    Stomach cancer Neg Hx    Prostate cancer Neg Hx    ROS: All systems reviewed and negative except as per HPI.   Current Outpatient Medications  Medication Sig Dispense Refill   acetaminophen-codeine (TYLENOL #3) 300-30 MG per tablet Take 1 tablet by mouth every 8 (eight) hours as needed for moderate pain.     aspirin EC 81 MG tablet Take 81 mg by mouth 3 (three) times a week.     Bacillus Coagulans-Inulin (ALIGN PREBIOTIC-PROBIOTIC PO) Take by mouth.     carisoprodol (SOMA) 350 MG tablet Take 175 mg by mouth 3 (three) times daily as needed for muscle spasms.     Cholecalciferol (VITAMIN D-3) 1000 UNITS CAPS Take  2,000 Units by mouth every evening.     CO-ENZYME Q-10 PO Take 1 tablet by mouth every evening.      Cyanocobalamin (VITAMIN B12) 1000 MCG TBCR Take 1,000 mcg by mouth daily.     denosumab (PROLIA) 60 MG/ML SOSY injection Inject 60 mg into the skin every 6 (six) months. At [place]     diclofenac (VOLTAREN) 75 MG EC tablet Take 75 mg by mouth 2 (two) times daily as needed (pain.).  2   diclofenac sodium (VOLTAREN) 1 % GEL Apply 4 g topically 4 (four) times daily as needed (pain.).  12   dicyclomine (BENTYL) 10 MG capsule Take one by mouth every 6 hours as needed for abdominal cramping (Patient taking differently: Take 10 mg by mouth  every 6 (six) hours as needed (abdominal spasms.).) 30 capsule 1   diphenoxylate-atropine (LOMOTIL) 2.5-0.025 MG tablet Take 1 tablet by mouth 4 (four) times daily as needed for diarrhea or loose stools.     esomeprazole (NEXIUM) 40 MG capsule Take 40 mg by mouth every evening.      ezetimibe (ZETIA) 10 MG tablet Take 10 mg by mouth daily.     Fluticasone Propionate (XHANCE) 93 MCG/ACT EXHU 1 spray in each nostril     Glucosamine Sulfate 500 MG TABS Take 500 mg by mouth daily.     HYDROcodone-acetaminophen (NORCO/VICODIN) 5-325 MG tablet Take 1-2 tablets by mouth every 4 (four) hours as needed for moderate pain. 24 tablet 0   leuprolide, 6 Month, (ELIGARD) 45 MG injection Inject 45 mg into the skin every 6 (six) months. Per Dr. Tresa Moore office     loratadine (CLARITIN) 10 MG tablet Take 10 mg by mouth daily.     meclizine (ANTIVERT) 25 MG tablet Take 25 mg by mouth 3 (three) times daily as needed for dizziness.      methocarbamol (ROBAXIN) 500 MG tablet take 1 tab 4 times daily prn spasm     Multiple Vitamin (MULTIVITAMIN) tablet Take 1 tablet by mouth daily.     ondansetron (ZOFRAN) 8 MG tablet Take 1 tablet (8 mg total) by mouth every 8 (eight) hours as needed for nausea or vomiting. Start 72 hours after IV chemotherapy administration 30 tablet 1   oxybutynin (DITROPAN) 5 MG tablet Take 5 mg by mouth every 8 (eight) hours as needed.     Pirfenidone (ESBRIET) 801 MG TABS Take 1 tablet by mouth 3 (three) times daily with meals. 270 tablet 1   predniSONE (DELTASONE) 20 MG tablet Take 3 tablets (60 mg total) by mouth daily with breakfast. Take on day 2 through day 5 of each chemo cycle (4 days) 24 tablet 2   prochlorperazine (COMPAZINE) 10 MG tablet Take 1 tablet (10 mg total) by mouth every 6 (six) hours as needed for nausea. 60 tablet 1   No current facility-administered medications for this encounter.   BP 116/82   Pulse 91   Wt 77.2 kg (170 lb 3.2 oz)   SpO2 96%   BMI 25.13 kg/m  General:  NAD Neck: No JVD, no thyromegaly or thyroid nodule.  Lungs: Clear to auscultation bilaterally with normal respiratory effort. CV: Nondisplaced PMI.  Heart regular S1/S2, no S3/S4, 2/6 early SEM RUSB.  No peripheral edema.  No carotid bruit.  Normal pedal pulses.  Abdomen: Soft, nontender, no hepatosplenomegaly, no distention.  Skin: Intact without lesions or rashes.  Neurologic: Alert and oriented x 3.  Psych: Normal affect. Extremities: No clubbing or cyanosis.  HEENT: Normal.   Assessment/Plan: 1. Sinus tachycardia: Resolved.  TSH, D dimer normal.  Echo in 4/23 showed EF 55-60%, normal RV, unable to assess PA pressure (no TR jet), and nodular calcification of the mitral valve.  Suspect this may have been due to the cumulative effect of chemotherapy and steroids.   2. Exertional dyspnea: Suspect this is due to IPF.  Echo in 4/23 showed EF 55-60%, normal RV, unable to assess PA pressure (no TR jet), and nodular calcification of the mitral valve. He is not volume overloaded on exam.  - With enlarged PA suggestive of pulmonary hypertension in the setting of IPF, I think RHC to assess for PH-ILD (and also to assess filling pressures with dyspnea) would be reasonable.  Unable to assess PA pressure by echo with no TR jet measured.  With PH-ILD, patient would likely be a candidate for Tyvaso.  He is going to see Dr. [name] and will then decide whether he wants to do [employer].  3. IPF: Long-standing, has been on Esbriet.  4. CAD: Coronary calcification noted.   - Continue ASA 81.  - Continue Zetia  Followup 1 year.  He or Dr. [name] will contact me if they want RHC done.   Loralie Champagne 11/24/2021

## 2021-11-24 NOTE — Patient Instructions (Signed)
Than you for your visit today.  Your physician recommends that you schedule a follow-up appointment in: 1 year (May 2024) ** please call the office in March 2024 to arrange your follow up **   If you have any questions or concerns before your next appointment please send [place] a message through [place] or call our office at [phone].    TO LEAVE A MESSAGE FOR THE NURSE SELECT OPTION 2, PLEASE LEAVE A MESSAGE INCLUDING: YOUR NAME DATE OF BIRTH CALL BACK NUMBER REASON FOR CALL**this is important as we prioritize the call backs  YOU WILL RECEIVE A CALL BACK THE SAME DAY AS LONG AS YOU CALL BEFORE 4:00 PM  At the [facility], you and your health needs are our priority. As part of our continuing mission to provide you with exceptional heart care, we have created designated Provider Care Teams. These Care Teams include your primary Cardiologist (physician) and Advanced Practice Providers (APPs- Physician Assistants and Nurse Practitioners) who all work together to provide you with the care you need, when you need it.   You may see any of the following providers on your designated Care Team at your next follow up: Dr Glori Bickers Dr Haynes Kerns, NP Lyda Jester, [place] [facility] [place], [place] Audry Riles, PharmD   Please be sure to bring in all your medications bottles to every appointment.

## 2021-12-11 ENCOUNTER — Other Ambulatory Visit: Payer: Self-pay

## 2021-12-11 ENCOUNTER — Telehealth: Payer: Self-pay

## 2021-12-11 MED ORDER — OMEPRAZOLE 40 MG PO CPDR: 40.0000 mg | DELAYED_RELEASE_CAPSULE | Freq: Every day | ORAL | 11 refills | Status: DC

## 2021-12-11 NOTE — Telephone Encounter (Signed)
Spoke with pt and prescription sent to [facility] at [address], pt aware.

## 2021-12-11 NOTE — Progress Notes (Signed)
Marland Kitchen

## 2021-12-11 NOTE — Telephone Encounter (Signed)
-----  Message from Irene Shipper, MD sent at 12/11/2021 11:26 AM EDT ----- Regarding: Prescription Vaughan Basta, I saw Dr. Annamaria Boots this morning in the [facility] when I was caring for his wife. He needs a prescription for omeprazole 40 mg daily; #30; 11 refills.  Diagnosis is GERD. He tells me that he has a new pharmacy, as they moved to [facility]. Please contact Dr. Annamaria Boots regarding the proper pharmacy and call in his prescription. Thank you Dr. Henrene Pastor

## 2021-12-15 DIAGNOSIS — Z8546 Personal history of malignant neoplasm of prostate: Secondary | ICD-10-CM | POA: Diagnosis not present

## 2021-12-15 DIAGNOSIS — K219 Gastro-esophageal reflux disease without esophagitis: Secondary | ICD-10-CM | POA: Diagnosis not present

## 2021-12-15 DIAGNOSIS — K449 Diaphragmatic hernia without obstruction or gangrene: Secondary | ICD-10-CM | POA: Diagnosis not present

## 2021-12-15 DIAGNOSIS — J84112 Idiopathic pulmonary fibrosis: Secondary | ICD-10-CM | POA: Diagnosis not present

## 2021-12-15 DIAGNOSIS — K589 Irritable bowel syndrome without diarrhea: Secondary | ICD-10-CM | POA: Diagnosis not present

## 2021-12-15 DIAGNOSIS — Z8572 Personal history of non-Hodgkin lymphomas: Secondary | ICD-10-CM | POA: Diagnosis not present

## 2021-12-15 DIAGNOSIS — Z923 Personal history of irradiation: Secondary | ICD-10-CM | POA: Diagnosis not present

## 2022-01-09 ENCOUNTER — Telehealth: Payer: Self-pay | Admitting: Internal Medicine

## 2022-01-09 ENCOUNTER — Encounter: Payer: Self-pay | Admitting: Internal Medicine

## 2022-01-09 ENCOUNTER — Encounter: Payer: PPO | Admitting: Internal Medicine

## 2022-01-09 ENCOUNTER — Ambulatory Visit (INDEPENDENT_AMBULATORY_CARE_PROVIDER_SITE_OTHER): Payer: PPO | Admitting: Internal Medicine

## 2022-01-09 VITALS — BP 116/70 | HR 92 | Temp 97.6°F | Ht 69.0 in | Wt 168.0 lb

## 2022-01-09 DIAGNOSIS — J84112 Idiopathic pulmonary fibrosis: Secondary | ICD-10-CM

## 2022-01-09 DIAGNOSIS — H6982 Other specified disorders of Eustachian tube, left ear: Secondary | ICD-10-CM

## 2022-01-09 DIAGNOSIS — Z7184 Encounter for health counseling related to travel: Secondary | ICD-10-CM

## 2022-01-09 DIAGNOSIS — R634 Abnormal weight loss: Secondary | ICD-10-CM | POA: Diagnosis not present

## 2022-01-09 DIAGNOSIS — Z5181 Encounter for therapeutic drug level monitoring: Secondary | ICD-10-CM

## 2022-01-09 DIAGNOSIS — Z006 Encounter for examination for normal comparison and control in clinical research program: Secondary | ICD-10-CM

## 2022-01-09 DIAGNOSIS — R0981 Nasal congestion: Secondary | ICD-10-CM

## 2022-01-09 DIAGNOSIS — C61 Malignant neoplasm of prostate: Secondary | ICD-10-CM | POA: Diagnosis not present

## 2022-01-09 LAB — PULMONARY FUNCTION TEST
DL/VA % pred: 91 %
DL/VA: 3.59 ml/min/mmHg/L
DLCO cor % pred: 53 %
DLCO cor: 12.55 ml/min/mmHg
DLCO unc % pred: 53 %
DLCO unc: 12.55 ml/min/mmHg
FEF 25-75 Pre: 3.11 L/sec
FEF2575-%Pred-Pre: 165 %
FEV1-%Pred-Pre: 78 %
FEV1-Pre: 2.16 L
FEV1FVC-%Pred-Pre: 121 %
FEV6-%Pred-Pre: 69 %
FEV6-Pre: 2.49 L
FEV6FVC-%Pred-Pre: 107 %
FVC-%Pred-Pre: 64 %
FVC-Pre: 2.49 L
Pre FEV1/FVC ratio: 87 %
Pre FEV6/FVC Ratio: 100 %

## 2022-01-09 LAB — CBC WITH DIFFERENTIAL/PLATELET
Basophils Absolute: 0 10*3/uL (ref 0.0–0.1)
Basophils Relative: 0.2 % (ref 0.0–3.0)
Eosinophils Absolute: 0.2 10*3/uL (ref 0.0–0.7)
Eosinophils Relative: 3.2 % (ref 0.0–5.0)
HCT: 35.6 % — ABNORMAL LOW (ref 39.0–52.0)
Hemoglobin: 12.2 g/dL — ABNORMAL LOW (ref 13.0–17.0)
Lymphocytes Relative: 8.1 % — ABNORMAL LOW (ref 12.0–46.0)
Lymphs Abs: 0.5 10*3/uL — ABNORMAL LOW (ref 0.7–4.0)
MCHC: 34.3 g/dL (ref 30.0–36.0)
MCV: 94.6 fl (ref 78.0–100.0)
Monocytes Absolute: 0.7 10*3/uL (ref 0.1–1.0)
Monocytes Relative: 11.5 % (ref 3.0–12.0)
Neutro Abs: 4.8 10*3/uL (ref 1.4–7.7)
Neutrophils Relative %: 77 % (ref 43.0–77.0)
Platelets: 209 10*3/uL (ref 150.0–400.0)
RBC: 3.76 Mil/uL — ABNORMAL LOW (ref 4.22–5.81)
RDW: 13.4 % (ref 11.5–15.5)
WBC: 6.2 10*3/uL (ref 4.0–10.5)

## 2022-01-09 LAB — BASIC METABOLIC PANEL
BUN: 13 mg/dL (ref 6–23)
CO2: 28 mEq/L (ref 19–32)
Calcium: 9 mg/dL (ref 8.4–10.5)
Chloride: 99 mEq/L (ref 96–112)
Creatinine, Ser: 0.66 mg/dL (ref 0.40–1.50)
GFR: 88.78 mL/min (ref >60.00)
Glucose, Bld: 100 mg/dL — ABNORMAL HIGH (ref 70–99)
Potassium: 4.4 mEq/L (ref 3.5–5.1)
Sodium: 133 mEq/L — ABNORMAL LOW (ref 135–145)

## 2022-01-09 LAB — HEPATIC FUNCTION PANEL
ALT: 18 U/L (ref 0–53)
AST: 22 U/L (ref 0–37)
Albumin: 4.4 g/dL (ref 3.5–5.2)
Alkaline Phosphatase: 43 U/L (ref 39–117)
Bilirubin, Direct: 0.1 mg/dL (ref 0.0–0.3)
Total Bilirubin: 0.4 mg/dL (ref 0.2–1.2)
Total Protein: 7 g/dL (ref 6.0–8.3)

## 2022-01-09 LAB — BRAIN NATRIURETIC PEPTIDE: Pro B Natriuretic peptide (BNP): 32 pg/mL (ref 0.0–100.0)

## 2022-01-09 MED ORDER — PREDNISONE 10 MG PO TABS
ORAL_TABLET | ORAL | 0 refills | Status: AC
Start: 2022-01-09 — End: 2022-01-14

## 2022-01-09 NOTE — Patient Instructions (Signed)
Spirometry/DLCO performed today.

## 2022-01-09 NOTE — Telephone Encounter (Signed)
Called and spoke with pt letting him know the results of bloodwork and he verbalized understanding. Nothing further needed.

## 2022-01-09 NOTE — Progress Notes (Signed)
Spirometry/DLCO performed today.

## 2022-01-09 NOTE — Telephone Encounter (Signed)
  Mild anemia and hyponaermia continue. Otherwise labs normal. The walk tst today was reassuring  as well.  Pls let Dr Annamaria Boots know   LABS    PULMONARY No results for input(s): "PHART", "PCO2ART", "PO2ART", "HCO3", "TCO2", "O2SAT" in the last 168 hours.  Invalid input(s): "PCO2", "PO2"  CBC Recent Labs  Lab 01/09/22 1108  HGB 12.2*  HCT 35.6*  WBC 6.2  PLT 209.0    COAGULATION No results for input(s): "INR" in the last 168 hours.  CARDIAC  No results for input(s): "TROPONINI" in the last 168 hours. Recent Labs  Lab 01/09/22 1108  PROBNP 32.0     CHEMISTRY Recent Labs  Lab 01/09/22 1108  NA 133*  K 4.4  CL 99  CO2 28  GLUCOSE 100*  BUN 13  CREATININE 0.66  CALCIUM 9.0   Estimated Creatinine Clearance: 73.6 mL/min (by C-G formula based on SCr of 0.66 mg/dL).   LIVER Recent Labs  Lab 01/09/22 1108  AST 22  ALT 18  ALKPHOS 43  BILITOT 0.4  PROT 7.0  ALBUMIN 4.4     INFECTIOUS No results for input(s): "LATICACIDVEN", "PROCALCITON" in the last 168 hours.   ENDOCRINE CBG (last 3)  No results for input(s): "GLUCAP" in the last 72 hours.       IMAGING x48h  - image(s) personally visualized  -   highlighted in bold No results found.

## 2022-01-09 NOTE — Progress Notes (Signed)
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 He tells me that this summer 2018*noticing insidious onset of shortness of breath particularly with walking stairs in the house or [name]. This was not that in the previous years. The summer 2018 he was in [place] but did not notice much shortness of breath. Otherwise he is able to do his activities of daily living and played golf with [name] car. He is more bothered by his back pain and sciatica as [name] result of previous back surgery. Shortness of breath is extremely mild. He says he became more aware of it after the radiologic investigations documented below. He had [name] chest x-ray that suggested interstitial findings and therefore he underwent [name] high-resolution CT scan of the chest that is described below. I personally visualized this high resolution CT chest and to me it shows bilateral bibasal subpleural reticulation that also extends to the upper lobes without any zonal predominance. There is no obvious honeycombing but there is traction bronchiectasis. There no mediastinal adenopathy. Therefore he is been referred here. In terms of exposure history other than radiation exposure he has not been on any pulmonary toxic drugs or any mold exposure or asbestos exposure. Does have GERD and is on PPI and is well ocntrolled   Does have spring and perennial allergies - sneezes easy. Allergy test with [facility] negative some years ago  [employer] chest physicians interstitial lung disease questionnaire: He says that he does not cough. He is only trouble with dyspnea with strenuous exercise and it started 4 months ago. Past medical history significant for acid reflux. He has never smoked any tobacco or street drugs. Does not have any family history of lung disease. At home  he does not have any humidifier sound birds heart the water damage or mold. 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 He has standard radiation exposure while at work. The standard radiation for [name] radiologist. He does not take any pulmonary toxic drugs. House is 80 years old and he worries about mold. He sneezes if air is forced   Pulmonary function test shows isolated reduction in diffusion capacity to 62%. Walking desaturation test underneath her feet 3 laps on room air: Resting pulse ox 96%. Final pulse ox 95%. Heart rate resting was 92/m and went up to 109 minute.   Results for Kenneth, [name] (MRN [id number]) as of 03/23/2017 10:04  Ref. Range 03/04/2017 09:13  FVC-Pre Latest Units: L 3.59  FVC-%Pred-Pre Latest Units: % 86  Results for Kenneth, [name] (MRN [id number]) as of 03/23/2017 10:04  Ref. Range 03/04/2017 09:13  DLCO cor Latest Units: ml/min/mmHg 20.06  DLCO cor % pred Latest Units: % 63    Lungs/Pleura: There is subpleural reticulation with scattered ground-glass and traction bronchiolectasis, without [name] definite zonal predominance. No definitive honeycombing. Findings appear mildly progressive when radiographs dating back to 12/28/2008 are reviewed. Probable 5 mm subpleural lymph node along the right major fissure. No pleural fluid. Airway is unremarkable.      IMPRESSION: 1. Pulmonary parenchymal findings of interstitial lung disease which may be due to fibrotic nonspecific interstitial pneumonitis or, given slight progression over time, usual interstitial pneumonitis. 2. Aortic atherosclerosis (ICD10-170.0). Coronary artery calcification. 3. Aortic aneurysm NOS (ICD10-I71.9). Small ascending  aortic aneurysm with aortic valvular calcification. Recommend annual imaging followup by CTA or MRA. This recommendation follows 2010 ACCF/AHA/AATS/ACR/ASA/SCA/SCAI/SIR/STS/SVM Guidelines for the Diagnosis and Management of Patients with Thoracic Aortic  Disease. Circulation. 2010; 121: X528-U132. 4. Prominence of the right and left main pulmonary arteries can be seen with pulmonary arterial hypertension.     Electronically Signed   By: [name]   On: 02/17/2017 13:41      OV 03/23/2017  Chief Complaint  Patient presents with   Follow-up    SOB on exertion. Other than that he has been doing about the same. Denies any cough or CP.   Here to discuss results. Wife Kenneth [name] here. 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 In inteirm no new symptoms.  Autoimmune profile negative. They raise possibility of 2nd opinion.  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 Progression is based on CXR comparison since 2010; there is no CT and is only suspicion of progression  Results for Kenneth, [name] (MRN [id number]) as of 03/23/2017 10:04  Ref. Range 12/08/2011 16:44 03/04/2017 13:47  Anit Nuclear Antibody(ANA) Latest Ref Range: NEGATIVE   NEG  Angiotensin-Converting Enzyme Latest Ref Range: 9 - 67 U/L  31  Cyclic Citrullin Peptide Ab Latest Units: Units  <16  ds DNA Ab Latest Units: IU/mL  <1  ENA RNP Ab Latest Ref Range: 0.0 - 0.9 AI  0.2  Endomysial Screen Latest Ref Range: NEGATIVE  NEGATIVE   Deamidated Gliadin Abs, IgG Latest Ref Range: <20 U/mL 4.1   Gliadin IgA Latest Ref Range: <20 U/mL 1.8   RA Latex Turbid. Latest Ref Range: <14 IU/mL  <14  Tissue Transglut Ab Latest Ref Range: <20 U/mL 3.9   Tissue Transglutaminase Ab, IgA Latest Ref Range: <20 U/mL 2.2   IgE (Immunoglobulin E), Serum Latest Ref Range: <115 kU/L  31  IgA Latest Ref Range: 68 - 379 mg/dL 132   SSA (Ro) (ENA) Antibody, IgG Latest Ref Range: <1.0 NEG AI   <1.0 NEG  SSB (La) (ENA) Antibody, IgG Latest Ref Range: <1.0 NEG AI   <1.0 NEG    OV 05/06/2017  Chief Complaint  Patient presents with   Follow-up    Follow up today after beginning Esbriet. Pt has no c/o SOB, cough,  or CP. Has had some mild nausea from the [place] but other than that, he has been doing good on it.   ollow-up idiopathic pulmonary fibrosis mild severity  He is here to follow-up He is on the third week of his bed at 3 pills 3 times [name] day max dose. So far is tolerating it fine other than mild nausea. He does take after meals. He plans to meet with [facility] coordinator for medication support. He did visit [place] this past weekend and did have vomiting especially after consuming few etoh drinks at [name] [employer]. He does apply sunscreen [his daughter is [name] Paediatric nurse in [place]. He still complains about the fact that and is 80 year old home when the air conditioner comes on and they air blows out of the duct system he does sneeze 10 times and his feet does feel cold. She plans to put hepa filters that are high and and also get the duct cleaning. He says he did have allergy evaluation and found few years ago and it was negative. Recently he has increased his rpinrole and alsogot an injection for his  back and he does feel better   OV 06/22/2017  Chief Complaint  Patient presents with   Follow-up    Pt still taking Esbriet and has been doing good except has been having issues with nausea x3 days. PFT done today. Still becomes SOB with exertion. Denies any complaints of cough or CP.   IPF followup  Routine followup. Now on esbriet 3 pills tid since end oct 2018. Having new nausea - moderate, x 3 days. Might be chills. But no associated diarrhea or other side effects. Spacing esbriet 4h only. Also has complaitns about high co pay. Unable to find charity. Doing exrcise with trainer - feeels that is better and more vigorous than rehab. Gets tachy with eercise but says he does not desaturate. Had PFT - stable. Lot of questions about disease    Walking desaturation test on 06/22/2017 185 feet x 3 laps on ROOM AIR:  did NOT desaturate. Rest pulse ox was 100%, final pulse ox was 99%. HR response  84/min at rest to 97/min at peak exertion. Patient Kenneth [name]  Did not Desaturate < 88% . Kenneth [name] did not  Desaturated </= 3% points. Marylyn Ishihara [name] Pfost yes did get tachyardic   OV 09/20/2017  Chief Complaint  Patient presents with   Follow-up    PFT done today.  Pt states he has been doing good. Pt still taking Esbriet and states he is doing good on it.   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  Joen Laura for IPF follow-up.  Last visit December 2018.  Since then he has been compliant fully with his Pirfenidone (Esbriet).  His nausea resolved with Ginger intake.  However he is having 3-5 pound weight loss and also fatigue towards the end of the day and also low appetite.  He is exercising vigorously 5 times [name] week and he is wondering if the fatigue could be because of the heavy exercise.  He is not interested in lung transplantation.  He is open to research participation in interventional trial.  Currently he is on the IPF registry program.  Overall he is well.  He did go to [place] to visit his son and he did not wear sunscreen and he did pick up [name] stage I skin burn with erythema that was more than usual.  But this resolved.  I did remind him about his obligation to wear sunscreen at all times with Pirfenidone (Esbriet).  He is interested in interventional research trials.  His pulmonary function test to me on an average is stable even though there is decline in FVC that seems to be an increase in DLCO so that is variability.  His walking desaturation test is around the same. He is interested in rolling over to the larger capsule of the Pirfenidone (Esbriet) so we will have to take it only 3 times [name] day. ESberit is cposting him $500/month or more due to high AGI and this is frustrating for him   Walking desaturation test on 09/20/2017 185 feet x 3 laps on ROOM AIR:  did walk briskly all 3 laps with only mild dyspnea desaturate. Rest pulse ox was 98%, final pulse ox was 98%. HR response 96/min at rest to 107/min  at peak exertion. Patient Kenneth [name]  Did not Desaturate < 88% . Kenneth [name] did not  Desaturated </= 3% points. Guillermina [place]oh[place] yes did get tachyardic this appears similar to [name] few months ago   OV 12/07/2017  Chief Complaint  Patient presents with  Follow-up    Pt had pre Arlyce Harman and DLCO prior to OV today. Pt has increase of SOB with exertion more with walking up the hill.   Dr. Annamaria Boots presents for follow-up of his IPF to ILD clinic.  In terms of his IPF he feels stable.  In fact he tells me that if not for the incidental chest x-ray and [name] subsequent CT scan that picked up pulmonary fibrosis he even to this day will not know that he has IPF.  He only has [name] mild exertional dyspnea class I which she always believes in the past that it was because of aging.  At this point in time he is on Esbriet 1 big pill 3 times daily at full dose.  He is now applying sunscreen and has not had any further sunburns.  He is following an extremely intense exercise fitness regimen to improve his cardiac conditioning.  He believes his exertional heart rate and resting heart rate are much improved than before.  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  His main issue is from Pirfenidone [employer]).  He had some mild nausea that has now resolved with ginger capsules but he continues to have some amount of fatigue low appetite and some mild weight loss.  He says this is mild and all tolerable.  The fatigue happens after working [name] lot in the yard and he loves yard work.  He wants to know if he can continue to do yard work.  Recently he did have some sinus congestion and took some steroids and antibiotics for it and he feels better but he still has some sinus fullness.  He had spirometry and simple walking desaturation test and the profile is below and I believe these are stable.  In fact his exertional heart rate is improved compared to previous.  Today he also participated in the IPF registry program.  There is [name]  noninterventional trial.  He is interested in future interventional trials.   OV 03/08/2018  Chief Complaint  Patient presents with   Follow-up    PFT performed today. Pt states he has been doing well except he has been having problems with nausea and dizziness, and loss of appetite.   Kenneth [name] - presents for follow-up. For his IPF. He is on esbruet now. He was tolerating his Pirfenidone (Esbriet) quite well other than minor side effects till last visit. However in thesummer of 2019e's had increased nauseaand also weight loss. He has lost 6 pounds since prior visit. Overall he is lost 11 pounds since September 2018. We started the medication esbeit  anti-fibrotic for him in October 2018. His pants are getting looser.he is also having significant fatigue. Nevertheless he does not want to switch to the of anti-fibrotic ofev because of prior history of irritable bowel syndrome. He says that his baseline irritable bowel syndrome is worse than any side effects he is having with esbriet especially the fatigue.e is open to [name] transplant evaluation and consideration of research protocols. His spirometry shows stability with FVC but his DLCO might be declining.Marland Kitchen His simple walk test did not show anychange. He is not noticing any subjective changes in dyspnea on effort tolerance while working out although he does admit to increased dizziness particularly when playing golf. During this time recently has not monitored his pulse ox but in the past and never went below 92%.  Other issues: He had some myalgia despite change in his statin. He will address this with his  primary care. Also CT scan [name] year ago showed thoracic aortic dilatiion. Primary care has ordered [name] follow-up CT scan.This can be combined with [name] high risk CT scan. I I have reached out to  the thoracic radiology     OV 05/10/2018  Subjective:  Patient ID: Kenneth [name], male , DOB: 06-21-1941 , age 68 y.o. , MRN: [id number] , ADDRESS: [place] [id number]   05/10/2018 -   Chief Complaint  Patient presents with   Follow-up    review spiro with dlco.  c/o stable doe.  On 846m Esbriet TID, notes occ dizziness, loss of appetite.      HPI Kenneth GREULICH89y.o. -returns-year follow-up.  Diagnosis made towards the end of 2018.  He has been on Esbriet since then.  His main issues have been weight loss.  He lost 10 pounds of weight at the time of last visit.  At the last visit he weighed 174 pounds in September 2019.  Back in August 2018 he weighed 186 pounds.  All this weight loss happen after he started Pirfenidone (Esbriet).  However he is now stabilized and he continues to be 874 pounds.  He is able to do activities of daily living and exercise but when he does stairs he gets dyspneic.  Overall he feels stable in the last 1 year in terms of his effort tolerance.  His main thing is that he has no appetite.  He has been communicating with the Pirfenidone (Esbriet) supporting about how to manage his low appetite and occasional nausea.  We discussed the options about taking brief holidays or reduction in dose.  He is willing to try this.  He does take occasional ginger.  Of note he had [name] [facility] letter because he was out of network.  He tells me that his insurance company only advised that he would have [name] higher co-pay so is [name] little bit puzzled by this.  Since then he said conversation with his primary care physician and he wants to see Dr. DShana Chutewho is [name] son-in-law to his friend Dr. BLindwood Quaretired sPsychologist, sport and exercise  I know Dr. GLauris Chromanquite well from an IPF standpoint.  And therefore wholeheartedly supported the idea.  In terms of his lung function testing his FVC shows [name] decline although he is feeling the same.  On miniature walking test he seems [name] little more tachycardic but he says he feels the same.  He manages this heart rate with his exercise.  His  DLCO with some variability appears stable.  He had [name] CT angiogram for thoracic aortic aneurysm in September 2019 and this when compared to one year earlier has been reported as stable but the different resolutions.  I offered to do [name] high-resolution CT chest here but he wants to hold off until he saw Dr. GLauris Chroman  OV 08/23/2018  Subjective:  Patient ID: KWestley [name] male , DOB: 401/31/1942, age 81y.o. , MRN: [id number], [place] [id number]  08/23/2018 -   Chief Complaint  Patient presents with   Follow-up    PFT performed today. Pt states he has been doing well since last visit and denies any complaints.   IPF followup"   Diagnosis made towards the end of 2018.  He has been on Esbriet since then.   HPI KJOANTHAN [id number].o. -Dr.  Ronny Flurry presents for follow-up of his IPF.  He continues to feel good.  His main issue is tolerating the Esbriet which gives some fatigue.  Sometimes he has to postpone it.  In terms of his dyspnea he continues to exercise.  His dyspnea symptom score is really minimal.  His cough is nonexistent.  He was losing weight with Esbriet but now he is gained his weight back.  In the interim he did see Dr. Shana Chute June 23, 2018 at [facility].  He had [name] satisfactory visit.  He thought he was stable.  His next visit with Dr. Lauris Chroman will be in June 2020.  Moving forward he wants to alternate every 6 months between myself and Dr. Lauris Chroman at [facility] which will give him 3 months with 1 pulmonologist or the other.  He is beginning to get interested in research studies.  We did pulmonary function test on him today and is documented below.  The FVC appears [name] fluctuant but the DLCO definitely appears on [name] downward trend.  The new DLCO is on [name] global lung initiated [GL I] equation and therefore the percent predicted is higher.  But the absolute value showed downward trend line.  We went over this.  He definitely does not feel this subjectively.  His  last high-resolution CT scan of the chest was in August of 2018.  He is willing to have one at follow-up.  He wants to see me again in 6 months.  His simple walking desaturation test appears stable.  He is thinking of making [name] trip to [place] and wants to know if he should wear [name] mask.    OV 04/21/2019  Subjective:  Patient ID: Kenneth [name], male , DOB: Mar 16, 1941 , age 89 y.o. , MRN: [id number] , ADDRESS: [place] [id number]   04/21/2019 -   Chief Complaint  Patient presents with   Follow-up    Patient reports that his breathing is doing well at this time.    IPF followup. On esbriet  HPI Kenneth [name] 81 y.o. -presents for follow-up of his idiopathic pulmonary fibrosis.  Last visit was pre-pandemic in February 2018.  In the interim he did see Dr. Shana Chute at [facility] and deemed to be stable.  At this point in time he is reporting continued stability with the symptoms as seen by the score below.  However I did notice that he is lost lot of weight.  His BMI with his clothes on is 25 with [name] weight of 170 pounds.  He says his dry weight is actually around 162 pounds.  He feels that this is the stable weight in the last 6 months.  He says he is not bothered by it.  He says this is because of low appetite because of the and pirfenidone.  He feels the pirfenidone is benefiting him and he continues to be is to be stable.  In fact his pulmonary function test shows " improvement".  He said that he felt the test today was somewhat variable and is effort based on the technician.  He had [name] recent high-resolution CT chest that shows continued stability.  I personally visualized this film.  At this point in time he is happy taking the pirfenidone.  But he did agree if there is further weight loss into the 150s pounds then we could reassess  Had [name] lot of discussion about COVID-19 and about appropriate risk reduction measures which he is continuing to  do.  He did some read some  literature about COVID-19 and had questions about it. He in Vit D supplementation and is agreeable to get his levels checked following literature about protective effect of Vit D  Other than that he reports 2 episodes of dizziness and in one of those he nearly passed out.  He was wondering if some of the symptoms were related to ropinirole and therefore he has stopped it.  I reviewed the literature with him and the significant amount of dizziness and hypotension.  And also GI symptoms which can probably accentuated with pirfenidone.   OV 10/16/2019  Subjective:  Patient ID: Kenneth [name], male , DOB: 01-25-1941 , age 69 y.o. , MRN: [id number] , ADDRESS: [place] [id number]   10/16/2019 -   Chief Complaint  Patient presents with   Follow-up    PFT performed 3/31.  Pt states he is doing okay. States his breathing is about the same since last visit.     HPI TAYARI YANKEE 81 y.o.  -follow-up IPF on pirfenidone    -ROS   82-monthfollow-up for Dr. YAnnamaria Boots  He continues on pirfenidone.  He is taking full dose.  In the interim he has had hernia surgery 6 weeks ago for right inguinal hernia.  This is gone well.  He is change his exercise from bicycle to walking because of the hernia.  His symptom score is the same.  [facility] 2021 [facility].  Deemed to be stable.  I reviewed that note.  After that he has had pulmonary function test with uKoreacouple weeks ago.  This shows [name] decline.  He says the quality of instructions was poor and therefore the decline is because of that.  At least that is his suspicion.  He feels well.  Walking desaturation test is stable.  He has gained some weight.  He continues to have some nausea and fatigue and diarrhea with the pirfenidone.  But this is stable and manageable.  He feels his irritable bowel syndrome is active because of the pirfenidone.  He plans to see Dr. JJoanell Risingfor that.  His last liver  function test was with Dr. GLauris Chromanin January 2021.  This was normal per the note.  His last CT scan was towards the end of 2020 and deemed to be stable.     OV 04/11/2020   Subjective:  Patient ID: KWestley [name] male , DOB: 401/29/1942 age 89y.o. years. , MRN: [id number]  ADDRESS[place] [id number]  PCrist Infante MD Providers : Dr GAlisia Ferrari Treatment Team:  Attending Provider: RBrand Males MD   Chief Complaint  Patient presents with   Follow-up    PFT performed today.  Pt states he has been doing well since last visit and denies any complaints.    Follow-up idiopathic pulmonary fibrosis.  Diagnosis towards the end of 2018.  On pirfenidone.  Last CT scan of the chest September 2020.   HPI KRANULFO KALL89y.o. -presents for follow-up last seen in April 2021.  After that in July 2021 [facility]  I reviewed the notes.  The feeling was that he was stable.  He is only minimally symptomatic.  He had [name] 6-minute walk test and did really well without any significant desaturations.  He is now resorted to high intensity interval  training based on advice from [name] physical therapist/gym instructor.  He feels like stronger and failure.  He also states that his appetite is better and is eating better.  He feels his weight is stable.  In terms of his IPF symptoms is minimal and documented below.  He just has mild irritable bowel syndrome with his Esbriet and baseline health.  His walking desaturation test today in the office is stable.  His pulmonary function test was reviewed.  Over the last 3 years it shows [name] slight steady decline.  This decline is both in FVC and DLCO.  He is averaging around 2.6% decline per year with FVC while 8% decline in the DLCO per year.  On an average with [name] 3% decline per year in the last 3 years.  We did discuss this.  We discussed #pulmonary fibrosis foundation support group -he will visit the  website.  He gets newsletters from them.  At this point in time he is not interested in joining the support group but he did get the contact information for this and will reach out if he desires so.  #-We also discussed research as [name] care option -we discussed trial sponsored by [place] [facility], hospital of pirfenidone.  The trial is called [employer].  The investigation medical product is called PRM-151 in the axilla macrophage pathway.  Promising phase 2 data.  Research team gave him the consent form under my delegation.  He understands research is voluntary and is to evaluate unproven therapies.  He understands that the primary intent is to contribute to her scientific development and therapeutic gain is secondary because of an approved product the risks benefit ratio is very different compared to blood products.  He is going to read the consent and think about potential participation.  I sent him via email medical journal articles about the investigational product    He tells me that he feels his weight is stable.  His dry weight early in the morning is between 162-163 pounds.  I pointed out that his weight today is 168 pounds and is lower than before.  However he says that the difference rating 168-172 is related to clothing and his weight at home is stable.  He is not concerned about the slight weight loss.  Overall while he agrees he is lost weight since starting Esbriet he feels recently has been stable.  He wants to continue to monitor the situation  Irritable bowel: He said he will check with Dr. 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 Range 03/04/2017 09:13 06/22/2017 09:14 09/20/2017 08:54 12/07/2017 13:49 03/08/2018 10:15 05/10/2018 13:59 08/23/2018 09:29 04/21/2019 11:06 10/04/2019 09:32  04/11/20 10/21/20  FVC-Pre Latest Units: L 3.59 3.70 3.47 3.54 3.56 3.27 3.19 3.59 3.35 3.26 3.2  FVC-%Pred-Pre Latest Units: % 86 89 84 86 86 84 82 90 83 82% 81%  Results for Kenneth, KAUK [name] (MRN [id number]) as of 10/16/2019 09:07  Ref. Range 03/04/2017 09:13 06/22/2017 09:14 09/20/2017 08:54 12/07/2017 13:49 03/08/2018 10:15 05/10/2018 13:59 08/23/2018 09:29 04/21/2019 11:06 10/04/2019 09:32 04/11/20 10/21/20  DLCO unc Latest Units: ml/min/mmHg 20.12 19.77 24.06 21.20 19.63 19.04 18.61 20.89 16.69 17.26 13.6  DLCO unc % pred Latest Units: % 63 62 76 66 62 64  79 86 69 72% 56%   OV 10/22/2020  Subjective:  Patient ID: Kenneth [name], male , DOB: 07/08/1940 , age 89 y.o. , MRN: [id number] , ADDRESS: [place] [id number] PCP [name], MD Patient Care Team: [name], MD as PCP - General (Internal Medicine) Cira Rue, RN as Oncology Nurse Navigator  This Provider for this visit: Treatment Team:  Attending Provider: Brand Males, MD    10/22/2020 -   Chief Complaint  Patient presents with   Follow-up    Fatigue from radiation treatments, more winded and more GI upset from radiation treatments    Follow-up idiopathic pulmonary fibrosis.  Diagnosis towards the end of 2018.  On pirfenidone.  Last CT scan of the chest September 2020.    HPI CALEM COCOZZA 81 y.o. -returns for follow-up.  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  He is finished hormonal treatment and now is on radiation treatment.  Radiation treatment is aggravating his GI side effects from Pirfenidone (Esbriet) and from irritable bowel syndrome.  Is also causing fatigue.  He went [employer] [name] masters in walk the tournament.  This made him more fatigued than what he experienced few years ago.  He said it took him [name] few days to recover.  He used to have weight loss but this is now stabilized it is documented below.  His symptom severity score and his walking desaturation test are stable but his pulmonary  function test continues to show decline.  His DLCO shows [name] relatively more decline but he said there were technique issues today.  He is got upcoming appointment with ILD clinic at [facility] in July.  At this point in time he is interested in phase 3 clinical trials.  We have quite [name] few to off as of summer 2022 but his prostate cancer could be an exclusion.  We will have the research team evaluate.  He is saddened by the loss of his friend Dr. March Rummage.  He is evaluating whether he should join the pulmonary fibrosis foundation at the national level to be [name] board member.     OV 04/03/2021  Subjective:  Patient ID: Kenneth [name], male , DOB: Sep 10, 1940 , age 82 y.o. , MRN: [id number] , ADDRESS: [place] [id number] PCP [name], MD Patient Care Team: [name], MD as PCP - General (Internal Medicine) Cira Rue, RN as Oncology Nurse Navigator  This Provider for this visit: Treatment Team:  Attending Provider: Brand Males, MD    04/03/2021 -   Chief Complaint  Patient presents with   Follow-up    Just performed [name] partial PFT, patient states increased SOB.    Follow-up idiopathic pulmonary fibrosis.  Diagnosis towards the end of 2018.  On pirfenidone.  Last CT scan of the chest September 2020. - > sept 2022  Underlying irritable bowel syndrome  Initial weight loss with pirfenidone and then stabilized  HPI Kenneth [name] 81 y.o. -Dr. Annamaria Boots presents for his IPF follow-up.  Since I last saw him he has had several health issues.  He got diagnosed with prostate cancer and then had radiation to that.  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  He is going to have [name] colonoscopy.  He feels his irritable bowel syndrome pirfenidone and ultimately the radiation made his GI symptoms significantly worse.  In order to combat the fatigue he has been undergoing  physical therapy.  He also try to get back to golf and  took [name] golf swing for practice and then states that he had [name] compression fracture in the spine.  This is probably also because of years of steroid injections in his back.  In addition when he had [name] high-resolution CT chest in September 2022 this month there was some neck nodes and he has had [name] neck CT scan followed by ENT visit to [name].  Apparently fine-needle aspiration was nondiagnostic.  He is scheduled to undergo excision biopsy.  The combination of all this is gotten [name] little bit more anxious and depressed.  As you can see his symptom scores for depression and anxiety are up.  He is also feeling [name] little more short of breath than usual.  He says his pulse ox is in the low 90s when he exerts himself but it still adequate.  Review of his symptoms and objective basis shows his symptom score to be the same.  His walking desaturation test is also the same.  His weight is stable.  His pulmonary function test shows stability in diffusion but [name] decline in FVC compared to earlier this year.  Had [name] high-resolution CT chest that only shows minimal progression fibrosis over 2 years.  Echocardiogram September 2022 did not show any evidence of pulmonary hypertension     CLINICAL DATA:  Pulmonary fibrosis.   EXAM: CT CHEST WITHOUT CONTRAST   TECHNIQUE: Multidetector CT imaging of the chest was performed following the standard protocol without intravenous contrast. High resolution imaging of the lungs, as well as inspiratory and expiratory imaging, was performed.   COMPARISON:  03/28/2019, 03/30/2018 and 02/27/2017. Bone scan 05/21/2020.   FINDINGS: Cardiovascular: Atherosclerotic calcification of the aorta, aortic valve and coronary arteries. Enlarged pulmonic trunk and right and left main pulmonary arteries. Heart is at the upper limits of normal in size. No pericardial effusion.   Mediastinum/Nodes: Low left internal jugular lymph nodes measure up to 8 mm (2/8), previously 3 mm on  03/28/2019. Mediastinal lymph nodes are subcentimeter in short axis size, as before. Hilar regions are difficult to definitively evaluate without IV contrast. Left axillary lymph nodes measure up to 9 mm, are unchanged and not considered enlarged by CT size criteria. High left subpectoral lymph node measures 7 mm (2/11), increased from 4 mm on 03/28/2019. No right axillary adenopathy. Esophagus is grossly unremarkable. Periesophageal lymph nodes are subcentimeter in short axis size and appears similar.   Lungs/Pleura: Peripheral and basilar predominant subpleural reticulation, ground-glass and traction bronchiectasis/bronchiolectasis, minimally progressive from 03/28/2019. Evidence of progression is best seen in the lower lung zones. No pleural fluid. Airway is unremarkable. No air trapping.   Upper Abdomen: Visualized portions of the liver, gallbladder, adrenal glands, kidneys, spleen, pancreas, stomach and bowel are unremarkable.   Musculoskeletal: Degenerative changes in the spine. There is [name] new small rounded sclerotic lesion in the lateral aspect of the right third rib (sagittal image 32). New lucent lesion in the left posterolateral aspect of the T8 vertebral body measuring approximately 1.3 x 1.7 x 2.1 cm (2/84 and sagittal image 96). This likely corresponds to abnormal uptake seen on bone scan 05/21/2020. Finding is new from 03/28/2019. Slight compression of the T12 superior endplate is new.   IMPRESSION: 1. Slight interval progression of pulmonary fibrosis. Findings are consistent with UIP per consensus guidelines: Diagnosis of Idiopathic Pulmonary Fibrosis: An Official ATS/ERS/JRS/ALAT Clinical Practice Guideline. [place], Iss 5, [id number],  Mar 06 2017. 2. New small sclerotic lesion in the lateral aspect of the right third rib, worrisome for metastatic prostate cancer. 3. Lucent lesion in the left posterolateral aspect of the T8 vertebral body  corresponds to abnormal uptake on bone scan 05/21/2020, and is new from CT chest 03/28/2019, raising suspicion for malignancy. 4. Left internal and high left subpectoral lymph nodes are subcentimeter in size but appear slightly larger than on 03/28/2019. Difficult to exclude [name] lymphoproliferative disorder. Please refer to CT neck with contrast done the same day in further evaluation. 5. Slight compression of the T12 superior endplate is new but age indeterminate. 6. Aortic atherosclerosis (ICD10-I70.0). Coronary artery calcification. 7. Enlarged pulmonary arteries, indicative of pulmonary arterial hypertension.     Electronically Signed   By: [name]   On: 03/19/2021 15:12   Xxxxx  IMPRESSION: Neck CT September 2022 Mildly prominent lymph nodes in the neck bilaterally left greater than right. Cluster of left supraclavicular and left posterior lymph nodes all under 1 cm.   Largest lymph node in the neck is [name] right level 3 lymph node measuring 12 mm on axial image 75.     Electronically Signed   By: Franchot Gallo M.D.   On: 03/19/2021 15:24   Xxxx IMPRESSIONS echocardiogram September 2022    1. Left ventricular ejection fraction, by estimation, is 65 to 70%. The  left ventricle has normal function. The left ventricle has no regional  wall motion abnormalities. Left ventricular diastolic parameters are  consistent with Grade I diastolic  dysfunction (impaired relaxation).   2. Right ventricular systolic function is normal. The right ventricular  size is normal. Tricuspid regurgitation signal is inadequate for assessing  PA pressure.   3. Left atrial size was mildly dilated.   4. The mitral valve is abnormal. Trivial mitral valve regurgitation. No  evidence of mitral stenosis.   5. The aortic valve is tricuspid. There is mild calcification of the  aortic valve. There is moderate thickening of the aortic valve. Aortic  valve regurgitation is not visualized.  Mild aortic valve stenosis. Aortic  valve area, by VTI measures 1.62 cm.  Aortic valve mean gradient measures 12.0 mmHg. Aortic valve Vmax measures  2.31 m/s. DI is 0.43.   6. Aortic dilatation noted. There is borderline dilatation of the aortic  root, measuring 39 mm. There is borderline dilatation of the ascending  aorta, measuring 39 mm.   7. The inferior vena cava is normal in size with <50% respiratory  variability, suggesting right atrial pressure of 8 mmHg.   Comparison(s): Changes from prior study are noted. 03/02/2017: LVEF 60-65%,  mild LVH, grade 1 DD, aortic root 39 mm, no aortic stenosis.   OV 06/17/2021  Subjective:  Patient ID: Kenneth [name], male , DOB: [birth year] , age 41 y.o. , MRN: [id number] , ADDRESS: [place] [id number] PCP [name], MD Patient Care Team: [name], MD as PCP - General (Internal Medicine) Cira Rue, RN as Oncology Nurse Navigator O'Kelley, Mindi Slicker, RN as Oncology Nurse Navigator  This Provider for this visit: Treatment Team:  Attending Provider: Brand Males, MD    06/17/2021 -   Chief Complaint  Patient presents with   Follow-up    Pt states he was recently diagnosed with B-Cell Lymphoma.     HPI WETZEL MEESTER 81 y.o. 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  Since his last visit from ILD standpoint he continues to be stable.  His walking  desaturation test is stable.  However he is reporting more shortness of breath but this is directly related to more fatigue.  Review of the labs indicate in October 2022 he had mild anemia and also mild hyponatremia.  In the past radiation therapy for prostate made him extremely tired.  He is also on pirfenidone that can cause tiredness.  He now has lymphoma also that can cause tiredness.  He has upcoming visit in January 2022 with [place] [facility]D clinic    In the interim since last visit he had [name] cervical lymphadenopathy excision biopsy that shows B-cell lymphoma.  He seen Dr.  Kavin Leech.  They are planning to start R-CHOP treatment after Christmas.  He is frustrated by the recurrence of various medical illnesses.  The second cancer since his IPF diagnosis.  In terms of his IPF: He is now on generic pirfenidone.  He feels the nausea is worse.  He wants to switch to branded Esbriet.  Gave him donor sample of Esbriet by [employer].  Lot number [id number] with expiration June 2025 [unopened bottle], lot number [id number] with expiration 05/2023 [unopened bottle and also lot number [id number] with expiration 12/2023 [partial bottle].  He is asked me to send [name] message to the pharmacist to make the switch.  He says even if it is more expensive he will play for the branded Esbriet because it is better quality of life.  He is worried that nausea and other side effects will get worse particularly once he starts chemotherapy.   No results found.   OV 09/18/2021  Subjective:  Patient ID: Kenneth [name], male , DOB: 15-Jan-1941 , age 21 y.o. , MRN: [id number] , ADDRESS: [place] [phone] PCP [name], MD Patient Care Team: [name], MD as PCP - General (Internal Medicine) Cira Rue, RN as Oncology Nurse Navigator O'Kelley, Mindi Slicker, RN as Oncology Nurse Navigator  This Provider for this visit: Treatment Team:  Attending Provider: Brand Males, MD  Follow-up idiopathic pulmonary fibrosis.  Diagnosis towards the end of 2018.  On pirfenidone.  Last CT scan of the chest September 2020. - > sept 2022  Underlying irritable bowel syndrome  Initial weight loss with pirfenidone and then stabilized  History of prostate cancer  New diagnosis of B-cell lymphoma cervical lymphadenopathy -late 2022  Multifactorial fatigue  - mild anemia OCt 2022  - mild hypontermia OCt 2022  09/18/2021 -   Chief Complaint  Patient presents with   Follow-up    PFT performed today.  Pt states he is about the same since last visit. States that he is currently  undergoing chemotherapy due to diagnosis of lymphoma.     HPI DWAINE PRINGLE 81 y.o. -returns for follow-up.  He is taking Esbriet instead of generic pirfenidone.  He is also taking 3 little pills 3 times daily.  He finds that Esbriet to be better than generic pirfenidone.  He finds 3 little pills better than 1 whole pill in terms of nausea.  Still is dealing with [name] lot of nausea both from the chemo and Esbriet.  He says yesterday was really bad day with nausea.  He does take Zofran but we discussed and said he will try taking the Zofran 30 to 60 minutes before the Esbriet.  Nevertheless he has been able to gain some weight.  He is actually feeling slightly less short of breath.  He is able to do more particularly after his back pain has resolved.  Nevertheless  he is on antiprostate cancer therapy.  He is also undergoing chemotherapy from his B-cell lymphoma.  His mood is better.  He says he is socially isolating significantly because of his pulm fibrosis and cancer history.  He wanted to know if he could go out.  He did go to [employer] for show but wore [name] mask.  I did indicate to him that he could follow the CDC map to see prevalence of respiratory viruses and when the problem is overall low he could take more risk.  Also like being outdoors is less risky.  If he is endorsing particularly [name] cluster putting [name] mask on reduces his risk.  But I did say that he could use these guardrails and be more active so he can get some socialization and help his mood.  Can also give him purpose.  He is now sold his house and is planning to move into [facility].  In terms of his walking desaturation test and symptoms he stable.  In terms of his pulmonary function test his DLCO is stable but his FVC is down.  He is feeling similar.  Most of the decline seems to be from several years ago but most recently appears to be stable overall. We did discuss clinical trials but cancer generally is an exclusion but  otherwise he would be interested.  We did discuss potential for doing inhaled treprostinil trial.  We did discuss the fact the neck step and standard of care would be oxygen if he were to decline but so far he has held good.  He recently lost his friend [facility] who also had pulmonary fibrosis.     He has upcoming [employer] appointment but he missed the 1 earlier in the year because of all ischemia.  He is thinking about holding off on going to [facility] and just following here.  I was fine with this.  CT Chest data  CT Chest data  No results found.     OV 10/28/2021  Subjective:  Patient ID: Kenneth [name], male , DOB: 1941-03-03 , age 5 y.o. , MRN: [id number] , ADDRESS: [place] [phone] PCP [name], MD Patient Care Team: [name], MD as PCP - General (Internal Medicine) Cira Rue, RN as Oncology Nurse Navigator O'Kelley, Mindi Slicker, RN as Oncology Nurse Navigator  This Provider for this visit: Treatment Team:  Attending Provider: Brand Males, MD    10/28/2021 -   Chief Complaint  Patient presents with   Acute Visit    Pt states since his last chemo treatment, he has been having problems with his breathing and also states that his pulse has been elevated.   Follow-up idiopathic pulmonary fibrosis.  Diagnosis towards the end of 2018.  On pirfenidone.  Last CT scan of the chest September 2020. 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 -presents with his wife.  Status meeting his wife for the first time but she reminded me that she had made him early on in the course of his illness.  He is finished cycles of R-CHOP treatment for his lymphoma.  On  10/23/2021 Thursday he saw [name] nurse practitioner in oncology.  At that  point it was noticed that he had finished mini CHOP treatment 5 rounds 10/02/2021.  He did complete his 6 cycle.  He was feeling well.  His resting heart rate was 82.  Pulse ox at rest was 100%.  His white count was observed to be slightly low.  They discussed several options and decided to proceed with chemotherapy followed by [facility] single dose on 10/24/2021 Friday.  He was doing fairly okay then on Sunday, 10/26/2021 he noticed resting tachycardia heart rate 125 but no fever.  At no point any fever.  No cough no chills no nausea no vomiting no diarrhea.  The same thing persisted even on Monday, 10/27/2021.  He noticed that if he walked his heart rate was up to 140 he became short of breath and stop.  His pulse ox would drop to 90% but no abnormal desaturations.  He started getting worried.  He went to [employer] yesterday 10/27/2021.  EKG reviewed sinus tachycardia with some ectopic atrial beats.  Nothing acute.    Labs: Found to have mild hyponatremia.  Also found to have mild anemia white count in response to Udenyca went from low to  high.No D-dimer.  No troponin no BNP checked.  Last echo and CT scan of the chest in September 2022.  Walking desaturation test is definitely more tachycardic than baseline.  Associated with this he is also got exaggerated pulse ox drop although still adequate for simple walking test.  His pace is slower.   We repeated an EKG here: Personally visualized.  Sinus tachycardia heart rate 120 approximately.  Nothing acute.         OV 01/09/2022  Subjective:  Patient ID: Kenneth [name], male , DOB: Nov 14, 1940 , age 89 y.o. , MRN: [id number] , ADDRESS: [address] [place] [facility] [phone] PCP [name], MD Patient Care Team: [name], MD as PCP - General (Internal Medicine) Cira Rue, RN as Oncology Nurse Navigator O'[place]elley, Mindi Slicker, RN as Oncology Nurse Navigator  This Provider  for this visit: Treatment Team:  Attending Provider: Brand Males, MD    01/09/2022 -   Chief Complaint  Patient presents with   Follow-up    PFT performed today. Pt states that he feels like he may be [name] little worse since last visit. States that he has had some problems with congestion which he wonders could be due to the weather.   Follow-up idiopathic pulmonary fibrosis.  Diagnosis towards the end of 2018.  On pirfenidone.  Last CT scan of the chest September 2020. 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  At this point in time he is finished his chemotherapy.  In April 2023 he did have some tachycardic response following the last chemo.  Cardiac work-up ended up being fine.  He says overall he is been feeling fine.  He is now relocated to [name] retirement home in [place].  [place]  During this time his pulse ox immediately after exertion was always in the 90s.  He did not feel that his IPF was getting worse when he was there.  He just  came back yesterday.  However for the last few days he is having increased fatigue nasal congestion.  He also feels his left ear is blocked.  He tried to do pulmonary function test today.  He felt he did not give [name] good effort particularly the DLCO.  Consistent with this his numbers are much worse.  His FVC shows [name] 7% decline compared to March 2023 and DLCO 14% decline compared to March 2023.  His symptom score seems similar to September 2022.  His walking desaturation test is also stable on this level ground.  His weight loss is stabilized since he is at 168 pounds.  He is  frustrated by this.  Is also frustrated by his multiple health issues.  The pirfenidone he is taking the generic version.  He is tolerating it but is requiring Zofran which then causes constipation.  Emotionally he is looking for [name] break from all his health issues  [name] today.    SYMPTOM SCALE - ILD 08/23/2018  04/21/2019  10/16/2019  04/11/2020 168# 10/22/2020 170# - esbriet and XRT for prostate 04/03/2021 168# 06/17/2021 168# lymphoma 09/18/2021 172# 01/09/2022 168#  O2 use ra e ra   ra ra ra ra  Shortness of Breath 0 -> 5 scale with 5 being worst (score 6 If unable to do)          At rest 0 0 0 0 0 0 0 0 0  Simple tasks - showers, clothes change, eating, shaving *0** 0 0 0 0 0 0 0 0  Household (dishes, doing bed, laundry) 0 0 0 0 0 1 1 0 1  Shopping 0 0 0 0 0 0 1 0 1  Walking level at own pace 0 0 0 0 0 0 1 0 1  Walking up Stairs _0 Total (40 - 48) Dyspnea Score _1 How bad is your cough? 0 0 0 0 0 0 1 2 0  How bad is your fatigue 1 due to esbriet 0 2 0 2 1 3._2 nausea   1 0 _3 0 1  vomit   0 0 0 0 0 0 0  diarrhea   _4 0 1  anxiety   0 0 0 _5 depresso0   0 0 0 _6 Simple office walk 185 feet x  3 laps goal with forehead probe 12/07/2017 Weight 180# 03/08/2018 Weight 174.9 05/10/2018 Weight 174# 08/23/2018 Weight 178# 04/21/2019 Weight 170# 10/16/2019 173# 04/11/2020 168# 10/22/2020 170# 04/03/2021 168# 06/17/2021 168# 09/18/2021  10/28/2021  01/09/2022 168#  O2 used _7  Room air   ra ra ra  ra  Number laps completed _8 Comments about pace Moderate pace Mod pace Mod fast pace fast  fast   fast   Avg pace - slower than before   Resting Pulse Ox/HR 100% and 83/min 100% ad 85/min 100% and 91/min 100% and 82/min  100% asnd 91 100% and 83/min 98% and 94/min 100% and 82 100% and 93 100% and 78 99% and H 126 99% and HR  88  Final Pulse Ox/HR 99% and 98/min 98$  and 104/min 98% and 108/min 98% and 100/min  96% and 106/mom 98% and 96/mn 95% and 110.min 97% and 105 97% ad 109 97% and 94 92% and HR 143 97% and HR 111  Desaturated </= 88% no no no no  no         Desaturated <= 3% points no no ni no  yesm 4 points  Yes, 3 points Yes, 3 poitn Yes 3 pomnts Yes 3 Yes, 7 points   Got Tachycardic >/= 90/min yes yes yes yes  yes  _0    Symptoms at end of test No complaints No complaints  No complaints  Mild dyspnea  no Mild dyspnea Mild dyspnea Mild dysnea Modeate dyspnea   Miscellaneous comments x ? sligh increase in tachy/stable ? incraeased tachty       Stable since April 2021        PFT     Latest Ref Rng & Units 01/09/2022    9:17 AM 09/18/2021    8:52 AM 04/03/2021    9:21 AM 10/21/2020    8:56 AM 04/11/2020   10:04 AM 10/04/2019    9:32 AM 04/21/2019   11:06 AM  PFT Results  FVC-Pre L 2.49  P 2.68  2.96  3.20  3.26  3.35  3.59  P  FVC-Predicted Pre % 64  P 68  75  81  82  83  90  P  Pre FEV1/FVC % % 87  P 84  81  81  80  82  81  P  FEV1-Pre L 2.16  P 2.25  2.38  2.59  2.62  2.74  2.92  P  FEV1-Predicted Pre % 78  P 80  85  91  92  95  102  P  DLCO uncorrected ml/min/mmHg 12.55  P 13.07  16.15  13.64  17.26  16.69  20.89  P  DLCO UNC% % 53  P 54  67  56  72  69  86  P  DLCO corrected ml/min/mmHg 12.55  P 14.64  16.15  13.64  17.26  17.31    DLCO COR %Predicted % 53  P 61  67  56  72  71    DLVA Predicted % 91  P 105  91  80  94  91  109  P    P Preliminary result       has [name] past medical history of Arthritis, Diverticulosis of colon (without mention of hemorrhage), GERD (gastroesophageal reflux disease), Hiatal hernia, HLD (hyperlipidemia), Internal hemorrhoids without mention of complication, Irritable bowel syndrome, Other specified gastritis without mention of hemorrhage, Prostate cancer ([place]), Pulmonary fibrosis ([place]), and Stress fracture (02/2021).   reports that he has never smoked.  He has never used smokeless tobacco.  Past Surgical History:  Procedure Laterality Date   CARPAL TUNNEL RELEASE     left   CATARACT EXTRACTION  06/2011   bilateral   DEEP NECK LYMPH NODE BIOPSY / EXCISION  03/25/2021   INGUINAL HERNIA REPAIR Right 09/05/2019   Procedure: OPEN REPAIR RIGHT INGUINAL HERNIA WITH MESH;  Surgeon: Armandina Gemma, MD;  Location: [facility];  Service: General;  Laterality: Right;   [place]   x 2   LUMBAR LAMINECTOMY/DECOMPRESSION MICRODISCECTOMY Left 07/11/2013   Procedure: LUMBAR ONE TO TWO, LUMBAR TWO TO THREE, LUMBAR THREE TO FOUR LUMBAR LAMINECTOMY/DECOMPRESSION MICRODISCECTOMY 3 LEVELS;  Surgeon: Charlie Pitter, MD;  Location: [facility];  Service: Neurosurgery;  Laterality: Left;   NASAL SINUS SURGERY      No Known Allergies  Immunization History  Administered Date(s) Administered   Fluad Quad(high Dose 65+) 03/18/2020   Influenza Split 10/03/2009, 03/28/2010, 04/14/2011, 03/22/2012, 03/09/2013, 03/28/2014   Influenza, High Dose Seasonal PF 03/19/2016, 03/06/2017, 04/09/2018, 03/07/2019, 04/06/2020   Influenza, Quadrivalent, Recombinant, Inj, Pf 03/12/2018, 03/10/2019, 04/06/2020   Influenza,inj,Quad PF,6+ Mos 04/04/2014, 03/26/2015, 04/05/2016   Influenza-Unspecified 04/05/2016, 04/05/2021   PFIZER Comirnaty(Gray Top)Covid-19 Tri-Sucrose Vaccine 10/15/2020   PFIZER(Purple Top)SARS-COV-2 Vaccination 07/20/2019, 08/10/2019, 02/27/2020, 03/23/2021   Pneumococcal Conjugate-13 03/30/2013, 07/31/2013, 04/26/2018   Pneumococcal Polysaccharide-23 10/03/2009, 01/14/2010, 03/31/2018, 02/18/2021   Pneumococcal-Unspecified 04/05/2016   Td 10/20/2016   Tdap 10/03/2009   Zoster Recombinat (Shingrix) 01/03/2017   Zoster, Live 10/03/2009    Family History  Problem Relation Age of Onset   Heart failure Father 45   Bladder Cancer Mother    Breast cancer Mother    Colon cancer Neg Hx    Pancreatic cancer Neg Hx    Rectal cancer Neg Hx     Stomach cancer Neg Hx    Prostate cancer Neg Hx      Current Outpatient Medications:    acetaminophen-codeine (TYLENOL #3) 300-30 MG per tablet, Take 1 tablet by mouth every 8 (eight) hours as needed for moderate pain., Disp: , Rfl:    aspirin EC 81 MG tablet, Take 81 mg by mouth 3 (three) times [name] week., Disp: , Rfl:    Bacillus Coagulans-Inulin (ALIGN PREBIOTIC-PROBIOTIC PO), Take by mouth., Disp: , Rfl:    carisoprodol (SOMA) 350 MG tablet, Take 175 mg by mouth 3 (three) times daily as needed for muscle spasms., Disp: , Rfl:    Cholecalciferol (VITAMIN D-3) 1000 UNITS CAPS, Take 2,000 Units by mouth every evening., Disp: , Rfl:    CO-ENZYME Q-10 PO, Take 1 tablet by mouth every evening. , Disp: , Rfl:    Cyanocobalamin (VITAMIN B12) 1000 MCG TBCR, Take 1,000 mcg by mouth daily., Disp: , Rfl:    denosumab (PROLIA) 60 MG/ML SOSY injection, Inject 60 mg into the skin every 6 (six) months. At [place], Disp: , Rfl:    diclofenac (VOLTAREN) 75 MG EC tablet, Take 75 mg by mouth 2 (two) times daily as needed (pain.)., Disp: , Rfl: 2   diclofenac sodium (VOLTAREN) 1 % GEL, Apply 4 g topically 4 (four) times daily as needed (pain.)., Disp: , Rfl: 12   dicyclomine (BENTYL) 10 MG capsule, Take one by mouth every 6 hours as needed for abdominal cramping (Patient taking differently: Take 10 mg by mouth every 6 (six) hours as needed (abdominal spasms.).), Disp: 30 capsule, Rfl: 1   diphenoxylate-atropine (LOMOTIL) 2.5-0.025 MG tablet, Take 1 tablet by mouth 4 (four) times daily as needed for diarrhea or loose stools., Disp: , Rfl:    ezetimibe (ZETIA) 10 MG tablet, Take 10 mg by mouth daily., Disp: , Rfl:    Fluticasone Propionate (XHANCE) 93 MCG/ACT EXHU, 1 spray in each nostril, Disp: , Rfl:    Glucosamine Sulfate 500 MG TABS, Take 500 mg by mouth daily., Disp: , Rfl:    HYDROcodone-acetaminophen (NORCO/VICODIN) 5-325 MG tablet, Take 1-2 tablets by mouth every 4 (four) hours as needed for  moderate pain., Disp: 24 tablet, Rfl: 0   leuprolide, 6 Month, (ELIGARD) 45 MG injection, Inject 45 mg into the skin every 6 (six) months. Per Dr. Tresa Moore office, Disp: , Rfl:    loratadine (CLARITIN) 10 MG tablet, Take 10  mg by mouth daily., Disp: , Rfl:    meclizine (ANTIVERT) 25 MG tablet, Take 25 mg by mouth 3 (three) times daily as needed for dizziness. , Disp: , Rfl:    methocarbamol (ROBAXIN) 500 MG tablet, take 1 tab 4 times daily prn spasm, Disp: , Rfl:    Multiple Vitamin (MULTIVITAMIN) tablet, Take 1 tablet by mouth daily., Disp: , Rfl:    omeprazole (PRILOSEC) 40 MG capsule, Take 1 capsule (40 mg total) by mouth daily., Disp: 30 capsule, Rfl: 11   ondansetron (ZOFRAN) 8 MG tablet, Take 1 tablet (8 mg total) by mouth every 8 (eight) hours as needed for nausea or vomiting. Start 72 hours after IV chemotherapy administration, Disp: 30 tablet, Rfl: 1   oxybutynin (DITROPAN) 5 MG tablet, Take 5 mg by mouth every 8 (eight) hours as needed., Disp: , Rfl:    Pirfenidone (ESBRIET) 801 MG TABS, Take 1 tablet by mouth 3 (three) times daily with meals., Disp: 270 tablet, Rfl: 1   prochlorperazine (COMPAZINE) 10 MG tablet, Take 1 tablet (10 mg total) by mouth every 6 (six) hours as needed for nausea., Disp: 60 tablet, Rfl: 1      Objective:   Vitals:   01/09/22 1015  BP: 116/70  Pulse: 92  Temp: 97.6 F (36.4 C)  TempSrc: Oral  SpO2: 97%  Weight: 168 lb (76.2 kg)  Height: _0  (1.753 m)    Estimated body mass index is 24.81 kg/m as calculated from the following:   Height as of this encounter: _1  (1.753 m).   Weight as of this encounter: 168 lb (76.2 kg).  _2 @  Filed Weights   01/09/22 1015  Weight: 168 lb (76.2 kg)     Physical Exam   General: No distress. Looks well Neuro: Alert and Oriented x 3. GCS 15. Speech normal Psych: Pleasant Resp:  Barrel Chest - no.  Wheeze - no, Crackles -  VELCRO AT BASE, No overt respiratory distress CVS: Normal heart sounds.  Murmurs - no Ext: Stigmata of Connective Tissue Disease - no HEENT: Normal upper airway. PEERL +. No post nasal drip . LEFT EAR - NO CONE OF LIGHT. RIGH EAR -0 NORMAL     LABS    PULMONARY No results for input(s): "PHART", "PCO2ART", "PO2ART", "HCO3", "TCO2", "O2SAT" in the last 168 hours.  Invalid input(s): "PCO2", "PO2"  CBC Recent Labs  Lab 01/09/22 1108  HGB 12.2*  HCT 35.6*  WBC 6.2  PLT 209.0    COAGULATION No results for input(s): "INR" in the last 168 hours.  CARDIAC  No results for input(s): "TROPONINI" in the last 168 hours. Recent Labs  Lab 01/09/22 1108  PROBNP 32.0     CHEMISTRY Recent Labs  Lab 01/09/22 1108  NA 133*  K 4.4  CL 99  CO2 28  GLUCOSE 100*  BUN 13  CREATININE 0.66  CALCIUM 9.0   Estimated Creatinine Clearance: 73.6 mL/min (by C-G formula based on SCr of 0.66 mg/dL).   LIVER Recent Labs  Lab 01/09/22 1108  AST 22  ALT 18  ALKPHOS 43  BILITOT 0.4  PROT 7.0  ALBUMIN 4.4     INFECTIOUS No results for input(s): "LATICACIDVEN", "PROCALCITON" in the last 168 hours.   ENDOCRINE CBG (last 3)  No results for input(s): "GLUCAP" in the last 72 hours.       IMAGING x48h  - image(s) personally visualized  -   highlighted in bold No results found.  Assessment:       ICD-10-CM   1. 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 Encounter for therapeutic drug monitoring  Z51.81     3. Weight loss  R63.4     4. Nasal congestion  R09.81     5. Eustachian tube dysfunction, left  H69.82     6. 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 IPF worse versus challenges doing PFT because of nasal congestin issues  Plan  - check bmet, cbc, bnp, LFT 01/09/2022 - continue pirfenidone - monitor pulse ox even with exertion - goal >= 88% - IPF PRO registry visit 01/09/2022   Weight  loss  ? Ongoing v recently stabilized  Plan  - monitor  Nasal congestion Eustachian tube dysfunction, left  - new x few days. Could be due to recent return from [place]. Not responding to inhalers  Plan  - 4 day prednisone - Please take prednisone  30 mg x1 day, then 20 mg x1 day, then 10 mg x1 day, and then 5 mg x1 day and stop   Travel advice encounter -back to [employer]  - monitor pulse ox with exertion - keep it > 88%  Followup  - text.email me next week - Video or face to face visit with APP in 2 weeks   - walk test during this visit - 2 moniths do spirometry and dlco -  47month Mallerie Blok - 30 min visit   High complex medical condition requiring intensive therapeutic monitoring  SIGNATURE    Dr. MBrand Males M.D., F.C.C.P,  Pulmonary and Critical Care Medicine Staff Physician, CEagle LakeDirector - Interstitial Lung Disease  Program  Pulmonary [place] [address] Pager: [phone] If no answer or between  15:00h - 7:00h: call [phone] Telephone: [phone]  10:55 AM 01/09/2022

## 2022-01-09 NOTE — Patient Instructions (Addendum)
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 IPF worse versus challenges doing PFT because of nasal congestin issues  Plan  - check bmet, cbc, bnp, LFT 01/09/2022 - continue pirfenidone - monitor pulse ox even with exertion - goal >= 88% - IPF PRO registry visit 01/09/2022   Weight loss  STabilized since sept 2022 at 168#  Plan  - monitor  Nasal congestion Eustachian tube dysfunction, left  - new x few days. Could be due to recent return from [place]. Not responding to inhalers  Plan  - 4 day prednisone - Please take prednisone  30 mg x1 day, then 20 mg x1 day, then 10 mg x1 day, and then 5 mg x1 day and stop   Travel advice encounter -back to [employer]  - monitor pulse ox with exertion - keep it > 88%  Followup  - text.email me next week - Video or face to face visit with APP in 2 weeks   - walk test during this visit - 2 moniths do spirometry and dlco -  63month Samyah Bilbo - 30 min visit

## 2022-01-14 NOTE — Research (Signed)
Title: Chronic Fibrosing Interstitial Lung Disease with Progressive Phenotype Prospective Outcomes (ILD-PRO) Registry    Protocol #: IPF-PRO-SUB, Clinical Trials # [id number], Sponsor: [employer]   Protocol Version Amendment 4 dated 12Sep2019  and confirmed current on  Consent Version for today's visit date of  Is [facility] Approved Version 09 Jun 2018 Revised 09 Jun 2018   Objectives:  Describe current approaches to diagnosis and treatment of chronic fibrosing ILDs with progressive phenotype  Describe the natural history of chronic fibrosing ILDs with progressive phenotype  Assess quality of life from self-administered participant reported questionnaires for each disease group  Describe participant interactions with the healthcare system, describe treatment practices across multiple institutions for each disease group  Collect biological samples linked to well characterized chronic fibrosing ILDs with progressive phenotype to identify disease biomarkers  Collect data and biological samples that will support future research studies.                                            Key Inclusion Criteria: Willing and able to provide informed consent  Age ? 30 years  Diagnosis of a non-IPF ILD of any duration, including, but not limited to Idiopathic Non-Specific Interstitial Pneumonia (INSIP), Unclassifiable Idiopathic Interstitial Pneumonias (IIPs), Interstitial Pneumonia with Autoimmune Features (IPAF), Autoimmune ILDs such as Rheumatoid Arthritis (RA-ILD) and Systemic Sclerosis (SSC-ILD), Chronic Hypersensitivity Pneumonitis (HP), Sarcoidosis or Exposure-related ILDs such as asbestosis.  Chronic fibrosing ILD defined by reticular abnormality with traction bronchiectasis with or without honeycombing confirmed by chest HRCT scan and/or lung biopsy.  Progressive phenotype as defined by fulfilling at least one of the criteria below of fibrotic changes (progression set point)  within the last 24 months regardless of treatment considered appropriate in individual ILDs:  decline in FVC % predicted (% pred) based on >10% relative decline  decline in FVC % pred based on ? 5 - <10% relative decline in FVC combined with worsening of respiratory symptoms as assessed by the site investigator  decline in FVC % pred based on ? 5 - <10% relative decline in FVC combined with increasing extent of fibrotic changes on chest imaging (HRCT scan) as assessed by the site investigator  decline in DLCO % pred based on ? 10% relative decline  worsening of respiratory symptoms as well as increasing extent of fibrotic changes on chest imaging (HRCT scan) as assessed by the site investigator independent of FVC change.     Key Exclusion Criteria: Malignancy, treated or untreated, other than skin or early stage prostate cancer, within the past 5 years  Currently listed for lung transplantation at the time of enrollment  Currently enrolled in a clinical trial at the time of enrollment in this registry       Clinical Research Coordinator / Research RN note : This visit for Kenneth [name] Subject [id number] with DOB: [phone] on 07Jul2023 for the above protocol is follow up a Visit/Encounter and is for purpose of research.    Subject expressed continued interest and consent in continuing as a study subject. Subject confirmed that there was no change in contact information (e.g. address, telephone, email). Subject thanked for participation in research and contribution to science.     During this visit on 07Jul2023 , the subject completed the blood work and questionnaires per the above referenced protocol. Please refer to the subject's paper source binder for further details.  Signed by [place] Assistant [facility]  [place], [place] 04:12pm [phone]

## 2022-01-20 DIAGNOSIS — R3915 Urgency of urination: Secondary | ICD-10-CM | POA: Diagnosis not present

## 2022-01-20 DIAGNOSIS — C61 Malignant neoplasm of prostate: Secondary | ICD-10-CM | POA: Diagnosis not present

## 2022-01-24 ENCOUNTER — Encounter: Payer: Self-pay | Admitting: Oncology

## 2022-01-26 ENCOUNTER — Other Ambulatory Visit: Payer: Self-pay

## 2022-01-26 ENCOUNTER — Ambulatory Visit (INDEPENDENT_AMBULATORY_CARE_PROVIDER_SITE_OTHER): Payer: PPO | Admitting: Nurse Practitioner

## 2022-01-26 ENCOUNTER — Encounter: Payer: Self-pay | Admitting: Nurse Practitioner

## 2022-01-26 DIAGNOSIS — H6982 Other specified disorders of Eustachian tube, left ear: Secondary | ICD-10-CM | POA: Diagnosis not present

## 2022-01-26 DIAGNOSIS — J84112 Idiopathic pulmonary fibrosis: Secondary | ICD-10-CM | POA: Diagnosis not present

## 2022-01-26 DIAGNOSIS — H6992 Unspecified Eustachian tube disorder, left ear: Secondary | ICD-10-CM | POA: Insufficient documentation

## 2022-01-26 DIAGNOSIS — J329 Chronic sinusitis, unspecified: Secondary | ICD-10-CM

## 2022-01-26 NOTE — Assessment & Plan Note (Signed)
Worsening sinusitis at previous OV; treated with steroid taper. Improved symptoms. He has chronic sinusitis and hx of nasal polyps. There is a polyp in his left nostril upon my exam. He is currently in the process of scheduling an appt with [name] with ENT. No changes to current regimen made today.

## 2022-01-26 NOTE — Assessment & Plan Note (Signed)
Stable without change in his breathing. Overall, he is feeling well and without any significant concerns today. Feels as though his decline in his PFTs were related to his nasal congestion, which has improved some and back to his baseline. Symptom score 2 weeks ago stable. Tolerating pirfenidone well with stable weight. Walking oximetry at last visit was without desaturations on room air. His oxygen has been in the 90's at home. Plans for repeat spiro/DLCO in early October.   Patient Instructions  Continue Pirfenidone 801 mg (1 tab) Three times a day with food. Wear sunscreen when outside Continue fluticasone nasal spray 1 spray each nostril daily Continue claritin 1 tab daily  Continue zofran 1 tab every 8 hours as needed for nausea or vomiting   Follow up with [name] once you get your appointment.   Repeat your pulmonary testing as scheduled in October  Follow up with Dr. [name] 10/3 as scheduled. If symptoms do not improve or worsen, please contact office for sooner follow up or seek emergency care.

## 2022-01-26 NOTE — Progress Notes (Signed)
_0  ID: Kenneth [name], male    DOB: 19-Dec-1940, 81 y.o.   MRN: [id number]  Chief Complaint  Patient presents with   Follow-up    He reports that he is doing better since last OV. He had treatment for sinus pressure but is doing well.     Referring provider: [name], MD  HPI: 81 year old male, never smoker followed for IPF. He is a patient of [name] and last seen in office 01/09/2022. Past medical history significant for chronic sinusitis, hiatal hernia, IBS, GERD, arthritis, HLD. He has a history of stage IIIC prostate cancer followed by [name] s/p XRT 2022 and on ADT. He was diagnosed with with B-cell lymphoma in 2022 and completed chemo in April 2023. He is followed by Dr. [name] with oncology.   TEST/EVENTS:  03/19/2021 HRCT: There is atherosclerosis and an enlarged pulmonic trunk.  Right and left main pulmonary arteries are also enlarged.  Some lymph nodes have increased in size when compared to previous exam.  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  There is a new small sclerotic lesion in the lateral aspect of the right third rib, worrisome for metastatic disease.  There is a lesion of the T8 vertebral body, corresponds to abnormal uptake on bone scan from 2021, and suspicious for malignancy. 09/29/2021 PET scan: Near complete resolution of the hypermetabolic lymph nodes above and below the diaphragm, now without significant hypermetabolic nodal disease; this is consistent with treatment response.  01/09/2022: OV with Dr. [name]. IPF diagnosed end of 2018; currently on pirfenidone and doing well on this. Initially had some weight loss but this stabilized. New diagnossi of large B-cell lymphoma late 2022; completed chemotherapy. Had some tachycardia as a response to treatment.  Cardiac work-up ended up being fine in April 2023.   Overall, reported feeling well.  [place] the mountains and felt like his IPF was stable.  Pulse ox after exertion was always in the 90s.  However, over the past few days has increased fatigue and nasal congestion.  Also feels like his left ear is blocked.  He did try to do PFTs today but did not feel like he had good effort; consistent with this his numbers were much worse with 7% decline in FVC and 14% decline in DLCO.  Symptom score seems similar to September 2022.  Walking desaturation is also stable.  Having some frustration regarding multiple health issues.  He is tolerating Esbriet but has to take Zofran with it which then cause constipation.  Will treat for sinusitis with prednisone taper.  Plan for repeat spirometry and DLCO in 2 months for reevaluation.  01/26/2022: Today - follow up Patient presents today for follow up after being treated for sinusitis. He reports feeling better but still has issues with nasal congestion and pressure in his left ear. They did improve with prednisone taper. These are problems he has been dealing with for a while now. He has tried multiple different nasal sprays with some relief. 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 He is sure this was the result of his worsening pulmonary function testing. He is working on getting in to see [name] with ENT. Otherwise, no concerns today. Breathing is overall stable. He gets winded with stair climbing but otherwise, no issues. Doesn't feel like his activity is limited by his breathing. He's planning to go back to the mountains in the next  few weeks.   No Known Allergies  Immunization History  Administered Date(s) Administered   Fluad Quad(high Dose 65+) 03/18/2020   Influenza Split 10/03/2009, 03/28/2010, 04/14/2011, 03/22/2012, 03/09/2013, 03/28/2014   Influenza, High Dose Seasonal PF 03/19/2016, 03/06/2017, 04/09/2018, 03/07/2019, 04/06/2020   Influenza, Quadrivalent, Recombinant, Inj, Pf 03/12/2018, 03/10/2019, 04/06/2020    Influenza,inj,Quad PF,6+ Mos 04/04/2014, 03/26/2015, 04/05/2016   Influenza-Unspecified 04/05/2016, 04/05/2021   PFIZER Comirnaty(Gray Top)Covid-19 Tri-Sucrose Vaccine 10/15/2020   PFIZER(Purple Top)SARS-COV-2 Vaccination 07/20/2019, 08/10/2019, 02/27/2020, 03/23/2021   Pneumococcal Conjugate-13 03/30/2013, 07/31/2013, 04/26/2018   Pneumococcal Polysaccharide-23 10/03/2009, 01/14/2010, 03/31/2018, 02/18/2021   Pneumococcal-Unspecified 04/05/2016   Td 10/20/2016   Tdap 10/03/2009   Zoster Recombinat (Shingrix) 01/03/2017   Zoster, Live 10/03/2009    Past Medical History:  Diagnosis Date   Arthritis    Diverticulosis of colon (without mention of hemorrhage)    GERD (gastroesophageal reflux disease)    Hiatal hernia    HLD (hyperlipidemia)    Internal hemorrhoids without mention of complication    Irritable bowel syndrome    Other specified gastritis without mention of hemorrhage    Prostate cancer ([facility])    Pulmonary fibrosis ([place])    Stress fracture 02/2021    Tobacco History: Social History   Tobacco Use  Smoking Status Never  Smokeless Tobacco Never   Counseling given: Not Answered   Outpatient Medications Prior to Visit  Medication Sig Dispense Refill   acetaminophen-codeine (TYLENOL #3) 300-30 MG per tablet Take 1 tablet by mouth every 8 (eight) hours as needed for moderate pain.     aspirin EC 81 MG tablet Take 81 mg by mouth 3 (three) times a week.     Bacillus Coagulans-Inulin (ALIGN PREBIOTIC-PROBIOTIC PO) Take by mouth.     carisoprodol (SOMA) 350 MG tablet Take 175 mg by mouth 3 (three) times daily as needed for muscle spasms.     Cholecalciferol (VITAMIN D-3) 1000 UNITS CAPS Take 2,000 Units by mouth every evening.     CO-ENZYME Q-10 PO Take 1 tablet by mouth every evening.      Cyanocobalamin (VITAMIN B12) 1000 MCG TBCR Take 1,000 mcg by mouth daily.     denosumab (PROLIA) 60 MG/ML SOSY injection Inject 60 mg into the skin every 6 (six) months. At [place]     diclofenac (VOLTAREN) 75 MG EC tablet Take 75 mg by mouth 2 (two) times daily as needed (pain.).  2   diclofenac sodium (VOLTAREN) 1 % GEL Apply 4 g topically 4 (four) times daily as needed (pain.).  12   dicyclomine (BENTYL) 10 MG capsule Take one by mouth every 6 hours as needed for abdominal cramping (Patient taking differently: Take 10 mg by mouth every 6 (six) hours as needed (abdominal spasms.).) 30 capsule 1   diphenoxylate-atropine (LOMOTIL) 2.5-0.025 MG tablet Take 1 tablet by mouth 4 (four) times daily as needed for diarrhea or loose stools.     ezetimibe (ZETIA) 10 MG tablet Take 10 mg by mouth daily.     Fluticasone Propionate (XHANCE) 93 MCG/ACT EXHU 1 spray in each nostril     Glucosamine Sulfate 500 MG TABS Take 500 mg by mouth daily.     HYDROcodone-acetaminophen (NORCO/VICODIN) 5-325 MG tablet Take 1-2 tablets by mouth every 4 (four) hours as needed for moderate pain. 24 tablet 0   leuprolide, 6 Month, (ELIGARD) 45 MG injection Inject 45 mg into the skin every 6 (six) months. Per Dr. Tresa Moore office     loratadine (CLARITIN) 10 MG tablet  Take 10 mg by mouth daily.     meclizine (ANTIVERT) 25 MG tablet Take 25 mg by mouth 3 (three) times daily as needed for dizziness.      methocarbamol (ROBAXIN) 500 MG tablet take 1 tab 4 times daily prn spasm     Multiple Vitamin (MULTIVITAMIN) tablet Take 1 tablet by mouth daily.     omeprazole (PRILOSEC) 40 MG capsule Take 1 capsule (40 mg total) by mouth daily. 30 capsule 11   ondansetron (ZOFRAN) 8 MG tablet Take 1 tablet (8 mg total) by mouth every 8 (eight) hours as needed for nausea or vomiting. Start 72 hours after IV chemotherapy administration 30 tablet 1   oxybutynin (DITROPAN) 5 MG tablet Take 5 mg by mouth every 8 (eight) hours as needed.     Pirfenidone (ESBRIET) 801 MG TABS Take 1 tablet by mouth 3 (three) times daily with meals. 270 tablet 1   prochlorperazine (COMPAZINE) 10 MG tablet Take 1 tablet (10 mg total) by  mouth every 6 (six) hours as needed for nausea. 60 tablet 1   No facility-administered medications prior to visit.     Review of Systems:   Constitutional: No weight loss or gain, night sweats, fevers, chills, fatigue, or lassitude. HEENT: No headaches, difficulty swallowing, tooth/dental problems, or sore throat. No sneezing, itching, no changes in hearing. +nasal congestion, occasional post nasal drip, left ear pressure CV:  No chest pain, orthopnea, PND, swelling in lower extremities, anasarca, dizziness, palpitations, syncope Resp: +shortness of breath with exertion (unchanged from baseline); rare dry cough. No excess mucus or change in color of mucus. No hemoptysis. No wheezing.  No chest wall deformity GI:  +occasional nausea (improves with zofran). No heartburn, indigestion, abdominal pain, vomiting, diarrhea, change in bowel habits, loss of appetite, bloody stools.  Skin: No rash, lesions, ulcerations MSK:  No joint pain or swelling.  No decreased range of motion.  No back pain. Neuro: No dizziness or lightheadedness.  Psych: No depression or anxiety. Mood stable.     Physical Exam:  BP 108/64 (BP Location: Left Arm, Cuff Size: Normal)   Pulse 69   Temp 97.9 F (36.6 C) (Oral)   Ht _0  (1.753 m)   Wt 170 lb (77.1 kg)   SpO2 98%   BMI 25.10 kg/m   GEN: Pleasant, interactive, well-appearing; elderly; in no acute distress. HEENT:  Normocephalic and atraumatic. EACs patent bilaterally. TM pearly gray with present light reflex bilaterally. PERRLA. Sclera white. Nasal turbinates pink, moist and patent bilaterally. Polyp left nostril. No rhinorrhea present. Oropharynx pink and moist, without exudate or edema. No lesions, ulcerations, or postnasal drip.  NECK:  Supple w/ fair ROM. No JVD present. Normal carotid impulses w/o bruits. Thyroid symmetrical with no goiter or nodules palpated. No lymphadenopathy.   CV: RRR, no m/r/g, no peripheral edema. Pulses intact, +2 bilaterally.  No cyanosis, pallor or clubbing. PULMONARY:  Unlabored, regular breathing. Bibasilar crackles; otherwise clear bilaterally A&P w/o wheezes/rales/rhonchi. No accessory muscle use. No dullness to percussion. GI: BS present and normoactive. Soft, non-tender to palpation. No organomegaly or masses detected. No CVA tenderness. MSK: No erythema, warmth or tenderness. Cap refil <2 sec all extrem. No deformities or joint swelling noted.  Neuro: A/Ox3. No focal deficits noted.   Skin: Warm, no lesions or rashe Psych: Normal affect and behavior. Judgement and thought content appropriate.     Lab Results:  CBC    Component Value Date/Time   WBC 6.2 01/09/2022 1108   RBC  3.76 (L) 01/09/2022 1108   HGB 12.2 (L) 01/09/2022 1108   HGB 11.3 (L) 11/20/2021 0838   HCT 35.6 (L) 01/09/2022 1108   PLT 209.0 01/09/2022 1108   PLT 207 11/20/2021 0838   MCV 94.6 01/09/2022 1108   MCH 32.4 11/20/2021 0838   MCHC 34.3 01/09/2022 1108   RDW 13.4 01/09/2022 1108   LYMPHSABS 0.5 (L) 01/09/2022 1108   MONOABS 0.7 01/09/2022 1108   EOSABS 0.2 01/09/2022 1108   BASOSABS 0.0 01/09/2022 1108    BMET    Component Value Date/Time   NA 133 (L) 01/09/2022 1108   K 4.4 01/09/2022 1108   CL 99 01/09/2022 1108   CO2 28 01/09/2022 1108   GLUCOSE 100 (H) 01/09/2022 1108   BUN 13 01/09/2022 1108   CREATININE 0.66 01/09/2022 1108   CREATININE 0.64 11/20/2021 0838   CALCIUM 9.0 01/09/2022 1108   GFRNONAA >60 11/20/2021 0838   GFRAA >60 06/01/2017 2144    BNP No results found for: "BNP"   Imaging:  No results found.       Latest Ref Rng & Units 01/09/2022    9:17 AM 09/18/2021    8:52 AM 04/03/2021    9:21 AM 10/21/2020    8:56 AM 04/11/2020   10:04 AM 10/04/2019    9:32 AM 04/21/2019   11:06 AM  PFT Results  FVC-Pre L 2.49  P 2.68  2.96  3.20  3.26  3.35  3.59  P  FVC-Predicted Pre % 64  P 68  75  81  82  83  90  P  Pre FEV1/FVC % % 87  P 84  81  81  80  82  81  P  FEV1-Pre L 2.16  P 2.25  2.38   2.59  2.62  2.74  2.92  P  FEV1-Predicted Pre % 78  P 80  85  91  92  95  102  P  DLCO uncorrected ml/min/mmHg 12.55  P 13.07  16.15  13.64  17.26  16.69  20.89  P  DLCO UNC% % 53  P 54  67  56  72  69  86  P  DLCO corrected ml/min/mmHg 12.55  P 14.64  16.15  13.64  17.26  17.31    DLCO COR %Predicted % 53  P 61  67  56  72  71    DLVA Predicted % 91  P 105  91  80  94  91  109  P    P Preliminary result    No results found for: "NITRICOXIDE"      Assessment & Plan:   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HCC) Stable without change in his breathing. Overall, he is feeling well and without any significant concerns today. Feels as though his decline in his PFTs were related to his nasal congestion, which has improved some and back to his baseline. Symptom score 2 weeks ago stable. Tolerating pirfenidone well with stable weight. Walking oximetry at last visit was without desaturations on room air. His oxygen has been in the 90's at home. Plans for repeat spiro/DLCO in early October.   Patient Instructions  Continue Pirfenidone 801 mg (1 tab) Three times a day with food. Wear sunscreen when outside Continue fluticasone nasal spray 1 spray each nostril daily Continue claritin 1 tab daily  Continue zofran 1 tab every 8 hours as needed for nausea or vomiting   Follow up with [name] once you get your appointment.  Repeat your pulmonary testing as scheduled in October  Follow up with Dr. [name] 10/3 as scheduled. If symptoms do not improve or worsen, please contact office for sooner follow up or seek emergency care.     Chronic sinusitis Worsening sinusitis at previous OV; treated with steroid taper. Improved symptoms. He has chronic sinusitis and hx of nasal polyps. There is a polyp in his left nostril upon my exam. He is currently in the process of scheduling an appt with [name] with ENT. No changes to current regimen made today.   Eustachian tube dysfunction, left Sensation  of pressure left ear. Denies any pain or hearing changes. Appears unremarkable on otoscopic exam today. Follow up with ENT for further eval.   I spent 28 minutes of dedicated to the care of this patient on the date of this encounter to include pre-visit review of records, face-to-face time with the patient discussing conditions above, post visit ordering of testing, clinical documentation with the electronic health record, making appropriate referrals as documented, and communicating necessary findings to members of the patients care team.  Clayton Bibles, NP 01/26/2022  Pt aware and understands NP's role.

## 2022-01-26 NOTE — Patient Instructions (Addendum)
Continue Pirfenidone 801 mg (1 tab) Three times a day with food. Wear sunscreen when outside Continue fluticasone nasal spray 1 spray each nostril daily Continue claritin 1 tab daily  Continue zofran 1 tab every 8 hours as needed for nausea or vomiting   Follow up with [name] once you get your appointment.   Repeat your pulmonary testing as scheduled in October  Follow up with Dr. [name] 10/3 as scheduled. If symptoms do not improve or worsen, please contact office for sooner follow up or seek emergency care.

## 2022-01-26 NOTE — Assessment & Plan Note (Signed)
Sensation of pressure left ear. Denies any pain or hearing changes. Appears unremarkable on otoscopic exam today. Follow up with ENT for further eval.

## 2022-01-27 ENCOUNTER — Other Ambulatory Visit: Payer: Self-pay

## 2022-01-28 ENCOUNTER — Telehealth: Payer: Self-pay | Admitting: Internal Medicine

## 2022-01-28 DIAGNOSIS — J84112 Idiopathic pulmonary fibrosis: Secondary | ICD-10-CM

## 2022-01-28 MED ORDER — PIRFENIDONE 801 MG PO TABS: 1.0000 | ORAL_TABLET | Freq: Three times a day (TID) | ORAL | 1 refills | Status: DC

## 2022-01-28 NOTE — Telephone Encounter (Signed)
Refill sent for ESBRIET to [facility] (pulmonary fibrosis team). [phone]  Dose: 801 mg three times daily  Last OV: 01/26/22 Provider: Dr. [name], last seen by Roxan Diesel, NP  Next OV: 04/07/22  LFTs on 01/09/22 wnl  Knox Saliva, PharmD, MPH, BCPS Clinical Pharmacist (Rheumatology and Pulmonology)

## 2022-02-26 ENCOUNTER — Inpatient Hospital Stay (HOSPITAL_BASED_OUTPATIENT_CLINIC_OR_DEPARTMENT_OTHER): Payer: PPO | Admitting: Oncology

## 2022-02-26 ENCOUNTER — Inpatient Hospital Stay: Payer: PPO | Attending: Oncology

## 2022-02-26 VITALS — BP 147/80 | HR 86 | Temp 98.2°F | Resp 18 | Ht 69.0 in | Wt 168.6 lb

## 2022-02-26 DIAGNOSIS — R109 Unspecified abdominal pain: Secondary | ICD-10-CM | POA: Insufficient documentation

## 2022-02-26 DIAGNOSIS — J84112 Idiopathic pulmonary fibrosis: Secondary | ICD-10-CM | POA: Diagnosis not present

## 2022-02-26 DIAGNOSIS — R0600 Dyspnea, unspecified: Secondary | ICD-10-CM | POA: Insufficient documentation

## 2022-02-26 DIAGNOSIS — R21 Rash and other nonspecific skin eruption: Secondary | ICD-10-CM | POA: Insufficient documentation

## 2022-02-26 DIAGNOSIS — R110 Nausea: Secondary | ICD-10-CM | POA: Diagnosis not present

## 2022-02-26 DIAGNOSIS — C61 Malignant neoplasm of prostate: Secondary | ICD-10-CM | POA: Insufficient documentation

## 2022-02-26 DIAGNOSIS — K589 Irritable bowel syndrome without diarrhea: Secondary | ICD-10-CM | POA: Insufficient documentation

## 2022-02-26 DIAGNOSIS — C8330 Diffuse large B-cell lymphoma, unspecified site: Secondary | ICD-10-CM

## 2022-02-26 DIAGNOSIS — J342 Deviated nasal septum: Secondary | ICD-10-CM | POA: Diagnosis not present

## 2022-02-26 DIAGNOSIS — K648 Other hemorrhoids: Secondary | ICD-10-CM | POA: Insufficient documentation

## 2022-02-26 DIAGNOSIS — C8331 Diffuse large B-cell lymphoma, lymph nodes of head, face, and neck: Secondary | ICD-10-CM | POA: Diagnosis not present

## 2022-02-26 DIAGNOSIS — R5381 Other malaise: Secondary | ICD-10-CM | POA: Insufficient documentation

## 2022-02-26 DIAGNOSIS — J329 Chronic sinusitis, unspecified: Secondary | ICD-10-CM | POA: Diagnosis not present

## 2022-02-26 DIAGNOSIS — Z79899 Other long term (current) drug therapy: Secondary | ICD-10-CM | POA: Insufficient documentation

## 2022-02-26 LAB — CMP (CANCER CENTER ONLY)
ALT: 15 U/L (ref 0–44)
AST: 19 U/L (ref 15–41)
Albumin: 4.1 g/dL (ref 3.5–5.0)
Alkaline Phosphatase: 42 U/L (ref 38–126)
Anion gap: 7 (ref 5–15)
BUN: 11 mg/dL (ref 8–23)
CO2: 26 mmol/L (ref 22–32)
Calcium: 8.8 mg/dL — ABNORMAL LOW (ref 8.9–10.3)
Chloride: 99 mmol/L (ref 98–111)
Creatinine: 0.73 mg/dL (ref 0.61–1.24)
GFR, Estimated: >60 mL/min (ref >60)
Glucose, Bld: 145 mg/dL — ABNORMAL HIGH (ref 70–99)
Potassium: 4.2 mmol/L (ref 3.5–5.1)
Sodium: 132 mmol/L — ABNORMAL LOW (ref 135–145)
Total Bilirubin: 0.4 mg/dL (ref 0.3–1.2)
Total Protein: 6.7 g/dL (ref 6.5–8.1)

## 2022-02-26 LAB — CBC WITH DIFFERENTIAL (CANCER CENTER ONLY)
Abs Immature Granulocytes: 0.01 10*3/uL (ref 0.00–0.07)
Basophils Absolute: 0 10*3/uL (ref 0.0–0.1)
Basophils Relative: 1 %
Eosinophils Absolute: 0.3 10*3/uL (ref 0.0–0.5)
Eosinophils Relative: 8 %
HCT: 33.7 % — ABNORMAL LOW (ref 39.0–52.0)
Hemoglobin: 11.8 g/dL — ABNORMAL LOW (ref 13.0–17.0)
Immature Granulocytes: 0 %
Lymphocytes Relative: 15 %
Lymphs Abs: 0.7 10*3/uL (ref 0.7–4.0)
MCH: 31.7 pg (ref 26.0–34.0)
MCHC: 35 g/dL (ref 30.0–36.0)
MCV: 90.6 fL (ref 80.0–100.0)
Monocytes Absolute: 0.5 10*3/uL (ref 0.1–1.0)
Monocytes Relative: 11 %
Neutro Abs: 2.8 10*3/uL (ref 1.7–7.7)
Neutrophils Relative %: 65 %
Platelet Count: 199 10*3/uL (ref 150–400)
RBC: 3.72 MIL/uL — ABNORMAL LOW (ref 4.22–5.81)
RDW: 12.9 % (ref 11.5–15.5)
WBC Count: 4.3 10*3/uL (ref 4.0–10.5)
nRBC: 0 % (ref 0.0–0.2)

## 2022-02-26 LAB — LACTATE DEHYDROGENASE: LDH: 203 U/L — ABNORMAL HIGH (ref 98–192)

## 2022-02-26 NOTE — Progress Notes (Signed)
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  No palpable lymph nodes.  No fever or night sweats.  He has intermittent nausea and irregular bowel habits.  He relates the nausea to pirfenidone.  He has persistent dyspnea.  He had an episode of acute left abdominal pain earlier this week.  This has improved.  He reports having a sinus infection in July.  He continues to have malaise.  He relates the malaise to androgen deprivation therapy. He has intermittent rash at the lower legs. Objective:  Vital signs in last 24 hours:  Blood pressure (!) 147/80, pulse 86, temperature 98.2 F (36.8 C), temperature source Oral, resp. rate 18, height _0  (1.753 m), weight 168 lb 9.6 oz (76.5 kg), SpO2 96 %.    HEENT: Neck without mass Lymphatics: No cervical, supraclavicular, axillary, or inguinal nodes Resp: Inspiratory rales at the right greater than left posterior chest, no respiratory distress Cardio: Regular rate and rhythm GI: No hepatosplenomegaly, no mass, nontender, no apparent hernia Vascular: No leg edema  Skin: Erythematous slightly raised lesions at the lower legs bilaterally  Lab Results:  Lab Results  Component Value Date   WBC 4.3 02/26/2022   HGB 11.8 (L) 02/26/2022   HCT 33.7 (L) 02/26/2022   MCV 90.6 02/26/2022   PLT 199 02/26/2022   NEUTROABS 2.8 02/26/2022    CMP  Lab Results  Component Value Date   NA 133 (L) 01/09/2022   K 4.4 01/09/2022   CL 99 01/09/2022   CO2 28 01/09/2022   GLUCOSE 100 (H) 01/09/2022   BUN 13 01/09/2022   CREATININE 0.66 01/09/2022   CALCIUM 9.0 01/09/2022   PROT 7.0 01/09/2022   ALBUMIN 4.4 01/09/2022   AST 22 01/09/2022   ALT 18 01/09/2022   ALKPHOS 43 01/09/2022   BILITOT 0.4 01/09/2022   GFRNONAA >60 11/20/2021   GFRAA >60 06/01/2017     Medications: I have reviewed the patient's current  medications.   Assessment/Plan: Non-Hodgkin's lymphoma CTs neck and chest 03/19/2021-cluster of left supraclavicular nodes measuring less than 1 cm, 12 mm right node next to the jugular vein, chest with slight interval progression of pulmonary fibrosis, new small sclerotic lesion in the right third rib concerning for metastasis, lucent lesion in T8 corresponding to abnormal uptake on a bone scan 05/21/2020 suspicious for malignancy, subpectoral nodes-subcentimeter in size but larger than 03/28/2019 FNA of left neck node 03/25/2021-atypical lymphoid proliferation Excisional biopsy of level 5 left neck node 04/07/2021-lymph node with marked autolysis and diffusely infiltrated by atypical lymphocytes, 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 Cycle 1 R-mini CHOP 07/10/2021 Cycle 2 R-mini CHOP  07/31/2021 Cycle 3 R-mini CHOP 08/21/2021 Cycle 4 R-mini CHOP 09/11/2021 PET 09/29/2021-near complete  resolution of hypermetabolic lymph nodes, no remaining hypermetabolic disease, wall thickening and hypermetabolic activity at the transverse and sigmoid colon Cycle 5 R-mini CHOP 10/02/2021 Cycle 6 R-mini CHOP 10/23/2021, Udenyca 10/24/2021 Prostate cancer-T3 adenocarcinoma, Gleason 5+4, external beam XRT 09/12/2020 - 11/07/2020, maintained on 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  He will follow-up with dermatology to evaluate the lower leg skin lesions.  I doubt this is related to lymphoma or chemotherapy.  He will return for an office visit in 3 months.  He will call in the interim for new symptoms.  [name], MD  02/26/2022  9:07 AM

## 2022-03-06 DIAGNOSIS — J329 Chronic sinusitis, unspecified: Secondary | ICD-10-CM | POA: Diagnosis not present

## 2022-03-06 DIAGNOSIS — S0231XA Fracture of orbital floor, right side, initial encounter for closed fracture: Secondary | ICD-10-CM | POA: Diagnosis not present

## 2022-03-06 DIAGNOSIS — J342 Deviated nasal septum: Secondary | ICD-10-CM | POA: Diagnosis not present

## 2022-03-09 ENCOUNTER — Other Ambulatory Visit: Payer: Self-pay | Admitting: Oncology

## 2022-03-10 DIAGNOSIS — L821 Other seborrheic keratosis: Secondary | ICD-10-CM | POA: Diagnosis not present

## 2022-03-10 DIAGNOSIS — L578 Other skin changes due to chronic exposure to nonionizing radiation: Secondary | ICD-10-CM | POA: Diagnosis not present

## 2022-03-10 DIAGNOSIS — K130 Diseases of lips: Secondary | ICD-10-CM | POA: Diagnosis not present

## 2022-03-10 DIAGNOSIS — D225 Melanocytic nevi of trunk: Secondary | ICD-10-CM | POA: Diagnosis not present

## 2022-03-10 DIAGNOSIS — L570 Actinic keratosis: Secondary | ICD-10-CM | POA: Diagnosis not present

## 2022-03-10 DIAGNOSIS — Z85828 Personal history of other malignant neoplasm of skin: Secondary | ICD-10-CM | POA: Diagnosis not present

## 2022-03-10 DIAGNOSIS — L309 Dermatitis, unspecified: Secondary | ICD-10-CM | POA: Diagnosis not present

## 2022-03-24 DIAGNOSIS — J320 Chronic maxillary sinusitis: Secondary | ICD-10-CM | POA: Diagnosis not present

## 2022-03-26 DIAGNOSIS — E785 Hyperlipidemia, unspecified: Secondary | ICD-10-CM | POA: Diagnosis not present

## 2022-03-26 DIAGNOSIS — I2510 Atherosclerotic heart disease of native coronary artery without angina pectoris: Secondary | ICD-10-CM | POA: Diagnosis not present

## 2022-03-26 DIAGNOSIS — R7989 Other specified abnormal findings of blood chemistry: Secondary | ICD-10-CM | POA: Diagnosis not present

## 2022-03-26 DIAGNOSIS — R7301 Impaired fasting glucose: Secondary | ICD-10-CM | POA: Diagnosis not present

## 2022-03-26 DIAGNOSIS — Z125 Encounter for screening for malignant neoplasm of prostate: Secondary | ICD-10-CM | POA: Diagnosis not present

## 2022-04-02 DIAGNOSIS — Z1331 Encounter for screening for depression: Secondary | ICD-10-CM | POA: Diagnosis not present

## 2022-04-02 DIAGNOSIS — E785 Hyperlipidemia, unspecified: Secondary | ICD-10-CM | POA: Diagnosis not present

## 2022-04-02 DIAGNOSIS — Z23 Encounter for immunization: Secondary | ICD-10-CM | POA: Diagnosis not present

## 2022-04-02 DIAGNOSIS — Z1389 Encounter for screening for other disorder: Secondary | ICD-10-CM | POA: Diagnosis not present

## 2022-04-02 DIAGNOSIS — I2510 Atherosclerotic heart disease of native coronary artery without angina pectoris: Secondary | ICD-10-CM | POA: Diagnosis not present

## 2022-04-02 DIAGNOSIS — R82998 Other abnormal findings in urine: Secondary | ICD-10-CM | POA: Diagnosis not present

## 2022-04-02 DIAGNOSIS — J84112 Idiopathic pulmonary fibrosis: Secondary | ICD-10-CM | POA: Diagnosis not present

## 2022-04-02 DIAGNOSIS — I272 Pulmonary hypertension, unspecified: Secondary | ICD-10-CM | POA: Diagnosis not present

## 2022-04-02 DIAGNOSIS — R7301 Impaired fasting glucose: Secondary | ICD-10-CM | POA: Diagnosis not present

## 2022-04-02 DIAGNOSIS — E538 Deficiency of other specified B group vitamins: Secondary | ICD-10-CM | POA: Diagnosis not present

## 2022-04-02 DIAGNOSIS — M810 Age-related osteoporosis without current pathological fracture: Secondary | ICD-10-CM | POA: Diagnosis not present

## 2022-04-02 DIAGNOSIS — Z0000 Encounter for general adult medical examination without abnormal findings: Secondary | ICD-10-CM | POA: Diagnosis not present

## 2022-04-07 ENCOUNTER — Encounter: Payer: Self-pay | Admitting: Internal Medicine

## 2022-04-07 ENCOUNTER — Encounter: Payer: PPO | Admitting: Internal Medicine

## 2022-04-07 ENCOUNTER — Telehealth: Payer: Self-pay | Admitting: Internal Medicine

## 2022-04-07 ENCOUNTER — Ambulatory Visit (INDEPENDENT_AMBULATORY_CARE_PROVIDER_SITE_OTHER): Payer: PPO | Admitting: Internal Medicine

## 2022-04-07 ENCOUNTER — Other Ambulatory Visit (HOSPITAL_COMMUNITY): Payer: Self-pay | Admitting: *Deleted

## 2022-04-07 VITALS — BP 112/60 | HR 90 | Temp 98.2°F | Ht 68.0 in | Wt 173.0 lb

## 2022-04-07 DIAGNOSIS — R5383 Other fatigue: Secondary | ICD-10-CM | POA: Diagnosis not present

## 2022-04-07 DIAGNOSIS — J84112 Idiopathic pulmonary fibrosis: Secondary | ICD-10-CM

## 2022-04-07 DIAGNOSIS — I288 Other diseases of pulmonary vessels: Secondary | ICD-10-CM | POA: Diagnosis not present

## 2022-04-07 DIAGNOSIS — Z5181 Encounter for therapeutic drug level monitoring: Secondary | ICD-10-CM | POA: Diagnosis not present

## 2022-04-07 DIAGNOSIS — Z006 Encounter for examination for normal comparison and control in clinical research program: Secondary | ICD-10-CM

## 2022-04-07 LAB — PULMONARY FUNCTION TEST
DL/VA % pred: 89 %
DL/VA: 3.52 ml/min/mmHg/L
DLCO cor % pred: 59 %
DLCO cor: 13.57 ml/min/mmHg
DLCO unc % pred: 53 %
DLCO unc: 12.36 ml/min/mmHg
FEF 25-75 Pre: 3.38 L/sec
FEF2575-%Pred-Pre: 188 %
FEV1-%Pred-Pre: 83 %
FEV1-Pre: 2.18 L
FEV1FVC-%Pred-Pre: 120 %
FEV6-%Pred-Pre: 73 %
FEV6-Pre: 2.53 L
FEV6FVC-%Pred-Pre: 107 %
FVC-%Pred-Pre: 68 %
FVC-Pre: 2.53 L
Pre FEV1/FVC ratio: 86 %
Pre FEV6/FVC Ratio: 100 %

## 2022-04-07 NOTE — Progress Notes (Signed)
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 He tells me that this summer 2018*noticing insidious onset of shortness of breath particularly with walking stairs in the house or [place]. This was not that in the previous years. The summer 2018 he was in [place] but did not notice much shortness of breath. Otherwise he is able to do his activities of daily living and played golf with a car. He is more bothered by his back pain and sciatica as a result of previous back surgery. Shortness of breath is extremely mild. He says he became more aware of it after the radiologic investigations documented below. He had a chest x-ray that suggested interstitial findings and therefore he underwent a high-resolution CT scan of the chest that is described below. I personally visualized this high resolution CT chest and to me it shows bilateral bibasal subpleural reticulation that also extends to the upper lobes without any zonal predominance. There is no obvious honeycombing but there is traction bronchiectasis. There no mediastinal adenopathy. Therefore he is been referred here. In terms of exposure history other than radiation exposure he has not been on any pulmonary toxic drugs or any mold exposure or asbestos exposure. Does have GERD and is on PPI and is well ocntrolled   Does have spring and perennial allergies - sneezes easy. Allergy test with [facility] negative some years ago  [employer] chest physicians interstitial lung disease questionnaire: He says that he does not cough. He is only trouble with dyspnea with strenuous exercise and it started 4 months ago. Past medical history significant for acid reflux. He has never smoked any tobacco or street drugs. Does not have any family history of lung disease. At  home he does not have any humidifier sound birds heart the water damage or mold. 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 He has standard radiation exposure while at work. The standard radiation for a radiologist. He does not take any pulmonary toxic drugs. House is 80 years old and he worries about mold. He sneezes if air is forced   Pulmonary function test shows isolated reduction in diffusion capacity to 62%. Walking desaturation test underneath her feet 3 laps on room air: Resting pulse ox 96%. Final pulse ox 95%. Heart rate resting was 92/m and went up to 109 minute.   Results for Kenneth [name], Kenneth [name] (MRN [id number]) as of 03/23/2017 10:04  Ref. Range 03/04/2017 09:13  FVC-Pre Latest Units: L 3.59  FVC-%Pred-Pre Latest Units: % 86  Results for Kenneth [name], SALOIS (MRN [id number]) as of 03/23/2017 10:04  Ref. Range 03/04/2017 09:13  DLCO cor Latest Units: ml/min/mmHg 20.06  DLCO cor % pred Latest Units: % 63    Lungs/Pleura: There is subpleural reticulation with scattered ground-glass and traction bronchiolectasis, without a definite zonal predominance. No definitive honeycombing. Findings appear mildly progressive when radiographs dating back to 12/28/2008 are reviewed. Probable 5 mm subpleural lymph node along the right major fissure. No pleural fluid. Airway is unremarkable.      IMPRESSION: 1. Pulmonary parenchymal findings of interstitial lung disease which may be due to fibrotic nonspecific interstitial pneumonitis or, given slight progression over time, usual interstitial pneumonitis. 2. Aortic atherosclerosis (ICD10-170.0). Coronary artery calcification. 3. Aortic aneurysm NOS (ICD10-I71.9).  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 Recommend annual imaging followup by CTA or MRA. This recommendation follows 2010 ACCF/AHA/AATS/ACR/ASA/SCA/SCAI/SIR/STS/SVM Guidelines for the Diagnosis and Management of Patients with Thoracic  Aortic Disease. Circulation. 2010; 121: T614-E315. 4. Prominence of the right and left main pulmonary arteries can be seen with pulmonary arterial hypertension.     Electronically Signed   By: [name]   On: 02/17/2017 13:41      OV 03/23/2017  Chief Complaint  Patient presents with   Follow-up    SOB on exertion. Other than that he has been doing about the same. Denies any cough or CP.   Here to discuss results. [name]. 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 In inteirm no new symptoms.  Autoimmune profile negative. They raise possibility of 2nd opinion.  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 Progression is based on CXR comparison since 2010; there is no CT and is only suspicion of progression  Results for Kenneth [name], Kenneth [name] (MRN [id number]) as of 03/23/2017 10:04  Ref. Range 12/08/2011 16:44 03/04/2017 13:47  Anit Nuclear Antibody(ANA) Latest Ref Range: NEGATIVE   NEG  Angiotensin-Converting Enzyme Latest Ref Range: 9 - 67 U/L  31  Cyclic Citrullin Peptide Ab Latest Units: Units  <16  ds DNA Ab Latest Units: IU/mL  <1  ENA RNP Ab Latest Ref Range: 0.0 - 0.9 AI  0.2  Endomysial Screen Latest Ref Range: NEGATIVE  NEGATIVE   Deamidated Gliadin Abs, IgG Latest Ref Range: <20 U/mL 4.1   Gliadin IgA Latest Ref Range: <20 U/mL 1.8   RA Latex Turbid. Latest Ref Range: <14 IU/mL  <14  Tissue Transglut Ab Latest Ref Range: <20 U/mL 3.9   Tissue Transglutaminase Ab, IgA Latest Ref Range: <20 U/mL 2.2   IgE (Immunoglobulin E), Serum Latest Ref Range: <115 kU/L  31  IgA Latest Ref Range: 68 - 379 mg/dL 132   SSA (Ro) (ENA) Antibody, IgG Latest Ref Range: <1.0 NEG AI   <1.0 NEG  SSB (La) (ENA) Antibody, IgG Latest Ref Range: <1.0 NEG AI   <1.0 NEG    OV 05/06/2017  Chief Complaint  Patient presents with   Follow-up    Follow up today after beginning Esbriet. Pt has no c/o SOB,  cough, or CP. Has had some mild nausea from the [place] but other than that, he has been doing good on it.   ollow-up idiopathic pulmonary fibrosis mild severity  He is here to follow-up He is on the third week of his bed at 3 pills 3 times a day max dose. So far is tolerating it fine other than mild nausea. He does take after meals. He plans to meet with [facility] coordinator for medication support. He did visit [place] this past weekend and did have vomiting especially after consuming few etoh drinks at a [employer]. He does apply sunscreen [his daughter is a Paediatric nurse in [place]. He still complains about the fact that and is 80 year old home when the air conditioner comes on and they air blows out of the duct system he does sneeze 10 times and his feet does feel cold. She plans to put hepa filters that are high and and also get the duct cleaning. He says he did have allergy evaluation and found few years ago and it was negative. Recently he has increased his rpinrole and alsogot an injection  for his back and he does feel better   OV 06/22/2017  Chief Complaint  Patient presents with   Follow-up    Pt still taking Esbriet and has been doing good except has been having issues with nausea x3 days. PFT done today. Still becomes SOB with exertion. Denies any complaints of cough or CP.   IPF followup  Routine followup. Now on esbriet 3 pills tid since end oct 2018. Having new nausea - moderate, x 3 days. Might be chills. But no associated diarrhea or other side effects. Spacing esbriet 4h only. Also has complaitns about high co pay. Unable to find charity. Doing exrcise with trainer - feeels that is better and more vigorous than rehab. Gets tachy with eercise but says he does not desaturate. Had PFT - stable. Lot of questions about disease    Walking desaturation test on 06/22/2017 185 feet x 3 laps on ROOM AIR:  did NOT desaturate. Rest pulse ox was 100%, final pulse ox was 99%. HR  response 84/min at rest to 97/min at peak exertion. Patient GERRITT GALENTINE  Did not Desaturate < 88% . Kenneth [name] did not  Desaturated </= 3% points. Kenneth [name] yes did get tachyardic   OV 09/20/2017  Chief Complaint  Patient presents with   Follow-up    PFT done today.  Pt states he has been doing good. Pt still taking Esbriet and states he is doing good on it.   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  Joen Laura for IPF follow-up.  Last visit December 2018.  Since then he has been compliant fully with his Pirfenidone (Esbriet).  His nausea resolved with Ginger intake.  However he is having 3-5 pound weight loss and also fatigue towards the end of the day and also low appetite.  He is exercising vigorously 5 times a week and he is wondering if the fatigue could be because of the heavy exercise.  He is not interested in lung transplantation.  He is open to research participation in interventional trial.  Currently he is on the IPF registry program.  Overall he is well.  He did go to [place] to visit his son and he did not wear sunscreen and he did pick up a stage I skin burn with erythema that was more than usual.  But this resolved.  I did remind him about his obligation to wear sunscreen at all times with Pirfenidone (Esbriet).  He is interested in interventional research trials.  His pulmonary function test to me on an average is stable even though there is decline in FVC that seems to be an increase in DLCO so that is variability.  His walking desaturation test is around the same. He is interested in rolling over to the larger capsule of the Pirfenidone (Esbriet) so we will have to take it only 3 times a day. ESberit is cposting him $500/month or more due to high AGI and this is frustrating for him   Walking desaturation test on 09/20/2017 185 feet x 3 laps on ROOM AIR:  did walk briskly all 3 laps with only mild dyspnea desaturate. Rest pulse ox was 98%, final pulse ox was 98%. HR response 96/min at rest to  107/min at peak exertion. Patient JOSEF TOURIGNY  Did not Desaturate < 88% . Kenneth [name] did not  Desaturated </= 3% points. Guillerm[place] yes did get tachyardic this appears similar to a few months ago   OV 12/07/2017  Chief Complaint  Patient  presents with   Follow-up    Pt had pre Arlyce Harman and DLCO prior to OV today. Pt has increase of SOB with exertion more with walking up the hill.   Dr. Annamaria Boots presents for follow-up of his IPF to ILD clinic.  In terms of his IPF he feels stable.  In fact he tells me that if not for the incidental chest x-ray and a subsequent CT scan that picked up pulmonary fibrosis he even to this day will not know that he has IPF.  He only has a mild exertional dyspnea class I which she always believes in the past that it was because of aging.  At this point in time he is on Esbriet 1 big pill 3 times daily at full dose.  He is now applying sunscreen and has not had any further sunburns.  He is following an extremely intense exercise fitness regimen to improve his cardiac conditioning.  He believes his exertional heart rate and resting heart rate are much improved than before.  He works with a Clinical research associate at [employer].  His main issue is from Pirfenidone [facility]).  He had some mild nausea that has now resolved with ginger capsules but he continues to have some amount of fatigue low appetite and some mild weight loss.  He says this is mild and all tolerable.  The fatigue happens after working a lot in the yard and he loves yard work.  He wants to know if he can continue to do yard work.  Recently he did have some sinus congestion and took some steroids and antibiotics for it and he feels better but he still has some sinus fullness.  He had spirometry and simple walking desaturation test and the profile is below and I believe these are stable.  In fact his exertional heart rate is improved compared to previous.  Today he also participated in the IPF registry program.  There is a  noninterventional trial.  He is interested in future interventional trials.   OV 03/08/2018  Chief Complaint  Patient presents with   Follow-up    PFT performed today. Pt states he has been doing well except he has been having problems with nausea and dizziness, and loss of appetite.   Kenneth [name] - presents for follow-up. For his IPF. He is on esbruet now. He was tolerating his Pirfenidone (Esbriet) quite well other than minor side effects till last visit. However in thesummer of 2019e's had increased nauseaand also weight loss. He has lost 6 pounds since prior visit. Overall he is lost 11 pounds since September 2018. We started the medication esbeit  anti-fibrotic for him in October 2018. His pants are getting looser.he is also having significant fatigue. Nevertheless he does not want to switch to the of anti-fibrotic ofev because of prior history of irritable bowel syndrome. He says that his baseline irritable bowel syndrome is worse than any side effects he is having with esbriet especially the fatigue.e is open to a transplant evaluation and consideration of research protocols. His spirometry shows stability with FVC but his DLCO might be declining.Marland Kitchen His simple walk test did not show anychange. He is not noticing any subjective changes in dyspnea on effort tolerance while working out although he does admit to increased dizziness particularly when playing golf. During this time recently has not monitored his pulse ox but in the past and never went below 92%.  Other issues: He had some myalgia despite change in his statin. He will  address this with his primary care. 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 Primary care has ordered a follow-up CT scan.This can be combined with a high risk CT scan. I I have reached out to  the thoracic radiology     OV 05/10/2018  Subjective:  Patient ID: Kenneth [name], male , DOB: 1941/01/18 , age 89 y.o. , MRN: [id number] , ADDRESS: [place] [address]   05/10/2018 -   Chief Complaint  Patient presents with   Follow-up    review spiro with dlco.  c/o stable doe.  On 885m Esbriet TID, notes occ dizziness, loss of appetite.      HPI Kenneth [id number].o. -returns-year follow-up.  Diagnosis made towards the end of 2018.  He has been on Esbriet since then.  His main issues have been weight loss.  He lost 10 pounds of weight at the time of last visit.  At the last visit he weighed 174 pounds in September 2019.  Back in August 2018 he weighed 186 pounds.  All this weight loss happen after he started Pirfenidone (Esbriet).  However he is now stabilized and he continues to be 874 pounds.  He is able to do activities of daily living and exercise but when he does stairs he gets dyspneic.  Overall he feels stable in the last 1 year in terms of his effort tolerance.  His main thing is that he has no appetite.  He has been communicating with the Pirfenidone (Esbriet) supporting about how to manage his low appetite and occasional nausea.  We discussed the options about taking brief holidays or reduction in dose.  He is willing to try this.  He does take occasional ginger.  Of note he had [facility] because he was out of network.  He tells me that his insurance company only advised that he would have a higher co-pay so is a little bit puzzled by this.  Since then he said conversation with his primary care physician and he wants to see Dr. DShana Chutewho is a son-in-law to his friend Dr. BLindwood Quaretired sPsychologist, sport and exercise  I know Dr. GLauris Chromanquite well from an IPF standpoint.  And therefore wholeheartedly supported the idea.  In terms of his lung function testing his FVC shows a decline although he is feeling the same.  On miniature walking test he seems a little more tachycardic but he says he feels the same.  He manages this heart rate with his exercise.  His  DLCO with some variability appears stable.  He had a CT angiogram for thoracic aortic aneurysm in September 2019 and this when compared to one year earlier has been reported as stable but the different resolutions.  I offered to do a high-resolution CT chest here but he wants to hold off until he saw Dr. GLauris Chroman  OV 08/23/2018  Subjective:  Patient ID: Kenneth [name] male , DOB: 406-15-1942, age 81y.o. , MRN: [id number], [place] [id number]  08/23/2018 -   Chief Complaint  Patient presents with   Follow-up    PFT performed today. Pt states he has been doing well since last visit and denies any complaints.   IPF followup"   Diagnosis made towards the end of 2018.  He has been on Esbriet since then.   HPI KKeldon Lassen  [name] 81 y.o. -Dr. Ronny Flurry presents for follow-up of his IPF.  He continues to feel good.  His main issue is tolerating the Esbriet which gives some fatigue.  Sometimes he has to postpone it.  In terms of his dyspnea he continues to exercise.  His dyspnea symptom score is really minimal.  His cough is nonexistent.  He was losing weight with Esbriet but now he is gained his weight back.  In the interim he did see Dr. Shana Chute June 23, 2018 at [facility].  He had a satisfactory visit.  He thought he was stable.  His next visit with Dr. Lauris Chroman will be in June 2020.  Moving forward he wants to alternate every 6 months between myself and Dr. Lauris Chroman at [facility] which will give him 3 months with 1 pulmonologist or the other.  He is beginning to get interested in research studies.  We did pulmonary function test on him today and is documented below.  The FVC appears a fluctuant but the DLCO definitely appears on a downward trend.  The new DLCO is on a global lung initiated [GL I] equation and therefore the percent predicted is higher.  But the absolute value showed downward trend line.  We went over this.  He definitely does not feel this subjectively.  His  last high-resolution CT scan of the chest was in August of 2018.  He is willing to have one at follow-up.  He wants to see me again in 6 months.  His simple walking desaturation test appears stable.  He is thinking of making a trip to [place] and wants to know if he should wear a mask.    OV 04/21/2019  Subjective:  Patient ID: Kenneth [name], male , DOB: Nov 30, 1940 , age 55 y.o. , MRN: [id number] , ADDRESS: [place] [address]   04/21/2019 -   Chief Complaint  Patient presents with   Follow-up    Patient reports that his breathing is doing well at this time.    IPF followup. On esbriet  HPI Kenneth [name] 81 y.o. -presents for follow-up of his idiopathic pulmonary fibrosis.  Last visit was pre-pandemic in February 2018.  In the interim he did see Dr. Shana Chute at [facility] and deemed to be stable.  At this point in time he is reporting continued stability with the symptoms as seen by the score below.  However I did notice that he is lost lot of weight.  His BMI with his clothes on is 25 with a weight of 170 pounds.  He says his dry weight is actually around 162 pounds.  He feels that this is the stable weight in the last 6 months.  He says he is not bothered by it.  He says this is because of low appetite because of the and pirfenidone.  He feels the pirfenidone is benefiting him and he continues to be is to be stable.  In fact his pulmonary function test shows " improvement".  He said that he felt the test today was somewhat variable and is effort based on the technician.  He had a recent high-resolution CT chest that shows continued stability.  I personally visualized this film.  At this point in time he is happy taking the pirfenidone.  But he did agree if there is further weight loss into the 150s pounds then we could reassess  Had a lot of discussion about COVID-19 and about appropriate risk reduction measures which  he is continuing to do.  He did some read some  literature about COVID-19 and had questions about it. He in Vit D supplementation and is agreeable to get his levels checked following literature about protective effect of Vit D  Other than that he reports 2 episodes of dizziness and in one of those he nearly passed out.  He was wondering if some of the symptoms were related to ropinirole and therefore he has stopped it.  I reviewed the literature with him and the significant amount of dizziness and hypotension.  And also GI symptoms which can probably accentuated with pirfenidone.   OV 10/16/2019  Subjective:  Patient ID: Kenneth [name], male , DOB: 10-10-1940 , age 63 y.o. , MRN: [id number] , ADDRESS: [place] [address]   10/16/2019 -   Chief Complaint  Patient presents with   Follow-up    PFT performed 3/31.  Pt states he is doing okay. States his breathing is about the same since last visit.     HPI SIMMIE CAMERER 81 y.o.  -follow-up IPF on pirfenidone    -ROS   17-monthfollow-up for Dr. YAnnamaria Boots  He continues on pirfenidone.  He is taking full dose.  In the interim he has had hernia surgery 6 weeks ago for right inguinal hernia.  This is gone well.  He is change his exercise from bicycle to walking because of the hernia.  His symptom score is the same.  [facility] 2021 [facility].  Deemed to be stable.  I reviewed that note.  After that he has had pulmonary function test with [place] weeks ago.  This shows a decline.  He says the quality of instructions was poor and therefore the decline is because of that.  At least that is his suspicion.  He feels well.  Walking desaturation test is stable.  He has gained some weight.  He continues to have some nausea and fatigue and diarrhea with the pirfenidone.  But this is stable and manageable.  He feels his irritable bowel syndrome is active because of the pirfenidone.  He plans to see Dr. JJoanell Risingfor that.  His last liver  function test was with Dr. GLauris Chromanin January 2021.  This was normal per the note.  His last CT scan was towards the end of 2020 and deemed to be stable.     OV 04/11/2020   Subjective:  Patient ID: Kenneth [name] male , DOB: 401-10-1940 age 81y.o. years. , MRN: [id number]  [address] [address] [id number]  PCrist Infante MD Providers : Dr GAlisia Ferrari Treatment Team:  Attending Provider: RBrand Males MD   Chief Complaint  Patient presents with   Follow-up    PFT performed today.  Pt states he has been doing well since last visit and denies any complaints.    Follow-up idiopathic pulmonary fibrosis.  Diagnosis towards the end of 2018.  On pirfenidone.  Last CT scan of the chest September 2020.   HPI Kenneth [id number].o. -presents for follow-up last seen in April 2021.  After that in July 2021 he [facility]  I reviewed the notes.  The feeling was that he was stable.  He is only minimally symptomatic.  He had a 6-minute walk test and did really well without any significant desaturations.  He is now resorted  to high intensity interval training based on advice from a physical therapist/gym instructor.  He feels like stronger and failure.  He also states that his appetite is better and is eating better.  He feels his weight is stable.  In terms of his IPF symptoms is minimal and documented below.  He just has mild irritable bowel syndrome with his Esbriet and baseline health.  His walking desaturation test today in the office is stable.  His pulmonary function test was reviewed.  Over the last 3 years it shows a slight steady decline.  This decline is both in FVC and DLCO.  He is averaging around 2.6% decline per year with FVC while 8% decline in the DLCO per year.  On an average with a 3% decline per year in the last 3 years.  We did discuss this.  We discussed #pulmonary fibrosis foundation support group -he will visit the  website.  He gets newsletters from them.  At this point in time he is not interested in joining the support group but he did get the contact information for this and will reach out if he desires so.  #-We also discussed research as a care option -we discussed trial sponsored by [place] [facility] of pirfenidone.  The trial is called [employer].  The investigation medical product is called [id number]-[id number] in the axilla macrophage pathway.  Promising phase 2 data.  Research team gave him the consent form under my delegation.  He understands research is voluntary and is to evaluate unproven therapies.  He understands that the primary intent is to contribute to her scientific development and therapeutic gain is secondary because of an approved product the risks benefit ratio is very different compared to blood products.  He is going to read the consent and think about potential participation.  I sent him via email medical journal articles about the investigational product    He tells me that he feels his weight is stable.  His dry weight early in the morning is between 162-163 pounds.  I pointed out that his weight today is 168 pounds and is lower than before.  However he says that the difference rating 168-172 is related to clothing and his weight at home is stable.  He is not concerned about the slight weight loss.  Overall while he agrees he is lost weight since starting Esbriet he feels recently has been stable.  He wants to continue to monitor the situation  Irritable bowel: He said he will check with Dr. Henrene Pastor about this  Other issues:  - He wants me to check his iron panel -He will do follow-up of his IPF-registry visit today  -for research-  -He has had his Covid booster    OV 10/22/2020  Subjective:  Patient ID: Kenneth [name], male , DOB: 01-20-1941 , age 28 y.o. , MRN: [id number] , ADDRESS: [place] [address] PCP [name], MD Patient Care Team: [name], MD as  PCP - General (Internal Medicine) Cira Rue, RN as Oncology Nurse Navigator  This Provider for this visit: Treatment Team:  Attending Provider: Brand Males, MD    10/22/2020 -   Chief Complaint  Patient presents with   Follow-up    Fatigue from radiation treatments, more winded and more GI upset from radiation treatments    Follow-up idiopathic pulmonary fibrosis.  Diagnosis towards the end of 2018.  On pirfenidone.  Last CT scan of the chest September 2020.    HPI Kenneth Ishihara  A Eidem 81 y.o. -returns for follow-up.  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  He is finished hormonal treatment and now is on radiation treatment.  Radiation treatment is aggravating his GI side effects from Pirfenidone (Esbriet) and from irritable bowel syndrome.  Is also causing fatigue.  He went August a masters in walk the tournament.  This made him more fatigued than what he experienced few years ago.  He said it took him a few days to recover.  He used to have weight loss but this is now stabilized it is documented below.  His symptom severity score and his walking desaturation test are stable but his pulmonary function test continues to show decline.  His DLCO shows a relatively more decline but he said there were technique issues today.  He is got upcoming appointment with ILD clinic at [facility] in July.  At this point in time he is interested in phase 3 clinical trials.  We have quite a few to off as of summer 2022 but his prostate cancer could be an exclusion.  We will have the research team evaluate.  He is saddened by the loss of his friend Dr. March Rummage.  He is evaluating whether he should join the pulmonary fibrosis foundation at the national level to be a board member.     OV 04/03/2021  Subjective:  Patient ID: Kenneth [name], male , DOB: Dec 14, 1940 , age 86 y.o. , MRN: [id number] , ADDRESS: [place] [address] PCP [name], MD Patient Care Team: [name], MD as PCP - General (Internal Medicine) Cira Rue, RN as Oncology Nurse Navigator  This Provider for this visit: Treatment Team:  Attending Provider: Brand Males, MD    04/03/2021 -   Chief Complaint  Patient presents with   Follow-up    Just performed a partial PFT, patient states increased SOB.    Follow-up idiopathic pulmonary fibrosis.  Diagnosis towards the end of 2018.  On pirfenidone.  Last CT scan of the chest September 2020. - > sept 2022  Underlying irritable bowel syndrome  Initial weight loss with pirfenidone and then stabilized  HPI Kenneth [name] 81 y.o. -Dr. Annamaria Boots presents for his IPF follow-up.  Since I last saw him he has had several health issues.  He got diagnosed with prostate cancer and then had radiation to that.  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  He is going to have a colonoscopy.  He feels his irritable bowel syndrome pirfenidone and ultimately the radiation made his GI symptoms significantly worse.  In order to combat the fatigue he has been undergoing physical therapy.  He also try to get back to golf and took a golf swing for practice and then states that he had a compression fracture in the spine.  This is probably also because of years of steroid injections in his back.  In addition when he had a high-resolution CT chest in September 2022 this month there was some neck nodes and he has had a neck CT scan followed by ENT visit to [name].  Apparently fine-needle aspiration was nondiagnostic.  He is scheduled to undergo excision biopsy.  The combination of all this is gotten a little bit more anxious and depressed.  As you can see his symptom scores for depression and anxiety are up.  He is also feeling a little more short of breath than usual.  He  says his pulse ox is in the low 90s when he exerts himself but it still adequate.  Review of his symptoms and objective basis shows his symptom score  to be the same.  His walking desaturation test is also the same.  His weight is stable.  His pulmonary function test shows stability in diffusion but a decline in FVC compared to earlier this year.  Had a high-resolution CT chest that only shows minimal progression fibrosis over 2 years.  Echocardiogram September 2022 did not show any evidence of pulmonary hypertension     CLINICAL DATA:  Pulmonary fibrosis.   EXAM: CT CHEST WITHOUT CONTRAST   TECHNIQUE: Multidetector CT imaging of the chest was performed following the standard protocol without intravenous contrast. High resolution imaging of the lungs, as well as inspiratory and expiratory imaging, was performed.   COMPARISON:  03/28/2019, 03/30/2018 and 02/27/2017. Bone scan 05/21/2020.   FINDINGS: Cardiovascular: Atherosclerotic calcification of the aorta, aortic valve and coronary arteries. Enlarged pulmonic trunk and right and left main pulmonary arteries. Heart is at the upper limits of normal in size. No pericardial effusion.   Mediastinum/Nodes: Low left internal jugular lymph nodes measure up to 8 mm (2/8), previously 3 mm on 03/28/2019. Mediastinal lymph nodes are subcentimeter in short axis size, as before. Hilar regions are difficult to definitively evaluate without IV contrast. Left axillary lymph nodes measure up to 9 mm, are unchanged and not considered enlarged by CT size criteria. High left subpectoral lymph node measures 7 mm (2/11), increased from 4 mm on 03/28/2019. No right axillary adenopathy. Esophagus is grossly unremarkable. Periesophageal lymph nodes are subcentimeter in short axis size and appears similar.   Lungs/Pleura: Peripheral and basilar predominant subpleural reticulation, ground-glass and traction bronchiectasis/bronchiolectasis, minimally progressive from 03/28/2019. Evidence of progression is best seen in the lower lung zones. No pleural fluid. Airway is unremarkable. No air trapping.    Upper Abdomen: Visualized portions of the liver, gallbladder, adrenal glands, kidneys, spleen, pancreas, stomach and bowel are unremarkable.   Musculoskeletal: Degenerative changes in the spine. There is a new small rounded sclerotic lesion in the lateral aspect of the right third rib (sagittal image 32). New lucent lesion in the left posterolateral aspect of the T8 vertebral body measuring approximately 1.3 x 1.7 x 2.1 cm (2/84 and sagittal image 96). This likely corresponds to abnormal uptake seen on bone scan 05/21/2020. Finding is new from 03/28/2019. Slight compression of the T12 superior endplate is new.   IMPRESSION: 1. Slight interval progression of pulmonary fibrosis. Findings are consistent with UIP per consensus guidelines: Diagnosis of Idiopathic Pulmonary Fibrosis: An Official ATS/ERS/JRS/ALAT Clinical Practice Guideline. [place], Iss 5, [phone], Mar 06 2017. 2. New small sclerotic lesion in the lateral aspect of the right third rib, worrisome for metastatic prostate cancer. 3. Lucent lesion in the left posterolateral aspect of the T8 vertebral body corresponds to abnormal uptake on bone scan 05/21/2020, and is new from CT chest 03/28/2019, raising suspicion for malignancy. 4. Left internal and high left subpectoral lymph nodes are subcentimeter in size but appear slightly larger than on 03/28/2019. Difficult to exclude a lymphoproliferative disorder. Please refer to CT neck with contrast done the same day in further evaluation. 5. Slight compression of the T12 superior endplate is new but age indeterminate. 6. Aortic atherosclerosis (ICD10-I70.0). Coronary artery calcification. 7. Enlarged pulmonary arteries, indicative of pulmonary arterial hypertension.     Electronically Signed   By: [name]  On: 03/19/2021 15:12   Xxxxx  IMPRESSION: Neck CT September 2022 Mildly prominent lymph nodes in the neck bilaterally left  greater than right. Cluster of left supraclavicular and left posterior lymph nodes all under 1 cm.   Largest lymph node in the neck is a right level 3 lymph node measuring 12 mm on axial image 75.     Electronically Signed   By: Franchot Gallo M.D.   On: 03/19/2021 15:24   Xxxx IMPRESSIONS echocardiogram September 2022    1. Left ventricular ejection fraction, by estimation, is 65 to 70%. The  left ventricle has normal function. The left ventricle has no regional  wall motion abnormalities. Left ventricular diastolic parameters are  consistent with Grade I diastolic  dysfunction (impaired relaxation).   2. Right ventricular systolic function is normal. The right ventricular  size is normal. Tricuspid regurgitation signal is inadequate for assessing  PA pressure.   3. Left atrial size was mildly dilated.   4. The mitral valve is abnormal. Trivial mitral valve regurgitation. No  evidence of mitral stenosis.   5. The aortic valve is tricuspid. There is mild calcification of the  aortic valve. There is moderate thickening of the aortic valve. Aortic  valve regurgitation is not visualized. Mild aortic valve stenosis. Aortic  valve area, by VTI measures 1.62 cm.  Aortic valve mean gradient measures 12.0 mmHg. Aortic valve Vmax measures  2.31 m/s. DI is 0.43.   6. Aortic dilatation noted. There is borderline dilatation of the aortic  root, measuring 39 mm. There is borderline dilatation of the ascending  aorta, measuring 39 mm.   7. The inferior vena cava is normal in size with <50% respiratory  variability, suggesting right atrial pressure of 8 mmHg.   Comparison(s): Changes from prior study are noted. 03/02/2017: LVEF 60-65%,  mild LVH, grade 1 DD, aortic root 39 mm, no aortic stenosis.   OV 06/17/2021  Subjective:  Patient ID: Kenneth [name], male , DOB: 19-Nov-1940 , age 30 y.o. , MRN: [id number] , ADDRESS: [place] [id number] PCP [name],  MD Patient Care Team: [name], MD as PCP - General (Internal Medicine) Cira Rue, RN as Oncology Nurse Navigator O'[place]el[place], Mindi Slicker, RN as Oncology Nurse Navigator  This Provider for this visit: Treatment Team:  Attending Provider: Brand Males, MD    06/17/2021 -   Chief Complaint  Patient presents with   Follow-up    Pt states he was recently diagnosed with B-Cell Lymphoma.     HPI MARRELL DICAPRIO 81 y.o. 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  Since his last visit from ILD standpoint he continues to be stable.  His walking desaturation test is stable.  However he is reporting more shortness of breath but this is directly related to more fatigue.  Review of the labs indicate in October 2022 he had mild anemia and also mild hyponatremia.  In the past radiation therapy for prostate made him extremely tired.  He is also on pirfenidone that can cause tiredness.  He now has lymphoma also that can cause tiredness.  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  He seen Dr. Kavin Leech.  They are planning to start R-CHOP treatment after Christmas.  He is frustrated by the recurrence of various medical illnesses.  The second cancer since his IPF diagnosis.  In terms of his IPF: He is now on generic pirfenidone.  He feels the nausea is worse.  He wants to switch to branded Esbriet.  Gave him donor sample of Esbriet by [employer].  Lot number [id number] with expiration June 2025 [unopened bottle], lot number [id number] with expiration 05/2023 [unopened bottle and also lot number [id number] with expiration 12/2023 [partial bottle].  He is asked me to send a message to the pharmacist to make the switch.  He says even if it is more expensive he will play for the branded Esbriet because it is better quality of life.  He is worried that nausea and other side effects will get worse  particularly once he starts chemotherapy.   No results found.   OV 09/18/2021  Subjective:  Patient ID: Kenneth [name], male , DOB: 09/10/1940 , age 52 y.o. , MRN: [id number] , ADDRESS: [place] [address] PCP [name], MD Patient Care Team: [name], MD as PCP - General (Internal Medicine) Cira Rue, RN as Oncology Nurse Navigator O'[place]elley, Mindi Slicker, RN as Oncology Nurse Navigator  This Provider for this visit: Treatment Team:  Attending Provider: Brand Males, MD  Follow-up idiopathic pulmonary fibrosis.  Diagnosis towards the end of 2018.  On pirfenidone.  Last CT scan of the chest September 2020. - > sept 2022  Underlying irritable bowel syndrome  Initial weight loss with pirfenidone and then stabilized  History of prostate cancer  New diagnosis of B-cell lymphoma cervical lymphadenopathy -late 2022  Multifactorial fatigue  - mild anemia OCt 2022  - mild hypontermia OCt 2022  09/18/2021 -   Chief Complaint  Patient presents with   Follow-up    PFT performed today.  Pt states he is about the same since last visit. States that he is currently undergoing chemotherapy due to diagnosis of lymphoma.     HPI Kenneth [name] 81 y.o. -returns for follow-up.  He is taking Esbriet instead of generic pirfenidone.  He is also taking 3 little pills 3 times daily.  He finds that Esbriet to be better than generic pirfenidone.  He finds 3 little pills better than 1 whole pill in terms of nausea.  Still is dealing with a lot of nausea both from the chemo and Esbriet.  He says yesterday was really bad day with nausea.  He does take Zofran but we discussed and said he will try taking the Zofran 30 to 60 minutes before the Esbriet.  Nevertheless he has been able to gain some weight.  He is actually feeling slightly less short of breath.  He is able to do more particularly after his back pain has resolved.  Nevertheless he is on antiprostate cancer therapy.  He  is also undergoing chemotherapy from his B-cell lymphoma.  His mood is better.  He says he is socially isolating significantly because of his pulm fibrosis and cancer history.  He wanted to know if he could go out.  He did go to [employer] for show but wore a mask.  I did indicate to him that he could follow the CDC map to see prevalence of respiratory viruses and when the problem is overall low he could take more risk.  Also like being outdoors is less risky.  If he is endorsing particularly a cluster putting a mask on reduces his risk.  But I did say that he could use these guardrails and be more active so he can get some socialization and help his mood.  Can also give him purpose.  He is now sold his house and is planning to move into [facility].  In terms of his walking desaturation test and symptoms he stable.  In terms of his pulmonary function test his DLCO is stable but his FVC is down.  He is feeling similar.  Most of the decline seems to be from several years ago but most recently appears to be stable overall. We did discuss clinical trials but cancer generally is an exclusion but otherwise he would be interested.  We did discuss potential for doing inhaled treprostinil trial.  We did discuss the fact the neck step and standard of care would be oxygen if he were to decline but so far he has held good.  He recently lost his friend [facility] who also had pulmonary fibrosis.     He has upcoming [employer] appointment but he missed the 1 earlier in the year because of all ischemia.  He is thinking about holding off on going to [facility] and just following here.  I was fine with this.  CT Chest data  CT Chest data  No results found.     OV 10/28/2021  Subjective:  Patient ID: Kenneth [name], male , DOB: 1940-10-12 , age 2 y.o. , MRN: [id number] , ADDRESS: [place] [address] PCP [name], MD Patient Care Team: [name], MD as PCP - General  (Internal Medicine) Cira Rue, RN as Oncology Nurse Navigator O'Kelley, Mindi Slicker, RN as Oncology Nurse Navigator  This Provider for this visit: Treatment Team:  Attending Provider: Brand Males, MD    10/28/2021 -   Chief Complaint  Patient presents with   Acute Visit    Pt states since his last chemo treatment, he has been having problems with his breathing and also states that his pulse has been elevated.   Follow-up idiopathic pulmonary fibrosis.  Diagnosis towards the end of 2018.  On pirfenidone.  Last CT scan of the chest September 2020. 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 -presents with his wife.  Status meeting his wife for the first time but she reminded me that she had made him early on in the course of his illness.  He is finished cycles of R-CHOP treatment for his lymphoma.  On 10/23/2021 Thursday he saw a nurse practitioner in oncology.  At that point it was noticed that he had finished mini CHOP treatment 5 rounds 10/02/2021.  He did complete his 6 cycle.  He was feeling well.  His resting heart rate was 82.  Pulse ox at rest was 100%.  His white count was observed to be slightly low.  They discussed several options and decided to proceed with chemotherapy followed by [facility] single dose on 10/24/2021 Friday.  He was doing fairly okay then on Sunday, 10/26/2021 he noticed resting tachycardia heart rate 125 but no fever.  At no point any fever.  No cough no chills no nausea no vomiting no diarrhea.  The same thing persisted even on Monday, 10/27/2021.  He noticed that if he walked his heart rate was up to 140 he became short of breath and stop.  His pulse ox  would drop to 90% but no abnormal  desaturations.  He started getting worried.  He went to [employer] yesterday 10/27/2021.  EKG reviewed sinus tachycardia with some ectopic atrial beats.  Nothing acute.    Labs: Found to have mild hyponatremia.  Also found to have mild anemia white count in response to Udenyca went from low to  high.No D-dimer.  No troponin no BNP checked.  Last echo and CT scan of the chest in September 2022.  Walking desaturation test is definitely more tachycardic than baseline.  Associated with this he is also got exaggerated pulse ox drop although still adequate for simple walking test.  His pace is slower.   We repeated an EKG here: Personally visualized.  Sinus tachycardia heart rate 120 approximately.  Nothing acute.         OV 01/09/2022  Subjective:  Patient ID: Kenneth [name], male , DOB: 12/29/1940 , age 50 y.o. , MRN: [id number] , ADDRESS: [address] [facility] [address] PCP [name], MD Patient Care Team: [name], MD as PCP - General (Internal Medicine) Cira Rue, RN as Oncology Nurse Navigator O'Kelley, Mindi Slicker, RN as Oncology Nurse Navigator  This Provider for this visit: Treatment Team:  Attending Provider: Brand Males, MD    01/09/2022 -   Chief Complaint  Patient presents with   Follow-up    PFT performed today. Pt states that he feels like he may be a little worse since last visit. States that he has had some problems with congestion which he wonders could be due to the weather.   HPI Kenneth [name] 81 y.o. 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  At this point in time he is finished his chemotherapy.  In April 2023 he did have some tachycardic response following the last chemo.  Cardiac work-up ended up being fine.  He says overall he is been feeling fine.  He is now relocated to a retirement home in [place].  [place]  During this time his pulse ox immediately after exertion was always in the  90s.  He did not feel that his IPF was getting worse when he was there.  He just came back yesterday.  However for the last few days he is having increased fatigue nasal congestion.  He also feels his left ear is blocked.  He tried to do pulmonary function test today.  He felt he did not give a good effort particularly the DLCO.  Consistent with this his numbers are much worse.  His FVC shows a 7% decline compared to March 2023 and DLCO 14% decline compared to March 2023.  His symptom score seems similar to September 2022.  His walking desaturation test is also stable on this level ground.  His weight loss is stabilized since he is at 168 pounds.  He is frustrated by this.  Is also frustrated by his multiple health issues.  The pirfenidone he is taking the generic version.  He is tolerating it but is requiring Zofran which then causes constipation.  Emotionally he is looking for a break from all his health issues  He plans [place] the [place] today.     01/26/2022: Today - follow up Patient presents today for follow up after being treated for sinusitis. He reports feeling better but still has issues with nasal congestion and pressure in his left ear. They did improve  with prednisone taper. These are problems he has been dealing with for a while now. He has tried multiple different nasal sprays with some relief. 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 He is sure this was the result of his worsening pulmonary function testing. He is working on getting in to see [name] with ENT. Otherwise, no concerns today. Breathing is overall stable. He gets winded with stair climbing but otherwise, no issues. Doesn't feel like his activity is limited by his breathing. He's planning to go back [place] the mountains in the next few weeks.     OV 04/07/2022  Subjective:  Patient ID: Kenneth [name], male , DOB: 02-13-1941 , age 85 y.o. , MRN: [id number] , ADDRESS: [address] [facility]  [id number] PCP [name], MD Patient Care Team: [name], MD as PCP - General (Internal Medicine) Cira Rue, RN as Oncology Nurse Navigator O'[place]el[place], Mindi Slicker, RN as Oncology Nurse Navigator  This Provider for this visit: Treatment Team:  Attending Provider: Brand Males, MD   Follow-up idiopathic pulmonary fibrosis.  Diagnosis towards the end of 2018.  On pirfenidone.  Last CT scan of the chest September 2020. 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 Breathing has been gradually worse since the last visit. He has had some muscle fatigue with exertion. He has some occ cough- non prod.      HPI Kenneth [name] 81 y.o. -returns for follow-up.  He feels he is slowly progressing in terms of his IPF.  In fact his shortness of breath scores are slightly worse.  Last time his pulmonary function test showed a decline he thought was from sinusitis.  Sinusitis is cleared up but he says today when he did his PFTs he felt that he was declining.  He did have some cough he is having some new cough for the last few weeks.  He is worried this might be from worsening IPF but he typically gets cough this time of the year from fall allergy season.  Pulmonary function test shows an improvement compared to the most recent one but overall compared to earlier this year there is a decline.  I shared these results with him.  He is more worried about his fatigue.  However he did play some golf.  He says that he is significantly fatigued.  He has been going on for 6 to 12 months initially thought was related to cancer and chemotherapy but he says it is the  all the time.  The fatigue persist despite anemia correction despite his chemotherapy completing is still got fatigue.  He feels it happens episodically but then it is there all over the body and it exhausting.  He believes his primary care is checked his vitamin D levels because he is on replacement.  He believes his TSH and hemoglobin A1c to be normal.  Apparently primary care physician has asked him to consider getting a CT coronaries.  He has seen Dr. Loralie Champagne in the past.  At the time it was from acute chemotherapy consideration.  Dr. Aundra Dubin at the time got an echocardiogram.  He  does have enlarged pulmonary artery on the CT scan and he does have coronary artery calcification.  Back then in the spring during chemo side effects patient had deferred right heart catheterization.  This time of reach out to Dr. 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 0 0 0 0 0 0 1 2 0 1  How bad is your fatigue 1 due to esbriet 0 2 0 2 1 3._3 nausea   1 0 _4 0 1 1  vomit   0 0 0 0 0 0 0 0  diarrhea   _5 0 1 1  anxiety   0 0 0 _6 0  depresso0   0 0 0 _7 0        Simple office walk 185 feet x  3 laps goal with forehead probe 12/07/2017 Weight 180# 03/08/2018 Weight 174.9  05/10/2018 Weight 174# 08/23/2018 Weight 178# 04/21/2019 Weight 170# 10/16/2019 173# 04/11/2020 168# 10/22/2020 170# 04/03/2021 168# 06/17/2021 168# 09/18/2021  10/28/2021  01/09/2022 168#   O2 used _8  Room air   ra ra ra  ra   Number laps completed _9 Comments about pace Moderate pace Mod pace Mod fast pace fast  fast   fast   Avg pace - slower than before    Resting Pulse Ox/HR 100% and 83/min 100% ad 85/min 100% and 91/min 100% and 82/min  100% asnd 91 100% and 83/min 98% and 94/min 100% and 82 100% and 93 100% and 78 99% and H 126 99% and HR 88   Final Pulse Ox/HR 99% and 98/min 98$ and 104/min 98% and 108/min 98% and 100/min  96% and 106/mom 98% and 96/mn 95% and 110.min 97% and 105 97% ad 109 97% and 94 92% and HR 143 97% and HR 111   Desaturated </= 88% no no no no  no          Desaturated <= 3% points no no ni no  yesm 4 points  Yes, 3 points Yes, 3 poitn Yes 3 pomnts Yes 3 Yes, 7 points    Got Tachycardic >/= 90/min yes yes yes yes  yes  _10   Symptoms at end of test No complaints No complaints  No complaints  Mild dyspnea  no Mild dyspnea Mild dyspnea Mild dysnea Modeate dyspnea    Miscellaneous comments x ? sligh increase in tachy/stable ? incraeased tachty       Stable since April 2021           PFT     Latest Ref Rng & Units 04/07/2022    1:46 PM 01/09/2022    9:17 AM 09/18/2021    8:52 AM 04/03/2021    9:21 AM 10/21/2020    8:56 AM 04/11/2020   10:04 AM 10/04/2019    9:32 AM  PFT Results  FVC-Pre L 2.53  P 2.49  2.68  2.96  3.20  3.26  3.35   FVC-Predicted Pre % 68  P 64  68  75  81  82  83   Pre FEV1/FVC % % 86  P 87  84  81  81  80  82   FEV1-Pre L 2.18  P 2.16  2.25  2.38  2.59  2.62  2.74   FEV1-Predicted Pre % 83  P 78  80  85  91  92  95   DLCO uncorrected ml/min/mmHg 12.36  P 12.55  13.07  16.15  13.64  17.26  16.69   DLCO UNC% % 53  P 53  54  67  56  72  69   DLCO corrected ml/min/mmHg  13.57  P 12.55  14.64  16.15  13.64  17.26  17.31   DLCO COR %Predicted % 59  P 53  61  67  56  72  71   DLVA Predicted % 89  P 91  105  91  80  94  91     P Preliminary result       has a past medical history of Arthritis, Diverticulosis of colon (without mention of hemorrhage), GERD (gastroesophageal reflux disease), Hiatal hernia, HLD (hyperlipidemia), Internal hemorrhoids without mention of complication, Irritable bowel syndrome, Other specified gastritis without mention of hemorrhage, Prostate cancer ([place]), Pulmonary fibrosis ([place]), and Stress fracture (02/2021).   reports that he has never smoked. He has never used smokeless tobacco.  Past Surgical History:  Procedure Laterality Date   CARPAL TUNNEL RELEASE     left   CATARACT EXTRACTION  06/2011   bilateral   DEEP NECK LYMPH NODE BIOPSY / EXCISION  03/25/2021   INGUINAL HERNIA REPAIR Right 09/05/2019   Procedure: OPEN REPAIR RIGHT INGUINAL HERNIA WITH MESH;  Surgeon: Armandina Gemma, MD;  Location: [facility];  Service: General;  Laterality: Right;   [place]   x 2   LUMBAR LAMINECTOMY/DECOMPRESSION MICRODISCECTOMY Left 07/11/2013   Procedure: LUMBAR ONE TO TWO, LUMBAR TWO TO THREE, LUMBAR THREE TO FOUR LUMBAR LAMINECTOMY/DECOMPRESSION MICRODISCECTOMY 3 LEVELS;  Surgeon: Charlie Pitter, MD;  Location: [facility];  Service: Neurosurgery;  Laterality: Left;   NASAL SINUS SURGERY      No Known Allergies  Immunization History  Administered Date(s) Administered   Fluad Quad(high Dose 65+) 03/18/2020   Influenza Split 10/03/2009, 03/28/2010, 04/14/2011, 03/22/2012, 03/09/2013, 03/28/2014   Influenza, High Dose Seasonal PF 03/19/2016, 03/06/2017, 04/09/2018, 03/07/2019, 04/06/2020   Influenza, Quadrivalent, Recombinant, Inj, Pf 03/12/2018, 03/10/2019, 04/06/2020   Influenza,inj,Quad PF,6+ Mos 04/04/2014, 03/26/2015, 04/05/2016   Influenza-Unspecified 04/05/2016, 04/05/2021   PFIZER Comirnaty(Gray Top)Covid-19  Tri-Sucrose Vaccine 10/15/2020   PFIZER(Purple Top)SARS-COV-2 Vaccination 07/20/2019, 08/10/2019, 02/27/2020, 03/23/2021   Pneumococcal Conjugate-13 03/30/2013, 07/31/2013,  04/26/2018   Pneumococcal Polysaccharide-23 10/03/2009, 01/14/2010, 03/31/2018, 02/18/2021   Pneumococcal-Unspecified 04/05/2016   Td 10/20/2016   Tdap 10/03/2009   Zoster Recombinat (Shingrix) 01/03/2017   Zoster, Live 10/03/2009    Family History  Problem Relation Age of Onset   Heart failure Father 16   Bladder Cancer Mother    Breast cancer Mother    Colon cancer Neg Hx    Pancreatic cancer Neg Hx    Rectal cancer Neg Hx    Stomach cancer Neg Hx    Prostate cancer Neg Hx      Current Outpatient Medications:    acetaminophen-codeine (TYLENOL #3) 300-30 MG per tablet, Take 1 tablet by mouth every 8 (eight) hours as needed for moderate pain., Disp: , Rfl:    aspirin EC 81 MG tablet, Take 81 mg by mouth 3 (three) times a week., Disp: , Rfl:    Bacillus Coagulans-Inulin (ALIGN PREBIOTIC-PROBIOTIC PO), Take by mouth., Disp: , Rfl:    carisoprodol (SOMA) 350 MG tablet, Take 175 mg by mouth 3 (three) times daily as needed for muscle spasms., Disp: , Rfl:    Cholecalciferol (VITAMIN D-3) 1000 UNITS CAPS, Take 2,000 Units by mouth every evening., Disp: , Rfl:    CO-ENZYME Q-10 PO, Take 1 tablet by mouth every evening. , Disp: , Rfl:    Cyanocobalamin (VITAMIN B12) 1000 MCG TBCR, Take 1,000 mcg by mouth daily., Disp: , Rfl:    denosumab (PROLIA) 60 MG/ML SOSY injection, Inject 60 mg into the skin every 6 (six) months. At [place], Disp: , Rfl:    diclofenac (VOLTAREN) 75 MG EC tablet, Take 75 mg by mouth 2 (two) times daily as needed (pain.)., Disp: , Rfl: 2   diclofenac sodium (VOLTAREN) 1 % GEL, Apply 4 g topically 4 (four) times daily as needed (pain.)., Disp: , Rfl: 12   dicyclomine (BENTYL) 10 MG capsule, Take one by mouth every 6 hours as needed for abdominal cramping (Patient taking differently: Take  10 mg by mouth every 6 (six) hours as needed (abdominal spasms.).), Disp: 30 capsule, Rfl: 1   diphenoxylate-atropine (LOMOTIL) 2.5-0.025 MG tablet, Take 1 tablet by mouth 4 (four) times daily as needed for diarrhea or loose stools., Disp: , Rfl:    Fluticasone Propionate (XHANCE) 93 MCG/ACT EXHU, 1 spray in each nostril, Disp: , Rfl:    Glucosamine Sulfate 500 MG TABS, Take 500 mg by mouth daily., Disp: , Rfl:    HYDROcodone-acetaminophen (NORCO/VICODIN) 5-325 MG tablet, Take 1-2 tablets by mouth every 4 (four) hours as needed for moderate pain., Disp: 24 tablet, Rfl: 0   leuprolide, 6 Month, (ELIGARD) 45 MG injection, Inject 45 mg into the skin every 6 (six) months. Per Dr. Tresa Moore office, Disp: , Rfl:    loratadine (CLARITIN) 10 MG tablet, Take 10 mg by mouth daily., Disp: , Rfl:    meclizine (ANTIVERT) 25 MG tablet, Take 25 mg by mouth 3 (three) times daily as needed for dizziness. , Disp: , Rfl:    methocarbamol (ROBAXIN) 500 MG tablet, take 1 tab 4 times daily prn spasm, Disp: , Rfl:    Multiple Vitamin (MULTIVITAMIN) tablet, Take 1 tablet by mouth daily., Disp: , Rfl:    omeprazole (PRILOSEC) 40 MG capsule, Take 1 capsule (40 mg total) by mouth daily., Disp: 30 capsule, Rfl: 11   ondansetron (ZOFRAN) 8 MG tablet, TAKE 1 TABLET (8 MG TOTAL) BY MOUTH EVERY 8 (EIGHT) HOURS AS NEEDED FOR NAUSEA OR VOMITING. START 72 HOURS AFTER  IV CHEMOTHERAPY ADMINISTRATION, Disp: 30 tablet, Rfl: 1   oxybutynin (DITROPAN) 5 MG tablet, Take 5 mg by mouth every 8 (eight) hours as needed., Disp: , Rfl:    Pirfenidone (ESBRIET) 801 MG TABS, Take 1 tablet by mouth 3 (three) times daily with meals., Disp: 270 tablet, Rfl: 1   prochlorperazine (COMPAZINE) 10 MG tablet, Take 1 tablet (10 mg total) by mouth every 6 (six) hours as needed for nausea., Disp: 60 tablet, Rfl: 1      Objective:   Vitals:   04/07/22 1426  BP: 112/60  Pulse: 90  Temp: 98.2 F (36.8 C)  TempSrc: Oral  SpO2: 97%  Weight: 173 lb (78.5 kg)   Height: 5' 8" (1.727 m)    Estimated body mass index is 26.3 kg/m as calculated from the following:   Height as of this encounter: 5' 8" (1.727 m).   Weight as of this encounter: 173 lb (78.5 kg).  _0 @  Filed Weights   04/07/22 1426  Weight: 173 lb (78.5 kg)     Physical Exam    General: No distress. Looks well Neuro: Alert and Oriented x 3. GCS 15. Speech normal Psych: Pleasant Resp:  Barrel Chest - no.  Wheeze - no, Crackles - yes base, No overt respiratory distress CVS: Normal heart sounds. Murmurs - no Ext: Stigmata of Connective Tissue Disease - no HEENT: Normal upper airway. PEERL +. No post nasal drip        Assessment:       ICD-10-CM   1. 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HCC)  J84.112 CK (Creatine Kinase)    Aldolase    QuantiFERON-TB Gold Plus    Sed Rate (ESR)    Sed Rate (ESR)    QuantiFERON-TB Gold Plus    Aldolase    CK (Creatine Kinase)    2. 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 Other fatigue  R53.83     4. Encounter for therapeutic drug monitoring  Z51.81          Plan:     Patient Instructions  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[place]) Encounter for therapeutic drug monitoring Nausea due to drug  -  IPF slowly getting worse  Plan  - continue pirfenidone but give holiday due to fatiuige (See below)   - zofran as needed for nausea - monitor pulse ox even with exertion - goal >= 88% - IPF PRO registry visit 04/07/2022   Fatigue - significant  6-12 months Enlarged Pulmnary Arteries on CT Coronary artery calcification  Plan - check ck, aldolase, sed rate, quantifergon gold 04/07/2022 - will ask Dr Loralie Champagne to consider left heart evaluation as advised by PCP [name], MD  - will ask Dr Aundra Dubin to consider right heart cath - give pirfenidone holiday x 10 days  - then start at 2 pills three times daily x 1 week -> then 3 pills three times daily with food   Weight loss  STabilized since sept 2022  . Currently > 170#  Plan  - monitor  Followup - 2 months - 30 min visit with Dr [name]  - symptom score and walk test at followup  High complex medical condition requiring high risk prescription with intensive therapeutic monitoring requirement   SIGNATURE    Dr. Brand Males, M.D., F.C.C.P,  Pulmonary and Critical Care Medicine Staff Physician, [place] Director - Interstitial Lung Disease  Program  Pulmonary [place] [facility]  [place], [place], [address]  Pager: [phone], If no answer or between  15:00h - 7:00h: call [phone] Telephone: [phone]  5:17 PM 04/07/2022

## 2022-04-07 NOTE — Patient Instructions (Addendum)
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[place]) Encounter for therapeutic drug monitoring Nausea due to drug  -  IPF slowly getting worse  Plan  - continue pirfenidone but give holiday due to fatiuige (See below)   - zofran as needed for nausea - monitor pulse ox even with exertion - goal >= 88% - IPF PRO registry visit 04/07/2022   Fatigue - significant  6-12 months Enlarged Pulmnary Arteries on CT Coronary artery calcification  Plan - check ck, aldolase, sed rate, quantifergon gold 04/07/2022 - will ask Dr Loralie Champagne to consider left heart evaluation as advised by PCP [name], MD  - will ask Dr Aundra Dubin to consider right heart cath - give pirfenidone holiday x 10 days  - then start at 2 pills three times daily x 1 week -> then 3 pills three times daily with food   Weight loss  STabilized since sept 2022 . Currently > 170#  Plan  - monitor  Followup - 2 months - 30 min visit with Dr [name]  - symptom score and walk test at followup

## 2022-04-07 NOTE — H&P (View-Only) (Signed)
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 He tells me that this summer 2018*noticing insidious onset of shortness of breath particularly with walking stairs in the house or [place]. This was not that in the previous years. The summer 2018 he was in [place] but did not notice much shortness of breath. Otherwise he is able to do his activities of daily living and played golf with a car. He is more bothered by his back pain and sciatica as a result of previous back surgery. Shortness of breath is extremely mild. He says he became more aware of it after the radiologic investigations documented below. He had a chest x-ray that suggested interstitial findings and therefore he underwent a high-resolution CT scan of the chest that is described below. I personally visualized this high resolution CT chest and to me it shows bilateral bibasal subpleural reticulation that also extends to the upper lobes without any zonal predominance. There is no obvious honeycombing but there is traction bronchiectasis. There no mediastinal adenopathy. Therefore he is been referred here. In terms of exposure history other than radiation exposure he has not been on any pulmonary toxic drugs or any mold exposure or asbestos exposure. Does have GERD and is on PPI and is well ocntrolled   Does have spring and perennial allergies - sneezes easy. Allergy test with [facility] negative some years ago  [employer] chest physicians interstitial lung disease questionnaire: He says that he does not cough. He is only trouble with dyspnea with strenuous exercise and it started 4 months ago. Past medical history significant for acid reflux. He has never smoked any tobacco or street drugs. Does not have any family history of lung disease. At  home he does not have any humidifier sound birds heart the water damage or mold. 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 He has standard radiation exposure while at work. The standard radiation for a radiologist. He does not take any pulmonary toxic drugs. House is 80 years old and he worries about mold. He sneezes if air is forced   Pulmonary function test shows isolated reduction in diffusion capacity to 62%. Walking desaturation test underneath her feet 3 laps on room air: Resting pulse ox 96%. Final pulse ox 95%. Heart rate resting was 92/m and went up to 109 minute.   Results for Kenneth [name] (MRN [id number]) as of 03/23/2017 10:04  Ref. Range 03/04/2017 09:13  FVC-Pre Latest Units: L 3.59  FVC-%Pred-Pre Latest Units: % 86  Results for Kenneth [name] (MRN [id number]) as of 03/23/2017 10:04  Ref. Range 03/04/2017 09:13  DLCO cor Latest Units: ml/min/mmHg 20.06  DLCO cor % pred Latest Units: % 63    Lungs/Pleura: There is subpleural reticulation with scattered ground-glass and traction bronchiolectasis, without a definite zonal predominance. No definitive honeycombing. Findings appear mildly progressive when radiographs dating back to 12/28/2008 are reviewed. Probable 5 mm subpleural lymph node along the right major fissure. No pleural fluid. Airway is unremarkable.      IMPRESSION: 1. Pulmonary parenchymal findings of interstitial lung disease which may be due to fibrotic nonspecific interstitial pneumonitis or, given slight progression over time, usual interstitial pneumonitis. 2. Aortic atherosclerosis (ICD10-170.0). Coronary artery calcification. 3. Aortic aneurysm NOS (ICD10-I71.9).  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 Recommend annual imaging followup by CTA or MRA. This recommendation follows 2010 ACCF/AHA/AATS/ACR/ASA/SCA/SCAI/SIR/STS/SVM Guidelines for the Diagnosis and Management of Patients with Thoracic  Aortic Disease. Circulation. 2010; 121: T614-E315. 4. Prominence of the right and left main pulmonary arteries can be seen with pulmonary arterial hypertension.     Electronically Signed   By: [name]   On: 02/17/2017 13:41      OV 03/23/2017  Chief Complaint  Patient presents with   Follow-up    SOB on exertion. Other than that he has been doing about the same. Denies any cough or CP.   Here to discuss results. [name]. 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 In inteirm no new symptoms.  Autoimmune profile negative. They raise possibility of 2nd opinion.  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 Progression is based on CXR comparison since 2010; there is no CT and is only suspicion of progression  Results for Kenneth [name] (MRN [id number]) as of 03/23/2017 10:04  Ref. Range 12/08/2011 16:44 03/04/2017 13:47  Anit Nuclear Antibody(ANA) Latest Ref Range: NEGATIVE   NEG  Angiotensin-Converting Enzyme Latest Ref Range: 9 - 67 U/L  31  Cyclic Citrullin Peptide Ab Latest Units: Units  <16  ds DNA Ab Latest Units: IU/mL  <1  ENA RNP Ab Latest Ref Range: 0.0 - 0.9 AI  0.2  Endomysial Screen Latest Ref Range: NEGATIVE  NEGATIVE   Deamidated Gliadin Abs, IgG Latest Ref Range: <20 U/mL 4.1   Gliadin IgA Latest Ref Range: <20 U/mL 1.8   RA Latex Turbid. Latest Ref Range: <14 IU/mL  <14  Tissue Transglut Ab Latest Ref Range: <20 U/mL 3.9   Tissue Transglutaminase Ab, IgA Latest Ref Range: <20 U/mL 2.2   IgE (Immunoglobulin E), Serum Latest Ref Range: <115 kU/L  31  IgA Latest Ref Range: 68 - 379 mg/dL 132   SSA (Ro) (ENA) Antibody, IgG Latest Ref Range: <1.0 NEG AI   <1.0 NEG  SSB (La) (ENA) Antibody, IgG Latest Ref Range: <1.0 NEG AI   <1.0 NEG    OV 05/06/2017  Chief Complaint  Patient presents with   Follow-up    Follow up today after beginning Esbriet. Pt has no c/o SOB,  cough, or CP. Has had some mild nausea from the [place] but other than that, he has been doing good on it.   ollow-up idiopathic pulmonary fibrosis mild severity  He is here to follow-up He is on the third week of his bed at 3 pills 3 times a day max dose. So far is tolerating it fine other than mild nausea. He does take after meals. He plans to meet with [facility] coordinator for medication support. He did visit [place] this past weekend and did have vomiting especially after consuming few etoh drinks at a [employer]. He does apply sunscreen [his daughter is a Paediatric nurse in [place]. He still complains about the fact that and is 80 year old home when the air conditioner comes on and they air blows out of the duct system he does sneeze 10 times and his feet does feel cold. She plans to put hepa filters that are high and and also get the duct cleaning. He says he did have allergy evaluation and found few years ago and it was negative. Recently he has increased his rpinrole and alsogot an injection  for his back and he does feel better   OV 06/22/2017  Chief Complaint  Patient presents with   Follow-up    Pt still taking Esbriet and has been doing good except has been having issues with nausea x3 days. PFT done today. Still becomes SOB with exertion. Denies any complaints of cough or CP.   IPF followup  Routine followup. Now on esbriet 3 pills tid since end oct 2018. Having new nausea - moderate, x 3 days. Might be chills. But no associated diarrhea or other side effects. Spacing esbriet 4h only. Also has complaitns about high co pay. Unable to find charity. Doing exrcise with trainer - feeels that is better and more vigorous than rehab. Gets tachy with eercise but says he does not desaturate. Had PFT - stable. Lot of questions about disease    Walking desaturation test on 06/22/2017 185 feet x 3 laps on ROOM AIR:  did NOT desaturate. Rest pulse ox was 100%, final pulse ox was 99%. HR  response 84/min at rest to 97/min at peak exertion. Patient Kenneth [name]  Did not Desaturate < 88% . Kenneth [name] did not  Desaturated </= 3% points. Marylyn Ishihara A Ortego yes did get tachyardic   OV 09/20/2017  Chief Complaint  Patient presents with   Follow-up    PFT done today.  Pt states he has been doing good. Pt still taking Esbriet and states he is doing good on it.   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  Joen Laura for IPF follow-up.  Last visit December 2018.  Since then he has been compliant fully with his Pirfenidone (Esbriet).  His nausea resolved with Ginger intake.  However he is having 3-5 pound weight loss and also fatigue towards the end of the day and also low appetite.  He is exercising vigorously 5 times a week and he is wondering if the fatigue could be because of the heavy exercise.  He is not interested in lung transplantation.  He is open to research participation in interventional trial.  Currently he is on the IPF registry program.  Overall he is well.  He did go to [place] to visit his son and he did not wear sunscreen and he did pick up a stage I skin burn with erythema that was more than usual.  But this resolved.  I did remind him about his obligation to wear sunscreen at all times with Pirfenidone (Esbriet).  He is interested in interventional research trials.  His pulmonary function test to me on an average is stable even though there is decline in FVC that seems to be an increase in DLCO so that is variability.  His walking desaturation test is around the same. He is interested in rolling over to the larger capsule of the Pirfenidone (Esbriet) so we will have to take it only 3 times a day. ESberit is cposting him $500/month or more due to high AGI and this is frustrating for him   Walking desaturation test on 09/20/2017 185 feet x 3 laps on ROOM AIR:  did walk briskly all 3 laps with only mild dyspnea desaturate. Rest pulse ox was 98%, final pulse ox was 98%. HR response 96/min at rest to  107/min at peak exertion. Patient SHAMERE CAMPAS  Did not Desaturate < 88% . Kenneth [name] did not  Desaturated </= 3% points. Guillermina [place] yes did get tachyardic this appears similar to a few months ago   OV 12/07/2017  Chief Complaint  Patient  presents with   Follow-up    Pt had pre Arlyce Harman and DLCO prior to OV today. Pt has increase of SOB with exertion more with walking up the hill.   Dr. Annamaria Boots presents for follow-up of his IPF to ILD clinic.  In terms of his IPF he feels stable.  In fact he tells me that if not for the incidental chest x-ray and a subsequent CT scan that picked up pulmonary fibrosis he even to this day will not know that he has IPF.  He only has a mild exertional dyspnea class I which she always believes in the past that it was because of aging.  At this point in time he is on Esbriet 1 big pill 3 times daily at full dose.  He is now applying sunscreen and has not had any further sunburns.  He is following an extremely intense exercise fitness regimen to improve his cardiac conditioning.  He believes his exertional heart rate and resting heart rate are much improved than before.  He works with a Clinical research associate at [employer].  His main issue is from Pirfenidone [employer]).  He had some mild nausea that has now resolved with ginger capsules but he continues to have some amount of fatigue low appetite and some mild weight loss.  He says this is mild and all tolerable.  The fatigue happens after working a lot in the yard and he loves yard work.  He wants to know if he can continue to do yard work.  Recently he did have some sinus congestion and took some steroids and antibiotics for it and he feels better but he still has some sinus fullness.  He had spirometry and simple walking desaturation test and the profile is below and I believe these are stable.  In fact his exertional heart rate is improved compared to previous.  Today he also participated in the IPF registry program.  There is a  noninterventional trial.  He is interested in future interventional trials.   OV 03/08/2018  Chief Complaint  Patient presents with   Follow-up    PFT performed today. Pt states he has been doing well except he has been having problems with nausea and dizziness, and loss of appetite.   Kenneth [name] - presents for follow-up. For his IPF. He is on esbruet now. He was tolerating his Pirfenidone (Esbriet) quite well other than minor side effects till last visit. However in thesummer of 2019e's had increased nauseaand also weight loss. He has lost 6 pounds since prior visit. Overall he is lost 11 pounds since September 2018. We started the medication esbeit  anti-fibrotic for him in October 2018. His pants are getting looser.he is also having significant fatigue. Nevertheless he does not want to switch to the of anti-fibrotic ofev because of prior history of irritable bowel syndrome. He says that his baseline irritable bowel syndrome is worse than any side effects he is having with esbriet especially the fatigue.e is open to a transplant evaluation and consideration of research protocols. His spirometry shows stability with FVC but his DLCO might be declining.Marland Kitchen His simple walk test did not show anychange. He is not noticing any subjective changes in dyspnea on effort tolerance while working out although he does admit to increased dizziness particularly when playing golf. During this time recently has not monitored his pulse ox but in the past and never went below 92%.  Other issues: He had some myalgia despite change in his statin. He will  address this with his primary care. 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 Primary care has ordered a follow-up CT scan.This can be combined with a high risk CT scan. I I have reached out to  the thoracic radiology     OV 05/10/2018  Subjective:  Patient ID: Kenneth [name], male , DOB: 1941/06/29 , age 89 y.o. , MRN: [id number] , ADDRESS: [place] [address]   05/10/2018 -   Chief Complaint  Patient presents with   Follow-up    review spiro with dlco.  c/o stable doe.  On 862m Esbriet TID, notes occ dizziness, loss of appetite.      HPI Kenneth SACCENTE89y.o. -returns-year follow-up.  Diagnosis made towards the end of 2018.  He has been on Esbriet since then.  His main issues have been weight loss.  He lost 10 pounds of weight at the time of last visit.  At the last visit he weighed 174 pounds in September 2019.  Back in August 2018 he weighed 186 pounds.  All this weight loss happen after he started Pirfenidone (Esbriet).  However he is now stabilized and he continues to be 874 pounds.  He is able to do activities of daily living and exercise but when he does stairs he gets dyspneic.  Overall he feels stable in the last 1 year in terms of his effort tolerance.  His main thing is that he has no appetite.  He has been communicating with the Pirfenidone (Esbriet) supporting about how to manage his low appetite and occasional nausea.  We discussed the options about taking brief holidays or reduction in dose.  He is willing to try this.  He does take occasional ginger.  Of note he had a transplant referral [facility] because he was out of network.  He tells me that his insurance company only advised that he would have a higher co-pay so is a little bit puzzled by this.  Since then he said conversation with his primary care physician and he wants to see Dr. DShana Chutewho is a son-in-law to his friend Dr. BLindwood Quaretired sPsychologist, sport and exercise  I know Dr. GLauris Chromanquite well from an IPF standpoint.  And therefore wholeheartedly supported the idea.  In terms of his lung function testing his FVC shows a decline although he is feeling the same.  On miniature walking test he seems a little more tachycardic but he says he feels the same.  He manages this heart rate with his exercise.  His  DLCO with some variability appears stable.  He had a CT angiogram for thoracic aortic aneurysm in September 2019 and this when compared to one year earlier has been reported as stable but the different resolutions.  I offered to do a high-resolution CT chest here but he wants to hold off until he saw Dr. GLauris Chroman  OV 08/23/2018  Subjective:  Patient ID: KWestley [name] male , DOB: 4May 12, 1942, age 81y.o. , MRN: [id number], [place] [id number]  08/23/2018 -   Chief Complaint  Patient presents with   Follow-up    PFT performed today. Pt states he has been doing well since last visit and denies any complaints.   IPF followup"   Diagnosis made towards the end of 2018.  He has been on Esbriet since then.   HPI KDhilan Brauer  Lasseigne 81 y.o. -Dr. Yang presents for follow-up of his IPF.  He continues to feel good.  His main issue is tolerating the Esbriet which gives some fatigue.  Sometimes he has to postpone it.  In terms of his dyspnea he continues to exercise.  His dyspnea symptom score is really minimal.  His cough is nonexistent.  He was losing weight with Esbriet but now he is gained his weight back.  In the interim he did see Dr. Daniel Gilstrap June 23, 2018 at [facility].  He had a satisfactory visit.  He thought he was stable.  His next visit with Dr. Gilstrap will be in June 2020.  Moving forward he wants to alternate every 6 months between myself and Dr. Gilstrap at [facility] which will give him 3 months with 1 pulmonologist or the other.  He is beginning to get interested in research studies.  We did pulmonary function test on him today and is documented below.  The FVC appears a fluctuant but the DLCO definitely appears on a downward trend.  The new DLCO is on a global lung initiated [GL I] equation and therefore the percent predicted is higher.  But the absolute value showed downward trend line.  We went over this.  He definitely does not feel this subjectively.  His  last high-resolution CT scan of the chest was in August of 2018.  He is willing to have one at follow-up.  He wants to see me again in 6 months.  His simple walking desaturation test appears stable.  He is thinking of making a trip to [place] and wants to know if he should wear a mask.    OV 04/21/2019  Subjective:  Patient ID: Kenneth [name], male , DOB: 04/03/1941 , age 78 y.o. , MRN: [id number] , ADDRESS: [address] [place] [address]   04/21/2019 -   Chief Complaint  Patient presents with   Follow-up    Patient reports that his breathing is doing well at this time.    IPF followup. On esbriet  HPI Kenneth [name] 81 y.o. -presents for follow-up of his idiopathic pulmonary fibrosis.  Last visit was pre-pandemic in February 2018.  In the interim he did see Dr. Daniel Gilstrap at [facility] and deemed to be stable.  At this point in time he is reporting continued stability with the symptoms as seen by the score below.  However I did notice that he is lost lot of weight.  His BMI with his clothes on is 25 with a weight of 170 pounds.  He says his dry weight is actually around 162 pounds.  He feels that this is the stable weight in the last 6 months.  He says he is not bothered by it.  He says this is because of low appetite because of the and pirfenidone.  He feels the pirfenidone is benefiting him and he continues to be is to be stable.  In fact his pulmonary function test shows " improvement".  He said that he felt the test today was somewhat variable and is effort based on the technician.  He had a recent high-resolution CT chest that shows continued stability.  I personally visualized this film.  At this point in time he is happy taking the pirfenidone.  But he did agree if there is further weight loss into the 150s pounds then we could reassess  Had a lot of discussion about COVID-19 and about appropriate risk reduction measures which   he is continuing to do.  He did some read some  literature about COVID-19 and had questions about it. He in Vit D supplementation and is agreeable to get his levels checked following literature about protective effect of Vit D  Other than that he reports 2 episodes of dizziness and in one of those he nearly passed out.  He was wondering if some of the symptoms were related to ropinirole and therefore he has stopped it.  I reviewed the literature with him and the significant amount of dizziness and hypotension.  And also GI symptoms which can probably accentuated with pirfenidone.   OV 10/16/2019  Subjective:  Patient ID: Kenneth [name], male , DOB: 1941-06-05 , age 89 y.o. , MRN: [id number] , ADDRESS: [place] [address]   10/16/2019 -   Chief Complaint  Patient presents with   Follow-up    PFT performed 3/31.  Pt states he is doing okay. States his breathing is about the same since last visit.     HPI Kenneth [name] 81 y.o.  -follow-up IPF on pirfenidone    -ROS   70-monthfollow-up for Dr. YAnnamaria Boots  He continues on pirfenidone.  He is taking full dose.  In the interim he has had hernia surgery 6 weeks ago for right inguinal hernia.  This is gone well.  He is change his exercise from bicycle to walking because of the hernia.  His symptom score is the same.  [facility] 2021 [facility]  Deemed to be stable.  I reviewed that note.  After that he has had pulmonary function test with uKoreacouple weeks ago.  This shows a decline.  He says the quality of instructions was poor and therefore the decline is because of that.  At least that is his suspicion.  He feels well.  Walking desaturation test is stable.  He has gained some weight.  He continues to have some nausea and fatigue and diarrhea with the pirfenidone.  But this is stable and manageable.  He feels his irritable bowel syndrome is active because of the pirfenidone.  He plans to see Dr. JJoanell Risingfor that.  His last liver  function test was with Dr. GLauris Chromanin January 2021.  This was normal per the note.  His last CT scan was towards the end of 2020 and deemed to be stable.     OV 04/11/2020   Subjective:  Patient ID: KWestley [name] male , DOB: 4November 11, 1942 age 81y.o. years. , MRN: [id number]  [place] [address] [id number]  PCrist Infante MD Providers : Dr GAlisia Ferrari Treatment Team:  Attending Provider: RBrand Males MD   Chief Complaint  Patient presents with   Follow-up    PFT performed today.  Pt states he has been doing well since last visit and denies any complaints.    Follow-up idiopathic pulmonary fibrosis.  Diagnosis towards the end of 2018.  On pirfenidone.  Last CT scan of the chest September 2020.   HPI KMANOLO BOSKET89y.o. -presents for follow-up last seen in April 2021.  After that in July 2021 he [facility]  I reviewed the notes.  The feeling was that he was stable.  He is only minimally symptomatic.  He had a 6-minute walk test and did really well without any significant desaturations.  He is now resorted  to high intensity interval training based on advice from a physical therapist/gym instructor.  He feels like stronger and failure.  He also states that his appetite is better and is eating better.  He feels his weight is stable.  In terms of his IPF symptoms is minimal and documented below.  He just has mild irritable bowel syndrome with his Esbriet and baseline health.  His walking desaturation test today in the office is stable.  His pulmonary function test was reviewed.  Over the last 3 years it shows a slight steady decline.  This decline is both in FVC and DLCO.  He is averaging around 2.6% decline per year with FVC while 8% decline in the DLCO per year.  On an average with a 3% decline per year in the last 3 years.  We did discuss this.  We discussed #pulmonary fibrosis foundation support group -he will visit the  website.  He gets newsletters from them.  At this point in time he is not interested in joining the support group but he did get the contact information for this and will reach out if he desires so.  #-We also discussed research as a care option -we discussed trial sponsored by [employer] the manufacturer of pirfenidone.  The trial is called [facility].  The investigation medical product is called PRM-[id number] in the axilla macrophage pathway.  Promising phase 2 data.  Research team gave him the consent form under my delegation.  He understands research is voluntary and is to evaluate unproven therapies.  He understands that the primary intent is to contribute to her scientific development and therapeutic gain is secondary because of an approved product the risks benefit ratio is very different compared to blood products.  He is going to read the consent and think about potential participation.  I sent him via email medical journal articles about the investigational product    He tells me that he feels his weight is stable.  His dry weight early in the morning is between 162-163 pounds.  I pointed out that his weight today is 168 pounds and is lower than before.  However he says that the difference rating 168-172 is related to clothing and his weight at home is stable.  He is not concerned about the slight weight loss.  Overall while he agrees he is lost weight since starting Esbriet he feels recently has been stable.  He wants to continue to monitor the situation  Irritable bowel: He said he will check with Dr. Perry about this  Other issues:  - He wants me to check his iron panel -He will do follow-up of his IPF-registry visit today  -for research-  -He has had his Covid booster    OV 10/22/2020  Subjective:  Patient ID: Kenneth [name], male , DOB: 02/21/1941 , age 79 y.o. , MRN: [id number] , ADDRESS: [address] [place] [address] PCP Perini, Mark, MD Patient Care Team: Perini, Mark, MD as  PCP - General (Internal Medicine) Robin Bass, RN as Oncology Nurse Navigator  This Provider for this visit: Treatment Team:  Attending Provider: Lynlee Stratton, MD    10/22/2020 -   Chief Complaint  Patient presents with   Follow-up    Fatigue from radiation treatments, more winded and more GI upset from radiation treatments    Follow-up idiopathic pulmonary fibrosis.  Diagnosis towards the end of 2018.  On pirfenidone.  Last CT scan of the chest September 2020.    HPI Kenneth   A [name] 81 y.o. -returns for follow-up.  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  He is finished hormonal treatment and now is on radiation treatment.  Radiation treatment is aggravating his GI side effects from Pirfenidone (Esbriet) and from irritable bowel syndrome.  Is also causing fatigue.  He went August a masters in walk the tournament.  This made him more fatigued than what he experienced few years ago.  He said it took him a few days to recover.  He used to have weight loss but this is now stabilized it is documented below.  His symptom severity score and his walking desaturation test are stable but his pulmonary function test continues to show decline.  His DLCO shows a relatively more decline but he said there were technique issues today.  He is got upcoming appointment with ILD clinic at [facility] in July.  At this point in time he is interested in phase 3 clinical trials.  We have quite a few to off as of summer 2022 but his prostate cancer could be an exclusion.  We will have the research team evaluate.  He is saddened by the loss of his friend Dr. March Rummage.  He is evaluating whether he should join the pulmonary fibrosis foundation at the national level to be a board member.     OV 04/03/2021  Subjective:  Patient ID: Kenneth [name], male , DOB: Oct 26, 1940 , age 28 y.o. , MRN: [id number] , ADDRESS: [place] [address] PCP [name], MD Patient Care Team: [name], MD as PCP - General (Internal Medicine) Cira Rue, RN as Oncology Nurse Navigator  This Provider for this visit: Treatment Team:  Attending Provider: Brand Males, MD    04/03/2021 -   Chief Complaint  Patient presents with   Follow-up    Just performed a partial PFT, patient states increased SOB.    Follow-up idiopathic pulmonary fibrosis.  Diagnosis towards the end of 2018.  On pirfenidone.  Last CT scan of the chest September 2020. - > sept 2022  Underlying irritable bowel syndrome  Initial weight loss with pirfenidone and then stabilized  HPI Kenneth [name] 81 y.o. -Dr. Annamaria Boots presents for his IPF follow-up.  Since I last saw him he has had several health issues.  He got diagnosed with prostate cancer and then had radiation to that.  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  He is going to have a colonoscopy.  He feels his irritable bowel syndrome pirfenidone and ultimately the radiation made his GI symptoms significantly worse.  In order to combat the fatigue he has been undergoing physical therapy.  He also try to get back to golf and took a golf swing for practice and then states that he had a compression fracture in the spine.  This is probably also because of years of steroid injections in his back.  In addition when he had a high-resolution CT chest in September 2022 this month there was some neck nodes and he has had a neck CT scan followed by ENT visit to [name].  Apparently fine-needle aspiration was nondiagnostic.  He is scheduled to undergo excision biopsy.  The combination of all this is gotten a little bit more anxious and depressed.  As you can see his symptom scores for depression and anxiety are up.  He is also feeling a little more short of breath than usual.  He  says his pulse ox is in the low 90s when he exerts himself but it still adequate.  Review of his symptoms and objective basis shows his symptom score  to be the same.  His walking desaturation test is also the same.  His weight is stable.  His pulmonary function test shows stability in diffusion but a decline in FVC compared to earlier this year.  Had a high-resolution CT chest that only shows minimal progression fibrosis over 2 years.  Echocardiogram September 2022 did not show any evidence of pulmonary hypertension     CLINICAL DATA:  Pulmonary fibrosis.   EXAM: CT CHEST WITHOUT CONTRAST   TECHNIQUE: Multidetector CT imaging of the chest was performed following the standard protocol without intravenous contrast. High resolution imaging of the lungs, as well as inspiratory and expiratory imaging, was performed.   COMPARISON:  03/28/2019, 03/30/2018 and 02/27/2017. Bone scan 05/21/2020.   FINDINGS: Cardiovascular: Atherosclerotic calcification of the aorta, aortic valve and coronary arteries. Enlarged pulmonic trunk and right and left main pulmonary arteries. Heart is at the upper limits of normal in size. No pericardial effusion.   Mediastinum/Nodes: Low left internal jugular lymph nodes measure up to 8 mm (2/8), previously 3 mm on 03/28/2019. Mediastinal lymph nodes are subcentimeter in short axis size, as before. Hilar regions are difficult to definitively evaluate without IV contrast. Left axillary lymph nodes measure up to 9 mm, are unchanged and not considered enlarged by CT size criteria. High left subpectoral lymph node measures 7 mm (2/11), increased from 4 mm on 03/28/2019. No right axillary adenopathy. Esophagus is grossly unremarkable. Periesophageal lymph nodes are subcentimeter in short axis size and appears similar.   Lungs/Pleura: Peripheral and basilar predominant subpleural reticulation, ground-glass and traction bronchiectasis/bronchiolectasis, minimally progressive from 03/28/2019. Evidence of progression is best seen in the lower lung zones. No pleural fluid. Airway is unremarkable. No air trapping.    Upper Abdomen: Visualized portions of the liver, gallbladder, adrenal glands, kidneys, spleen, pancreas, stomach and bowel are unremarkable.   Musculoskeletal: Degenerative changes in the spine. There is a new small rounded sclerotic lesion in the lateral aspect of the right third rib (sagittal image 32). New lucent lesion in the left posterolateral aspect of the T8 vertebral body measuring approximately 1.3 x 1.7 x 2.1 cm (2/84 and sagittal image 96). This likely corresponds to abnormal uptake seen on bone scan 05/21/2020. Finding is new from 03/28/2019. Slight compression of the T12 superior endplate is new.   IMPRESSION: 1. Slight interval progression of pulmonary fibrosis. Findings are consistent with UIP per consensus guidelines: Diagnosis of Idiopathic Pulmonary Fibrosis: An Official ATS/ERS/JRS/ALAT Clinical Practice Guideline. [place], Iss 5, [phone], Mar 06 2017. 2. New small sclerotic lesion in the lateral aspect of the right third rib, worrisome for metastatic prostate cancer. 3. Lucent lesion in the left posterolateral aspect of the T8 vertebral body corresponds to abnormal uptake on bone scan 05/21/2020, and is new from CT chest 03/28/2019, raising suspicion for malignancy. 4. Left internal and high left subpectoral lymph nodes are subcentimeter in size but appear slightly larger than on 03/28/2019. Difficult to exclude a lymphoproliferative disorder. Please refer to CT neck with contrast done the same day in further evaluation. 5. Slight compression of the T12 superior endplate is new but age indeterminate. 6. Aortic atherosclerosis (ICD10-I70.0). Coronary artery calcification. 7. Enlarged pulmonary arteries, indicative of pulmonary arterial hypertension.     Electronically Signed   By: [name]  On: 03/19/2021 15:12   Xxxxx  IMPRESSION: Neck CT September 2022 Mildly prominent lymph nodes in the neck bilaterally left  greater than right. Cluster of left supraclavicular and left posterior lymph nodes all under 1 cm.   Largest lymph node in the neck is a right level 3 lymph node measuring 12 mm on axial image 75.     Electronically Signed   By: Franchot Gallo M.D.   On: 03/19/2021 15:24   Xxxx IMPRESSIONS echocardiogram September 2022    1. Left ventricular ejection fraction, by estimation, is 65 to 70%. The  left ventricle has normal function. The left ventricle has no regional  wall motion abnormalities. Left ventricular diastolic parameters are  consistent with Grade I diastolic  dysfunction (impaired relaxation).   2. Right ventricular systolic function is normal. The right ventricular  size is normal. Tricuspid regurgitation signal is inadequate for assessing  PA pressure.   3. Left atrial size was mildly dilated.   4. The mitral valve is abnormal. Trivial mitral valve regurgitation. No  evidence of mitral stenosis.   5. The aortic valve is tricuspid. There is mild calcification of the  aortic valve. There is moderate thickening of the aortic valve. Aortic  valve regurgitation is not visualized. Mild aortic valve stenosis. Aortic  valve area, by VTI measures 1.62 cm.  Aortic valve mean gradient measures 12.0 mmHg. Aortic valve Vmax measures  2.31 m/s. DI is 0.43.   6. Aortic dilatation noted. There is borderline dilatation of the aortic  root, measuring 39 mm. There is borderline dilatation of the ascending  aorta, measuring 39 mm.   7. The inferior vena cava is normal in size with <50% respiratory  variability, suggesting right atrial pressure of 8 mmHg.   Comparison(s): Changes from prior study are noted. 03/02/2017: LVEF 60-65%,  mild LVH, grade 1 DD, aortic root 39 mm, no aortic stenosis.   OV 06/17/2021  Subjective:  Patient ID: Kenneth [name], male , DOB: 19-Nov-1940 , age 30 y.o. , MRN: [id number] , ADDRESS: 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     HPI Kenneth [name] 81 y.o. 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  Since his last visit from ILD standpoint he continues to be stable.  His walking desaturation test is stable.  However he is reporting more shortness of breath but this is directly related to more fatigue.  Review of the labs indicate in October 2022 he had mild anemia and also mild hyponatremia.  In the past radiation therapy for prostate made him extremely tired.  He is also on pirfenidone that can cause tiredness.  He now has lymphoma also that can cause tiredness.  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  He seen Dr. Kavin Leech.  They are planning to start R-CHOP treatment after Christmas.  He is frustrated by the recurrence of various medical illnesses.  The second cancer since his IPF diagnosis.  In terms of his IPF: He is now on generic pirfenidone.  He feels the nausea is worse.  He wants to switch to branded Esbriet.  Gave him donor sample of Esbriet by [employer].  Lot number [id number] with expiration June 2025 [unopened bottle], lot number [id number] with expiration 05/2023 [unopened bottle and also lot number [id number] with expiration 12/2023 [partial bottle].  He is asked me to send a message to the pharmacist to make the switch.  He says even if it is more expensive he will play for the branded Esbriet because it is better quality of life.  He is worried that nausea and other side effects will get worse  particularly once he starts chemotherapy.   No results found.   OV 09/18/2021  Subjective:  Patient ID: Kenneth [name], male , DOB: August 09, 1940 , age 44 y.o. , MRN: [id number] , ADDRESS: [place] [address] PCP [name], MD Patient Care Team: [name], MD as PCP - General (Internal Medicine) Cira Rue, RN as Oncology Nurse Navigator O'Kelley, Mindi Slicker, RN as Oncology Nurse Navigator  This Provider for this visit: Treatment Team:  Attending Provider: Brand Males, MD  Follow-up idiopathic pulmonary fibrosis.  Diagnosis towards the end of 2018.  On pirfenidone.  Last CT scan of the chest September 2020. - > sept 2022  Underlying irritable bowel syndrome  Initial weight loss with pirfenidone and then stabilized  History of prostate cancer  New diagnosis of B-cell lymphoma cervical lymphadenopathy -late 2022  Multifactorial fatigue  - mild anemia OCt 2022  - mild hypontermia OCt 2022  09/18/2021 -   Chief Complaint  Patient presents with   Follow-up    PFT performed today.  Pt states he is about the same since last visit. States that he is currently undergoing chemotherapy due to diagnosis of lymphoma.     HPI Kenneth [name] 81 y.o. -returns for follow-up.  He is taking Esbriet instead of generic pirfenidone.  He is also taking 3 little pills 3 times daily.  He finds that Esbriet to be better than generic pirfenidone.  He finds 3 little pills better than 1 whole pill in terms of nausea.  Still is dealing with a lot of nausea both from the chemo and Esbriet.  He says yesterday was really bad day with nausea.  He does take Zofran but we discussed and said he will try taking the Zofran 30 to 60 minutes before the Esbriet.  Nevertheless he has been able to gain some weight.  He is actually feeling slightly less short of breath.  He is able to do more particularly after his back pain has resolved.  Nevertheless he is on antiprostate cancer therapy.  He  is also undergoing chemotherapy from his B-cell lymphoma.  His mood is better.  He says he is socially isolating significantly because of his pulm fibrosis and cancer history.  He wanted to know if he could go out.  He did go to [employer] for show but wore a mask.  I did indicate to him that he could follow the CDC map to see prevalence of respiratory viruses and when the problem is overall low he could take more risk.  Also like being outdoors is less risky.  If he is endorsing particularly a cluster putting a mask on reduces his risk.  But I did say that he could use these guardrails and be more active so he can get some socialization and help his mood.  Can also give him purpose.  He is now sold his house and is planning to move into [facility].  In terms of his walking desaturation test and symptoms he stable.  In terms of his pulmonary function test his DLCO is stable but his FVC is down.  He is feeling similar.  Most of the decline seems to be from several years ago but most recently appears to be stable overall. We did discuss clinical trials but cancer generally is an exclusion but otherwise he would be interested.  We did discuss potential for doing inhaled treprostinil trial.  We did discuss the fact the neck step and standard of care would be oxygen if he were to decline but so far he has held good.  He recently lost his friend [facility] who also had pulmonary fibrosis.     He has upcoming [employer] appointment but he missed the 1 earlier in the year because of all ischemia.  He is thinking about holding off on going to [facility] and just following here.  I was fine with this.  CT Chest data  CT Chest data  No results found.     OV 10/28/2021  Subjective:  Patient ID: Kenneth [name], male , DOB: 1940-10-12 , age 2 y.o. , MRN: [id number] , ADDRESS: [place] [address] PCP [name], MD Patient Care Team: [name], MD as PCP - General  (Internal Medicine) Cira Rue, RN as Oncology Nurse Navigator O'Kelley, Mindi Slicker, RN as Oncology Nurse Navigator  This Provider for this visit: Treatment Team:  Attending Provider: Brand Males, MD    10/28/2021 -   Chief Complaint  Patient presents with   Acute Visit    Pt states since his last chemo treatment, he has been having problems with his breathing and also states that his pulse has been elevated.   Follow-up idiopathic pulmonary fibrosis.  Diagnosis towards the end of 2018.  On pirfenidone.  Last CT scan of the chest September 2020. 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 -presents with his wife.  Status meeting his wife for the first time but she reminded me that she had made him early on in the course of his illness.  He is finished cycles of R-CHOP treatment for his lymphoma.  On 10/23/2021 Thursday he saw a nurse practitioner in oncology.  At that point it was noticed that he had finished mini CHOP treatment 5 rounds 10/02/2021.  He did complete his 6 cycle.  He was feeling well.  His resting heart rate was 82.  Pulse ox at rest was 100%.  His white count was observed to be slightly low.  They discussed several options and decided to proceed with chemotherapy followed by [facility] single dose on 10/24/2021 Friday.  He was doing fairly okay then on Sunday, 10/26/2021 he noticed resting tachycardia heart rate 125 but no fever.  At no point any fever.  No cough no chills no nausea no vomiting no diarrhea.  The same thing persisted even on Monday, 10/27/2021.  He noticed that if he walked his heart rate was up to 140 he became short of breath and stop.  His pulse ox  would drop to 90% but no abnormal  desaturations.  He started getting worried.  He went to [place] yesterday 10/27/2021.  EKG reviewed sinus tachycardia with some ectopic atrial beats.  Nothing acute.    Labs: Found to have mild hyponatremia.  Also found to have mild anemia white count in response to Udenyca went from low to  high.No D-dimer.  No troponin no BNP checked.  Last echo and CT scan of the chest in September 2022.  Walking desaturation test is definitely more tachycardic than baseline.  Associated with this he is also got exaggerated pulse ox drop although still adequate for simple walking test.  His pace is slower.   We repeated an EKG here: Personally visualized.  Sinus tachycardia heart rate 120 approximately.  Nothing acute.         OV 01/09/2022  Subjective:  Patient ID: Kenneth [name], male , DOB: 09/12/1940 , age 81 y.o. , MRN: [id number] , ADDRESS: [address] [place] [address] PCP Perini, Mark, MD Patient Care Team: Perini, Mark, MD as PCP - General (Internal Medicine) Robin Bass, RN as Oncology Nurse Navigator O'Kelley, Wendy P, RN as Oncology Nurse Navigator  This Provider for this visit: Treatment Team:  Attending Provider: Falecia Vannatter, MD    01/09/2022 -   Chief Complaint  Patient presents with   Follow-up    PFT performed today. Pt states that he feels like he may be a little worse since last visit. States that he has had some problems with congestion which he wonders could be due to the weather.   HPI Asaph A Magnone 81 y.o. -Dr. Gerken returns for follow-up.  At this point in time he is finished his chemotherapy.  In April 2023 he did have some tachycardic response following the last chemo.  Cardiac work-up ended up being fine.  He says overall he is been feeling fine.  He is now relocated to a retirement home in [place].  [place]  During this time his pulse ox immediately after exertion was always in the  90s.  He did not feel that his IPF was getting worse when he was there.  He just came back yesterday.  However for the last few days he is having increased fatigue nasal congestion.  He also feels his left ear is blocked.  He tried to do pulmonary function test today.  He felt he did not give a good effort particularly the DLCO.  Consistent with this his numbers are much worse.  His FVC shows a 7% decline compared to March 2023 and DLCO 14% decline compared to March 2023.  His symptom score seems similar to September 2022.  His walking desaturation test is also stable on this level ground.  His weight loss is stabilized since he is at 168 pounds.  He is frustrated by this.  Is also frustrated by his multiple health issues.  The pirfenidone he is taking the generic version.  He is tolerating it but is requiring Zofran which then causes constipation.  Emotionally he is looking for a break from all his health issues  He plans to [place] the mountains [place] today.     01/26/2022: Today - follow up Patient presents today for follow up after being treated for sinusitis. He reports feeling better but still has issues with nasal congestion and pressure in his left ear. They did improve   with prednisone taper. These are problems he has been dealing with for a while now. He has tried multiple different nasal sprays with some relief. 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 He is sure this was the result of his worsening pulmonary function testing. He is working on getting in to see [name] with ENT. Otherwise, no concerns today. Breathing is overall stable. He gets winded with stair climbing but otherwise, no issues. Doesn't feel like his activity is limited by his breathing. He's planning to go back [place] the mountains in the next few weeks.     OV 04/07/2022  Subjective:  Patient ID: Kenneth [name], male , DOB: 12-21-1940 , age 80 y.o. , MRN: [id number] , ADDRESS: [address] [place] [facility]  [address] PCP [name], MD Patient Care Team: [name], MD as PCP - General (Internal Medicine) Cira Rue, RN as Oncology Nurse Navigator O'Kelley, Mindi Slicker, RN as Oncology Nurse Navigator  This Provider for this visit: Treatment Team:  Attending Provider: Brand Males, MD   Follow-up idiopathic pulmonary fibrosis.  Diagnosis towards the end of 2018.  On pirfenidone.  Last CT scan of the chest September 2020. 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 Breathing has been gradually worse since the last visit. He has had some muscle fatigue with exertion. He has some occ cough- non prod.      HPI Kenneth [name] 81 y.o. -returns for follow-up.  He feels he is slowly progressing in terms of his IPF.  In fact his shortness of breath scores are slightly worse.  Last time his pulmonary function test showed a decline he thought was from sinusitis.  Sinusitis is cleared up but he says today when he did his PFTs he felt that he was declining.  He did have some cough he is having some new cough for the last few weeks.  He is worried this might be from worsening IPF but he typically gets cough this time of the year from fall allergy season.  Pulmonary function test shows an improvement compared to the most recent one but overall compared to earlier this year there is a decline.  I shared these results with him.  He is more worried about his fatigue.  However he did play some golf.  He says that he is significantly fatigued.  He has been going on for 6 to 12 months initially thought was related to cancer and chemotherapy but he says it is the  all the time.  The fatigue persist despite anemia correction despite his chemotherapy completing is still got fatigue.  He feels it happens episodically but then it is there all over the body and it exhausting.  He believes his primary care is checked his vitamin D levels because he is on replacement.  He believes his TSH and hemoglobin A1c to be normal.  Apparently primary care physician has asked him to consider getting a CT coronaries.  He has seen Dr. Loralie Champagne in the past.  At the time it was from acute chemotherapy consideration.  Dr. Aundra Dubin at the time got an echocardiogram.  He  does have enlarged pulmonary artery on the CT scan and he does have coronary artery calcification.  Back then in the spring during chemo side effects patient had deferred right heart catheterization.  This time of reach out to Dr. 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 _7  How bad is your fatigue 1 due to esbriet 0 2 0 2 1 3._8 nausea   1 0 _9 0 1 1  vomit   0 0 0 0 0 0 0 0  diarrhea   _10 0 1 1  anxiety   0 0 0 _11 0  depresso0   0 0 0 _12 0        Simple office walk 185 feet x  3 laps goal with forehead probe 12/07/2017 Weight 180# 03/08/2018 Weight 174.9  05/10/2018 Weight 174# 08/23/2018 Weight 178# 04/21/2019 Weight 170# 10/16/2019 173# 04/11/2020 168# 10/22/2020 170# 04/03/2021 168# 06/17/2021 168# 09/18/2021  10/28/2021  01/09/2022 168#   O2 used _13  Room air   ra ra ra  ra   Number laps completed _14 Comments about pace Moderate pace Mod pace Mod fast pace fast  fast   fast   Avg pace - slower than before    Resting Pulse Ox/HR 100% and 83/min 100% ad 85/min 100% and 91/min 100% and 82/min  100% asnd 91 100% and 83/min 98% and 94/min 100% and 82 100% and 93 100% and 78 99% and H 126 99% and HR 88   Final Pulse Ox/HR 99% and 98/min 98$ and 104/min 98% and 108/min 98% and 100/min  96% and 106/mom 98% and 96/mn 95% and 110.min 97% and 105 97% ad 109 97% and 94 92% and HR 143 97% and HR 111   Desaturated </= 88% no no no no  no          Desaturated <= 3% points no no ni no  yesm 4 points  Yes, 3 points Yes, 3 poitn Yes 3 pomnts Yes 3 Yes, 7 points    Got Tachycardic >/= 90/min yes yes yes yes  yes  _15   Symptoms at end of test No complaints No complaints  No complaints  Mild dyspnea  no Mild dyspnea Mild dyspnea Mild dysnea Modeate dyspnea    Miscellaneous comments x ? sligh increase in tachy/stable ? incraeased tachty       Stable since April 2021           PFT     Latest Ref Rng & Units 04/07/2022    1:46 PM 01/09/2022    9:17 AM 09/18/2021    8:52 AM 04/03/2021    9:21 AM 10/21/2020    8:56 AM 04/11/2020   10:04 AM 10/04/2019    9:32 AM  PFT Results  FVC-Pre L 2.53  P 2.49  2.68  2.96  3.20  3.26  3.35   FVC-Predicted Pre % 68  P 64  68  75  81  82  83   Pre FEV1/FVC % % 86  P 87  84  81  81  80  82   FEV1-Pre L 2.18  P 2.16  2.25  2.38  2.59  2.62  2.74   FEV1-Predicted Pre % 83  P 78  80  85  91  92  95   DLCO uncorrected ml/min/mmHg 12.36  P 12.55  13.07  16.15  13.64  17.26  16.69   DLCO UNC% % 53  P 53  54  67  56  72  69   DLCO corrected ml/min/mmHg  13.57  P 12.55  14.64  16.15  13.64  17.26  17.31   DLCO COR %Predicted % 59  P 53  61  67  56  72  71   DLVA Predicted % 89  P 91  105  91  80  94  91     P Preliminary result       has a past medical history of Arthritis, Diverticulosis of colon (without mention of hemorrhage), GERD (gastroesophageal reflux disease), Hiatal hernia, HLD (hyperlipidemia), Internal hemorrhoids without mention of complication, Irritable bowel syndrome, Other specified gastritis without mention of hemorrhage, Prostate cancer (HCC), Pulmonary fibrosis (HCC), and Stress fracture (02/2021).   reports that he has never smoked. He has never used smokeless tobacco.  Past Surgical History:  Procedure Laterality Date   CARPAL TUNNEL RELEASE     left   CATARACT EXTRACTION  06/2011   bilateral   DEEP NECK LYMPH NODE BIOPSY / EXCISION  03/25/2021   INGUINAL HERNIA REPAIR Right 09/05/2019   Procedure: OPEN REPAIR RIGHT INGUINAL HERNIA WITH MESH;  Surgeon: Gerkin, Todd, MD;  Location: [facility];  Service: General;  Laterality: Right;   LUMBAR DISC SURGERY  1986, 1995   x 2   LUMBAR LAMINECTOMY/DECOMPRESSION MICRODISCECTOMY Left 07/11/2013   Procedure: LUMBAR ONE TO TWO, LUMBAR TWO TO THREE, LUMBAR THREE TO FOUR LUMBAR LAMINECTOMY/DECOMPRESSION MICRODISCECTOMY 3 LEVELS;  Surgeon: Henry A Pool, MD;  Location: [facility];  Service: Neurosurgery;  Laterality: Left;   NASAL SINUS SURGERY      No Known Allergies  Immunization History  Administered Date(s) Administered   Fluad Quad(high Dose 65+) 03/18/2020   Influenza Split 10/03/2009, 03/28/2010, 04/14/2011, 03/22/2012, 03/09/2013, 03/28/2014   Influenza, High Dose Seasonal PF 03/19/2016, 03/06/2017, 04/09/2018, 03/07/2019, 04/06/2020   Influenza, Quadrivalent, Recombinant, Inj, Pf 03/12/2018, 03/10/2019, 04/06/2020   Influenza,inj,Quad PF,6+ Mos 04/04/2014, 03/26/2015, 04/05/2016   Influenza-Unspecified 04/05/2016, 04/05/2021   PFIZER Comirnaty(Gray Top)Covid-19  Tri-Sucrose Vaccine 10/15/2020   PFIZER(Purple Top)SARS-COV-2 Vaccination 07/20/2019, 08/10/2019, 02/27/2020, 03/23/2021   Pneumococcal Conjugate-13 03/30/2013, 07/31/2013,   04/26/2018   Pneumococcal Polysaccharide-23 10/03/2009, 01/14/2010, 03/31/2018, 02/18/2021   Pneumococcal-Unspecified 04/05/2016   Td 10/20/2016   Tdap 10/03/2009   Zoster Recombinat (Shingrix) 01/03/2017   Zoster, Live 10/03/2009    Family History  Problem Relation Age of Onset   Heart failure Father 74   Bladder Cancer Mother    Breast cancer Mother    Colon cancer Neg Hx    Pancreatic cancer Neg Hx    Rectal cancer Neg Hx    Stomach cancer Neg Hx    Prostate cancer Neg Hx      Current Outpatient Medications:    acetaminophen-codeine (TYLENOL #3) 300-30 MG per tablet, Take 1 tablet by mouth every 8 (eight) hours as needed for moderate pain., Disp: , Rfl:    aspirin EC 81 MG tablet, Take 81 mg by mouth 3 (three) times a week., Disp: , Rfl:    Bacillus Coagulans-Inulin (ALIGN PREBIOTIC-PROBIOTIC PO), Take by mouth., Disp: , Rfl:    carisoprodol (SOMA) 350 MG tablet, Take 175 mg by mouth 3 (three) times daily as needed for muscle spasms., Disp: , Rfl:    Cholecalciferol (VITAMIN D-3) 1000 UNITS CAPS, Take 2,000 Units by mouth every evening., Disp: , Rfl:    CO-ENZYME Q-10 PO, Take 1 tablet by mouth every evening. , Disp: , Rfl:    Cyanocobalamin (VITAMIN B12) 1000 MCG TBCR, Take 1,000 mcg by mouth daily., Disp: , Rfl:    denosumab (PROLIA) 60 MG/ML SOSY injection, Inject 60 mg into the skin every 6 (six) months. At [place], Disp: , Rfl:    diclofenac (VOLTAREN) 75 MG EC tablet, Take 75 mg by mouth 2 (two) times daily as needed (pain.)., Disp: , Rfl: 2   diclofenac sodium (VOLTAREN) 1 % GEL, Apply 4 g topically 4 (four) times daily as needed (pain.)., Disp: , Rfl: 12   dicyclomine (BENTYL) 10 MG capsule, Take one by mouth every 6 hours as needed for abdominal cramping (Patient taking differently: Take  10 mg by mouth every 6 (six) hours as needed (abdominal spasms.).), Disp: 30 capsule, Rfl: 1   diphenoxylate-atropine (LOMOTIL) 2.5-0.025 MG tablet, Take 1 tablet by mouth 4 (four) times daily as needed for diarrhea or loose stools., Disp: , Rfl:    Fluticasone Propionate (XHANCE) 93 MCG/ACT EXHU, 1 spray in each nostril, Disp: , Rfl:    Glucosamine Sulfate 500 MG TABS, Take 500 mg by mouth daily., Disp: , Rfl:    HYDROcodone-acetaminophen (NORCO/VICODIN) 5-325 MG tablet, Take 1-2 tablets by mouth every 4 (four) hours as needed for moderate pain., Disp: 24 tablet, Rfl: 0   leuprolide, 6 Month, (ELIGARD) 45 MG injection, Inject 45 mg into the skin every 6 (six) months. Per Dr. Tresa Moore office, Disp: , Rfl:    loratadine (CLARITIN) 10 MG tablet, Take 10 mg by mouth daily., Disp: , Rfl:    meclizine (ANTIVERT) 25 MG tablet, Take 25 mg by mouth 3 (three) times daily as needed for dizziness. , Disp: , Rfl:    methocarbamol (ROBAXIN) 500 MG tablet, take 1 tab 4 times daily prn spasm, Disp: , Rfl:    Multiple Vitamin (MULTIVITAMIN) tablet, Take 1 tablet by mouth daily., Disp: , Rfl:    omeprazole (PRILOSEC) 40 MG capsule, Take 1 capsule (40 mg total) by mouth daily., Disp: 30 capsule, Rfl: 11   ondansetron (ZOFRAN) 8 MG tablet, TAKE 1 TABLET (8 MG TOTAL) BY MOUTH EVERY 8 (EIGHT) HOURS AS NEEDED FOR NAUSEA OR VOMITING. START 72 HOURS AFTER  IV CHEMOTHERAPY ADMINISTRATION, Disp: 30 tablet, Rfl: 1   oxybutynin (DITROPAN) 5 MG tablet, Take 5 mg by mouth every 8 (eight) hours as needed., Disp: , Rfl:    Pirfenidone (ESBRIET) 801 MG TABS, Take 1 tablet by mouth 3 (three) times daily with meals., Disp: 270 tablet, Rfl: 1   prochlorperazine (COMPAZINE) 10 MG tablet, Take 1 tablet (10 mg total) by mouth every 6 (six) hours as needed for nausea., Disp: 60 tablet, Rfl: 1      Objective:   Vitals:   04/07/22 1426  BP: 112/60  Pulse: 90  Temp: 98.2 F (36.8 C)  TempSrc: Oral  SpO2: 97%  Weight: 173 lb (78.5 kg)   Height: _0  (1.727 m)    Estimated body mass index is 26.3 kg/m as calculated from the following:   Height as of this encounter: _1  (1.727 m).   Weight as of this encounter: 173 lb (78.5 kg).  _2 @  Filed Weights   04/07/22 1426  Weight: 173 lb (78.5 kg)     Physical Exam    General: No distress. Looks well Neuro: Alert and Oriented x 3. GCS 15. Speech normal Psych: Pleasant Resp:  Barrel Chest - no.  Wheeze - no, Crackles - yes base, No overt respiratory distress CVS: Normal heart sounds. Murmurs - no Ext: Stigmata of Connective Tissue Disease - no HEENT: Normal upper airway. PEERL +. No post nasal drip        Assessment:       ICD-10-CM   1. 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HCC)  J84.112 CK (Creatine Kinase)    Aldolase    QuantiFERON-TB Gold Plus    Sed Rate (ESR)    Sed Rate (ESR)    QuantiFERON-TB Gold Plus    Aldolase    CK (Creatine Kinase)    2. 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 Other fatigue  R53.83     4. Encounter for therapeutic drug monitoring  Z51.81          Plan:     Patient Instructions  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[place]) Encounter for therapeutic drug monitoring Nausea due to drug  -  IPF slowly getting worse  Plan  - continue pirfenidone but give holiday due to fatiuige (See below)   - zofran as needed for nausea - monitor pulse ox even with exertion - goal >= 88% - IPF PRO registry visit 04/07/2022   Fatigue - significant  6-12 months Enlarged Pulmnary Arteries on CT Coronary artery calcification  Plan - check ck, aldolase, sed rate, quantifergon gold 04/07/2022 - will ask Dr Loralie Champagne to consider left heart evaluation as advised by PCP [name], MD  - will ask Dr Aundra Dubin to consider right heart cath - give pirfenidone holiday x 10 days  - then start at 2 pills three times daily x 1 week -> then 3 pills three times daily with food   Weight loss  STabilized since sept 2022  . Currently > 170#  Plan  - monitor  Followup - 2 months - 30 min visit with Dr [name]  - symptom score and walk test at followup  High complex medical condition requiring high risk prescription with intensive therapeutic monitoring requirement   SIGNATURE    Dr. Brand Males, M.D., F.C.C.P,  Pulmonary and Critical Care Medicine Staff Physician, [place] Director - Interstitial Lung Disease  Program  Pulmonary [place] [facility]  [place], [place], [address]  Pager: [phone], If no answer or between  15:00h - 7:00h: call [phone] Telephone: [phone]  5:17 PM 04/07/2022

## 2022-04-07 NOTE — Progress Notes (Signed)
Spirometry and DLCO Performed Today.  

## 2022-04-07 NOTE — Telephone Encounter (Signed)
[place]  Thanks. I concur. I have emailed him as such  MR

## 2022-04-07 NOTE — Patient Instructions (Signed)
Spirometry and DLCO Performed Today.  

## 2022-04-07 NOTE — Telephone Encounter (Signed)
Kenneth [name]  [name] is having declining lung function.  He is now agreeable to have a right heart catheterization because of his enlarged pulmonary arteries particular in the face of declining PFT.  Please evaluate him for this.  Also he is got a lot of fatigue which could be from his drug pirfenidone but his primary care physician [name], MD apparently raised the point about him having CT coronaries to evaluate his left heart.  I do not know much about the literature on this.  I did tell him that I would defer this question to you but bring it to your attention   Thanks    SIGNATURE    Dr. Brand Males, M.D., F.C.C.P,  Pulmonary and Critical Care Medicine Staff Physician, [place] Director - Interstitial Lung Disease  Program  Medical Director - [facility] [place] at [place], [place], [address]   Pager: [phone], If no answer  -[place] or [place] [phone] Telephone (clinical office): [phone] Telephone (research): [phone]  3:21 PM 04/07/2022

## 2022-04-08 ENCOUNTER — Encounter (HOSPITAL_COMMUNITY)
Admission: RE | Admit: 2022-04-08 | Discharge: 2022-04-08 | Disposition: A | Discharge status: Home / Self Care | Payer: PPO | Source: Ambulatory Visit | Attending: Internal Medicine | Admitting: Internal Medicine

## 2022-04-08 ENCOUNTER — Encounter (HOSPITAL_COMMUNITY): Payer: Self-pay

## 2022-04-08 ENCOUNTER — Telehealth (HOSPITAL_COMMUNITY): Payer: Self-pay

## 2022-04-08 DIAGNOSIS — M810 Age-related osteoporosis without current pathological fracture: Secondary | ICD-10-CM | POA: Diagnosis not present

## 2022-04-08 LAB — CK: Total CK: 98 U/L (ref 7–232)

## 2022-04-08 LAB — SEDIMENTATION RATE: Sed Rate: 1 mm/hr (ref 0–20)

## 2022-04-08 MED ORDER — DENOSUMAB 60 MG/ML ~~LOC~~ SOSY
60.0000 mg | PREFILLED_SYRINGE | Freq: Once | SUBCUTANEOUS | Status: AC
Start: 2022-04-08 — End: 2022-04-08

## 2022-04-08 MED ORDER — DENOSUMAB 60 MG/ML ~~LOC~~ SOSY
PREFILLED_SYRINGE | SUBCUTANEOUS | Status: AC
  Administered 2022-04-08: 60 mg via SUBCUTANEOUS
  Filled 2022-04-08: qty 1

## 2022-04-08 NOTE — Telephone Encounter (Signed)
Kenneth [name] is in agreement for both [facility] and [facility]. Feels something is not qute right  Thanks    SIGNATURE    Dr. Brand Males, M.D., F.C.C.P,  Pulmonary and Critical Care Medicine Staff Physician, [place] Director - Interstitial Lung Disease  Program  Medical Director - [place] [place] at [place], [place], [address]   Pager: [phone], If no answer  -[place] or [place] [phone] Telephone (clinical office): [phone] Telephone (research): [phone]  11:30 AM 04/08/2022

## 2022-04-08 NOTE — Telephone Encounter (Signed)
Patient came in to schedule his right heart cath with Dr. Aundra Dubin. Patient instructions via mychart as discussed with patient in office.    You are scheduled for a Cardiac Catheterization on Tuesday, October 10 with Dr. Loralie Champagne.  1. Please arrive at the [facility] (Main Entrance A) at [facility]: [address] [place], [place] [address] at 10:00 AM (This time is two hours before your procedure to ensure your preparation). Free valet parking service is available.   Special note: Every effort is made to have your procedure done on time. Please understand that emergencies sometimes delay scheduled procedures.  2. Diet: Do not eat solid foods after midnight.  The patient may have clear liquids until 5am upon the day of the procedure.  3. Medication instructions in preparation for your procedure:   On the morning of your procedure, take your Aspirin 81 mg and any morning medicines NOT listed above.  You may use sips of water.  4. Plan for one night stay--bring personal belongings. 5. Bring a current list of your medications and current insurance cards. 6. You MUST have a responsible person to drive you home. 7. Someone MUST be with you the first 24 hours after you arrive home or your discharge will be delayed. 8. Please wear clothes that are easy to get on and off and wear slip-on shoes.  Thank you for allowing [place] to care for you!   -- [facility]

## 2022-04-08 NOTE — Telephone Encounter (Signed)
Cath scheduled.  

## 2022-04-08 NOTE — Telephone Encounter (Signed)
I spoke with Dr. Annamaria Boots today by phone about the right and left heart catheterization/possible PCI.  We discussed risks/benefits and he agrees to procedure.

## 2022-04-09 LAB — ALDOLASE: Aldolase: 3.7 U/L (ref <=8.1)

## 2022-04-10 LAB — QUANTIFERON-TB GOLD PLUS
Mitogen-NIL: >10 IU/mL
NIL: 0.03 IU/mL
QuantiFERON-TB Gold Plus: NEGATIVE
TB1-NIL: <0 IU/mL
TB2-NIL: <0 IU/mL

## 2022-04-11 DIAGNOSIS — Z23 Encounter for immunization: Secondary | ICD-10-CM | POA: Diagnosis not present

## 2022-04-14 ENCOUNTER — Encounter (HOSPITAL_COMMUNITY)
Admission: RE | Disposition: A | Discharge status: Home / Self Care | Payer: Self-pay | Source: Home / Self Care | Attending: Cardiology

## 2022-04-14 ENCOUNTER — Ambulatory Visit (HOSPITAL_COMMUNITY)
Admission: RE | Admit: 2022-04-14 | Discharge: 2022-04-14 | Disposition: A | Discharge status: Home / Self Care | Payer: PPO | Attending: Cardiology | Admitting: Cardiology

## 2022-04-14 ENCOUNTER — Other Ambulatory Visit (HOSPITAL_COMMUNITY): Payer: Self-pay

## 2022-04-14 DIAGNOSIS — J84112 Idiopathic pulmonary fibrosis: Secondary | ICD-10-CM | POA: Diagnosis not present

## 2022-04-14 DIAGNOSIS — R0609 Other forms of dyspnea: Secondary | ICD-10-CM | POA: Diagnosis not present

## 2022-04-14 DIAGNOSIS — R5383 Other fatigue: Secondary | ICD-10-CM | POA: Diagnosis not present

## 2022-04-14 DIAGNOSIS — I5022 Chronic systolic (congestive) heart failure: Secondary | ICD-10-CM

## 2022-04-14 DIAGNOSIS — Z79899 Other long term (current) drug therapy: Secondary | ICD-10-CM | POA: Insufficient documentation

## 2022-04-14 DIAGNOSIS — I2510 Atherosclerotic heart disease of native coronary artery without angina pectoris: Secondary | ICD-10-CM | POA: Diagnosis not present

## 2022-04-14 DIAGNOSIS — I288 Other diseases of pulmonary vessels: Secondary | ICD-10-CM | POA: Diagnosis not present

## 2022-04-14 DIAGNOSIS — I272 Pulmonary hypertension, unspecified: Secondary | ICD-10-CM | POA: Diagnosis not present

## 2022-04-14 HISTORY — PX: RIGHT/LEFT HEART CATH AND CORONARY ANGIOGRAPHY: CATH118266

## 2022-04-14 LAB — BASIC METABOLIC PANEL
Anion gap: 6 (ref 5–15)
BUN: 14 mg/dL (ref 8–23)
CO2: 27 mmol/L (ref 22–32)
Calcium: 8.6 mg/dL — ABNORMAL LOW (ref 8.9–10.3)
Chloride: 102 mmol/L (ref 98–111)
Creatinine, Ser: 0.7 mg/dL (ref 0.61–1.24)
GFR, Estimated: >60 mL/min (ref >60)
Glucose, Bld: 118 mg/dL — ABNORMAL HIGH (ref 70–99)
Potassium: 4.2 mmol/L (ref 3.5–5.1)
Sodium: 135 mmol/L (ref 135–145)

## 2022-04-14 LAB — POCT I-STAT EG7
Acid-Base Excess: 0 mmol/L (ref 0.0–2.0)
Bicarbonate: 25.1 mmol/L (ref 20.0–28.0)
Calcium, Ion: 1.14 mmol/L — ABNORMAL LOW (ref 1.15–1.40)
HCT: 32 % — ABNORMAL LOW (ref 39.0–52.0)
Hemoglobin: 10.9 g/dL — ABNORMAL LOW (ref 13.0–17.0)
O2 Saturation: 75 %
Potassium: 4.2 mmol/L (ref 3.5–5.1)
Sodium: 140 mmol/L (ref 135–145)
TCO2: 26 mmol/L (ref 22–32)
pCO2, Ven: 42.4 mmHg — ABNORMAL LOW (ref 44–60)
pH, Ven: 7.381 (ref 7.25–7.43)
pO2, Ven: 41 mmHg (ref 32–45)

## 2022-04-14 LAB — CBC
HCT: 34.8 % — ABNORMAL LOW (ref 39.0–52.0)
Hemoglobin: 12.4 g/dL — ABNORMAL LOW (ref 13.0–17.0)
MCH: 32.5 pg (ref 26.0–34.0)
MCHC: 35.6 g/dL (ref 30.0–36.0)
MCV: 91.3 fL (ref 80.0–100.0)
Platelets: 191 10*3/uL (ref 150–400)
RBC: 3.81 MIL/uL — ABNORMAL LOW (ref 4.22–5.81)
RDW: 13.1 % (ref 11.5–15.5)
WBC: 3.7 10*3/uL — ABNORMAL LOW (ref 4.0–10.5)
nRBC: 0 % (ref 0.0–0.2)

## 2022-04-14 SURGERY — RIGHT/LEFT HEART CATH AND CORONARY ANGIOGRAPHY
Anesthesia: LOCAL

## 2022-04-14 MED ORDER — IOHEXOL 350 MG/ML SOLN
INTRAVENOUS | Status: DC | PRN
  Administered 2022-04-14: 40 mL

## 2022-04-14 MED ORDER — HEPARIN (PORCINE) IN NACL 1000-0.9 UT/500ML-% IV SOLN
INTRAVENOUS | Status: DC | PRN
  Administered 2022-04-14 (×2): 500 mL

## 2022-04-14 MED ORDER — SODIUM CHLORIDE 0.9 % IV SOLN: INTRAVENOUS | Status: DC

## 2022-04-14 MED ORDER — MIDAZOLAM HCL 2 MG/2ML IJ SOLN
INTRAMUSCULAR | Status: DC | PRN
  Administered 2022-04-14: 1 mg via INTRAVENOUS

## 2022-04-14 MED ORDER — SODIUM CHLORIDE 0.9% FLUSH: 3.0000 mL | Freq: Two times a day (BID) | INTRAVENOUS | Status: DC

## 2022-04-14 MED ORDER — SODIUM CHLORIDE 0.9 % IV SOLN: 250.0000 mL | INTRAVENOUS | Status: DC | PRN

## 2022-04-14 MED ORDER — LABETALOL HCL 5 MG/ML IV SOLN: 10.0000 mg | INTRAVENOUS | Status: DC | PRN

## 2022-04-14 MED ORDER — SODIUM CHLORIDE 0.9% FLUSH: 3.0000 mL | INTRAVENOUS | Status: DC | PRN

## 2022-04-14 MED ORDER — HEPARIN SODIUM (PORCINE) 1000 UNIT/ML IJ SOLN
INTRAMUSCULAR | Status: AC
  Filled 2022-04-14: qty 10

## 2022-04-14 MED ORDER — MIDAZOLAM HCL 2 MG/2ML IJ SOLN
INTRAMUSCULAR | Status: AC
  Filled 2022-04-14: qty 2

## 2022-04-14 MED ORDER — ASPIRIN 81 MG PO CHEW
81.0000 mg | CHEWABLE_TABLET | Freq: Once | ORAL | Status: AC
  Administered 2022-04-14: 81 mg via ORAL
  Filled 2022-04-14: qty 1

## 2022-04-14 MED ORDER — HEPARIN (PORCINE) IN NACL 1000-0.9 UT/500ML-% IV SOLN
INTRAVENOUS | Status: AC
  Filled 2022-04-14: qty 1000

## 2022-04-14 MED ORDER — HEPARIN SODIUM (PORCINE) 1000 UNIT/ML IJ SOLN
INTRAMUSCULAR | Status: DC | PRN
  Administered 2022-04-14: 3500 [IU] via INTRAVENOUS

## 2022-04-14 MED ORDER — VERAPAMIL HCL 2.5 MG/ML IV SOLN
INTRAVENOUS | Status: DC | PRN
  Administered 2022-04-14: 10 mL via INTRA_ARTERIAL

## 2022-04-14 MED ORDER — FENTANYL CITRATE (PF) 100 MCG/2ML IJ SOLN
INTRAMUSCULAR | Status: AC
  Filled 2022-04-14: qty 2

## 2022-04-14 MED ORDER — LIDOCAINE HCL (PF) 1 % IJ SOLN
INTRAMUSCULAR | Status: DC | PRN
  Administered 2022-04-14: 5 mL

## 2022-04-14 MED ORDER — HYDRALAZINE HCL 20 MG/ML IJ SOLN: 10.0000 mg | INTRAMUSCULAR | Status: DC | PRN

## 2022-04-14 MED ORDER — ACETAMINOPHEN 325 MG PO TABS: 650.0000 mg | ORAL_TABLET | ORAL | Status: DC | PRN

## 2022-04-14 MED ORDER — FENTANYL CITRATE (PF) 100 MCG/2ML IJ SOLN
INTRAMUSCULAR | Status: DC | PRN
  Administered 2022-04-14: 25 ug via INTRAVENOUS

## 2022-04-14 MED ORDER — LIDOCAINE HCL (PF) 1 % IJ SOLN
INTRAMUSCULAR | Status: AC
  Filled 2022-04-14: qty 30

## 2022-04-14 MED ORDER — ONDANSETRON HCL 4 MG/2ML IJ SOLN: 4.0000 mg | Freq: Four times a day (QID) | INTRAMUSCULAR | Status: DC | PRN

## 2022-04-14 MED ORDER — VERAPAMIL HCL 2.5 MG/ML IV SOLN
INTRAVENOUS | Status: AC
  Filled 2022-04-14: qty 2

## 2022-04-14 SURGICAL SUPPLY — 12 items
BAND CMPR LRG ZPHR (HEMOSTASIS) ×1
BAND ZEPHYR COMPRESS 30 LONG (HEMOSTASIS) IMPLANT
CATH 5FR JL3.5 JR4 ANG PIG MP (CATHETERS) IMPLANT
CATH BALLN WEDGE 5F 110CM (CATHETERS) IMPLANT
GLIDESHEATH SLEND SS 6F .021 (SHEATH) IMPLANT
GUIDEWIRE .025 260CM (WIRE) IMPLANT
GUIDEWIRE INQWIRE 1.5J.035X260 (WIRE) IMPLANT
INQWIRE 1.5J .035X260CM (WIRE) ×1
KIT HEART LEFT (KITS) ×1 IMPLANT
PACK CARDIAC CATHETERIZATION (CUSTOM PROCEDURE TRAY) ×1 IMPLANT
SHEATH GLIDE SLENDER 4/5FR (SHEATH) IMPLANT
TRANSDUCER W/STOPCOCK (MISCELLANEOUS) ×1 IMPLANT

## 2022-04-14 NOTE — Interval H&P Note (Signed)
History and Physical Interval Note:  04/14/2022 1:10 PM  Kenneth [name]  has presented today for surgery, with the diagnosis of pulmonary hypertension, exertional dyspnea, CAD based on coronary calcium score.  The various methods of treatment have been discussed with the patient and family. After consideration of risks, benefits and other options for treatment, the patient has consented to  Procedure(s): RIGHT/LEFT HEART CATH AND CORONARY ANGIOGRAPHY (N/A) as a surgical intervention.  The patient's history has been reviewed, patient examined, no change in status, stable for surgery.  I have reviewed the patient's chart and labs.  Questions were answered to the patient's satisfaction.     Taydem Cavagnaro [employer]

## 2022-04-14 NOTE — Progress Notes (Signed)
TR BAND REMOVAL  LOCATION:    right radial  DEFLATED PER PROTOCOL:    Yes.    TIME BAND OFF / DRESSING APPLIED:    1545   SITE UPON ARRIVAL:    Level 0  SITE AFTER BAND REMOVAL:    Level 0  CIRCULATION SENSATION AND MOVEMENT:    Within Normal Limits   Yes.    COMMENTS:   no bleeding needed

## 2022-04-14 NOTE — Progress Notes (Signed)
Patient and wife was given discharge instructions. Both verbalized understanding. 

## 2022-04-14 NOTE — Discharge Instructions (Signed)

## 2022-04-15 ENCOUNTER — Encounter (HOSPITAL_COMMUNITY): Payer: Self-pay | Admitting: Cardiology

## 2022-04-15 LAB — POCT I-STAT EG7
Acid-Base Excess: 1 mmol/L (ref 0.0–2.0)
Bicarbonate: 26.3 mmol/L (ref 20.0–28.0)
Calcium, Ion: 1.19 mmol/L (ref 1.15–1.40)
HCT: 33 % — ABNORMAL LOW (ref 39.0–52.0)
Hemoglobin: 11.2 g/dL — ABNORMAL LOW (ref 13.0–17.0)
O2 Saturation: 76 %
Potassium: 4.3 mmol/L (ref 3.5–5.1)
Sodium: 138 mmol/L (ref 135–145)
TCO2: 28 mmol/L (ref 22–32)
pCO2, Ven: 44 mmHg (ref 44–60)
pH, Ven: 7.384 (ref 7.25–7.43)
pO2, Ven: 42 mmHg (ref 32–45)

## 2022-04-17 NOTE — Research (Signed)
IPF-PRO Registry  Purpose: To collect data and biological samples that will support future research studies.  The goal of the registry is to research the current approaches to diagnosis, treatment, and progression of IPF. In addition, the registry will analyze participant characteristics, assess quality of life, describe participants interactions with the health care system, determine IPF treatment practices across multiple institutions, and utilize biological samples to identify disease biomarkers.     Clinical Research Coordinator / Research RN note : This visit for [name] with DOB: 03/22/1941 on 04/07/2022 for the above protocol is Visit/Encounter #60 Month and is for purpose of research.    The consent for this encounter is under Protocol Version Amendment 4 (Version Date: [id number]) and is currently IRB approved.  Subject expressed continued interest and consent in continuing as a study subject. Subject confirmed that there was no change in contact information (e.g. address, telephone, email). Subject thanked for participation in research and contribution to science.    During this visit on 04/07/2022, the subject completed the blood work and questionnaires per the above referenced protocol. Please refer to the subject's paper source binder for further details.   Signed by   Hollace Kinnier Research Assistant  [facility]  [place], [place] 9:54 PM 04/17/2022

## 2022-04-28 NOTE — Progress Notes (Signed)
PCP: [name], MD Pulmonology: Dr. [name] Cardiology: Dr. Aundra Dubin  81 y.o. with history of pulmonary fibrosis, high grade B cell lymphoma, and prostate cancer was referred by Dr. [name] for evaluation of dyspnea and tachycardia.  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  He is on Esbriet.  He has not used home oxygen.  He has baseline exertional dyspnea.  In 5/22, he had XRT for prostate cancer.  In 9/22, he was diagnosed with high grade B cell lymphoma and finished his 6th cycle of mini-CHOP in 4/23.  Pre-op echo in 9/22 showed EF 65-70%, normal RV, unable to estimate PASP, mild AS mean gradient 12 mmHg.  Patient has been noted by CT to have coronary calcification.   After completing CHOP in 4/23, patient developed persistent sinus tachycardia.  He felt palpitations.  D dimer was normal.  No evidence for infection.  Echo in 4/23 showed EF 55-60%, normal RV, unable to assess PA pressure (no TR jet), and nodular calcification of the mitral valve.  Due to mitral valve abnormality, blood cultures were done which were negative.  Sinus tachycardia eventually subsided on its own.   He returns for followup of dyspnea.  Still with dyspnea walking up stairs, no dyspnea on flat ground.  No chest pain.  No orthopnea/PND.  HR now in 80s-90s.  He is exercising again.  He had some swelling in his ankles recently, now resolved.  Lower extremity venous dopplers showed no DVT.   Labs (4/23): D dimer negative, BNP 137, HS-TnI 13, K 4.1, creatinine 0.59, hgb 10.2, WBCs 12 Labs (5/23): K 4.4, creatinine 0.64, hgb 11.3  PMH: 1. IBS 2. 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  - CT chest in 9/22 with pulmonary fibrosis, enlarged PA, coronary artery calcium.  3. CAD: Coronary calcification on 9/22 CT chest.  - Cardiolite in 9/18 showed no ischemia or infarction.  4. Hyperlipidemia 5. High grade B cell lymphoma: Diagnosed in 9/22.  He received 6  cycles of mini-CHOP.  - Echo (9/22): Prechemo, EF 65-70%, normal RV, unable to estimate PASP, mild AS mean gradient 12 mmHg.  - Echo (4/23): EF 55-60%, normal RV, unable to assess PA pressure (no TR jet), and nodular calcification of the mitral valve.  6. Aortic stenosis: Mild on 9/22 echo.  7. 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  8. H/o L-spine compression fracture.   SH: Married, lives in [place], retired Stage manager, nonsmoker, rare ETOH.   Family History  Problem Relation Age of Onset   Heart failure Father 91   Bladder Cancer Mother    Breast cancer Mother    Colon cancer Neg Hx    Pancreatic cancer Neg Hx    Rectal cancer Neg Hx    Stomach cancer Neg Hx    Prostate cancer Neg Hx    ROS: All systems reviewed and negative except as per HPI.   Current Outpatient Medications  Medication Sig Dispense Refill   acetaminophen (TYLENOL) 500 MG tablet Take 1,000 mg by mouth every 6 (six) hours as needed for moderate pain.     acetaminophen-codeine (TYLENOL #3) 300-30 MG per tablet Take 1 tablet by mouth every 8 (eight) hours as needed for moderate pain.     aspirin EC 81 MG tablet Take 81 mg by mouth every Monday, Wednesday, and Friday.     Bempedoic Acid (NEXLETOL) 180 MG TABS Take 180 mg by mouth daily.     bismuth subsalicylate (PEPTO  BISMOL) 262 MG/15ML suspension Take 30 mLs by mouth every 6 (six) hours as needed for diarrhea or loose stools or indigestion.     carboxymethylcellulose (REFRESH TEARS) 0.5 % SOLN Place 1 drop into both eyes daily as needed (dry eyes).     carisoprodol (SOMA) 350 MG tablet Take 175 mg by mouth 3 (three) times daily as needed for muscle spasms.     Cholecalciferol (VITAMIN D-3) 1000 UNITS CAPS Take 2,000 Units by mouth every evening.     CO-ENZYME Q-10 PO Take 1 tablet by mouth every evening.      Cyanocobalamin (VITAMIN B12) 1000 MCG TBCR Take 1,000 mcg by mouth daily.     denosumab (PROLIA) 60 MG/ML SOSY injection Inject 60 mg into the skin every 6  (six) months. At [place]     diclofenac (VOLTAREN) 75 MG EC tablet Take 75 mg by mouth 2 (two) times daily as needed (pain.).  2   diclofenac sodium (VOLTAREN) 1 % GEL Apply 4 g topically 4 (four) times daily as needed (pain.).  12   dicyclomine (BENTYL) 10 MG capsule Take one by mouth every 6 hours as needed for abdominal cramping 30 capsule 1   diphenoxylate-atropine (LOMOTIL) 2.5-0.025 MG tablet Take 1 tablet by mouth 4 (four) times daily as needed for diarrhea or loose stools.     Emollient (AQUAPHOR ADV PROTECT HEALING EX) Apply 1 Application topically daily as needed (itching).     Fluticasone Propionate (XHANCE) 93 MCG/ACT EXHU Place 1 spray into both nostrils 2 (two) times daily.     Glucosamine Sulfate 500 MG TABS Take 500 mg by mouth daily.     HYDROcodone-acetaminophen (NORCO/VICODIN) 5-325 MG tablet Take 1-2 tablets by mouth every 4 (four) hours as needed for moderate pain. (Patient taking differently: Take 1 tablet by mouth every 4 (four) hours as needed for moderate pain.) 24 tablet 0   loratadine (CLARITIN) 10 MG tablet Take 10 mg by mouth daily.     meclizine (ANTIVERT) 25 MG tablet Take 25 mg by mouth 3 (three) times daily as needed for dizziness.      methocarbamol (ROBAXIN) 500 MG tablet Take 500 mg by mouth daily as needed (severe muscle spasms).     Multiple Vitamin (MULTIVITAMIN) tablet Take 1 tablet by mouth daily.     omeprazole (PRILOSEC) 40 MG capsule Take 1 capsule (40 mg total) by mouth daily. (Patient taking differently: Take 40 mg by mouth daily as needed (acid reflux).) 30 capsule 11   ondansetron (ZOFRAN) 8 MG tablet TAKE 1 TABLET (8 MG TOTAL) BY MOUTH EVERY 8 (EIGHT) HOURS AS NEEDED FOR NAUSEA OR VOMITING. START 72 HOURS AFTER IV CHEMOTHERAPY ADMINISTRATION 30 tablet 1   oxybutynin (DITROPAN) 5 MG tablet Take 10 mg by mouth at bedtime.     Pirfenidone (ESBRIET) 801 MG TABS Take 1 tablet by mouth 3 (three) times daily with meals. 270 tablet 1   Probiotic  Product (ALIGN) 4 MG CAPS Take 4 mg by mouth daily.     prochlorperazine (COMPAZINE) 10 MG tablet Take 1 tablet (10 mg total) by mouth every 6 (six) hours as needed for nausea. 60 tablet 1   No current facility-administered medications for this visit.   There were no vitals taken for this visit. General: NAD Neck: No JVD, no thyromegaly or thyroid nodule.  Lungs: Clear to auscultation bilaterally with normal respiratory effort. CV: Nondisplaced PMI.  Heart regular S1/S2, no S3/S4, 2/6 early SEM RUSB.  No peripheral edema.  No carotid bruit.  Normal pedal pulses.  Abdomen: Soft, nontender, no hepatosplenomegaly, no distention.  Skin: Intact without lesions or rashes.  Neurologic: Alert and oriented x 3.  Psych: Normal affect. Extremities: No clubbing or cyanosis.  HEENT: Normal.   Assessment/Plan: 1. Sinus tachycardia: Resolved.  TSH, D dimer normal.  Echo in 4/23 showed EF 55-60%, normal RV, unable to assess PA pressure (no TR jet), and nodular calcification of the mitral valve.  Suspect this may have been due to the cumulative effect of chemotherapy and steroids.   2. Exertional dyspnea: Suspect this is due to IPF.  Echo in 4/23 showed EF 55-60%, normal RV, unable to assess PA pressure (no TR jet), and nodular calcification of the mitral valve. He is not volume overloaded on exam.  - With enlarged PA suggestive of pulmonary hypertension in the setting of IPF, I think RHC to assess for PH-ILD (and also to assess filling pressures with dyspnea) would be reasonable.  Unable to assess PA pressure by echo with no TR jet measured.  With PH-ILD, patient would likely be a candidate for Tyvaso.  He is going to see Dr. [name] and will then decide whether he wants to do [place].  3. IPF: Long-standing, has been on Esbriet.  4. CAD: Coronary calcification noted.   - Continue ASA 81.  - Continue Zetia  Followup 1 year.  He or Dr. [name] will contact me if they want RHC done.   [place] 04/28/2022

## 2022-04-29 ENCOUNTER — Ambulatory Visit (HOSPITAL_COMMUNITY)
Admission: RE | Admit: 2022-04-29 | Discharge: 2022-04-29 | Disposition: A | Discharge status: Home / Self Care | Payer: PPO | Source: Ambulatory Visit | Attending: Family Medicine | Admitting: Family Medicine

## 2022-04-29 ENCOUNTER — Encounter (HOSPITAL_COMMUNITY): Payer: Self-pay

## 2022-04-29 VITALS — BP 118/60 | HR 91 | Wt 168.4 lb

## 2022-04-29 DIAGNOSIS — R000 Tachycardia, unspecified: Secondary | ICD-10-CM

## 2022-04-29 DIAGNOSIS — D649 Anemia, unspecified: Secondary | ICD-10-CM | POA: Diagnosis not present

## 2022-04-29 DIAGNOSIS — Z79899 Other long term (current) drug therapy: Secondary | ICD-10-CM | POA: Diagnosis not present

## 2022-04-29 DIAGNOSIS — R0602 Shortness of breath: Secondary | ICD-10-CM | POA: Insufficient documentation

## 2022-04-29 DIAGNOSIS — R0600 Dyspnea, unspecified: Secondary | ICD-10-CM | POA: Diagnosis not present

## 2022-04-29 DIAGNOSIS — J84112 Idiopathic pulmonary fibrosis: Secondary | ICD-10-CM | POA: Diagnosis not present

## 2022-04-29 DIAGNOSIS — I2510 Atherosclerotic heart disease of native coronary artery without angina pectoris: Secondary | ICD-10-CM | POA: Diagnosis not present

## 2022-04-29 DIAGNOSIS — R5383 Other fatigue: Secondary | ICD-10-CM | POA: Insufficient documentation

## 2022-04-29 DIAGNOSIS — Z7982 Long term (current) use of aspirin: Secondary | ICD-10-CM | POA: Diagnosis not present

## 2022-04-29 NOTE — Patient Instructions (Signed)
Great to see you today! You are scheduled to be called to make a follow-up 1 year appt with Dr. Aundra Dubin in April 2024.   If you do not hear from our clinic in Jan/Feb please call to schedule this appt.  At the [facility], you and your health needs are our priority. We have a designated team specialized in the treatment of Heart Failure. This Care Team includes your primary Heart Failure Specialized Cardiologist (physician), Advanced Practice Providers (APPs- Physician Assistants and Nurse Practitioners), and Pharmacist who all work together to provide you with the care you need, when you need it.   You may see any of the following providers on your designated Care Team at your next follow up:  Dr. Glori Bickers Dr. Loralie Champagne Dr. Roxana Hires, NP Lyda Jester, [place] [facility] [place], [place] Forestine Na, NP Audry Riles, PharmD   Please be sure to bring in all your medications bottles to every appointment.   Need to Contact us:  If you have any questions or concerns before your next appointment please send [place] a message through [place] or call our office at [phone].    TO LEAVE A MESSAGE FOR THE NURSE SELECT OPTION 2, PLEASE LEAVE A MESSAGE INCLUDING: YOUR NAME DATE OF BIRTH CALL BACK NUMBER REASON FOR CALL**this is important as we prioritize the call backs  YOU WILL RECEIVE A CALL BACK THE SAME DAY AS LONG AS YOU CALL BEFORE 4:00 PM

## 2022-05-13 ENCOUNTER — Other Ambulatory Visit: Payer: Self-pay | Admitting: Neurosurgery

## 2022-05-13 DIAGNOSIS — M8448XA Pathological fracture, other site, initial encounter for fracture: Secondary | ICD-10-CM

## 2022-05-20 ENCOUNTER — Ambulatory Visit
Admission: RE | Admit: 2022-05-20 | Discharge: 2022-05-20 | Disposition: A | Discharge status: Home / Self Care | Payer: PPO | Source: Ambulatory Visit | Attending: Neurosurgery | Admitting: Neurosurgery

## 2022-05-20 ENCOUNTER — Other Ambulatory Visit: Payer: Self-pay | Admitting: Neurosurgery

## 2022-05-20 DIAGNOSIS — M533 Sacrococcygeal disorders, not elsewhere classified: Secondary | ICD-10-CM | POA: Diagnosis not present

## 2022-05-20 DIAGNOSIS — M8448XA Pathological fracture, other site, initial encounter for fracture: Secondary | ICD-10-CM

## 2022-05-20 MED ORDER — METHYLPREDNISOLONE ACETATE 40 MG/ML INJ SUSP (RADIOLOG
80.0000 mg | Freq: Once | INTRAMUSCULAR | Status: AC
  Administered 2022-05-20: 80 mg via INTRA_ARTICULAR

## 2022-05-20 MED ORDER — IOPAMIDOL (ISOVUE-M 200) INJECTION 41%
1.0000 mL | Freq: Once | INTRAMUSCULAR | Status: AC
  Administered 2022-05-20: 1 mL via INTRA_ARTICULAR

## 2022-06-04 ENCOUNTER — Inpatient Hospital Stay (HOSPITAL_BASED_OUTPATIENT_CLINIC_OR_DEPARTMENT_OTHER): Payer: PPO | Admitting: Oncology

## 2022-06-04 ENCOUNTER — Inpatient Hospital Stay: Payer: PPO | Attending: Oncology

## 2022-06-04 VITALS — BP 113/60 | HR 88 | Temp 98.1°F | Resp 18 | Ht 69.0 in | Wt 167.6 lb

## 2022-06-04 DIAGNOSIS — K648 Other hemorrhoids: Secondary | ICD-10-CM | POA: Insufficient documentation

## 2022-06-04 DIAGNOSIS — R110 Nausea: Secondary | ICD-10-CM | POA: Diagnosis not present

## 2022-06-04 DIAGNOSIS — C61 Malignant neoplasm of prostate: Secondary | ICD-10-CM | POA: Diagnosis not present

## 2022-06-04 DIAGNOSIS — K589 Irritable bowel syndrome without diarrhea: Secondary | ICD-10-CM | POA: Insufficient documentation

## 2022-06-04 DIAGNOSIS — D649 Anemia, unspecified: Secondary | ICD-10-CM | POA: Diagnosis not present

## 2022-06-04 DIAGNOSIS — C8331 Diffuse large B-cell lymphoma, lymph nodes of head, face, and neck: Secondary | ICD-10-CM | POA: Diagnosis not present

## 2022-06-04 DIAGNOSIS — R351 Nocturia: Secondary | ICD-10-CM | POA: Insufficient documentation

## 2022-06-04 DIAGNOSIS — J8410 Pulmonary fibrosis, unspecified: Secondary | ICD-10-CM | POA: Insufficient documentation

## 2022-06-04 DIAGNOSIS — C8330 Diffuse large B-cell lymphoma, unspecified site: Secondary | ICD-10-CM

## 2022-06-04 LAB — CBC WITH DIFFERENTIAL (CANCER CENTER ONLY)
Abs Immature Granulocytes: 0.01 10*3/uL (ref 0.00–0.07)
Basophils Absolute: 0 10*3/uL (ref 0.0–0.1)
Basophils Relative: 0 %
Eosinophils Absolute: 0.4 10*3/uL (ref 0.0–0.5)
Eosinophils Relative: 10 %
HCT: 32.1 % — ABNORMAL LOW (ref 39.0–52.0)
Hemoglobin: 11.4 g/dL — ABNORMAL LOW (ref 13.0–17.0)
Immature Granulocytes: 0 %
Lymphocytes Relative: 17 %
Lymphs Abs: 0.8 10*3/uL (ref 0.7–4.0)
MCH: 32.9 pg (ref 26.0–34.0)
MCHC: 35.5 g/dL (ref 30.0–36.0)
MCV: 92.5 fL (ref 80.0–100.0)
Monocytes Absolute: 0.6 10*3/uL (ref 0.1–1.0)
Monocytes Relative: 13 %
Neutro Abs: 2.7 10*3/uL (ref 1.7–7.7)
Neutrophils Relative %: 60 %
Platelet Count: 200 10*3/uL (ref 150–400)
RBC: 3.47 MIL/uL — ABNORMAL LOW (ref 4.22–5.81)
RDW: 12.9 % (ref 11.5–15.5)
WBC Count: 4.5 10*3/uL (ref 4.0–10.5)
nRBC: 0 % (ref 0.0–0.2)

## 2022-06-04 LAB — CMP (CANCER CENTER ONLY)
ALT: 11 U/L (ref 0–44)
AST: 19 U/L (ref 15–41)
Albumin: 4.4 g/dL (ref 3.5–5.0)
Alkaline Phosphatase: 30 U/L — ABNORMAL LOW (ref 38–126)
Anion gap: 5 (ref 5–15)
BUN: 15 mg/dL (ref 8–23)
CO2: 30 mmol/L (ref 22–32)
Calcium: 8.7 mg/dL — ABNORMAL LOW (ref 8.9–10.3)
Chloride: 100 mmol/L (ref 98–111)
Creatinine: 0.77 mg/dL (ref 0.61–1.24)
GFR, Estimated: >60 mL/min (ref >60)
Glucose, Bld: 145 mg/dL — ABNORMAL HIGH (ref 70–99)
Potassium: 4.6 mmol/L (ref 3.5–5.1)
Sodium: 135 mmol/L (ref 135–145)
Total Bilirubin: 0.5 mg/dL (ref 0.3–1.2)
Total Protein: 6.9 g/dL (ref 6.5–8.1)

## 2022-06-04 LAB — LACTATE DEHYDROGENASE: LDH: 230 U/L — ABNORMAL HIGH (ref 98–192)

## 2022-06-04 NOTE — Progress Notes (Signed)
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  He recently started an antidepressant.  He underwent right and left heart catheterization procedures on 04/14/2022.  He has a 40% mid LAD lesion.  Normal cardiac output. He continues to walk for exercise.  He has nocturia.  No fever or night sweats.  No palpable lymph nodes.  He has nausea.  He relates this to his medication for pulmonary fibrosis.  He underwent facet injection treatment 05/20/2022 for sacral pain.  Objective:  Vital signs in last 24 hours:  Blood pressure 113/60, pulse 88, temperature 98.1 F (36.7 C), temperature source Oral, resp. rate 18, height _0  (1.753 m), weight 167 lb 9.6 oz (76 kg), SpO2 98 %.     Lymphatics: No cervical, supraclavicular, axillary, or inguinal nodes Resp: Inspiratory rales at the right greater than left lower posterior chest, no respiratory distress Cardio: Regular rate and rhythm GI: No hepatosplenomegaly Vascular: No leg edema      Lab Results:  Lab Results  Component Value Date   WBC 4.5 06/04/2022   HGB 11.4 (L) 06/04/2022   HCT 32.1 (L) 06/04/2022   MCV 92.5 06/04/2022   PLT 200 06/04/2022   NEUTROABS 2.7 06/04/2022    CMP  Lab Results  Component Value Date   NA 135 06/04/2022   K 4.6 06/04/2022   CL 100 06/04/2022   CO2 30 06/04/2022   GLUCOSE 145 (H) 06/04/2022   BUN 15 06/04/2022   CREATININE 0.77 06/04/2022   CALCIUM 8.7 (L) 06/04/2022   PROT 6.9 06/04/2022   ALBUMIN 4.4 06/04/2022   AST 19 06/04/2022   ALT 11 06/04/2022   ALKPHOS 30 (L) 06/04/2022   BILITOT 0.5 06/04/2022   GFRNONAA >60 06/04/2022   GFRAA >60 06/01/2017    Medications: I have reviewed the patient's current medications.   Assessment/Plan: Non-Hodgkin's lymphoma CTs neck and chest 03/19/2021-cluster of left supraclavicular nodes measuring less than 1 cm, 12 mm right node next to the jugular  vein, chest with slight interval progression of pulmonary fibrosis, new small sclerotic lesion in the right third rib concerning for metastasis, lucent lesion in T8 corresponding to abnormal uptake on a bone scan 05/21/2020 suspicious for malignancy, subpectoral nodes-subcentimeter in size but larger than 03/28/2019 FNA of left neck node 03/25/2021-atypical lymphoid proliferation Excisional biopsy of level 5 left neck node 04/07/2021-lymph node with marked autolysis and diffusely infiltrated by atypical lymphocytes, most consistent with a CD10 positive B-cell lymphoma, flow cytometry confirmed a CD10 positive B-cell population, kappa light chain restricted Review of [facility] pathology by Dr. 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 Cycle 1 R-mini CHOP 07/10/2021 Cycle 2 R-mini CHOP 07/31/2021 Cycle 3 R-mini CHOP 08/21/2021 Cycle 4 R-mini CHOP 09/11/2021 PET 09/29/2021-near complete resolution of hypermetabolic lymph nodes, no remaining  hypermetabolic disease, wall thickening and hypermetabolic activity at the transverse and sigmoid colon Cycle 5 R-mini CHOP  10/02/2021 Cycle 6 R-mini CHOP 10/23/2021, Udenyca 10/24/2021 Prostate cancer-T3 adenocarcinoma, Gleason 5+4, external beam XRT 09/12/2020 - 11/07/2020, maintained on androgen deprivation therapy 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  He has chronic mild anemia, likely related to chronic disease and treatment of lymphoma and prostate cancer.  He will return for an office and lab visit in 4 months.  He will call in the interim for new symptoms.  [name], MD  06/04/2022  9:19 AM

## 2022-06-18 ENCOUNTER — Telehealth: Payer: Self-pay | Admitting: Pharmacist

## 2022-06-18 NOTE — Telephone Encounter (Signed)
Submitted a Prior Authorization RENEWAL request to  [employer]  for PIRFENIDONE via CoverMyMeds. Will update once we receive a response.  Key: [id number]  Per automated response: Prior Authorization duplicate/approved  Knox Saliva, PharmD, MPH, BCPS, CPP Clinical Pharmacist (Rheumatology and Pulmonology)

## 2022-06-23 ENCOUNTER — Encounter: Payer: Self-pay | Admitting: Internal Medicine

## 2022-06-23 ENCOUNTER — Other Ambulatory Visit: Payer: Self-pay | Admitting: Pharmacist

## 2022-06-23 ENCOUNTER — Ambulatory Visit (INDEPENDENT_AMBULATORY_CARE_PROVIDER_SITE_OTHER): Payer: PPO | Admitting: Internal Medicine

## 2022-06-23 VITALS — BP 122/74 | HR 87 | Ht 69.0 in | Wt 171.4 lb

## 2022-06-23 DIAGNOSIS — J84112 Idiopathic pulmonary fibrosis: Secondary | ICD-10-CM

## 2022-06-23 DIAGNOSIS — R5383 Other fatigue: Secondary | ICD-10-CM | POA: Diagnosis not present

## 2022-06-23 DIAGNOSIS — Z5181 Encounter for therapeutic drug level monitoring: Secondary | ICD-10-CM | POA: Diagnosis not present

## 2022-06-23 MED ORDER — PIRFENIDONE 801 MG PO TABS: 1.0000 | ORAL_TABLET | Freq: Three times a day (TID) | ORAL | 1 refills | Status: DC

## 2022-06-23 NOTE — Progress Notes (Signed)
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 He tells me that this summer 2018*noticing insidious onset of shortness of breath particularly with walking stairs in the house or [place]. This was not that in the previous years. The summer 2018 he was in [place] but did not notice much shortness of breath. Otherwise he is able to do his activities of daily living and played golf with a car. He is more bothered by his back pain and sciatica as a result of previous back surgery. Shortness of breath is extremely mild. He says he became more aware of it after the radiologic investigations documented below. He had a chest x-ray that suggested interstitial findings and therefore he underwent a high-resolution CT scan of the chest that is described below. I personally visualized this high resolution CT chest and to me it shows bilateral bibasal subpleural reticulation that also extends to the upper lobes without any zonal predominance. There is no obvious honeycombing but there is traction bronchiectasis. There no mediastinal adenopathy. Therefore he is been referred here. In terms of exposure history other than radiation exposure he has not been on any pulmonary toxic drugs or any mold exposure or asbestos exposure. Does have GERD and is on PPI and is well ocntrolled   Does have spring and perennial allergies - sneezes easy. Allergy test with [facility] negative some years ago  [employer] chest physicians interstitial lung disease questionnaire: He says that he does not cough. He is only trouble with dyspnea with strenuous exercise and it started 4 months ago. Past medical history significant for acid reflux. He has never smoked any tobacco or street drugs. Does not have any family history of lung disease.  At home he does not have any humidifier sound birds heart the water damage or mold. 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 He has standard radiation exposure while at work. The standard radiation for a radiologist. He does not take any pulmonary toxic drugs. House is 80 years old and he worries about mold. He sneezes if air is forced   Pulmonary function test shows isolated reduction in diffusion capacity to 62%. Walking desaturation test underneath her feet 3 laps on room air: Resting pulse ox 96%. Final pulse ox 95%. Heart rate resting was 92/m and went up to 109 minute.   Results for ZADE, FALKNER (MRN [id number]) as of 03/23/2017 10:04  Ref. Range 03/04/2017 09:13  FVC-Pre Latest Units: L 3.59  FVC-%Pred-Pre Latest Units: % 86  Results for PARVIN, STETZER (MRN [id number]) as of 03/23/2017 10:04  Ref. Range 03/04/2017 09:13  DLCO cor Latest Units: ml/min/mmHg 20.06  DLCO cor % pred Latest Units: % 63    Lungs/Pleura: There is subpleural reticulation with scattered ground-glass and traction bronchiolectasis, without a definite zonal predominance. No definitive honeycombing. Findings appear mildly progressive when radiographs dating back to 12/28/2008 are reviewed. Probable 5 mm subpleural lymph node along the right major fissure. No pleural fluid. Airway is unremarkable.      IMPRESSION: 1. Pulmonary parenchymal findings of interstitial lung disease which may be due to fibrotic nonspecific interstitial pneumonitis or, given slight progression over time, usual interstitial pneumonitis. 2. Aortic atherosclerosis (ICD10-170.0). Coronary artery calcification. 3. Aortic aneurysm NOS (  ICD10-I71.9). 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 Recommend annual imaging followup by CTA or MRA. This recommendation follows 2010 ACCF/AHA/AATS/ACR/ASA/SCA/SCAI/SIR/STS/SVM Guidelines for the Diagnosis and Management of Patients with Thoracic  Aortic Disease. Circulation. 2010; 121: L976-B341. 4. Prominence of the right and left main pulmonary arteries can be seen with pulmonary arterial hypertension.     Electronically Signed   By: [name]   On: 02/17/2017 13:41      OV 03/23/2017  Chief Complaint  Patient presents with   Follow-up    SOB on exertion. Other than that he has been doing about the same. Denies any cough or CP.   Here to discuss results. [name]. 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 In inteirm no new symptoms.  Autoimmune profile negative. They raise possibility of 2nd opinion.  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 Progression is based on CXR comparison since 2010; there is no CT and is only suspicion of progression  Results for BOYKIN, BAETZ (MRN [id number]) as of 03/23/2017 10:04  Ref. Range 12/08/2011 16:44 03/04/2017 13:47  Anit Nuclear Antibody(ANA) Latest Ref Range: NEGATIVE   NEG  Angiotensin-Converting Enzyme Latest Ref Range: 9 - 67 U/L  31  Cyclic Citrullin Peptide Ab Latest Units: Units  <16  ds DNA Ab Latest Units: IU/mL  <1  ENA RNP Ab Latest Ref Range: 0.0 - 0.9 AI  0.2  Endomysial Screen Latest Ref Range: NEGATIVE  NEGATIVE   Deamidated Gliadin Abs, IgG Latest Ref Range: <20 U/mL 4.1   Gliadin IgA Latest Ref Range: <20 U/mL 1.8   RA Latex Turbid. Latest Ref Range: <14 IU/mL  <14  Tissue Transglut Ab Latest Ref Range: <20 U/mL 3.9   Tissue Transglutaminase Ab, IgA Latest Ref Range: <20 U/mL 2.2   IgE (Immunoglobulin E), Serum Latest Ref Range: <115 kU/L  31  IgA Latest Ref Range: 68 - 379 mg/dL 132   SSA (Ro) (ENA) Antibody, IgG Latest Ref Range: <1.0 NEG AI   <1.0 NEG  SSB (La) (ENA) Antibody, IgG Latest Ref Range: <1.0 NEG AI   <1.0 NEG    OV 05/06/2017  Chief Complaint  Patient presents with   Follow-up    Follow up today after beginning Esbriet. Pt has no c/o SOB,  cough, or CP. Has had some mild nausea from the [place] but other than that, he has been doing good on it.   ollow-up idiopathic pulmonary fibrosis mild severity  He is here to follow-up He is on the third week of his bed at 3 pills 3 times a day max dose. So far is tolerating it fine other than mild nausea. He does take after meals. He plans to meet with [facility] coordinator for medication support. He did visit [place] this past weekend and did have vomiting especially after consuming few etoh drinks at a [employer]. He does apply sunscreen [his daughter is a Paediatric nurse in [place]. He still complains about the fact that and is 79 year old home when the air conditioner comes on and they air blows out of the duct system he does sneeze 10 times and his feet does feel cold. She plans to put hepa filters that are high and and also get the duct cleaning. He says he did have allergy evaluation and found few years ago and it was negative. Recently he has increased his rpinrole and alsogot an  injection for his back and he does feel better   OV 06/22/2017  Chief Complaint  Patient presents with   Follow-up    Pt still taking Esbriet and has been doing good except has been having issues with nausea x3 days. PFT done today. Still becomes SOB with exertion. Denies any complaints of cough or CP.   IPF followup  Routine followup. Now on esbriet 3 pills tid since end oct 2018. Having new nausea - moderate, x 3 days. Might be chills. But no associated diarrhea or other side effects. Spacing esbriet 4h only. Also has complaitns about high co pay. Unable to find charity. Doing exrcise with trainer - feeels that is better and more vigorous than rehab. Gets tachy with eercise but says he does not desaturate. Had PFT - stable. Lot of questions about disease    Walking desaturation test on 06/22/2017 185 feet x 3 laps on ROOM AIR:  did NOT desaturate. Rest pulse ox was 100%, final pulse ox was 99%. HR  response 84/min at rest to 97/min at peak exertion. Patient VEER ELAMIN  Did not Desaturate < 88% . Kenneth [name] did not  Desaturated </= 3% points. Marylyn Ishihara A Kozakiewicz yes did get tachyardic   OV 09/20/2017  Chief Complaint  Patient presents with   Follow-up    PFT done today.  Pt states he has been doing good. Pt still taking Esbriet and states he is doing good on it.   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  Joen Laura for IPF follow-up.  Last visit December 2018.  Since then he has been compliant fully with his Pirfenidone (Esbriet).  His nausea resolved with Ginger intake.  However he is having 3-5 pound weight loss and also fatigue towards the end of the day and also low appetite.  He is exercising vigorously 5 times a week and he is wondering if the fatigue could be because of the heavy exercise.  He is not interested in lung transplantation.  He is open to research participation in interventional trial.  Currently he is on the IPF registry program.  Overall he is well.  He did go to [place] to visit his son and he did not wear sunscreen and he did pick up a stage I skin burn with erythema that was more than usual.  But this resolved.  I did remind him about his obligation to wear sunscreen at all times with Pirfenidone (Esbriet).  He is interested in interventional research trials.  His pulmonary function test to me on an average is stable even though there is decline in FVC that seems to be an increase in DLCO so that is variability.  His walking desaturation test is around the same. He is interested in rolling over to the larger capsule of the Pirfenidone (Esbriet) so we will have to take it only 3 times a day. ESberit is cposting him $500/month or more due to high AGI and this is frustrating for him   Walking desaturation test on 09/20/2017 185 feet x 3 laps on ROOM AIR:  did walk briskly all 3 laps with only mild dyspnea desaturate. Rest pulse ox was 98%, final pulse ox was 98%. HR response 96/min at rest to  107/min at peak exertion. Patient ZAKARIYA KNICKERBOCKER  Did not Desaturate < 88% . Kenneth [name] did not  Desaturated </= 3% points. Guillermina [place] Kilner yes did get tachyardic this appears similar to a few months ago   OV 12/07/2017  Chief Complaint  Patient presents with   Follow-up    Pt had pre Arlyce Harman and DLCO prior to OV today. Pt has increase of SOB with exertion more with walking up the hill.   Dr. Annamaria Boots presents for follow-up of his IPF to ILD clinic.  In terms of his IPF he feels stable.  In fact he tells me that if not for the incidental chest x-ray and a subsequent CT scan that picked up pulmonary fibrosis he even to this day will not know that he has IPF.  He only has a mild exertional dyspnea class I which she always believes in the past that it was because of aging.  At this point in time he is on Esbriet 1 big pill 3 times daily at full dose.  He is now applying sunscreen and has not had any further sunburns.  He is following an extremely intense exercise fitness regimen to improve his cardiac conditioning.  He believes his exertional heart rate and resting heart rate are much improved than before.  He works with a Clinical research associate at [employer].  His main issue is from Pirfenidone [facility]).  He had some mild nausea that has now resolved with ginger capsules but he continues to have some amount of fatigue low appetite and some mild weight loss.  He says this is mild and all tolerable.  The fatigue happens after working a lot in the yard and he loves yard work.  He wants to know if he can continue to do yard work.  Recently he did have some sinus congestion and took some steroids and antibiotics for it and he feels better but he still has some sinus fullness.  He had spirometry and simple walking desaturation test and the profile is below and I believe these are stable.  In fact his exertional heart rate is improved compared to previous.  Today he also participated in the IPF registry program.  There is a  noninterventional trial.  He is interested in future interventional trials.   OV 03/08/2018  Chief Complaint  Patient presents with   Follow-up    PFT performed today. Pt states he has been doing well except he has been having problems with nausea and dizziness, and loss of appetite.   Kenneth [name] - presents for follow-up. For his IPF. He is on esbruet now. He was tolerating his Pirfenidone (Esbriet) quite well other than minor side effects till last visit. However in thesummer of 2019e's had increased nauseaand also weight loss. He has lost 6 pounds since prior visit. Overall he is lost 11 pounds since September 2018. We started the medication esbeit  anti-fibrotic for him in October 2018. His pants are getting looser.he is also having significant fatigue. Nevertheless he does not want to switch to the of anti-fibrotic ofev because of prior history of irritable bowel syndrome. He says that his baseline irritable bowel syndrome is worse than any side effects he is having with esbriet especially the fatigue.e is open to a transplant evaluation and consideration of research protocols. His spirometry shows stability with FVC but his DLCO might be declining.Marland Kitchen His simple walk test did not show anychange. He is not noticing any subjective changes in dyspnea on effort tolerance while working out although he does admit to increased dizziness particularly when playing golf. During this time recently has not monitored his pulse ox but in the past and never went below 92%.  Other issues: He had some myalgia despite change in his statin. He  will address this with his primary care. 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 Primary care has ordered a follow-up CT scan.This can be combined with a high risk CT scan. I I have reached out to  the thoracic radiology     OV 05/10/2018  Subjective:  Patient ID: Kenneth [name], male , DOB: [birth year] , age 42 y.o. , MRN: [id number] , ADDRESS: [address] [place] [id number]   05/10/2018 -   Chief Complaint  Patient presents with   Follow-up    review spiro with dlco.  c/o stable doe.  On 860m Esbriet TID, notes occ dizziness, loss of appetite.      HPI KDAREL RICKETTS89y.o. -returns-year follow-up.  Diagnosis made towards the end of 2018.  He has been on Esbriet since then.  His main issues have been weight loss.  He lost 10 pounds of weight at the time of last visit.  At the last visit he weighed 174 pounds in September 2019.  Back in August 2018 he weighed 186 pounds.  All this weight loss happen after he started Pirfenidone (Esbriet).  However he is now stabilized and he continues to be 874 pounds.  He is able to do activities of daily living and exercise but when he does stairs he gets dyspneic.  Overall he feels stable in the last 1 year in terms of his effort tolerance.  His main thing is that he has no appetite.  He has been communicating with the Pirfenidone (Esbriet) supporting about how to manage his low appetite and occasional nausea.  We discussed the options about taking brief holidays or reduction in dose.  He is willing to try this.  He does take occasional ginger.  Of note he had a transplant [facility] because he was out of network.  He tells me that his insurance company only advised that he would have a higher co-pay so is a little bit puzzled by this.  Since then he said conversation with his primary care physician and he wants to see Dr. DShana Chutewho is a son-in-law to his friend Dr. BLindwood Quaretired sPsychologist, sport and exercise  I know Dr. GLauris Chromanquite well from an IPF standpoint.  And therefore wholeheartedly supported the idea.  In terms of his lung function testing his FVC shows a decline although he is feeling the same.  On miniature walking test he seems a little more tachycardic but he says he feels the same.  He manages this heart rate with his exercise.  His  DLCO with some variability appears stable.  He had a CT angiogram for thoracic aortic aneurysm in September 2019 and this when compared to one year earlier has been reported as stable but the different resolutions.  I offered to do a high-resolution CT chest here but he wants to hold off until he saw Dr. GLauris Chroman  OV 08/23/2018  Subjective:  Patient ID: KWestley [name] male , DOB: 4February 18, 1942, age 81y.o. , MRN: [id number], [place] [id number]  08/23/2018 -   Chief Complaint  Patient presents with   Follow-up    PFT performed today. Pt states he has been doing well since last visit and denies any complaints.   IPF followup"   Diagnosis made towards the end of 2018.  He has been on Esbriet since then.   HPI KMarylyn Ishihara  A Syler 81 y.o. -Dr. Ronny Flurry presents for follow-up of his IPF.  He continues to feel good.  His main issue is tolerating the Esbriet which gives some fatigue.  Sometimes he has to postpone it.  In terms of his dyspnea he continues to exercise.  His dyspnea symptom score is really minimal.  His cough is nonexistent.  He was losing weight with Esbriet but now he is gained his weight back.  In the interim he did see Dr. Shana Chute June 23, 2018 at [facility].  He had a satisfactory visit.  He thought he was stable.  His next visit with Dr. Lauris Chroman will be in June 2020.  Moving forward he wants to alternate every 6 months between myself and Dr. Lauris Chroman at [facility] which will give him 3 months with 1 pulmonologist or the other.  He is beginning to get interested in research studies.  We did pulmonary function test on him today and is documented below.  The FVC appears a fluctuant but the DLCO definitely appears on a downward trend.  The new DLCO is on a global lung initiated [GL I] equation and therefore the percent predicted is higher.  But the absolute value showed downward trend line.  We went over this.  He definitely does not feel this subjectively.  His  last high-resolution CT scan of the chest was in August of 2018.  He is willing to have one at follow-up.  He wants to see me again in 6 months.  His simple walking desaturation test appears stable.  He is thinking of making a trip to [place] and wants to know if he should wear a mask.    OV 04/21/2019  Subjective:  Patient ID: Kenneth [name], male , DOB: Nov 14, 1940 , age 64 y.o. , MRN: [id number] , ADDRESS: [place] [id number]   04/21/2019 -   Chief Complaint  Patient presents with   Follow-up    Patient reports that his breathing is doing well at this time.    IPF followup. On esbriet  HPI DORTHY HUSTEAD 81 y.o. -presents for follow-up of his idiopathic pulmonary fibrosis.  Last visit was pre-pandemic in February 2018.  In the interim he did see Dr. Shana Chute at [facility] and deemed to be stable.  At this point in time he is reporting continued stability with the symptoms as seen by the score below.  However I did notice that he is lost lot of weight.  His BMI with his clothes on is 25 with a weight of 170 pounds.  He says his dry weight is actually around 162 pounds.  He feels that this is the stable weight in the last 6 months.  He says he is not bothered by it.  He says this is because of low appetite because of the and pirfenidone.  He feels the pirfenidone is benefiting him and he continues to be is to be stable.  In fact his pulmonary function test shows " improvement".  He said that he felt the test today was somewhat variable and is effort based on the technician.  He had a recent high-resolution CT chest that shows continued stability.  I personally visualized this film.  At this point in time he is happy taking the pirfenidone.  But he did agree if there is further weight loss into the 150s pounds then we could reassess  Had a lot of discussion about COVID-19 and about appropriate risk reduction measures  which he is continuing to do.  He did some read some  literature about COVID-19 and had questions about it. He in Vit D supplementation and is agreeable to get his levels checked following literature about protective effect of Vit D  Other than that he reports 2 episodes of dizziness and in one of those he nearly passed out.  He was wondering if some of the symptoms were related to ropinirole and therefore he has stopped it.  I reviewed the literature with him and the significant amount of dizziness and hypotension.  And also GI symptoms which can probably accentuated with pirfenidone.   OV 10/16/2019  Subjective:  Patient ID: Kenneth [name], male , DOB: 1940/11/15 , age 26 y.o. , MRN: [id number] , ADDRESS: [place] [id number]   10/16/2019 -   Chief Complaint  Patient presents with   Follow-up    PFT performed 3/31.  Pt states he is doing okay. States his breathing is about the same since last visit.     HPI NEZIAH BRALEY 81 y.o.  -follow-up IPF on pirfenidone    -ROS   67-monthfollow-up for Dr. YAnnamaria Boots  He continues on pirfenidone.  He is taking full dose.  In the interim he has had hernia surgery 6 weeks ago for right inguinal hernia.  This is gone well.  He is change his exercise from bicycle to walking because of the hernia.  His symptom score is the same.  [facility] 2021 [facility].  Deemed to be stable.  I reviewed that note.  After that he has had pulmonary function test with uKoreacouple weeks ago.  This shows a decline.  He says the quality of instructions was poor and therefore the decline is because of that.  At least that is his suspicion.  He feels well.  Walking desaturation test is stable.  He has gained some weight.  He continues to have some nausea and fatigue and diarrhea with the pirfenidone.  But this is stable and manageable.  He feels his irritable bowel syndrome is active because of the pirfenidone.  He plans to see Dr. JJoanell Risingfor that.  His last liver  function test was with Dr. GLauris Chromanin January 2021.  This was normal per the note.  His last CT scan was towards the end of 2020 and deemed to be stable.     OV 04/11/2020   Subjective:  Patient ID: KWestley [name] male , DOB: 406-22-1942 age 81y.o. years. , MRN: [id number]  [place] [address] [id number]  PCrist Infante MD Providers : Dr GAlisia Ferrari Treatment Team:  Attending Provider: RBrand Males MD   Chief Complaint  Patient presents with   Follow-up    PFT performed today.  Pt states he has been doing well since last visit and denies any complaints.    Follow-up idiopathic pulmonary fibrosis.  Diagnosis towards the end of 2018.  On pirfenidone.  Last CT scan of the chest September 2020.   HPI KJAYCEN VERCHER89y.o. -presents for follow-up last seen in April 2021.  After that in July 2021 [facility]  I reviewed the notes.  The feeling was that he was stable.  He is only minimally symptomatic.  He had a 6-minute walk test and did really well without any significant desaturations.  He is now  resorted to high intensity interval training based on advice from a physical therapist/gym instructor.  He feels like stronger and failure.  He also states that his appetite is better and is eating better.  He feels his weight is stable.  In terms of his IPF symptoms is minimal and documented below.  He just has mild irritable bowel syndrome with his Esbriet and baseline health.  His walking desaturation test today in the office is stable.  His pulmonary function test was reviewed.  Over the last 3 years it shows a slight steady decline.  This decline is both in FVC and DLCO.  He is averaging around 2.6% decline per year with FVC while 8% decline in the DLCO per year.  On an average with a 3% decline per year in the last 3 years.  We did discuss this.  We discussed #pulmonary fibrosis foundation support group -he will visit the  website.  He gets newsletters from them.  At this point in time he is not interested in joining the support group but he did get the contact information for this and will reach out if he desires so.  #-We also discussed research as a care option -we discussed trial sponsored by [place] [facility] of pirfenidone.  The trial is called [employer].  The investigation medical product is called [id number]-[id number] in the axilla macrophage pathway.  Promising phase 2 data.  Research team gave him the consent form under my delegation.  He understands research is voluntary and is to evaluate unproven therapies.  He understands that the primary intent is to contribute to her scientific development and therapeutic gain is secondary because of an approved product the risks benefit ratio is very different compared to blood products.  He is going to read the consent and think about potential participation.  I sent him via email medical journal articles about the investigational product    He tells me that he feels his weight is stable.  His dry weight early in the morning is between 162-163 pounds.  I pointed out that his weight today is 168 pounds and is lower than before.  However he says that the difference rating 168-172 is related to clothing and his weight at home is stable.  He is not concerned about the slight weight loss.  Overall while he agrees he is lost weight since starting Esbriet he feels recently has been stable.  He wants to continue to monitor the situation  Irritable bowel: He said he will check with Dr. Henrene Pastor about this  Other issues:  - He wants me to check his iron panel -He will do follow-up of his IPF-registry visit today  -for research-  -He has had his Covid booster    OV 10/22/2020  Subjective:  Patient ID: Kenneth [name], male , DOB: 05-10-1941 , age 82 y.o. , MRN: [id number] , ADDRESS: [place] [address] PCP [name], MD Patient Care Team: [name], MD as  PCP - General (Internal Medicine) Cira Rue, RN as Oncology Nurse Navigator  This Provider for this visit: Treatment Team:  Attending Provider: Brand Males, MD    10/22/2020 -   Chief Complaint  Patient presents with   Follow-up    Fatigue from radiation treatments, more winded and more GI upset from radiation treatments    Follow-up idiopathic pulmonary fibrosis.  Diagnosis towards the end of 2018.  On pirfenidone.  Last CT scan of the chest September 2020.    HPI  Kenneth [name] 81 y.o. -returns for follow-up.  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  He is finished hormonal treatment and now is on radiation treatment.  Radiation treatment is aggravating his GI side effects from Pirfenidone (Esbriet) and from irritable bowel syndrome.  Is also causing fatigue.  He went August a masters in walk the tournament.  This made him more fatigued than what he experienced few years ago.  He said it took him a few days to recover.  He used to have weight loss but this is now stabilized it is documented below.  His symptom severity score and his walking desaturation test are stable but his pulmonary function test continues to show decline.  His DLCO shows a relatively more decline but he said there were technique issues today.  He is got upcoming appointment with ILD clinic at [facility] in July.  At this point in time he is interested in phase 3 clinical trials.  We have quite a few to off as of summer 2022 but his prostate cancer could be an exclusion.  We will have the research team evaluate.  He is saddened by the loss of his friend Dr. March Rummage.  He is evaluating whether he should join the pulmonary fibrosis foundation at the national level to be a board member.     OV 04/03/2021  Subjective:  Patient ID: Kenneth [name], male , DOB: [birth year] , age 60 y.o. , MRN: [id number] , ADDRESS: [place] [address] PCP [name], MD Patient Care Team: [name], MD as PCP - General (Internal Medicine) Cira Rue, RN as Oncology Nurse Navigator  This Provider for this visit: Treatment Team:  Attending Provider: Brand Males, MD    04/03/2021 -   Chief Complaint  Patient presents with   Follow-up    Just performed a partial PFT, patient states increased SOB.    Follow-up idiopathic pulmonary fibrosis.  Diagnosis towards the end of 2018.  On pirfenidone.  Last CT scan of the chest September 2020. - > sept 2022  Underlying irritable bowel syndrome  Initial weight loss with pirfenidone and then stabilized  HPI Kenneth [name] 81 y.o. -Dr. Annamaria Boots presents for his IPF follow-up.  Since I last saw him he has had several health issues.  He got diagnosed with prostate cancer and then had radiation to that.  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  He is going to have a colonoscopy.  He feels his irritable bowel syndrome pirfenidone and ultimately the radiation made his GI symptoms significantly worse.  In order to combat the fatigue he has been undergoing physical therapy.  He also try to get back to golf and took a golf swing for practice and then states that he had a compression fracture in the spine.  This is probably also because of years of steroid injections in his back.  In addition when he had a high-resolution CT chest in September 2022 this month there was some neck nodes and he has had a neck CT scan followed by ENT visit to [name].  Apparently fine-needle aspiration was nondiagnostic.  He is scheduled to undergo excision biopsy.  The combination of all this is gotten a little bit more anxious and depressed.  As you can see his symptom scores for depression and anxiety are up.  He is also feeling a little more short of breath than usual.  He says his pulse ox is in the low 90s when he exerts himself but it still adequate.  Review of his symptoms and objective basis shows his symptom score  to be the same.  His walking desaturation test is also the same.  His weight is stable.  His pulmonary function test shows stability in diffusion but a decline in FVC compared to earlier this year.  Had a high-resolution CT chest that only shows minimal progression fibrosis over 2 years.  Echocardiogram September 2022 did not show any evidence of pulmonary hypertension     CLINICAL DATA:  Pulmonary fibrosis.   EXAM: CT CHEST WITHOUT CONTRAST   TECHNIQUE: Multidetector CT imaging of the chest was performed following the standard protocol without intravenous contrast. High resolution imaging of the lungs, as well as inspiratory and expiratory imaging, was performed.   COMPARISON:  03/28/2019, 03/30/2018 and 02/27/2017. Bone scan 05/21/2020.   FINDINGS: Cardiovascular: Atherosclerotic calcification of the aorta, aortic valve and coronary arteries. Enlarged pulmonic trunk and right and left main pulmonary arteries. Heart is at the upper limits of normal in size. No pericardial effusion.   Mediastinum/Nodes: Low left internal jugular lymph nodes measure up to 8 mm (2/8), previously 3 mm on 03/28/2019. Mediastinal lymph nodes are subcentimeter in short axis size, as before. Hilar regions are difficult to definitively evaluate without IV contrast. Left axillary lymph nodes measure up to 9 mm, are unchanged and not considered enlarged by CT size criteria. High left subpectoral lymph node measures 7 mm (2/11), increased from 4 mm on 03/28/2019. No right axillary adenopathy. Esophagus is grossly unremarkable. Periesophageal lymph nodes are subcentimeter in short axis size and appears similar.   Lungs/Pleura: Peripheral and basilar predominant subpleural reticulation, ground-glass and traction bronchiectasis/bronchiolectasis, minimally progressive from 03/28/2019. Evidence of progression is best seen in the lower lung zones. No pleural fluid. Airway is unremarkable. No air trapping.    Upper Abdomen: Visualized portions of the liver, gallbladder, adrenal glands, kidneys, spleen, pancreas, stomach and bowel are unremarkable.   Musculoskeletal: Degenerative changes in the spine. There is a new small rounded sclerotic lesion in the lateral aspect of the right third rib (sagittal image 32). New lucent lesion in the left posterolateral aspect of the T8 vertebral body measuring approximately 1.3 x 1.7 x 2.1 cm (2/84 and sagittal image 96). This likely corresponds to abnormal uptake seen on bone scan 05/21/2020. Finding is new from 03/28/2019. Slight compression of the T12 superior endplate is new.   IMPRESSION: 1. Slight interval progression of pulmonary fibrosis. Findings are consistent with UIP per consensus guidelines: Diagnosis of Idiopathic Pulmonary Fibrosis: An Official ATS/ERS/JRS/ALAT Clinical Practice Guideline. Laurie, Iss 5, [phone], Mar 06 2017. 2. New small sclerotic lesion in the lateral aspect of the right third rib, worrisome for metastatic prostate cancer. 3. Lucent lesion in the left posterolateral aspect of the T8 vertebral body corresponds to abnormal uptake on bone scan 05/21/2020, and is new from CT chest 03/28/2019, raising suspicion for malignancy. 4. Left internal and high left subpectoral lymph nodes are subcentimeter in size but appear slightly larger than on 03/28/2019. Difficult to exclude a lymphoproliferative disorder. Please refer to CT neck with contrast done the same day in further evaluation. 5. Slight compression of the T12 superior endplate is new but age indeterminate. 6. Aortic atherosclerosis (ICD10-I70.0). Coronary artery calcification. 7. Enlarged pulmonary arteries, indicative of pulmonary arterial hypertension.     Electronically Signed   By: [name]  On: 03/19/2021 15:12   Xxxxx  IMPRESSION: Neck CT September 2022 Mildly prominent lymph nodes in the neck bilaterally left  greater than right. Cluster of left supraclavicular and left posterior lymph nodes all under 1 cm.   Largest lymph node in the neck is a right level 3 lymph node measuring 12 mm on axial image 75.     Electronically Signed   By: Franchot Gallo M.D.   On: 03/19/2021 15:24   Xxxx IMPRESSIONS echocardiogram September 2022    1. Left ventricular ejection fraction, by estimation, is 65 to 70%. The  left ventricle has normal function. The left ventricle has no regional  wall motion abnormalities. Left ventricular diastolic parameters are  consistent with Grade I diastolic  dysfunction (impaired relaxation).   2. Right ventricular systolic function is normal. The right ventricular  size is normal. Tricuspid regurgitation signal is inadequate for assessing  PA pressure.   3. Left atrial size was mildly dilated.   4. The mitral valve is abnormal. Trivial mitral valve regurgitation. No  evidence of mitral stenosis.   5. The aortic valve is tricuspid. There is mild calcification of the  aortic valve. There is moderate thickening of the aortic valve. Aortic  valve regurgitation is not visualized. Mild aortic valve stenosis. Aortic  valve area, by VTI measures 1.62 cm.  Aortic valve mean gradient measures 12.0 mmHg. Aortic valve Vmax measures  2.31 m/s. DI is 0.43.   6. Aortic dilatation noted. There is borderline dilatation of the aortic  root, measuring 39 mm. There is borderline dilatation of the ascending  aorta, measuring 39 mm.   7. The inferior vena cava is normal in size with <50% respiratory  variability, suggesting right atrial pressure of 8 mmHg.   Comparison(s): Changes from prior study are noted. 03/02/2017: LVEF 60-65%,  mild LVH, grade 1 DD, aortic root 39 mm, no aortic stenosis.   OV 06/17/2021  Subjective:  Patient ID: Kenneth [name], male , DOB: 19-Nov-1940 , age 30 y.o. 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     HPI MARRELL DICAPRIO 81 y.o. 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  Since his last visit from ILD standpoint he continues to be stable.  His walking desaturation test is stable.  However he is reporting more shortness of breath but this is directly related to more fatigue.  Review of the labs indicate in October 2022 he had mild anemia and also mild hyponatremia.  In the past radiation therapy for prostate made him extremely tired.  He is also on pirfenidone that can cause tiredness.  He now has lymphoma also that can cause tiredness.  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  He seen Dr. Kavin Leech.  They are planning to start R-CHOP treatment after Christmas.  He is frustrated by the recurrence of various medical illnesses.  The second cancer since his IPF diagnosis.  In terms of his IPF: He is now on generic pirfenidone.  He feels the nausea is worse.  He wants to switch to branded Esbriet.  Gave him donor sample of Esbriet by [employer].  Lot number [id number] with expiration June 2025 [unopened bottle], lot number [id number] with expiration 05/2023 [unopened bottle and also lot number [id number] with expiration 12/2023 [partial bottle].  He is asked me to send a message to the pharmacist to make the switch.  He says even if it is more expensive he will play for the branded Esbriet because it is better quality of life.  He is worried that nausea and other side effects will get worse  particularly once he starts chemotherapy.   No results found.   OV 09/18/2021  Subjective:  Patient ID: Kenneth [name], male , DOB: 09/10/1940 , age 52 y.o. , MRN: [id number] , ADDRESS: [place] [address] PCP [name], MD Patient Care Team: [name], MD as PCP - General (Internal Medicine) Cira Rue, RN as Oncology Nurse Navigator O'Kelley, Mindi Slicker, RN as Oncology Nurse Navigator  This Provider for this visit: Treatment Team:  Attending Provider: Brand Males, MD  Follow-up idiopathic pulmonary fibrosis.  Diagnosis towards the end of 2018.  On pirfenidone.  Last CT scan of the chest September 2020. - > sept 2022  Underlying irritable bowel syndrome  Initial weight loss with pirfenidone and then stabilized  History of prostate cancer  New diagnosis of B-cell lymphoma cervical lymphadenopathy -late 2022  Multifactorial fatigue  - mild anemia OCt 2022  - mild hypontermia OCt 2022  09/18/2021 -   Chief Complaint  Patient presents with   Follow-up    PFT performed today.  Pt states he is about the same since last visit. States that he is currently undergoing chemotherapy due to diagnosis of lymphoma.     HPI VICENT FEBLES 81 y.o. -returns for follow-up.  He is taking Esbriet instead of generic pirfenidone.  He is also taking 3 little pills 3 times daily.  He finds that Esbriet to be better than generic pirfenidone.  He finds 3 little pills better than 1 whole pill in terms of nausea.  Still is dealing with a lot of nausea both from the chemo and Esbriet.  He says yesterday was really bad day with nausea.  He does take Zofran but we discussed and said he will try taking the Zofran 30 to 60 minutes before the Esbriet.  Nevertheless he has been able to gain some weight.  He is actually feeling slightly less short of breath.  He is able to do more particularly after his back pain has resolved.  Nevertheless he is on antiprostate cancer therapy.  He  is also undergoing chemotherapy from his B-cell lymphoma.  His mood is better.  He says he is socially isolating significantly because of his pulm fibrosis and cancer history.  He wanted to know if he could go out.  He did go to [employer] for show but wore a mask.  I did indicate to him that he could follow the CDC map to see prevalence of respiratory viruses and when the problem is overall low he could take more risk.  Also like being outdoors is less risky.  If he is endorsing particularly a cluster putting a mask on reduces his risk.  But I did say that he could use these guardrails and be more active so he can get some socialization and help his mood.  Can also give him purpose.  He is now sold his house and is planning to move into [facility].  In terms of his walking desaturation test and symptoms he stable.  In terms of his pulmonary function test his DLCO is stable but his FVC is down.  He is feeling similar.  Most of the decline seems to be from several years ago but most recently appears to be stable overall. We did discuss clinical trials but cancer generally is an exclusion but otherwise he would be interested.  We did discuss potential for doing inhaled treprostinil trial.  We did discuss the fact the neck step and standard of care would be oxygen if he were to decline but so far he has held good.  He recently lost his friend [facility] who also had pulmonary fibrosis.     He has upcoming [employer] appointment but he missed the 1 earlier in the year because of all ischemia.  He is thinking about holding off on going to [facility] and just following here.  I was fine with this.   OV 10/28/2021  Subjective:  Patient ID: Kenneth [name], male , DOB: 1941/06/02 , age 49 y.o. , MRN: [id number] , ADDRESS: [place] [address] PCP [name], MD Patient Care Team: [name], MD as PCP - General (Internal Medicine) Cira Rue, RN as Oncology Nurse  Navigator O'Kelley, Mindi Slicker, RN as Oncology Nurse Navigator  This Provider for this visit: Treatment Team:  Attending Provider: Brand Males, MD    10/28/2021 -   Chief Complaint  Patient presents with   Acute Visit    Pt states since his last chemo treatment, he has been having problems with his breathing and also states that his pulse has been elevated.     Kenneth [name] 81 y.o. -presents with his wife.  Status meeting his wife for the first time but she reminded me that she had made him early on in the course of his illness.  He is finished cycles of R-CHOP treatment for his lymphoma.  On 10/23/2021 Thursday he saw a nurse practitioner in oncology.  At that point it was noticed that he had finished mini CHOP treatment 5 rounds 10/02/2021.  He did complete his 6 cycle.  He was feeling well.  His resting heart rate was 82.  Pulse ox at rest was 100%.  His white count was observed to be slightly low.  They discussed several options and decided to proceed with chemotherapy followed by [facility] single dose on 10/24/2021 Friday.  He was doing fairly okay then on Sunday, 10/26/2021 he noticed resting tachycardia heart rate 125 but no fever.  At no point any fever.  No cough no chills no nausea no vomiting no diarrhea.  The same thing persisted even on Monday, 10/27/2021.  He noticed that if he walked his heart rate was up to 140 he became short of breath and stop.  His pulse ox would drop to 90% but no abnormal desaturations.  He started getting worried.  He went to [employer] yesterday 10/27/2021.  EKG reviewed sinus tachycardia with some ectopic atrial beats.  Nothing acute.    Labs: Found to have mild hyponatremia.  Also found to have mild anemia white count in response to Udenyca went from low to  high.No D-dimer.  No troponin no BNP checked.  Last echo and CT scan of the chest in September 2022.  Walking desaturation test is definitely  more tachycardic than baseline.  Associated with this he  is also got exaggerated pulse ox drop although still adequate for simple walking test.  His pace is slower.   We repeated an EKG here: Personally visualized.  Sinus tachycardia heart rate 120 approximately.  Nothing acute.      OV 01/09/2022  Subjective:  Patient ID: Kenneth [name], male , DOB: [birth year] , age 25 y.o. , MRN: [id number] , ADDRESS: [address] [place] [facility] [address] PCP [name], MD Patient Care Team: [name], MD as PCP - General (Internal Medicine) Cira Rue, RN as Oncology Nurse Navigator O'Kelley, Mindi Slicker, RN as Oncology Nurse Navigator  This Provider for this visit: Treatment Team:  Attending Provider: Brand Males, MD    01/09/2022 -   Chief Complaint  Patient presents with   Follow-up    PFT performed today. Pt states that he feels like he may be a little worse since last visit. States that he has had some problems with congestion which he wonders could be due to the weather.   HPI TRINTON PREWITT 81 y.o. 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  At this point in time he is finished his chemotherapy.  In April 2023 he did have some tachycardic response following the last chemo.  Cardiac work-up ended up being fine.  He says overall he is been feeling fine.  He is now relocated to a retirement home in [place].  [place]  During this time his pulse ox immediately after exertion was always in the 90s.  He did not feel that his IPF was getting worse when he was there.  He just came back yesterday.  However for the last few days he is having increased fatigue nasal congestion.  He also feels his left ear is blocked.  He tried to do pulmonary function test today.  He felt he did not give a good effort particularly the DLCO.  Consistent with this his numbers are much worse.  His FVC shows a 7% decline compared to March 2023 and DLCO 14% decline compared to March 2023.  His symptom score seems similar  to September 2022.  His walking desaturation test is also stable on this level ground.  His weight loss is stabilized since he is at 168 pounds.  He is frustrated by this.  Is also frustrated by his multiple health issues.  The pirfenidone he is taking the generic version.  He is tolerating it but is requiring Zofran which then causes constipation.  Emotionally he is looking for a break from all his health issues  He plans [place] today.     01/26/2022: Today - follow up Patient presents today for follow up after being treated for sinusitis. He reports feeling better but still has issues with nasal congestion and pressure in his left ear. They did improve with prednisone taper. These are problems he has been dealing with for a while now. He has tried multiple different nasal sprays with some relief. 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 He is sure this was the result of his worsening pulmonary function testing. He is working on getting in to see [name] with ENT. Otherwise, no concerns today. Breathing is overall stable. He gets winded with stair climbing but otherwise, no issues. Doesn't feel like his activity is limited by his breathing. He's planning to go  back [place] the mountains in the next few weeks.     OV 04/07/2022  Subjective:  Patient ID: Kenneth [name], male , DOB: 1940/09/07 , age 41 y.o. , MRN: [id number] , ADDRESS: [address] [place] [facility] [address] PCP [name], MD Patient Care Team: [name], MD as PCP - General (Internal Medicine) Cira Rue, RN as Oncology Nurse Navigator O'[place]elley, Mindi Slicker, RN as Oncology Nurse Navigator  This Provider for this visit: Treatment Team:  Attending Provider: Brand Males, MD   04/07/2022 -   Chief Complaint  Patient presents with   Follow-up    Spiro/DLCO done today. Breathing has been gradually worse since the last visit. He has had some muscle fatigue with exertion. He has some occ  cough- non prod.      HPI HILMAN KISSLING 81 y.o. -returns for follow-up.  He feels he is slowly progressing in terms of his IPF.  In fact his shortness of breath scores are slightly worse.  Last time his pulmonary function test showed a decline he thought was from sinusitis.  Sinusitis is cleared up but he says today when he did his PFTs he felt that he was declining.  He did have some cough he is having some new cough for the last few weeks.  He is worried this might be from worsening IPF but he typically gets cough this time of the year from fall allergy season.  Pulmonary function test shows an improvement compared to the most recent one but overall compared to earlier this year there is a decline.  I shared these results with him.  He is more worried about his fatigue.  However he did play some golf.  He says that he is significantly fatigued.  He has been going on for 6 to 12 months initially thought was related to cancer and chemotherapy but he says it is the all the time.  The fatigue persist despite anemia correction despite his chemotherapy completing is still got fatigue.  He feels it happens episodically but then it is there all over the body and it exhausting.  He believes his primary care is checked his vitamin D levels because he is on replacement.  He believes his TSH and hemoglobin A1c to be normal.  Apparently primary care physician has asked him to consider getting a CT coronaries.  He has seen Dr. Loralie Champagne in the past.  At the time it was from acute chemotherapy consideration.  Dr. Aundra Dubin at the time got an echocardiogram.  He does have enlarged pulmonary artery on the CT scan and he does have coronary artery calcification.  Back then in the spring during chemo side effects patient had deferred right heart catheterization.  This time of reach out to Dr. Aundra Dubin   He is due for an IPF-Pro registry visit today    OV 06/23/2022  Subjective:  Patient ID: Kenneth [name], male , DOB:  01/04/1941 , age 56 y.o. , MRN: [id number] , ADDRESS: [address] [place] [facility] [id number] PCP [name], MD Patient Care Team: [name], MD as PCP - General (Internal Medicine) Cira Rue, RN as Oncology Nurse Navigator O'[place]el[place], Mindi Slicker, RN as Oncology Nurse Navigator  This Provider for this visit: Treatment Team:  Attending Provider: Brand Males, MD   Follow-up idiopathic pulmonary fibrosis.  Diagnosis towards the end of 2018.  On pirfenidone.  Last CT scan of the chest September 2020. 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  Depression and started on Pristiq by primary care physician in 2023  06/23/2022 -   Chief Complaint  Patient presents with   Follow-up    Pt states he has been doing okay since last visit.     HPI JAVARUS DORNER 81 y.o. 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  Overall he is in a better state of health.  His fatigue is improved.  His shortness of breath is also improved somewhat.  He continues pirfenidone at full dose.  His last chemotherapy was in J May 2023.  His last androgen deprivation treatment was in July 2023.  He believes the chemo and the androgen deprivation was causing his fatigue.  With the passage of time he is better.  He is now living in [place].  He is doing daily exercise.  He says that he can go well on a flat ground but when he does uphill sometimes he has to stop but most of the time he is doing well.  He does workout with exercise bike.  He states his tachycardia has improved with more more  physical conditioning.  However he does get fatigued and short of breath when he tries to play golf.  His GI issues in combination of irritable bowel syndrome and pirfenidone continue but it is mild level.  He says 2023 has been a rough year and has been emotional.  Therefore primary care physician started him on Pristiq on the advice of his daughter.  He says since then he somewhat better.  He did have sinusitis a few weeks ago and now he is better.  Take prednisone and cephalosporin.  Social note: We talked about masking particularly with the surgery for respiratory viruses.  On July 25, 2022 he is going to get the Rosalie Doctor award for coverage at [facility].  He is going to talk about pulmonary fibrosis.  I encouraged him to be an advocate for pulmonary fibrosis.  He said in the past is The diagnosis private.  I did tell him that given the serious nature of the disease and rare disease I would encourage him to be as open as he wants to so there is more awareness and advocacy for the disease.  I also advised him to mask for this gathering to the extent possible as a risk reduction against respiratory viruses.     SYMPTOM SCALE - ILD 08/23/2018  04/21/2019  10/16/2019  04/11/2020 168# 10/22/2020 170# - esbriet and XRT for prostate 04/03/2021 168# 06/17/2021 168# lymphoma 09/18/2021 172# 01/09/2022 168# 04/07/2022 173# 06/23/2022 171#   O2 use ra e ra   _0  ra  Shortness of Breath 0 -> 5 scale with 5 being worst (score 6 If unable to do)            At rest 0 0 0 0 0 0 0 0 0 0 0  Simple tasks - showers, clothes change, eating, shaving *0** 0 0 0 0 0 0 0 0 1 0  Household (dishes,  doing bed, laundry) 0 0 0 0 0 1 1 0 _0 Shopping 0 0 0 0 0 0 1 0 _1 Walking level at own pace 0 0 0 0 0 0 1 0 _2 Walking up Stairs _3 Total (40 - 48) Dyspnea Score _4 How bad is your cough? 0 0 0 0 0 0 1 2 0 1 0  How bad is your fatigue 1 due  to esbriet 0 2 0 2 1 3._5 nausea   1 0 _6 0 _7 vomit   0 0 0 0 0 0 0 0 0  diarrhea   _8 0 _9 anxiety   0 0 0 _10 0 0  depresso0   0 0 0 _11 0 1        Simple office walk 185 feet x  3 laps goal with forehead probe 12/07/2017 Weight 180# 03/08/2018 Weight 174.9 05/10/2018 Weight 174# 08/23/2018 Weight 178# 04/21/2019 Weight 170# 10/16/2019 173# 04/11/2020 168# 10/22/2020 170# 04/03/2021 168# 06/17/2021 168# 09/18/2021  10/28/2021  01/09/2022 168# 06/23/2022 171#  O2 used _12  Room air   ra ra ra  ra ra  Number laps completed _13 Comments about pace Moderate pace Mod pace Mod fast pace fast  fast   fast   Avg pace - slower than before    Resting Pulse Ox/HR 100% and 83/min 100% ad 85/min 100% and 91/min 100% and 82/min  100% asnd 91 100% and 83/min 98% and 94/min 100% and 82 100% and 93 100% and 78 99% and H 126 99% and HR 88 100% ahd HR 87  Final Pulse Ox/HR 99% and 98/min 98$ and 104/min 98% and 108/min 98% and 100/min  96% and 106/mom 98% and 96/mn 95% and 110.min 97% and 105 97% ad 109 97% and 94 92% and HR 143 97% and HR 111 96% and HR 110  Desaturated </= 88% no no no no  no          Desaturated <= 3% points no no ni no  yesm 4 points  Yes, 3 points Yes, 3 poitn Yes 3 pomnts Yes 3 Yes, 7 points  Yes 4 ponts  Got Tachycardic >/= 90/min yes yes yes yes  yes  _14     Symptoms at end of test No complaints No complaints  No complaints  Mild dyspnea  no Mild dyspnea Mild dyspnea Mild dysnea Modeate dyspnea  No complaints  Miscellaneous comments x ? sligh increase in tachy/stable ? incraeased tachty       Stable since April 2021         PFT     Latest Ref Rng & Units 04/07/2022    1:46 PM 01/09/2022    9:17 AM 09/18/2021    8:52 AM 04/03/2021    9:21 AM 10/21/2020    8:56 AM 04/11/2020   10:04 AM 10/04/2019    9:32 AM  PFT Results  FVC-Pre L 2.53  P 2.49  2.68  2.96  3.20  3.26  3.35    FVC-Predicted Pre %  68  P 64  68  75  81  82  83   Pre FEV1/FVC % % 86  P 87  84  81  81  80  82   FEV1-Pre L 2.18  P 2.16  2.25  2.38  2.59  2.62  2.74   FEV1-Predicted Pre % 83  P 78  80  85  91  92  95   DLCO uncorrected ml/min/mmHg 12.36  P 12.55  13.07  16.15  13.64  17.26  16.69   DLCO UNC% % 53  P 53  54  67  56  72  69   DLCO corrected ml/min/mmHg 13.57  P 12.55  14.64  16.15  13.64  17.26  17.31   DLCO COR %Predicted % 59  P 53  61  67  56  72  71   DLVA Predicted % 89  P 91  105  91  80  94  91     P Preliminary result       has a past medical history of Arthritis, Diverticulosis of colon (without mention of hemorrhage), GERD (gastroesophageal reflux disease), Hiatal hernia, HLD (hyperlipidemia), Internal hemorrhoids without mention of complication, Irritable bowel syndrome, Other specified gastritis without mention of hemorrhage, Prostate cancer ([place]), Pulmonary fibrosis ([place]), and Stress fracture (02/2021).   reports that he has never smoked. He has never used smokeless tobacco.  Past Surgical History:  Procedure Laterality Date   CARPAL TUNNEL RELEASE     left   CATARACT EXTRACTION  06/2011   bilateral   DEEP NECK LYMPH NODE BIOPSY / EXCISION  03/25/2021   INGUINAL HERNIA REPAIR Right 09/05/2019   Procedure: OPEN REPAIR RIGHT INGUINAL HERNIA WITH MESH;  Surgeon: Armandina Gemma, MD;  Location: [facility];  Service: General;  Laterality: Right;   [place]   x 2   LUMBAR LAMINECTOMY/DECOMPRESSION MICRODISCECTOMY Left 07/11/2013   Procedure: LUMBAR ONE TO TWO, LUMBAR TWO TO THREE, LUMBAR THREE TO FOUR LUMBAR LAMINECTOMY/DECOMPRESSION MICRODISCECTOMY 3 LEVELS;  Surgeon: Charlie Pitter, MD;  Location: [facility];  Service: Neurosurgery;  Laterality: Left;   NASAL SINUS SURGERY     RIGHT/LEFT HEART CATH AND CORONARY ANGIOGRAPHY N/A 04/14/2022   Procedure: RIGHT/LEFT HEART CATH AND CORONARY ANGIOGRAPHY;  Surgeon: Larey Dresser, MD;  Location: [facility]  CV LAB;  Service: Cardiovascular;  Laterality: N/A;    No Known Allergies  Immunization History  Administered Date(s) Administered   Fluad Quad(high Dose 65+) 03/18/2020   Influenza Split 10/03/2009, 03/28/2010, 04/14/2011, 03/22/2012, 03/09/2013, 03/28/2014   Influenza, High Dose Seasonal PF 03/19/2016, 03/06/2017, 04/09/2018, 03/07/2019, 04/06/2020, 04/11/2022   Influenza, Quadrivalent, Recombinant, Inj, Pf 03/12/2018, 03/10/2019, 04/06/2020   Influenza,inj,Quad PF,6+ Mos 04/04/2014, 03/26/2015, 04/05/2016   Influenza-Unspecified 04/05/2016, 04/05/2021   PFIZER Comirnaty(Gray Top)Covid-19 Tri-Sucrose Vaccine 10/15/2020   PFIZER(Purple Top)SARS-COV-2 Vaccination 07/20/2019, 08/10/2019, 02/27/2020, 03/23/2021   Pneumococcal Conjugate-13 03/30/2013, 07/31/2013, 04/26/2018   Pneumococcal Polysaccharide-23 10/03/2009, 01/14/2010, 03/31/2018, 02/18/2021   Pneumococcal-Unspecified 04/05/2016   Td 10/20/2016   Tdap 10/03/2009   Zoster Recombinat (Shingrix) 01/03/2017   Zoster, Live 10/03/2009    Family History  Problem Relation Age of Onset   Heart failure Father 86   Bladder Cancer Mother    Breast cancer Mother    Colon cancer Neg Hx    Pancreatic cancer Neg Hx    Rectal cancer Neg Hx    Stomach cancer Neg Hx    Prostate cancer Neg Hx  Current Outpatient Medications:    acetaminophen (TYLENOL) 500 MG tablet, Take 1,000 mg by mouth every 6 (six) hours as needed for moderate pain., Disp: , Rfl:    acetaminophen-codeine (TYLENOL #3) 300-30 MG per tablet, Take 1 tablet by mouth every 8 (eight) hours as needed for moderate pain., Disp: , Rfl:    aspirin EC 81 MG tablet, Take 81 mg by mouth every Monday, Wednesday, and Friday., Disp: , Rfl:    Bempedoic Acid (NEXLETOL) 180 MG TABS, Take 180 mg by mouth daily., Disp: , Rfl:    bismuth subsalicylate (PEPTO BISMOL) 262 MG/15ML suspension, Take 30 mLs by mouth every 6 (six) hours as needed for diarrhea or loose stools or indigestion.,  Disp: , Rfl:    carboxymethylcellulose (REFRESH TEARS) 0.5 % SOLN, Place 1 drop into both eyes daily as needed (dry eyes)., Disp: , Rfl:    carisoprodol (SOMA) 350 MG tablet, Take 175 mg by mouth 3 (three) times daily as needed for muscle spasms., Disp: , Rfl:    Cholecalciferol (VITAMIN D-3) 1000 UNITS CAPS, Take 2,000 Units by mouth every evening., Disp: , Rfl:    CO-ENZYME Q-10 PO, Take 1 tablet by mouth every evening. , Disp: , Rfl:    Cyanocobalamin (VITAMIN B12) 1000 MCG TBCR, Take 1,000 mcg by mouth daily., Disp: , Rfl:    denosumab (PROLIA) 60 MG/ML SOSY injection, Inject 60 mg into the skin every 6 (six) months. At [place], Disp: , Rfl:    desvenlafaxine (PRISTIQ) 50 MG 24 hr tablet, Take 50 mg by mouth daily., Disp: , Rfl:    diclofenac (VOLTAREN) 75 MG EC tablet, Take 75 mg by mouth 2 (two) times daily as needed (pain.)., Disp: , Rfl: 2   diclofenac sodium (VOLTAREN) 1 % GEL, Apply 4 g topically 4 (four) times daily as needed (pain.)., Disp: , Rfl: 12   dicyclomine (BENTYL) 10 MG capsule, Take one by mouth every 6 hours as needed for abdominal cramping, Disp: 30 capsule, Rfl: 1   diphenoxylate-atropine (LOMOTIL) 2.5-0.025 MG tablet, Take 1 tablet by mouth 4 (four) times daily as needed for diarrhea or loose stools., Disp: , Rfl:    Emollient (AQUAPHOR ADV PROTECT HEALING EX), Apply 1 Application topically daily as needed (itching)., Disp: , Rfl:    Fluticasone Propionate (XHANCE) 93 MCG/ACT EXHU, Place 1 spray into both nostrils 2 (two) times daily., Disp: , Rfl:    Glucosamine Sulfate 500 MG TABS, Take 500 mg by mouth daily., Disp: , Rfl:    HYDROcodone-acetaminophen (NORCO/VICODIN) 5-325 MG tablet, Take 1-2 tablets by mouth every 4 (four) hours as needed for moderate pain. (Patient taking differently: Take 1 tablet by mouth every 4 (four) hours as needed for moderate pain.), Disp: 24 tablet, Rfl: 0   loratadine (CLARITIN) 10 MG tablet, Take 10 mg by mouth daily., Disp: ,  Rfl:    meclizine (ANTIVERT) 25 MG tablet, Take 25 mg by mouth 3 (three) times daily as needed for dizziness. , Disp: , Rfl:    methocarbamol (ROBAXIN) 500 MG tablet, Take 500 mg by mouth daily as needed (severe muscle spasms)., Disp: , Rfl:    Multiple Vitamin (MULTIVITAMIN) tablet, Take 1 tablet by mouth daily., Disp: , Rfl:    omeprazole (PRILOSEC) 40 MG capsule, Take 1 capsule (40 mg total) by mouth daily. (Patient taking differently: Take 40 mg by mouth daily as needed (acid reflux).), Disp: 30 capsule, Rfl: 11   ondansetron (ZOFRAN) 8 MG tablet, TAKE 1 TABLET (8  MG TOTAL) BY MOUTH EVERY 8 (EIGHT) HOURS AS NEEDED FOR NAUSEA OR VOMITING. START 72 HOURS AFTER IV CHEMOTHERAPY ADMINISTRATION, Disp: 30 tablet, Rfl: 1   oxybutynin (DITROPAN) 5 MG tablet, Take 10 mg by mouth at bedtime., Disp: , Rfl:    Probiotic Product (ALIGN) 4 MG CAPS, Take 4 mg by mouth daily., Disp: , Rfl:    prochlorperazine (COMPAZINE) 10 MG tablet, Take 1 tablet (10 mg total) by mouth every 6 (six) hours as needed for nausea., Disp: 60 tablet, Rfl: 1   Pirfenidone (ESBRIET) 801 MG TABS, Take 1 tablet by mouth 3 (three) times daily with meals., Disp: 270 tablet, Rfl: 1      Objective:   Vitals:   06/23/22 0956  BP: 122/74  Pulse: 87  SpO2: 100%  Weight: 171 lb 6.4 oz (77.7 kg)  Height: 5' 9" (1.753 m)    Estimated body mass index is 25.31 kg/m as calculated from the following:   Height as of this encounter: 5' 9" (1.753 m).   Weight as of this encounter: 171 lb 6.4 oz (77.7 kg).  _0 @  Filed Weights   06/23/22 0956  Weight: 171 lb 6.4 oz (77.7 kg)     Physical Exam    General: No distress. Look swell Neuro: Alert and Oriented x 3. GCS 15. Speech normal Psych: Pleasant Resp:  Barrel Chest - no.  Wheeze - no, Crackles -  YES BASE, No overt respiratory distress CVS: Normal heart sounds. Murmurs - no Ext: Stigmata of Connective Tissue Disease - no HEENT: Normal upper airway. PEERL +. No post  nasal drip        Assessment:       ICD-10-CM   1. 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HCC)  G14.159 Pulmonary function test    2. Encounter for therapeutic drug monitoring  Z51.81     3. Other fatigue  R53.83          Plan:     Patient Instructions  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[place]) Encounter for therapeutic drug monitoring Nausea due to drug  -  IPF slowly getting worse but recently stable - IPF PRO last registry visit Oct 2023 - LFT normal 06/04/22  Plan  - continue pirfenidone - monitor pulse ox even with exertion - goal >= 88% - IPF PRO registry visit 04/07/2022 - next spirometry and dlco in 3 months  -nedt LFT check in 3 months   Fatigue - significant  in 2023   - improved with distance from chemo and passage of time and exercise  - glad heart cath normal  Plan -monitor  Weight loss  STabilized since sept 2022 .   Plan  - monitor  Followup - 3 months - 30 min visit with Dr [name] but after spiromety and dlco  - symptom score and walk test at followup    SIGNATURE    Dr. Brand Males, M.D., F.C.C.P,  Pulmonary and Critical Care Medicine Staff Physician, [place] Director - Interstitial Lung Disease  Program  [facility] [place] [facility] [place], [place], [address]  Pager: [phone], If no answer or between  15:00h - 7:00h: call [phone] Telephone: [phone]  4:43 PM 06/23/2022

## 2022-06-23 NOTE — Patient Instructions (Addendum)
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[place]) Encounter for therapeutic drug monitoring Nausea due to drug  -  IPF slowly getting worse but recently stable - IPF PRO last registry visit Oct 2023 - LFT normal 06/04/22  Plan  - continue pirfenidone - monitor pulse ox even with exertion - goal >= 88% - IPF PRO registry visit 04/07/2022 - next spirometry and dlco in 3 months  -nedt LFT check in 3 months   Fatigue - significant  in 2023   - improved with distance from chemo and passage of time and exercise  - glad heart cath normal  Plan -monitor  Weight loss  STabilized since sept 2022 .   Plan  - monitor  Followup - 3 months - 30 min visit with Dr [name] but after spiromety and dlco  - symptom score and walk test at followup

## 2022-06-23 NOTE — Telephone Encounter (Signed)
Refill sent for PIRFENIDONE to [facility] (pulmonary fibrosis team). [phone]  Dose: 801 mg three times daily  Last OV: 06/23/22 Provider: Dr. [name]  Next OV: 3 months   LFTs on 06/04/22 wnl  Knox Saliva, PharmD, MPH, BCPS Clinical Pharmacist (Rheumatology and Pulmonology)

## 2022-06-30 DIAGNOSIS — Z961 Presence of intraocular lens: Secondary | ICD-10-CM | POA: Diagnosis not present

## 2022-06-30 DIAGNOSIS — H52203 Unspecified astigmatism, bilateral: Secondary | ICD-10-CM | POA: Diagnosis not present

## 2022-06-30 DIAGNOSIS — H401131 Primary open-angle glaucoma, bilateral, mild stage: Secondary | ICD-10-CM | POA: Diagnosis not present

## 2022-06-30 DIAGNOSIS — H524 Presbyopia: Secondary | ICD-10-CM | POA: Diagnosis not present

## 2022-06-30 DIAGNOSIS — H5213 Myopia, bilateral: Secondary | ICD-10-CM | POA: Diagnosis not present

## 2022-07-13 DIAGNOSIS — Z85828 Personal history of other malignant neoplasm of skin: Secondary | ICD-10-CM | POA: Diagnosis not present

## 2022-07-13 DIAGNOSIS — L570 Actinic keratosis: Secondary | ICD-10-CM | POA: Diagnosis not present

## 2022-07-20 DIAGNOSIS — M5416 Radiculopathy, lumbar region: Secondary | ICD-10-CM | POA: Diagnosis not present

## 2022-07-20 DIAGNOSIS — M546 Pain in thoracic spine: Secondary | ICD-10-CM | POA: Diagnosis not present

## 2022-07-20 DIAGNOSIS — M542 Cervicalgia: Secondary | ICD-10-CM | POA: Diagnosis not present

## 2022-07-21 DIAGNOSIS — M5412 Radiculopathy, cervical region: Secondary | ICD-10-CM | POA: Diagnosis not present

## 2022-07-22 ENCOUNTER — Other Ambulatory Visit: Payer: Self-pay | Admitting: Neurosurgery

## 2022-07-22 DIAGNOSIS — M5412 Radiculopathy, cervical region: Secondary | ICD-10-CM

## 2022-07-27 ENCOUNTER — Ambulatory Visit
Admission: RE | Admit: 2022-07-27 | Discharge: 2022-07-27 | Disposition: A | Discharge status: Home / Self Care | Payer: PPO | Source: Ambulatory Visit | Attending: Neurosurgery | Admitting: Neurosurgery

## 2022-07-27 DIAGNOSIS — C61 Malignant neoplasm of prostate: Secondary | ICD-10-CM | POA: Diagnosis not present

## 2022-07-27 DIAGNOSIS — M5412 Radiculopathy, cervical region: Secondary | ICD-10-CM | POA: Diagnosis not present

## 2022-07-27 DIAGNOSIS — M4802 Spinal stenosis, cervical region: Secondary | ICD-10-CM | POA: Diagnosis not present

## 2022-07-29 DIAGNOSIS — M5412 Radiculopathy, cervical region: Secondary | ICD-10-CM | POA: Diagnosis not present

## 2022-08-03 DIAGNOSIS — C61 Malignant neoplasm of prostate: Secondary | ICD-10-CM | POA: Diagnosis not present

## 2022-08-03 DIAGNOSIS — H401121 Primary open-angle glaucoma, left eye, mild stage: Secondary | ICD-10-CM | POA: Diagnosis not present

## 2022-08-03 DIAGNOSIS — H401112 Primary open-angle glaucoma, right eye, moderate stage: Secondary | ICD-10-CM | POA: Diagnosis not present

## 2022-08-03 DIAGNOSIS — Z961 Presence of intraocular lens: Secondary | ICD-10-CM | POA: Diagnosis not present

## 2022-08-03 DIAGNOSIS — R3915 Urgency of urination: Secondary | ICD-10-CM | POA: Diagnosis not present

## 2022-08-05 ENCOUNTER — Other Ambulatory Visit: Payer: Self-pay | Admitting: Neurosurgery

## 2022-08-06 DIAGNOSIS — M5414 Radiculopathy, thoracic region: Secondary | ICD-10-CM | POA: Diagnosis not present

## 2022-08-06 DIAGNOSIS — M5134 Other intervertebral disc degeneration, thoracic region: Secondary | ICD-10-CM | POA: Diagnosis not present

## 2022-08-06 NOTE — Progress Notes (Signed)
Surgical Instructions    Your procedure is scheduled on Tuesday, 08/11/22.  Report to [facility] Main Entrance "A" at 6:00 A.M., then check in with the Admitting office.  Call this number if you have problems the morning of surgery:  [phone]   If you have any questions prior to your surgery date call [phone]: Open Monday-Friday 8am-4pm If you experience any cold or flu symptoms such as cough, fever, chills, shortness of breath, etc. between now and your scheduled surgery, please notify us at the above number     Remember:  Do not eat after midnight the night before your surgery  You may drink clear liquids until 5:00am the morning of your surgery.   Clear liquids allowed are: Water, Non-Citrus Juices (without pulp), Carbonated Beverages, Clear Tea, Black Coffee ONLY (NO MILK, CREAM OR POWDERED CREAMER of any kind), and Gatorade    Take these medicines the morning of surgery with A SIP OF WATER:  Bempedoic Acid (NEXLETOL)  desvenlafaxine (PRISTIQ)  Fluticasone Propionate (XHANCE) nasal spray loratadine (CLARITIN)  Pirfenidone (ESBRIET)   IF NEEDED: acetaminophen (TYLENOL)  acetaminophen-codeine (TYLENOL #3)  carboxymethylcellulose (REFRESH TEARS) eye drops carisoprodol (SOMA)  HYDROcodone-acetaminophen (NORCO/VICODIN)  meclizine (ANTIVERT)  methocarbamol (ROBAXIN)  omeprazole (PRILOSEC)  prochlorperazine (COMPAZINE)   As of today, STOP taking any Aspirin (unless otherwise instructed by your surgeon) Aleve, Naproxen, Ibuprofen, Motrin, Advil, Goody's, BC's, all herbal medications, diclofenac sodium (VOLTAREN), fish oil, and all vitamins.           Do not wear jewelry or makeup. Do not wear lotions, powders, cologne or deodorant. Men may shave face and neck. Do not bring valuables to the hospital. Do not wear nail polish, gel polish, artificial nails, or any other type of covering on natural nails (fingers and toes) If you have artificial nails or gel coating that  need to be removed by a nail salon, please have this removed prior to surgery. Artificial nails or gel coating may interfere with anesthesia's ability to adequately monitor your vital signs.  [facility] is not responsible for any belongings or valuables.    Do NOT Smoke (Tobacco/Vaping)  24 hours prior to your procedure  If you use a CPAP at night, you may bring your mask for your overnight stay.   Contacts, glasses, hearing aids, dentures or partials may not be worn into surgery, please bring cases for these belongings   For patients admitted to the hospital, discharge time will be determined by your treatment team.   Patients discharged the day of surgery will not be allowed to drive home, and someone needs to stay with them for 24 hours.   SURGICAL WAITING ROOM VISITATION Patients having surgery or a procedure may have no more than 2 support people in the waiting area - these visitors may rotate.   Children under the age of 24 must have an adult with them who is not the patient. If the patient needs to stay at the hospital during part of their recovery, the visitor guidelines for inpatient rooms apply. Pre-op nurse will coordinate an appropriate time for 1 support person to accompany patient in pre-op.  This support person may not rotate.   Please refer to [email or web address] for the visitor guidelines for Inpatients (after your surgery is over and you are in a regular room).    Special instructions:    Oral Hygiene is also important to reduce your risk of infection.  Remember - BRUSH YOUR TEETH THE MORNING OF SURGERY WITH YOUR REGULAR  TOOTHPASTE   [facility]- Preparing For Surgery  Before surgery, you can play an important role. Because skin is not sterile, your skin needs to be as free of germs as possible. You can reduce the number of germs on your skin by washing with CHG (chlorahexidine gluconate) Soap before surgery.  CHG is  an antiseptic cleaner which kills germs and bonds with the skin to continue killing germs even after washing.     Please do not use if you have an allergy to CHG or antibacterial soaps. If your skin becomes reddened/irritated stop using the CHG.  Do not shave (including legs and underarms) for at least 48 hours prior to first CHG shower. It is OK to shave your face.  Please follow these instructions carefully.     Shower the NIGHT BEFORE SURGERY and the MORNING OF SURGERY with CHG Soap.   If you chose to wash your hair, wash your hair first as usual with your normal shampoo. After you shampoo, rinse your hair and body thoroughly to remove the shampoo.  Then [employer] and genitals (private parts) with your normal soap and rinse thoroughly to remove soap.  After that Use CHG Soap as you would any other liquid soap. You can apply CHG directly to the skin and wash gently with a scrungie or a clean washcloth.   Apply the CHG Soap to your body ONLY FROM THE NECK DOWN.  Do not use on open wounds or open sores. Avoid contact with your eyes, ears, mouth and genitals (private parts). Wash Face and genitals (private parts)  with your normal soap.   Wash thoroughly, paying special attention to the area where your surgery will be performed.  Thoroughly rinse your body with warm water from the neck down.  DO NOT shower/wash with your normal soap after using and rinsing off the CHG Soap.  Pat yourself dry with a CLEAN TOWEL.  Wear CLEAN PAJAMAS to bed the night before surgery  Place CLEAN SHEETS on your bed the night before your surgery  DO NOT SLEEP WITH PETS.   Day of Surgery: Take a shower with CHG soap. Wear Clean/Comfortable clothing the morning of surgery Do not apply any deodorants/lotions.   Remember to brush your teeth WITH YOUR REGULAR TOOTHPASTE.    If you received a COVID test during your pre-op visit, it is requested that you wear a mask when out in public, stay away from anyone  that may not be feeling well, and notify your surgeon if you develop symptoms. If you have been in contact with anyone that has tested positive in the last 10 days, please notify your surgeon.    Please read over the following fact sheets that you were given.

## 2022-08-07 ENCOUNTER — Telehealth: Payer: Self-pay | Admitting: Internal Medicine

## 2022-08-07 ENCOUNTER — Other Ambulatory Visit: Payer: Self-pay

## 2022-08-07 ENCOUNTER — Encounter (HOSPITAL_COMMUNITY)
Admission: RE | Admit: 2022-08-07 | Discharge: 2022-08-07 | Disposition: A | Discharge status: Home / Self Care | Payer: PPO | Source: Ambulatory Visit | Attending: Neurosurgery | Admitting: Neurosurgery

## 2022-08-07 ENCOUNTER — Encounter (HOSPITAL_COMMUNITY): Payer: Self-pay

## 2022-08-07 VITALS — BP 107/73 | HR 102 | Temp 97.3°F | Resp 18 | Ht 69.0 in | Wt 166.6 lb

## 2022-08-07 DIAGNOSIS — Z01812 Encounter for preprocedural laboratory examination: Secondary | ICD-10-CM | POA: Insufficient documentation

## 2022-08-07 DIAGNOSIS — Z01818 Encounter for other preprocedural examination: Secondary | ICD-10-CM

## 2022-08-07 HISTORY — DX: Depression, unspecified: F32.A

## 2022-08-07 LAB — BASIC METABOLIC PANEL
Anion gap: 9 (ref 5–15)
BUN: 12 mg/dL (ref 8–23)
CO2: 28 mmol/L (ref 22–32)
Calcium: 8.8 mg/dL — ABNORMAL LOW (ref 8.9–10.3)
Chloride: 95 mmol/L — ABNORMAL LOW (ref 98–111)
Creatinine, Ser: 0.72 mg/dL (ref 0.61–1.24)
GFR, Estimated: >60 mL/min (ref >60)
Glucose, Bld: 100 mg/dL — ABNORMAL HIGH (ref 70–99)
Potassium: 4.3 mmol/L (ref 3.5–5.1)
Sodium: 132 mmol/L — ABNORMAL LOW (ref 135–145)

## 2022-08-07 LAB — CBC
HCT: 34.8 % — ABNORMAL LOW (ref 39.0–52.0)
Hemoglobin: 11.9 g/dL — ABNORMAL LOW (ref 13.0–17.0)
MCH: 32.4 pg (ref 26.0–34.0)
MCHC: 34.2 g/dL (ref 30.0–36.0)
MCV: 94.8 fL (ref 80.0–100.0)
Platelets: 234 10*3/uL (ref 150–400)
RBC: 3.67 MIL/uL — ABNORMAL LOW (ref 4.22–5.81)
RDW: 12.8 % (ref 11.5–15.5)
WBC: 6.4 10*3/uL (ref 4.0–10.5)
nRBC: 0 % (ref 0.0–0.2)

## 2022-08-07 LAB — TYPE AND SCREEN
ABO/RH(D): A NEG
Antibody Screen: NEGATIVE

## 2022-08-07 LAB — SURGICAL PCR SCREEN
MRSA, PCR: NEGATIVE
Staphylococcus aureus: NEGATIVE

## 2022-08-07 NOTE — Progress Notes (Addendum)
PCP - Dr.Mark Perini Cardiologist - Dr.Dalton Aundra Dubin Pulmonologist-Dr.Murali Ramaswamy  PPM/ICD - pt denies Device Orders - n/a Rep Notified - n/a  Chest x-ray - 10/28/21 EKG - 04/14/22 Stress Test - 03/02/17 ECHO - 10/29/21 Cardiac Cath - 04/14/22  Sleep Study - pt denies CPAP - n/a  Fasting Blood Sugar - pt denies Diabetes  Checks Blood Sugar _____ times a day  Last dose of GLP1 agonist-  pt denies GLP1 instructions: n/a  Blood Thinner Instructions:pt denies Aspirin Instructions:As of today, STOP taking any Aspirin (unless otherwise instructed by your surgeon) Aleve, Naproxen, Ibuprofen, Motrin, Advil, Goody's, BC's, all herbal medications, diclofenac sodium (VOLTAREN), fish oil, and all vitamins.    NPO after Midnight  PRE-SURGERY Ensure or G2- none ordered   COVID TEST- n/a   Anesthesia review: YES, idiopathic pulmonary fibrosis. In EPIC on 08/07/22 Clearance request was sent to [facility]. Pt has Diffuse large B cell lymphoma.  Patient denies shortness of breath, fever, cough and chest pain at PAT appointment   All instructions explained to the patient, with a verbal understanding of the material. Patient agrees to go over the instructions while at home for a better understanding. Patient also instructed to self quarantine after being tested for COVID-19. The opportunity to ask questions was provided.

## 2022-08-07 NOTE — Progress Notes (Signed)
Surgical Instructions    Your procedure is scheduled on Tuesday, 08/11/22.  Report to [facility] Main Entrance "A" at 6:00 A.M., then check in with the Admitting office.  Call this number if you have problems the morning of surgery:  [phone]   If you have any questions prior to your surgery date call [phone]: Open Monday-Friday 8am-4pm If you experience any cold or flu symptoms such as cough, fever, chills, shortness of breath, etc. between now and your scheduled surgery, please notify us at the above number     Remember:  Do not eat or drink after midnight the night before your surgery     Take these medicines the morning of surgery with A SIP OF WATER:  Bempedoic Acid (NEXLETOL)  desvenlafaxine (PRISTIQ)  Fluticasone Propionate (XHANCE) nasal spray loratadine (CLARITIN)  Pirfenidone (ESBRIET)   IF NEEDED: acetaminophen (TYLENOL)  OR acetaminophen-codeine (TYLENOL #3)  carboxymethylcellulose (REFRESH TEARS) eye drops carisoprodol (SOMA)  HYDROcodone-acetaminophen (NORCO/VICODIN)  meclizine (ANTIVERT)  methocarbamol (ROBAXIN)  omeprazole (PRILOSEC)  prochlorperazine (COMPAZINE)   As of today, STOP taking any Aspirin (unless otherwise instructed by your surgeon) Aleve, Naproxen, Ibuprofen, Motrin, Advil, Goody's, BC's, all herbal medications, diclofenac sodium (VOLTAREN), fish oil, and all vitamins.           Do not wear jewelry. Do not wear lotions, powders, cologne or deodorant. Men may shave face and neck. Do not bring valuables to the hospital. Do not wear nail polish, gel polish, artificial nails, or any other type of covering on natural nails (fingers and toes)  [facility] is not responsible for any belongings or valuables.    Do NOT Smoke (Tobacco/Vaping)  24 hours prior to your procedure  If you use a CPAP at night, you may bring your mask for your overnight stay.   Contacts, glasses, hearing aids, dentures or partials may not be worn into surgery,  please bring cases for these belongings   For patients admitted to the hospital, discharge time will be determined by your treatment team.   Patients discharged the day of surgery will not be allowed to drive home, and someone needs to stay with them for 24 hours.   SURGICAL WAITING ROOM VISITATION Patients having surgery or a procedure may have no more than 2 support people in the waiting area - these visitors may rotate.   Children under the age of 81 must have an adult with them who is not the patient. If the patient needs to stay at the hospital during part of their recovery, the visitor guidelines for inpatient rooms apply. Pre-op nurse will coordinate an appropriate time for 1 support person to accompany patient in pre-op.  This support person may not rotate.   Please refer to [email or web address] for the visitor guidelines for Inpatients (after your surgery is over and you are in a regular room).    Special instructions:    Oral Hygiene is also important to reduce your risk of infection.  Remember - BRUSH YOUR TEETH THE MORNING OF SURGERY WITH YOUR REGULAR TOOTHPASTE   [facility]- Preparing For Surgery  Before surgery, you can play an important role. Because skin is not sterile, your skin needs to be as free of germs as possible. You can reduce the number of germs on your skin by washing with CHG (chlorahexidine gluconate) Soap before surgery.  CHG is an antiseptic cleaner which kills germs and bonds with the skin to continue killing germs even after washing.     Please do not  use if you have an allergy to CHG or antibacterial soaps. If your skin becomes reddened/irritated stop using the CHG.  Do not shave (including legs and underarms) for at least 48 hours prior to first CHG shower. It is OK to shave your face.  Please follow these instructions carefully.     Shower the NIGHT BEFORE SURGERY and the MORNING OF SURGERY with CHG  Soap.   If you chose to wash your hair, wash your hair first as usual with your normal shampoo. After you shampoo, rinse your hair and body thoroughly to remove the shampoo.  Then [employer] and genitals (private parts) with your normal soap and rinse thoroughly to remove soap.  After that Use CHG Soap as you would any other liquid soap. You can apply CHG directly to the skin and wash gently with a scrungie or a clean washcloth.   Apply the CHG Soap to your body ONLY FROM THE NECK DOWN.  Do not use on open wounds or open sores. Avoid contact with your eyes, ears, mouth and genitals (private parts). Wash Face and genitals (private parts)  with your normal soap.   Wash thoroughly, paying special attention to the area where your surgery will be performed.  Thoroughly rinse your body with warm water from the neck down.  DO NOT shower/wash with your normal soap after using and rinsing off the CHG Soap.  Pat yourself dry with a CLEAN TOWEL.  Wear CLEAN PAJAMAS to bed the night before surgery  Place CLEAN SHEETS on your bed the night before your surgery  DO NOT SLEEP WITH PETS.   Day of Surgery: Take a shower with CHG soap. Wear Clean/Comfortable clothing the morning of surgery Do not apply any deodorants/lotions.   Remember to brush your teeth WITH YOUR REGULAR TOOTHPASTE.    If you received a COVID test during your pre-op visit, it is requested that you wear a mask when out in public, stay away from anyone that may not be feeling well, and notify your surgeon if you develop symptoms. If you have been in contact with anyone that has tested positive in the last 10 days, please notify your surgeon.    Please read over the following fact sheets that you were given.

## 2022-08-07 NOTE — Telephone Encounter (Signed)
Fax received from Dr. Mallie Mussel pool with [facility] to perform a C4-5,C5-6,C6-7 ANTERIOR CERVICAL FUSION with GENERAL ANESTHESIA on patient.  Patient needs surgery clearance. Surgery is 08/11/2022. Patient was seen on 06/23/2022. Office protocol is a risk assessment can be sent to surgeon if patient has been seen in 60 days or less.   Sending to Dr [name] for risk assessment or recommendations if patient needs to be seen in office prior to surgical procedure.

## 2022-08-10 ENCOUNTER — Encounter (HOSPITAL_COMMUNITY): Payer: Self-pay | Admitting: Neurosurgery

## 2022-08-10 DIAGNOSIS — M5412 Radiculopathy, cervical region: Secondary | ICD-10-CM | POA: Diagnosis not present

## 2022-08-10 NOTE — Progress Notes (Signed)
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  He was recently referred by his pulmonologist Dr. [name] to cardiology for evaluation of dyspnea and tachycardia. After completing CHOP in 4/23, patient developed persistent sinus tachycardia. He felt palpitations. D dimer was normal. No evidence for infection. Echo in 4/23 showed EF 55-60%, normal RV, unable to assess PA pressure (no TR jet), and nodular calcification of the mitral valve. Due to mitral valve abnormality, blood cultures were done which were negative. Sinus tachycardia eventually subsided on its own.  He recently underwent [facility] 10/23 showing mild LAD lesion 40% stenosed, normal filling pressures, normal PA pressure, normal CO. last seen by Allena Katz, FNP in the advanced heart failure clinic on 04/29/2022 was noted to be feeling well from cardiac standpoint.  At that time he reported only mild shortness of breath with increased activity no longer having sinus tachycardia.  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  Recommended consider sleep study to rule out OSA.  Patient last seen by pulmonologist Dr. [name] on 06/23/2022.  Per note, his IPF was noted to be slowly getting worse but recently stable.  His fatigue was noted to have improved somewhat with distance from chemo and passage of time as well as exercise.  No changes to management, 66-monthfollow-up recommended  Patient last seen by oncologist Dr. SBenay Spiceon 08/04/2021.  Stable at that time.  Per note, "Dr. YAnnamaria Bootsis [place] He has chronic mild anemia, likely related to chronic disease and treatment of lymphoma and prostate cancer. He will return for an office and lab visit in 4 months. He will call in  the interim for new symptoms."  Preop labs reviewed, mild hyponatremia sodium 132, mild anemia with hemoglobin 11.9, otherwise unremarkable.  EKG 04/14/2022: Normal sinus rhythm. Rate 70. Minimal voltage criteria for LVH, may be normal variant ( R in aVL )  CHEST - 2 VIEW 10/28/21: COMPARISON:  06/01/2017   FINDINGS: Heart size is UPPER limits normal. Coarse interstitial markings are again noted, with a peripheral predominance. Markings are more prominent compared with prior studies. No focal consolidations or effusions.   IMPRESSION: Progression of coarse interstitial markings, consistent with fibrosis.  High-resolution CT chest 03/19/2021: IMPRESSION: 1. Slight interval progression of pulmonary fibrosis. Findings are consistent with UIP per consensus guidelines: Diagnosis of Idiopathic Pulmonary Fibrosis: An Official ATS/ERS/JRS/ALAT Clinical Practice Guideline. [place] Iss 5, [id number] Mar 06 2017. 2. New small sclerotic lesion in the lateral aspect of the right third rib, worrisome for metastatic prostate cancer. 3. Lucent lesion in the left posterolateral aspect of the T8 vertebral body corresponds to abnormal uptake on bone scan 05/21/2020, and is new from CT chest 03/28/2019, raising suspicion for malignancy. 4. Left internal and high left subpectoral lymph nodes are subcentimeter in size but appear slightly larger than on 03/28/2019. Difficult to exclude a lymphoproliferative disorder. Please refer to CT neck with contrast done the same day in further evaluation. 5. Slight compression of the T12 superior endplate is new but age indeterminate. 6. Aortic atherosclerosis (ICD10-I70.0). Coronary artery calcification. 7. Enlarged pulmonary arteries, indicative of pulmonary arterial hypertension.  Right/Left heart cath 04/14/2022: 1. Normal filling pressures.  2. Normal PA pressure.  3. Normal cardiac output.  4. Mild nonobstructive CAD.  TTE 10/29/21:  1. Left ventricular ejection fraction, by estimation, is 55 to 60%. The  left ventricle has normal function. The left ventricle has no regional  wall motion abnormalities. Left ventricular diastolic parameters are  consistent with Grade I diastolic  dysfunction (impaired relaxation). The average left ventricular global  longitudinal strain is -18.2 %. The global longitudinal strain is normal.   2. Right ventricular systolic function is normal. The right ventricular  size is normal. Tricuspid regurgitation signal is inadequate for assessing  PA pressure.   3. The mitral valve is normal in structure. Trivial mitral valve  regurgitation. No evidence of mitral stenosis.   4. The aortic valve is calcified. Aortic valve regurgitation is not  visualized. No aortic stenosis is present.   5. The inferior vena cava is normal in size with greater than 50%  respiratory variability, suggesting right atrial pressure of 3 mmHg.     Wynonia Musty [facility] Short Stay Center/Anesthesiology Phone [phone] 08/10/2022 9:58 AM

## 2022-08-10 NOTE — Telephone Encounter (Signed)
OV notes and clearance form have been faxed back to [facility] . Nothing further needed at this time.  

## 2022-08-10 NOTE — Anesthesia Preprocedure Evaluation (Addendum)
Anesthesia Evaluation  Patient identified by MRN, date of birth, ID band Patient awake    Reviewed: Allergy & Precautions, NPO status , Patient's Chart, lab work & pertinent test results, reviewed documented beta blocker date and time   Airway Mallampati: II  TM Distance: >3 FB Neck ROM: Limited    Dental  (+) Teeth Intact, Caps, Dental Advisory Given   Pulmonary pneumonia, resolved Interstitial pulmonary fibrosis slowly progressing ILD   Pulmonary exam normal breath sounds clear to auscultation       Cardiovascular + CAD  Normal cardiovascular exam Rhythm:Regular Rate:Normal  Cardiac cath 04/14/22   Mid LAD lesion is 40% stenosed.   1. Normal filling pressures.  2. Normal PA pressure.  3. Normal cardiac output.  4. Mild nonobstructive CAD.   Echo 10/09/21 1. Left ventricular ejection fraction, by estimation, is 55 to 60%. The  left ventricle has normal function. The left ventricle has no regional  wall motion abnormalities. Left ventricular diastolic parameters are  consistent with Grade I diastolic dysfunction (impaired relaxation). The average left ventricular global longitudinal strain is -18.2 %. The global longitudinal strain is normal.   2. Right ventricular systolic function is normal. The right ventricular  size is normal. Tricuspid regurgitation signal is inadequate for assessing  PA pressure.   3. The mitral valve is normal in structure. Trivial mitral valve  regurgitation. No evidence of mitral stenosis.   4. The aortic valve is calcified. Aortic valve regurgitation is not  visualized. No aortic stenosis is present.   5. The inferior vena cava is normal in size with greater than 50%  respiratory variability, suggesting right atrial pressure of 3 mmHg.   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   Anesthesia Other Findings   Reproductive/Obstetrics                              Anesthesia Physical Anesthesia Plan  ASA: 3  Anesthesia Plan: General   Post-op Pain Management: Dilaudid IV, Ofirmev IV (intra-op)* and Precedex   Induction:   PONV Risk Score and Plan: 2 and Treatment may vary due to age or medical condition, Ondansetron and Dexamethasone  Airway Management Planned: Oral ETT and Video Laryngoscope Planned  Additional Equipment: None  Intra-op Plan:   Post-operative Plan: Extubation in OR  Informed Consent: I have reviewed the patients History and Physical, chart, labs and discussed the procedure including the risks, benefits and alternatives for the proposed anesthesia with the patient or authorized representative who has indicated his/her understanding and acceptance.     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  He was recently referred by his pulmonologist Dr. [name] to cardiology for evaluation  of dyspnea and tachycardia. After completing CHOP in 4/23, patient developed persistent sinus tachycardia. He felt palpitations. D dimer was normal. No evidence for infection. Echo in 4/23 showed EF 55-60%, normal RV, unable to assess PA pressure (no TR  jet), and nodular calcification of the mitral valve. Due to mitral valve abnormality, blood cultures were done which were negative. Sinus tachycardia eventually subsided on its own.  He recently underwent [facility] 10/23 showing mild LAD lesion 40% stenosed, normal filling pressures, normal PA pressure, normal CO. last seen by Allena Katz, FNP in the advanced heart failure clinic on 04/29/2022 was noted to be feeling well from cardiac standpoint.  At that time he reported only mild shortness of breath with increased activity no longer having sinus tachycardia.  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  Recommended consider sleep study to rule out OSA.  Patient last seen by pulmonologist Dr. [name] on 06/23/2022.  Per note, his IPF was noted to be slowly getting worse but recently stable.  His fatigue was noted to have improved somewhat with distance from chemo and passage of time as well as exercise.  No changes to management, 46-monthfollow-up recommended  Patient last seen by oncologist Dr. SBenay Spiceon 08/04/2021.  Stable at that time.  Per note, "Dr. YAnnamaria Bootsis [place] He has chronic mild anemia, likely related to chronic disease and treatment of lymphoma and prostate cancer. He will return for an office and lab visit in 4 months. He will call in the interim for new symptoms."  Preop labs reviewed, mild hyponatremia sodium 132, mild anemia with hemoglobin 11.9, otherwise unremarkable.  EKG 04/14/2022: Normal sinus rhythm. Rate 70. Minimal voltage criteria for LVH, may be normal variant ( R in aVL )  CHEST - 2 VIEW 10/28/21: COMPARISON:  06/01/2017   FINDINGS: Heart size is UPPER limits normal. Coarse interstitial markings are again noted, with a peripheral predominance. Markings are more prominent compared with prior studies. No focal consolidations or effusions.   IMPRESSION: Progression of coarse interstitial  markings, consistent with fibrosis.  High-resolution CT chest 03/19/2021: IMPRESSION: 1. Slight interval progression of pulmonary fibrosis. Findings are consistent with UIP per consensus guidelines: Diagnosis of Idiopathic Pulmonary Fibrosis: An Official ATS/ERS/JRS/ALAT Clinical Practice Guideline. [place] Iss 5, [phone] Mar 06 2017. 2. New small sclerotic lesion in the lateral aspect of the right third rib, worrisome for metastatic prostate cancer. 3. Lucent lesion in the left posterolateral aspect of the T8 vertebral body corresponds to abnormal uptake on bone scan 05/21/2020, and is new from CT chest 03/28/2019, raising suspicion for malignancy. 4. Left internal and high left subpectoral lymph nodes are subcentimeter in size but appear slightly larger than on 03/28/2019. Difficult to exclude a lymphoproliferative disorder. Please refer to CT neck with contrast done the same day in further evaluation. 5. Slight compression of the T12 superior endplate is new but age indeterminate. 6. Aortic atherosclerosis (ICD10-I70.0). Coronary artery calcification. 7. Enlarged pulmonary arteries, indicative of pulmonary arterial hypertension.  Right/Left heart cath 04/14/2022: 1. Normal filling pressures.  2. Normal PA pressure.  3. Normal cardiac output.  4. Mild nonobstructive CAD.   TTE 10/29/21:  1. Left ventricular ejection fraction, by estimation, is 55 to 60%. The  left ventricle has normal function. The left ventricle has no regional  wall motion abnormalities. Left ventricular diastolic parameters are  consistent with Grade I diastolic  dysfunction (impaired relaxation). The  average left ventricular global  longitudinal strain is -18.2 %. The global longitudinal strain is normal.   2. Right ventricular systolic function is normal. The right ventricular  size is normal. Tricuspid regurgitation signal is inadequate for assessing  PA pressure.   3. The  mitral valve is normal in structure. Trivial mitral valve  regurgitation. No evidence of mitral stenosis.   4. The aortic valve is calcified. Aortic valve regurgitation is not  visualized. No aortic stenosis is present.   5. The inferior vena cava is normal in size with greater than 50%  respiratory variability, suggesting right atrial pressure of 3 mmHg.     )         Anesthesia Quick Evaluation

## 2022-08-10 NOTE — Telephone Encounter (Signed)
I spoke to patient and DR Pool: explauined risk and low prob of GA causing ILD flare . Patient will be kept overnight in the hospital

## 2022-08-11 ENCOUNTER — Inpatient Hospital Stay (HOSPITAL_COMMUNITY)
Admission: RE | Admit: 2022-08-11 | Discharge: 2022-08-12 | DRG: 473 | Disposition: A | Discharge status: Skilled Nursing Facility | Payer: PPO | Attending: Neurosurgery | Admitting: Neurosurgery

## 2022-08-11 ENCOUNTER — Inpatient Hospital Stay (HOSPITAL_COMMUNITY): Payer: PPO

## 2022-08-11 ENCOUNTER — Inpatient Hospital Stay (HOSPITAL_COMMUNITY): Payer: PPO | Admitting: Physician Assistant

## 2022-08-11 ENCOUNTER — Encounter (HOSPITAL_COMMUNITY): Payer: Self-pay | Admitting: Neurosurgery

## 2022-08-11 ENCOUNTER — Encounter (HOSPITAL_COMMUNITY)
Admission: RE | Disposition: A | Discharge status: Skilled Nursing Facility | Payer: Self-pay | Source: Home / Self Care | Attending: Neurosurgery

## 2022-08-11 ENCOUNTER — Other Ambulatory Visit: Payer: Self-pay

## 2022-08-11 DIAGNOSIS — Z79899 Other long term (current) drug therapy: Secondary | ICD-10-CM

## 2022-08-11 DIAGNOSIS — M50121 Cervical disc disorder at C4-C5 level with radiculopathy: Secondary | ICD-10-CM | POA: Diagnosis not present

## 2022-08-11 DIAGNOSIS — K219 Gastro-esophageal reflux disease without esophagitis: Secondary | ICD-10-CM | POA: Diagnosis not present

## 2022-08-11 DIAGNOSIS — Z8546 Personal history of malignant neoplasm of prostate: Secondary | ICD-10-CM | POA: Diagnosis not present

## 2022-08-11 DIAGNOSIS — G959 Disease of spinal cord, unspecified: Secondary | ICD-10-CM | POA: Diagnosis not present

## 2022-08-11 DIAGNOSIS — M5000 Cervical disc disorder with myelopathy, unspecified cervical region: Secondary | ICD-10-CM | POA: Diagnosis not present

## 2022-08-11 DIAGNOSIS — M5010 Cervical disc disorder with radiculopathy, unspecified cervical region: Secondary | ICD-10-CM | POA: Diagnosis not present

## 2022-08-11 DIAGNOSIS — Z7982 Long term (current) use of aspirin: Secondary | ICD-10-CM | POA: Diagnosis not present

## 2022-08-11 DIAGNOSIS — I2510 Atherosclerotic heart disease of native coronary artery without angina pectoris: Secondary | ICD-10-CM | POA: Diagnosis not present

## 2022-08-11 DIAGNOSIS — M4722 Other spondylosis with radiculopathy, cervical region: Principal | ICD-10-CM | POA: Diagnosis present

## 2022-08-11 DIAGNOSIS — Z8052 Family history of malignant neoplasm of bladder: Secondary | ICD-10-CM | POA: Diagnosis not present

## 2022-08-11 DIAGNOSIS — M4322 Fusion of spine, cervical region: Secondary | ICD-10-CM | POA: Diagnosis not present

## 2022-08-11 DIAGNOSIS — Z8572 Personal history of non-Hodgkin lymphomas: Secondary | ICD-10-CM | POA: Diagnosis not present

## 2022-08-11 DIAGNOSIS — J8410 Pulmonary fibrosis, unspecified: Secondary | ICD-10-CM | POA: Diagnosis present

## 2022-08-11 DIAGNOSIS — Z923 Personal history of irradiation: Secondary | ICD-10-CM | POA: Diagnosis not present

## 2022-08-11 DIAGNOSIS — M5412 Radiculopathy, cervical region: Secondary | ICD-10-CM | POA: Diagnosis not present

## 2022-08-11 DIAGNOSIS — E785 Hyperlipidemia, unspecified: Secondary | ICD-10-CM | POA: Diagnosis not present

## 2022-08-11 DIAGNOSIS — D638 Anemia in other chronic diseases classified elsewhere: Secondary | ICD-10-CM | POA: Diagnosis not present

## 2022-08-11 DIAGNOSIS — Z8249 Family history of ischemic heart disease and other diseases of the circulatory system: Secondary | ICD-10-CM | POA: Diagnosis not present

## 2022-08-11 DIAGNOSIS — M4802 Spinal stenosis, cervical region: Secondary | ICD-10-CM | POA: Diagnosis not present

## 2022-08-11 DIAGNOSIS — M4712 Other spondylosis with myelopathy, cervical region: Principal | ICD-10-CM | POA: Diagnosis present

## 2022-08-11 DIAGNOSIS — M50021 Cervical disc disorder at C4-C5 level with myelopathy: Secondary | ICD-10-CM | POA: Diagnosis not present

## 2022-08-11 HISTORY — PX: ANTERIOR CERVICAL DECOMP/DISCECTOMY FUSION: SHX1161

## 2022-08-11 LAB — ABO/RH: ABO/RH(D): A NEG

## 2022-08-11 SURGERY — ANTERIOR CERVICAL DECOMPRESSION/DISCECTOMY FUSION 3 LEVELS
Anesthesia: General

## 2022-08-11 MED ORDER — MENTHOL 3 MG MT LOZG: 1.0000 | LOZENGE | OROMUCOSAL | Status: DC | PRN

## 2022-08-11 MED ORDER — CHLORHEXIDINE GLUCONATE CLOTH 2 % EX PADS: 6.0000 | MEDICATED_PAD | Freq: Once | CUTANEOUS | Status: DC

## 2022-08-11 MED ORDER — THROMBIN 5000 UNITS EX SOLR
OROMUCOSAL | Status: DC | PRN
  Administered 2022-08-11: 5 mL via TOPICAL

## 2022-08-11 MED ORDER — OXYCODONE HCL 5 MG/5ML PO SOLN: 5.0000 mg | Freq: Once | ORAL | Status: DC | PRN

## 2022-08-11 MED ORDER — SODIUM CHLORIDE 0.9 % IV SOLN
250.0000 mL | INTRAVENOUS | Status: DC
  Administered 2022-08-11: 250 mL via INTRAVENOUS

## 2022-08-11 MED ORDER — SODIUM CHLORIDE 0.9% FLUSH
3.0000 mL | Freq: Two times a day (BID) | INTRAVENOUS | Status: DC
  Administered 2022-08-11: 3 mL via INTRAVENOUS

## 2022-08-11 MED ORDER — PHENYLEPHRINE HCL-NACL 20-0.9 MG/250ML-% IV SOLN
INTRAVENOUS | Status: AC
  Filled 2022-08-11: qty 250

## 2022-08-11 MED ORDER — ROCURONIUM BROMIDE 10 MG/ML (PF) SYRINGE
PREFILLED_SYRINGE | INTRAVENOUS | Status: DC | PRN
  Administered 2022-08-11: 70 mg via INTRAVENOUS
  Administered 2022-08-11 (×2): 10 mg via INTRAVENOUS

## 2022-08-11 MED ORDER — PANTOPRAZOLE SODIUM 40 MG PO TBEC
40.0000 mg | DELAYED_RELEASE_TABLET | Freq: Every day | ORAL | Status: DC
  Filled 2022-08-11: qty 1

## 2022-08-11 MED ORDER — ONDANSETRON HCL 4 MG PO TABS: 4.0000 mg | ORAL_TABLET | Freq: Four times a day (QID) | ORAL | Status: DC | PRN

## 2022-08-11 MED ORDER — THROMBIN 5000 UNITS EX SOLR
CUTANEOUS | Status: AC
  Filled 2022-08-11: qty 5000

## 2022-08-11 MED ORDER — ADULT MULTIVITAMIN W/MINERALS CH: 1.0000 | ORAL_TABLET | Freq: Every day | ORAL | Status: DC

## 2022-08-11 MED ORDER — HYDROMORPHONE HCL 1 MG/ML IJ SOLN: 1.0000 mg | INTRAMUSCULAR | Status: DC | PRN

## 2022-08-11 MED ORDER — BEMPEDOIC ACID 180 MG PO TABS: 180.0000 mg | ORAL_TABLET | Freq: Every day | ORAL | Status: DC

## 2022-08-11 MED ORDER — DEXAMETHASONE SODIUM PHOSPHATE 4 MG/ML IJ SOLN: 4.0000 mg | Freq: Four times a day (QID) | INTRAMUSCULAR | Status: DC

## 2022-08-11 MED ORDER — LIDOCAINE 2% (20 MG/ML) 5 ML SYRINGE
INTRAMUSCULAR | Status: AC
  Filled 2022-08-11: qty 5

## 2022-08-11 MED ORDER — ACETAMINOPHEN 650 MG RE SUPP: 650.0000 mg | RECTAL | Status: DC | PRN

## 2022-08-11 MED ORDER — OXYBUTYNIN CHLORIDE 5 MG PO TABS
5.0000 mg | ORAL_TABLET | Freq: Every day | ORAL | Status: DC
  Filled 2022-08-11: qty 1

## 2022-08-11 MED ORDER — PROPOFOL 10 MG/ML IV BOLUS
INTRAVENOUS | Status: AC
  Filled 2022-08-11: qty 20

## 2022-08-11 MED ORDER — HYDROMORPHONE HCL 1 MG/ML IJ SOLN
INTRAMUSCULAR | Status: DC | PRN
  Administered 2022-08-11: .5 mg via INTRAVENOUS

## 2022-08-11 MED ORDER — THROMBIN 20000 UNITS EX SOLR
CUTANEOUS | Status: AC
  Filled 2022-08-11: qty 20000

## 2022-08-11 MED ORDER — LACTATED RINGERS IV SOLN: INTRAVENOUS | Status: DC

## 2022-08-11 MED ORDER — ACETAMINOPHEN 10 MG/ML IV SOLN
INTRAVENOUS | Status: AC
  Filled 2022-08-11: qty 100

## 2022-08-11 MED ORDER — LORATADINE 10 MG PO TABS: 10.0000 mg | ORAL_TABLET | Freq: Every day | ORAL | Status: DC

## 2022-08-11 MED ORDER — ASPIRIN 81 MG PO TBEC: 81.0000 mg | DELAYED_RELEASE_TABLET | ORAL | Status: DC

## 2022-08-11 MED ORDER — DEXAMETHASONE 4 MG PO TABS
4.0000 mg | ORAL_TABLET | Freq: Four times a day (QID) | ORAL | Status: DC
  Administered 2022-08-11 – 2022-08-12 (×4): 4 mg via ORAL
  Filled 2022-08-11 (×4): qty 1

## 2022-08-11 MED ORDER — ACETAMINOPHEN 325 MG PO TABS
ORAL_TABLET | ORAL | Status: AC
  Filled 2022-08-11: qty 2

## 2022-08-11 MED ORDER — METHOCARBAMOL 1000 MG/10ML IJ SOLN: 500.0000 mg | Freq: Four times a day (QID) | INTRAVENOUS | Status: DC | PRN

## 2022-08-11 MED ORDER — DEXAMETHASONE SODIUM PHOSPHATE 10 MG/ML IJ SOLN
INTRAMUSCULAR | Status: DC | PRN
  Administered 2022-08-11: 10 mg via INTRAVENOUS

## 2022-08-11 MED ORDER — CO-ENZYME Q-10 30 MG PO CAPS: ORAL_CAPSULE | Freq: Every evening | ORAL | Status: DC

## 2022-08-11 MED ORDER — OXYCODONE HCL 5 MG PO TABS: 5.0000 mg | ORAL_TABLET | Freq: Once | ORAL | Status: DC | PRN

## 2022-08-11 MED ORDER — ROCURONIUM BROMIDE 10 MG/ML (PF) SYRINGE
PREFILLED_SYRINGE | INTRAVENOUS | Status: AC
  Filled 2022-08-11: qty 10

## 2022-08-11 MED ORDER — ONDANSETRON HCL 4 MG/2ML IJ SOLN
INTRAMUSCULAR | Status: DC | PRN
  Administered 2022-08-11: 4 mg via INTRAVENOUS

## 2022-08-11 MED ORDER — VENLAFAXINE HCL ER 75 MG PO CP24: 75.0000 mg | ORAL_CAPSULE | Freq: Every day | ORAL | Status: DC

## 2022-08-11 MED ORDER — PHENYLEPHRINE HCL-NACL 20-0.9 MG/250ML-% IV SOLN
INTRAVENOUS | Status: DC | PRN
  Administered 2022-08-11: 30 ug/min via INTRAVENOUS

## 2022-08-11 MED ORDER — 0.9 % SODIUM CHLORIDE (POUR BTL) OPTIME
TOPICAL | Status: DC | PRN
  Administered 2022-08-11: 1000 mL

## 2022-08-11 MED ORDER — ACETAMINOPHEN 325 MG PO TABS: 650.0000 mg | ORAL_TABLET | ORAL | Status: DC | PRN

## 2022-08-11 MED ORDER — GLUCOSAMINE SULFATE 500 MG PO TABS: 500.0000 mg | ORAL_TABLET | Freq: Every day | ORAL | Status: DC

## 2022-08-11 MED ORDER — DICLOFENAC SODIUM 75 MG PO TBEC: 75.0000 mg | DELAYED_RELEASE_TABLET | Freq: Two times a day (BID) | ORAL | Status: DC | PRN

## 2022-08-11 MED ORDER — SUGAMMADEX SODIUM 200 MG/2ML IV SOLN
INTRAVENOUS | Status: DC | PRN
  Administered 2022-08-11: 200 mg via INTRAVENOUS

## 2022-08-11 MED ORDER — CEFAZOLIN SODIUM-DEXTROSE 1-4 GM/50ML-% IV SOLN
1.0000 g | Freq: Three times a day (TID) | INTRAVENOUS | Status: AC
  Administered 2022-08-11 (×2): 1 g via INTRAVENOUS
  Filled 2022-08-11 (×2): qty 50

## 2022-08-11 MED ORDER — HYDROMORPHONE HCL 1 MG/ML IJ SOLN
0.2500 mg | INTRAMUSCULAR | Status: DC | PRN
  Administered 2022-08-11: 0.5 mg via INTRAVENOUS

## 2022-08-11 MED ORDER — ACETAMINOPHEN 10 MG/ML IV SOLN
INTRAVENOUS | Status: DC | PRN
  Administered 2022-08-11: 1000 mg via INTRAVENOUS

## 2022-08-11 MED ORDER — ONDANSETRON HCL 4 MG/2ML IJ SOLN: 4.0000 mg | Freq: Once | INTRAMUSCULAR | Status: DC | PRN

## 2022-08-11 MED ORDER — RISAQUAD PO CAPS
1.0000 | ORAL_CAPSULE | Freq: Every day | ORAL | Status: DC
  Administered 2022-08-11: 1 via ORAL
  Filled 2022-08-11: qty 1

## 2022-08-11 MED ORDER — CHLORHEXIDINE GLUCONATE 0.12 % MT SOLN
15.0000 mL | Freq: Once | OROMUCOSAL | Status: AC
  Administered 2022-08-11: 15 mL via OROMUCOSAL
  Filled 2022-08-11: qty 15

## 2022-08-11 MED ORDER — ONDANSETRON HCL 4 MG PO TABS: 8.0000 mg | ORAL_TABLET | Freq: Three times a day (TID) | ORAL | Status: DC | PRN

## 2022-08-11 MED ORDER — PHENOL 1.4 % MT LIQD: 1.0000 | OROMUCOSAL | Status: DC | PRN

## 2022-08-11 MED ORDER — AMISULPRIDE (ANTIEMETIC) 5 MG/2ML IV SOLN: 10.0000 mg | Freq: Once | INTRAVENOUS | Status: DC | PRN

## 2022-08-11 MED ORDER — METHOCARBAMOL 500 MG PO TABS
500.0000 mg | ORAL_TABLET | Freq: Four times a day (QID) | ORAL | Status: DC | PRN
  Administered 2022-08-11 – 2022-08-12 (×3): 500 mg via ORAL
  Filled 2022-08-11 (×3): qty 1

## 2022-08-11 MED ORDER — HYDROCODONE-ACETAMINOPHEN 5-325 MG PO TABS: 1.0000 | ORAL_TABLET | ORAL | Status: DC | PRN

## 2022-08-11 MED ORDER — ORAL CARE MOUTH RINSE: 15.0000 mL | Freq: Once | OROMUCOSAL | Status: AC

## 2022-08-11 MED ORDER — PIRFENIDONE 801 MG PO TABS: 1.0000 | ORAL_TABLET | Freq: Three times a day (TID) | ORAL | Status: DC

## 2022-08-11 MED ORDER — SODIUM CHLORIDE 0.9% FLUSH: 3.0000 mL | INTRAVENOUS | Status: DC | PRN

## 2022-08-11 MED ORDER — VITAMIN D 25 MCG (1000 UNIT) PO TABS: 2000.0000 [IU] | ORAL_TABLET | Freq: Every evening | ORAL | Status: DC

## 2022-08-11 MED ORDER — EPHEDRINE SULFATE-NACL 50-0.9 MG/10ML-% IV SOSY
PREFILLED_SYRINGE | INTRAVENOUS | Status: DC | PRN
  Administered 2022-08-11: 5 mg via INTRAVENOUS
  Administered 2022-08-11 (×2): 10 mg via INTRAVENOUS

## 2022-08-11 MED ORDER — FENTANYL CITRATE (PF) 250 MCG/5ML IJ SOLN
INTRAMUSCULAR | Status: DC | PRN
  Administered 2022-08-11: 100 ug via INTRAVENOUS
  Administered 2022-08-11 (×3): 50 ug via INTRAVENOUS

## 2022-08-11 MED ORDER — ONDANSETRON HCL 4 MG/2ML IJ SOLN
INTRAMUSCULAR | Status: AC
  Filled 2022-08-11: qty 2

## 2022-08-11 MED ORDER — ONDANSETRON HCL 4 MG/2ML IJ SOLN: 4.0000 mg | Freq: Four times a day (QID) | INTRAMUSCULAR | Status: DC | PRN

## 2022-08-11 MED ORDER — CARISOPRODOL 350 MG PO TABS: 175.0000 mg | ORAL_TABLET | Freq: Three times a day (TID) | ORAL | Status: DC | PRN

## 2022-08-11 MED ORDER — HYDROCODONE-ACETAMINOPHEN 10-325 MG PO TABS
1.0000 | ORAL_TABLET | ORAL | Status: DC | PRN
  Administered 2022-08-11 – 2022-08-12 (×4): 1 via ORAL
  Filled 2022-08-11 (×4): qty 1

## 2022-08-11 MED ORDER — VITAMIN B-12 1000 MCG PO TABS
1000.0000 ug | ORAL_TABLET | Freq: Every day | ORAL | Status: DC
  Administered 2022-08-11: 1000 ug via ORAL
  Filled 2022-08-11: qty 1

## 2022-08-11 MED ORDER — HYDROMORPHONE HCL 1 MG/ML IJ SOLN
INTRAMUSCULAR | Status: AC
  Filled 2022-08-11: qty 0.5

## 2022-08-11 MED ORDER — ALUM & MAG HYDROXIDE-SIMETH 200-200-20 MG/5ML PO SUSP
30.0000 mL | Freq: Four times a day (QID) | ORAL | Status: DC | PRN
  Administered 2022-08-11: 30 mL via ORAL
  Filled 2022-08-11: qty 30

## 2022-08-11 MED ORDER — FENTANYL CITRATE (PF) 250 MCG/5ML IJ SOLN
INTRAMUSCULAR | Status: AC
  Filled 2022-08-11: qty 5

## 2022-08-11 MED ORDER — CEFAZOLIN SODIUM-DEXTROSE 2-4 GM/100ML-% IV SOLN
2.0000 g | INTRAVENOUS | Status: AC
  Administered 2022-08-11: 2 g via INTRAVENOUS
  Filled 2022-08-11: qty 100

## 2022-08-11 MED ORDER — THROMBIN 20000 UNITS EX SOLR
CUTANEOUS | Status: DC | PRN
  Administered 2022-08-11: 20 mL via TOPICAL

## 2022-08-11 MED ORDER — DEXAMETHASONE SODIUM PHOSPHATE 10 MG/ML IJ SOLN
INTRAMUSCULAR | Status: AC
  Filled 2022-08-11: qty 1

## 2022-08-11 MED ORDER — HYDROMORPHONE HCL 1 MG/ML IJ SOLN
INTRAMUSCULAR | Status: AC
  Filled 2022-08-11: qty 1

## 2022-08-11 MED ORDER — PROPOFOL 10 MG/ML IV BOLUS
INTRAVENOUS | Status: DC | PRN
  Administered 2022-08-11: 100 mg via INTRAVENOUS

## 2022-08-11 MED ORDER — LIDOCAINE 2% (20 MG/ML) 5 ML SYRINGE
INTRAMUSCULAR | Status: DC | PRN
  Administered 2022-08-11: 100 mg via INTRAVENOUS

## 2022-08-11 SURGICAL SUPPLY — 55 items
ADH SKN CLS APL DERMABOND .7 (GAUZE/BANDAGES/DRESSINGS) ×1
APL SKNCLS STERI-STRIP NONHPOA (GAUZE/BANDAGES/DRESSINGS) ×1
BAG COUNTER SPONGE SURGICOUNT (BAG) ×1 IMPLANT
BAG DECANTER FOR FLEXI CONT (MISCELLANEOUS) ×1 IMPLANT
BAG SPNG CNTER NS LX DISP (BAG) ×1
BAND INSRT 18 STRL LF DISP RB (MISCELLANEOUS) ×2
BAND RUBBER #18 3X1/16 STRL (MISCELLANEOUS) ×2 IMPLANT
BENZOIN TINCTURE PRP APPL 2/3 (GAUZE/BANDAGES/DRESSINGS) ×1 IMPLANT
BIT DRILL 13 (BIT) IMPLANT
BUR MATCHSTICK NEURO 3.0 LAGG (BURR) ×1 IMPLANT
CAGE PEEK 6X14X11 (Cage) ×3 IMPLANT
CANISTER SUCT 3000ML PPV (MISCELLANEOUS) ×1 IMPLANT
DERMABOND ADVANCED .7 DNX12 (GAUZE/BANDAGES/DRESSINGS) IMPLANT
DRAPE C-ARM 42X72 X-RAY (DRAPES) ×2 IMPLANT
DRAPE LAPAROTOMY 100X72 PEDS (DRAPES) ×1 IMPLANT
DRAPE MICROSCOPE SLANT 54X150 (MISCELLANEOUS) ×1 IMPLANT
DURAPREP 6ML APPLICATOR 50/CS (WOUND CARE) ×1 IMPLANT
ELECT COATED BLADE 2.86 ST (ELECTRODE) ×1 IMPLANT
ELECT REM PT RETURN 9FT ADLT (ELECTROSURGICAL) ×1
ELECTRODE REM PT RTRN 9FT ADLT (ELECTROSURGICAL) ×1 IMPLANT
GAUZE 4X4 16PLY ~~LOC~~+RFID DBL (SPONGE) IMPLANT
GAUZE SPONGE 4X4 12PLY STRL (GAUZE/BANDAGES/DRESSINGS) ×1 IMPLANT
GLOVE ECLIPSE 9.0 STRL (GLOVE) ×1 IMPLANT
GLOVE EXAM NITRILE XL STR (GLOVE) IMPLANT
GOWN STRL REUS W/ TWL LRG LVL3 (GOWN DISPOSABLE) IMPLANT
GOWN STRL REUS W/ TWL XL LVL3 (GOWN DISPOSABLE) IMPLANT
GOWN STRL REUS W/TWL 2XL LVL3 (GOWN DISPOSABLE) IMPLANT
GOWN STRL REUS W/TWL LRG LVL3 (GOWN DISPOSABLE)
GOWN STRL REUS W/TWL XL LVL3 (GOWN DISPOSABLE)
HALTER HD/CHIN CERV TRACTION D (MISCELLANEOUS) ×1 IMPLANT
HEMOSTAT POWDER KIT SURGIFOAM (HEMOSTASIS) ×1 IMPLANT
KIT BASIN OR (CUSTOM PROCEDURE TRAY) ×1 IMPLANT
KIT TURNOVER KIT B (KITS) ×1 IMPLANT
NDL SPNL 20GX3.5 QUINCKE YW (NEEDLE) ×1 IMPLANT
NEEDLE SPNL 20GX3.5 QUINCKE YW (NEEDLE) ×1 IMPLANT
NS IRRIG 1000ML POUR BTL (IV SOLUTION) ×1 IMPLANT
PACK LAMINECTOMY NEURO (CUSTOM PROCEDURE TRAY) ×1 IMPLANT
PAD ARMBOARD 7.5X6 YLW CONV (MISCELLANEOUS) ×3 IMPLANT
PLATE 3 57.5XLCK NS SPNE CVD (Plate) IMPLANT
PLATE 3 ATLANTIS TRANS (Plate) ×1 IMPLANT
SCREW 4.0X13 (Screw) ×8 IMPLANT
SCREW BN 13X4XSLF DRL FXANG (Screw) IMPLANT
SPACER SPNL 11X14X6XPEEK CVD (Cage) IMPLANT
SPCR SPNL 11X14X6XPEEK CVD (Cage) ×3 IMPLANT
SPONGE INTESTINAL PEANUT (DISPOSABLE) ×1 IMPLANT
SPONGE SURGIFOAM ABS GEL 100 (HEMOSTASIS) ×1 IMPLANT
STRIP CLOSURE SKIN 1/2X4 (GAUZE/BANDAGES/DRESSINGS) ×1 IMPLANT
SUT VIC AB 3-0 SH 8-18 (SUTURE) ×1 IMPLANT
SUT VIC AB 4-0 RB1 18 (SUTURE) ×1 IMPLANT
TAPE CLOTH 4X10 WHT NS (GAUZE/BANDAGES/DRESSINGS) ×1 IMPLANT
TAPE CLOTH SURG 4X10 WHT LF (GAUZE/BANDAGES/DRESSINGS) IMPLANT
TOWEL GREEN STERILE (TOWEL DISPOSABLE) ×1 IMPLANT
TOWEL GREEN STERILE FF (TOWEL DISPOSABLE) ×1 IMPLANT
TRAP SPECIMEN MUCUS 40CC (MISCELLANEOUS) ×1 IMPLANT
WATER STERILE IRR 1000ML POUR (IV SOLUTION) ×1 IMPLANT

## 2022-08-11 NOTE — Progress Notes (Signed)
Orthopedic Tech Progress Note Patient Details:  Kenneth [name] 01-Feb-1941 [id number]  RN stated "patient has soft collar"  Patient ID: Kenneth [name], male   DOB: 01-07-1941, 82 y.o.   MRN: [id number]  Kenneth [name] 08/11/2022, 3:59 PM

## 2022-08-11 NOTE — Op Note (Signed)
Date of procedure: 08/11/2022  Date of dictation: Same  Service: Neurosurgery  Preoperative diagnosis: Cervical stenosis with myelopathy/radiculopathy  Postoperative diagnosis: Same  Procedure Name: C4-5, C5-6, C6-7 anterior cervical discectomy with interbody fusion utilizing interbody cages, local harvested autograft, and anterior plate instrumentation.  Surgeon:Dashel Goines A.Kaiyan Luczak, M.D.  Asst. Surgeon: Reinaldo Meeker, NP  Anesthesia: General  Indication: Patient is an 82 year old male with severe neck and medial scapular pain on the right side with some radiation to the right shoulder and upper extremity which is failed conservative management.  Workup demonstrates evidence of extremely severe cervical disc degeneration with associated spondylosis and significant spinal and foraminal stenosis at C4-5 C5-6 and C6-7.  Patient has failed all efforts of conservative management presents now for 3 level anterior cervical decompression and fusion in hopes of improving his symptoms.  Operative note: After induction of anesthesia, patient positioned supine with his neck slightly extended and placed in halter traction.  Patient's neck was prepped and draped sterilely.  Incision was made overlying C5-6.  Dissection performed on the right side.  Retractor placed.  Fluoroscopy used.  Levels confirmed.  Disc bases at C4-5, C5-6 and C6-7 were identified.  Anterior osteophytes were removed.  Disc bases were incised.  Disc were removed using various instruments down to the level of the posterior annulus.  The microscope was then brought into the field used throughout the remainder of the discectomy.  Remaining aspects of osteophytes and annulus were removed down to the level of the posterior logical ligament.  Posterior logical was then elevated and resected in a piecemeal fashion.  Underlying thecal sac was identified.  A wide central decompression was then performed undercutting the bodies of C4 and C5.  Decompression then  proceeded to each neural foramina.  Wide anterior foraminotomies performed along the course exiting C5 nerve roots bilaterally.  At this point a very thorough decompression had been achieved.  There was no evidence of injury to the thecal sac or nerve roots.  Gelfoam was placed topically.  The procedure was then repeated at C5-6 and C6-7 again without complications.  Wounds were irrigated.  Gelfoam was removed.  6 mm [employer] anatomic peek cages were packed with locally harvested autograft and impacted in place.  Each cage was recessed slightly from the anterior cortical margin.  Medtronic Atlantis translational plate was then placed over the C4, C5, C6 and C7 levels.  This then attached under fluoroscopic guidance using 13 mm fixed angle screws to each in all 4 levels.  All screws again the final tightening and found to be solidly within the bone.  Locking screws were engaged at all levels.  Wound was irrigated.  Hemostasis was again confirmed.  Wound was then closed in layers with Vicryl sutures.  Steri-Strips and sterile dressing were applied.  No apparent complications.  Patient tolerated the procedure well and he returns to the recovery room postop.

## 2022-08-11 NOTE — H&P (Signed)
Kenneth [name] is an 82 y.o. male.   Chief Complaint: Neck pain HPI: 82 year old male with history of severe right-sided neck and scapular pain with some radiation to his right upper extremity.  Patient's pain is severe and unremitting.  He has failed conservative management.  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  Patient presents now for 3 level anterior cervical decompression and fusion surgery in hopes of improving his symptoms.  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   Diverticulosis of colon (without mention of hemorrhage)    GERD (gastroesophageal reflux disease)    Hiatal hernia    HLD (hyperlipidemia)    Internal hemorrhoids without mention of complication    Irritable bowel syndrome    Other specified gastritis without mention of hemorrhage    Prostate cancer ([place]) 2022   Pulmonary fibrosis ([place])    Stress fracture 02/2021    Past Surgical History:  Procedure Laterality Date   CARPAL TUNNEL RELEASE     left   CATARACT EXTRACTION  06/2011   bilateral   DEEP NECK LYMPH NODE BIOPSY / EXCISION  03/25/2021   INGUINAL HERNIA REPAIR Right 09/05/2019   Procedure: OPEN REPAIR RIGHT INGUINAL HERNIA WITH MESH;  Surgeon: Armandina Gemma, MD;  Location: [facility];  Service: General;  Laterality: Right;   [place]   x 2   LUMBAR LAMINECTOMY/DECOMPRESSION MICRODISCECTOMY Left 07/11/2013   Procedure: LUMBAR ONE TO TWO, LUMBAR TWO TO THREE, LUMBAR THREE TO FOUR LUMBAR LAMINECTOMY/DECOMPRESSION MICRODISCECTOMY 3 LEVELS;  Surgeon: Charlie Pitter, MD;  Location: [facility];  Service: Neurosurgery;  Laterality: Left;   NASAL SINUS SURGERY     RIGHT/LEFT HEART CATH AND CORONARY ANGIOGRAPHY N/A 04/14/2022   Procedure:  RIGHT/LEFT HEART CATH AND CORONARY ANGIOGRAPHY;  Surgeon: Larey Dresser, MD;  Location: [facility];  Service: Cardiovascular;  Laterality: N/A;    Family History  Problem Relation Age of Onset   Heart failure Father 70   Bladder Cancer Mother    Breast cancer Mother    Colon cancer Neg Hx    Pancreatic cancer Neg Hx    Rectal cancer Neg Hx    Stomach cancer Neg Hx    Prostate cancer Neg Hx    Social History:  reports that he has never smoked. He has never used smokeless tobacco. He reports that he does not currently use alcohol. He reports that he does not use drugs.  Allergies: No Known Allergies  Medications Prior to Admission  Medication Sig Dispense Refill   acetaminophen (TYLENOL) 500 MG tablet Take 1,000 mg by mouth every 6 (six) hours as needed for moderate pain.     acetaminophen-codeine (TYLENOL #3) 300-30 MG per tablet Take 1 tablet by mouth every 8 (eight) hours as needed for moderate pain.     aspirin EC 81 MG tablet Take 81 mg by mouth every Monday, Wednesday, and Friday.     Bempedoic Acid (NEXLETOL) 180 MG TABS Take 180 mg by mouth daily.     bismuth subsalicylate (PEPTO BISMOL) 262 MG/15ML suspension Take 30 mLs by mouth every 6 (six) hours as needed for diarrhea or loose stools or indigestion.     carboxymethylcellulose (REFRESH TEARS) 0.5 % SOLN Place  1 drop into both eyes daily as needed (dry eyes).     carisoprodol (SOMA) 350 MG tablet Take 175 mg by mouth 3 (three) times daily as needed for muscle spasms.     Cholecalciferol (VITAMIN D-3) 1000 UNITS CAPS Take 2,000 Units by mouth every evening.     CO-ENZYME Q-10 PO Take 1 tablet by mouth every evening.      Cyanocobalamin (VITAMIN B12) 1000 MCG TBCR Take 1,000 mcg by mouth daily.     desvenlafaxine (PRISTIQ) 50 MG 24 hr tablet Take 50 mg by mouth daily.     diclofenac (VOLTAREN) 75 MG EC tablet Take 75 mg by mouth 2 (two) times daily as needed (pain.).  2   diclofenac sodium (VOLTAREN) 1 % GEL Apply 4  g topically 4 (four) times daily as needed (pain.).  12   dicyclomine (BENTYL) 10 MG capsule Take one by mouth every 6 hours as needed for abdominal cramping 30 capsule 1   diphenoxylate-atropine (LOMOTIL) 2.5-0.025 MG tablet Take 1 tablet by mouth 4 (four) times daily as needed for diarrhea or loose stools.     Emollient (AQUAPHOR ADV PROTECT HEALING EX) Apply 1 Application topically daily as needed (itching).     Fluticasone Propionate (XHANCE) 93 MCG/ACT EXHU Place 1 spray into both nostrils 2 (two) times daily as needed (sinus).     Glucosamine Sulfate 500 MG TABS Take 500 mg by mouth daily.     HYDROcodone-acetaminophen (NORCO) 10-325 MG tablet Take 1 tablet by mouth every 4 (four) hours as needed for moderate pain.     loratadine (CLARITIN) 10 MG tablet Take 10 mg by mouth daily.     meclizine (ANTIVERT) 25 MG tablet Take 25 mg by mouth 3 (three) times daily as needed for dizziness.      methocarbamol (ROBAXIN) 500 MG tablet Take 500 mg by mouth daily as needed (severe muscle spasms).     Multiple Vitamin (MULTIVITAMIN) tablet Take 1 tablet by mouth daily.     omeprazole (PRILOSEC) 40 MG capsule Take 1 capsule (40 mg total) by mouth daily. (Patient taking differently: Take 40 mg by mouth daily as needed (acid reflux).) 30 capsule 11   ondansetron (ZOFRAN) 8 MG tablet TAKE 1 TABLET (8 MG TOTAL) BY MOUTH EVERY 8 (EIGHT) HOURS AS NEEDED FOR NAUSEA OR VOMITING. START 72 HOURS AFTER IV CHEMOTHERAPY ADMINISTRATION 30 tablet 1   oxybutynin (DITROPAN) 5 MG tablet Take 5 mg by mouth at bedtime.     Pirfenidone (ESBRIET) 801 MG TABS Take 1 tablet by mouth 3 (three) times daily with meals. 270 tablet 1   Probiotic Product (ALIGN) 4 MG CAPS Take 4 mg by mouth daily.     prochlorperazine (COMPAZINE) 10 MG tablet Take 1 tablet (10 mg total) by mouth every 6 (six) hours as needed for nausea. 60 tablet 1   denosumab (PROLIA) 60 MG/ML SOSY injection Inject 60 mg into the skin every 6 (six) months. At [place]      Results for orders placed or performed during the hospital encounter of 08/11/22 (from the past 48 hour(s))  ABO/Rh     Status: None   Collection Time: 08/11/22  6:43 AM  Result Value Ref Range   ABO/RH(D)      A NEG Performed at [facility] [address], [place], [place] [address]    No results found.  Pertinent items noted in HPI and remainder of comprehensive ROS otherwise negative.  Blood pressure Marland Kitchen)  145/90, pulse (!) 54, temperature 97.9 F (36.6 C), temperature source Oral, resp. rate 18, height '5\' 9"'$  (1.753 m), weight 74.8 kg, SpO2 96 %.  Patient is awake and alert.  He is oriented and appropriate.  Speech is fluent.  Judgment insight are intact.  Cranial nerve function normal bilateral.  Motor examination reveals intact motor strength bilateral.  Sensory examination some decrease sensation pinprick light touch in his left C7 and C8 dermatomes.  Deep tendon rhexis is normal active.  No evidence of long track signs.  Gait reasonably normal.  Posture normal.  Examination head ears eyes nose and throat is unremarked.  Chest and abdomen are benign.  Extremities are free of major deformity. Assessment/Plan C4-5, C5-6, C6-7 spondylosis with stenosis and radiculopathy.  Plan C4-5, C5-6, C6-7 anterior cervical discectomy with interbody fusion utilizing interbody cages, local harvested autograft, and anterior plate is mentation.  Risks and benefits been explained.  Patient wishes to proceed.  Cooper Render Myiah Petkus 08/11/2022, 7:55 AM

## 2022-08-11 NOTE — Brief Op Note (Signed)
08/11/2022  10:48 AM  PATIENT:  Kenneth [name]  82 y.o. male  PRE-OPERATIVE DIAGNOSIS:  Stenosis  POST-OPERATIVE DIAGNOSIS:  Stenosis  PROCEDURE:  Procedure(s): Anterior Cervical Decompression/Discectomy Fusion - Cervical Four-Cervical Five -  Cervical Five-Cervical Six - Cerivcal Six-Cerival Seven (N/A)  SURGEON:  Surgeon(s) and Role:    * Earnie Larsson, MD - Primary  PHYSICIAN ASSISTANT:   ASSISTANTSMearl Latin   ANESTHESIA:   general  EBL:  100 mL   BLOOD ADMINISTERED:none  DRAINS: none   LOCAL MEDICATIONS USED:  NONE  SPECIMEN:  No Specimen  DISPOSITION OF SPECIMEN:  N/A  COUNTS:  YES  TOURNIQUET:  * No tourniquets in log *  DICTATION: .Dragon Dictation  PLAN OF CARE: Admit to inpatient   PATIENT DISPOSITION:  PACU - hemodynamically stable.   Delay start of Pharmacological VTE agent (>24hrs) due to surgical blood loss or risk of bleeding: yes

## 2022-08-11 NOTE — Anesthesia Postprocedure Evaluation (Signed)
Anesthesia Post Note  Patient: Kenneth [name]  Procedure(s) Performed: Anterior Cervical Decompression/Discectomy Fusion - Cervical Four-Cervical Five -  Cervical Five-Cervical Six - Cerivcal Six-Cerival Seven     Patient location during evaluation: PACU Anesthesia Type: General Level of consciousness: awake and alert and oriented Pain management: pain level controlled Vital Signs Assessment: post-procedure vital signs reviewed and stable Respiratory status: spontaneous breathing, nonlabored ventilation and respiratory function stable Cardiovascular status: blood pressure returned to baseline and stable Postop Assessment: no apparent nausea or vomiting Anesthetic complications: no   No notable events documented.  Last Vitals:  Vitals:   08/11/22 1145 08/11/22 1200  BP: 134/75 122/72  Pulse: 87 87  Resp: 12 13  Temp:    SpO2: 95% 95%    Last Pain:  Vitals:   08/11/22 1130  TempSrc:   PainSc: 0-No pain    LLE Motor Response: Purposeful movement (08/11/22 1200) LLE Sensation: Full sensation (08/11/22 1200) RLE Motor Response: Purposeful movement (08/11/22 1200) RLE Sensation: Full sensation (08/11/22 1200)      Romuald Mccaslin A.

## 2022-08-11 NOTE — Transfer of Care (Signed)
Immediate Anesthesia Transfer of Care Note  Patient: Kenneth [name]  Procedure(s) Performed: Anterior Cervical Decompression/Discectomy Fusion - Cervical Four-Cervical Five -  Cervical Five-Cervical Six - Cerivcal Six-Cerival Seven  Patient Location: PACU  Anesthesia Type:General  Level of Consciousness: awake, alert , oriented and patient cooperative  Airway & Oxygen Therapy: Patient Spontanous Breathing and Patient connected to nasal cannula oxygen  Post-op Assessment: Report given to RN, Post -op Vital signs reviewed and stable and Patient moving all extremities X 4  Post vital signs: Reviewed and stable  Last Vitals:  Vitals Value Taken Time  BP 143/78 08/11/22 1104  Temp    Pulse 89 08/11/22 1106  Resp 9 08/11/22 1106  SpO2 96 % 08/11/22 1106  Vitals shown include unvalidated device data.  Last Pain:  Vitals:   08/11/22 0643  TempSrc:   PainSc: 6          Complications: No notable events documented.

## 2022-08-11 NOTE — Anesthesia Procedure Notes (Signed)
Procedure Name: Intubation Date/Time: 08/11/2022 8:22 AM  Performed by: Annamary Carolin, CRNAPre-anesthesia Checklist: Patient identified, Emergency Drugs available, Suction available and Patient being monitored Patient Re-evaluated:Patient Re-evaluated prior to induction Oxygen Delivery Method: Circle System Utilized Preoxygenation: Pre-oxygenation with 100% oxygen Induction Type: IV induction Ventilation: Mask ventilation without difficulty Laryngoscope Size: Glidescope and 3 Grade View: Grade I Tube type: Oral Number of attempts: 1 Airway Equipment and Method: Stylet Placement Confirmation: ETT inserted through vocal cords under direct vision, positive ETCO2 and breath sounds checked- equal and bilateral Secured at: 22.5 cm Tube secured with: Tape Dental Injury: Teeth and Oropharynx as per pre-operative assessment  Comments: With ease x1

## 2022-08-12 MED ORDER — HYDROCODONE-ACETAMINOPHEN 5-325 MG PO TABS: 1.0000 | ORAL_TABLET | ORAL | 0 refills | Status: DC | PRN

## 2022-08-12 MED ORDER — POLYETHYLENE GLYCOL 3350 17 G PO PACK
17.0000 g | PACK | Freq: Every day | ORAL | Status: DC
  Administered 2022-08-12: 17 g via ORAL
  Filled 2022-08-12: qty 1

## 2022-08-12 NOTE — Progress Notes (Signed)
  Report called to receiving Nurse at [facility]. Discharge instructions information placed in the packet and report called in to receiving Nurse. Patient alert and oriented, mae's well, voiding adequate amount of urine, swallowing without difficulty, no c/o pain at time of discharge. Patient discharged to rehab with family. Script and discharged instructions given to patient. Patient and family stated understanding of instructions given. Patient has an appointment with Dr. Annette Stable in 2 weeks

## 2022-08-12 NOTE — Evaluation (Signed)
Occupational Therapy Evaluation Patient Details Name: Kenneth [name] MRN: [id number] DOB: [birth year] Today's Date: 08/12/2022   History of Present Illness Kenneth [name]  82 y.o. male who underwent ACDF C4-C7 on 08/11/22. PMH significant for depression, prostate cancer.   Clinical Impression   Pt was evaluated s/p the above cervical surgery, he is typically indep at baseline and lives with his wife at [facility]; per pt report he is planing to go to rehab at [facility] for a  few days prior to going home. Upon evaluation he was limited by cervical pain, precautions and compensatory techniques. Overall he needed cues to maintain cervical precautions, min G for mobility and up to min A for Adls. Recommend RW or SPC use at d/c.      Recommendations for follow up therapy are one component of a multi-disciplinary discharge planning process, led by the attending physician.  Recommendations may be updated based on patient status, additional functional criteria and insurance authorization.   Follow Up Recommendations  Other (comment) (Pt plannin to go to rehab at [facility])     Assistance Recommended at Discharge Intermittent Supervision/Assistance  Patient can return home with the following A little help with walking and/or transfers;A little help with bathing/dressing/bathroom;Assistance with cooking/housework;Assistance with feeding;Direct supervision/assist for medications management;Direct supervision/assist for financial management;Assist for transportation;Help with stairs or ramp for entrance    Functional Status Assessment  Patient has had a recent decline in their functional status and demonstrates the ability to make significant improvements in function in a reasonable and predictable amount of time.  Equipment Recommendations  Other (comment) (RW vs SPC)    Recommendations for Other Services       Precautions / Restrictions Precautions Precautions: Cervical;Fall Precaution  Booklet Issued: Yes (comment) Precaution Comments: Verbally reviewed handout and pt was cued for precautions during functional mobility. Required Braces or Orthoses: Cervical Brace Cervical Brace: Soft collar Restrictions Weight Bearing Restrictions: No      Mobility Bed Mobility               General bed mobility comments: sitting EOB upon arrival    Transfers Overall transfer level: Needs assistance Equipment used: None Transfers: Sit to/from Stand Sit to Stand: Min guard                  Balance Overall balance assessment: Needs assistance Sitting-balance support: Feet supported Sitting balance-Leahy Scale: Good     Standing balance support: Single extremity supported, During functional activity Standing balance-Leahy Scale: Fair                             ADL either performed or assessed with clinical judgement   ADL Overall ADL's : Needs assistance/impaired Eating/Feeding: Independent;Sitting   Grooming: Min guard;Standing Grooming Details (indicate cue type and reason): cues given Upper Body Bathing: Supervision/ safety;Sitting   Lower Body Bathing: Sit to/from stand;Min guard;Cueing for back precautions;Cueing for compensatory techniques;Cueing for safety Lower Body Bathing Details (indicate cue type and reason): does well with figure 4 Upper Body Dressing : Set up;Sitting   Lower Body Dressing: Supervision/safety;Sit to/from stand   Toilet Transfer: Min guard;Ambulation   Toileting- Clothing Manipulation and Hygiene: Supervision/safety;Sitting/lateral lean       Functional mobility during ADLs: Supervision/safety General ADL Comments: cues to maintain cervical precautions     Vision Baseline Vision/History: 1 Wears glasses Vision Assessment?: No apparent visual deficits     Perception Perception Perception Tested?: No  Praxis Praxis Praxis tested?: Not tested    Pertinent Vitals/Pain       Hand Dominance Right    Extremity/Trunk Assessment Upper Extremity Assessment Upper Extremity Assessment: Defer to OT evaluation   Lower Extremity Assessment Lower Extremity Assessment: Generalized weakness   Cervical / Trunk Assessment Cervical / Trunk Assessment: Neck Surgery   Communication Communication Communication: No difficulties (verbose, tangential at times)   Cognition Arousal/Alertness: Awake/alert Behavior During Therapy: WFL for tasks assessed/performed Overall Cognitive Status: Within Functional Limits for tasks assessed                                       General Comments  VSS on RA    Exercises     Shoulder Instructions      Home Living Family/patient expects to be discharged to:: Private residence ([place]) Living Arrangements: Spouse/significant other Available Help at Discharge: Family;Available 24 hours/day Type of Home: House [facility]") Home Access: Level entry     Home Layout: One level     Bathroom Shower/Tub: Occupational psychologist: Handicapped height Bathroom Accessibility: Yes How Accessible: Accessible via wheelchair;Accessible via walker Home Equipment: Grab bars - toilet;Grab bars - tub/shower   Additional Comments: Pt planning to spend a few nights at [facility] facility [facility] prior to d/c home with wife      Prior Functioning/Environment Prior Level of Function : Independent/Modified Independent;Driving             Mobility Comments: no AD ADLs Comments: retired Stage manager        OT Problem List: Decreased strength;Decreased range of motion;Decreased activity tolerance;Impaired balance (sitting and/or standing);Decreased safety awareness;Decreased knowledge of precautions;Decreased knowledge of use of DME or AE;Impaired UE functional use      OT Treatment/Interventions:      OT Goals(Current goals can be found in the care plan section) Acute Rehab OT Goals Patient Stated Goal: home OT Goal  Formulation: With patient Time For Goal Achievement: 08/26/22 Potential to Achieve Goals: Good  OT Frequency:      Co-evaluation              AM-PAC OT "6 Clicks" Daily Activity     Outcome Measure Help from another person eating meals?: None Help from another person taking care of personal grooming?: A Little Help from another person toileting, which includes using toliet, bedpan, or urinal?: A Little Help from another person bathing (including washing, rinsing, drying)?: A Little Help from another person to put on and taking off regular upper body clothing?: A Little Help from another person to put on and taking off regular lower body clothing?: A Little 6 Click Score: 19   End of Session Equipment Utilized During Treatment: Cervical collar Nurse Communication: Mobility status  Activity Tolerance: Patient tolerated treatment well Patient left: in bed;with call bell/phone within reach  OT Visit Diagnosis: Unsteadiness on feet (R26.81);Other abnormalities of gait and mobility (R26.89);Muscle weakness (generalized) (M62.81);Pain                Time: 5462-7035 OT Time Calculation (min): 18 min Charges:  OT General Charges $OT Visit: 1 Visit OT Evaluation $OT Eval Moderate Complexity: 1 Mod  Shade Flood, OTR/L Acute Rehabilitation Services Office [phone] Secure Chat Communication Preferred   Elliot Cousin 08/12/2022, 11:44 AM

## 2022-08-12 NOTE — NC FL2 (Addendum)
[place] LEVEL OF CARE FORM     IDENTIFICATION  Patient Name: Kenneth [name] Birthdate: 17-Aug-1940 Sex: male Admission Date (Current Location): 08/11/2022  [facility] and [place] Number:  Herbalist and Address:  [facility] [place], [place] [address], [place], [place] [address]      Provider Number: [id number]  Attending Physician Name and Address:  Earnie Larsson, MD  Relative Name and Phone Number:       Current Level of Care: Hospital Recommended Level of Care: [place] Prior Approval Number:    Date Approved/Denied:   PASRR Number:   [id number]  Discharge Plan: SNF    Current Diagnoses: Patient Active Problem List   Diagnosis Date Noted   Cervical spondylosis with myelopathy and radiculopathy 08/11/2022   Chronic sinusitis 01/26/2022   Eustachian tube dysfunction, left 01/26/2022   Diffuse large B cell lymphoma ([place]) 06/17/2021   Malignant neoplasm of prostate ([place]) 06/20/2020   Inguinal hernia of right side without obstruction or gangrene 08/30/2019   Drug-induced weight loss 06/20/2018   ILD (interstitial lung disease) ([place]) 06/20/2018   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[place]) 03/23/2017   Lumbosacral spondylosis without myelopathy 07/11/2013   Lumbar stenosis with neurogenic claudication 07/11/2013    Orientation RESPIRATION BLADDER Height & Weight     Self, Time, Situation, Place  Normal Continent Weight: 74.8 kg Height:  5' 9"$  (175.3 cm)  BEHAVIORAL SYMPTOMS/MOOD NEUROLOGICAL BOWEL NUTRITION STATUS      Continent Diet (Regular/ thin liquids)  AMBULATORY STATUS COMMUNICATION OF NEEDS Skin   Limited Assist Verbally Surgical wounds (dressing to neck)                       Personal Care Assistance Level of Assistance  Bathing, Feeding, Dressing Bathing Assistance: Limited assistance Feeding assistance: Independent Dressing Assistance: Limited assistance     Functional Limitations Info  Sight,  Hearing, Speech Sight Info: Adequate Hearing Info: Adequate Speech Info: Adequate    SPECIAL CARE FACTORS FREQUENCY  PT (By licensed PT), OT (By licensed OT)     PT Frequency: 5x/wk OT Frequency: 5x/wk            Contractures Contractures Info: Not present    Additional Factors Info  Code Status, Allergies, Psychotropic Code Status Info: Full Allergies Info: NKA Psychotropic Info: Effexor XR 75 mg daily         Current Medications (08/12/2022):  This is the current hospital active medication list Current Facility-Administered Medications  Medication Dose Route Frequency Provider Last Rate Last Admin   0.9 %  sodium chloride infusion  250 mL Intravenous Continuous Earnie Larsson, MD   Stopped at 08/11/22 2218   acetaminophen (TYLENOL) tablet 650 mg  650 mg Oral Q4H PRN Earnie Larsson, MD       Or   acetaminophen (TYLENOL) suppository 650 mg  650 mg Rectal Q4H PRN Earnie Larsson, MD       acidophilus (RISAQUAD) capsule 1 capsule  1 capsule Oral Daily Pool, Mallie Mussel, MD   1 capsule at 08/11/22 1343   alum & mag hydroxide-simeth (MAALOX/MYLANTA) 200-200-20 MG/5ML suspension 30 mL  30 mL Oral Q6H PRN Earnie Larsson, MD   30 mL at 08/11/22 2231   aspirin EC tablet 81 mg  81 mg Oral Q M,W,F Pool, Mallie Mussel, MD       Bempedoic Acid TABS 180 mg  180 mg Oral Daily Earnie Larsson, MD       cholecalciferol (  VITAMIN D3) 25 MCG (1000 UNIT) tablet 2,000 Units  2,000 Units Oral QPM Earnie Larsson, MD       cyanocobalamin (VITAMIN B12) tablet 1,000 mcg  1,000 mcg Oral Daily Earnie Larsson, MD   1,000 mcg at 08/11/22 1343   dexamethasone (DECADRON) injection 4 mg  4 mg Intravenous Q6H Earnie Larsson, MD       Or   dexamethasone (DECADRON) tablet 4 mg  4 mg Oral Q6H Earnie Larsson, MD   4 mg at 08/12/22 0601   diclofenac (VOLTAREN) EC tablet 75 mg  75 mg Oral BID PRN Earnie Larsson, MD       HYDROcodone-acetaminophen (NORCO) 10-325 MG per tablet 1-2 tablet  1-2 tablet Oral Q4H PRN Earnie Larsson, MD   1 tablet at 08/12/22 0342    HYDROcodone-acetaminophen (NORCO/VICODIN) 5-325 MG per tablet 1 tablet  1 tablet Oral Q4H PRN Earnie Larsson, MD       HYDROmorphone (DILAUDID) injection 1 mg  1 mg Intravenous Q2H PRN Earnie Larsson, MD       loratadine (CLARITIN) tablet 10 mg  10 mg Oral Daily Pool, Mallie Mussel, MD       menthol-cetylpyridinium (CEPACOL) lozenge 3 mg  1 lozenge Oral PRN Earnie Larsson, MD       Or   phenol (CHLORASEPTIC) mouth spray 1 spray  1 spray Mouth/Throat PRN Earnie Larsson, MD       methocarbamol (ROBAXIN) tablet 500 mg  500 mg Oral Q6H PRN Earnie Larsson, MD   500 mg at 08/12/22 [id number]   Or   methocarbamol (ROBAXIN) 500 mg in dextrose 5 % 50 mL IVPB  500 mg Intravenous Q6H PRN Earnie Larsson, MD       multivitamin with minerals tablet 1 tablet  1 tablet Oral Daily Pool, Mallie Mussel, MD       ondansetron [facility]) tablet 4 mg  4 mg Oral Q6H PRN Earnie Larsson, MD       Or   ondansetron [facility]) injection 4 mg  4 mg Intravenous Q6H PRN Earnie Larsson, MD       oxybutynin (DITROPAN) tablet 5 mg  5 mg Oral QHS Earnie Larsson, MD       pantoprazole (PROTONIX) EC tablet 40 mg  40 mg Oral Daily Earnie Larsson, MD       Pirfenidone TABS 801 mg  1 tablet Oral TID WC Earnie Larsson, MD       polyethylene glycol (MIRALAX / GLYCOLAX) packet 17 g  17 g Oral Daily Earnie Larsson, MD   17 g at 08/12/22 1000   sodium chloride flush (NS) 0.9 % injection 3 mL  3 mL Intravenous Q12H Earnie Larsson, MD   3 mL at 08/11/22 2136   sodium chloride flush (NS) 0.9 % injection 3 mL  3 mL Intravenous PRN Earnie Larsson, MD       venlafaxine XR (EFFEXOR-XR) 24 hr capsule 75 mg  75 mg Oral Q breakfast Earnie Larsson, MD         Discharge Medications: Please see discharge summary for a list of discharge medications.  Relevant Imaging Results:  Relevant Lab Results:   Additional Information SS#: [id number]  Pollie Friar, RN

## 2022-08-12 NOTE — TOC Transition Note (Signed)
Transition of Care [facility]) - CM/SW Discharge Note   Patient Details  Name: Kenneth [name] MRN: [id number] Date of Birth: 01/16/1941  [facility] CM/SW Contact:  Pollie Friar, RN Phone Number: 08/12/2022, 10:14 AM   Clinical Narrative:   Marland Kitchen  Pt is discharging to [facility] SNF for rehab. CM has verified bed availability with Butch Penny at [facility]. Required information sent via [place].  Wife to provide transportation to [employer]   Number for report: [phone]   Final next level of care: Skilled Nursing Facility Barriers to Discharge: No Barriers Identified   Patient Goals and CMS Choice CMS Medicare.gov Compare Post Acute Care list provided to:: Patient    Discharge Placement                Patient chooses bed at: [facility] Patient to be transferred to facility by: wife Name of family member notified: wife at the bedside Patient and family notified of of transfer: 08/12/22  Discharge Plan and Services Additional resources added to the After Visit Summary for                                       Social Determinants of Health (SDOH) Interventions SDOH Screenings   Tobacco Use: Low Risk  (08/11/2022)     Readmission Risk Interventions     No data to display

## 2022-08-12 NOTE — Discharge Instructions (Addendum)
Wound Care Keep incision covered and dry for three days.  Do not put any creams, lotions, or ointments on incision. Leave steri-strips on neck.  They will fall off by themselves. Activity Walk each and every day, increasing distance each day. No lifting greater than 8 lbs.  Avoid excessive neck motion. No driving for 2 weeks; may ride as a passenger locally.  Diet Resume your normal diet.   Call Your Doctor If Any of These Occur Redness, drainage, or swelling at the wound.  Temperature greater than 101 degrees. Severe pain not relieved by pain medication. Incision starts to come apart. Follow Up Appt Call [phone])  for problems.  If you have any hardware placed in your spine, you will need an x-ray before your appointment.

## 2022-08-12 NOTE — Discharge Summary (Addendum)
Physician Discharge Summary  Patient ID: Kenneth [name] MRN: [id number] DOB/AGE: 01-06-1941 82 y.o.  Admit date: 08/11/2022 Discharge date: 08/12/2022  Admission Diagnoses:  Discharge Diagnoses:  Principal Problem:   Cervical spondylosis with myelopathy and radiculopathy   Discharged Condition: good  Hospital Course: Patient admitted to the hospital where he underwent uncomplicated three-level anterior cervical decompression and fusion surgery.  Postoperatively he is doing very well.  His preoperative severe neck pain and right upper extremity symptoms have resolved.  He is having minimal pain.  He is standing ambulating and voiding without difficulty.  He feels ready for discharge to [place].  Consults:   Significant Diagnostic Studies:   Treatments:   Discharge Exam: Blood pressure (!) 153/79, pulse 94, temperature 98.4 F (36.9 C), temperature source Oral, resp. rate 16, height 5' 9"$  (1.753 m), weight 74.8 kg, SpO2 99 %. Awake and alert.  Oriented and appropriate.  Speech is fluent.  Judgment insight are intact.  Cranial nerve function normal bilateral.  Motor 5/5 bilateral.  Sensory examination normal.  Wound clean and dry.  Neck soft.  Voice strong.  Chest and abdomen benign.  Disposition: Discharge disposition: 03-Skilled Nursing Facility        Allergies as of 08/12/2022   No Known Allergies      Medication List     STOP taking these medications    HYDROcodone-acetaminophen 10-325 MG tablet Commonly known as: NORCO Replaced by: HYDROcodone-acetaminophen 5-325 MG tablet       TAKE these medications    acetaminophen 500 MG tablet Commonly known as: TYLENOL Take 1,000 mg by mouth every 6 (six) hours as needed for moderate pain.   acetaminophen-codeine 300-30 MG tablet Commonly known as: TYLENOL #3 Take 1 tablet by mouth every 8 (eight) hours as needed for moderate pain.   Align 4 MG Caps Take 4 mg by mouth daily.   AQUAPHOR ADV PROTECT HEALING  EX Apply 1 Application topically daily as needed (itching).   aspirin EC 81 MG tablet Take 81 mg by mouth every Monday, Wednesday, and Friday.   bismuth subsalicylate [id number] suspension Commonly known as: PEPTO BISMOL Take 30 mLs by mouth every 6 (six) hours as needed for diarrhea or loose stools or indigestion.   carisoprodol 350 MG tablet Commonly known as: SOMA Take 175 mg by mouth 3 (three) times daily as needed for muscle spasms.   CO-ENZYME Q-10 PO Take 1 tablet by mouth every evening.   denosumab 60 MG/ML Sosy injection Commonly known as: PROLIA Inject 60 mg into the skin every 6 (six) months. At [place]   desvenlafaxine 50 MG 24 hr tablet Commonly known as: PRISTIQ Take 50 mg by mouth daily.   diclofenac 75 MG EC tablet Commonly known as: VOLTAREN Take 75 mg by mouth 2 (two) times daily as needed (pain.).   diclofenac sodium 1 % Gel Commonly known as: VOLTAREN Apply 4 g topically 4 (four) times daily as needed (pain.).   dicyclomine 10 MG capsule Commonly known as: BENTYL Take one by mouth every 6 hours as needed for abdominal cramping   diphenoxylate-atropine 2.5-0.025 MG tablet Commonly known as: LOMOTIL Take 1 tablet by mouth 4 (four) times daily as needed for diarrhea or loose stools.   Glucosamine Sulfate 500 MG Tabs Take 500 mg by mouth daily.   HYDROcodone-acetaminophen 5-325 MG tablet Commonly known as: NORCO/VICODIN Take 1 tablet by mouth every 4 (four) hours as needed for moderate pain ((score 4 to 6)). Replaces: HYDROcodone-acetaminophen 10-325 MG  tablet   loratadine 10 MG tablet Commonly known as: CLARITIN Take 10 mg by mouth daily.   meclizine 25 MG tablet Commonly known as: ANTIVERT Take 25 mg by mouth 3 (three) times daily as needed for dizziness.   methocarbamol 500 MG tablet Commonly known as: ROBAXIN Take 500 mg by mouth daily as needed (severe muscle spasms).   multivitamin tablet Take 1 tablet by mouth daily.    Nexletol 180 MG Tabs Generic drug: Bempedoic Acid Take 180 mg by mouth daily.   omeprazole 40 MG capsule Commonly known as: PRILOSEC Take 1 capsule (40 mg total) by mouth daily. What changed:  when to take this reasons to take this   ondansetron 8 MG tablet Commonly known as: ZOFRAN TAKE 1 TABLET (8 MG TOTAL) BY MOUTH EVERY 8 (EIGHT) HOURS AS NEEDED FOR NAUSEA OR VOMITING. START 72 HOURS AFTER IV CHEMOTHERAPY ADMINISTRATION   oxybutynin 5 MG tablet Commonly known as: DITROPAN Take 5 mg by mouth at bedtime.   Pirfenidone 801 MG Tabs Commonly known as: Esbriet Take 1 tablet by mouth 3 (three) times daily with meals.   prochlorperazine 10 MG tablet Commonly known as: COMPAZINE Take 1 tablet (10 mg total) by mouth every 6 (six) hours as needed for nausea.   Refresh Tears 0.5 % Soln Generic drug: carboxymethylcellulose Place 1 drop into both eyes daily as needed (dry eyes).   Vitamin B12 1000 MCG Tbcr Take 1,000 mcg by mouth daily.   Vitamin D-3 25 MCG (1000 UT) Caps Take 2,000 Units by mouth every evening.   Xhance 93 MCG/ACT Exhu Generic drug: Fluticasone Propionate Place 1 spray into both nostrils 2 (two) times daily as needed (sinus).         Signed: Cooper Render Pool 08/12/2022, 8:16 AM

## 2022-08-13 ENCOUNTER — Encounter (HOSPITAL_COMMUNITY): Payer: Self-pay | Admitting: Neurosurgery

## 2022-08-14 ENCOUNTER — Non-Acute Institutional Stay (SKILLED_NURSING_FACILITY): Payer: Self-pay | Admitting: Adult Health

## 2022-08-14 ENCOUNTER — Encounter: Payer: Self-pay | Admitting: Adult Health

## 2022-08-14 DIAGNOSIS — J84112 Idiopathic pulmonary fibrosis: Secondary | ICD-10-CM

## 2022-08-14 DIAGNOSIS — M4722 Other spondylosis with radiculopathy, cervical region: Secondary | ICD-10-CM

## 2022-08-14 DIAGNOSIS — Z981 Arthrodesis status: Secondary | ICD-10-CM

## 2022-08-14 DIAGNOSIS — M4712 Other spondylosis with myelopathy, cervical region: Secondary | ICD-10-CM

## 2022-08-14 NOTE — Progress Notes (Unsigned)
Location:  [facility] Room Number: 159 A Place of Service:  SNF (31)  Provider: Royal Hawthorn, NP   PCP: [name], MD Patient Care Team: [name], MD as PCP - General (Internal Medicine) Cira Rue, RN as Oncology Nurse Navigator O'Kelley, Mindi Slicker, RN as Oncology Nurse Navigator  Extended Emergency Contact Information Primary Emergency Contact: [facility] Address: [address]          [place], [place] [address] Johnnette Litter of [place] Phone: [phone] Relation: Spouse Secondary Emergency Contact: [place] [employer] Phone: [phone] Relation: Daughter  Code Status: Full Code  Goals of care:  Advanced Directive information    08/14/2022    8:43 AM  Advanced Directives  Does Patient Have a Medical Advance Directive? Yes  Type of Advance Directive [place]  Does patient want to make changes to medical advance directive? No - Patient declined  Copy of [place] in Chart? Yes - validated most recent copy scanned in chart (See row information)     No Known Allergies  Chief Complaint  Patient presents with   Discharge Note    Discharge from [facility].     HPI:  82 y.o. male seen for discharge from [facility]   PMH significant for B cell lymphoma, IPF, OP, chronic sinusitis, prostate ca  He lives in [place] and was experiencing significant cervical neck pain and was diagnosed with cervical spondylosis with radiculopathy and myelopathy.  He underwent an anterior cervical decompression/discectomy fusion C4-7 on 08/11/2022.  He reports his pain is improved after surgery. He is going to the bathroom and eating well. Able to ambulate and independent with ADLs.   No issues with breathing or swallowing.   Labs in hospital mostly unremarkable except for below.  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   Diverticulosis of colon (without mention of hemorrhage)    GERD (gastroesophageal reflux disease)    Hiatal hernia    HLD (hyperlipidemia)    Internal hemorrhoids without mention of complication    Irritable bowel syndrome    Other specified gastritis without mention of hemorrhage    Prostate cancer ([place]) 2022   Pulmonary fibrosis ([place])    Stress fracture 02/2021    Past Surgical History:  Procedure Laterality Date   ANTERIOR CERVICAL DECOMP/DISCECTOMY FUSION N/A 08/11/2022   Procedure: Anterior Cervical Decompression/Discectomy Fusion - Cervical Four-Cervical Five -  Cervical Five-Cervical Six - Cerivcal Six-Cerival Seven;  Surgeon: Earnie Larsson, MD;  Location: [facility];  Service: Neurosurgery;  Laterality: N/A;   CARPAL TUNNEL RELEASE     left   CATARACT EXTRACTION  06/2011   bilateral   DEEP NECK LYMPH NODE BIOPSY / EXCISION  03/25/2021   INGUINAL HERNIA REPAIR Right 09/05/2019   Procedure: OPEN REPAIR RIGHT INGUINAL HERNIA WITH MESH;  Surgeon: Armandina Gemma, MD;  Location: [facility];  Service: General;  Laterality: Right;   [place]   x 2   LUMBAR LAMINECTOMY/DECOMPRESSION MICRODISCECTOMY Left 07/11/2013   Procedure: LUMBAR ONE TO TWO, LUMBAR TWO TO THREE, LUMBAR THREE TO FOUR LUMBAR LAMINECTOMY/DECOMPRESSION MICRODISCECTOMY 3 LEVELS;  Surgeon: Charlie Pitter, MD;  Location: [facility];  Service: Neurosurgery;  Laterality: Left;   NASAL SINUS SURGERY     RIGHT/LEFT HEART CATH AND CORONARY ANGIOGRAPHY N/A 04/14/2022  Procedure: RIGHT/LEFT HEART CATH AND CORONARY ANGIOGRAPHY;  Surgeon: Larey Dresser, MD;  Location: [facility];  Service: Cardiovascular;  Laterality: N/A;      reports that he has never smoked. He has never used smokeless tobacco. He reports that he does not currently use alcohol. He reports that he does not use drugs. 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   Tobacco Use   Smoking status: Never   Smokeless tobacco: Never  Vaping Use   Vaping Use: Never used  Substance and Sexual Activity   Alcohol use: Not Currently    Comment: rarely   Drug use: No   Sexual activity: Not Currently  Other Topics Concern   Not on file  Social History Narrative   Retired.    Social Determinants of Health   Financial Resource Strain: Not on file  Food Insecurity: Not on file  Transportation Needs: Not on file  Physical Activity: Not on file  Stress: Not on file  Social Connections: Not on file  Intimate Partner Violence: Not on file   Functional Status Survey:    No Known Allergies  Pertinent  Health Maintenance Due  Topic Date Due   INFLUENZA VACCINE  Completed    Medications: Outpatient Encounter Medications as of 08/14/2022  Medication Sig   acetaminophen (TYLENOL) 500 MG tablet Take 1,000 mg by mouth every 6 (six) hours as needed for moderate pain.   Bempedoic Acid (NEXLETOL) 180 MG TABS Take 180 mg by mouth daily.   bismuth subsalicylate (PEPTO BISMOL) 262 MG/15ML suspension Take 30 mLs by mouth every 6 (six) hours as needed for diarrhea or loose stools or indigestion.   carboxymethylcellulose (REFRESH TEARS) 0.5 % SOLN Place 1 drop into both eyes daily as needed (dry eyes).   Cholecalciferol (VITAMIN D-3) 1000 UNITS CAPS Take 2,000 Units by mouth every evening.   CO-ENZYME Q-10 PO Take 1 tablet by mouth every evening.    Cyanocobalamin (VITAMIN B12) 1000 MCG TBCR Take 1,000 mcg by mouth daily.   denosumab (PROLIA) 60 MG/ML SOSY injection Inject 60 mg into the skin every 6 (six) months. At [place]   desvenlafaxine (PRISTIQ) 50 MG 24 hr tablet Take 50 mg by mouth daily.   diclofenac (VOLTAREN) 75 MG EC  tablet Take 75 mg by mouth 2 (two) times daily as needed (pain.).   diclofenac sodium (VOLTAREN) 1 % GEL Apply 4 g topically 4 (four) times daily as needed (pain.).   dicyclomine (BENTYL) 10 MG capsule Take one by mouth every 6 hours as needed for abdominal cramping   diphenoxylate-atropine (LOMOTIL) 2.5-0.025 MG tablet Take 1 tablet by mouth 4 (four) times daily as needed for diarrhea or loose stools.   Emollient (AQUAPHOR ADV PROTECT HEALING EX) Apply 1 Application topically daily as needed (itching).   Fluticasone Propionate (XHANCE) 93 MCG/ACT EXHU Place 1 spray into both nostrils 2 (two) times daily as needed (sinus).   Glucosamine Sulfate 500 MG TABS Take 500 mg by mouth daily.   HYDROcodone-acetaminophen (NORCO/VICODIN) 5-325 MG tablet Take 1 tablet by mouth every 4 (four) hours as needed for moderate pain ((score 4 to 6)).   latanoprost (XALATAN) 0.005 %  ophthalmic solution Place 1 drop into both eyes at bedtime.   loratadine (CLARITIN) 10 MG tablet Take 10 mg by mouth daily.   meclizine (ANTIVERT) 25 MG tablet Take 25 mg by mouth 3 (three) times daily as needed for dizziness.    methocarbamol (ROBAXIN) 500 MG tablet Take 500 mg by mouth daily as needed (severe muscle spasms).   Multiple Vitamin (MULTIVITAMIN) tablet Take 1 tablet by mouth daily.   omeprazole (PRILOSEC) 40 MG capsule Take 1 capsule (40 mg total) by mouth daily.   ondansetron (ZOFRAN) 8 MG tablet TAKE 1 TABLET (8 MG TOTAL) BY MOUTH EVERY 8 (EIGHT) HOURS AS NEEDED FOR NAUSEA OR VOMITING. START 72 HOURS AFTER IV CHEMOTHERAPY ADMINISTRATION   Pirfenidone (ESBRIET) 801 MG TABS Take 1 tablet by mouth 3 (three) times daily with meals.   Probiotic Product (ALIGN) 4 MG CAPS Take 4 mg by mouth daily.   [DISCONTINUED] acetaminophen-codeine (TYLENOL #3) 300-30 MG per tablet Take 1 tablet by mouth every 8 (eight) hours as needed for moderate pain.   [DISCONTINUED] aspirin EC 81 MG tablet Take 81 mg by mouth every Monday, Wednesday, and  Friday.   [DISCONTINUED] carisoprodol (SOMA) 350 MG tablet Take 175 mg by mouth 3 (three) times daily as needed for muscle spasms.   [DISCONTINUED] oxybutynin (DITROPAN) 5 MG tablet Take 5 mg by mouth at bedtime.   [DISCONTINUED] prochlorperazine (COMPAZINE) 10 MG tablet Take 1 tablet (10 mg total) by mouth every 6 (six) hours as needed for nausea.   No facility-administered encounter medications on file as of 08/14/2022.    Review of Systems  Constitutional:  Negative for activity change, appetite change, chills, diaphoresis, fatigue, fever and unexpected weight change.  Respiratory:  Negative for cough, shortness of breath, wheezing and stridor.   Cardiovascular:  Negative for chest pain, palpitations and leg swelling.  Gastrointestinal:  Negative for abdominal distention, abdominal pain, constipation and diarrhea.  Genitourinary:  Negative for difficulty urinating and dysuria.  Musculoskeletal:  Negative for arthralgias, back pain, gait problem, joint swelling and myalgias.  Skin:  Positive for wound.  Neurological:  Negative for dizziness, seizures, syncope, facial asymmetry, speech difficulty, weakness and headaches.  Hematological:  Negative for adenopathy. Does not bruise/bleed easily.  Psychiatric/Behavioral:  Negative for agitation, behavioral problems and confusion.     Vitals:   08/14/22 0841 08/15/22 0731  BP: (!) 158/93 129/87  Pulse: 98   Resp: 18   Temp: (!) 97 F (36.1 C)   SpO2: 97%   Weight: 169 lb (76.7 kg)   Height: 5' 9"$  (1.753 m)    Body mass index is 24.96 kg/m. Physical Exam Vitals and nursing note reviewed.  Constitutional:      General: He is not in acute distress.    Appearance: He is not diaphoretic.  HENT:     Head: Normocephalic and atraumatic.  Neck:     Thyroid: No thyromegaly.     Vascular: No JVD.     Trachea: No tracheal deviation.     Comments: Anterior neck incision with steri strips in place. Surrounding edema. No drainage or warmth or  redness. Dependent bruising to mid chest line.  Cardiovascular:     Rate and Rhythm: Normal rate and regular rhythm.     Heart sounds: No murmur heard. Pulmonary:     Effort: Pulmonary effort is normal. No respiratory distress.     Breath sounds: Normal breath sounds. No wheezing.  Abdominal:     General: Bowel sounds are normal. There  is no distension.     Palpations: Abdomen is soft.     Tenderness: There is no abdominal tenderness.  Lymphadenopathy:     Cervical: No cervical adenopathy.  Skin:    General: Skin is warm and dry.  Neurological:     Mental Status: He is alert and oriented to person, place, and time.     Cranial Nerves: No cranial nerve deficit.     Labs reviewed: Basic Metabolic Panel: Recent Labs    10/27/21 1305 10/28/21 1642 11/20/21 0838 04/14/22 1026 04/14/22 1333 04/14/22 1334 06/04/22 0819 08/07/22 1056  NA 133* 131*   < > 135   < > 138 135 132*  K 3.5 4.1   < > 4.2   < > 4.3 4.6 4.3  CL 97* 96   < > 102  --   --  100 95*  CO2 29 28   < > 27  --   --  30 28  GLUCOSE 156* 108*   < > 118*  --   --  145* 100*  BUN 14 14   < > 14  --   --  15 12  CREATININE 0.60* 0.59   < > 0.70  --   --  0.77 0.72  CALCIUM 8.8* 8.6   < > 8.6*  --   --  8.7* 8.8*  MG 2.0 1.9  --   --   --   --   --   --   PHOS  --  4.2  --   --   --   --   --   --    < > = values in this interval not displayed.   Liver Function Tests: Recent Labs    01/09/22 1108 02/26/22 0830 06/04/22 0819  AST 22 19 19  $ ALT 18 15 11  $ ALKPHOS 43 42 30*  BILITOT 0.4 0.4 0.5  PROT 7.0 6.7 6.9  ALBUMIN 4.4 4.1 4.4   No results for input(s): "LIPASE", "AMYLASE" in the last 8760 hours. No results for input(s): "AMMONIA" in the last 8760 hours. CBC: Recent Labs    01/09/22 1108 02/26/22 0830 04/14/22 1026 04/14/22 1333 04/14/22 1334 06/04/22 0819 08/07/22 1056  WBC 6.2 4.3 3.7*  --   --  4.5 6.4  NEUTROABS 4.8 2.8  --   --   --  2.7  --   HGB 12.2* 11.8* 12.4*   < > 11.2* 11.4*  11.9*  HCT 35.6* 33.7* 34.8*   < > 33.0* 32.1* 34.8*  MCV 94.6 90.6 91.3  --   --  92.5 94.8  PLT 209.0 199 191  --   --  200 234   < > = values in this interval not displayed.   Cardiac Enzymes: Recent Labs    04/07/22 1526  CKTOTAL 98   BNP: Invalid input(s): "POCBNP" CBG: Recent Labs    09/29/21 0730  GLUCAP 119*    Procedures and Imaging Studies During Stay: DG Cervical Spine 1 View  Result Date: 08/11/2022 CLINICAL DATA:  Cervical fusion. EXAM: DG CERVICAL SPINE - 1 VIEW COMPARISON:  MRI cervical spine 07/27/2022 FINDINGS: Initial intraoperative lateral cervical spine film demonstrates a spinal needle marking the C5 level. Subsequent images demonstrate anterior and interbody fusion hardware extending from C4 to C7. No complicating features are identified. IMPRESSION: Anterior and interbody fusion hardware extending from C4 to C7. Electronically Signed   By: Marijo Sanes M.D.   On: 08/11/2022 11:37   DG  C-Arm 1-60 Min-No Report  Result Date: 08/11/2022 Fluoroscopy was utilized by the requesting physician.  No radiographic interpretation.   DG C-Arm 1-60 Min-No Report  Result Date: 08/11/2022 Fluoroscopy was utilized by the requesting physician.  No radiographic interpretation.   MR CERVICAL SPINE WO CONTRAST  Result Date: 07/27/2022 CLINICAL DATA:  Cervical radiculopathy. EXAM: MRI CERVICAL SPINE WITHOUT CONTRAST TECHNIQUE: Multiplanar, multisequence MR imaging of the cervical spine was performed. No intravenous contrast was administered. COMPARISON:  Radiographs July 20, 2022. FINDINGS: Alignment: Mild reversal of the cervical curvature. Small anterolisthesis of C3 over C4. Trace retrolisthesis of C4 over C5. Vertebrae: No fracture, evidence of discitis, or bone lesion. Endplate degenerative changes throughout the cervical spine with partial fusion of the C3-4 disc space. Cord: Mass effect on the cord at C2-3, C4-5 C5-6 and C6-7. No cord signal abnormality. Posterior Fossa,  vertebral arteries, paraspinal tissues: Bilateral atlantoaxial and atlantooccipital joint effusion. Disc levels: C2-3: Posterior disc osteophyte complex and ligamentum flavum infolding resulting in moderate spinal canal stenosis with mild mass effect on the cord. Uncovertebral and facet degenerative changes with prominent left facet hypertrophy resulting in mild to moderate left neural foraminal narrowing. C3-4: Posterior disc osteophyte complex resulting mild spinal canal stenosis. Uncovertebral and facet degenerative changes resulting in mild right and mild-to-moderate left neural foraminal narrowing. C4-5: Posterior disc osteophyte complex resulting moderate spinal canal stenosis with mass effect on the cord. Uncovertebral and facet degenerative change resulting in moderate bilateral neural narrowing. C5-6: Posterior disc osteophyte complex resulting moderate spinal canal stenosis with mass effect the cord. Uncovertebral and facet degenerative changes resulting moderate bilateral neural foraminal narrowing. C6-7: Posterior disc osteophyte complex resulting moderate spinal canal stenosis with mass effect on the cord. Uncovertebral and facet degenerative changes resulting moderate right and severe left neural foraminal narrowing. C7-T1: Posterior disc osteophyte complex resulting in mild spinal canal stenosis. Uncovertebral and facet degenerative changes resulting in severe bilateral neural foraminal narrowing. T1-2: This level is only seen on sagittal views. Posterior disc protrusion and facet degenerative changes resulting in moderate to severe bilateral neural foraminal narrowing and mild spinal canal stenosis. IMPRESSION: 1. Advanced degenerative changes of the cervical spine with moderate spinal canal stenosis at C2-3, C4-5, C5-6 and C6-7. 2. Multilevel high-grade neural foraminal narrowing, as described above. Electronically Signed   By: Pedro Earls M.D.   On: 07/27/2022 09:09     Assessment/Plan:    1. Cervical spondylosis with myelopathy and radiculopathy Led to significant pain, not responding to traditional treatments.   2. S/P cervical spinal fusion By Dr Trenton Gammon Doing well and ready for discharge. Has norco at the pharmacy for pain.  He does have some swelling at the incision site (likely due to surgery) and I took picture of this area and he will send it to Dr Trenton Gammon and f/u.  No signs of infection at this time.   3. 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[place]) On [place] Followed by Dr [name]  Did very well with surgery.   Patient is being discharged with the following home health services:  NA  Patient is being discharged with the following durable medical equipment:  NA  Patient has been advised to f/u with their PCP in 1-2 weeks to for a transitions of care visit.  Social services at their facility was responsible for arranging this appointment.  Pt was provided with adequate prescriptions of noncontrolled medications to reach the scheduled appointment .  For controlled substances, a limited supply was provided as appropriate for  the individual patient.  If the pt normally receives these medications from a pain clinic or has a contract with another physician, these medications should be received from that clinic or physician only).    Future labs/tests needed:  CBC BMP

## 2022-08-15 ENCOUNTER — Encounter: Payer: Self-pay | Admitting: Adult Health

## 2022-08-23 ENCOUNTER — Encounter (HOSPITAL_BASED_OUTPATIENT_CLINIC_OR_DEPARTMENT_OTHER): Payer: Self-pay | Admitting: Emergency Medicine

## 2022-08-23 ENCOUNTER — Emergency Department (HOSPITAL_BASED_OUTPATIENT_CLINIC_OR_DEPARTMENT_OTHER): Payer: PPO

## 2022-08-23 ENCOUNTER — Emergency Department (HOSPITAL_BASED_OUTPATIENT_CLINIC_OR_DEPARTMENT_OTHER)
Admission: EM | Admit: 2022-08-23 | Discharge: 2022-08-23 | Disposition: A | Discharge status: Home / Self Care | Payer: PPO | Attending: Emergency Medicine | Admitting: Emergency Medicine

## 2022-08-23 ENCOUNTER — Other Ambulatory Visit: Payer: Self-pay

## 2022-08-23 DIAGNOSIS — R000 Tachycardia, unspecified: Secondary | ICD-10-CM | POA: Diagnosis not present

## 2022-08-23 DIAGNOSIS — R0602 Shortness of breath: Secondary | ICD-10-CM | POA: Diagnosis not present

## 2022-08-23 DIAGNOSIS — I493 Ventricular premature depolarization: Secondary | ICD-10-CM | POA: Insufficient documentation

## 2022-08-23 DIAGNOSIS — R002 Palpitations: Secondary | ICD-10-CM | POA: Diagnosis not present

## 2022-08-23 DIAGNOSIS — R42 Dizziness and giddiness: Secondary | ICD-10-CM | POA: Diagnosis not present

## 2022-08-23 LAB — CBC WITH DIFFERENTIAL/PLATELET
Abs Immature Granulocytes: 0.03 10*3/uL (ref 0.00–0.07)
Basophils Absolute: 0 10*3/uL (ref 0.0–0.1)
Basophils Relative: 0 %
Eosinophils Absolute: 0.2 10*3/uL (ref 0.0–0.5)
Eosinophils Relative: 2 %
HCT: 32.5 % — ABNORMAL LOW (ref 39.0–52.0)
Hemoglobin: 11.3 g/dL — ABNORMAL LOW (ref 13.0–17.0)
Immature Granulocytes: 1 %
Lymphocytes Relative: 12 %
Lymphs Abs: 0.8 10*3/uL (ref 0.7–4.0)
MCH: 32.3 pg (ref 26.0–34.0)
MCHC: 34.8 g/dL (ref 30.0–36.0)
MCV: 92.9 fL (ref 80.0–100.0)
Monocytes Absolute: 0.5 10*3/uL (ref 0.1–1.0)
Monocytes Relative: 8 %
Neutro Abs: 5.1 10*3/uL (ref 1.7–7.7)
Neutrophils Relative %: 77 %
Platelets: 283 10*3/uL (ref 150–400)
RBC: 3.5 MIL/uL — ABNORMAL LOW (ref 4.22–5.81)
RDW: 12.8 % (ref 11.5–15.5)
WBC: 6.5 10*3/uL (ref 4.0–10.5)
nRBC: 0 % (ref 0.0–0.2)

## 2022-08-23 LAB — BASIC METABOLIC PANEL
Anion gap: 9 (ref 5–15)
BUN: 13 mg/dL (ref 8–23)
CO2: 27 mmol/L (ref 22–32)
Calcium: 9.2 mg/dL (ref 8.9–10.3)
Chloride: 98 mmol/L (ref 98–111)
Creatinine, Ser: 0.59 mg/dL — ABNORMAL LOW (ref 0.61–1.24)
GFR, Estimated: >60 mL/min (ref >60)
Glucose, Bld: 168 mg/dL — ABNORMAL HIGH (ref 70–99)
Potassium: 4 mmol/L (ref 3.5–5.1)
Sodium: 134 mmol/L — ABNORMAL LOW (ref 135–145)

## 2022-08-23 LAB — URINALYSIS, ROUTINE W REFLEX MICROSCOPIC
Bilirubin Urine: NEGATIVE
Glucose, UA: NEGATIVE mg/dL
Hgb urine dipstick: NEGATIVE
Ketones, ur: NEGATIVE mg/dL
Leukocytes,Ua: NEGATIVE
Nitrite: NEGATIVE
Protein, ur: NEGATIVE mg/dL
Specific Gravity, Urine: 1.029 (ref 1.005–1.030)
pH: 7.5 (ref 5.0–8.0)

## 2022-08-23 LAB — MAGNESIUM: Magnesium: 2 mg/dL (ref 1.7–2.4)

## 2022-08-23 LAB — HEPATIC FUNCTION PANEL
ALT: 12 U/L (ref 0–44)
AST: 20 U/L (ref 15–41)
Albumin: 4.4 g/dL (ref 3.5–5.0)
Alkaline Phosphatase: 42 U/L (ref 38–126)
Bilirubin, Direct: 0.1 mg/dL (ref 0.0–0.2)
Indirect Bilirubin: 0.3 mg/dL (ref 0.3–0.9)
Total Bilirubin: 0.4 mg/dL (ref 0.3–1.2)
Total Protein: 7 g/dL (ref 6.5–8.1)

## 2022-08-23 LAB — PHOSPHORUS: Phosphorus: 3.2 mg/dL (ref 2.5–4.6)

## 2022-08-23 LAB — TROPONIN I (HIGH SENSITIVITY)
Troponin I (High Sensitivity): 7 ng/L (ref <18)
Troponin I (High Sensitivity): 7 ng/L (ref <18)

## 2022-08-23 LAB — LACTIC ACID, PLASMA
Lactic Acid, Venous: 0.9 mmol/L (ref 0.5–1.9)
Lactic Acid, Venous: 0.7 mmol/L (ref 0.5–1.9)

## 2022-08-23 LAB — TSH: TSH: 0.644 u[IU]/mL (ref 0.350–4.500)

## 2022-08-23 MED ORDER — IOHEXOL 350 MG/ML SOLN
100.0000 mL | Freq: Once | INTRAVENOUS | Status: AC | PRN
  Administered 2022-08-23: 80 mL via INTRAVENOUS

## 2022-08-23 MED ORDER — LACTATED RINGERS IV BOLUS
500.0000 mL | Freq: Once | INTRAVENOUS | Status: AC
  Administered 2022-08-23: 500 mL via INTRAVENOUS

## 2022-08-23 MED ORDER — METOPROLOL TARTRATE 25 MG PO TABS
12.5000 mg | ORAL_TABLET | Freq: Once | ORAL | Status: AC
  Administered 2022-08-23: 12.5 mg via ORAL
  Filled 2022-08-23: qty 1

## 2022-08-23 MED ORDER — METOPROLOL TARTRATE 25 MG PO TABS: 12.5000 mg | ORAL_TABLET | Freq: Two times a day (BID) | ORAL | 0 refills | Status: DC

## 2022-08-23 NOTE — ED Provider Notes (Signed)
[facility] Provider Note   CSN: [id number] Arrival date & time: 08/23/22  1119     History  Chief Complaint  Patient presents with   Palpitations    Kenneth [name] is a 82 y.o. male.  HPI Patient had a three-level anterior cervical spine decompression and fusion surgery about 2 weeks ago.  He reports he has been doing really well.  He had good relief of pain after the procedure and has been up and moving around.  He has not had fever.  He however, over the past Avril days his heart rate has been elevated in the 90s sometimes up to the 120s.  His Apple monitor indicated he need to have the rhythm checked.  Patient denies he has been having any chest pain or near syncope.  Is not felt more short of breath than at baseline. No  Productive cough.  No swelling in the legs.  Reports just prior to his surgery he did have several courses of steroids but has not been on them after surgery.  Patient reports he drank a Monster drink about 4 days ago but has not been consuming excessive caffeine since then.  Was able to get back to some of his regular exercise on a stationary bike and heart rate did not go up over 120.    Home Medications Prior to Admission medications   Medication Sig Start Date End Date Taking? Authorizing Provider  metoprolol tartrate (LOPRESSOR) 25 MG tablet Take 0.5 tablets (12.5 mg total) by mouth 2 (two) times daily. 08/23/22  Yes Charlesetta Shanks, MD  acetaminophen (TYLENOL) 500 MG tablet Take 1,000 mg by mouth every 6 (six) hours as needed for moderate pain.    [provider]  Bempedoic Acid (NEXLETOL) 180 MG TABS Take 180 mg by mouth daily.    [provider]  bismuth subsalicylate (PEPTO BISMOL) 262 MG/15ML suspension Take 30 mLs by mouth every 6 (six) hours as needed for diarrhea or loose stools or indigestion.    [provider]  carboxymethylcellulose (REFRESH TEARS) 0.5 % SOLN Place 1 drop into both  eyes daily as needed (dry eyes).    [provider]  Cholecalciferol (VITAMIN D-3) 1000 UNITS CAPS Take 2,000 Units by mouth every evening.    [provider]  CO-ENZYME Q-10 PO Take 1 tablet by mouth every evening.     [provider]  Cyanocobalamin (VITAMIN B12) 1000 MCG TBCR Take 1,000 mcg by mouth daily.    [provider]  denosumab (PROLIA) 60 MG/ML SOSY injection Inject 60 mg into the skin every 6 (six) months. At [facility]    [provider]  desvenlafaxine (PRISTIQ) 50 MG 24 hr tablet Take 50 mg by mouth daily. 05/26/22   [provider]  diclofenac (VOLTAREN) 75 MG EC tablet Take 75 mg by mouth 2 (two) times daily as needed (pain.). 01/13/18   [provider]  diclofenac sodium (VOLTAREN) 1 % GEL Apply 4 g topically 4 (four) times daily as needed (pain.). 01/13/18   [provider]  dicyclomine (BENTYL) 10 MG capsule Take one by mouth every 6 hours as needed for abdominal cramping 01/24/15   Irene Shipper, MD  diphenoxylate-atropine (LOMOTIL) 2.5-0.025 MG tablet Take 1 tablet by mouth 4 (four) times daily as needed for diarrhea or loose stools. 02/22/17   [provider]  Emollient (AQUAPHOR ADV PROTECT HEALING EX) Apply 1 Application topically daily as needed (itching).  [provider]  Fluticasone Propionate (XHANCE) 93 MCG/ACT EXHU Place 1 spray into both nostrils 2 (two) times daily as needed (sinus).    [provider]  Glucosamine Sulfate 500 MG TABS Take 500 mg by mouth daily.    [provider]  HYDROcodone-acetaminophen (NORCO/VICODIN) 5-325 MG tablet Take 1 tablet by mouth every 4 (four) hours as needed for moderate pain ((score 4 to 6)). 08/12/22   Earnie Larsson, MD  latanoprost (XALATAN) 0.005 % ophthalmic solution Place 1 drop into both eyes at bedtime.    [provider]  loratadine (CLARITIN) 10 MG tablet Take 10 mg by mouth daily.    [provider]  meclizine (ANTIVERT) 25 MG tablet Take 25 mg by mouth 3 (three) times daily as needed for dizziness.     [provider]  methocarbamol (ROBAXIN) 500 MG tablet Take 500 mg by mouth every 8 (eight) hours as needed (severe muscle spasms). 10/27/10   [provider]  Multiple Vitamin (MULTIVITAMIN) tablet Take 1 tablet by mouth daily.    [provider]  omeprazole (PRILOSEC) 40 MG capsule Take 1 capsule (40 mg total) by mouth daily. 12/11/21   Irene Shipper, MD  ondansetron (ZOFRAN) 8 MG tablet TAKE 1 TABLET (8 MG TOTAL) BY MOUTH EVERY 8 (EIGHT) HOURS AS NEEDED FOR NAUSEA OR VOMITING. START 72 HOURS AFTER IV CHEMOTHERAPY ADMINISTRATION 03/10/22   Owens Shark, NP  Pirfenidone (ESBRIET) 801 MG TABS Take 1 tablet by mouth 3 (three) times daily with meals. 06/23/22   Brand Males, MD  Probiotic Product (ALIGN) 4 MG CAPS Take 4 mg by mouth daily.    [provider]      Allergies    Patient has no known allergies.    Review of Systems   Review of Systems  Physical Exam Updated Vital Signs BP (!) 140/80   Pulse 72   Temp 97.9 F (36.6 C) (Oral)   Resp (!) 21   SpO2 97%  Physical Exam Constitutional:      Comments: Alert nontoxic well in appearance.  No respiratory distress.  HENT:     Head: Normocephalic and atraumatic.     Mouth/Throat:     Pharynx: Oropharynx is clear.  Eyes:     Extraocular Movements: Extraocular movements intact.  Neck:     Comments: Surgical scar on lower right neck is healing very well.  No erythema no drainage.  No atypical swelling.  Resolving ecchymosis. Cardiovascular:     Comments: Borderline tachycardia.  Heart rate is in the 90s with occasional ectopy. Pulmonary:     Effort: Pulmonary effort is normal.     Breath sounds: Normal breath sounds.  Abdominal:     General: There is no distension.     Palpations: Abdomen is soft.     Tenderness: There is no abdominal tenderness. There is no guarding.   Musculoskeletal:        General: No swelling or tenderness. Normal range of motion.     Right lower leg: No edema.     Left lower leg: No edema.     Comments: No peripheral edema calf soft and nontender.  Skin:    General: Skin is warm and dry.  Neurological:     General: No focal deficit present.     Mental Status: He is oriented to person, place, and time.     Motor: No weakness.     Coordination: Coordination normal.  Psychiatric:  Mood and Affect: Mood normal.     ED Results / Procedures / Treatments   Labs (all labs ordered are listed, but only abnormal results are displayed) Labs Reviewed  CBC WITH DIFFERENTIAL/PLATELET - Abnormal; Notable for the following components:      Result Value   RBC 3.50 (*)    Hemoglobin 11.3 (*)    HCT 32.5 (*)    All other components within normal limits  BASIC METABOLIC PANEL - Abnormal; Notable for the following components:   Sodium 134 (*)    Glucose, Bld 168 (*)    Creatinine, Ser 0.59 (*)    All other components within normal limits  MAGNESIUM  PHOSPHORUS  TSH  LACTIC ACID, PLASMA  HEPATIC FUNCTION PANEL  URINALYSIS, ROUTINE W REFLEX MICROSCOPIC  LACTIC ACID, PLASMA  TROPONIN I (HIGH SENSITIVITY)  TROPONIN I (HIGH SENSITIVITY)    EKG EKG Interpretation  Date/Time:  Sunday August 23 2022 13:38:34 EST Ventricular Rate:  103 PR Interval:  164 QRS Duration: 85 QT Interval:  368 QTC Calculation: 403 R Axis:   3 Text Interpretation: Sinus tachycardia Ventricular bigeminy Abnormal R-wave progression, early transition agree. Confirmed by Charlesetta Shanks [id number]) on 08/23/2022 3:32:14 PM  Radiology CT Angio Chest PE W/Cm &/Or Wo Cm  Result Date: 08/23/2022 CLINICAL DATA:  Baseline shortness of breath. Increased dizziness. Irregular heart rate with palpitations. EXAM: CT ANGIOGRAPHY CHEST WITH CONTRAST TECHNIQUE: Multidetector CT imaging of the chest was performed using the standard protocol during bolus administration  of intravenous contrast. Multiplanar CT image reconstructions and MIPs were obtained to evaluate the vascular anatomy. RADIATION DOSE REDUCTION: This exam was performed according to the departmental dose-optimization program which includes automated exposure control, adjustment of the mA and/or kV according to patient size and/or use of iterative reconstruction technique. CONTRAST:  65m OMNIPAQUE IOHEXOL 350 MG/ML SOLN COMPARISON:  03/19/2021. FINDINGS: Cardiovascular: Pulmonary arteries are well opacified. There is no evidence of a pulmonary embolism. Heart normal in size. No pericardial effusion. Left coronary artery and circumflex coronary artery calcifications. Ascending aorta dilated to 4.1 cm. No dissection. Aortic atherosclerosis. Arch branch vessels are widely patent. Enlarged pulmonary arteries, right 3.6 cm and left 3.1 cm. Mediastinum/Nodes: No neck base, mediastinal or hilar masses. No enlarged lymph nodes. Trachea and esophagus are unremarkable. Lungs/Pleura: Chronic changes of interstitial lung disease with peripheral reticulation, honeycombing and mild bronchiectasis, most prominent at the right lung base. No lung consolidation to suggest pneumonia. No evidence of pulmonary edema. No pleural effusion or pneumothorax. Upper Abdomen: No acute findings.  Aortic atherosclerosis. Musculoskeletal: No fracture or acute finding. No bone lesion. No chest wall mass. Review of the MIP images confirms the above findings. IMPRESSION: 1. No evidence of a pulmonary embolism. 2. No acute findings. 3. 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 4. 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 5. 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 Recommend annual imaging followup by CTA or MRA. This recommendation follows 2010 ACCF/AHA/AATS/ACR/ASA/SCA/SCAI/SIR/STS/SVM Guidelines for the Diagnosis and Management of Patients  with Thoracic Aortic Disease. Circulation. 2010; 121:[id number] Aortic aneurysm NOS (ICD10-I71.9) 6. Aortic atherosclerosis. Aortic Atherosclerosis (ICD10-I70.0). Electronically Signed   By: DLajean ManesM.D.   On: 08/23/2022 13:33    Procedures Procedures    Medications Ordered in ED Medications  lactated ringers bolus 500 mL (0 mLs Intravenous Stopped 08/23/22 1459)  iohexol (OMNIPAQUE) 350 MG/ML injection 100 mL (80 mLs Intravenous Contrast Given 08/23/22 1317)  metoprolol tartrate (LOPRESSOR) tablet 12.5 mg (12.5 mg Oral Given 08/23/22 1455)    ED Course/ Medical Decision Making/ A&P                             Medical Decision Making Amount and/or Complexity of Data Reviewed Labs: ordered. Radiology: ordered.  Risk Prescription drug management.  Presents with moderate tachycardia and increased PVCs.  Clinically he is well in appearance.  He did have more recent cervical fusion surgery but no apparent complications.  He is doing very well.  [place] diagnosis includes infectious source\metabolic derangement\PE\atypical ACS\medication reaction.  Will proceed with broad diagnostic evaluation.  Patient does not have chest pain and has normal blood pressures, no signs of hypoperfusion, will proceed with diagnostic evaluation.  Troponin normal.  Metabolic panel normal except CBG 168, stable mild anemia at 11 mg/dL.  Urinalysis negative.  CT PE study without PE or other acute findings.  Findings consistent with chronic interstitial lung disease.  Consult: Reviewed with cardiology Dr.Mealor.  We reviewed patient's EKGs and history as well as lab results.  At this time no evident cause for tachycardia with PVCs, okay to trial low-dose metoprolol with close follow-up.  Patient is clinically well.  No apparent immediately modifiable source for tachycardia.  Will start very low-dose of beta-blocker and recommend very close follow-up with PCP.  We discussed monitoring and holding  beta-blocker if heart rate low.  We discussed low threshold for return if any concerning symptoms.        Final Clinical Impression(s) / ED Diagnoses Final diagnoses:  Tachycardia  PVC (premature ventricular contraction)    Rx / DC Orders ED Discharge Orders          Ordered    metoprolol tartrate (LOPRESSOR) 25 MG tablet  2 times daily        08/23/22 1517              Charlesetta Shanks, MD 08/23/22 1536

## 2022-08-23 NOTE — Discharge Instructions (Signed)
1.  At this time there is not a clear cause for your tachycardia.  Extensive evaluation has ruled out probable infection, metabolic derangement, embolus, heart attack. 2.  You are being started on metoprolol at a low dose.  Take 12.5 mg in the morning and evening.  Continue to closely monitor your heart rate and how you are feeling.  If your heart rate is less than 60, hold your metoprolol dose. Call your cardiologist on Monday morning to schedule a recheck as soon as possible. 3.  Return to the emergency department immediately if you have concerning new symptoms.

## 2022-08-23 NOTE — ED Triage Notes (Addendum)
Pt presents to ED POV with family. Pt c/o  irregular HR and palpitations. Pt reports baseline SOB and increased dizziness. Pt reports having recent steroids for cervical spine surgery. Reports being in ST after steroids in the past.

## 2022-08-24 NOTE — Progress Notes (Signed)
PCP: [name], MD Pulmonology: Dr. [name] Cardiology: Dr. Aundra Dubin  82 y.o. with history of pulmonary fibrosis, high grade B cell lymphoma, and prostate cancer was referred by Dr. [name] for evaluation of dyspnea and tachycardia.  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  He is on Esbriet.  He has not used home oxygen.  He has baseline exertional dyspnea.  In 5/22, he had XRT for prostate cancer.  In 9/22, he was diagnosed with high grade B cell lymphoma and finished his 6th cycle of mini-CHOP in 4/23.  Pre-op echo in 9/22 showed EF 65-70%, normal RV, unable to estimate PASP, mild AS mean gradient 12 mmHg.  Patient has been noted by CT to have coronary calcification.   After completing CHOP in 4/23, patient developed persistent sinus tachycardia.  He felt palpitations.  D dimer was normal.  No evidence for infection.  Echo in 4/23 showed EF 55-60%, normal RV, unable to assess PA pressure (no TR jet), and nodular calcification of the mitral valve.  Due to mitral valve abnormality, blood cultures were done which were negative.  Sinus tachycardia eventually subsided on its own.   Follow up 5/23, doing well and back exercising. Discussed RHC to assess candidacy for starting Tyvaso. Deferred to Dr. [name].  There was talk over doing CTA to further investigate etiology of dyspnea. Felt arranging LHC at time of RHC would make most sense.   Underwent [facility] 10/23 showing mild LAD lesion 40% stenosed, normal filling pressures, normal PA pressure, normal CO.  Today he returns for post-cath follow up. Overall feeling fine. He continues to go to the driving range. He has mild shortness of breath with increased activity. Continues with fatigue. He does not snore. Denies palpitations CP, dizziness, edema, or PND/Orthopnea. Appetite ok. No fever or chills. Weight at home stable. Taking all medications. Keeps a close eye on his oxygen saturations.  He is questioning if anemia is driving his symptoms. 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 Daughter is dermatologist, Dr. Amy Simerly [place].   Labs (4/23): D dimer negative, BNP 137, HS-TnI 13, K 4.1, creatinine 0.59, hgb 10.2, WBCs 12 Labs (5/23): K 4.4, creatinine 0.64, hgb 11.3  PMH: 1. IBS 2. 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  - CT chest in 9/22 with pulmonary fibrosis, enlarged PA, coronary artery calcium.  3. CAD: Coronary calcification on 9/22 CT chest.  - Cardiolite in 9/18 showed no ischemia or infarction.  - LHC (10/23): mild non obs CAD, 40% mid LAD lesion 4. Hyperlipidemia 5. High grade B cell lymphoma: Diagnosed in 9/22.  He received 6 cycles of mini-CHOP.  - Echo (9/22): Prechemo, EF 65-70%, normal RV, unable to estimate PASP, mild AS mean gradient 12 mmHg.  - Echo (4/23): EF 55-60%, normal RV, unable to assess PA pressure (no TR jet), and nodular calcification of the mitral valve.  - RHC (10/23): RA 2, PA 24/5 (mean 14), PCWP 5, CO/CI (6.26/3.29 6. Aortic stenosis: Mild on 9/22 echo.  7. 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  8. H/o L-spine compression fracture.   SH: Married, lives in [place], retired Stage manager, nonsmoker, rare ETOH.   Family History  Problem Relation Age of Onset   Heart failure Father 25   Bladder Cancer Mother    Breast cancer Mother    Colon cancer Neg Hx    Pancreatic cancer Neg Hx    Rectal cancer Neg Hx    Stomach cancer  Neg Hx    Prostate cancer Neg Hx    ROS: All systems reviewed and negative except as per HPI.   Current Outpatient Medications  Medication Sig Dispense Refill   acetaminophen (TYLENOL) 500 MG tablet Take 1,000 mg by mouth every 6 (six) hours as needed for moderate pain.     Bempedoic Acid (NEXLETOL) 180 MG TABS Take 180 mg by mouth daily.     bismuth subsalicylate (PEPTO BISMOL) 262 MG/15ML suspension Take 30 mLs by mouth every 6 (six) hours as needed for diarrhea or loose stools or indigestion.      carboxymethylcellulose (REFRESH TEARS) 0.5 % SOLN Place 1 drop into both eyes daily as needed (dry eyes).     Cholecalciferol (VITAMIN D-3) 1000 UNITS CAPS Take 2,000 Units by mouth every evening.     CO-ENZYME Q-10 PO Take 1 tablet by mouth every evening.      Cyanocobalamin (VITAMIN B12) 1000 MCG TBCR Take 1,000 mcg by mouth daily.     denosumab (PROLIA) 60 MG/ML SOSY injection Inject 60 mg into the skin every 6 (six) months. At [facility]     desvenlafaxine (PRISTIQ) 50 MG 24 hr tablet Take 50 mg by mouth daily.     diclofenac (VOLTAREN) 75 MG EC tablet Take 75 mg by mouth 2 (two) times daily as needed (pain.).  2   diclofenac sodium (VOLTAREN) 1 % GEL Apply 4 g topically 4 (four) times daily as needed (pain.).  12   dicyclomine (BENTYL) 10 MG capsule Take one by mouth every 6 hours as needed for abdominal cramping 30 capsule 1   diphenoxylate-atropine (LOMOTIL) 2.5-0.025 MG tablet Take 1 tablet by mouth 4 (four) times daily as needed for diarrhea or loose stools.     Emollient (AQUAPHOR ADV PROTECT HEALING EX) Apply 1 Application topically daily as needed (itching).     Fluticasone Propionate (XHANCE) 93 MCG/ACT EXHU Place 1 spray into both nostrils 2 (two) times daily as needed (sinus).     Glucosamine Sulfate 500 MG TABS Take 500 mg by mouth daily.     HYDROcodone-acetaminophen (NORCO/VICODIN) 5-325 MG tablet Take 1 tablet by mouth every 4 (four) hours as needed for moderate pain ((score 4 to 6)). 30 tablet 0   latanoprost (XALATAN) 0.005 % ophthalmic solution Place 1 drop into both eyes at bedtime.     loratadine (CLARITIN) 10 MG tablet Take 10 mg by mouth daily.     meclizine (ANTIVERT) 25 MG tablet Take 25 mg by mouth 3 (three) times daily as needed for dizziness.      methocarbamol (ROBAXIN) 500 MG tablet Take 500 mg by mouth every 8 (eight) hours as needed (severe muscle spasms).     metoprolol tartrate (LOPRESSOR) 25 MG tablet Take 0.5 tablets (12.5 mg total) by mouth 2  (two) times daily. 30 tablet 0   Multiple Vitamin (MULTIVITAMIN) tablet Take 1 tablet by mouth daily.     omeprazole (PRILOSEC) 40 MG capsule Take 1 capsule (40 mg total) by mouth daily. 30 capsule 11   ondansetron (ZOFRAN) 8 MG tablet TAKE 1 TABLET (8 MG TOTAL) BY MOUTH EVERY 8 (EIGHT) HOURS AS NEEDED FOR NAUSEA OR VOMITING. START 72 HOURS AFTER IV CHEMOTHERAPY ADMINISTRATION 30 tablet 1   Pirfenidone (ESBRIET) 801 MG TABS Take 1 tablet by mouth 3 (three) times daily with meals. 270 tablet 1   Probiotic Product (ALIGN) 4 MG CAPS Take 4 mg by mouth daily.     No current facility-administered medications for  this visit.   There were no vitals taken for this visit. Physical Exam General:  NAD. No resp difficulty, appears younger than stated age 34: Normal Neck: Supple. No JVD. Carotids 2+ bilat; no bruits. No lymphadenopathy or thryomegaly appreciated. Cor: PMI nondisplaced. Regular rate & rhythm. No rubs, gallops, 2/6 SEM RUSB Lungs: Clear Abdomen: Soft, nontender, nondistended. No hepatosplenomegaly. No bruits or masses. Good bowel sounds. Extremities: No cyanosis, clubbing, rash, edema Neuro: Alert & oriented x 3, cranial nerves grossly intact. Moves all 4 extremities w/o difficulty. Affect pleasant.  Assessment/Plan: 1. Sinus tachycardia: Resolved.  TSH, D dimer normal.  Echo in 4/23 showed EF 55-60%, normal RV, unable to assess PA pressure (no TR jet), and nodular calcification of the mitral valve.  Suspect this may have been due to the cumulative effect of chemotherapy and steroids.   2. Exertional dyspnea: Suspect this is due to IPF.  Echo in 4/23 showed EF 55-60%, normal RV, unable to assess PA pressure (no TR jet), and nodular calcification of the mitral valve. RHC (10/23) with normal filling pressures, normal PA pressure and normal CO. No indication for Tyvaso. He is not volume overloaded on exam.  - Dyspnea like multifactorial with progressive IPF, deconditioning, and mild anemia  contributing to symptoms. ? Sleep study to rule out OSA. 3. IPF: Long-standing, has been on Esbriet.  4. CAD: Coronary calcification noted. LHC (10/23) with mild, non-obstructive CAD; mild LAD 40% stenosed.  - Continue ASA 81.  - Continue Zetia  Follow up 11/2022 with Dr. Aundra Dubin, as scheduled.  Maricela Bo [facility] FNP-BC 08/24/2022

## 2022-08-25 ENCOUNTER — Ambulatory Visit (HOSPITAL_COMMUNITY)
Admission: RE | Admit: 2022-08-25 | Discharge: 2022-08-25 | Disposition: A | Discharge status: Home / Self Care | Payer: PPO | Source: Ambulatory Visit | Attending: Family Medicine | Admitting: Family Medicine

## 2022-08-25 ENCOUNTER — Encounter (HOSPITAL_COMMUNITY): Payer: Self-pay

## 2022-08-25 ENCOUNTER — Inpatient Hospital Stay (HOSPITAL_COMMUNITY)
Admission: RE | Admit: 2022-08-25 | Discharge: 2022-08-25 | Disposition: A | Discharge status: Home / Self Care | Payer: PPO | Source: Ambulatory Visit | Attending: Cardiology | Admitting: Cardiology

## 2022-08-25 VITALS — BP 128/78 | HR 112 | Ht 69.0 in | Wt 166.6 lb

## 2022-08-25 DIAGNOSIS — I2510 Atherosclerotic heart disease of native coronary artery without angina pectoris: Secondary | ICD-10-CM | POA: Insufficient documentation

## 2022-08-25 DIAGNOSIS — Z8546 Personal history of malignant neoplasm of prostate: Secondary | ICD-10-CM | POA: Diagnosis not present

## 2022-08-25 DIAGNOSIS — E785 Hyperlipidemia, unspecified: Secondary | ICD-10-CM | POA: Diagnosis not present

## 2022-08-25 DIAGNOSIS — R0609 Other forms of dyspnea: Secondary | ICD-10-CM | POA: Insufficient documentation

## 2022-08-25 DIAGNOSIS — Z9221 Personal history of antineoplastic chemotherapy: Secondary | ICD-10-CM | POA: Diagnosis not present

## 2022-08-25 DIAGNOSIS — I493 Ventricular premature depolarization: Secondary | ICD-10-CM | POA: Insufficient documentation

## 2022-08-25 DIAGNOSIS — R000 Tachycardia, unspecified: Secondary | ICD-10-CM | POA: Insufficient documentation

## 2022-08-25 DIAGNOSIS — Z8572 Personal history of non-Hodgkin lymphomas: Secondary | ICD-10-CM | POA: Insufficient documentation

## 2022-08-25 DIAGNOSIS — Z79899 Other long term (current) drug therapy: Secondary | ICD-10-CM | POA: Insufficient documentation

## 2022-08-25 DIAGNOSIS — Z7982 Long term (current) use of aspirin: Secondary | ICD-10-CM | POA: Diagnosis not present

## 2022-08-25 DIAGNOSIS — J84112 Idiopathic pulmonary fibrosis: Secondary | ICD-10-CM | POA: Insufficient documentation

## 2022-08-25 DIAGNOSIS — Z923 Personal history of irradiation: Secondary | ICD-10-CM | POA: Diagnosis not present

## 2022-08-25 MED ORDER — METOPROLOL SUCCINATE ER 25 MG PO TB24: 25.0000 mg | ORAL_TABLET | Freq: Every day | ORAL | 8 refills | Status: DC

## 2022-08-25 NOTE — Patient Instructions (Addendum)
Thank you for coming in today  STOP Metoprolol START Toprol XL 25 mg daily  Your provider has recommended that  you wear a Zio Patch for 14 days.  This monitor will record your heart rhythm for our review.  IF you have any symptoms while wearing the monitor please press the button.  If you have any issues with the patch or you notice a red or orange light on it please call the company at [phone].  Once you remove the patch please mail it back to the company as soon as possible so we can get the results.   Your physician has requested that you have an echocardiogram. Echocardiography is a painless test that uses sound waves to create images of your heart. It provides your doctor with information about the size and shape of your heart and how well your heart's chambers and valves are working. This procedure takes approximately one hour. There are no restrictions for this procedure.   Your physician recommends that you schedule a follow-up appointment in:  3 months with Dr. Kendall Flack will receive a reminder letter in the mail a few months in advance. If you don't receive a letter, please call our office to schedule the follow-up appointment.       Do the following things EVERYDAY: Weigh yourself in the morning before breakfast. Write it down and keep it in a log. Take your medicines as prescribed Eat low salt foods--Limit salt (sodium) to 2000 mg per day.  Stay as active as you can everyday Limit all fluids for the day to less than 2 liters  At the [facility], you and your health needs are our priority. As part of our continuing mission to provide you with exceptional heart care, we have created designated Provider Care Teams. These Care Teams include your primary Cardiologist (physician) and Advanced Practice Providers (APPs- Physician Assistants and Nurse Practitioners) who all work together to provide you with the care you need, when you need it.   You may see any  of the following providers on your designated Care Team at your next follow up: Dr Glori Bickers Dr Loralie Champagne Dr. Roxana Hires, NP Lyda Jester, [place] [facility] [place], [place] Forestine Na, NP Audry Riles, PharmD   Please be sure to bring in all your medications bottles to every appointment.    Thank you for choosing [facility]   If you have any questions or concerns before your next appointment please send [place] a message through [place] or call our office at [phone].    TO LEAVE A MESSAGE FOR THE NURSE SELECT OPTION 2, PLEASE LEAVE A MESSAGE INCLUDING: YOUR NAME DATE OF BIRTH CALL BACK NUMBER REASON FOR CALL**this is important as we prioritize the call backs  YOU WILL RECEIVE A CALL BACK THE SAME DAY AS LONG AS YOU CALL BEFORE 4:00 PM

## 2022-08-25 NOTE — Progress Notes (Signed)
ReDS Vest / Clip - 08/25/22 0906       ReDS Vest / Clip   Station Marker C    Ruler Value 28    ReDS Value Range Low volume    ReDS Actual Value 36    Anatomical Comments sitting

## 2022-08-31 ENCOUNTER — Telehealth: Payer: Self-pay

## 2022-08-31 NOTE — Telephone Encounter (Signed)
     Patient  visit on 08/23/2022  at [facility] was for palpitations.  Have you been able to follow up with your primary care physician? Patient has followed up with a Cardiologist.  The patient was or was not able to obtain any needed medicine or equipment. No medication prescribed.  Are there diet recommendations that you are having difficulty following? No  Patient expresses understanding of discharge instructions and education provided has no other needs at this time. Yes   [place] Resource Care Guide   [name]  ?? [phone]   Website: [email or web address]

## 2022-09-09 DIAGNOSIS — M5412 Radiculopathy, cervical region: Secondary | ICD-10-CM | POA: Diagnosis not present

## 2022-09-14 DIAGNOSIS — I493 Ventricular premature depolarization: Secondary | ICD-10-CM | POA: Diagnosis not present

## 2022-09-15 ENCOUNTER — Ambulatory Visit (HOSPITAL_COMMUNITY)
Admission: RE | Admit: 2022-09-15 | Discharge: 2022-09-15 | Disposition: A | Discharge status: Home / Self Care | Payer: PPO | Source: Ambulatory Visit | Attending: Internal Medicine | Admitting: Internal Medicine

## 2022-09-15 DIAGNOSIS — I350 Nonrheumatic aortic (valve) stenosis: Secondary | ICD-10-CM | POA: Diagnosis not present

## 2022-09-15 DIAGNOSIS — I3481 Nonrheumatic mitral (valve) annulus calcification: Secondary | ICD-10-CM | POA: Insufficient documentation

## 2022-09-15 DIAGNOSIS — K219 Gastro-esophageal reflux disease without esophagitis: Secondary | ICD-10-CM | POA: Diagnosis not present

## 2022-09-15 DIAGNOSIS — E785 Hyperlipidemia, unspecified: Secondary | ICD-10-CM | POA: Diagnosis not present

## 2022-09-15 DIAGNOSIS — R0609 Other forms of dyspnea: Secondary | ICD-10-CM

## 2022-09-15 DIAGNOSIS — I509 Heart failure, unspecified: Secondary | ICD-10-CM | POA: Insufficient documentation

## 2022-09-15 LAB — ECHOCARDIOGRAM COMPLETE
AR max vel: 1.46 cm2
AV Area VTI: 1.43 cm2
AV Area mean vel: 1.52 cm2
AV Mean grad: 10.7 mmHg
AV Peak grad: 20.7 mmHg
Ao pk vel: 2.28 m/s
Area-P 1/2: 2.01 cm2
Calc EF: 64 %
S' Lateral: 3.3 cm
Single Plane A2C EF: 61.5 %
Single Plane A4C EF: 64.5 %

## 2022-09-15 NOTE — Progress Notes (Signed)
Echocardiogram 2D Echocardiogram has been performed.  Fidel Levy 09/15/2022, 12:07 PM

## 2022-09-17 ENCOUNTER — Telehealth (HOSPITAL_COMMUNITY): Payer: Self-pay | Admitting: Cardiology

## 2022-09-17 NOTE — Telephone Encounter (Signed)
During echo results pt had questions regarding recent pvc's and starting metoprolol Would like a call from provider

## 2022-09-22 ENCOUNTER — Telehealth (HOSPITAL_COMMUNITY): Payer: Self-pay | Admitting: Cardiology

## 2022-09-22 ENCOUNTER — Telehealth (HOSPITAL_COMMUNITY): Payer: Self-pay | Admitting: Family Medicine

## 2022-09-22 ENCOUNTER — Encounter (HOSPITAL_COMMUNITY): Payer: Self-pay | Admitting: Cardiology

## 2022-09-22 MED ORDER — METOPROLOL SUCCINATE ER 25 MG PO TB24: 25.0000 mg | ORAL_TABLET | Freq: Two times a day (BID) | ORAL | 11 refills | Status: DC

## 2022-09-22 NOTE — Telephone Encounter (Signed)
Patient called.  Patient aware. Copy of med changes/results sent to patient via my chart message and copy of report mailed to patient

## 2022-09-22 NOTE — Telephone Encounter (Signed)
Pt left message on triage line requesting call from provider regarding recent increase in PVC's + Zio results reviewed with patient Results also sent via mychart and printed copy mailed to patient   -pt would still like call from provider

## 2022-09-22 NOTE — Telephone Encounter (Signed)
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 He has felt fatigued since increasing his Toprol. Advised he increase further to bid dosing with his high PVC burden. Hopefully fatigue will subside, if not I asked him notify us. OK to continue to exercise. Will arrange home sleep study to look for OSA. If he is unable to tolerate increase dose of beta blocker, consider addition of mexiletine for PVC suppression and refer to EP. He is agreeable with plan and thankful for call.  Allena Katz, FNP-BC

## 2022-09-22 NOTE — Telephone Encounter (Signed)
-----   Message from Larey Dresser, MD sent at 09/20/2022  7:30 PM EDT ----- EF remains normal on echo but Zio shows 16% PVCs.  Need to increase Toprol XL to 25 mg bid.  Will continue to gradually increase over time, would like to avoid amiodarone with lung disease.  He also should get home sleep study as sleep apnea could contribute.

## 2022-09-25 ENCOUNTER — Other Ambulatory Visit: Payer: Self-pay | Admitting: Internal Medicine

## 2022-09-27 ENCOUNTER — Other Ambulatory Visit: Payer: Self-pay | Admitting: Nurse Practitioner

## 2022-09-29 NOTE — Telephone Encounter (Signed)
Message addressed by provider in second encounter

## 2022-09-30 ENCOUNTER — Other Ambulatory Visit: Payer: PPO

## 2022-09-30 ENCOUNTER — Ambulatory Visit: Payer: PPO | Admitting: Oncology

## 2022-10-05 ENCOUNTER — Other Ambulatory Visit (HOSPITAL_COMMUNITY): Payer: Self-pay | Admitting: *Deleted

## 2022-10-06 ENCOUNTER — Inpatient Hospital Stay: Payer: PPO | Attending: Oncology

## 2022-10-06 ENCOUNTER — Inpatient Hospital Stay: Payer: PPO | Admitting: Oncology

## 2022-10-06 VITALS — BP 117/85 | HR 84 | Temp 98.1°F | Resp 18 | Ht 69.0 in | Wt 162.8 lb

## 2022-10-06 DIAGNOSIS — C8330 Diffuse large B-cell lymphoma, unspecified site: Secondary | ICD-10-CM

## 2022-10-06 DIAGNOSIS — Z79899 Other long term (current) drug therapy: Secondary | ICD-10-CM | POA: Diagnosis not present

## 2022-10-06 DIAGNOSIS — C8331 Diffuse large B-cell lymphoma, lymph nodes of head, face, and neck: Secondary | ICD-10-CM | POA: Insufficient documentation

## 2022-10-06 DIAGNOSIS — C61 Malignant neoplasm of prostate: Secondary | ICD-10-CM | POA: Diagnosis not present

## 2022-10-06 DIAGNOSIS — D649 Anemia, unspecified: Secondary | ICD-10-CM | POA: Diagnosis not present

## 2022-10-06 LAB — CBC WITH DIFFERENTIAL (CANCER CENTER ONLY)
Abs Immature Granulocytes: 0.03 10*3/uL (ref 0.00–0.07)
Basophils Absolute: 0 10*3/uL (ref 0.0–0.1)
Basophils Relative: 0 %
Eosinophils Absolute: 0.4 10*3/uL (ref 0.0–0.5)
Eosinophils Relative: 9 %
HCT: 33.1 % — ABNORMAL LOW (ref 39.0–52.0)
Hemoglobin: 11.4 g/dL — ABNORMAL LOW (ref 13.0–17.0)
Immature Granulocytes: 1 %
Lymphocytes Relative: 16 %
Lymphs Abs: 0.8 10*3/uL (ref 0.7–4.0)
MCH: 31.4 pg (ref 26.0–34.0)
MCHC: 34.4 g/dL (ref 30.0–36.0)
MCV: 91.2 fL (ref 80.0–100.0)
Monocytes Absolute: 0.5 10*3/uL (ref 0.1–1.0)
Monocytes Relative: 11 %
Neutro Abs: 3 10*3/uL (ref 1.7–7.7)
Neutrophils Relative %: 63 %
Platelet Count: 231 10*3/uL (ref 150–400)
RBC: 3.63 MIL/uL — ABNORMAL LOW (ref 4.22–5.81)
RDW: 12.2 % (ref 11.5–15.5)
WBC Count: 4.8 10*3/uL (ref 4.0–10.5)
nRBC: 0 % (ref 0.0–0.2)

## 2022-10-06 LAB — CMP (CANCER CENTER ONLY)
ALT: 11 U/L (ref 0–44)
AST: 18 U/L (ref 15–41)
Albumin: 4.4 g/dL (ref 3.5–5.0)
Alkaline Phosphatase: 39 U/L (ref 38–126)
Anion gap: 8 (ref 5–15)
BUN: 11 mg/dL (ref 8–23)
CO2: 29 mmol/L (ref 22–32)
Calcium: 9.6 mg/dL (ref 8.9–10.3)
Chloride: 100 mmol/L (ref 98–111)
Creatinine: 0.66 mg/dL (ref 0.61–1.24)
GFR, Estimated: >60 mL/min (ref >60)
Glucose, Bld: 118 mg/dL — ABNORMAL HIGH (ref 70–99)
Potassium: 3.9 mmol/L (ref 3.5–5.1)
Sodium: 137 mmol/L (ref 135–145)
Total Bilirubin: 0.5 mg/dL (ref 0.3–1.2)
Total Protein: 6.9 g/dL (ref 6.5–8.1)

## 2022-10-06 LAB — LACTATE DEHYDROGENASE: LDH: 194 U/L — ABNORMAL HIGH (ref 98–192)

## 2022-10-06 NOTE — Progress Notes (Signed)
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  He reports a poor appetite.  He recently developed severe lower neck/upper back pain and saw Dr. Annette Stable and he underwent cervical spine discectomy/fusion surgery on 08/11/2022.  The pain resolved.  He has completed antiandrogen therapy for prostate cancer.  No fever, night sweats, or palpable lymph nodes. He has noted a recent increase in PVCs.  He was started on metoprolol by cardiology. Objective:  Vital signs in last 24 hours:  Blood pressure 117/85, pulse 84, temperature 98.1 F (36.7 C), temperature source Oral, resp. rate 18, height 5\' 9"  (1.753 m), weight 162 lb 12.8 oz (73.8 kg), SpO2 98 %.    HEENT: Healed anterior neck incision Lymphatics: No cervical, supraclavicular, axillary, or inguinal nodes Resp: Inspiratory rales at the right lower posterior chest, distant breath sounds, no respiratory distress Cardio: Regular rhythm with premature beats GI: No hepatosplenomegaly Vascular: No leg edema   Lab Results:  Lab Results  Component Value Date   WBC 4.8 10/06/2022   HGB 11.4 (L) 10/06/2022   HCT 33.1 (L) 10/06/2022   MCV 91.2 10/06/2022   PLT 231 10/06/2022   NEUTROABS 3.0 10/06/2022    CMP  Lab Results  Component Value Date   NA 137 10/06/2022   K 3.9 10/06/2022   CL 100 10/06/2022   CO2 29 10/06/2022   GLUCOSE 118 (H) 10/06/2022   BUN 11 10/06/2022   CREATININE 0.66 10/06/2022   CALCIUM 9.6 10/06/2022   PROT 6.9 10/06/2022   ALBUMIN 4.4 10/06/2022   AST 18 10/06/2022   ALT 11 10/06/2022   ALKPHOS 39 10/06/2022   BILITOT 0.5 10/06/2022   GFRNONAA >60 10/06/2022   GFRAA >60 06/01/2017    Medications: I have reviewed the patient's current medications.   Assessment/Plan: Non-Hodgkin's lymphoma CTs neck and chest 03/19/2021-cluster of left supraclavicular nodes measuring less than 1 cm, 12 mm right node next to the  jugular vein, chest with slight interval progression of pulmonary fibrosis, new small sclerotic lesion in the right third rib concerning for metastasis, lucent lesion in T8 corresponding to abnormal uptake on a bone scan 05/21/2020 suspicious for malignancy, subpectoral nodes-subcentimeter in size but larger than 03/28/2019 FNA of left neck node 03/25/2021-atypical lymphoid proliferation Excisional biopsy of level 5 left neck node 04/07/2021-lymph node with marked autolysis and diffusely infiltrated by atypical lymphocytes, 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 Cycle 1 R-mini CHOP 07/10/2021 Cycle 2 R-mini CHOP 07/31/2021 Cycle 3 R-mini CHOP 08/21/2021 Cycle 4 R-mini CHOP 09/11/2021 PET 09/29/2021-near complete resolution of hypermetabolic lymph nodes, no remaining hypermetabolic disease, wall thickening and hypermetabolic activity at the transverse  and sigmoid colon Cycle 5 R-mini CHOP  10/02/2021 Cycle 6 R-mini CHOP 10/23/2021, Udenyca 10/24/2021 Prostate cancer-T3 adenocarcinoma, Gleason 5+4, external beam XRT 09/12/2020 - 11/07/2020, maintained on androgen deprivation therapy 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  Anemia-chronic 7.  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  The LDH is lower today.  He has stable mild anemia, likely secondary to the course of chemotherapy and chronic disease.  He will return for an office visit in 3 months.  He will call for new symptoms.  [name], MD  10/06/2022  11:14 AM

## 2022-10-07 ENCOUNTER — Encounter (HOSPITAL_COMMUNITY)
Admission: RE | Admit: 2022-10-07 | Discharge: 2022-10-07 | Disposition: A | Discharge status: Home / Self Care | Payer: PPO | Source: Ambulatory Visit | Attending: Internal Medicine | Admitting: Internal Medicine

## 2022-10-07 ENCOUNTER — Telehealth (HOSPITAL_COMMUNITY): Payer: Self-pay | Admitting: Cardiology

## 2022-10-07 DIAGNOSIS — R400 Somnolence: Secondary | ICD-10-CM

## 2022-10-07 DIAGNOSIS — M810 Age-related osteoporosis without current pathological fracture: Secondary | ICD-10-CM | POA: Insufficient documentation

## 2022-10-07 MED ORDER — DENOSUMAB 60 MG/ML ~~LOC~~ SOSY
60.0000 mg | PREFILLED_SYRINGE | Freq: Once | SUBCUTANEOUS | Status: AC
  Administered 2022-10-07: 60 mg via SUBCUTANEOUS

## 2022-10-07 MED ORDER — DENOSUMAB 60 MG/ML ~~LOC~~ SOSY
PREFILLED_SYRINGE | SUBCUTANEOUS | Status: AC
  Filled 2022-10-07: qty 1

## 2022-10-07 NOTE — Telephone Encounter (Signed)
Per pre cert coordinator [place], CMA No pre cert required  Will notify patient  No answer unable to leave message

## 2022-10-07 NOTE — Telephone Encounter (Signed)
-WALK IN- Pt walked in with a request for home sleep study Stop bang complete and kit given to patient  Your provider has recommended that you have a home sleep study ([facility] Test).  We have provided you with the equipment in our office today. Please go ahead and download the app. DO NOT OPEN OR TAMPER WITH THE BOX UNTIL WE ADVISE YOU TO DO SO. Once insurance has approved the test our office will call you with PIN number and approval to proceed with testing. Once you have completed the test you just dispose of the equipment, the information is automatically uploaded to [place] via blue-tooth technology. If your test is positive for sleep apnea and you need a home CPAP machine you will be contacted by Dr Theodosia Blender office [facility]) to set this up.    Patient Name: Kenneth [name]       DOB: [birth year]      Height: 5'9"    Weight: 162lb  Office Name:[facility]         Referring Provider:Dalton Aundra Dubin, MD  Today's Date:10/07/2022    STOP BANG RISK ASSESSMENT S (snore) Have you been told that you snore?     NO   T (tired) Are you often tired, fatigued, or sleepy during the day?   YES  O (obstruction) Do you stop breathing, choke, or gasp during sleep? NO   P (pressure) Do you have or are you being treated for high blood pressure? NO   B (BMI) Is your body index greater than 35 kg/m? NO   A (age) Are you 81 years old or older? YES   N (neck) Do you have a neck circumference greater than 16 inches?   NO   G (gender) Are you a male? YES   TOTAL STOP/BANG "YES" ANSWERS 3                                                                       For Office Use Only              Procedure Order Form    YES to 3+ Stop Bang questions OR two clinical symptoms - patient qualifies for WatchPAT (CPT 95800)             Clinical Notes: Will consult Sleep Specialist and refer for management of therapy due to patient increased risk of Sleep Apnea. Ordering a sleep study due to the  following two clinical symptoms: Excessive daytime sleepiness G47.10 Unrefreshed by sleep G47.8   I understand that I am proceeding with a home sleep apnea test as ordered by my treating physician. I understand that untreated sleep apnea is a serious cardiovascular risk factor and it is my responsibility to perform the test and seek management for sleep apnea. I will be contacted with the results and be managed for sleep apnea by a local sleep physician. I will be receiving equipment and further instructions from [facility]. I shall promptly ship back the equipment via the included mailing label. I understand my insurance will be billed for the test and as the patient I am responsible for any insurance related out-of-pocket costs incurred. I have been provided with written instructions and can call for additional video  or telephonic instruction, with 24-hour availability of qualified personnel to answer any questions: Patient Help Desk [phone].  Patient Signature ______________________________________________________   Date______________________ Patient Telemedicine Verbal Consent

## 2022-10-07 NOTE — Addendum Note (Signed)
Addended by: Menno Vanbergen, Sharlot Gowda on: 10/07/2022 04:09 PM   Modules accepted: Orders

## 2022-10-09 ENCOUNTER — Ambulatory Visit (INDEPENDENT_AMBULATORY_CARE_PROVIDER_SITE_OTHER): Payer: PPO | Admitting: Internal Medicine

## 2022-10-09 DIAGNOSIS — J84112 Idiopathic pulmonary fibrosis: Secondary | ICD-10-CM

## 2022-10-09 LAB — PULMONARY FUNCTION TEST
DL/VA % pred: 94 %
DL/VA: 3.69 ml/min/mmHg/L
DLCO cor % pred: 55 %
DLCO cor: 12.81 ml/min/mmHg
DLCO unc % pred: 50 %
DLCO unc: 11.48 ml/min/mmHg
FEF 25-75 Pre: 2.84 L/sec
FEF2575-%Pred-Pre: 158 %
FEV1-%Pred-Pre: 77 %
FEV1-Pre: 2.01 L
FEV1FVC-%Pred-Pre: 120 %
FEV6-%Pred-Pre: 67 %
FEV6-Pre: 2.33 L
FEV6FVC-%Pred-Pre: 107 %
FVC-%Pred-Pre: 63 %
FVC-Pre: 2.33 L
Pre FEV1/FVC ratio: 86 %
Pre FEV6/FVC Ratio: 100 %

## 2022-10-09 NOTE — Progress Notes (Signed)
Spiro/DLCO performed today.  

## 2022-10-09 NOTE — Patient Instructions (Signed)
Spiro/DLCO performed today.  

## 2022-10-13 ENCOUNTER — Encounter: Payer: Self-pay | Admitting: Internal Medicine

## 2022-10-13 ENCOUNTER — Ambulatory Visit (INDEPENDENT_AMBULATORY_CARE_PROVIDER_SITE_OTHER): Payer: PPO | Admitting: Internal Medicine

## 2022-10-13 VITALS — BP 100/60 | HR 79 | Ht 69.0 in | Wt 164.4 lb

## 2022-10-13 DIAGNOSIS — Z5181 Encounter for therapeutic drug level monitoring: Secondary | ICD-10-CM

## 2022-10-13 DIAGNOSIS — M5412 Radiculopathy, cervical region: Secondary | ICD-10-CM | POA: Diagnosis not present

## 2022-10-13 DIAGNOSIS — T50905A Adverse effect of unspecified drugs, medicaments and biological substances, initial encounter: Secondary | ICD-10-CM

## 2022-10-13 DIAGNOSIS — J849 Interstitial pulmonary disease, unspecified: Secondary | ICD-10-CM

## 2022-10-13 DIAGNOSIS — R5383 Other fatigue: Secondary | ICD-10-CM

## 2022-10-13 DIAGNOSIS — I493 Ventricular premature depolarization: Secondary | ICD-10-CM

## 2022-10-13 DIAGNOSIS — R634 Abnormal weight loss: Secondary | ICD-10-CM | POA: Diagnosis not present

## 2022-10-13 DIAGNOSIS — J84112 Idiopathic pulmonary fibrosis: Secondary | ICD-10-CM

## 2022-10-13 NOTE — Patient Instructions (Addendum)
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HCC) Encounter for therapeutic drug monitoring Nausea due to drug  -  IPF slowly getting worse  - LFT normal April 2024  Plan  - continue pirfenidone - monitor pulse ox even with exertion - goal >= 88% - IPF PRO registry visit 04/07/2022 - next spirometry and dlco in 3 months  -nedt LFT check in 3 months   Fatigue - significant  in 2023 and on 10/13/2022 Weight loss - ongoing 10/13/2022 Nausea - some 10/13/2022    - though improved with distance from chemo and passage of time and exercise but now rerturned and likley due to esbriet/pirfenidone  Plan - reduce pirfenidone to 2 pills three times daily -> email me in 2 weeks how you are feeling and with weight - continue at this dose   PVCs  New this visit 10/13/2022 since surgery  Plan  - re-test ONO on room air to see if any new night hypxoemia could be contributing  Followup 2 to 4 weeks please email Dr Marchelle Gearing - re low dose pirfenidone - 3 months - 30 min visit with Dr Marchelle Gearing but after spiromety and dlco  - symptom score and walk test at followup

## 2022-10-13 NOTE — Progress Notes (Signed)
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 He tells me that this summer 2018*noticing insidious onset of shortness of breath particularly with walking stairs in the house or [place]. This was not that in the previous years. The summer 2018 he was in [place] but did not notice much shortness of breath. Otherwise he is able to do his activities of daily living and played golf with a car. He is more bothered by his back pain and sciatica as a result of previous back surgery. Shortness of breath is extremely mild. He says he became more aware of it after the radiologic investigations documented below. He had a chest x-ray that suggested interstitial findings and therefore he underwent a high-resolution CT scan of the chest that is described below. I personally visualized this high resolution CT chest and to me it shows bilateral bibasal subpleural reticulation that also extends to the upper lobes without any zonal predominance. There is no obvious honeycombing but there is traction bronchiectasis. There no mediastinal adenopathy. Therefore he is been referred here. In terms of exposure history other than radiation exposure he has not been on any pulmonary toxic drugs or any mold exposure or asbestos exposure. Does have GERD and is on PPI and is well ocntrolled   Does have spring and perennial allergies - sneezes easy. Allergy test with [facility] negative some years ago  [employer] chest physicians interstitial lung disease questionnaire: He says that he does not cough. He is only trouble with dyspnea with strenuous exercise and it started 4 months ago. Past medical history significant for acid reflux. He has never smoked any tobacco or street drugs. Does not have any family history of lung disease. At  home he does not have any humidifier sound birds heart the water damage or mold. 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 He has standard radiation exposure while at work. The standard radiation for a radiologist. He does not take any pulmonary toxic drugs. House is 81 years old and he worries about mold. He sneezes if air is forced   Pulmonary function test shows isolated reduction in diffusion capacity to 62%. Walking desaturation test underneath her feet 3 laps on room air: Resting pulse ox 96%. Final pulse ox 95%. Heart rate resting was 92/m and went up to 109 minute.   Results for TREVELLE, MCGURN (MRN [id number]) as of 03/23/2017 10:04  Ref. Range 03/04/2017 09:13  FVC-Pre Latest Units: L 3.59  FVC-%Pred-Pre Latest Units: % 86  Results for NATALIO, SALOIS (MRN [id number]) as of 03/23/2017 10:04  Ref. Range 03/04/2017 09:13  DLCO cor Latest Units: ml/min/mmHg 20.06  DLCO cor % pred Latest Units: % 63    Lungs/Pleura: There is subpleural reticulation with scattered ground-glass and traction bronchiolectasis, without a definite zonal predominance. No definitive honeycombing. Findings appear mildly progressive when radiographs dating back to 12/28/2008 are reviewed. Probable 5 mm subpleural lymph node along the right major fissure. No pleural fluid. Airway is unremarkable.      IMPRESSION: 1. Pulmonary parenchymal findings of interstitial lung disease which may be due to fibrotic nonspecific interstitial pneumonitis or, given slight progression over time, usual interstitial pneumonitis. 2. Aortic atherosclerosis (ICD10-170.0). Coronary artery calcification. 3. Aortic aneurysm NOS (ICD10-I71.9).  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 Recommend annual imaging followup by CTA or MRA. This recommendation follows 2010 ACCF/AHA/AATS/ACR/ASA/SCA/SCAI/SIR/STS/SVM Guidelines for the Diagnosis and Management of Patients with Thoracic  Aortic Disease. Circulation. 2010; 121: T614-E315. 4. Prominence of the right and left main pulmonary arteries can be seen with pulmonary arterial hypertension.     Electronically Signed   By: [name]   On: 02/17/2017 13:41      OV 03/23/2017  Chief Complaint  Patient presents with   Follow-up    SOB on exertion. Other than that he has been doing about the same. Denies any cough or CP.   Here to discuss results. [name]. 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 In inteirm no new symptoms.  Autoimmune profile negative. They raise possibility of 2nd opinion.  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 Progression is based on CXR comparison since 2010; there is no CT and is only suspicion of progression  Results for [name], [name] (MRN [id number]) as of 03/23/2017 10:04  Ref. Range 12/08/2011 16:44 03/04/2017 13:47  Anit Nuclear Antibody(ANA) Latest Ref Range: NEGATIVE   NEG  Angiotensin-Converting Enzyme Latest Ref Range: 9 - 67 U/L  31  Cyclic Citrullin Peptide Ab Latest Units: Units  <16  ds DNA Ab Latest Units: IU/mL  <1  ENA RNP Ab Latest Ref Range: 0.0 - 0.9 AI  0.2  Endomysial Screen Latest Ref Range: NEGATIVE  NEGATIVE   Deamidated Gliadin Abs, IgG Latest Ref Range: <20 U/mL 4.1   Gliadin IgA Latest Ref Range: <20 U/mL 1.8   RA Latex Turbid. Latest Ref Range: <14 IU/mL  <14  Tissue Transglut Ab Latest Ref Range: <20 U/mL 3.9   Tissue Transglutaminase Ab, IgA Latest Ref Range: <20 U/mL 2.2   IgE (Immunoglobulin E), Serum Latest Ref Range: <115 kU/L  31  IgA Latest Ref Range: 68 - 379 mg/dL 132   SSA (Ro) (ENA) Antibody, IgG Latest Ref Range: <1.0 NEG AI   <1.0 NEG  SSB (La) (ENA) Antibody, IgG Latest Ref Range: <1.0 NEG AI   <1.0 NEG    OV 05/06/2017  Chief Complaint  Patient presents with   Follow-up    Follow up today after beginning Esbriet. Pt has no c/o SOB,  cough, or CP. Has had some mild nausea from the [place] but other than that, he has been doing good on it.   ollow-up idiopathic pulmonary fibrosis mild severity  He is here to follow-up He is on the third week of his bed at 3 pills 3 times a day max dose. So far is tolerating it fine other than mild nausea. He does take after meals. He plans to meet with [facility] coordinator for medication support. He did visit [place] this past weekend and did have vomiting especially after consuming few etoh drinks at a [employer]. He does apply sunscreen [his daughter is a Paediatric nurse in [place]. He still complains about the fact that and is 82 year old home when the air conditioner comes on and they air blows out of the duct system he does sneeze 10 times and his feet does feel cold. She plans to put hepa filters that are high and and also get the duct cleaning. He says he did have allergy evaluation and found few years ago and it was negative. Recently he has increased his rpinrole and alsogot an injection  for his back and he does feel better   OV 06/22/2017  Chief Complaint  Patient presents with   Follow-up    Pt still taking Esbriet and has been doing good except has been having issues with nausea x3 days. PFT done today. Still becomes SOB with exertion. Denies any complaints of cough or CP.   IPF followup  Routine followup. Now on esbriet 3 pills tid since end oct 2018. Having new nausea - moderate, x 3 days. Might be chills. But no associated diarrhea or other side effects. Spacing esbriet 4h only. Also has complaitns about high co pay. Unable to find charity. Doing exrcise with trainer - feeels that is better and more vigorous than rehab. Gets tachy with eercise but says he does not desaturate. Had PFT - stable. Lot of questions about disease    Walking desaturation test on 06/22/2017 185 feet x 3 laps on ROOM AIR:  did NOT desaturate. Rest pulse ox was 100%, final pulse ox was 99%. HR  response 84/min at rest to 97/min at peak exertion. Patient BRICYN LABRADA  Did not Desaturate < 88% . Westley Chandler did not  Desaturated </= 3% points. Marylyn Ishihara A Ortego yes did get tachyardic   OV 09/20/2017  Chief Complaint  Patient presents with   Follow-up    PFT done today.  Pt states he has been doing good. Pt still taking Esbriet and states he is doing good on it.   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  Joen Laura for IPF follow-up.  Last visit December 2018.  Since then he has been compliant fully with his Pirfenidone (Esbriet).  His nausea resolved with Ginger intake.  However he is having 3-5 pound weight loss and also fatigue towards the end of the day and also low appetite.  He is exercising vigorously 5 times a week and he is wondering if the fatigue could be because of the heavy exercise.  He is not interested in lung transplantation.  He is open to research participation in interventional trial.  Currently he is on the IPF registry program.  Overall he is well.  He did go to [place] to visit his son and he did not wear sunscreen and he did pick up a stage I skin burn with erythema that was more than usual.  But this resolved.  I did remind him about his obligation to wear sunscreen at all times with Pirfenidone (Esbriet).  He is interested in interventional research trials.  His pulmonary function test to me on an average is stable even though there is decline in FVC that seems to be an increase in DLCO so that is variability.  His walking desaturation test is around the same. He is interested in rolling over to the larger capsule of the Pirfenidone (Esbriet) so we will have to take it only 3 times a day. ESberit is cposting him $500/month or more due to high AGI and this is frustrating for him   Walking desaturation test on 09/20/2017 185 feet x 3 laps on ROOM AIR:  did walk briskly all 3 laps with only mild dyspnea desaturate. Rest pulse ox was 98%, final pulse ox was 98%. HR response 96/min at rest to  107/min at peak exertion. Patient SHAMERE CAMPAS  Did not Desaturate < 88% . Westley Chandler did not  Desaturated </= 3% points. Guillermina [place] Kops yes did get tachyardic this appears similar to a few months ago   OV 12/07/2017  Chief Complaint  Patient  presents with   Follow-up    Pt had pre Arlyce Harman and DLCO prior to OV today. Pt has increase of SOB with exertion more with walking up the hill.   Dr. Annamaria Boots presents for follow-up of his IPF to ILD clinic.  In terms of his IPF he feels stable.  In fact he tells me that if not for the incidental chest x-ray and a subsequent CT scan that picked up pulmonary fibrosis he even to this day will not know that he has IPF.  He only has a mild exertional dyspnea class I which she always believes in the past that it was because of aging.  At this point in time he is on Esbriet 1 big pill 3 times daily at full dose.  He is now applying sunscreen and has not had any further sunburns.  He is following an extremely intense exercise fitness regimen to improve his cardiac conditioning.  He believes his exertional heart rate and resting heart rate are much improved than before.  He works with a Clinical research associate at [employer].  His main issue is from Pirfenidone [facility]).  He had some mild nausea that has now resolved with ginger capsules but he continues to have some amount of fatigue low appetite and some mild weight loss.  He says this is mild and all tolerable.  The fatigue happens after working a lot in the yard and he loves yard work.  He wants to know if he can continue to do yard work.  Recently he did have some sinus congestion and took some steroids and antibiotics for it and he feels better but he still has some sinus fullness.  He had spirometry and simple walking desaturation test and the profile is below and I believe these are stable.  In fact his exertional heart rate is improved compared to previous.  Today he also participated in the IPF registry program.  There is a  noninterventional trial.  He is interested in future interventional trials.   OV 03/08/2018  Chief Complaint  Patient presents with   Follow-up    PFT performed today. Pt states he has been doing well except he has been having problems with nausea and dizziness, and loss of appetite.   Westley Chandler - presents for follow-up. For his IPF. He is on esbruet now. He was tolerating his Pirfenidone (Esbriet) quite well other than minor side effects till last visit. However in thesummer of 2019e's had increased nauseaand also weight loss. He has lost 6 pounds since prior visit. Overall he is lost 11 pounds since September 2018. We started the medication esbeit  anti-fibrotic for him in October 2018. His pants are getting looser.he is also having significant fatigue. Nevertheless he does not want to switch to the of anti-fibrotic ofev because of prior history of irritable bowel syndrome. He says that his baseline irritable bowel syndrome is worse than any side effects he is having with esbriet especially the fatigue.e is open to a transplant evaluation and consideration of research protocols. His spirometry shows stability with FVC but his DLCO might be declining.Marland Kitchen His simple walk test did not show anychange. He is not noticing any subjective changes in dyspnea on effort tolerance while working out although he does admit to increased dizziness particularly when playing golf. During this time recently has not monitored his pulse ox but in the past and never went below 92%.  Other issues: He had some myalgia despite change in his statin. He will  address this with his primary care. 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 Primary care has ordered a follow-up CT scan.This can be combined with a high risk CT scan. I I have reached out to  the thoracic radiology     OV 05/10/2018  Subjective:  Patient ID: Westley Chandler, male , DOB: 1941/01/18 , age 89 y.o. , MRN: [id number] , ADDRESS: [place] [address]   05/10/2018 -   Chief Complaint  Patient presents with   Follow-up    review spiro with dlco.  c/o stable doe.  On 885m Esbriet TID, notes occ dizziness, loss of appetite.      HPI KWAYLAND BAIK89y.o. -returns-year follow-up.  Diagnosis made towards the end of 2018.  He has been on Esbriet since then.  His main issues have been weight loss.  He lost 10 pounds of weight at the time of last visit.  At the last visit he weighed 174 pounds in September 2019.  Back in August 2018 he weighed 186 pounds.  All this weight loss happen after he started Pirfenidone (Esbriet).  However he is now stabilized and he continues to be 874 pounds.  He is able to do activities of daily living and exercise but when he does stairs he gets dyspneic.  Overall he feels stable in the last 1 year in terms of his effort tolerance.  His main thing is that he has no appetite.  He has been communicating with the Pirfenidone (Esbriet) supporting about how to manage his low appetite and occasional nausea.  We discussed the options about taking brief holidays or reduction in dose.  He is willing to try this.  He does take occasional ginger.  Of note he had [facility] because he was out of network.  He tells me that his insurance company only advised that he would have a higher co-pay so is a little bit puzzled by this.  Since then he said conversation with his primary care physician and he wants to see Dr. DShana Chutewho is a son-in-law to his friend Dr. BLindwood Quaretired sPsychologist, sport and exercise  I know Dr. GLauris Chromanquite well from an IPF standpoint.  And therefore wholeheartedly supported the idea.  In terms of his lung function testing his FVC shows a decline although he is feeling the same.  On miniature walking test he seems a little more tachycardic but he says he feels the same.  He manages this heart rate with his exercise.  His  DLCO with some variability appears stable.  He had a CT angiogram for thoracic aortic aneurysm in September 2019 and this when compared to one year earlier has been reported as stable but the different resolutions.  I offered to do a high-resolution CT chest here but he wants to hold off until he saw Dr. GLauris Chroman  OV 08/23/2018  Subjective:  Patient ID: KWestley Chandler male , DOB: 406-15-1942, age 82y.o. , MRN: [id number], [place] [id number]  08/23/2018 -   Chief Complaint  Patient presents with   Follow-up    PFT performed today. Pt states he has been doing well since last visit and denies any complaints.   IPF followup"   Diagnosis made towards the end of 2018.  He has been on Esbriet since then.   HPI KKeldon Lassen  Lasseigne 82 y.o. -Dr. Yang presents for follow-up of his IPF.  He continues to feel good.  His main issue is tolerating the Esbriet which gives some fatigue.  Sometimes he has to postpone it.  In terms of his dyspnea he continues to exercise.  His dyspnea symptom score is really minimal.  His cough is nonexistent.  He was losing weight with Esbriet but now he is gained his weight back.  In the interim he did see Dr. Daniel Gilstrap June 23, 2018 at [facility].  He had a satisfactory visit.  He thought he was stable.  His next visit with Dr. Gilstrap will be in June 2020.  Moving forward he wants to alternate every 6 months between myself and Dr. Gilstrap at [facility] which will give him 3 months with 1 pulmonologist or the other.  He is beginning to get interested in research studies.  We did pulmonary function test on him today and is documented below.  The FVC appears a fluctuant but the DLCO definitely appears on a downward trend.  The new DLCO is on a global lung initiated [GL I] equation and therefore the percent predicted is higher.  But the absolute value showed downward trend line.  We went over this.  He definitely does not feel this subjectively.  His  last high-resolution CT scan of the chest was in August of 2018.  He is willing to have one at follow-up.  He wants to see me again in 6 months.  His simple walking desaturation test appears stable.  He is thinking of making a trip to [place] and wants to know if he should wear a mask.    OV 04/21/2019  Subjective:  Patient ID: Khari A Betancur, male , DOB: 04/03/1941 , age 78 y.o. , MRN: [id number] , ADDRESS: [address] [place] [address]   04/21/2019 -   Chief Complaint  Patient presents with   Follow-up    Patient reports that his breathing is doing well at this time.    IPF followup. On esbriet  HPI Mike A Filsinger 82 y.o. -presents for follow-up of his idiopathic pulmonary fibrosis.  Last visit was pre-pandemic in February 2018.  In the interim he did see Dr. Daniel Gilstrap at [facility] and deemed to be stable.  At this point in time he is reporting continued stability with the symptoms as seen by the score below.  However I did notice that he is lost lot of weight.  His BMI with his clothes on is 25 with a weight of 170 pounds.  He says his dry weight is actually around 162 pounds.  He feels that this is the stable weight in the last 6 months.  He says he is not bothered by it.  He says this is because of low appetite because of the and pirfenidone.  He feels the pirfenidone is benefiting him and he continues to be is to be stable.  In fact his pulmonary function test shows " improvement".  He said that he felt the test today was somewhat variable and is effort based on the technician.  He had a recent high-resolution CT chest that shows continued stability.  I personally visualized this film.  At this point in time he is happy taking the pirfenidone.  But he did agree if there is further weight loss into the 150s pounds then we could reassess  Had a lot of discussion about COVID-19 and about appropriate risk reduction measures which   he is continuing to do.  He did some read some  literature about COVID-19 and had questions about it. He in Vit D supplementation and is agreeable to get his levels checked following literature about protective effect of Vit D  Other than that he reports 2 episodes of dizziness and in one of those he nearly passed out.  He was wondering if some of the symptoms were related to ropinirole and therefore he has stopped it.  I reviewed the literature with him and the significant amount of dizziness and hypotension.  And also GI symptoms which can probably accentuated with pirfenidone.   OV 10/16/2019  Subjective:  Patient ID: Westley Chandler, male , DOB: 1941-06-05 , age 89 y.o. , MRN: [id number] , ADDRESS: [place] [address]   10/16/2019 -   Chief Complaint  Patient presents with   Follow-up    PFT performed 3/31.  Pt states he is doing okay. States his breathing is about the same since last visit.     HPI KYMARI NUON 82 y.o.  -follow-up IPF on pirfenidone    -ROS   70-monthfollow-up for Dr. YAnnamaria Boots  He continues on pirfenidone.  He is taking full dose.  In the interim he has had hernia surgery 6 weeks ago for right inguinal hernia.  This is gone well.  He is change his exercise from bicycle to walking because of the hernia.  His symptom score is the same.  [facility] 2021 [facility]  Deemed to be stable.  I reviewed that note.  After that he has had pulmonary function test with uKoreacouple weeks ago.  This shows a decline.  He says the quality of instructions was poor and therefore the decline is because of that.  At least that is his suspicion.  He feels well.  Walking desaturation test is stable.  He has gained some weight.  He continues to have some nausea and fatigue and diarrhea with the pirfenidone.  But this is stable and manageable.  He feels his irritable bowel syndrome is active because of the pirfenidone.  He plans to see Dr. JJoanell Risingfor that.  His last liver  function test was with Dr. GLauris Chromanin January 2021.  This was normal per the note.  His last CT scan was towards the end of 2020 and deemed to be stable.     OV 04/11/2020   Subjective:  Patient ID: KWestley Chandler male , DOB: 4November 11, 1942 age 82y.o. years. , MRN: [id number]  [place] [address] [id number]  PCrist Infante MD Providers : Dr GAlisia Ferrari Treatment Team:  Attending Provider: RBrand Males MD   Chief Complaint  Patient presents with   Follow-up    PFT performed today.  Pt states he has been doing well since last visit and denies any complaints.    Follow-up idiopathic pulmonary fibrosis.  Diagnosis towards the end of 2018.  On pirfenidone.  Last CT scan of the chest September 2020.   HPI KMANOLO BOSKET89y.o. -presents for follow-up last seen in April 2021.  After that in July 2021 he [facility]  I reviewed the notes.  The feeling was that he was stable.  He is only minimally symptomatic.  He had a 6-minute walk test and did really well without any significant desaturations.  He is now resorted  to high intensity interval training based on advice from a physical therapist/gym instructor.  He feels like stronger and failure.  He also states that his appetite is better and is eating better.  He feels his weight is stable.  In terms of his IPF symptoms is minimal and documented below.  He just has mild irritable bowel syndrome with his Esbriet and baseline health.  His walking desaturation test today in the office is stable.  His pulmonary function test was reviewed.  Over the last 3 years it shows a slight steady decline.  This decline is both in FVC and DLCO.  He is averaging around 2.6% decline per year with FVC while 8% decline in the DLCO per year.  On an average with a 3% decline per year in the last 3 years.  We did discuss this.  We discussed #pulmonary fibrosis foundation support group -he will visit the  website.  He gets newsletters from them.  At this point in time he is not interested in joining the support group but he did get the contact information for this and will reach out if he desires so.  #-We also discussed research as a care option -we discussed trial sponsored by [place] [facility] of pirfenidone.  The trial is called [employer].  The investigation medical product is called PRM-[id number] in the axilla macrophage pathway.  Promising phase 2 data.  Research team gave him the consent form under my delegation.  He understands research is voluntary and is to evaluate unproven therapies.  He understands that the primary intent is to contribute to her scientific development and therapeutic gain is secondary because of an approved product the risks benefit ratio is very different compared to blood products.  He is going to read the consent and think about potential participation.  I sent him via email medical journal articles about the investigational product    He tells me that he feels his weight is stable.  His dry weight early in the morning is between 162-163 pounds.  I pointed out that his weight today is 168 pounds and is lower than before.  However he says that the difference rating 168-172 is related to clothing and his weight at home is stable.  He is not concerned about the slight weight loss.  Overall while he agrees he is lost weight since starting Esbriet he feels recently has been stable.  He wants to continue to monitor the situation  Irritable bowel: He said he will check with Dr. Henrene Pastor about this  Other issues:  - He wants me to check his iron panel -He will do follow-up of his IPF-registry visit today  -for research-  -He has had his Covid booster    OV 10/22/2020  Subjective:  Patient ID: Westley Chandler, male , DOB: 01-20-1941 , age 28 y.o. , MRN: [id number] , ADDRESS: [place] [address] PCP [name], MD Patient Care Team: [name], MD as  PCP - General (Internal Medicine) Cira Rue, RN as Oncology Nurse Navigator  This Provider for this visit: Treatment Team:  Attending Provider: Brand Males, MD    10/22/2020 -   Chief Complaint  Patient presents with   Follow-up    Fatigue from radiation treatments, more winded and more GI upset from radiation treatments    Follow-up idiopathic pulmonary fibrosis.  Diagnosis towards the end of 2018.  On pirfenidone.  Last CT scan of the chest September 2020.    HPI Marylyn Ishihara  A Eidem 82 y.o. -returns for follow-up.  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  He is finished hormonal treatment and now is on radiation treatment.  Radiation treatment is aggravating his GI side effects from Pirfenidone (Esbriet) and from irritable bowel syndrome.  Is also causing fatigue.  He went August a masters in walk the tournament.  This made him more fatigued than what he experienced few years ago.  He said it took him a few days to recover.  He used to have weight loss but this is now stabilized it is documented below.  His symptom severity score and his walking desaturation test are stable but his pulmonary function test continues to show decline.  His DLCO shows a relatively more decline but he said there were technique issues today.  He is got upcoming appointment with ILD clinic at [facility] in July.  At this point in time he is interested in phase 3 clinical trials.  We have quite a few to off as of summer 2022 but his prostate cancer could be an exclusion.  We will have the research team evaluate.  He is saddened by the loss of his friend Dr. March Rummage.  He is evaluating whether he should join the pulmonary fibrosis foundation at the national level to be a board member.     OV 04/03/2021  Subjective:  Patient ID: Westley Chandler, male , DOB: Dec 14, 1940 , age 86 y.o. , MRN: [id number] , ADDRESS: [place] [address] PCP [name], MD Patient Care Team: [name], MD as PCP - General (Internal Medicine) Cira Rue, RN as Oncology Nurse Navigator  This Provider for this visit: Treatment Team:  Attending Provider: Brand Males, MD    04/03/2021 -   Chief Complaint  Patient presents with   Follow-up    Just performed a partial PFT, patient states increased SOB.    Follow-up idiopathic pulmonary fibrosis.  Diagnosis towards the end of 2018.  On pirfenidone.  Last CT scan of the chest September 2020. - > sept 2022  Underlying irritable bowel syndrome  Initial weight loss with pirfenidone and then stabilized  HPI Westley Chandler 82 y.o. -Dr. Annamaria Boots presents for his IPF follow-up.  Since I last saw him he has had several health issues.  He got diagnosed with prostate cancer and then had radiation to that.  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  He is going to have a colonoscopy.  He feels his irritable bowel syndrome pirfenidone and ultimately the radiation made his GI symptoms significantly worse.  In order to combat the fatigue he has been undergoing physical therapy.  He also try to get back to golf and took a golf swing for practice and then states that he had a compression fracture in the spine.  This is probably also because of years of steroid injections in his back.  In addition when he had a high-resolution CT chest in September 2022 this month there was some neck nodes and he has had a neck CT scan followed by ENT visit to [name].  Apparently fine-needle aspiration was nondiagnostic.  He is scheduled to undergo excision biopsy.  The combination of all this is gotten a little bit more anxious and depressed.  As you can see his symptom scores for depression and anxiety are up.  He is also feeling a little more short of breath than usual.  He  says his pulse ox is in the low 90s when he exerts himself but it still adequate.  Review of his symptoms and objective basis shows his symptom score  to be the same.  His walking desaturation test is also the same.  His weight is stable.  His pulmonary function test shows stability in diffusion but a decline in FVC compared to earlier this year.  Had a high-resolution CT chest that only shows minimal progression fibrosis over 2 years.  Echocardiogram September 2022 did not show any evidence of pulmonary hypertension     CLINICAL DATA:  Pulmonary fibrosis.   EXAM: CT CHEST WITHOUT CONTRAST   TECHNIQUE: Multidetector CT imaging of the chest was performed following the standard protocol without intravenous contrast. High resolution imaging of the lungs, as well as inspiratory and expiratory imaging, was performed.   COMPARISON:  03/28/2019, 03/30/2018 and 02/27/2017. Bone scan 05/21/2020.   FINDINGS: Cardiovascular: Atherosclerotic calcification of the aorta, aortic valve and coronary arteries. Enlarged pulmonic trunk and right and left main pulmonary arteries. Heart is at the upper limits of normal in size. No pericardial effusion.   Mediastinum/Nodes: Low left internal jugular lymph nodes measure up to 8 mm (2/8), previously 3 mm on 03/28/2019. Mediastinal lymph nodes are subcentimeter in short axis size, as before. Hilar regions are difficult to definitively evaluate without IV contrast. Left axillary lymph nodes measure up to 9 mm, are unchanged and not considered enlarged by CT size criteria. High left subpectoral lymph node measures 7 mm (2/11), increased from 4 mm on 03/28/2019. No right axillary adenopathy. Esophagus is grossly unremarkable. Periesophageal lymph nodes are subcentimeter in short axis size and appears similar.   Lungs/Pleura: Peripheral and basilar predominant subpleural reticulation, ground-glass and traction bronchiectasis/bronchiolectasis, minimally progressive from 03/28/2019. Evidence of progression is best seen in the lower lung zones. No pleural fluid. Airway is unremarkable. No air trapping.    Upper Abdomen: Visualized portions of the liver, gallbladder, adrenal glands, kidneys, spleen, pancreas, stomach and bowel are unremarkable.   Musculoskeletal: Degenerative changes in the spine. There is a new small rounded sclerotic lesion in the lateral aspect of the right third rib (sagittal image 32). New lucent lesion in the left posterolateral aspect of the T8 vertebral body measuring approximately 1.3 x 1.7 x 2.1 cm (2/84 and sagittal image 96). This likely corresponds to abnormal uptake seen on bone scan 05/21/2020. Finding is new from 03/28/2019. Slight compression of the T12 superior endplate is new.   IMPRESSION: 1. Slight interval progression of pulmonary fibrosis. Findings are consistent with UIP per consensus guidelines: Diagnosis of Idiopathic Pulmonary Fibrosis: An Official ATS/ERS/JRS/ALAT Clinical Practice Guideline. [place], Iss 5, [phone], Mar 06 2017. 2. New small sclerotic lesion in the lateral aspect of the right third rib, worrisome for metastatic prostate cancer. 3. Lucent lesion in the left posterolateral aspect of the T8 vertebral body corresponds to abnormal uptake on bone scan 05/21/2020, and is new from CT chest 03/28/2019, raising suspicion for malignancy. 4. Left internal and high left subpectoral lymph nodes are subcentimeter in size but appear slightly larger than on 03/28/2019. Difficult to exclude a lymphoproliferative disorder. Please refer to CT neck with contrast done the same day in further evaluation. 5. Slight compression of the T12 superior endplate is new but age indeterminate. 6. Aortic atherosclerosis (ICD10-I70.0). Coronary artery calcification. 7. Enlarged pulmonary arteries, indicative of pulmonary arterial hypertension.     Electronically Signed   By: [name]  On: 03/19/2021 15:12   Xxxxx  IMPRESSION: Neck CT September 2022 Mildly prominent lymph nodes in the neck bilaterally left  greater than right. Cluster of left supraclavicular and left posterior lymph nodes all under 1 cm.   Largest lymph node in the neck is a right level 3 lymph node measuring 12 mm on axial image 75.     Electronically Signed   By: Franchot Gallo M.D.   On: 03/19/2021 15:24   Xxxx IMPRESSIONS echocardiogram September 2022    1. Left ventricular ejection fraction, by estimation, is 65 to 70%. The  left ventricle has normal function. The left ventricle has no regional  wall motion abnormalities. Left ventricular diastolic parameters are  consistent with Grade I diastolic  dysfunction (impaired relaxation).   2. Right ventricular systolic function is normal. The right ventricular  size is normal. Tricuspid regurgitation signal is inadequate for assessing  PA pressure.   3. Left atrial size was mildly dilated.   4. The mitral valve is abnormal. Trivial mitral valve regurgitation. No  evidence of mitral stenosis.   5. The aortic valve is tricuspid. There is mild calcification of the  aortic valve. There is moderate thickening of the aortic valve. Aortic  valve regurgitation is not visualized. Mild aortic valve stenosis. Aortic  valve area, by VTI measures 1.62 cm.  Aortic valve mean gradient measures 12.0 mmHg. Aortic valve Vmax measures  2.31 m/s. DI is 0.43.   6. Aortic dilatation noted. There is borderline dilatation of the aortic  root, measuring 39 mm. There is borderline dilatation of the ascending  aorta, measuring 39 mm.   7. The inferior vena cava is normal in size with <50% respiratory  variability, suggesting right atrial pressure of 8 mmHg.   Comparison(s): Changes from prior study are noted. 03/02/2017: LVEF 60-65%,  mild LVH, grade 1 DD, aortic root 39 mm, no aortic stenosis.   OV 06/17/2021  Subjective:  Patient ID: Westley Chandler, male , DOB: 19-Nov-1940 , age 30 y.o. , MRN: [id number] , ADDRESS: 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     HPI MARRELL DICAPRIO 82 y.o. 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  Since his last visit from ILD standpoint he continues to be stable.  His walking desaturation test is stable.  However he is reporting more shortness of breath but this is directly related to more fatigue.  Review of the labs indicate in October 2022 he had mild anemia and also mild hyponatremia.  In the past radiation therapy for prostate made him extremely tired.  He is also on pirfenidone that can cause tiredness.  He now has lymphoma also that can cause tiredness.  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  He seen Dr. Kavin Leech.  They are planning to start R-CHOP treatment after Christmas.  He is frustrated by the recurrence of various medical illnesses.  The second cancer since his IPF diagnosis.  In terms of his IPF: He is now on generic pirfenidone.  He feels the nausea is worse.  He wants to switch to branded Esbriet.  Gave him donor sample of Esbriet by [employer].  Lot number [id number] with expiration June 2025 [unopened bottle], lot number [id number] with expiration 05/2023 [unopened bottle and also lot number [id number] with expiration 12/2023 [partial bottle].  He is asked me to send a message to the pharmacist to make the switch.  He says even if it is more expensive he will play for the branded Esbriet because it is better quality of life.  He is worried that nausea and other side effects will get worse  particularly once he starts chemotherapy.   No results found.   OV 09/18/2021  Subjective:  Patient ID: Westley Chandler, male , DOB: 09/10/1940 , age 52 y.o. , MRN: [id number] , ADDRESS: [place] [address] PCP [name], MD Patient Care Team: [name], MD as PCP - General (Internal Medicine) Cira Rue, RN as Oncology Nurse Navigator O'Kelley, Mindi Slicker, RN as Oncology Nurse Navigator  This Provider for this visit: Treatment Team:  Attending Provider: Brand Males, MD  Follow-up idiopathic pulmonary fibrosis.  Diagnosis towards the end of 2018.  On pirfenidone.  Last CT scan of the chest September 2020. - > sept 2022  Underlying irritable bowel syndrome  Initial weight loss with pirfenidone and then stabilized  History of prostate cancer  New diagnosis of B-cell lymphoma cervical lymphadenopathy -late 2022  Multifactorial fatigue  - mild anemia OCt 2022  - mild hypontermia OCt 2022  09/18/2021 -   Chief Complaint  Patient presents with   Follow-up    PFT performed today.  Pt states he is about the same since last visit. States that he is currently undergoing chemotherapy due to diagnosis of lymphoma.     HPI VICENT FEBLES 81 y.o. -returns for follow-up.  He is taking Esbriet instead of generic pirfenidone.  He is also taking 3 little pills 3 times daily.  He finds that Esbriet to be better than generic pirfenidone.  He finds 3 little pills better than 1 whole pill in terms of nausea.  Still is dealing with a lot of nausea both from the chemo and Esbriet.  He says yesterday was really bad day with nausea.  He does take Zofran but we discussed and said he will try taking the Zofran 30 to 60 minutes before the Esbriet.  Nevertheless he has been able to gain some weight.  He is actually feeling slightly less short of breath.  He is able to do more particularly after his back pain has resolved.  Nevertheless he is on antiprostate cancer therapy.  He  is also undergoing chemotherapy from his B-cell lymphoma.  His mood is better.  He says he is socially isolating significantly because of his pulm fibrosis and cancer history.  He wanted to know if he could go out.  He did go to [employer] for show but wore a mask.  I did indicate to him that he could follow the CDC map to see prevalence of respiratory viruses and when the problem is overall low he could take more risk.  Also like being outdoors is less risky.  If he is endorsing particularly a cluster putting a mask on reduces his risk.  But I did say that he could use these guardrails and be more active so he can get some socialization and help his mood.  Can also give him purpose.  He is now sold his house and is planning to move into [facility].  In terms of his walking desaturation test and symptoms he stable.  In terms of his pulmonary function test his DLCO is stable but his FVC is down.  He is feeling similar.  Most of the decline seems to be from several years ago but most recently appears to be stable overall. We did discuss clinical trials but cancer generally is an exclusion but otherwise he would be interested.  We did discuss potential for doing inhaled treprostinil trial.  We did discuss the fact the neck step and standard of care would be oxygen if he were to decline but so far he has held good.  He recently lost his friend [facility] who also had pulmonary fibrosis.     He has upcoming [employer] appointment but he missed the 1 earlier in the year because of all ischemia.  He is thinking about holding off on going to [facility] and just following here.  I was fine with this.   OV 10/28/2021  Subjective:  Patient ID: Westley Chandler, male , DOB: 1941/06/02 , age 49 y.o. , MRN: [id number] , ADDRESS: [place] [address] PCP [name], MD Patient Care Team: [name], MD as PCP - General (Internal Medicine) Cira Rue, RN as Oncology Nurse  Navigator O'Kelley, Mindi Slicker, RN as Oncology Nurse Navigator  This Provider for this visit: Treatment Team:  Attending Provider: Brand Males, MD    10/28/2021 -   Chief Complaint  Patient presents with   Acute Visit    Pt states since his last chemo treatment, he has been having problems with his breathing and also states that his pulse has been elevated.     Westley Chandler 82 y.o. -presents with his wife.  Status meeting his wife for the first time but she reminded me that she had made him early on in the course of his illness.  He is finished cycles of R-CHOP treatment for his lymphoma.  On 10/23/2021 Thursday he saw a nurse practitioner in oncology.  At that point it was noticed that he had finished mini CHOP treatment 5 rounds 10/02/2021.  He did complete his 6 cycle.  He was feeling well.  His resting heart rate was 82.  Pulse ox at rest was 100%.  His white count was observed to be slightly low.  They discussed several options and decided to proceed with chemotherapy followed by [facility] single dose on 10/24/2021 Friday.  He was doing fairly okay then on Sunday, 10/26/2021 he noticed resting tachycardia heart rate 125 but no fever.  At no point any fever.  No cough no chills no nausea no vomiting no diarrhea.  The same thing persisted even on Monday, 10/27/2021.  He noticed that if he walked his heart rate was up to 140 he became short of breath and stop.  His pulse ox would drop to 90% but no abnormal desaturations.  He started getting worried.  He went to [employer] yesterday 10/27/2021.  EKG reviewed sinus tachycardia with some ectopic atrial beats.  Nothing acute.    Labs: Found to have mild hyponatremia.  Also found to have mild anemia white count in response to Udenyca went from low to  high.No D-dimer.  No troponin no BNP checked.  Last echo and CT scan of the chest in September 2022.  Walking desaturation test is definitely  more tachycardic than baseline.  Associated with this he  is also got exaggerated pulse ox drop although still adequate for simple walking test.  His pace is slower.   We repeated an EKG here: Personally visualized.  Sinus tachycardia heart rate 120 approximately.  Nothing acute.      OV 01/09/2022  Subjective:  Patient ID: Westley Chandler, male , DOB: [birth year] , age 25 y.o. , MRN: [id number] , ADDRESS: [address] [place] [facility] [address] PCP [name], MD Patient Care Team: [name], MD as PCP - General (Internal Medicine) Cira Rue, RN as Oncology Nurse Navigator O'Kelley, Mindi Slicker, RN as Oncology Nurse Navigator  This Provider for this visit: Treatment Team:  Attending Provider: Brand Males, MD    01/09/2022 -   Chief Complaint  Patient presents with   Follow-up    PFT performed today. Pt states that he feels like he may be a little worse since last visit. States that he has had some problems with congestion which he wonders could be due to the weather.   HPI TRINTON PREWITT 82 y.o. 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  At this point in time he is finished his chemotherapy.  In April 2023 he did have some tachycardic response following the last chemo.  Cardiac work-up ended up being fine.  He says overall he is been feeling fine.  He is now relocated to a retirement home in [place].  [place]  During this time his pulse ox immediately after exertion was always in the 90s.  He did not feel that his IPF was getting worse when he was there.  He just came back yesterday.  However for the last few days he is having increased fatigue nasal congestion.  He also feels his left ear is blocked.  He tried to do pulmonary function test today.  He felt he did not give a good effort particularly the DLCO.  Consistent with this his numbers are much worse.  His FVC shows a 7% decline compared to March 2023 and DLCO 14% decline compared to March 2023.  His symptom score seems similar  to September 2022.  His walking desaturation test is also stable on this level ground.  His weight loss is stabilized since he is at 168 pounds.  He is frustrated by this.  Is also frustrated by his multiple health issues.  The pirfenidone he is taking the generic version.  He is tolerating it but is requiring Zofran which then causes constipation.  Emotionally he is looking for a break from all his health issues  He plans [place] today.     01/26/2022: Today - follow up Patient presents today for follow up after being treated for sinusitis. He reports feeling better but still has issues with nasal congestion and pressure in his left ear. They did improve with prednisone taper. These are problems he has been dealing with for a while now. He has tried multiple different nasal sprays with some relief. 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 He is sure this was the result of his worsening pulmonary function testing. He is working on getting in to see [name] with ENT. Otherwise, no concerns today. Breathing is overall stable. He gets winded with stair climbing but otherwise, no issues. Doesn't feel like his activity is limited by his breathing. He's planning to go  back [place] the mountains in the next few weeks.     OV 04/07/2022  Subjective:  Patient ID: Kenneth [name], male , DOB: 1941-02-19 , age 88 y.o. , MRN: [id number] , ADDRESS: [address] [place] [facility] [address] PCP Rodrigo Ran, MD Patient Care Team: Rodrigo Ran, MD as PCP - General (Internal Medicine) Felicita Gage, RN as Oncology Nurse Navigator O'[place]elley, Jacquelyne Balint, RN as Oncology Nurse Navigator  This Provider for this visit: Treatment Team:  Attending Provider: Kalman Shan, MD   04/07/2022 -   Chief Complaint  Patient presents with   Follow-up    Spiro/DLCO done today. Breathing has been gradually worse since the last visit. He has had some muscle fatigue with exertion. He has some occ  cough- non prod.      HPI VLAD MAYBERRY 82 y.o. -returns for follow-up.  He feels he is slowly progressing in terms of his IPF.  In fact his shortness of breath scores are slightly worse.  Last time his pulmonary function test showed a decline he thought was from sinusitis.  Sinusitis is cleared up but he says today when he did his PFTs he felt that he was declining.  He did have some cough he is having some new cough for the last few weeks.  He is worried this might be from worsening IPF but he typically gets cough this time of the year from fall allergy season.  Pulmonary function test shows an improvement compared to the most recent one but overall compared to earlier this year there is a decline.  I shared these results with him.  He is more worried about his fatigue.  However he did play some golf.  He says that he is significantly fatigued.  He has been going on for 6 to 12 months initially thought was related to cancer and chemotherapy but he says it is the all the time.  The fatigue persist despite anemia correction despite his chemotherapy completing is still got fatigue.  He feels it happens episodically but then it is there all over the body and it exhausting.  He believes his primary care is checked his vitamin D levels because he is on replacement.  He believes his TSH and hemoglobin A1c to be normal.  Apparently primary care physician has asked him to consider getting a CT coronaries.  He has seen Dr. Marca Ancona in the past.  At the time it was from acute chemotherapy consideration.  Dr. Shirlee Latch at the time got an echocardiogram.  He does have enlarged pulmonary artery on the CT scan and he does have coronary artery calcification.  Back then in the spring during chemo side effects patient had deferred right heart catheterization.  This time of reach out to Dr. Shirlee Latch   He is due for an IPF-Pro registry visit today    OV 06/23/2022  Subjective:  Patient ID: Kenneth [name], male , DOB:  Nov 19, 1940 , age 27 y.o. , MRN: [id number] , ADDRESS: [address] [place] [facility] [id number] PCP Rodrigo Ran, MD Patient Care Team: Rodrigo Ran, MD as PCP - General (Internal Medicine) Felicita Gage, RN as Oncology Nurse Navigator O'Kelley, Jacquelyne Balint, RN as Oncology Nurse Navigator  This Provider for this visit: Treatment Team:  Attending Provider: Kalman Shan, MD    06/23/2022 -   Chief Complaint  Patient presents with   Follow-up    Pt states he has been doing okay since last visit.     HPI  Kenneth [name] 82 y.o. 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  Overall he is in a better state of health.  His fatigue is improved.  His shortness of breath is also improved somewhat.  He continues pirfenidone at full dose.  His last chemotherapy was in J May 2023.  His last androgen deprivation treatment was in July 2023.  He believes the chemo and the androgen deprivation was causing his fatigue.  With the passage of time he is better.  [place] is now living in [facility].  He is doing daily exercise.  He says that he can go well on a flat ground but when he does uphill sometimes he has to stop but most of the time he is doing well.  He does workout with exercise bike.  He states his tachycardia has improved with more more physical conditioning.  However he does get fatigued and short of breath when he tries to play golf.  His GI issues in combination of irritable bowel syndrome and pirfenidone continue but it is mild level.  He says 2023 has been a rough year and has been emotional.  Therefore primary care physician started him on Pristiq on the advice of his daughter.  He says since then he somewhat better.  He did have sinusitis a few weeks ago and now he is better.  Take prednisone and cephalosporin.  Social note: We talked about masking particularly with the surgery for respiratory viruses.  On July 25, 2022 he is going to get the Carren Rang award for coverage at [facility].  He is  going to talk about pulmonary fibrosis.  I encouraged him to be an advocate for pulmonary fibrosis.  He said in the past is The diagnosis private.  I did tell him that given the serious nature of the disease and rare disease I would encourage him to be as open as he wants to so there is more awareness and advocacy for the disease.  I also advised him to mask for this gathering to the extent possible as a risk reduction against respiratory viruses.    OV 10/13/2022  Subjective:  Patient ID: Kenneth [name], male , DOB: Nov 06, 1940 , age 56 y.o. , MRN: [id number] , ADDRESS: [address] [place] [facility] [id number] PCP Rodrigo Ran, MD Patient Care Team: Rodrigo Ran, MD as PCP - General (Internal Medicine) Felicita Gage, RN as Oncology Nurse Navigator O'Kelley, Jacquelyne Balint, RN as Oncology Nurse Navigator  This Provider for this visit: Treatment Team:  Attending Provider: Kalman Shan, MD   Follow-up idiopathic pulmonary fibrosis.  Diagnosis towards the end of 2018.  On pirfenidone.  Last CT scan of the chest September 2020. 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  Depression and started on Pristiq by primary care physician in 2023  10/13/2022 -   Chief Complaint  Patient presents with   Follow-up    F/up  HPI JSON KOELZER 82 y.o. -returns for follow-up.  On 08/11/2022 he underwent elective neck surgery by Dr. Dutch Quint because of cervical spondylosis and radiculopathy.  Since  then the pain is resolved.  After this on 08/23/2022 he ended up with tachycardia in the ED.  Pulmonary embolism ruled out.  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  He is on beta-blocker.  He says he is a little more short of breath doing things around the house.  He is also having significant PVCs.  He is on Toprol.  His chronic anemia continues.  The PVC issue is new he is frustrated by this.  He had pulmonary function test today and it shows a definite decline compared to a year ago.  He is also lost weight with full dose pirfenidone.  He continues have significant fatigue.  Is also reporting some new nausea.  I did indicate to him that pirfenidone is the most likely culprit for the trial of weight loss fatigue and nausea.  We discussed taking a holiday with the pirfenidone but we took a shared decision making to reduce the dose and monitor.  In addition for his PVCs need to see if his ILD has gotten worse.  Which it has on the pulmonary function test.  He has agreed to get a overnight pulse oximetry study done.  He is somewhat despondent because of all his medical issues.       SYMPTOM SCALE - ILD 08/23/2018  04/21/2019  10/16/2019  04/11/2020 168# 10/22/2020 170# - esbriet and XRT for prostate 04/03/2021 168# 06/17/2021 168# lymphoma 09/18/2021 172# 01/09/2022 168# 04/07/2022 173# 06/23/2022 171#  10/13/2022 164# esbiret  O2 use ra e ra   ra ra ra ra ra ra ra  Shortness of Breath 0 -> 5 scale with 5 being worst (score 6 If unable to do)             At rest 0 0  Simple tasks - showers, clothes change, eating, shaving *0** 1  Household (dishes, doing bed, laundry) 1 2  Shopping 1 2  Walking level at own pace 1 2  Walking up Stairs Total (40 - 48) Dyspnea Score How bad is your cough? 0 0  How bad is your fatigue 1 due to  esbriet 0 2 0 2 1 3.5 2 3 3 2  2.5  nausea    vomit    diarrhea   0 anxiety    depresso0          Simple office walk 185 feet x  3 laps goal with forehead probe 12/07/2017 Weight 180# 03/08/2018 Weight 174.9 05/10/2018 Weight 174# 08/23/2018 Weight 178# 04/21/2019 Weight 170# 10/16/2019 173# 04/11/2020 168# 10/22/2020  170# 04/03/2021 168# 06/17/2021 168# 09/18/2021  10/28/2021  01/09/2022 168# 06/23/2022 171#  O2 used Room air Room air Room air Room air Room air Room air   ra ra ra  ra ra  Number laps completed 3 3 3 3 3 3   3  3      Comments about pace Moderate pace Mod pace Mod fast pace fast  fast   fast   Avg pace - slower than before    Resting Pulse Ox/HR 100% and 83/min 100% ad 85/min 100% and 91/min 100% and 82/min  100% asnd 91 100% and 83/min 98% and 94/min 100% and 82 100% and 93 100% and 78 99% and H 126 99% and HR 88 100% ahd HR 87  Final Pulse Ox/HR 99% and 98/min 98$ and 104/min 98% and 108/min 98% and 100/min  96% and 106/mom 98% and 96/mn 95% and 110.min 97% and 105 97% ad 109 97% and 94 92% and HR 143 97% and HR 111 96% and HR 110  Desaturated </= 88% no no no no  no          Desaturated <= 3% points no no ni no  yesm 4 points  Yes, 3 points Yes, 3 poitn Yes 3 pomnts Yes 3 Yes, 7 points  Yes 4 ponts  Got Tachycardic >/= 90/min yes yes yes yes  yes  yes yes yes yes yes    Symptoms at end of test No complaints No complaints  No complaints  Mild dyspnea  no Mild dyspnea Mild dyspnea Mild dysnea Modeate dyspnea  No complaints  Miscellaneous comments x ? sligh increase in tachy/stable ? incraeased tachty       Stable since April 2021         PFT     Latest Ref Rng & Units 10/09/2022    4:21 PM 04/07/2022    1:46 PM 01/09/2022    9:17 AM 09/18/2021    8:52 AM 04/03/2021    9:21 AM 10/21/2020    8:56 AM 04/11/2020   10:04 AM  PFT Results  FVC-Pre L 2.33  P 2.53  P 2.49  2.68  2.96   3.20  3.26   FVC-Predicted Pre % 63  P 68  P 64  68  75  81  82   Pre FEV1/FVC % % 86  P 86  P 87  84  81  81  80   FEV1-Pre L 2.01  P 2.18  P 2.16  2.25  2.38  2.59  2.62   FEV1-Predicted Pre % 77  P 83  P 78  80  85  91  92   DLCO uncorrected ml/min/mmHg 11.48  P 12.36  P 12.55  13.07  16.15  13.64  17.26   DLCO UNC% % 50  P 53  P 53  54  67  56  72   DLCO corrected ml/min/mmHg 12.81  P 13.57  P 12.55  14.64  16.15  13.64  17.26   DLCO COR %Predicted % 55  P 59  P 53  61  67  56  72   DLVA Predicted % 94  P 89  P 91  105  91  80  94     P Preliminary result       has a past medical history of Arthritis, Depression, Diffuse large B cell lymphoma (2023), Diverticulosis of colon (without mention of hemorrhage), GERD (gastroesophageal reflux disease), Hiatal hernia, HLD (hyperlipidemia), Internal  hemorrhoids without mention of complication, Irritable bowel syndrome, Other specified gastritis without mention of hemorrhage, Prostate cancer (2022), Pulmonary fibrosis, and Stress fracture (02/2021).   reports that he has never smoked. He has never used smokeless tobacco.  Past Surgical History:  Procedure Laterality Date   ANTERIOR CERVICAL DECOMP/DISCECTOMY FUSION N/A 08/11/2022   Procedure: Anterior Cervical Decompression/Discectomy Fusion - Cervical Four-Cervical Five -  Cervical Five-Cervical Six - Cerivcal Six-Cerival Seven;  Surgeon: Julio Sicks, MD;  Location: [facility] OR;  Service: Neurosurgery;  Laterality: N/A;   CARPAL TUNNEL RELEASE     left   CATARACT EXTRACTION  06/2011   bilateral   DEEP NECK LYMPH NODE BIOPSY / EXCISION  03/25/2021   INGUINAL HERNIA REPAIR Right 09/05/2019   Procedure: OPEN REPAIR RIGHT INGUINAL HERNIA WITH MESH;  Surgeon: Darnell Level, MD;  Location: [facility] ORS;  Service: General;  Laterality: Right;   LUMBAR DISC SURGERY  1986, 1995   x 2   LUMBAR LAMINECTOMY/DECOMPRESSION MICRODISCECTOMY Left 07/11/2013   Procedure: LUMBAR ONE TO TWO, LUMBAR TWO TO THREE, LUMBAR  THREE TO FOUR LUMBAR LAMINECTOMY/DECOMPRESSION MICRODISCECTOMY 3 LEVELS;  Surgeon: Temple Pacini, MD;  Location: [facility] NEURO ORS;  Service: Neurosurgery;  Laterality: Left;   NASAL SINUS SURGERY     RIGHT/LEFT HEART CATH AND CORONARY ANGIOGRAPHY N/A 04/14/2022   Procedure: RIGHT/LEFT HEART CATH AND CORONARY ANGIOGRAPHY;  Surgeon: Laurey Morale, MD;  Location: [facility] INVASIVE CV LAB;  Service: Cardiovascular;  Laterality: N/A;    No Known Allergies  Immunization History  Administered Date(s) Administered   Fluad Quad(high Dose 65+) 03/18/2020   Influenza Split 10/03/2009, 03/28/2010, 04/14/2011, 03/22/2012, 03/09/2013, 03/28/2014   Influenza, High Dose Seasonal PF 03/19/2016, 03/06/2017, 04/09/2018, 03/07/2019, 04/06/2020, 04/11/2022   Influenza, Quadrivalent, Recombinant, Inj, Pf 03/12/2018, 03/10/2019, 04/06/2020   Influenza,inj,Quad PF,6+ Mos 04/04/2014, 03/26/2015, 04/05/2016   Influenza-Unspecified 04/05/2016, 04/05/2021   PFIZER Comirnaty(Gray Top)Covid-19 Tri-Sucrose Vaccine 10/15/2020   PFIZER(Purple Top)SARS-COV-2 Vaccination 07/20/2019, 08/10/2019, 02/27/2020, 03/23/2021   Pneumococcal Conjugate-13 03/30/2013, 07/31/2013, 04/26/2018   Pneumococcal Polysaccharide-23 10/03/2009, 01/14/2010, 03/31/2018, 02/18/2021   Pneumococcal-Unspecified 04/05/2016   Td 10/20/2016   Tdap 10/03/2009   Zoster Recombinat (Shingrix) 01/03/2017   Zoster, Live 10/03/2009    Family History  Problem Relation Age of Onset   Heart failure Father 74   Bladder Cancer Mother    Breast cancer Mother    Colon cancer Neg Hx    Pancreatic cancer Neg Hx    Rectal cancer Neg Hx    Stomach cancer Neg Hx    Prostate cancer Neg Hx      Current Outpatient Medications:    acetaminophen (TYLENOL) 500 MG tablet, Take 1,000 mg by mouth every 6 (six) hours as needed for moderate pain., Disp: , Rfl:    Bempedoic Acid (NEXLETOL) 180 MG TABS, Take 180 mg by mouth daily., Disp: , Rfl:    bismuth subsalicylate (PEPTO  BISMOL) 262 MG/15ML suspension, Take 30 mLs by mouth every 6 (six) hours as needed for diarrhea or loose stools or indigestion., Disp: , Rfl:    carboxymethylcellulose (REFRESH TEARS) 0.5 % SOLN, Place 1 drop into both eyes daily as needed (dry eyes)., Disp: , Rfl:    Cholecalciferol (VITAMIN D-3) 1000 UNITS CAPS, Take 2,000 Units by mouth every evening., Disp: , Rfl:    CO-ENZYME Q-10 PO, Take 1 tablet by mouth every evening. , Disp: , Rfl:    Cyanocobalamin (VITAMIN B12) 1000 MCG TBCR, Take 1,000 mcg by mouth daily., Disp: , Rfl:    denosumab (  PROLIA) 60 MG/ML SOSY injection, Inject 60 mg into the skin every 6 (six) months. [facility], Disp: , Rfl:    desvenlafaxine (PRISTIQ) 50 MG 24 hr tablet, Take 50 mg by mouth daily., Disp: , Rfl:    diclofenac (VOLTAREN) 75 MG EC tablet, Take 75 mg by mouth 2 (two) times daily as needed (pain.). (Patient not taking: Reported on 10/06/2022), Disp: , Rfl: 2   diclofenac sodium (VOLTAREN) 1 % GEL, Apply 4 g topically 4 (four) times daily as needed (pain.). (Patient not taking: Reported on 10/06/2022), Disp: , Rfl: 12   dicyclomine (BENTYL) 10 MG capsule, Take one by mouth every 6 hours as needed for abdominal cramping, Disp: 30 capsule, Rfl: 1   diphenoxylate-atropine (LOMOTIL) 2.5-0.025 MG tablet, Take 1 tablet by mouth 4 (four) times daily as needed for diarrhea or loose stools., Disp: , Rfl:    Emollient (AQUAPHOR ADV PROTECT HEALING EX), Apply 1 Application topically daily as needed (itching)., Disp: , Rfl:    Fluticasone Propionate (XHANCE) 93 MCG/ACT EXHU, Place 1 spray into both nostrils 2 (two) times daily as needed (sinus)., Disp: , Rfl:    Glucosamine Sulfate 500 MG TABS, Take 500 mg by mouth daily., Disp: , Rfl:    HYDROcodone-acetaminophen (NORCO/VICODIN) 5-325 MG tablet, Take 1 tablet by mouth every 4 (four) hours as needed for moderate pain ((score 4 to 6))., Disp: 30 tablet, Rfl: 0   latanoprost (XALATAN) 0.005 % ophthalmic solution, Place  1 drop into both eyes at bedtime., Disp: , Rfl:    loratadine (CLARITIN) 10 MG tablet, Take 10 mg by mouth daily., Disp: , Rfl:    meclizine (ANTIVERT) 25 MG tablet, Take 25 mg by mouth 3 (three) times daily as needed for dizziness. , Disp: , Rfl:    methocarbamol (ROBAXIN) 500 MG tablet, Take 500 mg by mouth every 8 (eight) hours as needed (severe muscle spasms)., Disp: , Rfl:    metoprolol succinate (TOPROL XL) 25 MG 24 hr tablet, Take 1 tablet (25 mg total) by mouth 2 (two) times daily., Disp: 60 tablet, Rfl: 11   Multiple Vitamin (MULTIVITAMIN) tablet, Take 1 tablet by mouth daily., Disp: , Rfl:    omeprazole (PRILOSEC) 40 MG capsule, TAKE 1 CAPSULE (40 MG TOTAL) BY MOUTH DAILY., Disp: 90 capsule, Rfl: 0   ondansetron (ZOFRAN) 8 MG tablet, Take 1 tablet (8 mg total) by mouth every 8 (eight) hours as needed for nausea or vomiting., Disp: 30 tablet, Rfl: 1   Pirfenidone (ESBRIET) 801 MG TABS, Take 1 tablet by mouth 3 (three) times daily with meals., Disp: 270 tablet, Rfl: 1   Probiotic Product (ALIGN) 4 MG CAPS, Take 4 mg by mouth daily., Disp: , Rfl:       Objective:   Vitals:   10/13/22 1318  BP: 100/60  Pulse: 79  SpO2: 97%  Weight: 164 lb 6.4 oz (74.6 kg)  Height: 5\' 9"  (1.753 m)    Estimated body mass index is 24.28 kg/m as calculated from the following:   Height as of this encounter: 5\' 9"  (1.753 m).   Weight as of this encounter: 164 lb 6.4 oz (74.6 kg).  @WEIGHTCHANGE @  [employer]   10/13/22 1318  Weight: 164 lb 6.4 oz (74.6 kg)     Physical Exam General: No distress. Looks  a biotpulled down Neuro: Alert and Oriented x 3. GCS 15. Speech normal Psych: Pleasant Resp:  Barrel Chest - no.  Wheeze - no, Crackles - yes, No  overt respiratory distress CVS: Normal heart sounds. Murmurs - no Ext: Stigmata of Connective Tissue Disease - no HEENT: Normal upper airway. PEERL +. No post nasal drip        Assessment:       ICD-10-CM   1. ILD (interstitial lung  disease)  J84.9 Pulmonary function test    2. IPF (idiopathic pulmonary fibrosis)  J84.112     3. Encounter for therapeutic drug monitoring  Z51.81     4. Other fatigue  R53.83     5. Drug-induced weight loss  R63.4    T50.905A     6. PVC (premature ventricular contraction)  I49.3          Plan:     Patient Instructions  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HCC) Encounter for therapeutic drug monitoring Nausea due to drug  -  IPF slowly getting worse  - LFT normal April 2024  Plan  - continue pirfenidone - monitor pulse ox even with exertion - goal >= 88% - IPF PRO registry visit 04/07/2022 - next spirometry and dlco in 3 months  -nedt LFT check in 3 months   Fatigue - significant  in 2023 and on 10/13/2022 Weight loss - ongoing 10/13/2022 Nausea - some 10/13/2022    - though improved with distance from chemo and passage of time and exercise but now rerturned and likley due to esbriet/pirfenidone  Plan - reduce pirfenidone to 2 pills three times daily -> email me in 2 weeks how you are feeling and with weight - continue at this dose   PVCs  New this visit 10/13/2022 since surgery  Plan  - re-test ONO on room air to see if any new night hypxoemia could be contributing  Followup 2 to 4 weeks please email Dr Marchelle Gearing - re low dose pirfenidone - 3 months - 30 min visit with Dr Marchelle Gearing but after spiromety and dlco  - symptom score and walk test at followup    SIGNATURE    Dr. Kalman Shan, M.D., F.C.C.P,  Pulmonary and Critical Care Medicine Staff Physician, [facility] Center Director - Interstitial Lung Disease  Program  Pulmonary Fibrosis [facility] at [facility] [place], [place], [address]  Pager: [phone], If no answer or between  15:00h - 7:00h: call [phone] Telephone: [phone]  1:50 PM 10/13/2022

## 2022-10-13 NOTE — Addendum Note (Signed)
Addended by: Hedda Slade on: 10/13/2022 02:20 PM   Modules accepted: Orders

## 2022-10-14 ENCOUNTER — Encounter (HOSPITAL_COMMUNITY): Payer: PPO

## 2022-10-19 DIAGNOSIS — J8417 Interstitial lung disease with progressive fibrotic phenotype in diseases classified elsewhere: Secondary | ICD-10-CM | POA: Diagnosis not present

## 2022-10-27 ENCOUNTER — Telehealth: Payer: Self-pay | Admitting: Internal Medicine

## 2022-10-27 NOTE — Telephone Encounter (Signed)
Overnight pulse oximetry done for Dr. [name] on 10/11/2022.  Was 8 hours and 28-minute test.  Time spent in pulse ox less than equal to 88% was 0 minutes.  The lowest pulse ox was only 89%.  Time spent on less than or equal to 89% was 2 seconds  Plan - Does not need nighttime oxygen

## 2022-10-29 DIAGNOSIS — E785 Hyperlipidemia, unspecified: Secondary | ICD-10-CM | POA: Diagnosis not present

## 2022-10-29 DIAGNOSIS — K589 Irritable bowel syndrome without diarrhea: Secondary | ICD-10-CM | POA: Diagnosis not present

## 2022-10-29 DIAGNOSIS — E538 Deficiency of other specified B group vitamins: Secondary | ICD-10-CM | POA: Diagnosis not present

## 2022-10-29 DIAGNOSIS — C61 Malignant neoplasm of prostate: Secondary | ICD-10-CM | POA: Diagnosis not present

## 2022-10-29 DIAGNOSIS — I493 Ventricular premature depolarization: Secondary | ICD-10-CM | POA: Diagnosis not present

## 2022-10-29 DIAGNOSIS — I272 Pulmonary hypertension, unspecified: Secondary | ICD-10-CM | POA: Diagnosis not present

## 2022-10-29 DIAGNOSIS — R7301 Impaired fasting glucose: Secondary | ICD-10-CM | POA: Diagnosis not present

## 2022-10-29 DIAGNOSIS — I2510 Atherosclerotic heart disease of native coronary artery without angina pectoris: Secondary | ICD-10-CM | POA: Diagnosis not present

## 2022-10-29 DIAGNOSIS — C8510 Unspecified B-cell lymphoma, unspecified site: Secondary | ICD-10-CM | POA: Diagnosis not present

## 2022-10-29 DIAGNOSIS — J84112 Idiopathic pulmonary fibrosis: Secondary | ICD-10-CM | POA: Diagnosis not present

## 2022-10-29 DIAGNOSIS — M1990 Unspecified osteoarthritis, unspecified site: Secondary | ICD-10-CM | POA: Diagnosis not present

## 2022-10-29 DIAGNOSIS — I712 Thoracic aortic aneurysm, without rupture, unspecified: Secondary | ICD-10-CM | POA: Diagnosis not present

## 2022-11-07 ENCOUNTER — Encounter (INDEPENDENT_AMBULATORY_CARE_PROVIDER_SITE_OTHER): Payer: PPO | Admitting: Cardiology

## 2022-11-07 DIAGNOSIS — G4733 Obstructive sleep apnea (adult) (pediatric): Secondary | ICD-10-CM | POA: Diagnosis not present

## 2022-11-08 ENCOUNTER — Ambulatory Visit: Payer: PPO | Attending: Cardiology

## 2022-11-08 DIAGNOSIS — R400 Somnolence: Secondary | ICD-10-CM

## 2022-11-08 NOTE — Procedures (Unsigned)
SLEEP STUDY REPORT Patient Information Study Date: 11/07/2022 Patient Name: Kenneth [name] Patient ID: [id number] Birth Date: 01-May-1941 Age: 82 Gender: Male BMI: 24.2 (W=163 lb, H=5' 9'') Referring Physician: Marca Ancona, MD  TEST DESCRIPTION: Home sleep apnea testing was completed using the WatchPat, a Type 1 device, utilizing peripheral arterial tonometry (PAT), chest movement, actigraphy, pulse oximetry, pulse rate, body position and snore. AHI was calculated with apnea and hypopnea using valid sleep time as the denominator. RDI includes apneas, hypopneas, and RERAs. The data acquired and the scoring of sleep and all associated events were performed in accordance with the recommended standards and specifications as outlined in the AASM Manual for the Scoring of Sleep and Associated Events 2.2.0 (2015).   FINDINGS:   1. Mild Obstructive Sleep Apnea with AHI 6.3/hr.   2. No Central Sleep Apnea with pAHIc 0.3/hr.   3. Oxygen desaturations as low as 90%.   4. Mild snoring was present. O2 sats were < 88% for 0 min.   5. Total sleep time was 5 hrs and 5 min.   6. 21.2% of total sleep time was spent in REM sleep.   7. Normal sleep onset latency at 19 min.   8. Shortened REM sleep onset latency at 39 min.   9. Total awakenings were 7.  10. Arrhythmia detection:  Normal.  DIAGNOSIS: Mild Obstructive Sleep Apnea (G47.33)  RECOMMENDATIONS:   1.  Clinical correlation of these findings is necessary.  The decision to treat obstructive sleep apnea (OSA) is usually based on the presence of apnea symptoms or the presence of associated medical conditions such as Hypertension, Congestive Heart Failure, Atrial Fibrillation or Obesity.  The most common symptoms of OSA are snoring, gasping for breath while sleeping, daytime sleepiness and fatigue.   2.  Initiating apnea therapy is recommended given the presence of symptoms and/or associated conditions. Recommend proceeding with one of the  following:     a.  Auto-CPAP therapy with a pressure range of 5-20cm H2O.     b.  An oral appliance (OA) that can be obtained from certain dentists with expertise in sleep medicine.  These are primarily of use in non-obese patients with mild and moderate disease.     c.  An ENT consultation which may be useful to look for specific causes of obstruction and possible treatment options.     d.  If patient is intolerant to PAP therapy, consider referral to ENT for evaluation for hypoglossal nerve stimulator.   3.  Close follow-up is necessary to ensure success with CPAP or oral appliance therapy for maximum benefit.  4.  A follow-up oximetry study on CPAP is recommended to assess the adequacy of therapy and determine the need for supplemental oxygen or the potential need for Bi-level therapy.  An arterial blood gas to determine the adequacy of baseline ventilation and oxygenation should also be considered.  5.  Healthy sleep recommendations include:  adequate nightly sleep (normal 7-9 hrs/night), avoidance of caffeine after noon and alcohol near bedtime, and maintaining a sleep environment that is cool, dark and quiet.  6.  Weight loss for overweight patients is recommended.  Even modest amounts of weight loss can significantly improve the severity of sleep apnea.  7.  Snoring recommendations include:  weight loss where appropriate, side sleeping, and avoidance of alcohol before bed.  8.  Operation of motor vehicle should be avoided when sleepy.  Signature: Armanda Magic, MD; [facility]; Diplomat, American Board of Sleep Medicine Electronically Signed: 11/08/2022  10:48:13 AM

## 2022-12-01 ENCOUNTER — Encounter (HOSPITAL_COMMUNITY): Payer: Self-pay | Admitting: Cardiology

## 2022-12-01 ENCOUNTER — Ambulatory Visit (HOSPITAL_COMMUNITY)
Admission: RE | Admit: 2022-12-01 | Discharge: 2022-12-01 | Disposition: A | Discharge status: Home / Self Care | Payer: PPO | Source: Ambulatory Visit | Attending: Cardiology | Admitting: Cardiology

## 2022-12-01 VITALS — BP 110/70 | HR 79 | Wt 168.2 lb

## 2022-12-01 DIAGNOSIS — I5022 Chronic systolic (congestive) heart failure: Secondary | ICD-10-CM

## 2022-12-01 DIAGNOSIS — Z79899 Other long term (current) drug therapy: Secondary | ICD-10-CM | POA: Diagnosis not present

## 2022-12-01 DIAGNOSIS — I493 Ventricular premature depolarization: Secondary | ICD-10-CM | POA: Insufficient documentation

## 2022-12-01 DIAGNOSIS — I350 Nonrheumatic aortic (valve) stenosis: Secondary | ICD-10-CM | POA: Diagnosis not present

## 2022-12-01 DIAGNOSIS — I2510 Atherosclerotic heart disease of native coronary artery without angina pectoris: Secondary | ICD-10-CM | POA: Insufficient documentation

## 2022-12-01 DIAGNOSIS — R0609 Other forms of dyspnea: Secondary | ICD-10-CM | POA: Insufficient documentation

## 2022-12-01 DIAGNOSIS — G4733 Obstructive sleep apnea (adult) (pediatric): Secondary | ICD-10-CM | POA: Diagnosis not present

## 2022-12-01 LAB — BASIC METABOLIC PANEL
Anion gap: 5 (ref 5–15)
BUN: 16 mg/dL (ref 8–23)
CO2: 28 mmol/L (ref 22–32)
Calcium: 8.6 mg/dL — ABNORMAL LOW (ref 8.9–10.3)
Chloride: 98 mmol/L (ref 98–111)
Creatinine, Ser: 0.97 mg/dL (ref 0.61–1.24)
GFR, Estimated: >60 mL/min (ref >60)
Glucose, Bld: 123 mg/dL — ABNORMAL HIGH (ref 70–99)
Potassium: 4.1 mmol/L (ref 3.5–5.1)
Sodium: 131 mmol/L — ABNORMAL LOW (ref 135–145)

## 2022-12-01 LAB — MAGNESIUM: Magnesium: 2.1 mg/dL (ref 1.7–2.4)

## 2022-12-01 LAB — LIPID PANEL
Cholesterol: 152 mg/dL (ref 0–200)
HDL: 50 mg/dL (ref >40)
LDL Cholesterol: 68 mg/dL (ref 0–99)
Total CHOL/HDL Ratio: 3 RATIO
Triglycerides: 172 mg/dL — ABNORMAL HIGH (ref <150)
VLDL: 34 mg/dL (ref 0–40)

## 2022-12-01 LAB — TSH: TSH: 1.229 u[IU]/mL (ref 0.350–4.500)

## 2022-12-01 LAB — BRAIN NATRIURETIC PEPTIDE: B Natriuretic Peptide: 86 pg/mL (ref 0.0–100.0)

## 2022-12-01 NOTE — Patient Instructions (Signed)
There has been no changes to your medications.  Labs done today, your results will be available in MyChart, we will contact you for abnormal readings.   Your physician recommends that you schedule a follow-up appointment in: 6 months ( November ) ** please call the office in August to arrange your follow up appointment. **  If you have any questions or concerns before your next appointment please send us a message through mychart or call our office at [phone].    TO LEAVE A MESSAGE FOR THE NURSE SELECT OPTION 2, PLEASE LEAVE A MESSAGE INCLUDING: YOUR NAME DATE OF BIRTH CALL BACK NUMBER REASON FOR CALL**this is important as we prioritize the call backs  YOU WILL RECEIVE A CALL BACK THE SAME DAY AS LONG AS YOU CALL BEFORE 4:00 PM  At the [facility], you and your health needs are our priority. As part of our continuing mission to provide you with exceptional heart care, we have created designated Provider Care Teams. These Care Teams include your primary Cardiologist (physician) and Advanced Practice Providers (APPs- Physician Assistants and Nurse Practitioners) who all work together to provide you with the care you need, when you need it.   You may see any of the following providers on your designated Care Team at your next follow up: Dr Daniel Bensimhon Dr Dalton McLean Dr. Aditya Sabharwal Amy Clegg, NP Brittainy Simmons, PA Jessica Milford,NP Lindsay Finch, PA Alma Diaz, NP Lauren Kemp, PharmD   Please be sure to bring in all your medications bottles to every appointment.    Thank you for choosing [facility]    

## 2022-12-02 NOTE — Progress Notes (Signed)
PCP: Rodrigo Ran, MD Pulmonology: Dr. Marchelle Gearing Cardiology: Dr. Shirlee Latch  82 y.o. with history of pulmonary fibrosis, high grade B cell lymphoma, and prostate cancer was referred by Dr. Marchelle Gearing for evaluation of dyspnea and tachycardia.  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  He is on Esbriet.  He has not used home oxygen.  He has baseline exertional dyspnea.  In 5/22, he had XRT for prostate cancer.  In 9/22, he was diagnosed with high grade B cell lymphoma and finished his 6th cycle of mini-CHOP in 4/23.  Pre-op echo in 9/22 showed EF 65-70%, normal RV, unable to estimate PASP, mild AS mean gradient 12 mmHg.  Patient has been noted by CT to have coronary calcification.   After completing CHOP in 4/23, patient developed persistent sinus tachycardia.  He felt palpitations.  D dimer was normal.  No evidence for infection.  Echo in 4/23 showed EF 55-60%, normal RV, unable to assess PA pressure (no TR jet), and nodular calcification of the mitral valve.  Due to mitral valve abnormality, blood cultures were done which were negative.  Sinus tachycardia eventually subsided on its own.   There was talk over doing CTA to further investigate etiology of dyspnea. Felt arranging LHC at time of RHC would make most sense.   Underwent [facility] 10/23 showing mild LAD lesion 40% stenosed, normal filling pressures, normal PA pressure, normal CO.  S/p cervical spinal decompression & fusion 08/11/22. Seen in ED 08/23/22 with palpitations. 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 Zio monitor in 3/24 showed 16% PVCs.  Toprol XL increased to 25 mg bid.    Patient returns for followup of dyspnea/PVCs.  He seems to be having less PVCs with increased Toprol XL, none on ECG today or on my exam.  He has chronic fatigue. Stable dyspnea walking up inclines or stairs, does ok on flat ground.  Not on home oxygen.  No  palpitations.  No chest pain. No lightheadedness.   ECG (personally reviewed): NSR, no PVCs  Labs (4/23): D dimer negative, BNP 137, HS-TnI 13, K 4.1, creatinine 0.59, hgb 10.2, WBCs 12 Labs (5/23): K 4.4, creatinine 0.64, hgb 11.3 Labs (2/24): TSH and LFTs normal, K 4.0, creatinine 0.59, hgb 11.3, HS-Tnl 7>>7. Labs (4/24): K 3.9, creatinine 0.66  PMH: 1. IBS 2. 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  - CT chest in 9/22 with pulmonary fibrosis, enlarged PA, coronary artery calcium.  3. CAD: Coronary calcification on 9/22 CT chest.  - Cardiolite in 9/18 showed no ischemia or infarction.  - LHC (10/23): mild nonobstructive CAD with 40% mid LAD lesion 4. Hyperlipidemia 5. High grade B cell lymphoma: Diagnosed in 9/22.  He received 6 cycles of mini-CHOP.  - Echo (9/22): Prechemo, EF 65-70%, normal RV, unable to estimate PASP, mild AS mean gradient 12 mmHg.  - Echo (4/23): EF 55-60%, normal RV, unable to assess PA pressure (no TR jet), and nodular calcification of the mitral valve.  - RHC (10/23): RA 2, PA 24/5 (mean 14), PCWP 5, CO/CI 6.26/3.29 - Echo (3/24): EF 55-60%, normal RV, mild-moderate AS with mean gradient 11 and AVA 1.52 cm^2,  6. Aortic stenosis: Mild-moderate on 3/24 echo.  7. 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  8. H/o L-spine compression fracture.  9. Sleep study 5/24 with mild OSA.  10. PVCs: Zio monitor (3/24) with 16% PVCs  SH: Married, lives in [place], retired [employer], nonsmoker,  rare ETOH.   Family History  Problem Relation Age of Onset   Heart failure Father 72   Bladder Cancer Mother    Breast cancer Mother    Colon cancer Neg Hx    Pancreatic cancer Neg Hx    Rectal cancer Neg Hx    Stomach cancer Neg Hx    Prostate cancer Neg Hx    ROS: All systems reviewed and negative except as per HPI.   Current Outpatient Medications  Medication Sig Dispense Refill   acetaminophen (TYLENOL) 500 MG tablet Take 1,000 mg by mouth every 6 (six) hours as  needed for moderate pain.     bismuth subsalicylate (PEPTO BISMOL) 262 MG/15ML suspension Take 30 mLs by mouth every 6 (six) hours as needed for diarrhea or loose stools or indigestion.     carboxymethylcellulose (REFRESH TEARS) 0.5 % SOLN Place 1 drop into both eyes daily as needed (dry eyes).     Cholecalciferol (VITAMIN D-3) 1000 UNITS CAPS Take 2,000 Units by mouth every evening.     CO-ENZYME Q-10 PO Take 1 tablet by mouth every evening.      Cyanocobalamin (VITAMIN B12) 1000 MCG TBCR Take 1,000 mcg by mouth daily.     denosumab (PROLIA) 60 MG/ML SOSY injection Inject 60 mg into the skin every 6 (six) months. At [facility]     desvenlafaxine (PRISTIQ) 50 MG 24 hr tablet Take 50 mg by mouth daily.     diclofenac (VOLTAREN) 75 MG EC tablet Take 75 mg by mouth 2 (two) times daily as needed (pain.).  2   diclofenac sodium (VOLTAREN) 1 % GEL Apply 4 g topically 4 (four) times daily as needed (pain.).  12   diphenoxylate-atropine (LOMOTIL) 2.5-0.025 MG tablet Take 1 tablet by mouth 4 (four) times daily as needed for diarrhea or loose stools.     Emollient (AQUAPHOR ADV PROTECT HEALING EX) Apply 1 Application topically daily as needed (itching).     Evolocumab (REPATHA [place]) Inject 140 mg into the skin every 30 (thirty) days.     Fluticasone Propionate (XHANCE) 93 MCG/ACT EXHU Place 1 spray into both nostrils 2 (two) times daily as needed (sinus).     Glucosamine Sulfate 500 MG TABS Take 500 mg by mouth daily.     latanoprost (XALATAN) 0.005 % ophthalmic solution Place 1 drop into both eyes at bedtime.     loratadine (CLARITIN) 10 MG tablet Take 10 mg by mouth daily.     meclizine (ANTIVERT) 25 MG tablet Take 25 mg by mouth 3 (three) times daily as needed for dizziness.      methocarbamol (ROBAXIN) 500 MG tablet Take 500 mg by mouth every 8 (eight) hours as needed (severe muscle spasms).     metoprolol succinate (TOPROL XL) 25 MG 24 hr tablet Take 1 tablet (25 mg total) by mouth 2 (two)  times daily. 60 tablet 11   Multiple Vitamin (MULTIVITAMIN) tablet Take 1 tablet by mouth daily.     omeprazole (PRILOSEC) 40 MG capsule TAKE 1 CAPSULE (40 MG TOTAL) BY MOUTH DAILY. 90 capsule 0   ondansetron (ZOFRAN) 8 MG tablet Take 1 tablet (8 mg total) by mouth every 8 (eight) hours as needed for nausea or vomiting. 30 tablet 1   Pirfenidone (ESBRIET) 801 MG TABS Take 1 tablet by mouth 3 (three) times daily with meals. 270 tablet 1   Probiotic Product (ALIGN) 4 MG CAPS Take 4 mg by mouth daily.     dicyclomine (BENTYL) 10  MG capsule Take one by mouth every 6 hours as needed for abdominal cramping 30 capsule 1   HYDROcodone-acetaminophen (NORCO/VICODIN) 5-325 MG tablet Take 1 tablet by mouth every 4 (four) hours as needed for moderate pain ((score 4 to 6)). 30 tablet 0   No current facility-administered medications for this encounter.   Wt Readings from Last 3 Encounters:  12/01/22 76.3 kg (168 lb 3.2 oz)  10/13/22 74.6 kg (164 lb 6.4 oz)  10/06/22 73.8 kg (162 lb 12.8 oz)   BP 110/70   Pulse 79   Wt 76.3 kg (168 lb 3.2 oz)   SpO2 95%   BMI 24.84 kg/m  General: NAD Neck: No JVD, no thyromegaly or thyroid nodule.  Lungs: Dry crackles at bases.  CV: Nondisplaced PMI.  Heart regular S1/S2, no S3/S4, 2/6 SEM RUSB with clear S2.  No peripheral edema.  No carotid bruit.  Normal pedal pulses.  Abdomen: Soft, nontender, no hepatosplenomegaly, no distention.  Skin: Intact without lesions or rashes.  Neurologic: Alert and oriented x 3.  Psych: Normal affect. Extremities: No clubbing or cyanosis.  HEENT: Normal.   Assessment/Plan: 1. PVCs: Zio monitor in 3/24 with 16% PVCs.  Toprol XL was increased.  Today, no PVCs on ECG or exam.  Echo in 3/24 showed normal EF.  Cath in 10/23 showed nonobstructive CAD.  - Continue Toprol XL 25 mg bid.  - Check TSH, BMET, Mg - Mild OSA on sleep study, he does not want to use CPAP.  2. Exertional dyspnea: Suspect this is primarily due to IPF.  Echo in  3/24 showed EF 55-60%, normal RV, mild-moderate AS with mean gradient 11 and AVA 1.52 cm^2.  RHC (10/23) with normal filling pressures, normal PA pressure and normal CO. No indication for Tyvaso. He is not volume overloaded on exam.  - Suspect progressive IPF is the main contributor to dyspnea.  - Check BNP.  4. IPF: Long-standing, has been on Esbriet.  5. CAD: Coronary calcification noted. LHC (10/23) with mild, non-obstructive CAD; mild LAD 40% stenosed. No chest pain. - Continue ASA 81.  - Continue Repatha. Check lipids today.  6. Aortic stenosis: Mild to moderate on 3/24 echo.    Follow up 6 months.   Marca Ancona  12/02/2022

## 2022-12-06 ENCOUNTER — Other Ambulatory Visit: Payer: Self-pay | Admitting: Internal Medicine

## 2022-12-08 ENCOUNTER — Telehealth: Payer: Self-pay

## 2022-12-08 NOTE — Telephone Encounter (Signed)
Spoke with patient and notified of sleep study results and recommendations. Patient will call back to schedule visit for treatment options at a later date. All questions (if any) were answered. Patient verbalized understanding.

## 2022-12-08 NOTE — Telephone Encounter (Signed)
-----   Message from Quintella Reichert, MD sent at 11/08/2022 10:49 AM EDT ----- Patient has very mild OSA - set up OV to discuss treatment options.

## 2022-12-16 DIAGNOSIS — M5416 Radiculopathy, lumbar region: Secondary | ICD-10-CM | POA: Diagnosis not present

## 2022-12-31 DIAGNOSIS — M5416 Radiculopathy, lumbar region: Secondary | ICD-10-CM | POA: Diagnosis not present

## 2022-12-31 DIAGNOSIS — M5136 Other intervertebral disc degeneration, lumbar region: Secondary | ICD-10-CM | POA: Diagnosis not present

## 2022-12-31 DIAGNOSIS — M48061 Spinal stenosis, lumbar region without neurogenic claudication: Secondary | ICD-10-CM | POA: Diagnosis not present

## 2022-12-31 DIAGNOSIS — M47816 Spondylosis without myelopathy or radiculopathy, lumbar region: Secondary | ICD-10-CM | POA: Diagnosis not present

## 2023-01-04 ENCOUNTER — Telehealth: Payer: Self-pay | Admitting: Oncology

## 2023-01-04 ENCOUNTER — Ambulatory Visit: Payer: PPO | Admitting: Internal Medicine

## 2023-01-04 ENCOUNTER — Ambulatory Visit (INDEPENDENT_AMBULATORY_CARE_PROVIDER_SITE_OTHER): Payer: PPO | Admitting: Internal Medicine

## 2023-01-04 ENCOUNTER — Encounter: Payer: Self-pay | Admitting: Internal Medicine

## 2023-01-04 VITALS — BP 100/60 | HR 65 | Ht 69.0 in | Wt 164.6 lb

## 2023-01-04 DIAGNOSIS — Z7184 Encounter for health counseling related to travel: Secondary | ICD-10-CM | POA: Diagnosis not present

## 2023-01-04 DIAGNOSIS — J849 Interstitial pulmonary disease, unspecified: Secondary | ICD-10-CM | POA: Diagnosis not present

## 2023-01-04 DIAGNOSIS — R5382 Chronic fatigue, unspecified: Secondary | ICD-10-CM

## 2023-01-04 DIAGNOSIS — J84112 Idiopathic pulmonary fibrosis: Secondary | ICD-10-CM

## 2023-01-04 DIAGNOSIS — Z5181 Encounter for therapeutic drug level monitoring: Secondary | ICD-10-CM

## 2023-01-04 LAB — PULMONARY FUNCTION TEST
DL/VA % pred: 77 %
DL/VA: 3.04 ml/min/mmHg/L
DLCO cor % pred: 46 %
DLCO cor: 10.66 ml/min/mmHg
DLCO unc % pred: 46 %
DLCO unc: 10.66 ml/min/mmHg
FEF 25-75 Pre: 2.3 L/s
FEF2575-%Pred-Pre: 132 %
FEV1-%Pred-Pre: 74 %
FEV1-Pre: 1.92 L
FEV1FVC-%Pred-Pre: 118 %
FEV6-%Pred-Pre: 67 %
FEV6-Pre: 2.27 L
FEV6FVC-%Pred-Pre: 107 %
FVC-%Pred-Pre: 62 %
FVC-Pre: 2.27 L
Pre FEV1/FVC ratio: 84 %
Pre FEV6/FVC Ratio: 100 %

## 2023-01-04 NOTE — Progress Notes (Signed)
Spiro/DLCO performed today.  

## 2023-01-04 NOTE — Patient Instructions (Signed)
Spiro/DLCO performed today.  

## 2023-01-04 NOTE — Progress Notes (Signed)
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 He tells me that this summer 2018*noticing insidious onset of shortness of breath particularly with walking stairs in the house or [place]. This was not that in the previous years. The summer 2018 he was in [place] but did not notice much shortness of breath. Otherwise he is able to do his activities of daily living and played golf with a car. He is more bothered by his back pain and sciatica as a result of previous back surgery. Shortness of breath is extremely mild. He says he became more aware of it after the radiologic investigations documented below. He had a chest x-ray that suggested interstitial findings and therefore he underwent a high-resolution CT scan of the chest that is described below. I personally visualized this high resolution CT chest and to me it shows bilateral bibasal subpleural reticulation that also extends to the upper lobes without any zonal predominance. There is no obvious honeycombing but there is traction bronchiectasis. There no mediastinal adenopathy. Therefore he is been referred here. In terms of exposure history other than radiation exposure he has not been on any pulmonary toxic drugs or any mold exposure or asbestos exposure. Does have GERD and is on PPI and is well ocntrolled   Does have spring and perennial allergies - sneezes easy. Allergy test with [facility] negative some years ago  [employer] chest physicians interstitial lung disease questionnaire: He says that he does not cough. He is only trouble with dyspnea with strenuous exercise and it started 4 months ago. Past medical history significant for acid reflux. He has never smoked any tobacco or street drugs. Does not have any family history of lung disease. At  home he does not have any humidifier sound birds heart the water damage or mold. 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 He has standard radiation exposure while at work. The standard radiation for a radiologist. He does not take any pulmonary toxic drugs. House is 81 years old and he worries about mold. He sneezes if air is forced   Pulmonary function test shows isolated reduction in diffusion capacity to 62%. Walking desaturation test underneath her feet 3 laps on room air: Resting pulse ox 96%. Final pulse ox 95%. Heart rate resting was 92/m and went up to 109 minute.   Results for Kenneth, [name] (MRN [id number]) as of 03/23/2017 10:04  Ref. Range 03/04/2017 09:13  FVC-Pre Latest Units: L 3.59  FVC-%Pred-Pre Latest Units: % 86  Results for Kenneth, [name] (MRN [id number]) as of 03/23/2017 10:04  Ref. Range 03/04/2017 09:13  DLCO cor Latest Units: ml/min/mmHg 20.06  DLCO cor % pred Latest Units: % 63    Lungs/Pleura: There is subpleural reticulation with scattered ground-glass and traction bronchiolectasis, without a definite zonal predominance. No definitive honeycombing. Findings appear mildly progressive when radiographs dating back to 12/28/2008 are reviewed. Probable 5 mm subpleural lymph node along the right major fissure. No pleural fluid. Airway is unremarkable.      IMPRESSION: 1. Pulmonary parenchymal findings of interstitial lung disease which may be due to fibrotic nonspecific interstitial pneumonitis or, given slight progression over time, usual interstitial pneumonitis. 2. Aortic atherosclerosis (ICD10-170.0). Coronary artery calcification. 3. Aortic aneurysm NOS (ICD10-I71.9).  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 Recommend annual imaging followup by CTA or MRA. This recommendation follows 2010 ACCF/AHA/AATS/ACR/ASA/SCA/SCAI/SIR/STS/SVM Guidelines for the Diagnosis and Management of Patients with Thoracic  Aortic Disease. Circulation. 2010; 121: Z610-R604. 4. Prominence of the right and left main pulmonary arteries can be seen with pulmonary arterial hypertension.     Electronically Signed   By: Leanna Battles M.D.   On: 02/17/2017 13:41      OV 03/23/2017  Chief Complaint  Patient presents with   Follow-up    SOB on exertion. Other than that he has been doing about the same. Denies any cough or CP.   Here to discuss results. Wife [name] here. 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 In inteirm no new symptoms.  Autoimmune profile negative. They raise possibility of 2nd opinion.  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 Progression is based on CXR comparison since 2010; there is no CT and is only suspicion of progression OV 05/06/2017  Chief Complaint  Patient presents with   Follow-up    Follow up today after beginning Esbriet. Pt has no c/o SOB, cough, or CP. Has had some mild nausea from the Esbriet but other than that, he has been doing good on it.   ollow-up idiopathic pulmonary fibrosis mild severity  He is here to follow-up He is on the third week of his bed at 3 pills 3 times a day max dose. So far is tolerating it fine other than mild nausea. He does take after meals. He plans to meet with [facility] coordinator for medication support. He did visit [place] this past weekend and did have vomiting especially after consuming few etoh drinks at a [employer]. He does apply sunscreen [his daughter is a Armed forces operational officer in town]. He still complains about the fact that and is 81 year old home when the air conditioner comes on and they air blows out of the duct system he does sneeze 10 times and his feet does feel cold. She plans to put hepa filters that are high and and also get the duct cleaning. He says he did have allergy evaluation and found few years ago and it was negative. Recently he has  increased his rpinrole and alsogot an injection for his back and he does feel better   OV 06/22/2017  Chief Complaint  Patient presents with   Follow-up    Pt still taking Esbriet and has been doing good except has been having issues with nausea x3 days. PFT done today. Still becomes SOB with exertion. Denies any complaints of cough or CP.   IPF followup  Routine followup. Now on esbriet 3 pills tid since end oct 2018. Having new nausea - moderate, x 3 days. Might be chills. But no associated diarrhea or other side effects. Spacing esbriet 4h only. Also has complaitns about high co pay. Unable to find charity. Doing exrcise with trainer - feeels that is better and more vigorous than rehab. Gets tachy with eercise but says he does not desaturate. Had PFT - stable. Lot of questions about disease    Walking desaturation test on 06/22/2017 185 feet x 3 laps on ROOM AIR:  did NOT desaturate. Rest pulse ox was 100%, final pulse ox was 99%. HR response 84/min at rest to 97/min at peak exertion. Patient RALIK HERRIG  Did not Desaturate < 88% . Kenneth [name] did not  Desaturated </= 3% points. Ronaldo Miyamoto A Calandra yes did get tachyardic   OV 09/20/2017  Chief Complaint  Patient presents with   Follow-up    PFT done today.  Pt states he has been doing good. Pt still taking Esbriet and states he is doing good on it.   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  Collier Flowers for IPF follow-up.  Last visit December 2018.  Since then he has been compliant fully with his Pirfenidone (Esbriet).  His nausea resolved with Ginger intake.  However he is having 3-5 pound weight loss and also fatigue towards the end of the day and also low appetite.  He is exercising vigorously 5 times a week and he is wondering if the fatigue could be because of the heavy exercise.  He is not interested in lung transplantation.  He is open to research participation in interventional trial.  Currently he is on the IPF registry program.  Overall he is  well.  He did go to [place] to visit his son and he did not wear sunscreen and he did pick up a stage I skin burn with erythema that was more than usual.  But this resolved.  I did remind him about his obligation to wear sunscreen at all times with Pirfenidone (Esbriet).  He is interested in interventional research trials.  His pulmonary function test to me on an average is stable even though there is decline in FVC that seems to be an increase in DLCO so that is variability.  His walking desaturation test is around the same. He is interested in rolling over to the larger capsule of the Pirfenidone (Esbriet) so we will have to take it only 3 times a day. ESberit is cposting him $500/month or more due to high AGI and this is frustrating for him   Walking desaturation test on 09/20/2017 185 feet x 3 laps on ROOM AIR:  did walk briskly all 3 laps with only mild dyspnea desaturate. Rest pulse ox was 98%, final pulse ox was 98%. HR response 96/min at rest to 107/min at peak exertion. Patient KINGSTEN BAYANI  Did not Desaturate < 88% . Kenneth [name] did not  Desaturated </= 3% points. Kenneth Majestic Forbess yes did get tachyardic this appears similar to a few months ago   OV 12/07/2017  Chief Complaint  Patient presents with   Follow-up    Pt had pre Cleda Daub and DLCO prior to OV today. Pt has increase of SOB with exertion more with walking up the hill.   Dr. [name] presents for follow-up of his IPF to ILD clinic.  In terms of his IPF he feels stable.  In fact he tells me that if not for the incidental chest x-ray and a subsequent CT scan that picked up pulmonary fibrosis he even to this day will not know that he has IPF.  He only has a mild exertional dyspnea class I which she always believes in the past that it was because of aging.  At this point in time he is on Esbriet 1 big pill 3 times daily at full dose.  He is now applying sunscreen and has not had any further sunburns.  He is following an extremely intense exercise  fitness regimen to improve his cardiac conditioning.  He believes his exertional heart rate and resting heart rate are much improved than before.  He works with a Psychologist, educational at [employer].  His main issue is from Pirfenidone [employer]).  He had some mild nausea  that has now resolved with ginger capsules but he continues to have some amount of fatigue low appetite and some mild weight loss.  He says this is mild and all tolerable.  The fatigue happens after working a lot in the yard and he loves yard work.  He wants to know if he can continue to do yard work.  Recently he did have some sinus congestion and took some steroids and antibiotics for it and he feels better but he still has some sinus fullness.  He had spirometry and simple walking desaturation test and the profile is below and I believe these are stable.  In fact his exertional heart rate is improved compared to previous.  Today he also participated in the IPF registry program.  There is a noninterventional trial.  He is interested in future interventional trials.   OV 03/08/2018  Chief Complaint  Patient presents with   Follow-up    PFT performed today. Pt states he has been doing well except he has been having problems with nausea and dizziness, and loss of appetite.   Kenneth [name] - presents for follow-up. For his IPF. He is on esbruet now. He was tolerating his Pirfenidone (Esbriet) quite well other than minor side effects till last visit. However in thesummer of 2019e's had increased nauseaand also weight loss. He has lost 6 pounds since prior visit. Overall he is lost 11 pounds since September 2018. We started the medication esbeit  anti-fibrotic for him in October 2018. His pants are getting looser.he is also having significant fatigue. Nevertheless he does not want to switch to the of anti-fibrotic ofev because of prior history of irritable bowel syndrome. He says that his baseline irritable bowel syndrome is worse than any side effects  he is having with esbriet especially the fatigue.[name] is open to a transplant evaluation and consideration of research protocols. His spirometry shows stability with FVC but his DLCO might be declining.Marland Kitchen His simple walk test did not show anychange. He is not noticing any subjective changes in dyspnea on effort tolerance while working out although he does admit to increased dizziness particularly when playing golf. During this time recently has not monitored his pulse ox but in the past and never went below 92%.  Other issues: He had some myalgia despite change in his statin. He will address this with his primary care. 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 Primary care has ordered a follow-up CT scan.This can be combined with a high risk CT scan. I I have reached out to  the thoracic radiology     OV 05/10/2018  Subjective:  Patient ID: Kenneth [name], male , DOB: September 15, 1940 , age 55 y.o. , MRN: [id number] , ADDRESS: [address] [place] [address]   05/10/2018 -   Chief Complaint  Patient presents with   Follow-up    review spiro with dlco.  c/o stable doe.  On 801mg  Esbriet TID, notes occ dizziness, loss of appetite.      HPI CALVYN PORTERA 82 y.o. -returns-year follow-up.  Diagnosis made towards the end of 2018.  He has been on Esbriet since then.  His main issues have been weight loss.  He lost 10 pounds of weight at the time of last visit.  At the last visit he weighed 174 pounds in September 2019.  Back in August 2018 he weighed 186 pounds.  All this weight loss happen after he started Pirfenidone (Esbriet).  However he  is now stabilized and he continues to be 874 pounds.  He is able to do activities of daily living and exercise but when he does stairs he gets dyspneic.  Overall he feels stable in the last 1 year in terms of his effort tolerance.  His main thing is that he has no appetite.  He has been communicating with the Pirfenidone (Esbriet) supporting about how to  manage his low appetite and occasional nausea.  We discussed the options about taking brief holidays or reduction in dose.  He is willing to try this.  He does take occasional ginger.  Of note he had a transplant referral set up by myself or [facility] but as our injection letter because he was out of network.  He tells me that his insurance company only advised that he would have a higher co-pay so is a little bit puzzled by this.  Since then he said conversation with his primary care physician and he wants to see Dr. Hessie Dibble who is a son-in-law to his friend Dr. Marcy Panning retired Careers adviser.  I know Dr. Glynda Jaeger quite well from an IPF standpoint.  And therefore wholeheartedly supported the idea.  In terms of his lung function testing his FVC shows a decline although he is feeling the same.  On miniature walking test he seems a little more tachycardic but he says he feels the same.  He manages this heart rate with his exercise.  His DLCO with some variability appears stable.  He had a CT angiogram for thoracic aortic aneurysm in September 2019 and this when compared to one year earlier has been reported as stable but the different resolutions.  I offered to do a high-resolution CT chest here but he wants to hold off until he saw Dr. Glynda Jaeger.  OV 08/23/2018  Subjective:  Patient ID: Kenneth [name], male , DOB: Jan 12, 1941 , age 73 y.o. , MRN: [id number] , ADDRESS: [address] [place] [address]   08/23/2018 -   Chief Complaint  Patient presents with   Follow-up    PFT performed today. Pt states he has been doing well since last visit and denies any complaints.   IPF followup"   Diagnosis made towards the end of 2018.  He has been on Esbriet since then.   HPI Kenneth [name] 82 y.o. -Dr. Threasa Beards presents for follow-up of his IPF.  He continues to feel good.  His main issue is tolerating the Esbriet which gives some fatigue.  Sometimes he has to postpone it.  In terms of his dyspnea he  continues to exercise.  His dyspnea symptom score is really minimal.  His cough is nonexistent.  He was losing weight with Esbriet but now he is gained his weight back.  In the interim he did see Dr. Hessie Dibble June 23, 2018 at [facility].  He had a satisfactory visit.  He thought he was stable.  His next visit with Dr. Glynda Jaeger will be in June 2020.  Moving forward he wants to alternate every 6 months between myself and Dr. Glynda Jaeger at [facility] which will give him 3 months with 1 pulmonologist or the other.  He is beginning to get interested in research studies.  We did pulmonary function test on him today and is documented below.  The FVC appears a fluctuant but the DLCO definitely appears on a downward trend.  The new DLCO is on a global lung initiated [GL I] equation and therefore the percent predicted  is higher.  But the absolute value showed downward trend line.  We went over this.  He definitely does not feel this subjectively.  His last high-resolution CT scan of the chest was in August of 2018.  He is willing to have one at follow-up.  He wants to see me again in 6 months.  His simple walking desaturation test appears stable.  He is thinking of making a trip to [place] and wants to know if he should wear a mask.    OV 04/21/2019  Subjective:  Patient ID: Kenneth [name], male , DOB: 1941/02/21 , age 32 y.o. , MRN: [id number] , ADDRESS: [address] [place] [address]   04/21/2019 -   Chief Complaint  Patient presents with   Follow-up    Patient reports that his breathing is doing well at this time.    IPF followup. On esbriet  HPI Kenneth [name] 82 y.o. -presents for follow-up of his idiopathic pulmonary fibrosis.  Last visit was pre-pandemic in February 2018.  In the interim he did see Dr. Hessie Dibble at [facility] and deemed to be stable.  At this point in time he is reporting continued stability with the symptoms as seen by the score below.  However I did notice  that he is lost lot of weight.  His BMI with his clothes on is 25 with a weight of 170 pounds.  He says his dry weight is actually around 162 pounds.  He feels that this is the stable weight in the last 6 months.  He says he is not bothered by it.  He says this is because of low appetite because of the and pirfenidone.  He feels the pirfenidone is benefiting him and he continues to be is to be stable.  In fact his pulmonary function test shows " improvement".  He said that he felt the test today was somewhat variable and is effort based on the technician.  He had a recent high-resolution CT chest that shows continued stability.  I personally visualized this film.  At this point in time he is happy taking the pirfenidone.  But he did agree if there is further weight loss into the 150s pounds then we could reassess  Had a lot of discussion about COVID-19 and about appropriate risk reduction measures which he is continuing to do.  He did some read some literature about COVID-19 and had questions about it. He in Vit D supplementation and is agreeable to get his levels checked following literature about protective effect of Vit D  Other than that he reports 2 episodes of dizziness and in one of those he nearly passed out.  He was wondering if some of the symptoms were related to ropinirole and therefore he has stopped it.  I reviewed the literature with him and the significant amount of dizziness and hypotension.  And also GI symptoms which can probably accentuated with pirfenidone.   OV 10/16/2019  Subjective:  Patient ID: Kenneth [name], male , DOB: [birth year] , age 21 y.o. , MRN: [id number] , ADDRESS: [address] [place] [address]   10/16/2019 -   Chief Complaint  Patient presents with   Follow-up    PFT performed 3/31.  Pt states he is doing okay. States his breathing is about the same since last visit.     HPI Kenneth [name] 82 y.o.  -follow-up IPF on pirfenidone    -ROS   41-month  follow-up for  Dr. [name].  He continues on pirfenidone.  He is taking full dose.  In the interim he has had hernia surgery 6 weeks ago for right inguinal hernia.  This is gone well.  He is change his exercise from bicycle to walking because of the hernia.  His symptom score is the same.  In January 2021 he saw Dr. Glynda Jaeger at [facility] for IPF follow-up.  Deemed to be stable.  I reviewed that note.  After that he has had pulmonary function test with [place] couple weeks ago.  This shows a decline.  He says the quality of instructions was poor and therefore the decline is because of that.  At least that is his suspicion.  He feels well.  Walking desaturation test is stable.  He has gained some weight.  He continues to have some nausea and fatigue and diarrhea with the pirfenidone.  But this is stable and manageable.  He feels his irritable bowel syndrome is active because of the pirfenidone.  He plans to see Dr. Harlene Salts for that.  His last liver function test was with Dr. Glynda Jaeger in January 2021.  This was normal per the note.  His last CT scan was towards the end of 2020 and deemed to be stable.     OV 04/11/2020   Subjective:  Patient ID: Kenneth [name], male , DOB: 1940-09-14, age 62 y.o. years. , MRN: [id number],  ADDRESS: [address] [place] [address] PCP  Rodrigo Ran, MD Providers : Dr Delton See  Treatment Team:  Attending Provider: Kalman Shan, MD   Chief Complaint  Patient presents with   Follow-up    PFT performed today.  Pt states he has been doing well since last visit and denies any complaints.    Follow-up idiopathic pulmonary fibrosis.  Diagnosis towards the end of 2018.  On pirfenidone.  Last CT scan of the chest September 2020.   HPI Kenneth [name] 82 y.o. -presents for follow-up last seen in April 2021.  After that in July 2021 he visited with Dr. Hessie Dibble at [facility].  I reviewed the notes.  The feeling was that he was stable.  He is  only minimally symptomatic.  He had a 6-minute walk test and did really well without any significant desaturations.  He is now resorted to high intensity interval training based on advice from a physical therapist/gym instructor.  He feels like stronger and failure.  He also states that his appetite is better and is eating better.  He feels his weight is stable.  In terms of his IPF symptoms is minimal and documented below.  He just has mild irritable bowel syndrome with his Esbriet and baseline health.  His walking desaturation test today in the office is stable.  His pulmonary function test was reviewed.  Over the last 3 years it shows a slight steady decline.  This decline is both in FVC and DLCO.  He is averaging around 2.6% decline per year with FVC while 8% decline in the DLCO per year.  On an average with a 3% decline per year in the last 3 years.  We did discuss this.  We discussed #pulmonary fibrosis foundation support group -he will visit the website.  He gets newsletters from them.  At this point in time he is not interested in joining the support group but he did get the contact information for this and will reach out if he desires so.  #-We also discussed  research as a care option -we discussed trial sponsored by [place] the Child psychotherapist of pirfenidone.  The trial is called [employer].  The investigation medical product is called PRM-151 in the axilla macrophage pathway.  Promising phase 2 data.  Research team gave him the consent form under my delegation.  He understands research is voluntary and is to evaluate unproven therapies.  He understands that the primary intent is to contribute to her scientific development and therapeutic gain is secondary because of an approved product the risks benefit ratio is very different compared to blood products.  He is going to read the consent and think about potential participation.  I sent him via email medical journal articles about the investigational  product    He tells me that he feels his weight is stable.  His dry weight early in the morning is between 162-163 pounds.  I pointed out that his weight today is 168 pounds and is lower than before.  However he says that the difference rating 168-172 is related to clothing and his weight at home is stable.  He is not concerned about the slight weight loss.  Overall while he agrees he is lost weight since starting Esbriet he feels recently has been stable.  He wants to continue to monitor the situation  Irritable bowel: He said he will check with Dr. Marina Goodell about this  Other issues:  - He wants me to check his iron panel -He will do follow-up of his IPF-registry visit today  -for research-  -He has had his Covid booster    OV 10/22/2020  Subjective:  Patient ID: Kenneth [name], male , DOB: [birth year] , age 67 y.o. , MRN: [id number] , ADDRESS: [address] [place] [address] PCP Rodrigo Ran, MD Patient Care Team: Rodrigo Ran, MD as PCP - General (Internal Medicine) Felicita Gage, RN as Oncology Nurse Navigator  This Provider for this visit: Treatment Team:  Attending Provider: Kalman Shan, MD    10/22/2020 -   Chief Complaint  Patient presents with   Follow-up    Fatigue from radiation treatments, more winded and more GI upset from radiation treatments    Follow-up idiopathic pulmonary fibrosis.  Diagnosis towards the end of 2018.  On pirfenidone.  Last CT scan of the chest September 2020.    HPI Kenneth [name] 82 y.o. -returns for follow-up.  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  He is finished hormonal treatment and now is on radiation treatment.  Radiation treatment is aggravating his GI side effects from Pirfenidone (Esbriet) and from irritable bowel syndrome.  Is also causing fatigue.  He went August a masters in walk the tournament.  This made him more fatigued than what he experienced few years ago.  He said it took him a few days to recover.  He  used to have weight loss but this is now stabilized it is documented below.  His symptom severity score and his walking desaturation test are stable but his pulmonary function test continues to show decline.  His DLCO shows a relatively more decline but he said there were technique issues today.  He is got upcoming appointment with ILD clinic at [facility] in July.  At this point in time he is interested in phase 3 clinical trials.  We have quite a few to off as of summer 2022 but his prostate cancer could be an exclusion.  We will have the research team evaluate.  He is saddened by the  loss of his friend Dr. Samuella Cota.  He is evaluating whether he should join the pulmonary fibrosis foundation at the national level to be a board member.     OV 04/03/2021  Subjective:  Patient ID: Kenneth [name], male , DOB: 02/02/1941 , age 50 y.o. , MRN: [id number] , ADDRESS: [address] [place] [address] PCP Rodrigo Ran, MD Patient Care Team: Rodrigo Ran, MD as PCP - General (Internal Medicine) Felicita Gage, RN as Oncology Nurse Navigator  This Provider for this visit: Treatment Team:  Attending Provider: Kalman Shan, MD    04/03/2021 -   Chief Complaint  Patient presents with   Follow-up    Just performed a partial PFT, patient states increased SOB.    Follow-up idiopathic pulmonary fibrosis.  Diagnosis towards the end of 2018.  On pirfenidone.  Last CT scan of the chest September 2020. - > sept 2022  Underlying irritable bowel syndrome  Initial weight loss with pirfenidone and then stabilized  HPI Kenneth [name] 82 y.o. 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  Since I last saw him he has had several health issues.  He got diagnosed with prostate cancer and then had radiation to that.  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  He is going to have a colonoscopy.  He feels his irritable bowel syndrome pirfenidone and ultimately  the radiation made his GI symptoms significantly worse.  In order to combat the fatigue he has been undergoing physical therapy.  He also try to get back to golf and took a golf swing for practice and then states that he had a compression fracture in the spine.  This is probably also because of years of steroid injections in his back.  In addition when he had a high-resolution CT chest in September 2022 this month there was some neck nodes and he has had a neck CT scan followed by ENT visit to Dr. Jenne Pane.  Apparently fine-needle aspiration was nondiagnostic.  He is scheduled to undergo excision biopsy.  The combination of all this is gotten a little bit more anxious and depressed.  As you can see his symptom scores for depression and anxiety are up.  He is also feeling a little more short of breath than usual.  He says his pulse ox is in the low 90s when he exerts himself but it still adequate.  Review of his symptoms and objective basis shows his symptom score to be the same.  His walking desaturation test is also the same.  His weight is stable.  His pulmonary function test shows stability in diffusion but a decline in FVC compared to earlier this year.  Had a high-resolution CT chest that only shows minimal progression fibrosis over 2 years.  Echocardiogram September 2022 did not show any evidence of pulmonary hypertension     CLINICAL DATA:  Pulmonary fibrosis.   EXAM: CT CHEST WITHOUT CONTRAST   TECHNIQUE: Multidetector CT imaging of the chest was performed following the standard protocol without intravenous contrast. High resolution imaging of the lungs, as well as inspiratory and expiratory imaging, was performed.   COMPARISON:  03/28/2019, 03/30/2018 and 02/27/2017. Bone scan 05/21/2020.   FINDINGS: Cardiovascular: Atherosclerotic calcification of the aorta, aortic valve and coronary arteries. Enlarged pulmonic trunk and right and left main pulmonary arteries. Heart is at the upper  limits of normal in size. No pericardial effusion.   Mediastinum/Nodes: Low left internal jugular lymph  nodes measure up to 8 mm (2/8), previously 3 mm on 03/28/2019. Mediastinal lymph nodes are subcentimeter in short axis size, as before. Hilar regions are difficult to definitively evaluate without IV contrast. Left axillary lymph nodes measure up to 9 mm, are unchanged and not considered enlarged by CT size criteria. High left subpectoral lymph node measures 7 mm (2/11), increased from 4 mm on 03/28/2019. No right axillary adenopathy. Esophagus is grossly unremarkable. Periesophageal lymph nodes are subcentimeter in short axis size and appears similar.   Lungs/Pleura: Peripheral and basilar predominant subpleural reticulation, ground-glass and traction bronchiectasis/bronchiolectasis, minimally progressive from 03/28/2019. Evidence of progression is best seen in the lower lung zones. No pleural fluid. Airway is unremarkable. No air trapping.   Upper Abdomen: Visualized portions of the liver, gallbladder, adrenal glands, kidneys, spleen, pancreas, stomach and bowel are unremarkable.   Musculoskeletal: Degenerative changes in the spine. There is a new small rounded sclerotic lesion in the lateral aspect of the right third rib (sagittal image 32). New lucent lesion in the left posterolateral aspect of the T8 vertebral body measuring approximately 1.3 x 1.7 x 2.1 cm (2/84 and sagittal image 96). This likely corresponds to abnormal uptake seen on bone scan 05/21/2020. Finding is new from 03/28/2019. Slight compression of the T12 superior endplate is new.   IMPRESSION: 1. Slight interval progression of pulmonary fibrosis. Findings are consistent with UIP per consensus guidelines: Diagnosis of Idiopathic Pulmonary Fibrosis: An Official ATS/ERS/JRS/ALAT Clinical Practice Guideline. Am Rosezetta Schlatter Crit Care Med Vol 198, Iss 5, [phone], Mar 06 2017. 2. New small sclerotic lesion in the  lateral aspect of the right third rib, worrisome for metastatic prostate cancer. 3. Lucent lesion in the left posterolateral aspect of the T8 vertebral body corresponds to abnormal uptake on bone scan 05/21/2020, and is new from CT chest 03/28/2019, raising suspicion for malignancy. 4. Left internal and high left subpectoral lymph nodes are subcentimeter in size but appear slightly larger than on 03/28/2019. Difficult to exclude a lymphoproliferative disorder. Please refer to CT neck with contrast done the same day in further evaluation. 5. Slight compression of the T12 superior endplate is new but age indeterminate. 6. Aortic atherosclerosis (ICD10-I70.0). Coronary artery calcification. 7. Enlarged pulmonary arteries, indicative of pulmonary arterial hypertension.     Electronically Signed   By: Leanna Battles M.D.   On: 03/19/2021 15:12   Xxxxx  IMPRESSION: Neck CT September 2022 Mildly prominent lymph nodes in the neck bilaterally left greater than right. Cluster of left supraclavicular and left posterior lymph nodes all under 1 cm.   Largest lymph node in the neck is a right level 3 lymph node measuring 12 mm on axial image 75.     Electronically Signed   By: Marlan Palau M.D.   On: 03/19/2021 15:24   Xxxx IMPRESSIONS echocardiogram September 2022    1. Left ventricular ejection fraction, by estimation, is 65 to 70%. The  left ventricle has normal function. The left ventricle has no regional  wall motion abnormalities. Left ventricular diastolic parameters are  consistent with Grade I diastolic  dysfunction (impaired relaxation).   2. Right ventricular systolic function is normal. The right ventricular  size is normal. Tricuspid regurgitation signal is inadequate for assessing  PA pressure.   3. Left atrial size was mildly dilated.   4. The mitral valve is abnormal. Trivial mitral valve regurgitation. No  evidence of mitral stenosis.   5. The aortic valve is  tricuspid. There is mild calcification of the  aortic valve. There is moderate thickening of the aortic valve. Aortic  valve regurgitation is not visualized. Mild aortic valve stenosis. Aortic  valve area, by VTI measures 1.62 cm.  Aortic valve mean gradient measures 12.0 mmHg. Aortic valve Vmax measures  2.31 m/s. DI is 0.43.   6. Aortic dilatation noted. There is borderline dilatation of the aortic  root, measuring 39 mm. There is borderline dilatation of the ascending  aorta, measuring 39 mm.   7. The inferior vena cava is normal in size with <50% respiratory  variability, suggesting right atrial pressure of 8 mmHg.   Comparison(s): Changes from prior study are noted. 03/02/2017: LVEF 60-65%,  mild LVH, grade 1 DD, aortic root 39 mm, no aortic stenosis.   OV 06/17/2021  Subjective:  Patient ID: Kenneth [name], male , DOB: 1940/09/20 , age 82 y.o. , MRN: [id number] , ADDRESS: 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     HPI Kenneth [name] [name] 82 y.o. 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  Since his last visit from ILD standpoint he continues to be stable.  His walking desaturation test is stable.  However he is reporting more shortness of breath but this is directly related to more fatigue.  Review of the labs indicate in October 2022 he had mild anemia and also mild hyponatremia.  In the past radiation therapy for prostate made him extremely tired.  He is also on pirfenidone that can cause tiredness.  He now has lymphoma also that can cause tiredness.  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  He seen Dr. Rolm Baptise.  They are planning to start R-CHOP treatment after Christmas.  He is frustrated by the recurrence of various medical illnesses.  The second cancer since his IPF diagnosis.  In terms of his IPF: He is now on generic pirfenidone.  He feels the nausea is worse.  He wants to switch to branded Esbriet.  Gave him donor sample of Esbriet by [employer].  Lot number [id number] with expiration June 2025 [unopened bottle], lot number [id number] with expiration 05/2023 [unopened bottle and also lot number [id number] with expiration 12/2023 [partial bottle].  He is asked me to send a message to the pharmacist to make the switch.  He says even if it is more expensive he will play for the branded Esbriet because it is better quality of life.  He is worried that nausea and other side effects will get worse particularly once he starts chemotherapy.   No results found.   OV 09/18/2021  Subjective:  Patient ID: Kenneth [name], male , DOB: Jan 28, 1941 , age 28 y.o. , MRN: [id number] , ADDRESS: [address] [place] [address] PCP Rodrigo Ran, MD Patient Care Team: Rodrigo Ran, MD as PCP - General (Internal Medicine) Felicita Gage, RN as Oncology Nurse Navigator [place], Jacquelyne Balint, RN as Oncology Nurse Navigator  This Provider for this visit: Treatment Team:  Attending Provider:  Kalman Shan, MD  Follow-up idiopathic pulmonary fibrosis.  Diagnosis towards the end of 2018.  On pirfenidone.  Last CT scan of the chest September 2020. - > sept 2022  Underlying irritable bowel syndrome  Initial weight loss with pirfenidone and then stabilized  History of prostate cancer  New diagnosis of B-cell lymphoma cervical lymphadenopathy -late 2022  Multifactorial fatigue  - mild anemia OCt 2022  - mild hypontermia OCt 2022  09/18/2021 -    Chief Complaint  Patient presents with   Follow-up    PFT performed today.  Pt states he is about the same since last visit. States that he is currently undergoing chemotherapy due to diagnosis of lymphoma.     HPI Kenneth TRETTIN 82 y.o. -returns for follow-up.  He is taking Esbriet instead of generic pirfenidone.  He is also taking 3 little pills 3 times daily.  He finds that Esbriet to be better than generic pirfenidone.  He finds 3 little pills better than 1 whole pill in terms of nausea.  Still is dealing with a lot of nausea both from the chemo and Esbriet.  He says yesterday was really bad day with nausea.  He does take Zofran but we discussed and said he will try taking the Zofran 30 to 60 minutes before the Esbriet.  Nevertheless he has been able to gain some weight.  He is actually feeling slightly less short of breath.  He is able to do more particularly after his back pain has resolved.  Nevertheless he is on antiprostate cancer therapy.  He is also undergoing chemotherapy from his B-cell lymphoma.  His mood is better.  He says he is socially isolating significantly because of his pulm fibrosis and cancer history.  He wanted to know if he could go out.  He did go to [employer] for show but wore a mask.  I did indicate to him that he could follow the CDC map to see prevalence of respiratory viruses and when the problem is overall low he could take more risk.  Also like being outdoors is less risky.  If he is endorsing particularly a cluster putting a mask on reduces his risk.  But I did say that he could use these guardrails and be more active so he can get some socialization and help his mood.  Can also give him purpose.  He is now sold his house and is planning to move into [facility].  In terms of his walking desaturation test and symptoms he stable.  In terms of his pulmonary function test his DLCO is stable but his FVC is down.  He is feeling similar.  Most of the decline  seems to be from several years ago but most recently appears to be stable overall. We did discuss clinical trials but cancer generally is an exclusion but otherwise he would be interested.  We did discuss potential for doing inhaled treprostinil trial.  We did discuss the fact the neck step and standard of care would be oxygen if he were to decline but so far he has held good.  He recently lost his friend [facility] who also had pulmonary fibrosis.     He has upcoming [employer] appointment but he missed the 1 earlier in the year because of all ischemia.  He is thinking about holding off on going to [facility] and just following here.  I was fine with this.   OV 10/28/2021  Subjective:  Patient ID: Kenneth Majestic  [name], male , DOB: [birth year] , age 17 y.o. , MRN: [id number] , ADDRESS: [address] [place] [address] PCP Rodrigo Ran, MD Patient Care Team: Rodrigo Ran, MD as PCP - General (Internal Medicine) Felicita Gage, RN as Oncology Nurse Navigator O'[place], Jacquelyne Balint, RN as Oncology Nurse Navigator  This Provider for this visit: Treatment Team:  Attending Provider: Kalman Shan, MD    10/28/2021 -   Chief Complaint  Patient presents with   Acute Visit    Pt states since his last chemo treatment, he has been having problems with his breathing and also states that his pulse has been elevated.     Kenneth [name] 82 y.o. -presents with his wife.  Status meeting his wife for the first time but she reminded me that she had made him early on in the course of his illness.  He is finished cycles of R-CHOP treatment for his lymphoma.  On 10/23/2021 Thursday he saw a nurse practitioner in oncology.  At that point it was noticed that he had finished mini CHOP treatment 5 rounds 10/02/2021.  He did complete his 6 cycle.  He was feeling well.  His resting heart rate was 82.  Pulse ox at rest was 100%.  His white count was observed to be slightly low.  They discussed several options and decided to  proceed with chemotherapy followed by [facility] single dose on 10/24/2021 Friday.  He was doing fairly okay then on Sunday, 10/26/2021 he noticed resting tachycardia heart rate 125 but no fever.  At no point any fever.  No cough no chills no nausea no vomiting no diarrhea.  The same thing persisted even on Monday, 10/27/2021.  He noticed that if he walked his heart rate was up to 140 he became short of breath and stop.  His pulse ox would drop to 90% but no abnormal desaturations.  He started getting worried.  He went to [employer] yesterday 10/27/2021.  EKG reviewed sinus tachycardia with some ectopic atrial beats.  Nothing acute.    Labs: Found to have mild hyponatremia.  Also found to have mild anemia white count in response to Udenyca went from low to  high.No D-dimer.  No troponin no BNP checked.  Last echo and CT scan of the chest in September 2022.  Walking desaturation test is definitely more tachycardic than baseline.  Associated with this he is also got exaggerated pulse ox drop although still adequate for simple walking test.  His pace is slower.   We repeated an EKG here: Personally visualized.  Sinus tachycardia heart rate 120 approximately.  Nothing acute.      OV 01/09/2022  Subjective:  Patient ID: Kenneth [name], male , DOB: 10/19/1940 , age 52 y.o. , MRN: [id number] , ADDRESS: [address] [place] [facility] [address] PCP Rodrigo Ran, MD Patient Care Team: Rodrigo Ran, MD as PCP - General (Internal Medicine) Felicita Gage, RN as Oncology Nurse Navigator O'[place], Jacquelyne Balint, RN as Oncology Nurse Navigator  This Provider for this visit: Treatment Team:  Attending Provider: Kalman Shan, MD    01/09/2022 -   Chief Complaint  Patient presents with   Follow-up    PFT performed today. Pt states that he feels like he may be a little worse since last visit. States that he has had some problems with congestion which he wonders could be due to the weather.   HPI ZACARY DELOE 82 y.o. 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  At  this point in time he is finished his chemotherapy.  In April 2023 he did have some tachycardic response following the last chemo.  Cardiac work-up ended up being fine.  He says overall he is been feeling fine.  He is now relocated to a retirement home in [place].  [place]  During this time his pulse ox immediately after exertion was always in the 90s.  He did not feel that his IPF was getting worse when he was there.  He just came back yesterday.  However for the last few days he is having increased fatigue nasal congestion.  He also feels his left Kenneth is blocked.  He tried to do pulmonary function test today.  He felt he did not give a good effort particularly the DLCO.  Consistent with this his numbers are much worse.  His FVC shows a 7% decline compared to March 2023 and DLCO 14% decline compared to March 2023.  His symptom score seems similar to September 2022.  His walking desaturation test is also stable on this level ground.  His weight loss is stabilized since he is at 168 pounds.  He is frustrated by this.  Is also frustrated by his multiple health issues.  The pirfenidone he is taking the generic version.  He is tolerating it but is requiring Zofran which then causes constipation.  Emotionally he is looking for a break from all his health issues  He plans [place] the [place] today.     01/26/2022: Today - follow up Patient presents today for follow up after being treated for sinusitis. He reports feeling better but still has issues with nasal congestion and pressure in his left Kenneth. They did improve with prednisone taper. These are problems he has been dealing with for a while now. He has tried multiple different nasal sprays with some relief. 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 He is sure this was the result of his worsening pulmonary function testing. He is  working on getting in to see Dr. Jenne Pane with ENT. Otherwise, no concerns today. Breathing is overall stable. He gets winded with stair climbing but otherwise, no issues. Doesn't feel like his activity is limited by his breathing. He's planning to go back [place] the mountains in the next few weeks.     OV 04/07/2022  Subjective:  Patient ID: Kenneth [name], male , DOB: August 09, 1940 , age 6 y.o. , MRN: [id number] , ADDRESS: [address] [place] [facility] [address] PCP Rodrigo Ran, MD Patient Care Team: Rodrigo Ran, MD as PCP - General (Internal Medicine) Felicita Gage, RN as Oncology Nurse Navigator [place], Jacquelyne Balint, RN as Oncology Nurse Navigator  This Provider for this visit: Treatment Team:  Attending Provider: Kalman Shan, MD   04/07/2022 -   Chief Complaint  Patient presents with   Follow-up    Spiro/DLCO done today. Breathing has been gradually worse since the last visit. He has had some muscle fatigue with exertion. He has some occ cough- non prod.      HPI Kenneth [name] 82 y.o. -returns for follow-up.  He feels he is slowly progressing in terms of his IPF.  In fact his shortness of breath scores are slightly worse.  Last time his pulmonary function test showed a decline he thought was from sinusitis.  Sinusitis is cleared up but he says today when he did his  PFTs he felt that he was declining.  He did have some cough he is having some new cough for the last few weeks.  He is worried this might be from worsening IPF but he typically gets cough this time of the year from fall allergy season.  Pulmonary function test shows an improvement compared to the most recent one but overall compared to earlier this year there is a decline.  I shared these results with him.  He is more worried about his fatigue.  However he did play some golf.  He says that he is significantly fatigued.  He has been going on for 6 to 12 months initially thought was related to cancer and chemotherapy but he says it  is the all the time.  The fatigue persist despite anemia correction despite his chemotherapy completing is still got fatigue.  He feels it happens episodically but then it is there all over the body and it exhausting.  He believes his primary care is checked his vitamin D levels because he is on replacement.  He believes his TSH and hemoglobin A1c to be normal.  Apparently primary care physician has asked him to consider getting a CT coronaries.  He has seen Dr. Marca Ancona in the past.  At the time it was from acute chemotherapy consideration.  Dr. Shirlee Latch at the time got an echocardiogram.  He does have enlarged pulmonary artery on the CT scan and he does have coronary artery calcification.  Back then in the spring during chemo side effects patient had deferred right heart catheterization.  This time of reach out to Dr. Shirlee Latch   He is due for an IPF-Pro registry visit today    OV 06/23/2022  Subjective:  Patient ID: Kenneth [name], male , DOB: 09-Jan-1941 , age 50 y.o. , MRN: [id number] , ADDRESS: [address] [facility] [id number] PCP Rodrigo Ran, MD Patient Care Team: Rodrigo Ran, MD as PCP - General (Internal Medicine) Felicita Gage, RN as Oncology Nurse Navigator O'Kelley, Jacquelyne Balint, RN as Oncology Nurse Navigator  This Provider for this visit: Treatment Team:  Attending Provider: Kalman Shan, MD    06/23/2022 -   Chief Complaint  Patient presents with   Follow-up    Pt states he has been doing okay since last visit.     HPI PHOENYX GALLARDO 82 y.o. 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  Overall he is in a better state of health.  His fatigue is improved.  His shortness of breath is also improved somewhat.  He continues pirfenidone at full dose.  His last chemotherapy was in J May 2023.  His last androgen deprivation treatment was in July 2023.  He believes the chemo and the androgen deprivation was causing his fatigue.  With the passage of time he is better.  He is now living  in [facility].  He is doing daily exercise.  He says that he can go well on a flat ground but when he does uphill sometimes he has to stop but most of the time he is doing well.  He does workout with exercise bike.  He states his tachycardia has improved with more more physical conditioning.  However he does get fatigued and short of breath when he tries to play golf.  His GI issues in combination of irritable bowel syndrome and pirfenidone continue but it is mild level.  He says 2023 has been a rough year and has been emotional.  Therefore primary care physician started  him on Pristiq on the advice of his daughter.  He says since then he somewhat better.  He did have sinusitis a few weeks ago and now he is better.  Take prednisone and cephalosporin.  Social note: We talked about masking particularly with the surgery for respiratory viruses.  On July 25, 2022 he is going to get the Carren Rang award for coverage at [facility].  He is going to talk about pulmonary fibrosis.  I encouraged him to be an advocate for pulmonary fibrosis.  He said in the past is The diagnosis private.  I did tell him that given the serious nature of the disease and rare disease I would encourage him to be as open as he wants to so there is more awareness and advocacy for the disease.  I also advised him to mask for this gathering to the extent possible as a risk reduction against respiratory viruses.    OV 10/13/2022  Subjective:  Patient ID: Kenneth [name], male , DOB: [birth year] , age 26 y.o. , MRN: [id number] , ADDRESS: [address] [place] [address] [facility] [address] PCP Rodrigo Ran, MD Patient Care Team: Rodrigo Ran, MD as PCP - General (Internal Medicine) Felicita Gage, RN as Oncology Nurse Navigator O'Kelley, Jacquelyne Balint, RN as Oncology Nurse Navigator  This Provider for this visit: Treatment Team:  Attending Provider: Kalman Shan, MD  10/13/2022 -   Chief Complaint  Patient presents with   Follow-up     F/up     HPI Kenneth [name] 82 y.o. -returns for follow-up.  On 08/11/2022 he underwent elective neck surgery by Dr. Dutch Quint because of cervical spondylosis and radiculopathy.  Since then the pain is resolved.  After this on 08/23/2022 he ended up with tachycardia in the ED.  Pulmonary embolism ruled out.  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  He is on beta-blocker.  He says he is a little more short of breath doing things around the house.  He is also having significant PVCs.  He is on Toprol.  His chronic anemia continues.  The PVC issue is new he is frustrated by this.  He had pulmonary function test today and it shows a definite decline compared to a year ago.  He is also lost weight with full dose pirfenidone.  He continues have significant fatigue.  Is also reporting some new nausea.  I did indicate to him that pirfenidone is the most likely culprit for the trial of weight loss fatigue and nausea.  We discussed taking a holiday with the pirfenidone but we took a shared decision making to reduce the dose and monitor.  In addition for his PVCs need to see if his ILD has gotten worse.  Which it has on the pulmonary function test.  He has agreed to get a overnight pulse oximetry study done.  He is somewhat despondent because of all his medical issues.       OV 01/04/2023  Subjective:  Patient ID: Kenneth [name], male , DOB: [birth year] , age 64 y.o. , MRN: [id number] , ADDRESS: [address] [address] [facility] [id number] PCP Rodrigo Ran, MD Patient Care Team: Rodrigo Ran, MD as PCP - General (Internal Medicine) Felicita Gage, RN as Oncology Nurse Navigator O'Kelley, Jacquelyne Balint, RN as Oncology Nurse Navigator  This Provider for this visit: Treatment Team:  Attending Provider: Kalman Shan, MD    Follow-up idiopathic pulmonary fibrosis.  Diagnosis towards the end of 2018.  On pirfenidone.  Last CT scan of the chest September 2020. 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  Depression and started on Pristiq by primary care physician in 2023    01/04/2023 -   Chief Complaint  Patient presents with   Follow-up    F/up on PFT     HPI Tameka A Albany 82 y.o. -returns for follow-up.  In this visit he reports that after moving to [facility] and the food quality is good he has gained weight.  His appetite is good.  His weight is stable.  However he does feel that he is declining.  He says symptoms are worse.  In fact the dyspnea symptom score shows that he is slowly declining with more dyspnea.  He is back on full dose pirfenidone.  The lower dose did not improve his fatigue.  We discussed and we agreed that his fatigue is likely multifactorial from the disease other medical issues and medications including Lopressor for PVC.  Today's exercise hypoxemia test was normal.  He is somewhat frustrated but also sanguine about the fact that he is declining.  His pulmonary function test does show decline.  I personally reviewed it.  Last set of labs was in May 2024 with a creatinine of 0.97 mg percent.  Last liver function test was in April 2024 normal.   Encounter therapeutic monitoring with high risk prescription pirfenidone  -  Esbriet/Pirfenidone requires intensive drug monitoring due to high concerns for Adverse effects of , including  Drug Induced Liver Injury, significant GI side effects that include but not limited to Diarrhea, Nausea, Vomiting,  and other system side effects that include Fatigue, headaches, weight loss and other side effects such as skin rash. These will be monitored with  blood work such as LFT initially once a month for 6 months and then quarterly  -He will need liver function test today.   Other issues - Did see Dr. Truett Perna 10/06/2022.  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  -We discussed the progression.  We felt the next best option is clinical trials but a lot of the trials excluded patients who have cancer.  Did express that currently 2 trials have high potential for future approval.  This including inhaled treprostinil versus placebo.  The other is Bexotegrast versus placebo.  Did indicate to him we will prescreen him and then decide which 1 he would be suitable for.  He is open to the idea of clinical trials.  Did communicate with the research coordinator Neita Garnet     SYMPTOM SCALE - ILD 08/23/2018  04/21/2019  10/16/2019  04/11/2020 168# 10/22/2020 170# - esbriet and XRT  for prostate 04/03/2021 168# 06/17/2021 168# lymphoma 09/18/2021 172# 01/09/2022 168# 04/07/2022 173# 06/23/2022 171#  10/13/2022 164# esbiret 01/04/2023 164# Esbret full dose  O2 use ra [name] ra   ra ra ra ra ra ra ra ra  Shortness of Breath 0 -> 5 scale with 5 being worst (score 6 If unable to do)              At rest 0 0 0 0 0 0 0 0 0 0  0 0 0  Simple tasks - showers, clothes change, eating, shaving *0** 0 0 0 0 0 0 0 0 1 0  1 1  Household (dishes, doing bed, laundry) 0 0 0 0 0 1 1 0 1 1  1 2 3   Shopping 0 0 0 0 0 0 1 0 1 1  1 2 3   Walking level at own pace 0 0 0 0 0 0 1 0 1 1  1 2 1   Walking up Stairs 2 2 2 1 2 2 3 2 3 4 3 4 4   Total (40 - 48) Dyspnea Score 2 2 2 1 2 3 6 2 6 8 6 11 12   How bad is  your cough? 0 0 0 0 0 0 1 2 0 1  0 0 0  How bad is your fatigue 1 due to esbriet 0 2 0 2 1 3.5 2 3 3 2  2.5 3  nausea   1 0 1 1 3 0 1 1 1 2  3  vomit   0 0 0 0 0 0 0 0 0 0  0  diarrhea   1 1 3 1 2  0 1 1 1 2 1   anxiety   0 0 0 1 1 1 2 0 0 1  2  depresso0   0 0 0 1 1 1 2 0 1 1  2        Simple office walk 185 feet x  3 laps goal with forehead probe 12/07/2017 Weight 180# 03/08/2018 Weight 174.9 05/10/2018 Weight 174# 08/23/2018 Weight 178# 04/21/2019 Weight 170# 10/16/2019 173# 04/11/2020 168# 10/22/2020 170# 04/03/2021 168# 06/17/2021 168# 09/18/2021  10/28/2021  01/09/2022 168# 06/23/2022 171# 01/04/2023   O2 used Room air Room air Room air Room air Room air Room air   ra ra ra  ra ra ra  Number laps completed 3 3 3 3 3 3   3  3     Sit stand x 15  Comments about pace Moderate pace Mod pace Mod fast pace fast  fast   fast   Avg pace - slower than before     Resting Pulse Ox/HR 100% and 83/min 100% ad 85/min 100% and 91/min 100% and 82/min  100% asnd 91 100% and 83/min 98% and 94/min 100% and 82 100% and 93 100% and 78 99% and H 126 99% and HR 88 100% ahd HR 87 97% andHR 70  Final Pulse Ox/HR 99% and 98/min 98$ and 104/min 98% and 108/min 98% and 100/min  96% and 106/mom 98% and 96/mn 95% and 110.min 97% and 105 97% ad 109 97% and 94 92% and HR 143 97% and HR 111 96% and HR 110 96% and HR 96  Desaturated </= 88% no no no no  no           Desaturated <= 3% points no no ni no  yesm 4 points  Yes, 3 points Yes, 3 poitn Yes 3 pomnts Yes 3 Yes, 7 points  Yes 4 ponts   Got Tachycardic >/=  90/min yes yes yes yes  yes  yes yes yes yes yes     Symptoms at end of test No complaints No complaints  No complaints  Mild dyspnea  no Mild dyspnea Mild dyspnea Mild dysnea Modeate dyspnea  No complaints Little dyspnea  Miscellaneous comments x ? sligh increase in tachy/stable ? incraeased tachty       Stable since April 2021         PFT     Latest Ref Rng & Units 01/04/2023    8:51 AM 10/09/2022    4:21 PM 04/07/2022     1:46 PM 01/09/2022    9:17 AM 09/18/2021    8:52 AM 04/03/2021    9:21 AM 10/21/2020    8:56 AM  PFT Results  FVC-Pre L 2.27  P 2.33  2.53  2.49  2.68  2.96  3.20   FVC-Predicted Pre % 62  P 63  68  64  68  75  81   Pre FEV1/FVC % % 84  P 86  86  87  84  81  81   FEV1-Pre L 1.92  P 2.01  2.18  2.16  2.25  2.38  2.59   FEV1-Predicted Pre % 74  P 77  83  78  80  85  91   DLCO uncorrected ml/min/mmHg 10.66  P 11.48  12.36  12.55  13.07  16.15  13.64   DLCO UNC% % 46  P 50  53  53  54  67  56   DLCO corrected ml/min/mmHg 10.66  P 12.81  13.57  12.55  14.64  16.15  13.64   DLCO COR %Predicted % 46  P 55  59  53  61  67  56   DLVA Predicted % 77  P 94  89  91  105  91  80     P Preliminary result       has a past medical history of Arthritis, Depression, Diffuse large B cell lymphoma (HCC) (2023), Diverticulosis of colon (without mention of hemorrhage), GERD (gastroesophageal reflux disease), Hiatal hernia, HLD (hyperlipidemia), Internal hemorrhoids without mention of complication, Irritable bowel syndrome, Other specified gastritis without mention of hemorrhage, Prostate cancer (HCC) (2022), Pulmonary fibrosis (HCC), and Stress fracture (02/2021).   reports that he has never smoked. He has never used smokeless tobacco.  Past Surgical History:  Procedure Laterality Date   ANTERIOR CERVICAL DECOMP/DISCECTOMY FUSION N/A 08/11/2022   Procedure: Anterior Cervical Decompression/Discectomy Fusion - Cervical Four-Cervical Five -  Cervical Five-Cervical Six - Cerivcal Six-Cerival Seven;  Surgeon: Julio Sicks, MD;  Location: [facility] OR;  Service: Neurosurgery;  Laterality: N/A;   CARPAL TUNNEL RELEASE     left   CATARACT EXTRACTION  06/2011   bilateral   DEEP NECK LYMPH NODE BIOPSY / EXCISION  03/25/2021   INGUINAL HERNIA REPAIR Right 09/05/2019   Procedure: OPEN REPAIR RIGHT INGUINAL HERNIA WITH MESH;  Surgeon: Darnell Level, MD;  Location: [facility] ORS;  Service: General;  Laterality: Right;   LUMBAR DISC  SURGERY  1986, 1995   x 2   LUMBAR LAMINECTOMY/DECOMPRESSION MICRODISCECTOMY Left 07/11/2013   Procedure: LUMBAR ONE TO TWO, LUMBAR TWO TO THREE, LUMBAR THREE TO FOUR LUMBAR LAMINECTOMY/DECOMPRESSION MICRODISCECTOMY 3 LEVELS;  Surgeon: Temple Pacini, MD;  Location: [facility] NEURO ORS;  Service: Neurosurgery;  Laterality: Left;   NASAL SINUS SURGERY     RIGHT/LEFT HEART CATH AND CORONARY ANGIOGRAPHY N/A 04/14/2022   Procedure: RIGHT/LEFT HEART CATH AND  CORONARY ANGIOGRAPHY;  Surgeon: Laurey Morale, MD;  Location: [facility] INVASIVE CV LAB;  Service: Cardiovascular;  Laterality: N/A;    No Known Allergies  Immunization History  Administered Date(s) Administered   Fluad Quad(high Dose 65+) 03/18/2020   Influenza Split 10/03/2009, 03/28/2010, 04/14/2011, 03/22/2012, 03/09/2013, 03/28/2014   Influenza, High Dose Seasonal PF 03/19/2016, 03/06/2017, 04/09/2018, 03/07/2019, 04/06/2020, 04/11/2022   Influenza, Quadrivalent, Recombinant, Inj, Pf 03/12/2018, 03/10/2019, 04/06/2020   Influenza,inj,Quad PF,6+ Mos 04/04/2014, 03/26/2015, 04/05/2016   Influenza-Unspecified 04/05/2016, 04/05/2021   PFIZER Comirnaty(Gray Top)Covid-19 Tri-Sucrose Vaccine 10/15/2020   PFIZER(Purple Top)SARS-COV-2 Vaccination 07/20/2019, 08/10/2019, 02/27/2020, 03/23/2021   Pneumococcal Conjugate-13 03/30/2013, 07/31/2013, 04/26/2018   Pneumococcal Polysaccharide-23 10/03/2009, 01/14/2010, 03/31/2018, 02/18/2021   Pneumococcal-Unspecified 04/05/2016   Td 10/20/2016   Tdap 10/03/2009   Zoster Recombinant(Shingrix) 01/03/2017   Zoster, Live 10/03/2009    Family History  Problem Relation Age of Onset   Heart failure Father 73   Bladder Cancer Mother    Breast cancer Mother    Colon cancer Neg Hx    Pancreatic cancer Neg Hx    Rectal cancer Neg Hx    Stomach cancer Neg Hx    Prostate cancer Neg Hx      Current Outpatient Medications:    acetaminophen (TYLENOL) 500 MG tablet, Take 1,000 mg by mouth every 6 (six) hours as  needed for moderate pain., Disp: , Rfl:    bismuth subsalicylate (PEPTO BISMOL) 262 MG/15ML suspension, Take 30 mLs by mouth every 6 (six) hours as needed for diarrhea or loose stools or indigestion., Disp: , Rfl:    carboxymethylcellulose (REFRESH TEARS) 0.5 % SOLN, Place 1 drop into both eyes daily as needed (dry eyes)., Disp: , Rfl:    Cholecalciferol (VITAMIN D-3) 1000 UNITS CAPS, Take 2,000 Units by mouth every evening., Disp: , Rfl:    CO-ENZYME Q-10 PO, Take 1 tablet by mouth every evening. , Disp: , Rfl:    Cyanocobalamin (VITAMIN B12) 1000 MCG TBCR, Take 1,000 mcg by mouth daily., Disp: , Rfl:    denosumab (PROLIA) 60 MG/ML SOSY injection, Inject 60 mg into the skin every 6 (six) months. At [facility], Disp: , Rfl:    desvenlafaxine (PRISTIQ) 50 MG 24 hr tablet, Take 50 mg by mouth daily., Disp: , Rfl:    diclofenac (VOLTAREN) 75 MG EC tablet, Take 75 mg by mouth 2 (two) times daily as needed (pain.)., Disp: , Rfl: 2   diclofenac sodium (VOLTAREN) 1 % GEL, Apply 4 g topically 4 (four) times daily as needed (pain.)., Disp: , Rfl: 12   dicyclomine (BENTYL) 10 MG capsule, Take one by mouth every 6 hours as needed for abdominal cramping, Disp: 30 capsule, Rfl: 1   diphenoxylate-atropine (LOMOTIL) 2.5-0.025 MG tablet, Take 1 tablet by mouth 4 (four) times daily as needed for diarrhea or loose stools., Disp: , Rfl:    Emollient (AQUAPHOR ADV PROTECT HEALING EX), Apply 1 Application topically daily as needed (itching)., Disp: , Rfl:    Evolocumab (REPATHA [place]), Inject 140 mg into the skin every 30 (thirty) days., Disp: , Rfl:    Fluticasone Propionate (XHANCE) 93 MCG/ACT EXHU, Place 1 spray into both nostrils 2 (two) times daily as needed (sinus)., Disp: , Rfl:    Glucosamine Sulfate 500 MG TABS, Take 500 mg by mouth daily., Disp: , Rfl:    HYDROcodone-acetaminophen (NORCO/VICODIN) 5-325 MG tablet, Take 1 tablet by mouth every 4 (four) hours as needed for moderate pain ((score 4 to 6)).,  Disp: 30 tablet, Rfl:  0   latanoprost (XALATAN) 0.005 % ophthalmic solution, Place 1 drop into both eyes at bedtime., Disp: , Rfl:    loratadine (CLARITIN) 10 MG tablet, Take 10 mg by mouth daily., Disp: , Rfl:    meclizine (ANTIVERT) 25 MG tablet, Take 25 mg by mouth 3 (three) times daily as needed for dizziness. , Disp: , Rfl:    methocarbamol (ROBAXIN) 500 MG tablet, Take 500 mg by mouth every 8 (eight) hours as needed (severe muscle spasms)., Disp: , Rfl:    metoprolol succinate (TOPROL XL) 25 MG 24 hr tablet, Take 1 tablet (25 mg total) by mouth 2 (two) times daily., Disp: 60 tablet, Rfl: 11   Multiple Vitamin (MULTIVITAMIN) tablet, Take 1 tablet by mouth daily., Disp: , Rfl:    omeprazole (PRILOSEC) 40 MG capsule, TAKE 1 CAPSULE (40 MG TOTAL) BY MOUTH DAILY., Disp: 90 capsule, Rfl: 0   ondansetron (ZOFRAN) 8 MG tablet, Take 1 tablet (8 mg total) by mouth every 8 (eight) hours as needed for nausea or vomiting., Disp: 30 tablet, Rfl: 1   Pirfenidone (ESBRIET) 801 MG TABS, Take 1 tablet by mouth 3 (three) times daily with meals., Disp: 270 tablet, Rfl: 1   Probiotic Product (ALIGN) 4 MG CAPS, Take 4 mg by mouth daily., Disp: , Rfl:       Objective:   Vitals:   01/04/23 0858  BP: 100/60  Pulse: 65  SpO2: 96%  Weight: 164 lb 9.6 oz (74.7 kg)  Height: 5\' 9"  (1.753 m)    Estimated body mass index is 24.31 kg/m as calculated from the following:   Height as of this encounter: 5\' 9"  (1.753 m).   Weight as of this encounter: 164 lb 9.6 oz (74.7 kg).  @WEIGHTCHANGE @  [employer]   01/04/23 0858  Weight: 164 lb 9.6 oz (74.7 kg)     Physical Exam   General: No distress. Looks well but thin O2 at rest: no Cane present: no Sitting in wheel chair: no Frail: no Obese: no Neuro: Alert and Oriented x 3. GCS 15. Speech normal Psych: Pleasant Resp:  Barrel Chest - no.  Wheeze - no, Crackles - RLL > LLL, No overt respiratory distress CVS: Normal heart sounds. Murmurs - no Ext:  Stigmata of Connective Tissue Disease - no HEENT: Normal upper airway. PEERL +. No post nasal drip        Assessment:       ICD-10-CM   1. 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HCC)  U98.119 Pulmonary function test    2. Encounter for therapeutic drug monitoring  Z51.81     3. Chronic fatigue  R53.82     4. Travel advice encounter  Z71.84          Plan:     Patient Instructions  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HCC) Encounter for therapeutic drug monitoring Nausea due to drug  -  IPF slowly getting worse  - LFT normal April 2024  Plan  - continue pirfenidone at full dose - monitor pulse ox even with exertion - goal >= 88%  - check LFT 01/04/2023 - next spirometry and dlco in 4 months  -consider clinical trials if you are eligble - sent your name to research team  Travel Advice  Plan - CMA 01/04/2023 to give you handicap form for parking   Fatigue - significant  in 2023 and on 01/04/2023 Weight loss - stopped as of April 2024 and July 2024 Nausea - some 10/13/2022 and 01/04/2023    -  symptoms are due to esbriet and medical issues  Plan - cotninue esbriet but manage symptoms as outlined before    Followup - 4 months - 30 min visit with Dr Marchelle Gearing but after spiromety and dlco  - symptom score and walk test at followup   ( Level 05 visit [name]&M 2024: Estb >= 40 min    in  visit type: on-site physical face to visit  in total care time and counseling or/and coordination of care by this undersigned MD - Dr Kalman Shan. This includes one or more of the following on this same day 01/04/2023: pre-charting, chart review, note writing, documentation discussion of test results, diagnostic or treatment recommendations, prognosis, risks and benefits of management options, instructions, education, compliance or risk-factor reduction. It excludes time spent by the CMA or office staff in the care of the patient. Actual time 42 min)    SIGNATURE    Dr. Kalman Shan,  M.D., F.C.C.P,  Pulmonary and Critical Care Medicine Staff Physician, [facility] Center Director - Interstitial Lung Disease  Program  Pulmonary Fibrosis [facility] at [facility] [place], [place], [address]  Pager: [phone], If no answer or between  15:00h - 7:00h: call [phone] Telephone: [phone]  4:50 PM 01/04/2023

## 2023-01-04 NOTE — Patient Instructions (Addendum)
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HCC) Encounter for therapeutic drug monitoring Nausea due to drug  -  IPF slowly getting worse  - LFT normal April 2024  Plan  - continue pirfenidone at full dose - monitor pulse ox even with exertion - goal >= 88%  - check LFT 01/04/2023 - next spirometry and dlco in 4 months  -consider clinical trials if you are eligble - sent your name to research team  Travel Advice  Plan - CMA 01/04/2023 to give you handicap form for parking   Fatigue - significant  in 2023 and on 01/04/2023 Weight loss - stopped as of April 2024 and July 2024 Nausea - some 10/13/2022 and 01/04/2023    -symptoms are due to esbriet and medical issues  Plan - cotninue esbriet but manage symptoms as outlined before    Followup - 4 months - 30 min visit with Dr Marchelle Gearing but after spiromety and dlco  - symptom score and walk test at followup

## 2023-01-04 NOTE — Telephone Encounter (Signed)
Spoke with patient confirming upcoming appointment change  

## 2023-01-05 ENCOUNTER — Inpatient Hospital Stay: Payer: PPO | Admitting: Oncology

## 2023-01-05 ENCOUNTER — Inpatient Hospital Stay: Payer: PPO

## 2023-01-05 ENCOUNTER — Other Ambulatory Visit: Payer: Self-pay | Admitting: Oncology

## 2023-01-12 ENCOUNTER — Inpatient Hospital Stay: Payer: PPO | Admitting: Oncology

## 2023-01-12 ENCOUNTER — Inpatient Hospital Stay: Payer: PPO | Attending: Oncology

## 2023-01-12 VITALS — BP 120/80 | HR 88 | Temp 98.1°F | Resp 20 | Ht 69.0 in | Wt 165.3 lb

## 2023-01-12 DIAGNOSIS — K589 Irritable bowel syndrome without diarrhea: Secondary | ICD-10-CM | POA: Insufficient documentation

## 2023-01-12 DIAGNOSIS — C61 Malignant neoplasm of prostate: Secondary | ICD-10-CM | POA: Insufficient documentation

## 2023-01-12 DIAGNOSIS — C8330 Diffuse large B-cell lymphoma, unspecified site: Secondary | ICD-10-CM

## 2023-01-12 DIAGNOSIS — Z79899 Other long term (current) drug therapy: Secondary | ICD-10-CM | POA: Insufficient documentation

## 2023-01-12 DIAGNOSIS — R0609 Other forms of dyspnea: Secondary | ICD-10-CM | POA: Insufficient documentation

## 2023-01-12 DIAGNOSIS — M545 Low back pain, unspecified: Secondary | ICD-10-CM | POA: Insufficient documentation

## 2023-01-12 DIAGNOSIS — C8331 Diffuse large B-cell lymphoma, lymph nodes of head, face, and neck: Secondary | ICD-10-CM | POA: Insufficient documentation

## 2023-01-12 DIAGNOSIS — K5730 Diverticulosis of large intestine without perforation or abscess without bleeding: Secondary | ICD-10-CM | POA: Insufficient documentation

## 2023-01-12 DIAGNOSIS — D649 Anemia, unspecified: Secondary | ICD-10-CM | POA: Insufficient documentation

## 2023-01-12 DIAGNOSIS — K648 Other hemorrhoids: Secondary | ICD-10-CM | POA: Insufficient documentation

## 2023-01-12 LAB — CBC WITH DIFFERENTIAL (CANCER CENTER ONLY)
Abs Immature Granulocytes: 0.01 10*3/uL (ref 0.00–0.07)
Basophils Absolute: 0 10*3/uL (ref 0.0–0.1)
Basophils Relative: 0 %
Eosinophils Absolute: 0.4 10*3/uL (ref 0.0–0.5)
Eosinophils Relative: 8 %
HCT: 35.6 % — ABNORMAL LOW (ref 39.0–52.0)
Hemoglobin: 12.2 g/dL — ABNORMAL LOW (ref 13.0–17.0)
Immature Granulocytes: 0 %
Lymphocytes Relative: 17 %
Lymphs Abs: 0.9 10*3/uL (ref 0.7–4.0)
MCH: 31.8 pg (ref 26.0–34.0)
MCHC: 34.3 g/dL (ref 30.0–36.0)
MCV: 92.7 fL (ref 80.0–100.0)
Monocytes Absolute: 0.5 10*3/uL (ref 0.1–1.0)
Monocytes Relative: 9 %
Neutro Abs: 3.6 10*3/uL (ref 1.7–7.7)
Neutrophils Relative %: 66 %
Platelet Count: 209 10*3/uL (ref 150–400)
RBC: 3.84 MIL/uL — ABNORMAL LOW (ref 4.22–5.81)
RDW: 13 % (ref 11.5–15.5)
WBC Count: 5.5 10*3/uL (ref 4.0–10.5)
nRBC: 0 % (ref 0.0–0.2)

## 2023-01-12 LAB — CMP (CANCER CENTER ONLY)
ALT: 12 U/L (ref 0–44)
AST: 19 U/L (ref 15–41)
Albumin: 4.2 g/dL (ref 3.5–5.0)
Alkaline Phosphatase: 44 U/L (ref 38–126)
Anion gap: 4 — ABNORMAL LOW (ref 5–15)
BUN: 12 mg/dL (ref 8–23)
CO2: 30 mmol/L (ref 22–32)
Calcium: 9 mg/dL (ref 8.9–10.3)
Chloride: 101 mmol/L (ref 98–111)
Creatinine: 0.69 mg/dL (ref 0.61–1.24)
GFR, Estimated: >60 mL/min (ref >60)
Glucose, Bld: 158 mg/dL — ABNORMAL HIGH (ref 70–99)
Potassium: 4.1 mmol/L (ref 3.5–5.1)
Sodium: 135 mmol/L (ref 135–145)
Total Bilirubin: 0.5 mg/dL (ref 0.3–1.2)
Total Protein: 6.6 g/dL (ref 6.5–8.1)

## 2023-01-12 LAB — LACTATE DEHYDROGENASE: LDH: 203 U/L — ABNORMAL HIGH (ref 98–192)

## 2023-01-12 NOTE — Progress Notes (Signed)
[facility] OFFICE PROGRESS NOTE   Diagnosis: Non-Hodgkin's lymphoma  INTERVAL HISTORY:   Kenneth [name] returns as scheduled.  No fever or night sweats.  Good appetite.  No palpable lymph nodes.  He continues to have exertional dyspnea.  He reports increased low back pain and plans to receive a "injection" later this week.  He is scheduled for follow-up with urology for prostate cancer.  Objective:  Vital signs in last 24 hours:  Blood pressure 120/80, pulse 88, temperature 98.1 F (36.7 C), temperature source Oral, resp. rate 20, height 5\' 9"  (1.753 m), weight 165 lb 4.8 oz (75 kg), SpO2 98 %.    Lymphatics: No cervical, supraclavicular, axillary, or inguinal nodes Resp: Lungs with bilateral end inspiratory rales, no respiratory distress Cardio: Regular rhythm with premature beats GI: No hepatosplenomegaly Vascular: No leg edema   Lab Results:  Lab Results  Component Value Date   WBC 5.5 01/12/2023   HGB 12.2 (L) 01/12/2023   HCT 35.6 (L) 01/12/2023   MCV 92.7 01/12/2023   PLT 209 01/12/2023   NEUTROABS 3.6 01/12/2023    CMP  Lab Results  Component Value Date   NA 131 (L) 12/01/2022   K 4.1 12/01/2022   CL 98 12/01/2022   CO2 28 12/01/2022   GLUCOSE 123 (H) 12/01/2022   BUN 16 12/01/2022   CREATININE 0.97 12/01/2022   CALCIUM 8.6 (L) 12/01/2022   PROT 6.9 10/06/2022   ALBUMIN 4.4 10/06/2022   AST 18 10/06/2022   ALT 11 10/06/2022   ALKPHOS 39 10/06/2022   BILITOT 0.5 10/06/2022   GFRNONAA >60 12/01/2022   GFRAA >60 06/01/2017    Medications: I have reviewed the patient's current medications.   Assessment/Plan: Non-Hodgkin's lymphoma CTs neck and chest 03/19/2021-cluster of left supraclavicular nodes measuring less than 1 cm, 12 mm right node next to the jugular vein, chest with slight interval progression of pulmonary fibrosis, new small sclerotic lesion in the right third rib concerning for metastasis, lucent lesion in T8 corresponding to  abnormal uptake on a bone scan 05/21/2020 suspicious for malignancy, subpectoral nodes-subcentimeter in size but larger than 03/28/2019 FNA of left neck node 03/25/2021-atypical lymphoid proliferation Excisional biopsy of level 5 left neck node 04/07/2021-lymph node with marked autolysis and diffusely infiltrated by atypical lymphocytes, 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 Cycle 1 R-mini CHOP 07/10/2021 Cycle 2 R-mini CHOP 07/31/2021 Cycle 3 R-mini CHOP 08/21/2021 Cycle 4 R-mini CHOP 09/11/2021 PET 09/29/2021-near complete resolution of hypermetabolic lymph nodes, no remaining hypermetabolic disease, wall thickening and hypermetabolic activity at the transverse and sigmoid colon Cycle 5 R-mini CHOP 10/02/2021 Cycle 6 R-mini CHOP 10/23/2021, Udenyca 10/24/2021 Prostate cancer-T3 adenocarcinoma, Gleason 5+4, external beam XRT 09/12/2020 -  11/07/2020, maintained on androgen deprivation  therapy 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  Anemia-chronic 7.  C4-5, C5-6, C6-7-anterior cervical discectomy and fusion 08/11/2022       Disposition: Kenneth [name] is in clinical remission from non-Hodgkin's lymphoma.  He has persistent mild normocytic anemia.  The anemia is likely secondary to the course of chemotherapy last year, treatment of prostate cancer, and chronic disease.  He will return for an office and lab visit in 4 months.  He will call in the interim for new symptoms.  Kenneth Papas, MD  01/12/2023  8:12 AM

## 2023-01-15 DIAGNOSIS — M5416 Radiculopathy, lumbar region: Secondary | ICD-10-CM | POA: Diagnosis not present

## 2023-01-19 ENCOUNTER — Telehealth: Payer: Self-pay | Admitting: Internal Medicine

## 2023-01-19 DIAGNOSIS — C61 Malignant neoplasm of prostate: Secondary | ICD-10-CM | POA: Diagnosis not present

## 2023-01-19 DIAGNOSIS — M25562 Pain in left knee: Secondary | ICD-10-CM | POA: Diagnosis not present

## 2023-01-19 NOTE — Telephone Encounter (Signed)
Opt came in to drop off his Renewal of Disability Parking placard application. Dropped off in Dr. Marchelle Gearing mailbox

## 2023-01-21 NOTE — Telephone Encounter (Signed)
Pt called back in to see if his form has been signed, any updates ?

## 2023-01-22 DIAGNOSIS — M25562 Pain in left knee: Secondary | ICD-10-CM | POA: Diagnosis not present

## 2023-01-26 DIAGNOSIS — C61 Malignant neoplasm of prostate: Secondary | ICD-10-CM | POA: Diagnosis not present

## 2023-01-26 DIAGNOSIS — R3915 Urgency of urination: Secondary | ICD-10-CM | POA: Diagnosis not present

## 2023-01-27 NOTE — Telephone Encounter (Signed)
This was taking care of 01/26/2023. Nothing further needed.

## 2023-02-01 ENCOUNTER — Other Ambulatory Visit: Payer: Self-pay | Admitting: Pharmacist

## 2023-02-01 DIAGNOSIS — J84112 Idiopathic pulmonary fibrosis: Secondary | ICD-10-CM

## 2023-02-01 MED ORDER — PIRFENIDONE 801 MG PO TABS
1.0000 | ORAL_TABLET | Freq: Three times a day (TID) | ORAL | 1 refills | Status: DC
Start: 2023-02-01 — End: 2023-03-25

## 2023-02-01 NOTE — Telephone Encounter (Signed)
Refill sent for PIRFENIDONE to [facility] (pulmonary fibrosis team). [phone]  Dose: 801 mg three times daily  Last OV: 01/04/23 Provider: Dr. Marchelle Gearing  Next OV: not yet scheduled  LFTs 01/12/23 wnl  Chesley Mires, PharmD, MPH, BCPS Clinical Pharmacist (Rheumatology and Pulmonology)

## 2023-02-03 DIAGNOSIS — H401121 Primary open-angle glaucoma, left eye, mild stage: Secondary | ICD-10-CM | POA: Diagnosis not present

## 2023-02-03 DIAGNOSIS — H401112 Primary open-angle glaucoma, right eye, moderate stage: Secondary | ICD-10-CM | POA: Diagnosis not present

## 2023-02-05 DIAGNOSIS — M25562 Pain in left knee: Secondary | ICD-10-CM | POA: Diagnosis not present

## 2023-02-10 DIAGNOSIS — H401112 Primary open-angle glaucoma, right eye, moderate stage: Secondary | ICD-10-CM | POA: Diagnosis not present

## 2023-02-10 DIAGNOSIS — M2242 Chondromalacia patellae, left knee: Secondary | ICD-10-CM | POA: Diagnosis not present

## 2023-02-10 DIAGNOSIS — S83272A Complex tear of lateral meniscus, current injury, left knee, initial encounter: Secondary | ICD-10-CM | POA: Diagnosis not present

## 2023-02-10 DIAGNOSIS — M5416 Radiculopathy, lumbar region: Secondary | ICD-10-CM | POA: Diagnosis not present

## 2023-03-15 DIAGNOSIS — L570 Actinic keratosis: Secondary | ICD-10-CM | POA: Diagnosis not present

## 2023-03-15 DIAGNOSIS — L820 Inflamed seborrheic keratosis: Secondary | ICD-10-CM | POA: Diagnosis not present

## 2023-03-15 DIAGNOSIS — L905 Scar conditions and fibrosis of skin: Secondary | ICD-10-CM | POA: Diagnosis not present

## 2023-03-15 DIAGNOSIS — Z85828 Personal history of other malignant neoplasm of skin: Secondary | ICD-10-CM | POA: Diagnosis not present

## 2023-03-15 DIAGNOSIS — L821 Other seborrheic keratosis: Secondary | ICD-10-CM | POA: Diagnosis not present

## 2023-03-15 DIAGNOSIS — D485 Neoplasm of uncertain behavior of skin: Secondary | ICD-10-CM | POA: Diagnosis not present

## 2023-03-22 ENCOUNTER — Encounter: Payer: PPO | Admitting: *Deleted

## 2023-03-22 ENCOUNTER — Other Ambulatory Visit: Payer: Self-pay | Admitting: Oncology

## 2023-03-22 ENCOUNTER — Other Ambulatory Visit: Payer: Self-pay | Admitting: Internal Medicine

## 2023-03-22 DIAGNOSIS — Z006 Encounter for examination for normal comparison and control in clinical research program: Secondary | ICD-10-CM

## 2023-03-22 DIAGNOSIS — J84112 Idiopathic pulmonary fibrosis: Secondary | ICD-10-CM

## 2023-03-22 NOTE — Research (Cosign Needed)
Title:A Randomized, Double-blind, Placebo-controlled, Phase 3 Study of the Efficacy and Safety of Inhaled Treprostinil in Subjects with Idiopathic Pulmonary Fibrosis   Dose and Duration of Treatment:Treprostinil for Inhalation 0.6 mg/mL, or placebo (randomly assigned 1:1) over a 52 week period.  Protocol # RIN-PF-301; IND # [id number]; Clinical Trials.gov Identifier: [id number]  Sponsor: [employer]Research [place], [place] [address]  Key Features of the Drug: Treprostinil (LRX-15, 15AU81, UT-15) is a stable tricyclic benzindene analogue of the naturally occurring prostacyclin (PGI2; epoprostenol), a member of the eicosanoid family of autocoids; these compounds occur widely in tissues and have important pharmacological properties with potent activity, especially on the cardiovascular system and smooth muscle [name] 1996). An inhalation solution of treprostinil, 0.6 mg/mL, delivered using an ultrasonic nebulizer, is currently FDA approved to treat pulmonary arterial hypertension (PAH); [WHO Group 1, PH-ILD; WHO Group 3. Inhaled treprostinil is NOW being evaluated as primary treatment of IPF, idiopathic pulmonary fibrosis.   Key Inclusion Criteria: >=68 years of age Diagnosis of IPF based on the 2018 ATS/ERS/JRS/ALAT Clinical Practice Guideline (FVC >=45% predicted at Screening).  Subjects on pirfenidone or nintedanib must be on a stable and optimized dose for >=30 days prior to Baseline. Concomitant use of both pirfenidone and nintedanib is not permitted. Males with a partner of childbearing potential must use a condom for the duration of treatment and for at least 48 hours after discontinuing study drug.   Key Exclusion Criteria: Subject is pregnant or lactating.  FEV1/FVC <0.70 at Screening.  Prior intolerance or significant lack of efficacy to a prostacyclin or prostacyclin analogue that resulted in discontinuation or inability to effectively titrate that therapy.  The subject  has received any PAH-approved therapy, including prostacyclin therapy (epoprostenol, treprostinil, iloprost, or beraprost; except for acute vasoreactivity testing), IP receptor agonists (selexipag), endothelin receptor antagonists, phosphodiesterase type 5 inhibitors (PDE5-Is), or soluble guanylate cyclase stimulators within 60 days prior to Baseline. As needed use of a PDE5-I for erectile dysfunction is permitted, provided no doses are taken within 48 hours of any study-related efficacy assessments.  Use of any of the following medications:  `     a. Azathioprine (AZA), cyclosporine, mycophenolate mofetil, tacroliumus, oral           corticosteroids (OCS) >20 mg/day or the combination of            OCS+AZA+N-acetylcysteine within 30 days prior to Baseline.        b. Cyclophosphamide within 60 days prior to Baseline        c. Rituximab within 6 months prior to Baseline   Receiving >10 L/min of oxygen at rest at Baseline.   Exacerbation of IPF or active pulmonary or upper respiratory infection within 30 days prior to Baseline. Subjects must have completed any antibiotic or steroid regimens for treatment of the infection or acute exacerbation more than 30 days prior to Baseline to be eligible. If hospitalized for an acute exacerbation of IPF or a pulmonary or upper respiratory infection, subjects must have been discharged more than 90 days prior to Baseline to be eligible.  Uncontrolled cardiac disease, defined as myocardial infarction within 6 months prior to Baseline or unstable angina within 30 days prior to Baseline. Use of any investigational drug/device or participation in any investigational study in which the subject received a medical intervention (ie, procedure, device, medication/supplement) within 30 days prior to Screening. Subjects participating in non-interventional, observational, or registry studies are eligible.  Life expectancy <6 months due to IPF or a concomitant illness.  Acute  pulmonary embolism within 90 days prior to Baseline.   Pharmacodynamics:  (Dose-Response Relationships) - In a clinical study of 241 healthy volunteers, single doses of Tyvaso 54 ?g (the target maintenance dose per session) and 84 ?g (supratherapeutic inhalation dose) prolonged the QTc interval by approximately 10 ms. The QTc effect dissipated rapidly as the concentration of treprostinil decreased.  Pharmacokinetics: (ADME) - Treprostinil is substantially metabolized by the liver, primarily by CYP2C8.  The elimination of treprostinil (following Alexander administration of treprostinil) is biphasic, with a terminal elimination t1?2 of approximately 4 hours.    Contraindications:   None  Special Warnings/Considerations: Treprostinil undergoes substantial hepatic metabolism; thus, liver impairment could result in decreased metabolism and increased systemic exposure to treprostinil potentiating the occurrence and/or severity of AEs and/or overdose. There are no adequate and well-controlled studies with Tyvaso in pregnant women. There are risks to the mother and the fetus associated with PAH. Patients who discontinue the use of any effective therapy may be expected to notice a decline in their clinical status. Patients who abruptly discontinue therapy (ie, miss 2 or more doses), or markedly reduce the dose, are at risk for worsening of PAH symptoms or a rebound in [facility]. There is no evidence to suggest or suspect that patients taking inhaled treprostinil would be at risk for withdrawal symptoms or rebound effects.  Treprostinil inhibits platelet aggregation and increases the risk of bleeding, particularly with concomitant anticoagulants and antiplatelet therapies. Treprostinil is a pulmonary and systemic vasodilator. In patients with low systemic arterial pressure, inhaled treprostinil solution may cause symptomatic hypotension, which may present as but is not limited to light-headedness, dizziness, blurred or  faded vision, and syncope. Concomitant administration of treprostinil with diuretics, antihypertensive agents, or other vasodilators that may lower blood pressure increases the risk of symptomatic hypotension. Signs and symptoms of overdose with inhaled treprostinil solution may correspond to dose-limiting pharmacologic effects, including diarrhea, flushing, headache, hypotension, nausea, and vomiting. Most events are self-limiting and resolve with reduction or withholding of inhaled treprostinil solution.  Interactions:              Drug Interaction Studies Conducted with Treprostinil: Study # P01:08: Treprostinil formulation [place] Remodulin vs Acetaminophen, to evaluate effect of acetaminophen on Treprostinil pK, showed no effect. Study # P01:12: Treprostinil formulation [place] Remodulin vs Warfarin, to evaluate effect of Warfarin on Treprostinil pK/PD, showed no effect. Study TDE-PH-105: Treprostinil formulation Orenitram vs Bosentan, to evaluate effect of Bosentan on Treprostinil pK, showed no effect. Study TDE-PH-106: Treprostinil formulation Orenitram vs Sildenafil, to evaluate effect of Sildenafil on Treprostinil pK, showed no effect. Study TDE-PH-109: Treprostinil formulation Orenitram vs Rifampicin (CYP2C8/9 inducer), to evaluate the effect of inducing the CYP2C8 metabolic pathway on the pK of Treprostinil, confirmed metabolic pathway via CYP2C8/9. Expected interaction resulting in reduced Cmax and AUC. Study TDE-PH-110: Treprostinil formulation Orenitram vs Gemfibrozil (CYP2C8  inhibitor), to evaluate the effect of inhibiting the CYP2C8 metabolic pathway on the pK of Treprostinil, confirmed metabolic pathway primarily via CYP2C8. Expected interaction resulting in significantly increased Cmax and AUC. Study TDE-PH-110: Treprostinil formulation Orenitram vs Fluconazole (CYP2C9 inhibitor), to evaluate the effect of inhibiting the CYP2C9 metabolic pathway on on the pK of Treprostinil, had no notable  effect. Confirmed CYP2C9 plays a minor role in treprostinil metabolism.  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 No serious adverse reactions met criteria for inclusion in inhaled treprostinil solution clinical studies for interstitial lung disease. System/Organ  Class: Nervous System disorders - Syncope in 3 of 115 subjects exposed (2.6%)      Serious Adverse Reactions Observed Postmarketing: Gastrointestinal disorders: vomiting, nausea, diarrhea, GI Hemorrhage (reflects an aggregate of the following: GI hemorrhage, rectal hemorrhage, blood in stool [hematochezia or melena], and hematemesis). General disorders and administration site conditions: Pain Infections and infestations: Pneumonia Nervous system disorders: Syncope, dizziness, headache Respiratory, thoracic and mediastinal disorders: Hemoptysis, cough, epistaxis Vascular disorders: Hypotension, hemorrhage (unspecified blood loss ranging from nondescript to heavy or excessive bleeding).  Safety Data:  As of 20 January 2021, approximately 955 subjects have received treprostinil by inhalation across various studies, ranging from acute studies in healthy volunteers to long-term treatment in subjects with PAH and PH-ILD.   The estimate of worldwide exposure to treprostinil in the marketed inhaled formulations is approximately 78,[phone] total patient treatment-days (approximately 34,318 patient treatment-years). The most common adverse effects of inhaled treprostinil >=10% were cough, diarrhea, dizziness, flushing, headache, muscle/jaw/bone pain, nausea, pharyngolaryngeal pain, oropharyngeal pain, syncope, and throat irritation. Other adverse reactions in the observational study comparing subjects taking inhaled treprostinil and a control group were cough, hemoptysis, nasal discomfort, and throat  irritation. Angioedema is a rare but serious reaction that has been seen in the post-marketing setting with treprostinil.  Stability:  Ampoules of drug product are stable until the date indicated when stored in the unopened foil pouch. The [place] commercial labeling states the following regarding storage conditions: Store at 20C to 25C (24F to 20F), with excursions permitted to 15C to 30C (35F to 92F).  Once the foil pack is opened, ampoules should be used within 7 days. Because Tyvaso is light-sensitive, unopened ampoules should be stored in the foil pouch.   [facility] [place] Clinical Research Coordinator note:   This visit for [name] with DOB: Dec 16, 1940 on 03/22/2023 for the above protocol is Visit/Encounter # Screening Visit  and is for purpose of research.   The consent for this encounter is under Protocol Version : Amendment 4 dated: 26Jul2023 IB: Version 19 dated 08Jul2024 ICF: Ver. 3.0, 05 Feb 2022, WCG Ver. 3.1, 02 Apr 2022, IRB APPROVED AS MODIFIED Apr 07, 2022 Lab Manual: V4 dated 07Jun2023  Kenneth [name]  (Subject) expressed continued interest and consent in continuing as a study subject. Subject confirmed that there was  No change in contact information (e.g. address, telephone, email). Subject thanked for participation in research and contribution to science. In this visit 03/22/2023 the subject will be evaluated by Dr Marga Melnick [facility]). This research coordinator has verified that the above investigator is up to date with his/her training logs.   The Subject was informed that the PI Murali Ramaswamy,MD continues to have oversight of the subject's visits and course through relevant discussions, reviews, and also specifically of this visit by routing of this note to the PI.  1. This visit is a key visit of screening. The PI is NOT available for this visit.    Because the PI is NOT available, the Sub-Investigator Dr Chrissie Noa Hopper,reported and CRC  has confirmed that the PI Murali Ramaswamy (HAS) discussed the visit a-priori with the Sub-Investigator.  2.  In addition, ahead of the key visit of screening the visit and subject were discussed with the [facility], on date of 4 Sept 2024 face to face as part of direct PI oversight.  All assessments were done as per the protocol. Further details in the binder.    Signed by  Neita Garnet  Clinical Research  Coordinator [facility]  [place], [place] 10:25 AM 03/22/2023

## 2023-03-25 ENCOUNTER — Other Ambulatory Visit: Payer: Self-pay | Admitting: Pharmacist

## 2023-03-25 DIAGNOSIS — R0602 Shortness of breath: Secondary | ICD-10-CM | POA: Diagnosis not present

## 2023-03-25 DIAGNOSIS — J84112 Idiopathic pulmonary fibrosis: Secondary | ICD-10-CM

## 2023-03-25 MED ORDER — PIRFENIDONE 801 MG PO TABS
1.0000 | ORAL_TABLET | Freq: Three times a day (TID) | ORAL | 1 refills | Status: DC
Start: 2023-03-25 — End: 2023-11-26

## 2023-03-25 NOTE — Telephone Encounter (Signed)
Refill for pirfenidone sent to [facility].  LFTs on 01/12/23 wnl Dose: 801mg  three times daily

## 2023-03-25 NOTE — Progress Notes (Signed)
Kenneth [name] ,DOB 15-Jan-1941,was seen as potential subject in a clinical trial /Protocol #RIN-PF-301 He states that there has been some subtle progression of his exertional dyspnea.  He does notice PVCs without significant increase in frequency.  Fatigue is a chronic issue.  He has variable stool consistency  from loose-constipation related to his present IPF therapy.  This is also associated with chronic nausea. There is slight accentuation of the second heart sound at the base.  He has a grade 1.5 systolic murmur at the right base.  Occasional pauses noted in the rhythm.  He has fine rales at the lower one third of the right posterior thorax with minimal rales in the left lower lobe. All physical findings NCS                                                                     Pecola Lawless MD,SI

## 2023-04-01 DIAGNOSIS — M47816 Spondylosis without myelopathy or radiculopathy, lumbar region: Secondary | ICD-10-CM | POA: Diagnosis not present

## 2023-04-07 DIAGNOSIS — Z23 Encounter for immunization: Secondary | ICD-10-CM | POA: Diagnosis not present

## 2023-04-08 ENCOUNTER — Ambulatory Visit (HOSPITAL_BASED_OUTPATIENT_CLINIC_OR_DEPARTMENT_OTHER)
Admission: RE | Admit: 2023-04-08 | Discharge: 2023-04-08 | Disposition: A | Discharge status: Home / Self Care | Payer: PPO | Source: Ambulatory Visit | Attending: Internal Medicine | Admitting: Internal Medicine

## 2023-04-08 DIAGNOSIS — I700 Atherosclerosis of aorta: Secondary | ICD-10-CM | POA: Diagnosis not present

## 2023-04-08 DIAGNOSIS — J84112 Idiopathic pulmonary fibrosis: Secondary | ICD-10-CM | POA: Diagnosis not present

## 2023-04-08 DIAGNOSIS — Z006 Encounter for examination for normal comparison and control in clinical research program: Secondary | ICD-10-CM

## 2023-04-19 DIAGNOSIS — M47816 Spondylosis without myelopathy or radiculopathy, lumbar region: Secondary | ICD-10-CM | POA: Diagnosis not present

## 2023-04-20 ENCOUNTER — Other Ambulatory Visit: Payer: Self-pay | Admitting: Internal Medicine

## 2023-04-21 ENCOUNTER — Telehealth: Payer: Self-pay
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
## 2023-04-23 NOTE — Telephone Encounter (Signed)
On the morning of 04/21/2023 I personally spoke to the subject.  Went over the intent of the study [facility] 301.  Went over the data on the phone.  Went over standard study subject expectations including need to follow-up protocol requirements and also maintain window.  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  But did tell him that while he may try to get therapy to gain the primary purpose would be to for the benefit of society.  Did indicate to him this and established drug that is being repurpose for IPF and being study.  Went over the side effect profile.  Went over all the study concepts.  He is willing to participate in the study and follow all study procedures.    1. Scientific Purpose  Clinical research is designed to produce generalizable knowledge and to answer questions about the safety and efficacy of intervention(s) under study in order to determine whether or not they may be useful for the care of future patients.  2. Study Procedures  Participation in a trial may involve procedures or tests, in addition to the intervention(s) under study, that are intended only or primarily to generate scientific knowledge and that are otherwise not necessary for patient care.   3. Uncertainty  For intervention(s) under study in clinical research, there often is less knowledge and more uncertainty about the risks and benefits to a population of trial participants than there is when a doctor offers a patient standard interventions.   4. Adherence to Protocol  Administration of the intervention(s) under study is typically based on a strict protocol with defined dose, scheduling, and use or avoidance of concurrent medications, compared to administration of standard interventions.  5. Clinician as Investigator  Clinicians who are in health care settings provide treatment; in a clinical trial setting, they are also investigating safety and efficacy of an intervention. In otherwise your doctor or nurse practitioner  can be wearing 2 hats - one as care giver another as Oceanographer  6. Patient as Engineer, agricultural Subject  Patients participating in research trials are research subjects or volunteers. In other words participating in research is 100% voluntary and at one's own free weill. The decision to participate or not participate will NOT affect patient care and the doctor-patient relationship in any way  7. Financial Conflict of Interest Disclosure  One or more of the investigators of  the research trial might have an investment interest in [facility], [facility] the clinical trials practice through which the study is being conducted. Study sponsor pays the practice for the conduct of the study.     SIGNATURE    Dr. Kalman Shan, M.D., F.C.C.P, ACRP-CPI Pulmonary and Critical Care Medicine Research Investigator, [facility] Health Staff Physician, [facility] Center Director - Interstitial Lung Disease  Program  Pulmonary Fibrosis [facility] [place] [facility] and [facility] @ [facility] [place], [place], [address]   Pager: [phone], If no answer  OR between  19:00-7:00h: page [phone] Telephone (research): [phone]  4:51 PM 04/23/2023   4:51 PM 04/23/2023

## 2023-04-27 DIAGNOSIS — Z0001 Encounter for general adult medical examination with abnormal findings: Secondary | ICD-10-CM | POA: Diagnosis not present

## 2023-04-27 DIAGNOSIS — Z0189 Encounter for other specified special examinations: Secondary | ICD-10-CM | POA: Diagnosis not present

## 2023-04-27 DIAGNOSIS — R7301 Impaired fasting glucose: Secondary | ICD-10-CM | POA: Diagnosis not present

## 2023-04-27 DIAGNOSIS — J8410 Pulmonary fibrosis, unspecified: Secondary | ICD-10-CM | POA: Diagnosis not present

## 2023-04-27 DIAGNOSIS — C61 Malignant neoplasm of prostate: Secondary | ICD-10-CM | POA: Diagnosis not present

## 2023-04-27 DIAGNOSIS — M810 Age-related osteoporosis without current pathological fracture: Secondary | ICD-10-CM | POA: Diagnosis not present

## 2023-04-28 DIAGNOSIS — M47816 Spondylosis without myelopathy or radiculopathy, lumbar region: Secondary | ICD-10-CM | POA: Diagnosis not present

## 2023-04-28 NOTE — Progress Notes (Signed)
1) asymptomatic gall strone 2) fibrosis stable since early 2024 but progressive in 2 years. 3) will discuss at followup

## 2023-04-29 DIAGNOSIS — R82998 Other abnormal findings in urine: Secondary | ICD-10-CM | POA: Diagnosis not present

## 2023-04-30 ENCOUNTER — Other Ambulatory Visit (HOSPITAL_COMMUNITY): Payer: Self-pay | Admitting: *Deleted

## 2023-05-03 ENCOUNTER — Encounter (HOSPITAL_COMMUNITY)
Admission: RE | Admit: 2023-05-03 | Discharge: 2023-05-03 | Disposition: A | Discharge status: Home / Self Care | Payer: PPO | Source: Ambulatory Visit | Attending: Internal Medicine | Admitting: Internal Medicine

## 2023-05-03 DIAGNOSIS — I712 Thoracic aortic aneurysm, without rupture, unspecified: Secondary | ICD-10-CM | POA: Diagnosis not present

## 2023-05-03 DIAGNOSIS — M810 Age-related osteoporosis without current pathological fracture: Secondary | ICD-10-CM | POA: Insufficient documentation

## 2023-05-03 DIAGNOSIS — I272 Pulmonary hypertension, unspecified: Secondary | ICD-10-CM | POA: Diagnosis not present

## 2023-05-03 DIAGNOSIS — M1990 Unspecified osteoarthritis, unspecified site: Secondary | ICD-10-CM | POA: Diagnosis not present

## 2023-05-03 DIAGNOSIS — J84112 Idiopathic pulmonary fibrosis: Secondary | ICD-10-CM | POA: Diagnosis not present

## 2023-05-03 DIAGNOSIS — I493 Ventricular premature depolarization: Secondary | ICD-10-CM | POA: Diagnosis not present

## 2023-05-03 DIAGNOSIS — C8510 Unspecified B-cell lymphoma, unspecified site: Secondary | ICD-10-CM | POA: Diagnosis not present

## 2023-05-03 DIAGNOSIS — I2510 Atherosclerotic heart disease of native coronary artery without angina pectoris: Secondary | ICD-10-CM | POA: Diagnosis not present

## 2023-05-03 DIAGNOSIS — E785 Hyperlipidemia, unspecified: Secondary | ICD-10-CM | POA: Diagnosis not present

## 2023-05-03 DIAGNOSIS — M48061 Spinal stenosis, lumbar region without neurogenic claudication: Secondary | ICD-10-CM | POA: Diagnosis not present

## 2023-05-03 DIAGNOSIS — C8598 Non-Hodgkin lymphoma, unspecified, lymph nodes of multiple sites: Secondary | ICD-10-CM | POA: Diagnosis not present

## 2023-05-03 DIAGNOSIS — Z1339 Encounter for screening examination for other mental health and behavioral disorders: Secondary | ICD-10-CM | POA: Diagnosis not present

## 2023-05-03 DIAGNOSIS — Z1331 Encounter for screening for depression: Secondary | ICD-10-CM | POA: Diagnosis not present

## 2023-05-03 DIAGNOSIS — Z0000 Encounter for general adult medical examination without abnormal findings: Secondary | ICD-10-CM | POA: Diagnosis not present

## 2023-05-03 MED ORDER — DENOSUMAB 60 MG/ML ~~LOC~~ SOSY
60.0000 mg | PREFILLED_SYRINGE | Freq: Once | SUBCUTANEOUS | Status: AC
  Administered 2023-05-03: 60 mg via SUBCUTANEOUS

## 2023-05-03 MED ORDER — DENOSUMAB 60 MG/ML ~~LOC~~ SOSY
PREFILLED_SYRINGE | SUBCUTANEOUS | Status: AC
  Filled 2023-05-03: qty 1

## 2023-05-04 ENCOUNTER — Encounter: Payer: PPO | Admitting: Adult Health

## 2023-05-04 ENCOUNTER — Encounter: Payer: PPO | Admitting: *Deleted

## 2023-05-04 ENCOUNTER — Encounter: Payer: Self-pay | Admitting: Adult Health

## 2023-05-04 DIAGNOSIS — J849 Interstitial pulmonary disease, unspecified: Secondary | ICD-10-CM

## 2023-05-04 DIAGNOSIS — J84112 Idiopathic pulmonary fibrosis: Secondary | ICD-10-CM

## 2023-05-04 DIAGNOSIS — Z006 Encounter for examination for normal comparison and control in clinical research program: Secondary | ICD-10-CM

## 2023-05-04 NOTE — Progress Notes (Signed)
@Patient  ID: Kenneth [name], male    DOB: [birth year], 82 y.o.   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  Protocol # RIN-PF-301; IND # [id number]; Clinical Trials.gov Identifier: [id number]  Sponsor: [employer] [place], [place] [address]  Key Features of the Drug: Treprostinil (LRX-15, 15AU81, UT-15) is a stable tricyclic benzindene analogue of the naturally occurring prostacyclin (PGI2; epoprostenol), a member of the eicosanoid family of autocoids; these compounds occur widely in tissues and have important pharmacological properties with potent activity, especially on the cardiovascular system and smooth muscle Kenneth [name] 1996). An inhalation solution of treprostinil, 0.6 mg/mL, delivered using an ultrasonic nebulizer, is currently FDA approved to treat pulmonary arterial hypertension (PAH); [WHO Group 1, PH-ILD; WHO Group 3. Inhaled treprostinil is NOW being evaluated as primary treatment of IPF, idiopathic pulmonary fibrosis.   Key Inclusion Criteria: >=31 years of age Diagnosis of IPF based on the 2018 ATS/ERS/JRS/ALAT Clinical Practice Guideline (FVC >=45% predicted at Screening).  Subjects on pirfenidone or nintedanib must be on a stable and optimized dose for >=30 days prior to Baseline. Concomitant use of both pirfenidone and nintedanib is not permitted. Males with a  partner of childbearing potential must use a condom for the duration of treatment and for at least 48 hours after discontinuing study drug.   Key Exclusion Criteria: Subject is pregnant or lactating.  FEV1/FVC <0.70 at Screening.  Prior intolerance or significant lack of efficacy to a prostacyclin or prostacyclin analogue that resulted in discontinuation or inability to effectively titrate that therapy.  The subject has received any PAH-approved therapy, including prostacyclin therapy (epoprostenol, treprostinil, iloprost, or beraprost; except for acute vasoreactivity testing), IP receptor agonists (selexipag), endothelin receptor antagonists, phosphodiesterase type 5 inhibitors (PDE5-Is), or soluble guanylate cyclase stimulators within 60 days prior to Baseline. As needed use of a PDE5-I for erectile dysfunction is permitted, provided no doses are taken within 48 hours of any study-related efficacy assessments.  Use of any of the following medications:  `     a. Azathioprine (AZA), cyclosporine, mycophenolate mofetil, tacroliumus, oral           corticosteroids (OCS) >20 mg/day or the combination of            OCS+AZA+N-acetylcysteine within 30 days prior to Baseline.        b. Cyclophosphamide within 60 days prior to Baseline        c. Rituximab within 6 months prior to Baseline   Receiving >10 L/min of oxygen at rest at Baseline.   Exacerbation of IPF or active pulmonary or upper respiratory infection within 30 days prior to Baseline. Subjects must have completed any antibiotic or steroid regimens for treatment of the infection or acute exacerbation more than 30 days prior to Baseline to be eligible. If hospitalized for an acute exacerbation of IPF or a pulmonary or upper respiratory infection, subjects must have been discharged more than 90 days prior to Baseline to be  Treprostinil formulation Orenitram vs Fluconazole (CYP2C9 inhibitor), to evaluate the effect of inhibiting the CYP2C9 metabolic pathway on on the pK of Treprostinil, had no notable effect. Confirmed CYP2C9 plays a minor role in treprostinil metabolism.  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 No serious adverse reactions met criteria for inclusion in inhaled treprostinil solution clinical studies for interstitial lung disease. System/Organ Class: Nervous System disorders - Syncope in 3 of 115 subjects exposed (2.6%)      Serious Adverse Reactions Observed Postmarketing: Gastrointestinal disorders: vomiting, nausea, diarrhea, GI Hemorrhage (reflects an aggregate of the following: GI hemorrhage, rectal hemorrhage, blood in stool [hematochezia or melena], and hematemesis). General disorders and administration site conditions: Pain Infections and infestations: Pneumonia Nervous system disorders: Syncope, dizziness, headache Respiratory, thoracic and mediastinal disorders: Hemoptysis, cough, epistaxis Vascular disorders: Hypotension, hemorrhage (unspecified blood loss ranging from nondescript to heavy or excessive bleeding).  Safety Data:  As of 20 January 2021, approximately 955 subjects have received treprostinil by inhalation across various studies, ranging from acute studies in healthy volunteers to long-term treatment in subjects with PAH and PH-ILD.   The estimate of worldwide exposure to treprostinil in the marketed inhaled formulations is approximately 40,981,191 total patient treatment-days (approximately  34,318 patient treatment-years). The most common adverse effects of inhaled treprostinil >=10% were cough, diarrhea, dizziness, flushing, headache, muscle/jaw/bone pain, nausea, pharyngolaryngeal pain, oropharyngeal pain, syncope, and throat irritation. Other adverse reactions in the observational study comparing subjects taking inhaled treprostinil and a control group were cough, hemoptysis, nasal discomfort, and throat irritation. Angioedema is a rare but serious reaction that has been seen in the post-marketing setting with treprostinil.  Stability:  Ampoules of drug product are stable until the date indicated when stored in the unopened foil pouch. The [place] commercial labeling states the following regarding storage conditions: Store at 20C to 25C (78F to 82F), with excursions permitted to 15C to 30C (68F to 52F).  Once the foil pack is opened, ampoules should be used within 7 days. Because Tyvaso is light-sensitive, unopened ampoules should be stored in the foil pouch.  05/04/2023 Research visit : Baseline Visit  This visit for [name] with DOB: 1941-04-13 on 05/04/2023 for the above protocol is Visit/Encounter # Baseline  and is for purpose of research . Subject/LAR expressed continued interest and consent in continuing as a study subject. Subject thanked for participation in research and contribution to science. Inclusion and Exclusion reviewed in detail. Subject meets study criteria. Discussed with PI Dr. Marchelle Gearing .     No Known Allergies  Immunization History  Administered Date(s) Administered   Fluad Quad(high Dose 65+) 03/18/2020   Influenza Split 10/03/2009, 03/28/2010, 04/14/2011, 03/22/2012, 03/09/2013, 03/28/2014   Influenza, High Dose Seasonal PF 03/19/2016, 03/06/2017, 04/09/2018, 03/07/2019, 04/06/2020, 04/11/2022   Influenza, Quadrivalent, Recombinant, Inj, Pf 03/12/2018, 03/10/2019, 04/06/2020   Influenza,inj,Quad PF,6+ Mos 04/04/2014, 03/26/2015, 04/05/2016    Influenza-Unspecified 04/05/2016, 04/05/2021   PFIZER Comirnaty(Gray Top)Covid-19 Tri-Sucrose Vaccine 10/15/2020   PFIZER(Purple Top)SARS-COV-2 Vaccination 07/20/2019, 08/10/2019, 02/27/2020, 03/23/2021   Pfizer Covid-19 Vaccine Bivalent Booster 5y-11y 10/05/2022   Pneumococcal Conjugate-13 03/30/2013, 07/31/2013, 04/26/2018   Pneumococcal Polysaccharide-23 10/03/2009, 01/14/2010, 03/31/2018, 02/18/2021   Pneumococcal-Unspecified 04/05/2016   Td 10/20/2016   Tdap 10/03/2009   Zoster Recombinant(Shingrix) 01/03/2017   Zoster, Live 10/03/2009    Past Medical History:  Diagnosis Date   Arthritis    Depression    Diffuse large B cell lymphoma (HCC) 2023  @Patient  ID: Kenneth [name], male    DOB: [birth year], 82 y.o.   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  Protocol # RIN-PF-301; IND # [id number]; Clinical Trials.gov Identifier: [id number]  Sponsor: [employer] [place], [place] [address]  Key Features of the Drug: Treprostinil (LRX-15, 15AU81, UT-15) is a stable tricyclic benzindene analogue of the naturally occurring prostacyclin (PGI2; epoprostenol), a member of the eicosanoid family of autocoids; these compounds occur widely in tissues and have important pharmacological properties with potent activity, especially on the cardiovascular system and smooth muscle Kenneth [name] 1996). An inhalation solution of treprostinil, 0.6 mg/mL, delivered using an ultrasonic nebulizer, is currently FDA approved to treat pulmonary arterial hypertension (PAH); [WHO Group 1, PH-ILD; WHO Group 3. Inhaled treprostinil is NOW being evaluated as primary treatment of IPF, idiopathic pulmonary fibrosis.   Key Inclusion Criteria: >=31 years of age Diagnosis of IPF based on the 2018 ATS/ERS/JRS/ALAT Clinical Practice Guideline (FVC >=45% predicted at Screening).  Subjects on pirfenidone or nintedanib must be on a stable and optimized dose for >=30 days prior to Baseline. Concomitant use of both pirfenidone and nintedanib is not permitted. Males with a  partner of childbearing potential must use a condom for the duration of treatment and for at least 48 hours after discontinuing study drug.   Key Exclusion Criteria: Subject is pregnant or lactating.  FEV1/FVC <0.70 at Screening.  Prior intolerance or significant lack of efficacy to a prostacyclin or prostacyclin analogue that resulted in discontinuation or inability to effectively titrate that therapy.  The subject has received any PAH-approved therapy, including prostacyclin therapy (epoprostenol, treprostinil, iloprost, or beraprost; except for acute vasoreactivity testing), IP receptor agonists (selexipag), endothelin receptor antagonists, phosphodiesterase type 5 inhibitors (PDE5-Is), or soluble guanylate cyclase stimulators within 60 days prior to Baseline. As needed use of a PDE5-I for erectile dysfunction is permitted, provided no doses are taken within 48 hours of any study-related efficacy assessments.  Use of any of the following medications:  `     a. Azathioprine (AZA), cyclosporine, mycophenolate mofetil, tacroliumus, oral           corticosteroids (OCS) >20 mg/day or the combination of            OCS+AZA+N-acetylcysteine within 30 days prior to Baseline.        b. Cyclophosphamide within 60 days prior to Baseline        c. Rituximab within 6 months prior to Baseline   Receiving >10 L/min of oxygen at rest at Baseline.   Exacerbation of IPF or active pulmonary or upper respiratory infection within 30 days prior to Baseline. Subjects must have completed any antibiotic or steroid regimens for treatment of the infection or acute exacerbation more than 30 days prior to Baseline to be eligible. If hospitalized for an acute exacerbation of IPF or a pulmonary or upper respiratory infection, subjects must have been discharged more than 90 days prior to Baseline to be  Treprostinil formulation Orenitram vs Fluconazole (CYP2C9 inhibitor), to evaluate the effect of inhibiting the CYP2C9 metabolic pathway on on the pK of Treprostinil, had no notable effect. Confirmed CYP2C9 plays a minor role in treprostinil metabolism.  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 No serious adverse reactions met criteria for inclusion in inhaled treprostinil solution clinical studies for interstitial lung disease. System/Organ Class: Nervous System disorders - Syncope in 3 of 115 subjects exposed (2.6%)      Serious Adverse Reactions Observed Postmarketing: Gastrointestinal disorders: vomiting, nausea, diarrhea, GI Hemorrhage (reflects an aggregate of the following: GI hemorrhage, rectal hemorrhage, blood in stool [hematochezia or melena], and hematemesis). General disorders and administration site conditions: Pain Infections and infestations: Pneumonia Nervous system disorders: Syncope, dizziness, headache Respiratory, thoracic and mediastinal disorders: Hemoptysis, cough, epistaxis Vascular disorders: Hypotension, hemorrhage (unspecified blood loss ranging from nondescript to heavy or excessive bleeding).  Safety Data:  As of 20 January 2021, approximately 955 subjects have received treprostinil by inhalation across various studies, ranging from acute studies in healthy volunteers to long-term treatment in subjects with PAH and PH-ILD.   The estimate of worldwide exposure to treprostinil in the marketed inhaled formulations is approximately 40,981,191 total patient treatment-days (approximately  34,318 patient treatment-years). The most common adverse effects of inhaled treprostinil >=10% were cough, diarrhea, dizziness, flushing, headache, muscle/jaw/bone pain, nausea, pharyngolaryngeal pain, oropharyngeal pain, syncope, and throat irritation. Other adverse reactions in the observational study comparing subjects taking inhaled treprostinil and a control group were cough, hemoptysis, nasal discomfort, and throat irritation. Angioedema is a rare but serious reaction that has been seen in the post-marketing setting with treprostinil.  Stability:  Ampoules of drug product are stable until the date indicated when stored in the unopened foil pouch. The [place] commercial labeling states the following regarding storage conditions: Store at 20C to 25C (78F to 82F), with excursions permitted to 15C to 30C (68F to 52F).  Once the foil pack is opened, ampoules should be used within 7 days. Because Tyvaso is light-sensitive, unopened ampoules should be stored in the foil pouch.  05/04/2023 Research visit : Baseline Visit  This visit for [name] with DOB: 1941-04-13 on 05/04/2023 for the above protocol is Visit/Encounter # Baseline  and is for purpose of research . Subject/LAR expressed continued interest and consent in continuing as a study subject. Subject thanked for participation in research and contribution to science. Inclusion and Exclusion reviewed in detail. Subject meets study criteria. Discussed with PI Dr. Marchelle Gearing .     No Known Allergies  Immunization History  Administered Date(s) Administered   Fluad Quad(high Dose 65+) 03/18/2020   Influenza Split 10/03/2009, 03/28/2010, 04/14/2011, 03/22/2012, 03/09/2013, 03/28/2014   Influenza, High Dose Seasonal PF 03/19/2016, 03/06/2017, 04/09/2018, 03/07/2019, 04/06/2020, 04/11/2022   Influenza, Quadrivalent, Recombinant, Inj, Pf 03/12/2018, 03/10/2019, 04/06/2020   Influenza,inj,Quad PF,6+ Mos 04/04/2014, 03/26/2015, 04/05/2016    Influenza-Unspecified 04/05/2016, 04/05/2021   PFIZER Comirnaty(Gray Top)Covid-19 Tri-Sucrose Vaccine 10/15/2020   PFIZER(Purple Top)SARS-COV-2 Vaccination 07/20/2019, 08/10/2019, 02/27/2020, 03/23/2021   Pfizer Covid-19 Vaccine Bivalent Booster 5y-11y 10/05/2022   Pneumococcal Conjugate-13 03/30/2013, 07/31/2013, 04/26/2018   Pneumococcal Polysaccharide-23 10/03/2009, 01/14/2010, 03/31/2018, 02/18/2021   Pneumococcal-Unspecified 04/05/2016   Td 10/20/2016   Tdap 10/03/2009   Zoster Recombinant(Shingrix) 01/03/2017   Zoster, Live 10/03/2009    Past Medical History:  Diagnosis Date   Arthritis    Depression    Diffuse large B cell lymphoma (HCC) 2023  Treprostinil formulation Orenitram vs Fluconazole (CYP2C9 inhibitor), to evaluate the effect of inhibiting the CYP2C9 metabolic pathway on on the pK of Treprostinil, had no notable effect. Confirmed CYP2C9 plays a minor role in treprostinil metabolism.  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 No serious adverse reactions met criteria for inclusion in inhaled treprostinil solution clinical studies for interstitial lung disease. System/Organ Class: Nervous System disorders - Syncope in 3 of 115 subjects exposed (2.6%)      Serious Adverse Reactions Observed Postmarketing: Gastrointestinal disorders: vomiting, nausea, diarrhea, GI Hemorrhage (reflects an aggregate of the following: GI hemorrhage, rectal hemorrhage, blood in stool [hematochezia or melena], and hematemesis). General disorders and administration site conditions: Pain Infections and infestations: Pneumonia Nervous system disorders: Syncope, dizziness, headache Respiratory, thoracic and mediastinal disorders: Hemoptysis, cough, epistaxis Vascular disorders: Hypotension, hemorrhage (unspecified blood loss ranging from nondescript to heavy or excessive bleeding).  Safety Data:  As of 20 January 2021, approximately 955 subjects have received treprostinil by inhalation across various studies, ranging from acute studies in healthy volunteers to long-term treatment in subjects with PAH and PH-ILD.   The estimate of worldwide exposure to treprostinil in the marketed inhaled formulations is approximately 40,981,191 total patient treatment-days (approximately  34,318 patient treatment-years). The most common adverse effects of inhaled treprostinil >=10% were cough, diarrhea, dizziness, flushing, headache, muscle/jaw/bone pain, nausea, pharyngolaryngeal pain, oropharyngeal pain, syncope, and throat irritation. Other adverse reactions in the observational study comparing subjects taking inhaled treprostinil and a control group were cough, hemoptysis, nasal discomfort, and throat irritation. Angioedema is a rare but serious reaction that has been seen in the post-marketing setting with treprostinil.  Stability:  Ampoules of drug product are stable until the date indicated when stored in the unopened foil pouch. The [place] commercial labeling states the following regarding storage conditions: Store at 20C to 25C (78F to 82F), with excursions permitted to 15C to 30C (68F to 52F).  Once the foil pack is opened, ampoules should be used within 7 days. Because Tyvaso is light-sensitive, unopened ampoules should be stored in the foil pouch.  05/04/2023 Research visit : Baseline Visit  This visit for [name] with DOB: 1941-04-13 on 05/04/2023 for the above protocol is Visit/Encounter # Baseline  and is for purpose of research . Subject/LAR expressed continued interest and consent in continuing as a study subject. Subject thanked for participation in research and contribution to science. Inclusion and Exclusion reviewed in detail. Subject meets study criteria. Discussed with PI Dr. Marchelle Gearing .     No Known Allergies  Immunization History  Administered Date(s) Administered   Fluad Quad(high Dose 65+) 03/18/2020   Influenza Split 10/03/2009, 03/28/2010, 04/14/2011, 03/22/2012, 03/09/2013, 03/28/2014   Influenza, High Dose Seasonal PF 03/19/2016, 03/06/2017, 04/09/2018, 03/07/2019, 04/06/2020, 04/11/2022   Influenza, Quadrivalent, Recombinant, Inj, Pf 03/12/2018, 03/10/2019, 04/06/2020   Influenza,inj,Quad PF,6+ Mos 04/04/2014, 03/26/2015, 04/05/2016    Influenza-Unspecified 04/05/2016, 04/05/2021   PFIZER Comirnaty(Gray Top)Covid-19 Tri-Sucrose Vaccine 10/15/2020   PFIZER(Purple Top)SARS-COV-2 Vaccination 07/20/2019, 08/10/2019, 02/27/2020, 03/23/2021   Pfizer Covid-19 Vaccine Bivalent Booster 5y-11y 10/05/2022   Pneumococcal Conjugate-13 03/30/2013, 07/31/2013, 04/26/2018   Pneumococcal Polysaccharide-23 10/03/2009, 01/14/2010, 03/31/2018, 02/18/2021   Pneumococcal-Unspecified 04/05/2016   Td 10/20/2016   Tdap 10/03/2009   Zoster Recombinant(Shingrix) 01/03/2017   Zoster, Live 10/03/2009    Past Medical History:  Diagnosis Date   Arthritis    Depression    Diffuse large B cell lymphoma (HCC) 2023

## 2023-05-04 NOTE — Research (Cosign Needed)
Title:A Randomized, Double-blind, Placebo-controlled, Phase 3 Study of the Efficacy and Safety of Inhaled Treprostinil in Subjects with Idiopathic Pulmonary Fibrosis   Dose and Duration of Treatment:Treprostinil for Inhalation 0.6 mg/mL, or placebo (randomly assigned 1:1) over a 52 week period.  Protocol # RIN-PF-301; IND # [id number]; Clinical Trials.gov Identifier: [id number]  Sponsor: [employer]Research [place], [place] [address]  Key Features of the Drug: Treprostinil (LRX-15, 15AU81, UT-15) is a stable tricyclic benzindene analogue of the naturally occurring prostacyclin (PGI2; epoprostenol), a member of the eicosanoid family of autocoids; these compounds occur widely in tissues and have important pharmacological properties with potent activity, especially on the cardiovascular system and smooth muscle [name] 1996). An inhalation solution of treprostinil, 0.6 mg/mL, delivered using an ultrasonic nebulizer, is currently FDA approved to treat pulmonary arterial hypertension (PAH); [WHO Group 1, PH-ILD; WHO Group 3. Inhaled treprostinil is NOW being evaluated as primary treatment of IPF, idiopathic pulmonary fibrosis.   Key Inclusion Criteria: >=4 years of age Diagnosis of IPF based on the 2018 ATS/ERS/JRS/ALAT Clinical Practice Guideline (FVC >=45% predicted at Screening).  Subjects on pirfenidone or nintedanib must be on a stable and optimized dose for >=30 days prior to Baseline. Concomitant use of both pirfenidone and nintedanib is not permitted. Males with a partner of childbearing potential must use a condom for the duration of treatment and for at least 48 hours after discontinuing study drug.   Key Exclusion Criteria: Subject is pregnant or lactating.  FEV1/FVC <0.70 at Screening.  Prior intolerance or significant lack of efficacy to a prostacyclin or prostacyclin analogue that resulted in discontinuation or inability to effectively titrate that therapy.  The subject  has received any PAH-approved therapy, including prostacyclin therapy (epoprostenol, treprostinil, iloprost, or beraprost; except for acute vasoreactivity testing), IP receptor agonists (selexipag), endothelin receptor antagonists, phosphodiesterase type 5 inhibitors (PDE5-Is), or soluble guanylate cyclase stimulators within 60 days prior to Baseline. As needed use of a PDE5-I for erectile dysfunction is permitted, provided no doses are taken within 48 hours of any study-related efficacy assessments.  Use of any of the following medications:  `     a. Azathioprine (AZA), cyclosporine, mycophenolate mofetil, tacroliumus, oral           corticosteroids (OCS) >20 mg/day or the combination of            OCS+AZA+N-acetylcysteine within 30 days prior to Baseline.        b. Cyclophosphamide within 60 days prior to Baseline        c. Rituximab within 6 months prior to Baseline   Receiving >10 L/min of oxygen at rest at Baseline.   Exacerbation of IPF or active pulmonary or upper respiratory infection within 30 days prior to Baseline. Subjects must have completed any antibiotic or steroid regimens for treatment of the infection or acute exacerbation more than 30 days prior to Baseline to be eligible. If hospitalized for an acute exacerbation of IPF or a pulmonary or upper respiratory infection, subjects must have been discharged more than 90 days prior to Baseline to be eligible.  Uncontrolled cardiac disease, defined as myocardial infarction within 6 months prior to Baseline or unstable angina within 30 days prior to Baseline. Use of any investigational drug/device or participation in any investigational study in which the subject received a medical intervention (ie, procedure, device, medication/supplement) within 30 days prior to Screening. Subjects participating in non-interventional, observational, or registry studies are eligible.  Life expectancy <6 months due to IPF or a concomitant illness.  Acute  pulmonary embolism within 90 days prior to Baseline.   Pharmacodynamics:  (Dose-Response Relationships) - In a clinical study of 241 healthy volunteers, single doses of Tyvaso 54 ?g (the target maintenance dose per session) and 84 ?g (supratherapeutic inhalation dose) prolonged the QTc interval by approximately 10 ms. The QTc effect dissipated rapidly as the concentration of treprostinil decreased.  Pharmacokinetics: (ADME) - Treprostinil is substantially metabolized by the liver, primarily by CYP2C8.  The elimination of treprostinil (following [place] administration of treprostinil) is biphasic, with a terminal elimination t1?2 of approximately 4 hours.    Contraindications:   None  Special Warnings/Considerations: Treprostinil undergoes substantial hepatic metabolism; thus, liver impairment could result in decreased metabolism and increased systemic exposure to treprostinil potentiating the occurrence and/or severity of AEs and/or overdose. There are no adequate and well-controlled studies with Tyvaso in pregnant women. There are risks to the mother and the fetus associated with PAH. Patients who discontinue the use of any effective therapy may be expected to notice a decline in their clinical status. Patients who abruptly discontinue therapy (ie, miss 2 or more doses), or markedly reduce the dose, are at risk for worsening of PAH symptoms or a rebound in [facility]. There is no evidence to suggest or suspect that patients taking inhaled treprostinil would be at risk for withdrawal symptoms or rebound effects.  Treprostinil inhibits platelet aggregation and increases the risk of bleeding, particularly with concomitant anticoagulants and antiplatelet therapies. Treprostinil is a pulmonary and systemic vasodilator. In patients with low systemic arterial pressure, inhaled treprostinil solution may cause symptomatic hypotension, which may present as but is not limited to light-headedness, dizziness, blurred or  faded vision, and syncope. Concomitant administration of treprostinil with diuretics, antihypertensive agents, or other vasodilators that may lower blood pressure increases the risk of symptomatic hypotension. Signs and symptoms of overdose with inhaled treprostinil solution may correspond to dose-limiting pharmacologic effects, including diarrhea, flushing, headache, hypotension, nausea, and vomiting. Most events are self-limiting and resolve with reduction or withholding of inhaled treprostinil solution.  Interactions:              Drug Interaction Studies Conducted with Treprostinil: Study # P01:08: Treprostinil formulation [place] Remodulin vs Acetaminophen, to evaluate effect of acetaminophen on Treprostinil pK, showed no effect. Study # P01:12: Treprostinil formulation  Remodulin vs Warfarin, to evaluate effect of Warfarin on Treprostinil pK/PD, showed no effect. Study TDE-PH-105: Treprostinil formulation Orenitram vs Bosentan, to evaluate effect of Bosentan on Treprostinil pK, showed no effect. Study TDE-PH-106: Treprostinil formulation Orenitram vs Sildenafil, to evaluate effect of Sildenafil on Treprostinil pK, showed no effect. Study TDE-PH-109: Treprostinil formulation Orenitram vs Rifampicin (CYP2C8/9 inducer), to evaluate the effect of inducing the CYP2C8 metabolic pathway on the pK of Treprostinil, confirmed metabolic pathway via CYP2C8/9. Expected interaction resulting in reduced Cmax and AUC. Study TDE-PH-110: Treprostinil formulation Orenitram vs Gemfibrozil (CYP2C8  inhibitor), to evaluate the effect of inhibiting the CYP2C8 metabolic pathway on the pK of Treprostinil, confirmed metabolic pathway primarily via CYP2C8. Expected interaction resulting in significantly increased Cmax and AUC. Study TDE-PH-110: Treprostinil formulation Orenitram vs Fluconazole (CYP2C9 inhibitor), to evaluate the effect of inhibiting the CYP2C9 metabolic pathway on on the pK of Treprostinil, had no notable  effect. Confirmed CYP2C9 plays a minor role in treprostinil metabolism.  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 No serious adverse reactions met criteria for inclusion in inhaled treprostinil solution clinical studies for interstitial lung disease. System/Organ  Class: Nervous System disorders - Syncope in 3 of 115 subjects exposed (2.6%)      Serious Adverse Reactions Observed Postmarketing: Gastrointestinal disorders: vomiting, nausea, diarrhea, GI Hemorrhage (reflects an aggregate of the following: GI hemorrhage, rectal hemorrhage, blood in stool [hematochezia or melena], and hematemesis). General disorders and administration site conditions: Pain Infections and infestations: Pneumonia Nervous system disorders: Syncope, dizziness, headache Respiratory, thoracic and mediastinal disorders: Hemoptysis, cough, epistaxis Vascular disorders: Hypotension, hemorrhage (unspecified blood loss ranging from nondescript to heavy or excessive bleeding).  Safety Data:  As of 20 January 2021, approximately 955 subjects have received treprostinil by inhalation across various studies, ranging from acute studies in healthy volunteers to long-term treatment in subjects with PAH and PH-ILD.   The estimate of worldwide exposure to treprostinil in the marketed inhaled formulations is approximately 41,[phone] total patient treatment-days (approximately 34,318 patient treatment-years). The most common adverse effects of inhaled treprostinil >=10% were cough, diarrhea, dizziness, flushing, headache, muscle/jaw/bone pain, nausea, pharyngolaryngeal pain, oropharyngeal pain, syncope, and throat irritation. Other adverse reactions in the observational study comparing subjects taking inhaled treprostinil and a control group were cough, hemoptysis, nasal discomfort, and throat  irritation. Angioedema is a rare but serious reaction that has been seen in the post-marketing setting with treprostinil.  Stability:  Ampoules of drug product are stable until the date indicated when stored in the unopened foil pouch. The [place] commercial labeling states the following regarding storage conditions: Store at 20C to 25C (73F to 85F), with excursions permitted to 15C to 30C (56F to 15F).  Once the foil pack is opened, ampoules should be used within 7 days. Because Tyvaso is light-sensitive, unopened ampoules should be stored in the foil pouch.    [facility] [place] Clinical Research Coordinator note:   This visit for [name] with DOB: [id number] on 05/04/2023 for the above protocol is Visit/Encounter # Baseline visit  and is for purpose of research.   The consent for this encounter is under Protocol Version : Amendment 4 dated: 26Jul2023 IB: Version 19 dated 08Jul2024 ICF: Ver. 3.0, 05 Feb 2022, WCG Ver. 3.1, 02 Apr 2022, IRB APPROVED AS MODIFIED Apr 07, 2022 Lab Manual: V4 dated 07Jun2023 is currently IRB approved.   Kenneth [name]  (Subject/LAR) expressed continued interest and consent in continuing as a study subject. Subject confirmed that there was  No  (NO/YES) change in contact information (e.g. address, telephone, email). Subject thanked for participation in research and contribution to science. In this visit 05/04/2023 the subject will be evaluated by Sub-Investigator) named Tammy Parrett. This research coordinator has verified that the above investigator is up to date with his/her training logs.   The Subject was informed that the PI Trellis Moment, continues to have oversight of the subject's visits and course through relevant discussions, reviews, and also specifically of this visit by routing of this note to the PI.    1. This visit is a key visit of randomization. The PI is NOT available for this visit.   Because the PI is NOT available, the  Sub-Investigator Rubye Oaks, Nurse practitioner reported and CRC has confirmed that the PI Murali Ramaswamy (HAS) discussed the visit a-priori with the Sub-Investigator.  2.  In addition, ahead of the key visit of randomization,the visit and subject were discussed with the PI Kalman Shan  on date of 23 Apr 2023 face to face) as part of direct PI oversight.  Patient was randomized today and all the assessments were done as per  the protocol. Further details in the binder.    Signed by  Neita Garnet  Clinical Research Coordinator  [facility]  [place], [place] 11:07 AM 05/04/2023

## 2023-05-04 NOTE — Assessment & Plan Note (Signed)
Continue per study protocol 

## 2023-05-04 NOTE — Assessment & Plan Note (Signed)
Continue current maintenance regimen

## 2023-05-13 ENCOUNTER — Ambulatory Visit: Payer: PPO | Admitting: Internal Medicine

## 2023-05-13 ENCOUNTER — Encounter: Payer: Self-pay | Admitting: Internal Medicine

## 2023-05-13 VITALS — BP 108/68 | HR 74 | Ht 68.0 in | Wt 169.0 lb

## 2023-05-13 DIAGNOSIS — J84112 Idiopathic pulmonary fibrosis: Secondary | ICD-10-CM

## 2023-05-13 DIAGNOSIS — Z5181 Encounter for therapeutic drug level monitoring: Secondary | ICD-10-CM

## 2023-05-13 NOTE — Patient Instructions (Addendum)
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HCC) Encounter for therapeutic drug monitoring Nausea due to drug  -  IPF slowly getting worse 2022 -> oct 2023. On 05/13/2023 PFTs seem better and improved to spring 2024 levels   - ? Due to being on study  -curently on research study inhaled tyvaso v placebo on TETOn 301 study  -on esbreit/pirfenidone  Plan  - continue pirfenidone at full dose - monitor pulse ox even with exertion - goal >= 88%  -will reviuew lab data in research    Fatigue - significant  in 2023 and on 01/04/2023 Weight loss - stopped as of April 2024 an and July 2024 Nausea - some 10/13/2022 and 01/04/2023 05/13/2023 - mild    -symptoms are due to esbriet and medical issues  Plan - cotninue esbriet but manage symptoms as outlined before    Followup - 6 months - 30 min visit with Dr Marchelle Gearing   - symptom score and walk test at followup  - can cancel if needed if research data going well

## 2023-05-13 NOTE — Progress Notes (Signed)
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 He tells me that this summer 2018*noticing insidious onset of shortness of breath particularly with walking stairs in the house or [place]. This was not that in the previous years. The summer 2018 he was in [place] but did not notice much shortness of breath. Otherwise he is able to do his activities of daily living and played golf with a car. He is more bothered by his back pain and sciatica as a result of previous back surgery. Shortness of breath is extremely mild. He says he became more aware of it after the radiologic investigations documented below. He had a chest x-ray that suggested interstitial findings and therefore he underwent a high-resolution CT scan of the chest that is described below. I personally visualized this high resolution CT chest and to me it shows bilateral bibasal subpleural reticulation that also extends to the upper lobes without any zonal predominance. There is no obvious honeycombing but there is traction bronchiectasis. There no mediastinal adenopathy. Therefore he is been referred here. In terms of exposure history other than radiation exposure he has not been on any pulmonary toxic drugs or any mold exposure or asbestos exposure. Does have GERD and is on PPI and is well ocntrolled   Does have spring and perennial allergies - sneezes easy. Allergy test with [facility] negative some years ago  [employer] chest physicians interstitial lung disease questionnaire: He says that he does not cough. He is only trouble with dyspnea with strenuous exercise and it started 4 months ago. Past medical history significant for acid reflux. He has never smoked any tobacco or street drugs. Does not have any family history of lung disease. At  home he does not have any humidifier sound birds heart the water damage or mold. 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 He has standard radiation exposure while at work. The standard radiation for a radiologist. He does not take any pulmonary toxic drugs. House is 81 years old and he worries about mold. He sneezes if air is forced   Pulmonary function test shows isolated reduction in diffusion capacity to 62%. Walking desaturation test underneath her feet 3 laps on room air: Resting pulse ox 96%. Final pulse ox 95%. Heart rate resting was 92/m and went up to 109 minute.   Results for TRISTIN, VANDEUSEN (MRN [id number]) as of 03/23/2017 10:04  Ref. Range 03/04/2017 09:13  FVC-Pre Latest Units: L 3.59  FVC-%Pred-Pre Latest Units: % 86  Results for JOCK, MAHON (MRN [id number]) as of 03/23/2017 10:04  Ref. Range 03/04/2017 09:13  DLCO cor Latest Units: ml/min/mmHg 20.06  DLCO cor % pred Latest Units: % 63    Lungs/Pleura: There is subpleural reticulation with scattered ground-glass and traction bronchiolectasis, without a definite zonal predominance. No definitive honeycombing. Findings appear mildly progressive when radiographs dating back to 12/28/2008 are reviewed. Probable 5 mm subpleural lymph node along the right major fissure. No pleural fluid. Airway is unremarkable.      IMPRESSION: 1. Pulmonary parenchymal findings of interstitial lung disease which may be due to fibrotic nonspecific interstitial pneumonitis or, given slight progression over time, usual interstitial pneumonitis. 2. Aortic atherosclerosis (ICD10-170.0). Coronary artery calcification. 3. Aortic aneurysm NOS (ICD10-I71.9).  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 Recommend annual imaging followup by CTA or MRA. This recommendation follows 2010 ACCF/AHA/AATS/ACR/ASA/SCA/SCAI/SIR/STS/SVM Guidelines for the Diagnosis and Management of Patients with Thoracic  Aortic Disease. Circulation. 2010; 121: J191-Y782. 4. Prominence of the right and left main pulmonary arteries can be seen with pulmonary arterial hypertension.     Electronically Signed   By: Leanna Battles M.D.   On: 02/17/2017 13:41      OV 03/23/2017  Chief Complaint  Patient presents with   Follow-up    SOB on exertion. Other than that he has been doing about the same. Denies any cough or CP.   Here to discuss results. Wife [name] here. 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 In inteirm no new symptoms.  Autoimmune profile negative. They raise possibility of 2nd opinion.  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 Progression is based on CXR comparison since 2010; there is no CT and is only suspicion of progression OV 05/06/2017  Chief Complaint  Patient presents with   Follow-up    Follow up today after beginning Esbriet. Pt has no c/o SOB, cough, or CP. Has had some mild nausea from the Esbriet but other than that, he has been doing good on it.   ollow-up idiopathic pulmonary fibrosis mild severity  He is here to follow-up He is on the third week of his bed at 3 pills 3 times a day max dose. So far is tolerating it fine other than mild nausea. He does take after meals. He plans to meet with [facility] coordinator for medication support. He did visit [place] this past weekend and did have vomiting especially after consuming few etoh drinks at a [employer]. He does apply sunscreen [his daughter is a Armed forces operational officer in town]. He still complains about the fact that and is 81 year old home when the air conditioner comes on and they air blows out of the duct system he does sneeze 10 times and his feet does feel cold. She plans to put hepa filters that are high and and also get the duct cleaning. He says he did have allergy evaluation and found few years ago and it was negative. Recently he has  increased his rpinrole and alsogot an injection for his back and he does feel better   OV 06/22/2017  Chief Complaint  Patient presents with   Follow-up    Pt still taking Esbriet and has been doing good except has been having issues with nausea x3 days. PFT done today. Still becomes SOB with exertion. Denies any complaints of cough or CP.   IPF followup  Routine followup. Now on esbriet 3 pills tid since end oct 2018. Having new nausea - moderate, x 3 days. Might be chills. But no associated diarrhea or other side effects. Spacing esbriet 4h only. Also has complaitns about high co pay. Unable to find charity. Doing exrcise with trainer - feeels that is better and more vigorous than rehab. Gets tachy with eercise but says he does not desaturate. Had PFT - stable. Lot of questions about disease    Walking desaturation test on 06/22/2017 185 feet x 3 laps on ROOM AIR:  did NOT desaturate. Rest pulse ox was 100%, final pulse ox was 99%. HR response 84/min at rest to 97/min at peak exertion. Patient DOM HAVERLAND  Did not Desaturate < 88% . Kenneth [name] did not  Desaturated </= 3% points. Ronaldo Miyamoto A [place] yes did get tachyardic   OV 09/20/2017  Chief Complaint  Patient presents with   Follow-up    PFT done today.  Pt states he has been doing good. Pt still taking Esbriet and states he is doing good on it.   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  Collier Flowers for IPF follow-up.  Last visit December 2018.  Since then he has been compliant fully with his Pirfenidone (Esbriet).  His nausea resolved with Ginger intake.  However he is having 3-5 pound weight loss and also fatigue towards the end of the day and also low appetite.  He is exercising vigorously 5 times a week and he is wondering if the fatigue could be because of the heavy exercise.  He is not interested in lung transplantation.  He is open to research participation in interventional trial.  Currently he is on the IPF registry program.  Overall he is  well.  He did go to [place] to visit his son and he did not wear sunscreen and he did pick up a stage I skin burn with erythema that was more than usual.  But this resolved.  I did remind him about his obligation to wear sunscreen at all times with Pirfenidone (Esbriet).  He is interested in interventional research trials.  His pulmonary function test to me on an average is stable even though there is decline in FVC that seems to be an increase in DLCO so that is variability.  His walking desaturation test is around the same. He is interested in rolling over to the larger capsule of the Pirfenidone (Esbriet) so we will have to take it only 3 times a day. ESberit is cposting him $500/month or more due to high AGI and this is frustrating for him   Walking desaturation test on 09/20/2017 185 feet x 3 laps on ROOM AIR:  did walk briskly all 3 laps with only mild dyspnea desaturate. Rest pulse ox was 98%, final pulse ox was 98%. HR response 96/min at rest to 107/min at peak exertion. Patient JOHNWILLIAM SHEPPERSON  Did not Desaturate < 88% . Kenneth [name] did not  Desaturated </= 3% points. Martin Majestic Jutte yes did get tachyardic this appears similar to a few months ago   OV 12/07/2017  Chief Complaint  Patient presents with   Follow-up    Pt had pre Cleda Daub and DLCO prior to OV today. Pt has increase of SOB with exertion more with walking up the hill.   Dr. [name] presents for follow-up of his IPF to ILD clinic.  In terms of his IPF he feels stable.  In fact he tells me that if not for the incidental chest x-ray and a subsequent CT scan that picked up pulmonary fibrosis he even to this day will not know that he has IPF.  He only has a mild exertional dyspnea class I which she always believes in the past that it was because of aging.  At this point in time he is on Esbriet 1 big pill 3 times daily at full dose.  He is now applying sunscreen and has not had any further sunburns.  He is following an extremely intense exercise  fitness regimen to improve his cardiac conditioning.  He believes his exertional heart rate and resting heart rate are much improved than before.  He works with a Psychologist, educational at [employer].  His main issue is from Pirfenidone [employer]).  He had some mild nausea  that has now resolved with ginger capsules but he continues to have some amount of fatigue low appetite and some mild weight loss.  He says this is mild and all tolerable.  The fatigue happens after working a lot in the yard and he loves yard work.  He wants to know if he can continue to do yard work.  Recently he did have some sinus congestion and took some steroids and antibiotics for it and he feels better but he still has some sinus fullness.  He had spirometry and simple walking desaturation test and the profile is below and I believe these are stable.  In fact his exertional heart rate is improved compared to previous.  Today he also participated in the IPF registry program.  There is a noninterventional trial.  He is interested in future interventional trials.   OV 03/08/2018  Chief Complaint  Patient presents with   Follow-up    PFT performed today. Pt states he has been doing well except he has been having problems with nausea and dizziness, and loss of appetite.   Kenneth [name] - presents for follow-up. For his IPF. He is on esbruet now. He was tolerating his Pirfenidone (Esbriet) quite well other than minor side effects till last visit. However in thesummer of 2019e's had increased nauseaand also weight loss. He has lost 6 pounds since prior visit. Overall he is lost 11 pounds since September 2018. We started the medication esbeit  anti-fibrotic for him in October 2018. His pants are getting looser.he is also having significant fatigue. Nevertheless he does not want to switch to the of anti-fibrotic ofev because of prior history of irritable bowel syndrome. He says that his baseline irritable bowel syndrome is worse than any side effects  he is having with esbriet especially the fatigue.e is open to a transplant evaluation and consideration of research protocols. His spirometry shows stability with FVC but his DLCO might be declining.Marland Kitchen His simple walk test did not show anychange. He is not noticing any subjective changes in dyspnea on effort tolerance while working out although he does admit to increased dizziness particularly when playing golf. During this time recently has not monitored his pulse ox but in the past and never went below 92%.  Other issues: He had some myalgia despite change in his statin. He will address this with his primary care. 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 Primary care has ordered a follow-up CT scan.This can be combined with a high risk CT scan. I I have reached out to  the thoracic radiology     OV 05/10/2018  Subjective:  Patient ID: Kenneth [name], male , DOB: 28-May-1941 , age 66 y.o. , MRN: [id number] , ADDRESS: [address] [place] [address]   05/10/2018 -   Chief Complaint  Patient presents with   Follow-up    review spiro with dlco.  c/o stable doe.  On 801mg  Esbriet TID, notes occ dizziness, loss of appetite.      HPI ERYX ZANE 82 y.o. -returns-year follow-up.  Diagnosis made towards the end of 2018.  He has been on Esbriet since then.  His main issues have been weight loss.  He lost 10 pounds of weight at the time of last visit.  At the last visit he weighed 174 pounds in September 2019.  Back in August 2018 he weighed 186 pounds.  All this weight loss happen after he started Pirfenidone (Esbriet).  However he  is now stabilized and he continues to be 874 pounds.  He is able to do activities of daily living and exercise but when he does stairs he gets dyspneic.  Overall he feels stable in the last 1 year in terms of his effort tolerance.  His main thing is that he has no appetite.  He has been communicating with the Pirfenidone (Esbriet) supporting about how to  manage his low appetite and occasional nausea.  We discussed the options about taking brief holidays or reduction in dose.  He is willing to try this.  He does take occasional ginger.  Of note he had a transplant referral set up by myself or [facility] but as our injection letter because he was out of network.  He tells me that his insurance company only advised that he would have a higher co-pay so is a little bit puzzled by this.  Since then he said conversation with his primary care physician and he wants to see Dr. Hessie Dibble who is a son-in-law to his friend Dr. Marcy Panning retired Careers adviser.  I know Dr. Glynda Jaeger quite well from an IPF standpoint.  And therefore wholeheartedly supported the idea.  In terms of his lung function testing his FVC shows a decline although he is feeling the same.  On miniature walking test he seems a little more tachycardic but he says he feels the same.  He manages this heart rate with his exercise.  His DLCO with some variability appears stable.  He had a CT angiogram for thoracic aortic aneurysm in September 2019 and this when compared to one year earlier has been reported as stable but the different resolutions.  I offered to do a high-resolution CT chest here but he wants to hold off until he saw Dr. Glynda Jaeger.  OV 08/23/2018  Subjective:  Patient ID: Kenneth [name], male , DOB: Jul 18, 1940 , age 42 y.o. , MRN: [id number] , ADDRESS: [address] [place] [address]   08/23/2018 -   Chief Complaint  Patient presents with   Follow-up    PFT performed today. Pt states he has been doing well since last visit and denies any complaints.   IPF followup"   Diagnosis made towards the end of 2018.  He has been on Esbriet since then.   HPI SYLVESTER MINTON 82 y.o. -Dr. Threasa Beards presents for follow-up of his IPF.  He continues to feel good.  His main issue is tolerating the Esbriet which gives some fatigue.  Sometimes he has to postpone it.  In terms of his dyspnea he  continues to exercise.  His dyspnea symptom score is really minimal.  His cough is nonexistent.  He was losing weight with Esbriet but now he is gained his weight back.  In the interim he did see Dr. Hessie Dibble June 23, 2018 at [facility].  He had a satisfactory visit.  He thought he was stable.  His next visit with Dr. Glynda Jaeger will be in June 2020.  Moving forward he wants to alternate every 6 months between myself and Dr. Glynda Jaeger at [facility] which will give him 3 months with 1 pulmonologist or the other.  He is beginning to get interested in research studies.  We did pulmonary function test on him today and is documented below.  The FVC appears a fluctuant but the DLCO definitely appears on a downward trend.  The new DLCO is on a global lung initiated [GL I] equation and therefore the percent predicted  is higher.  But the absolute value showed downward trend line.  We went over this.  He definitely does not feel this subjectively.  His last high-resolution CT scan of the chest was in August of 2018.  He is willing to have one at follow-up.  He wants to see me again in 6 months.  His simple walking desaturation test appears stable.  He is thinking of making a trip to [place] and wants to know if he should wear a mask.    OV 04/21/2019  Subjective:  Patient ID: Kenneth [name], male , DOB: [birth year] , age 16 y.o. , MRN: [id number] , ADDRESS: [address] [place] [address]   04/21/2019 -   Chief Complaint  Patient presents with   Follow-up    Patient reports that his breathing is doing well at this time.    IPF followup. On esbriet  HPI AUNDREY ELAHI 82 y.o. -presents for follow-up of his idiopathic pulmonary fibrosis.  Last visit was pre-pandemic in February 2018.  In the interim he did see Dr. Hessie Dibble at [facility] and deemed to be stable.  At this point in time he is reporting continued stability with the symptoms as seen by the score below.  However I did notice  that he is lost lot of weight.  His BMI with his clothes on is 25 with a weight of 170 pounds.  He says his dry weight is actually around 162 pounds.  He feels that this is the stable weight in the last 6 months.  He says he is not bothered by it.  He says this is because of low appetite because of the and pirfenidone.  He feels the pirfenidone is benefiting him and he continues to be is to be stable.  In fact his pulmonary function test shows " improvement".  He said that he felt the test today was somewhat variable and is effort based on the technician.  He had a recent high-resolution CT chest that shows continued stability.  I personally visualized this film.  At this point in time he is happy taking the pirfenidone.  But he did agree if there is further weight loss into the 150s pounds then we could reassess  Had a lot of discussion about COVID-19 and about appropriate risk reduction measures which he is continuing to do.  He did some read some literature about COVID-19 and had questions about it. He in Vit D supplementation and is agreeable to get his levels checked following literature about protective effect of Vit D  Other than that he reports 2 episodes of dizziness and in one of those he nearly passed out.  He was wondering if some of the symptoms were related to ropinirole and therefore he has stopped it.  I reviewed the literature with him and the significant amount of dizziness and hypotension.  And also GI symptoms which can probably accentuated with pirfenidone.   OV 10/16/2019  Subjective:  Patient ID: Kenneth [name], male , DOB: 09-23-1940 , age 69 y.o. , MRN: [id number] , ADDRESS: [address] [place] [address]   10/16/2019 -   Chief Complaint  Patient presents with   Follow-up    PFT performed 3/31.  Pt states he is doing okay. States his breathing is about the same since last visit.     HPI YOUCEF KLAS 82 y.o.  -follow-up IPF on pirfenidone    -ROS   69-month  follow-up for  Dr. [name].  He continues on pirfenidone.  He is taking full dose.  In the interim he has had hernia surgery 6 weeks ago for right inguinal hernia.  This is gone well.  He is change his exercise from bicycle to walking because of the hernia.  His symptom score is the same.  In January 2021 he saw Dr. Glynda Jaeger at [facility] for IPF follow-up.  Deemed to be stable.  I reviewed that note.  After that he has had pulmonary function test with [place] couple weeks ago.  This shows a decline.  He says the quality of instructions was poor and therefore the decline is because of that.  At least that is his suspicion.  He feels well.  Walking desaturation test is stable.  He has gained some weight.  He continues to have some nausea and fatigue and diarrhea with the pirfenidone.  But this is stable and manageable.  He feels his irritable bowel syndrome is active because of the pirfenidone.  He plans to see Dr. Harlene Salts for that.  His last liver function test was with Dr. Glynda Jaeger in January 2021.  This was normal per the note.  His last CT scan was towards the end of 2020 and deemed to be stable.     OV 04/11/2020   Subjective:  Patient ID: Kenneth [name], male , DOB: [birth year], age 46 y.o. years. , MRN: [id number],  ADDRESS: [address] [place] [address] PCP  Rodrigo Ran, MD Providers : Dr Delton See  Treatment Team:  Attending Provider: Kalman Shan, MD   Chief Complaint  Patient presents with   Follow-up    PFT performed today.  Pt states he has been doing well since last visit and denies any complaints.    Follow-up idiopathic pulmonary fibrosis.  Diagnosis towards the end of 2018.  On pirfenidone.  Last CT scan of the chest September 2020.   HPI CADDEN ELIZONDO 81 y.o. -presents for follow-up last seen in April 2021.  After that in July 2021 he visited with Dr. Hessie Dibble at [facility].  I reviewed the notes.  The feeling was that he was stable.  He is  only minimally symptomatic.  He had a 6-minute walk test and did really well without any significant desaturations.  He is now resorted to high intensity interval training based on advice from a physical therapist/gym instructor.  He feels like stronger and failure.  He also states that his appetite is better and is eating better.  He feels his weight is stable.  In terms of his IPF symptoms is minimal and documented below.  He just has mild irritable bowel syndrome with his Esbriet and baseline health.  His walking desaturation test today in the office is stable.  His pulmonary function test was reviewed.  Over the last 3 years it shows a slight steady decline.  This decline is both in FVC and DLCO.  He is averaging around 2.6% decline per year with FVC while 8% decline in the DLCO per year.  On an average with a 3% decline per year in the last 3 years.  We did discuss this.  We discussed #pulmonary fibrosis foundation support group -he will visit the website.  He gets newsletters from them.  At this point in time he is not interested in joining the support group but he did get the contact information for this and will reach out if he desires so.  #-We also discussed  research as a care option -we discussed trial sponsored by [place] the Child psychotherapist of pirfenidone.  The trial is called [employer].  The investigation medical product is called PRM-151 in the axilla macrophage pathway.  Promising phase 2 data.  Research team gave him the consent form under my delegation.  He understands research is voluntary and is to evaluate unproven therapies.  He understands that the primary intent is to contribute to her scientific development and therapeutic gain is secondary because of an approved product the risks benefit ratio is very different compared to blood products.  He is going to read the consent and think about potential participation.  I sent him via email medical journal articles about the investigational  product    He tells me that he feels his weight is stable.  His dry weight early in the morning is between 162-163 pounds.  I pointed out that his weight today is 168 pounds and is lower than before.  However he says that the difference rating 168-172 is related to clothing and his weight at home is stable.  He is not concerned about the slight weight loss.  Overall while he agrees he is lost weight since starting Esbriet he feels recently has been stable.  He wants to continue to monitor the situation  Irritable bowel: He said he will check with Dr. Marina Goodell about this  Other issues:  - He wants me to check his iron panel -He will do follow-up of his IPF-registry visit today  -for research-  -He has had his Covid booster    OV 10/22/2020  Subjective:  Patient ID: Kenneth [name], male , DOB: 12/16/1940 , age 21 y.o. , MRN: [id number] , ADDRESS: [address] [place] [address] PCP Rodrigo Ran, MD Patient Care Team: Rodrigo Ran, MD as PCP - General (Internal Medicine) Felicita Gage, RN as Oncology Nurse Navigator  This Provider for this visit: Treatment Team:  Attending Provider: Kalman Shan, MD    10/22/2020 -   Chief Complaint  Patient presents with   Follow-up    Fatigue from radiation treatments, more winded and more GI upset from radiation treatments    Follow-up idiopathic pulmonary fibrosis.  Diagnosis towards the end of 2018.  On pirfenidone.  Last CT scan of the chest September 2020.    HPI TENNYSON WACHA 82 y.o. -returns for follow-up.  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  He is finished hormonal treatment and now is on radiation treatment.  Radiation treatment is aggravating his GI side effects from Pirfenidone (Esbriet) and from irritable bowel syndrome.  Is also causing fatigue.  He went August a masters in walk the tournament.  This made him more fatigued than what he experienced few years ago.  He said it took him a few days to recover.  He  used to have weight loss but this is now stabilized it is documented below.  His symptom severity score and his walking desaturation test are stable but his pulmonary function test continues to show decline.  His DLCO shows a relatively more decline but he said there were technique issues today.  He is got upcoming appointment with ILD clinic at [facility] in July.  At this point in time he is interested in phase 3 clinical trials.  We have quite a few to off as of summer 2022 but his prostate cancer could be an exclusion.  We will have the research team evaluate.  He is saddened by the  loss of his friend Dr. Samuella Cota.  He is evaluating whether he should join the pulmonary fibrosis foundation at the national level to be a board member.     OV 04/03/2021  Subjective:  Patient ID: Kenneth [name], male , DOB: Aug 16, 1940 , age 33 y.o. , MRN: [id number] , ADDRESS: [address] [place] [address] PCP Rodrigo Ran, MD Patient Care Team: Rodrigo Ran, MD as PCP - General (Internal Medicine) Felicita Gage, RN as Oncology Nurse Navigator  This Provider for this visit: Treatment Team:  Attending Provider: Kalman Shan, MD    04/03/2021 -   Chief Complaint  Patient presents with   Follow-up    Just performed a partial PFT, patient states increased SOB.    Follow-up idiopathic pulmonary fibrosis.  Diagnosis towards the end of 2018.  On pirfenidone.  Last CT scan of the chest September 2020. - > sept 2022  Underlying irritable bowel syndrome  Initial weight loss with pirfenidone and then stabilized  HPI Kenneth [name] 82 y.o. 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  Since I last saw him he has had several health issues.  He got diagnosed with prostate cancer and then had radiation to that.  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  He is going to have a colonoscopy.  He feels his irritable bowel syndrome pirfenidone and ultimately  the radiation made his GI symptoms significantly worse.  In order to combat the fatigue he has been undergoing physical therapy.  He also try to get back to golf and took a golf swing for practice and then states that he had a compression fracture in the spine.  This is probably also because of years of steroid injections in his back.  In addition when he had a high-resolution CT chest in September 2022 this month there was some neck nodes and he has had a neck CT scan followed by ENT visit to Dr. Jenne Pane.  Apparently fine-needle aspiration was nondiagnostic.  He is scheduled to undergo excision biopsy.  The combination of all this is gotten a little bit more anxious and depressed.  As you can see his symptom scores for depression and anxiety are up.  He is also feeling a little more short of breath than usual.  He says his pulse ox is in the low 90s when he exerts himself but it still adequate.  Review of his symptoms and objective basis shows his symptom score to be the same.  His walking desaturation test is also the same.  His weight is stable.  His pulmonary function test shows stability in diffusion but a decline in FVC compared to earlier this year.  Had a high-resolution CT chest that only shows minimal progression fibrosis over 2 years.  Echocardiogram September 2022 did not show any evidence of pulmonary hypertension     CLINICAL DATA:  Pulmonary fibrosis.   EXAM: CT CHEST WITHOUT CONTRAST   TECHNIQUE: Multidetector CT imaging of the chest was performed following the standard protocol without intravenous contrast. High resolution imaging of the lungs, as well as inspiratory and expiratory imaging, was performed.   COMPARISON:  03/28/2019, 03/30/2018 and 02/27/2017. Bone scan 05/21/2020.   FINDINGS: Cardiovascular: Atherosclerotic calcification of the aorta, aortic valve and coronary arteries. Enlarged pulmonic trunk and right and left main pulmonary arteries. Heart is at the upper  limits of normal in size. No pericardial effusion.   Mediastinum/Nodes: Low left internal jugular lymph  nodes measure up to 8 mm (2/8), previously 3 mm on 03/28/2019. Mediastinal lymph nodes are subcentimeter in short axis size, as before. Hilar regions are difficult to definitively evaluate without IV contrast. Left axillary lymph nodes measure up to 9 mm, are unchanged and not considered enlarged by CT size criteria. High left subpectoral lymph node measures 7 mm (2/11), increased from 4 mm on 03/28/2019. No right axillary adenopathy. Esophagus is grossly unremarkable. Periesophageal lymph nodes are subcentimeter in short axis size and appears similar.   Lungs/Pleura: Peripheral and basilar predominant subpleural reticulation, ground-glass and traction bronchiectasis/bronchiolectasis, minimally progressive from 03/28/2019. Evidence of progression is best seen in the lower lung zones. No pleural fluid. Airway is unremarkable. No air trapping.   Upper Abdomen: Visualized portions of the liver, gallbladder, adrenal glands, kidneys, spleen, pancreas, stomach and bowel are unremarkable.   Musculoskeletal: Degenerative changes in the spine. There is a new small rounded sclerotic lesion in the lateral aspect of the right third rib (sagittal image 32). New lucent lesion in the left posterolateral aspect of the T8 vertebral body measuring approximately 1.3 x 1.7 x 2.1 cm (2/84 and sagittal image 96). This likely corresponds to abnormal uptake seen on bone scan 05/21/2020. Finding is new from 03/28/2019. Slight compression of the T12 superior endplate is new.   IMPRESSION: 1. Slight interval progression of pulmonary fibrosis. Findings are consistent with UIP per consensus guidelines: Diagnosis of Idiopathic Pulmonary Fibrosis: An Official ATS/ERS/JRS/ALAT Clinical Practice Guideline. Am Rosezetta Schlatter Crit Care Med Vol 198, Iss 5, [id number], Mar 06 2017. 2. New small sclerotic lesion in the  lateral aspect of the right third rib, worrisome for metastatic prostate cancer. 3. Lucent lesion in the left posterolateral aspect of the T8 vertebral body corresponds to abnormal uptake on bone scan 05/21/2020, and is new from CT chest 03/28/2019, raising suspicion for malignancy. 4. Left internal and high left subpectoral lymph nodes are subcentimeter in size but appear slightly larger than on 03/28/2019. Difficult to exclude a lymphoproliferative disorder. Please refer to CT neck with contrast done the same day in further evaluation. 5. Slight compression of the T12 superior endplate is new but age indeterminate. 6. Aortic atherosclerosis (ICD10-I70.0). Coronary artery calcification. 7. Enlarged pulmonary arteries, indicative of pulmonary arterial hypertension.     Electronically Signed   By: Leanna Battles M.D.   On: 03/19/2021 15:12   Xxxxx  IMPRESSION: Neck CT September 2022 Mildly prominent lymph nodes in the neck bilaterally left greater than right. Cluster of left supraclavicular and left posterior lymph nodes all under 1 cm.   Largest lymph node in the neck is a right level 3 lymph node measuring 12 mm on axial image 75.     Electronically Signed   By: Marlan Palau M.D.   On: 03/19/2021 15:24   Xxxx IMPRESSIONS echocardiogram September 2022    1. Left ventricular ejection fraction, by estimation, is 65 to 70%. The  left ventricle has normal function. The left ventricle has no regional  wall motion abnormalities. Left ventricular diastolic parameters are  consistent with Grade I diastolic  dysfunction (impaired relaxation).   2. Right ventricular systolic function is normal. The right ventricular  size is normal. Tricuspid regurgitation signal is inadequate for assessing  PA pressure.   3. Left atrial size was mildly dilated.   4. The mitral valve is abnormal. Trivial mitral valve regurgitation. No  evidence of mitral stenosis.   5. The aortic valve is  tricuspid. There is mild calcification of the  aortic valve. There is moderate thickening of the aortic valve. Aortic  valve regurgitation is not visualized. Mild aortic valve stenosis. Aortic  valve area, by VTI measures 1.62 cm.  Aortic valve mean gradient measures 12.0 mmHg. Aortic valve Vmax measures  2.31 m/s. DI is 0.43.   6. Aortic dilatation noted. There is borderline dilatation of the aortic  root, measuring 39 mm. There is borderline dilatation of the ascending  aorta, measuring 39 mm.   7. The inferior vena cava is normal in size with <50% respiratory  variability, suggesting right atrial pressure of 8 mmHg.   Comparison(s): Changes from prior study are noted. 03/02/2017: LVEF 60-65%,  mild LVH, grade 1 DD, aortic root 39 mm, no aortic stenosis.   OV 06/17/2021  Subjective:  Patient ID: Kenneth [name], male , DOB: 10/19/1940 , age 67 y.o. , MRN: [id number] , ADDRESS: 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     HPI BRENSON HARTMAN 82 y.o. 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  Since his last visit from ILD standpoint he continues to be stable.  His walking desaturation test is stable.  However he is reporting more shortness of breath but this is directly related to more fatigue.  Review of the labs indicate in October 2022 he had mild anemia and also mild hyponatremia.  In the past radiation therapy for prostate made him extremely tired.  He is also on pirfenidone that can cause tiredness.  He now has lymphoma also that can cause tiredness.  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  He seen Dr. Rolm Baptise.  They are planning to start R-CHOP treatment after Christmas.  He is frustrated by the recurrence of various medical illnesses.  The second cancer since his IPF diagnosis.  In terms of his IPF: He is now on generic pirfenidone.  He feels the nausea is worse.  He wants to switch to branded Esbriet.  Gave him donor sample of Esbriet by [employer].  Lot number [id number] with expiration June 2025 [unopened bottle], lot number [id number] with expiration 05/2023 [unopened bottle and also lot number [id number] with expiration 12/2023 [partial bottle].  He is asked me to send a message to the pharmacist to make the switch.  He says even if it is more expensive he will play for the branded Esbriet because it is better quality of life.  He is worried that nausea and other side effects will get worse particularly once he starts chemotherapy.   No results found.   OV 09/18/2021  Subjective:  Patient ID: Kenneth [name], male , DOB: 1941-01-11 , age 64 y.o. , MRN: [id number] , ADDRESS: [address] [place] [address] PCP Rodrigo Ran, MD Patient Care Team: Rodrigo Ran, MD as PCP - General (Internal Medicine) Felicita Gage, RN as Oncology Nurse Navigator O'[place], Jacquelyne Balint, RN as Oncology Nurse Navigator  This Provider for this visit: Treatment Team:  Attending Provider:  Kalman Shan, MD  Follow-up idiopathic pulmonary fibrosis.  Diagnosis towards the end of 2018.  On pirfenidone.  Last CT scan of the chest September 2020. - > sept 2022  Underlying irritable bowel syndrome  Initial weight loss with pirfenidone and then stabilized  History of prostate cancer  New diagnosis of B-cell lymphoma cervical lymphadenopathy -late 2022  Multifactorial fatigue  - mild anemia OCt 2022  - mild hypontermia OCt 2022  09/18/2021 -    Chief Complaint  Patient presents with   Follow-up    PFT performed today.  Pt states he is about the same since last visit. States that he is currently undergoing chemotherapy due to diagnosis of lymphoma.     HPI OBALOLUWA DELATTE 82 y.o. -returns for follow-up.  He is taking Esbriet instead of generic pirfenidone.  He is also taking 3 little pills 3 times daily.  He finds that Esbriet to be better than generic pirfenidone.  He finds 3 little pills better than 1 whole pill in terms of nausea.  Still is dealing with a lot of nausea both from the chemo and Esbriet.  He says yesterday was really bad day with nausea.  He does take Zofran but we discussed and said he will try taking the Zofran 30 to 60 minutes before the Esbriet.  Nevertheless he has been able to gain some weight.  He is actually feeling slightly less short of breath.  He is able to do more particularly after his back pain has resolved.  Nevertheless he is on antiprostate cancer therapy.  He is also undergoing chemotherapy from his B-cell lymphoma.  His mood is better.  He says he is socially isolating significantly because of his pulm fibrosis and cancer history.  He wanted to know if he could go out.  He did go to [employer] for show but wore a mask.  I did indicate to him that he could follow the CDC map to see prevalence of respiratory viruses and when the problem is overall low he could take more risk.  Also like being outdoors is less risky.  If he is endorsing particularly a cluster putting a mask on reduces his risk.  But I did say that he could use these guardrails and be more active so he can get some socialization and help his mood.  Can also give him purpose.  He is now sold his house and is planning to move into [facility].  In terms of his walking desaturation test and symptoms he stable.  In terms of his pulmonary function test his DLCO is stable but his FVC is down.  He is feeling similar.  Most of the decline  seems to be from several years ago but most recently appears to be stable overall. We did discuss clinical trials but cancer generally is an exclusion but otherwise he would be interested.  We did discuss potential for doing inhaled treprostinil trial.  We did discuss the fact the neck step and standard of care would be oxygen if he were to decline but so far he has held good.  He recently lost his friend [facility] who also had pulmonary fibrosis.     He has upcoming [employer] appointment but he missed the 1 earlier in the year because of all ischemia.  He is thinking about holding off on going to [facility] and just following here.  I was fine with this.   OV 10/28/2021  Subjective:  Patient ID: Martin Majestic  Bass, male , DOB: July 25, 1940 , age 45 y.o. , MRN: [id number] , ADDRESS: [address] [place] [address] PCP Rodrigo Ran, MD Patient Care Team: Rodrigo Ran, MD as PCP - General (Internal Medicine) Felicita Gage, RN as Oncology Nurse Navigator [place], Jacquelyne Balint, RN as Oncology Nurse Navigator  This Provider for this visit: Treatment Team:  Attending Provider: Kalman Shan, MD    10/28/2021 -   Chief Complaint  Patient presents with   Acute Visit    Pt states since his last chemo treatment, he has been having problems with his breathing and also states that his pulse has been elevated.     Kenneth [name] 82 y.o. -presents with his wife.  Status meeting his wife for the first time but she reminded me that she had made him early on in the course of his illness.  He is finished cycles of R-CHOP treatment for his lymphoma.  On 10/23/2021 Thursday he saw a nurse practitioner in oncology.  At that point it was noticed that he had finished mini CHOP treatment 5 rounds 10/02/2021.  He did complete his 6 cycle.  He was feeling well.  His resting heart rate was 82.  Pulse ox at rest was 100%.  His white count was observed to be slightly low.  They discussed several options and decided to  proceed with chemotherapy followed by [facility] single dose on 10/24/2021 Friday.  He was doing fairly okay then on Sunday, 10/26/2021 he noticed resting tachycardia heart rate 125 but no fever.  At no point any fever.  No cough no chills no nausea no vomiting no diarrhea.  The same thing persisted even on Monday, 10/27/2021.  He noticed that if he walked his heart rate was up to 140 he became short of breath and stop.  His pulse ox would drop to 90% but no abnormal desaturations.  He started getting worried.  He went to [employer] yesterday 10/27/2021.  EKG reviewed sinus tachycardia with some ectopic atrial beats.  Nothing acute.    Labs: Found to have mild hyponatremia.  Also found to have mild anemia white count in response to Udenyca went from low to  high.No D-dimer.  No troponin no BNP checked.  Last echo and CT scan of the chest in September 2022.  Walking desaturation test is definitely more tachycardic than baseline.  Associated with this he is also got exaggerated pulse ox drop although still adequate for simple walking test.  His pace is slower.   We repeated an EKG here: Personally visualized.  Sinus tachycardia heart rate 120 approximately.  Nothing acute.      OV 01/09/2022  Subjective:  Patient ID: Kenneth [name], male , DOB: Jan 10, 1941 , age 64 y.o. , MRN: [id number] , ADDRESS: [address] [place] [address] [place] [facility] [address] PCP Rodrigo Ran, MD Patient Care Team: Rodrigo Ran, MD as PCP - General (Internal Medicine) Felicita Gage, RN as Oncology Nurse Navigator O'[place], Jacquelyne Balint, RN as Oncology Nurse Navigator  This Provider for this visit: Treatment Team:  Attending Provider: Kalman Shan, MD    01/09/2022 -   Chief Complaint  Patient presents with   Follow-up    PFT performed today. Pt states that he feels like he may be a little worse since last visit. States that he has had some problems with congestion which he wonders could be due to the weather.   HPI ERYN KREJCI 82 y.o. 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  At  this point in time he is finished his chemotherapy.  In April 2023 he did have some tachycardic response following the last chemo.  Cardiac work-up ended up being fine.  He says overall he is been feeling fine.  He is now relocated to a retirement home in [place].  [place]  During this time his pulse ox immediately after exertion was always in the 90s.  He did not feel that his IPF was getting worse when he was there.  He just came back yesterday.  However for the last few days he is having increased fatigue nasal congestion.  He also feels his left ear is blocked.  He tried to do pulmonary function test today.  He felt he did not give a good effort particularly the DLCO.  Consistent with this his numbers are much worse.  His FVC shows a 7% decline compared to March 2023 and DLCO 14% decline compared to March 2023.  His symptom score seems similar to September 2022.  His walking desaturation test is also stable on this level ground.  His weight loss is stabilized since he is at 168 pounds.  He is frustrated by this.  Is also frustrated by his multiple health issues.  The pirfenidone he is taking the generic version.  He is tolerating it but is requiring Zofran which then causes constipation.  Emotionally he is looking for a break from all his health issues  He plans [place] today.     01/26/2022: Today - follow up Patient presents today for follow up after being treated for sinusitis. He reports feeling better but still has issues with nasal congestion and pressure in his left ear. They did improve with prednisone taper. These are problems he has been dealing with for a while now. He has tried multiple different nasal sprays with some relief. 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 He is sure this was the result of his worsening pulmonary function testing. He is  working on getting in to see Dr. Jenne Pane with ENT. Otherwise, no concerns today. Breathing is overall stable. He gets winded with stair climbing but otherwise, no issues. Doesn't feel like his activity is limited by his breathing. He's planning to go back [place] the mountains in the next few weeks.     OV 04/07/2022  Subjective:  Patient ID: Kenneth [name], male , DOB: [birth year] , age 81 y.o. , MRN: [id number] , ADDRESS: [address] [place] [address] [place] [facility] [address] PCP Rodrigo Ran, MD Patient Care Team: Rodrigo Ran, MD as PCP - General (Internal Medicine) Felicita Gage, RN as Oncology Nurse Navigator [place], Jacquelyne Balint, RN as Oncology Nurse Navigator  This Provider for this visit: Treatment Team:  Attending Provider: Kalman Shan, MD   04/07/2022 -   Chief Complaint  Patient presents with   Follow-up    Spiro/DLCO done today. Breathing has been gradually worse since the last visit. He has had some muscle fatigue with exertion. He has some occ cough- non prod.      HPI GLENFORD GARIS 82 y.o. -returns for follow-up.  He feels he is slowly progressing in terms of his IPF.  In fact his shortness of breath scores are slightly worse.  Last time his pulmonary function test showed a decline he thought was from sinusitis.  Sinusitis is cleared up but he says today when he did his  PFTs he felt that he was declining.  He did have some cough he is having some new cough for the last few weeks.  He is worried this might be from worsening IPF but he typically gets cough this time of the year from fall allergy season.  Pulmonary function test shows an improvement compared to the most recent one but overall compared to earlier this year there is a decline.  I shared these results with him.  He is more worried about his fatigue.  However he did play some golf.  He says that he is significantly fatigued.  He has been going on for 6 to 12 months initially thought was related to cancer and chemotherapy but he says it  is the all the time.  The fatigue persist despite anemia correction despite his chemotherapy completing is still got fatigue.  He feels it happens episodically but then it is there all over the body and it exhausting.  He believes his primary care is checked his vitamin D levels because he is on replacement.  He believes his TSH and hemoglobin A1c to be normal.  Apparently primary care physician has asked him to consider getting a CT coronaries.  He has seen Dr. Marca Ancona in the past.  At the time it was from acute chemotherapy consideration.  Dr. Shirlee Latch at the time got an echocardiogram.  He does have enlarged pulmonary artery on the CT scan and he does have coronary artery calcification.  Back then in the spring during chemo side effects patient had deferred right heart catheterization.  This time of reach out to Dr. Shirlee Latch   He is due for an IPF-Pro registry visit today    OV 06/23/2022  Subjective:  Patient ID: Kenneth [name], male , DOB: [birth year] , age 13 y.o. , MRN: [id number] , [address]ESS: [address] [address] Ginette Otto [facility] [address] PCP Rodrigo Ran, MD Patient Care Team: Rodrigo Ran, MD as PCP - General (Internal Medicine) Felicita Gage, RN as Oncology Nurse Navigator O'Kelley, Jacquelyne Balint, RN as Oncology Nurse Navigator  This Provider for this visit: Treatment Team:  Attending Provider: Kalman Shan, MD    06/23/2022 -   Chief Complaint  Patient presents with   Follow-up    Pt states he has been doing okay since last visit.     HPI STEVENSON WINDMILLER 82 y.o. 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  Overall he is in a better state of health.  His fatigue is improved.  His shortness of breath is also improved somewhat.  He continues pirfenidone at full dose.  His last chemotherapy was in J May 2023.  His last androgen deprivation treatment was in July 2023.  He believes the chemo and the androgen deprivation was causing his fatigue.  With the passage of time he is better.  He is now living  in [facility].  He is doing daily exercise.  He says that he can go well on a flat ground but when he does uphill sometimes he has to stop but most of the time he is doing well.  He does workout with exercise bike.  He states his tachycardia has improved with more more physical conditioning.  However he does get fatigued and short of breath when he tries to play golf.  His GI issues in combination of irritable bowel syndrome and pirfenidone continue but it is mild level.  He says 2023 has been a rough year and has been emotional.  Therefore primary care physician started  him on Pristiq on the advice of his daughter.  He says since then he somewhat better.  He did have sinusitis a few weeks ago and now he is better.  Take prednisone and cephalosporin.  Social note: We talked about masking particularly with the surgery for respiratory viruses.  On July 25, 2022 he is going to get the Carren Rang award for coverage at [facility].  He is going to talk about pulmonary fibrosis.  I encouraged him to be an advocate for pulmonary fibrosis.  He said in the past is The diagnosis private.  I did tell him that given the serious nature of the disease and rare disease I would encourage him to be as open as he wants to so there is more awareness and advocacy for the disease.  I also advised him to mask for this gathering to the extent possible as a risk reduction against respiratory viruses.    OV 10/13/2022  Subjective:  Patient ID: Kenneth [name], male , DOB: 12-26-1940 , age 41 y.o. , MRN: [id number] , ADDRESS: [address] [place] [facility] [address] PCP Rodrigo Ran, MD Patient Care Team: Rodrigo Ran, MD as PCP - General (Internal Medicine) Felicita Gage, RN as Oncology Nurse Navigator O'Kelley, Jacquelyne Balint, RN as Oncology Nurse Navigator  This Provider for this visit: Treatment Team:  Attending Provider: Kalman Shan, MD  10/13/2022 -   Chief Complaint  Patient presents with   Follow-up     F/up     HPI Kenneth [name] 82 y.o. -returns for follow-up.  On 08/11/2022 he underwent elective neck surgery by Dr. Dutch Quint because of cervical spondylosis and radiculopathy.  Since then the pain is resolved.  After this on 08/23/2022 he ended up with tachycardia in the ED.  Pulmonary embolism ruled out.  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  He is on beta-blocker.  He says he is a little more short of breath doing things around the house.  He is also having significant PVCs.  He is on Toprol.  His chronic anemia continues.  The PVC issue is new he is frustrated by this.  He had pulmonary function test today and it shows a definite decline compared to a year ago.  He is also lost weight with full dose pirfenidone.  He continues have significant fatigue.  Is also reporting some new nausea.  I did indicate to him that pirfenidone is the most likely culprit for the trial of weight loss fatigue and nausea.  We discussed taking a holiday with the pirfenidone but we took a shared decision making to reduce the dose and monitor.  In addition for his PVCs need to see if his ILD has gotten worse.  Which it has on the pulmonary function test.  He has agreed to get a overnight pulse oximetry study done.  He is somewhat despondent because of all his medical issues.       OV 01/04/2023  Subjective:  Patient ID: Kenneth [name], male , DOB: 08-Oct-1940 , age 83 y.o. , MRN: [id number] , ADDRESS: [address] [place] [facility] [address] PCP Rodrigo Ran, MD Patient Care Team: Rodrigo Ran, MD as PCP - General (Internal Medicine) Felicita Gage, RN as Oncology Nurse Navigator O'Kelley, Jacquelyne Balint, RN as Oncology Nurse Navigator  This Provider for this visit: Treatment Team:  Attending Provider: Kalman Shan, MD       01/04/2023 -   Chief Complaint  Patient presents with  Follow-up    F/up on PFT     HPI OLUWATOSIN HIGGINSON 82 y.o. -returns for follow-up.  In this visit he reports that after moving to  [facility] and the food quality is good he has gained weight.  His appetite is good.  His weight is stable.  However he does feel that he is declining.  He says symptoms are worse.  In fact the dyspnea symptom score shows that he is slowly declining with more dyspnea.  He is back on full dose pirfenidone.  The lower dose did not improve his fatigue.  We discussed and we agreed that his fatigue is likely multifactorial from the disease other medical issues and medications including Lopressor for PVC.  Today's exercise hypoxemia test was normal.  He is somewhat frustrated but also sanguine about the fact that he is declining.  His pulmonary function test does show decline.  I personally reviewed it.  Last set of labs was in May 2024 with a creatinine of 0.97 mg percent.  Last liver function test was in April 2024 normal.   Encounter therapeutic monitoring with high risk prescription pirfenidone  - Esbriet/Pirfenidone requires intensive drug monitoring due to high concerns for Adverse effects of , including  Drug Induced Liver Injury, significant GI side effects that include but not limited to Diarrhea, Nausea, Vomiting,  and other system side effects that include Fatigue, headaches, weight loss and other side effects such as skin rash. These will be monitored with  blood work such as LFT initially once a month for 6 months and then quarterly  -He will need liver function test today.   Other issues - Did see Dr. Truett Perna 10/06/2022.  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  -We discussed the progression.  We felt the next best option is clinical trials but a lot of the trials excluded patients who have cancer.  Did express that currently 2 trials have high potential for future approval.  This including inhaled treprostinil versus placebo.  The other is Bexotegrast versus placebo.  Did indicate to him we will prescreen him and then decide which 1 he would be suitable for.  He is open to the  idea of clinical trials.  Did communicate with the research coordinator Neita Garnet       OV 05/13/2023  Subjective:  Patient ID: Kenneth [name], male , DOB: April 29, 1941 , age 35 y.o. , MRN: [id number] , ADDRESS: [address] [place] [facility] [address] PCP Rodrigo Ran, MD Patient Care Team: Rodrigo Ran, MD as PCP - General (Internal Medicine) Felicita Gage, RN as Oncology Nurse Navigator O'Kelley, Jacquelyne Balint, RN as Oncology Nurse Navigator  This Provider for this visit: Treatment Team:  Attending Provider: Kalman Shan, MD    05/13/2023 -   Chief Complaint  Patient presents with   Follow-up    Pt denies any concerns, still on esbriet.     Follow-up idiopathic pulmonary fibrosis.  Diagnosis towards the end of 2018.  On pirfenidone.  Last CT scan of the chest September 2020. 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  Depression and started on Pristiq by primary care physician in 2023  Esbriet/Pirfenidone requires intensive drug monitoring due to high concerns for Adverse effects of , including  Drug Induced  Liver Injury, significant GI side effects that include but not limited to Diarrhea, Nausea, Vomiting,  and other system side effects that include Fatigue, headaches, weight loss and other side effects such as skin rash. These will be monitored with  blood work such as LFT initially once a month for 6 months and then quarterly   HPI Huey A Shubert 82 y.o. -returns for follow-up.  He is now being randomized on inhaled treprostinil versus placebo on the [facility] 301 study.  He is taking the nebulizer 4 times daily and each time 5 times.  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  He has no sore throat no chest pain no headache no dizziness no hemoptysis.  He feels good.  He continues his pirfenidone.  He is maintaining his weight.  His pulmonary function test shows slight improvement after continued progression.  He is back to being at the level of spring 2024.  He was excited to hear this.  We did tell him that we have no idea if he is on placebo the real drug.  He plans to go to [place].  I did tell him that he needs to take his study medication with him and follow the instructions to the T.  He has had labs with research and I will review those.      SYMPTOM SCALE - ILD 08/23/2018  04/21/2019  10/16/2019  04/11/2020 168# 10/22/2020 170# - esbriet and XRT for prostate 04/03/2021 168# 06/17/2021 168# lymphoma 09/18/2021 172# 01/09/2022 168# 04/07/2022 173# 06/23/2022 171#  10/13/2022 164# esbiret 01/04/2023 164# Esbret full dose 05/13/2023 Esbriet + [place] 301 study 167#  O2 use ra e ra   ra ra ra ra ra ra ra ra ra  Shortness of Breath 0 -> 5 scale with 5 being worst (score 6 If unable to do)               At rest 0 0 0 0 0 0 0 0 0 0  0 0 0 0  Simple tasks - showers, clothes change, eating, shaving *0** 0 0 0 0 0 0 0 0 1 0  1 1 1   Household (dishes, doing bed, laundry) 0 0 0 0 0 1 1 0 1 1  1 2 3 2   Shopping 0 0 0 0 0 0 1 0 1 1  1  2 3 2   Walking level at own pace 0 0 0 0 0 0 1 0 1 1  1 2 1 1   Walking up Stairs 2 2 2 1 2 2 3 2 3 4 3 4 4 3   Total (40 - 48) Dyspnea Score 2 2 2 1 2 3 6  2  6 8 6 11 12 9   How bad is your cough? 0 0 0 0 0 0 1 2 0 1  0 0 0 0  How bad is your fatigue 1 due to esbriet 0 2 0 2 1 3.5 2 3 3 2  2.5 3 3   nausea   1 0 1 1 3 0 1 1 1 2  3 2  vomit   0 0 0 0 0 0 0 0 0 0  0 0  diarrhea   1 1 3 1 2  0 1 1 1 2 1  0  anxiety   0 0 0 1 1 1 2 0 0 1  2 1  depresso0   0 0 0 1 1 1 2 0 1 1  2 1       Simple office walk 185 feet x  3 laps goal with forehead probe 12/07/2017 Weight 180# 03/08/2018 Weight 174.9 05/10/2018 Weight 174# 08/23/2018 Weight 178# 04/21/2019 Weight 170# 10/16/2019 173# 04/11/2020 168# 10/22/2020 170# 04/03/2021 168# 06/17/2021 168# 09/18/2021  10/28/2021  01/09/2022 168# 06/23/2022 171# 01/04/2023  05/13/2023   O2 used Room air Room air Room air Room air Room air Room air   ra ra ra  ra ra ra ra  Number laps completed 3 3 3 3 3 3   3  3     Sit stand x 15 Sist stand x 10  Comments about pace Moderate pace Mod pace Mod fast pace fast  fast   fast   Avg pace - slower than before      Resting Pulse Ox/HR 100% and 83/min 100% ad 85/min 100% and 91/min 100% and 82/min  100% asnd 91 100% and 83/min 98% and 94/min 100% and 82 100% and 93 100% and 78 99% and H 126 99% and HR 88 100% ahd HR 87 97% andHR 70 95$ and HR 72  Final Pulse Ox/HR 99% and 98/min 98$ and 104/min 98% and 108/min 98% and 100/min  96% and 106/mom 98% and 96/mn 95% and 110.min 97% and 105 97% ad 109 97% and 94 92% and HR 143 97% and HR 111 96% and HR 110 96% and HR 96 92% and HR 92  Desaturated </= 88% no no no no  no            Desaturated <= 3% points no no ni no  yesm 4 points  Yes, 3 points Yes, 3 poitn Yes 3 pomnts Yes 3 Yes, 7 points  Yes 4 ponts    Got Tachycardic >/= 90/min yes yes yes yes  yes  yes yes yes yes yes      Symptoms at end of test No complaints No complaints  No complaints  Mild dyspnea  no Mild dyspnea Mild dyspnea Mild  dysnea Modeate dyspnea  No complaints Little dyspnea   Miscellaneous comments x ? sligh increase in tachy/stable ? incraeased tachty       Stable since April 2021             PFT     Latest Ref Rng & Units 05/13/23 Now in teton 301 study 01/04/2023    8:51 AM 10/09/2022    4:21 PM 04/07/2022    1:46 PM 01/09/2022    9:17 AM 09/18/2021    8:52 AM 04/03/2021    9:21 AM 10/21/2020    8:56 AM  ILD indicators  FVC-Pre L 2.37 2.27  2.33  2.53  2.49  2.68  2.96  3.20   FVC-Predicted Pre %  62  63  68  64  68  75  81   DLCO uncorrected ml/min/mmHg  10.66  11.48  12.36  12.55  13.07  16.15  13.64   DLCO UNC %Pred %  46  50  53  53  54  67  56   DLCO Corrected ml/min/mmHg 11.71 10.66  12.81  13.57  12.55  14.64  16.15  13.64   DLCO COR %Pred %  46  55  59  53  61  67  56       LAB RESULTS last 96 hours No results found.  LAB RESULTS last 90 days No results found for this or any previous visit (from the past 2160 hour(s)).       has a past medical history of Arthritis, Depression, Diffuse large B cell lymphoma (HCC) (2023), Diverticulosis of colon (without mention of hemorrhage), GERD (gastroesophageal reflux disease), Hiatal hernia, HLD (hyperlipidemia), Internal hemorrhoids without mention of complication, Irritable bowel syndrome, Other specified gastritis without mention of hemorrhage, Prostate cancer (HCC) (2022), Pulmonary fibrosis (HCC), and Stress fracture (02/2021).   reports that he has never smoked. He has never used smokeless tobacco.  Past Surgical History:  Procedure Laterality Date   ANTERIOR CERVICAL DECOMP/DISCECTOMY FUSION N/A 08/11/2022   Procedure: Anterior Cervical Decompression/Discectomy Fusion - Cervical Four-Cervical Five -  Cervical Five-Cervical Six - Cerivcal Six-Cerival Seven;  Surgeon: Julio Sicks, MD;  Location: [facility] OR;  Service: Neurosurgery;  Laterality: N/A;   CARPAL TUNNEL RELEASE     left   CATARACT EXTRACTION  06/2011   bilateral   DEEP NECK LYMPH NODE  BIOPSY / EXCISION  03/25/2021   INGUINAL HERNIA REPAIR Right 09/05/2019   Procedure: OPEN REPAIR RIGHT INGUINAL HERNIA WITH MESH;  Surgeon: Darnell Level, MD;  Location: [facility] ORS;  Service: General;  Laterality: Right;   LUMBAR DISC SURGERY  1986, 1995   x 2   LUMBAR LAMINECTOMY/DECOMPRESSION MICRODISCECTOMY Left 07/11/2013   Procedure: LUMBAR ONE TO TWO, LUMBAR TWO TO THREE, LUMBAR THREE TO FOUR LUMBAR LAMINECTOMY/DECOMPRESSION MICRODISCECTOMY 3 LEVELS;  Surgeon: Temple Pacini, MD;  Location: [facility] NEURO ORS;  Service: Neurosurgery;  Laterality: Left;   NASAL SINUS SURGERY     RIGHT/LEFT HEART CATH AND CORONARY ANGIOGRAPHY N/A 04/14/2022   Procedure: RIGHT/LEFT HEART CATH AND CORONARY ANGIOGRAPHY;  Surgeon: Laurey Morale, MD;  Location: [facility] INVASIVE CV LAB;  Service: Cardiovascular;  Laterality: N/A;    No Known Allergies  Immunization History  Administered Date(s) Administered   Fluad Quad(high Dose 65+) 03/18/2020   Influenza Split 10/03/2009, 03/28/2010, 04/14/2011, 03/22/2012, 03/09/2013, 03/28/2014   Influenza, High Dose Seasonal PF 03/19/2016, 03/06/2017, 04/09/2018, 03/07/2019, 04/06/2020, 04/11/2022   Influenza, Quadrivalent, Recombinant, Inj, Pf 03/12/2018, 03/10/2019, 04/06/2020   Influenza,inj,Quad PF,6+ Mos 04/04/2014, 03/26/2015, 04/05/2016   Influenza-Unspecified 04/05/2016, 04/05/2021   PFIZER Comirnaty(Gray Top)Covid-19 Tri-Sucrose Vaccine 10/15/2020   PFIZER(Purple Top)SARS-COV-2 Vaccination 07/20/2019, 08/10/2019, 02/27/2020, 03/23/2021   Pfizer Covid-19 Vaccine Bivalent Booster 5y-11y 10/05/2022   Pneumococcal Conjugate-13 03/30/2013, 07/31/2013, 04/26/2018   Pneumococcal Polysaccharide-23 10/03/2009, 01/14/2010, 03/31/2018, 02/18/2021   Pneumococcal-Unspecified 04/05/2016   Td 10/20/2016   Tdap 10/03/2009   Zoster Recombinant(Shingrix) 01/03/2017   Zoster, Live 10/03/2009    Family History  Problem Relation Age of Onset   Heart failure Father 43   Bladder Cancer  Mother    Breast cancer Mother    Colon cancer Neg Hx    Pancreatic cancer Neg Hx    Rectal cancer Neg Hx    Stomach cancer  Neg Hx    Prostate cancer Neg Hx      Current Outpatient Medications:    acetaminophen (TYLENOL) 500 MG tablet, Take 1,000 mg by mouth every 6 (six) hours as needed for moderate pain., Disp: , Rfl:    bismuth subsalicylate (PEPTO BISMOL) 262 MG/15ML suspension, Take 30 mLs by mouth every 6 (six) hours as needed for diarrhea or loose stools or indigestion., Disp: , Rfl:    carboxymethylcellulose (REFRESH TEARS) 0.5 % SOLN, Place 1 drop into both eyes daily as needed (dry eyes)., Disp: , Rfl:    carisoprodol (SOMA) 250 MG tablet, Take 350 mg by mouth 4 (four) times daily as needed. Takes 1/2 tablet each dose, Disp: , Rfl:    Cholecalciferol (VITAMIN D-3) 1000 UNITS CAPS, Take 2,000 Units by mouth every evening., Disp: , Rfl:    CO-ENZYME Q-10 PO, Take 1 tablet by mouth every evening. , Disp: , Rfl:    Cyanocobalamin (VITAMIN B12) 1000 MCG TBCR, Take 1,000 mcg by mouth daily., Disp: , Rfl:    denosumab (PROLIA) 60 MG/ML SOSY injection, Inject 60 mg into the skin every 6 (six) months. At [facility], Disp: , Rfl:    desvenlafaxine (PRISTIQ) 50 MG 24 hr tablet, Take 50 mg by mouth daily., Disp: , Rfl:    diclofenac (VOLTAREN) 75 MG EC tablet, Take 75 mg by mouth 2 (two) times daily as needed (pain.)., Disp: , Rfl: 2   diclofenac sodium (VOLTAREN) 1 % GEL, Apply 4 g topically 4 (four) times daily as needed (pain.)., Disp: , Rfl: 12   dicyclomine (BENTYL) 10 MG capsule, Take one by mouth every 6 hours as needed for abdominal cramping, Disp: 30 capsule, Rfl: 1   diphenoxylate-atropine (LOMOTIL) 2.5-0.025 MG tablet, Take 1 tablet by mouth 4 (four) times daily as needed for diarrhea or loose stools., Disp: , Rfl:    Emollient (AQUAPHOR ADV PROTECT HEALING EX), Apply 1 Application topically daily as needed (itching)., Disp: , Rfl:    Evolocumab (REPATHA [place]), Inject 140 mg  into the skin every 30 (thirty) days., Disp: , Rfl:    Fluticasone Propionate (XHANCE) 93 MCG/ACT EXHU, Place 1 spray into both nostrils 2 (two) times daily as needed (sinus)., Disp: , Rfl:    Glucosamine Sulfate 500 MG TABS, Take 500 mg by mouth daily., Disp: , Rfl:    HYDROcodone-acetaminophen (NORCO/VICODIN) 5-325 MG tablet, Take 1 tablet by mouth every 4 (four) hours as needed for moderate pain ((score 4 to 6))., Disp: 30 tablet, Rfl: 0   latanoprost (XALATAN) 0.005 % ophthalmic solution, Place 1 drop into both eyes at bedtime., Disp: , Rfl:    loratadine (CLARITIN) 10 MG tablet, Take 10 mg by mouth daily., Disp: , Rfl:    meclizine (ANTIVERT) 25 MG tablet, Take 25 mg by mouth 3 (three) times daily as needed for dizziness. , Disp: , Rfl:    methocarbamol (ROBAXIN) 500 MG tablet, Take 500 mg by mouth every 8 (eight) hours as needed (severe muscle spasms)., Disp: , Rfl:    metoprolol succinate (TOPROL XL) 25 MG 24 hr tablet, Take 1 tablet (25 mg total) by mouth 2 (two) times daily., Disp: 60 tablet, Rfl: 11   Multiple Vitamin (MULTIVITAMIN) tablet, Take 1 tablet by mouth daily., Disp: , Rfl:    omeprazole (PRILOSEC) 40 MG capsule, TAKE 1 CAPSULE (40 MG TOTAL) BY MOUTH DAILY., Disp: 30 capsule, Rfl: 0   ondansetron (ZOFRAN) 8 MG tablet, TAKE 1 TABLET BY MOUTH EVERY 8 HOURS  AS NEEDED FOR NAUSEA AND VOMITING, Disp: 30 tablet, Rfl: 1   Pirfenidone (ESBRIET) 801 MG TABS, Take 1 tablet (801 mg total) by mouth 3 (three) times daily with meals., Disp: 270 tablet, Rfl: 1   Probiotic Product (ALIGN) 4 MG CAPS, Take 4 mg by mouth daily., Disp: , Rfl:       Objective:   Vitals:   05/13/23 1352  BP: 108/68  Pulse: 74  SpO2: 95%  Weight: 169 lb (76.7 kg)  Height: 5\' 8"  (1.727 m)    Estimated body mass index is 25.7 kg/m as calculated from the following:   Height as of this encounter: 5\' 8"  (1.727 m).   Weight as of this encounter: 169 lb (76.7 kg).  @WEIGHTCHANGE @  [employer]   05/13/23  1352  Weight: 169 lb (76.7 kg)     Physical Exam   General: No distress. Looks well O2 at rest: no Cane present: no Sitting in wheel chair: no Frail: no Obese: no Neuro: Alert and Oriented x 3. GCS 15. Speech normal Psych: Pleasant Resp:  Barrel Chest - no.  Wheeze - no, Crackles - mild , No overt respiratory distress CVS: Normal heart sounds. Murmurs - no Ext: Stigmata of Connective Tissue Disease - no HEENT: Normal upper airway. PEERL +. No post nasal drip        Assessment:       ICD-10-CM   1. 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HCC)  J84.112     2. Encounter for therapeutic drug monitoring  Z51.81          Plan:     Patient Instructions  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HCC) Encounter for therapeutic drug monitoring Nausea due to drug  -  IPF slowly getting worse 2022 -> oct 2023. On 05/13/2023 PFTs seem better and improved to spring 2024 levels   - ? Due to being on study  -curently on research study inhaled tyvaso v placebo on [place]ETOn 301 study  -on esbreit/pirfenidone  Plan  - continue pirfenidone at full dose - monitor pulse ox even with exertion - goal >= 88%  -will reviuew lab data in research    Fatigue - significant  in 2023 and on 01/04/2023 Weight loss - stopped as of April 2024 an and July 2024 Nausea - some 10/13/2022 and 01/04/2023 05/13/2023 - mild    -symptoms are due to esbriet and medical issues  Plan - cotninue esbriet but manage symptoms as outlined before    Followup - 6 months - 30 min visit with Dr Marchelle Gearing   - symptom score and walk test at followup  - can cancel if needed if research data going well   FOLLOWUP No follow-ups on file.    SIGNATURE    Dr. Kalman Shan, M.D., F.C.C.P,  Pulmonary and Critical Care Medicine Staff Physician, [facility] Director - Interstitial Lung Disease  Program  Pulmonary Fibrosis [facility] at [facility] [place], [place],  [address]  Pager: [phone], If no answer or between  15:00h - 7:00h: call [phone] Telephone: [phone]  2:34 PM 05/13/2023

## 2023-05-13 NOTE — Progress Notes (Signed)
Spirometry/DLCO was performed today.

## 2023-05-13 NOTE — Patient Instructions (Signed)
Spirometry/DLCO performed today. 

## 2023-05-18 ENCOUNTER — Ambulatory Visit: Payer: PPO | Admitting: Oncology

## 2023-05-18 ENCOUNTER — Other Ambulatory Visit: Payer: PPO

## 2023-05-24 ENCOUNTER — Ambulatory Visit (HOSPITAL_BASED_OUTPATIENT_CLINIC_OR_DEPARTMENT_OTHER)
Admission: RE | Admit: 2023-05-24 | Discharge: 2023-05-24 | Disposition: A | Discharge status: Home / Self Care | Payer: PPO | Source: Ambulatory Visit | Attending: Cardiology | Admitting: Cardiology

## 2023-05-24 ENCOUNTER — Inpatient Hospital Stay: Payer: PPO | Admitting: Oncology

## 2023-05-24 ENCOUNTER — Encounter (HOSPITAL_COMMUNITY): Payer: Self-pay | Admitting: Cardiology

## 2023-05-24 ENCOUNTER — Inpatient Hospital Stay: Payer: PPO | Attending: Oncology

## 2023-05-24 VITALS — BP 120/58 | HR 84 | Wt 169.0 lb

## 2023-05-24 VITALS — BP 120/58 | HR 77 | Temp 98.1°F | Resp 18 | Ht 68.0 in | Wt 169.3 lb

## 2023-05-24 DIAGNOSIS — Z8249 Family history of ischemic heart disease and other diseases of the circulatory system: Secondary | ICD-10-CM | POA: Insufficient documentation

## 2023-05-24 DIAGNOSIS — J84112 Idiopathic pulmonary fibrosis: Secondary | ICD-10-CM

## 2023-05-24 DIAGNOSIS — Z8052 Family history of malignant neoplasm of bladder: Secondary | ICD-10-CM | POA: Insufficient documentation

## 2023-05-24 DIAGNOSIS — I5022 Chronic systolic (congestive) heart failure: Secondary | ICD-10-CM | POA: Diagnosis not present

## 2023-05-24 DIAGNOSIS — K5730 Diverticulosis of large intestine without perforation or abscess without bleeding: Secondary | ICD-10-CM | POA: Diagnosis not present

## 2023-05-24 DIAGNOSIS — R0602 Shortness of breath: Secondary | ICD-10-CM | POA: Insufficient documentation

## 2023-05-24 DIAGNOSIS — Z79899 Other long term (current) drug therapy: Secondary | ICD-10-CM | POA: Insufficient documentation

## 2023-05-24 DIAGNOSIS — R0609 Other forms of dyspnea: Secondary | ICD-10-CM | POA: Insufficient documentation

## 2023-05-24 DIAGNOSIS — C8291 Follicular lymphoma, unspecified, lymph nodes of head, face, and neck: Secondary | ICD-10-CM | POA: Diagnosis not present

## 2023-05-24 DIAGNOSIS — C8330 Diffuse large B-cell lymphoma, unspecified site: Secondary | ICD-10-CM | POA: Diagnosis not present

## 2023-05-24 DIAGNOSIS — E785 Hyperlipidemia, unspecified: Secondary | ICD-10-CM | POA: Diagnosis not present

## 2023-05-24 DIAGNOSIS — K648 Other hemorrhoids: Secondary | ICD-10-CM | POA: Insufficient documentation

## 2023-05-24 DIAGNOSIS — D649 Anemia, unspecified: Secondary | ICD-10-CM | POA: Diagnosis not present

## 2023-05-24 DIAGNOSIS — I350 Nonrheumatic aortic (valve) stenosis: Secondary | ICD-10-CM | POA: Insufficient documentation

## 2023-05-24 DIAGNOSIS — I2510 Atherosclerotic heart disease of native coronary artery without angina pectoris: Secondary | ICD-10-CM | POA: Insufficient documentation

## 2023-05-24 DIAGNOSIS — G4733 Obstructive sleep apnea (adult) (pediatric): Secondary | ICD-10-CM | POA: Insufficient documentation

## 2023-05-24 DIAGNOSIS — K589 Irritable bowel syndrome without diarrhea: Secondary | ICD-10-CM | POA: Diagnosis not present

## 2023-05-24 DIAGNOSIS — Z7982 Long term (current) use of aspirin: Secondary | ICD-10-CM | POA: Insufficient documentation

## 2023-05-24 DIAGNOSIS — Z803 Family history of malignant neoplasm of breast: Secondary | ICD-10-CM | POA: Insufficient documentation

## 2023-05-24 DIAGNOSIS — C61 Malignant neoplasm of prostate: Secondary | ICD-10-CM | POA: Diagnosis not present

## 2023-05-24 DIAGNOSIS — I493 Ventricular premature depolarization: Secondary | ICD-10-CM | POA: Insufficient documentation

## 2023-05-24 LAB — CBC WITH DIFFERENTIAL (CANCER CENTER ONLY)
Abs Immature Granulocytes: 0.01 10*3/uL (ref 0.00–0.07)
Basophils Absolute: 0 10*3/uL (ref 0.0–0.1)
Basophils Relative: 0 %
Eosinophils Absolute: 0.4 10*3/uL (ref 0.0–0.5)
Eosinophils Relative: 5 %
HCT: 33.2 % — ABNORMAL LOW (ref 39.0–52.0)
Hemoglobin: 11.5 g/dL — ABNORMAL LOW (ref 13.0–17.0)
Immature Granulocytes: 0 %
Lymphocytes Relative: 11 %
Lymphs Abs: 0.8 10*3/uL (ref 0.7–4.0)
MCH: 31.3 pg (ref 26.0–34.0)
MCHC: 34.6 g/dL (ref 30.0–36.0)
MCV: 90.5 fL (ref 80.0–100.0)
Monocytes Absolute: 0.6 10*3/uL (ref 0.1–1.0)
Monocytes Relative: 9 %
Neutro Abs: 5.4 10*3/uL (ref 1.7–7.7)
Neutrophils Relative %: 75 %
Platelet Count: 181 10*3/uL (ref 150–400)
RBC: 3.67 MIL/uL — ABNORMAL LOW (ref 4.22–5.81)
RDW: 13.4 % (ref 11.5–15.5)
WBC Count: 7.1 10*3/uL (ref 4.0–10.5)
nRBC: 0 % (ref 0.0–0.2)

## 2023-05-24 LAB — CMP (CANCER CENTER ONLY)
ALT: 13 U/L (ref 0–44)
AST: 18 U/L (ref 15–41)
Albumin: 4 g/dL (ref 3.5–5.0)
Alkaline Phosphatase: 56 U/L (ref 38–126)
Anion gap: 4 — ABNORMAL LOW (ref 5–15)
BUN: 12 mg/dL (ref 8–23)
CO2: 30 mmol/L (ref 22–32)
Calcium: 8 mg/dL — ABNORMAL LOW (ref 8.9–10.3)
Chloride: 101 mmol/L (ref 98–111)
Creatinine: 0.66 mg/dL (ref 0.61–1.24)
GFR, Estimated: >60 mL/min (ref >60)
Glucose, Bld: 149 mg/dL — ABNORMAL HIGH (ref 70–99)
Potassium: 4 mmol/L (ref 3.5–5.1)
Sodium: 135 mmol/L (ref 135–145)
Total Bilirubin: 0.3 mg/dL (ref <1.2)
Total Protein: 6.6 g/dL (ref 6.5–8.1)

## 2023-05-24 LAB — LACTATE DEHYDROGENASE: LDH: 202 U/L — ABNORMAL HIGH (ref 98–192)

## 2023-05-24 NOTE — Patient Instructions (Addendum)
There has been no changes to your medications.  Your physician has requested that you have an echocardiogram. Echocardiography is a painless test that uses sound waves to create images of your heart. It provides your doctor with information about the size and shape of your heart and how well your heart's chambers and valves are working. This procedure takes approximately one hour. There are no restrictions for this procedure. Please do NOT wear cologne, perfume, aftershave, or lotions (deodorant is allowed). Please arrive 15 minutes prior to your appointment time.  Please note: We ask at that you not bring children with you during ultrasound (echo/ vascular) testing. Due to room size and safety concerns, children are not allowed in the ultrasound rooms during exams. Our front office staff cannot provide observation of children in our lobby area while testing is being conducted. An adult accompanying a patient to their appointment will only be allowed in the ultrasound room at the discretion of the ultrasound technician under special circumstances. We apologize for any inconvenience.  Your physician recommends that you schedule a follow-up appointment in: 6 months (May 2025) ** PLEASE CALL THE OFFICE IN MARCH TO ARRANGE YOUR FOLLOW UP APPOINTMENT. **  If you have any questions or concerns before your next appointment please send [place] a message through [place] or call our office at [phone].    TO LEAVE A MESSAGE FOR THE NURSE SELECT OPTION 2, PLEASE LEAVE A MESSAGE INCLUDING: YOUR NAME DATE OF BIRTH CALL BACK NUMBER REASON FOR CALL**this is important as we prioritize the call backs  YOU WILL RECEIVE A CALL BACK THE SAME DAY AS LONG AS YOU CALL BEFORE 4:00 PM  At the [facility], you and your health needs are our priority. As part of our continuing mission to provide you with exceptional heart care, we have created designated Provider Care Teams. These Care Teams include your  primary Cardiologist (physician) and Advanced Practice Providers (APPs- Physician Assistants and Nurse Practitioners) who all work together to provide you with the care you need, when you need it.   You may see any of the following providers on your designated Care Team at your next follow up: Dr Arvilla Meres Dr Marca Ancona Dr. Dorthula Nettles Dr. Clearnce Hasten Amy Filbert Schilder, NP Robbie Lis, [place] [facility] [place], [place] Brynda Peon, NP [place] Lee, NP Karle Plumber, PharmD   Please be sure to bring in all your medications bottles to every appointment.    Thank you for choosing [facility]

## 2023-05-24 NOTE — Progress Notes (Signed)
PCP: Rodrigo Ran, MD Pulmonology: Dr. Marchelle Gearing Cardiology: Dr. Shirlee Latch  82 y.o. with history of pulmonary fibrosis, high grade B cell lymphoma, and prostate cancer was referred by Dr. Marchelle Gearing for evaluation of dyspnea and tachycardia.  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  He is on Esbriet.  He has not used home oxygen.  He has baseline exertional dyspnea.  In 5/22, he had XRT for prostate cancer.  In 9/22, he was diagnosed with high grade B cell lymphoma and finished his 6th cycle of mini-CHOP in 4/23.  Pre-op echo in 9/22 showed EF 65-70%, normal RV, unable to estimate PASP, mild AS mean gradient 12 mmHg.  Patient has been noted by CT to have coronary calcification.   After completing CHOP in 4/23, patient developed persistent sinus tachycardia.  He felt palpitations.  D dimer was normal.  No evidence for infection.  Echo in 4/23 showed EF 55-60%, normal RV, unable to assess PA pressure (no TR jet), and nodular calcification of the mitral valve.  Due to mitral valve abnormality, blood cultures were done which were negative.  Sinus tachycardia eventually subsided on its own.   There was talk over doing CTA to further investigate etiology of dyspnea. Felt arranging LHC at time of RHC would make most sense.   Underwent [facility] 10/23 showing mild LAD lesion 40% stenosed, normal filling pressures, normal PA pressure, normal CO.  S/p cervical spinal decompression & fusion 08/11/22. Seen in ED 08/23/22 with palpitations. 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 Zio monitor in 3/24 showed 16% PVCs.  Toprol XL increased to 25 mg bid.    Patient returns for followup of dyspnea/PVCs.  He is now in an ILD study and getting Tyvaso vs placebo.  He says that he feels like his breathing is improved since he started the study drug.  Short of breath with hills and inclines, no dyspnea on flat ground.  No  chest pain.  He is not on home oxygen.  No orthopnea/PND.  No lightheadedness.  He feels palpitations occasionally.   ECG (personally reviewed): NSR with PVCs  Labs (4/23): D dimer negative, BNP 137, HS-TnI 13, K 4.1, creatinine 0.59, hgb 10.2, WBCs 12 Labs (5/23): K 4.4, creatinine 0.64, hgb 11.3 Labs (2/24): TSH and LFTs normal, K 4.0, creatinine 0.59, hgb 11.3, HS-Tnl 7>>7. Labs (4/24): K 3.9, creatinine 0.66 Labs (5/24): LDL 68 Labs (11/24): K 4, creatinine 0.66  PMH: 1. IBS 2. 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  - CT chest in 9/22 with pulmonary fibrosis, enlarged PA, coronary artery calcium.  3. CAD: Coronary calcification on 9/22 CT chest.  - Cardiolite in 9/18 showed no ischemia or infarction.  - LHC (10/23): mild nonobstructive CAD with 40% mid LAD lesion 4. Hyperlipidemia 5. High grade B cell lymphoma: Diagnosed in 9/22.  He received 6 cycles of mini-CHOP.  - Echo (9/22): Prechemo, EF 65-70%, normal RV, unable to estimate PASP, mild AS mean gradient 12 mmHg.  - Echo (4/23): EF 55-60%, normal RV, unable to assess PA pressure (no TR jet), and nodular calcification of the mitral valve.  - RHC (10/23): RA 2, PA 24/5 (mean 14), PCWP 5, CO/CI 6.26/3.29 - Echo (3/24): EF 55-60%, normal RV, mild-moderate AS with mean gradient 11 and AVA 1.52 cm^2,  6. Aortic stenosis: Mild-moderate on 3/24 echo.  7. 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  8. H/o L-spine compression fracture.  9.  Sleep study 5/24 with mild OSA.  10. PVCs: Zio monitor (3/24) with 16% PVCs  SH: Married, lives in [place], retired Marine scientist, nonsmoker, rare ETOH.   Family History  Problem Relation Age of Onset   Heart failure Father 82   Bladder Cancer Mother    Breast cancer Mother    Colon cancer Neg Hx    Pancreatic cancer Neg Hx    Rectal cancer Neg Hx    Stomach cancer Neg Hx    Prostate cancer Neg Hx    ROS: All systems reviewed and negative except as per HPI.   Current Outpatient Medications   Medication Sig Dispense Refill   acetaminophen (TYLENOL) 500 MG tablet Take 1,000 mg by mouth every 6 (six) hours as needed for moderate pain.     bismuth subsalicylate (PEPTO BISMOL) 262 MG/15ML suspension Take 30 mLs by mouth every 6 (six) hours as needed for diarrhea or loose stools or indigestion.     carboxymethylcellulose (REFRESH TEARS) 0.5 % SOLN Place 1 drop into both eyes daily as needed (dry eyes).     carisoprodol (SOMA) 250 MG tablet Take 350 mg by mouth 4 (four) times daily as needed. Takes 1/2 tablet each dose     Cholecalciferol (VITAMIN D-3) 1000 UNITS CAPS Take 2,000 Units by mouth every evening.     CO-ENZYME Q-10 PO Take 1 tablet by mouth every evening.      Cyanocobalamin (VITAMIN B12) 1000 MCG TBCR Take 1,000 mcg by mouth daily.     denosumab (PROLIA) 60 MG/ML SOSY injection Inject 60 mg into the skin every 6 (six) months. At [facility]     desvenlafaxine (PRISTIQ) 50 MG 24 hr tablet Take 50 mg by mouth daily.     diclofenac (VOLTAREN) 75 MG EC tablet Take 75 mg by mouth 2 (two) times daily as needed (pain.).  2   diclofenac sodium (VOLTAREN) 1 % GEL Apply 4 g topically 4 (four) times daily as needed (pain.).  12   dicyclomine (BENTYL) 10 MG capsule Take one by mouth every 6 hours as needed for abdominal cramping 30 capsule 1   diphenoxylate-atropine (LOMOTIL) 2.5-0.025 MG tablet Take 1 tablet by mouth 4 (four) times daily as needed for diarrhea or loose stools.     Emollient (AQUAPHOR ADV PROTECT HEALING EX) Apply 1 Application topically daily as needed (itching).     Evolocumab (REPATHA [place]) Inject 140 mg into the skin every 30 (thirty) days.     Fluticasone Propionate (XHANCE) 93 MCG/ACT EXHU Place 1 spray into both nostrils 2 (two) times daily as needed (sinus).     Glucosamine Sulfate 500 MG TABS Take 500 mg by mouth daily.     HYDROcodone-acetaminophen (NORCO/VICODIN) 5-325 MG tablet Take 1 tablet by mouth every 4 (four) hours as needed for moderate pain  ((score 4 to 6)). 30 tablet 0   latanoprost (XALATAN) 0.005 % ophthalmic solution Place 1 drop into both eyes at bedtime.     loratadine (CLARITIN) 10 MG tablet Take 10 mg by mouth daily.     meclizine (ANTIVERT) 25 MG tablet Take 25 mg by mouth 3 (three) times daily as needed for dizziness.     methocarbamol (ROBAXIN) 500 MG tablet Take 500 mg by mouth every 8 (eight) hours as needed (severe muscle spasms).     metoprolol succinate (TOPROL XL) 25 MG 24 hr tablet Take 1 tablet (25 mg total) by mouth 2 (two) times daily. 60 tablet 11   Multiple Vitamin (MULTIVITAMIN)  tablet Take 1 tablet by mouth daily.     omeprazole (PRILOSEC) 40 MG capsule TAKE 1 CAPSULE (40 MG TOTAL) BY MOUTH DAILY. 30 capsule 0   ondansetron (ZOFRAN) 8 MG tablet TAKE 1 TABLET BY MOUTH EVERY 8 HOURS AS NEEDED FOR NAUSEA AND VOMITING 30 tablet 1   Pirfenidone (ESBRIET) 801 MG TABS Take 1 tablet (801 mg total) by mouth 3 (three) times daily with meals. 270 tablet 1   Probiotic Product (ALIGN) 4 MG CAPS Take 4 mg by mouth daily.     No current facility-administered medications for this encounter.   Wt Readings from Last 3 Encounters:  05/24/23 76.7 kg (169 lb)  05/24/23 76.8 kg (169 lb 4.8 oz)  05/13/23 76.7 kg (169 lb)   BP (!) 120/58   Pulse 84   Wt 76.7 kg (169 lb)   SpO2 96%   BMI 25.70 kg/m  General: NAD Neck: No JVD, no thyromegaly or thyroid nodule.  Lungs: Dry crackles at bases.  CV: Nondisplaced PMI.  Heart regular S1/S2, no S3/S4, 2/6 early SEM RUSB.  No peripheral edema.  No carotid bruit.  Normal pedal pulses.  Abdomen: Soft, nontender, no hepatosplenomegaly, no distention.  Skin: Intact without lesions or rashes.  Neurologic: Alert and oriented x 3.  Psych: Normal affect. Extremities: No clubbing or cyanosis.  HEENT: Normal.   Assessment/Plan: 1. PVCs: Zio monitor in 3/24 with 16% PVCs.  Toprol XL was increased.  Echo in 3/24 showed normal EF.  Cath in 10/23 showed nonobstructive CAD.  He rarely  feels palpitations, PVCs noted on ECG today.  - Continue Toprol XL 25 mg bid.  - Mild OSA on sleep study, he does not want to use CPAP.  2. Exertional dyspnea: Suspect this is primarily due to IPF.  Echo in 3/24 showed EF 55-60%, normal RV, mild-moderate AS with mean gradient 11 and AVA 1.52 cm^2.  RHC (10/23) with normal filling pressures, normal PA pressure and normal CO. No indication for Tyvaso based on RHC, but he is in a clinical trial of Tyvaso vs placebo in IPF without PH. He is not volume overloaded on exam.  - Suspect progressive IPF is the main contributor to dyspnea.  4. IPF: Long-standing, has been on Esbriet.  5. CAD: Coronary calcification noted. LHC (10/23) with mild, non-obstructive CAD; mild LAD 40% stenosed. No chest pain. - Continue ASA 81.  - Continue Repatha. Good lipids in 5/24.  6. Aortic stenosis: Mild to moderate on 3/24 echo.   - will repeat echo in 3/25 to follow AS.   Follow up 6 months.   Marca Ancona  05/24/2023

## 2023-05-24 NOTE — Progress Notes (Signed)
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  He has chronic discomfort at the lower back, improved following an ablation procedure several weeks ago.  He is followed by Dr. Marchelle [name] for pulmonary fibrosis and is now being treated on a clinical trial.  No fever or night sweats.  No palpable lymph nodes.  Objective:  Vital signs in last 24 hours:  Blood pressure (!) 120/58, pulse 77, temperature 98.1 F (36.7 C), temperature source Temporal, resp. rate 18, height 5\' 8"  (1.727 m), weight 169 lb 4.8 oz (76.8 kg), SpO2 97%.    Lymphatics: No cervical, supraclavicular, axillary, or inguinal nodes Resp: Inspiratory rales at the lower posterior chest on the right greater than left, no respiratory distress Cardio: Irregular, premature beats GI: No hepatosplenomegaly Vascular: No leg edema  Lab Results:  Lab Results  Component Value Date   WBC 7.1 05/24/2023   HGB 11.5 (L) 05/24/2023   HCT 33.2 (L) 05/24/2023   MCV 90.5 05/24/2023   PLT 181 05/24/2023   NEUTROABS 5.4 05/24/2023    CMP  Lab Results  Component Value Date   NA 135 01/12/2023   K 4.1 01/12/2023   CL 101 01/12/2023   CO2 30 01/12/2023   GLUCOSE 158 (H) 01/12/2023   BUN 12 01/12/2023   CREATININE 0.69 01/12/2023   CALCIUM 9.0 01/12/2023   PROT 6.6 01/12/2023   ALBUMIN 4.2 01/12/2023   AST 19 01/12/2023   ALT 12 01/12/2023   ALKPHOS 44 01/12/2023   BILITOT 0.5 01/12/2023   GFRNONAA >60 01/12/2023   GFRAA >60 06/01/2017    No results found for: "CEA1", "CEA", "WUJ811", "CA125"  No results found for: "INR", "LABPROT"  Imaging:  No results found.  Medications: I have reviewed the patient's current medications.   Assessment/Plan: Non-Hodgkin's lymphoma CTs neck and chest 03/19/2021-cluster of left supraclavicular nodes measuring less than 1 cm, 12 mm right node next to the jugular vein, chest with slight  interval progression of pulmonary fibrosis, new small sclerotic lesion in the right third rib concerning for metastasis, lucent lesion in T8 corresponding to abnormal uptake on a bone scan 05/21/2020 suspicious for malignancy, subpectoral nodes-subcentimeter in size but larger than 03/28/2019 FNA of left neck node 03/25/2021-atypical lymphoid proliferation Excisional biopsy of level 5 left neck node 04/07/2021-lymph node with marked autolysis and diffusely infiltrated by atypical lymphocytes, 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 Cycle 1 R-mini CHOP 07/10/2021 Cycle 2 R-mini CHOP 07/31/2021 Cycle 3 R-mini CHOP 08/21/2021 Cycle 4 R-mini CHOP 09/11/2021 PET 09/29/2021-near complete resolution of hypermetabolic lymph nodes, no remaining hypermetabolic disease, wall thickening and hypermetabolic activity at the transverse and sigmoid  colon Cycle 5 R-mini CHOP 10/02/2021 Cycle 6 R-mini CHOP  10/23/2021, Udenyca 10/24/2021 Prostate cancer-T3 adenocarcinoma, Gleason 5+4, external beam XRT 09/12/2020 - 11/07/2020, maintained on androgen deprivation therapy 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  Anemia-chronic 7.  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  He has chronic mild anemia, likely secondary to the course of chemotherapy and chronic disease.  He will call for new symptoms.  He will return for an office and lab visit in 6 months.  Thornton Papas, MD  05/24/2023  8:13 AM

## 2023-05-25 ENCOUNTER — Ambulatory Visit: Payer: PPO | Admitting: Oncology

## 2023-05-25 ENCOUNTER — Other Ambulatory Visit: Payer: PPO

## 2023-05-26 NOTE — Telephone Encounter (Signed)
Telephone call  

## 2023-05-27 ENCOUNTER — Encounter: Payer: PPO | Admitting: *Deleted

## 2023-05-27 DIAGNOSIS — Z006 Encounter for examination for normal comparison and control in clinical research program: Secondary | ICD-10-CM

## 2023-05-27 DIAGNOSIS — J84112 Idiopathic pulmonary fibrosis: Secondary | ICD-10-CM

## 2023-05-27 NOTE — Research (Cosign Needed)
Title:A Randomized, Double-blind, Placebo-controlled, Phase 3 Study of the Efficacy and Safety of Inhaled Treprostinil in Subjects with Idiopathic Pulmonary Fibrosis   Dose and Duration of Treatment:Treprostinil for Inhalation 0.6 mg/mL, or placebo (randomly assigned 1:1) over a 52 week period.  Protocol # RIN-PF-301; IND # [id number]; Clinical Trials.gov Identifier: [id number]  Sponsor: [employer]Research [place], [place] [address]  [facility] Clinical Research Coordinator note:   This visit for [name] with DOB: 11/08/1940 on 05/27/2023 for the above protocol is Visit/Encounter # Week 4  and is for purpose of research.   The consent for this encounter is under Protocol Version : Amendment 4 dated: 26Jul2023 IB: Version 19 dated 08Jul2024 ICF: Ver. 3.0, 05 Feb 2022, WCG Ver. 3.1, 02 Apr 2022, IRB APPROVED AS MODIFIED Apr 07, 2022 Lab Manual: V4 dated 07Jun2023  is currently IRB approved.   [name]  (Subject/LAR) expressed continued interest and consent in continuing as a study subject. Subject confirmed that there was  No  (NO/YES) change in contact information (e.g. address, telephone, email). Subject thanked for participation in research and contribution to science.  The Subject was informed that the PI Kalman Shan continues to have oversight of the subject's visits and course through relevant discussions, reviews, and also specifically of this visit by routing of this note to the PI.  All assessments were done as per the schedule. Further details in the binder.     Signed by  Neita Garnet  Clinical Research Coordinator  [facility]  [place], [place] 10:00 AM 05/27/2023

## 2023-05-28 ENCOUNTER — Other Ambulatory Visit: Payer: Self-pay | Admitting: Internal Medicine

## 2023-05-28 DIAGNOSIS — M8588 Other specified disorders of bone density and structure, other site: Secondary | ICD-10-CM | POA: Diagnosis not present

## 2023-06-01 ENCOUNTER — Other Ambulatory Visit: Payer: Self-pay | Admitting: Oncology

## 2023-06-10 LAB — PULMONARY FUNCTION TEST
DL/VA % pred: 81 %
DL/VA: 3.19 ml/min/mmHg/L
DLCO cor % pred: 51 %
DLCO cor: 11.71 ml/min/mmHg
DLCO unc % pred: 51 %
DLCO unc: 11.71 ml/min/mmHg
FEF 25-75 Pre: 2.81 L/s
FEF2575-%Pred-Pre: 161 %
FEV1-%Pred-Pre: 78 %
FEV1-Pre: 2.03 L
FEV1FVC-%Pred-Pre: 119 %
FEV6-%Pred-Pre: 70 %
FEV6-Pre: 2.37 L
FEV6FVC-%Pred-Pre: 107 %
FVC-%Pred-Pre: 64 %
FVC-Pre: 2.37 L
Pre FEV1/FVC ratio: 86 %
Pre FEV6/FVC Ratio: 100 %

## 2023-06-21 ENCOUNTER — Telehealth: Payer: Self-pay

## 2023-06-21 NOTE — Telephone Encounter (Signed)
PA renewal initiated automatically by CoverMyMeds.  Attempted to submit a Prior Authorization request to [facility] ADVANTAGE/RX ADVANCE for PIRFENIDONE via CoverMyMeds, however request was cancelled due to being a duplicate request/previously approved.   Key: [id number]

## 2023-06-23 DIAGNOSIS — M47816 Spondylosis without myelopathy or radiculopathy, lumbar region: Secondary | ICD-10-CM | POA: Diagnosis not present

## 2023-06-25 ENCOUNTER — Encounter: Payer: PPO | Admitting: Internal Medicine

## 2023-06-25 DIAGNOSIS — J84112 Idiopathic pulmonary fibrosis: Secondary | ICD-10-CM

## 2023-06-25 NOTE — Research (Cosign Needed)
Title:A Randomized, Double-blind, Placebo-controlled, Phase 3 Study of the Efficacy and Safety of Inhaled Treprostinil in Subjects with Idiopathic Pulmonary Fibrosis   Dose and Duration of Treatment:Treprostinil for Inhalation 0.6 mg/mL, or placebo (randomly assigned 1:1) over a 52 week period.  Protocol # RIN-PF-301; IND # [id number]; Clinical Trials.gov Identifier: [id number] [facility] [place] Clinical Research Coordinator note:   This visit for [name] with DOB: [birth year] on 06/25/2023 for the above protocol is Visit/Encounter # Week 8  and is for purpose of research.   The consent for this encounter is under Protocol Version : Amendment 4 dated: 26Jul2023 IB: Version 19 dated 08Jul2024 ICF: Ver. 3.0, 05 Feb 2022, WCG Ver. 3.1, 02 Apr 2022, IRB APPROVED AS MODIFIED Apr 07, 2022 Lab Manual: V4 dated 07Jun2023 is currently IRB approved.   Kenneth [name]  (Subject/LAR) expressed continued interest and consent in continuing as a study subject. Subject confirmed that there was  No  (NO/YES) change in contact information (e.g. address, telephone, email). Subject thanked for participation in research and contribution to science. All assessments were done as per the protocol. Further details in the binder.   The Subject was informed that the PI Murali Ramaswamy,MD, continues to have oversight of the subject's visits and course through relevant discussions, reviews, and also specifically of this visit by routing of this note to the PI.    Signed by  Neita Garnet  Clinical Research Coordinator  [facility]  [place], [place] 10:19 AM 06/25/2023

## 2023-07-13 DIAGNOSIS — C61 Malignant neoplasm of prostate: Secondary | ICD-10-CM | POA: Diagnosis not present

## 2023-07-20 DIAGNOSIS — R3915 Urgency of urination: Secondary | ICD-10-CM | POA: Diagnosis not present

## 2023-07-20 DIAGNOSIS — C61 Malignant neoplasm of prostate: Secondary | ICD-10-CM | POA: Diagnosis not present

## 2023-08-22 ENCOUNTER — Inpatient Hospital Stay (HOSPITAL_BASED_OUTPATIENT_CLINIC_OR_DEPARTMENT_OTHER)
Admission: EM | Admit: 2023-08-22 | Discharge: 2023-08-26 | DRG: 871 | Disposition: A | Discharge status: Home / Self Care | Payer: PPO | Attending: Family Medicine | Admitting: Family Medicine

## 2023-08-22 ENCOUNTER — Encounter (HOSPITAL_BASED_OUTPATIENT_CLINIC_OR_DEPARTMENT_OTHER): Payer: Self-pay | Admitting: Emergency Medicine

## 2023-08-22 ENCOUNTER — Emergency Department (HOSPITAL_BASED_OUTPATIENT_CLINIC_OR_DEPARTMENT_OTHER): Payer: PPO

## 2023-08-22 DIAGNOSIS — Z79899 Other long term (current) drug therapy: Secondary | ICD-10-CM | POA: Diagnosis not present

## 2023-08-22 DIAGNOSIS — Z8052 Family history of malignant neoplasm of bladder: Secondary | ICD-10-CM

## 2023-08-22 DIAGNOSIS — J8410 Pulmonary fibrosis, unspecified: Secondary | ICD-10-CM | POA: Diagnosis not present

## 2023-08-22 DIAGNOSIS — M159 Polyosteoarthritis, unspecified: Secondary | ICD-10-CM | POA: Diagnosis not present

## 2023-08-22 DIAGNOSIS — E785 Hyperlipidemia, unspecified: Secondary | ICD-10-CM | POA: Diagnosis not present

## 2023-08-22 DIAGNOSIS — I700 Atherosclerosis of aorta: Secondary | ICD-10-CM | POA: Diagnosis not present

## 2023-08-22 DIAGNOSIS — J849 Interstitial pulmonary disease, unspecified: Secondary | ICD-10-CM

## 2023-08-22 DIAGNOSIS — Z803 Family history of malignant neoplasm of breast: Secondary | ICD-10-CM

## 2023-08-22 DIAGNOSIS — E875 Hyperkalemia: Secondary | ICD-10-CM | POA: Diagnosis not present

## 2023-08-22 DIAGNOSIS — R0602 Shortness of breath: Secondary | ICD-10-CM | POA: Diagnosis not present

## 2023-08-22 DIAGNOSIS — K219 Gastro-esophageal reflux disease without esophagitis: Secondary | ICD-10-CM | POA: Diagnosis not present

## 2023-08-22 DIAGNOSIS — F32A Depression, unspecified: Secondary | ICD-10-CM | POA: Diagnosis not present

## 2023-08-22 DIAGNOSIS — R918 Other nonspecific abnormal finding of lung field: Secondary | ICD-10-CM | POA: Diagnosis not present

## 2023-08-22 DIAGNOSIS — Z8616 Personal history of COVID-19: Secondary | ICD-10-CM | POA: Diagnosis not present

## 2023-08-22 DIAGNOSIS — Z8249 Family history of ischemic heart disease and other diseases of the circulatory system: Secondary | ICD-10-CM | POA: Diagnosis not present

## 2023-08-22 DIAGNOSIS — U071 COVID-19: Principal | ICD-10-CM | POA: Diagnosis present

## 2023-08-22 DIAGNOSIS — C8510 Unspecified B-cell lymphoma, unspecified site: Secondary | ICD-10-CM | POA: Diagnosis not present

## 2023-08-22 DIAGNOSIS — J9601 Acute respiratory failure with hypoxia: Secondary | ICD-10-CM | POA: Diagnosis present

## 2023-08-22 DIAGNOSIS — A4189 Other specified sepsis: Secondary | ICD-10-CM | POA: Diagnosis not present

## 2023-08-22 DIAGNOSIS — I5033 Acute on chronic diastolic (congestive) heart failure: Secondary | ICD-10-CM | POA: Diagnosis present

## 2023-08-22 DIAGNOSIS — E871 Hypo-osmolality and hyponatremia: Secondary | ICD-10-CM | POA: Diagnosis not present

## 2023-08-22 DIAGNOSIS — E872 Acidosis, unspecified: Secondary | ICD-10-CM | POA: Diagnosis present

## 2023-08-22 DIAGNOSIS — R6520 Severe sepsis without septic shock: Secondary | ICD-10-CM | POA: Diagnosis not present

## 2023-08-22 DIAGNOSIS — I509 Heart failure, unspecified: Secondary | ICD-10-CM | POA: Diagnosis not present

## 2023-08-22 DIAGNOSIS — J9600 Acute respiratory failure, unspecified whether with hypoxia or hypercapnia: Secondary | ICD-10-CM

## 2023-08-22 DIAGNOSIS — Z8546 Personal history of malignant neoplasm of prostate: Secondary | ICD-10-CM | POA: Diagnosis not present

## 2023-08-22 LAB — I-STAT VENOUS BLOOD GAS, ED
Acid-Base Excess: 0 mmol/L (ref 0.0–2.0)
Bicarbonate: 23.6 mmol/L (ref 20.0–28.0)
Calcium, Ion: 1.05 mmol/L — ABNORMAL LOW (ref 1.15–1.40)
HCT: 33 % — ABNORMAL LOW (ref 39.0–52.0)
Hemoglobin: 11.2 g/dL — ABNORMAL LOW (ref 13.0–17.0)
O2 Saturation: 97 %
Potassium: 3.7 mmol/L (ref 3.5–5.1)
Sodium: 124 mmol/L — ABNORMAL LOW (ref 135–145)
TCO2: 25 mmol/L (ref 22–32)
pCO2, Ven: 35.1 mm[Hg] — ABNORMAL LOW (ref 44–60)
pH, Ven: 7.435 — ABNORMAL HIGH (ref 7.25–7.43)
pO2, Ven: 82 mm[Hg] — ABNORMAL HIGH (ref 32–45)

## 2023-08-22 LAB — URINALYSIS, ROUTINE W REFLEX MICROSCOPIC
Bacteria, UA: NONE SEEN
Bilirubin Urine: NEGATIVE
Glucose, UA: NEGATIVE mg/dL
Ketones, ur: NEGATIVE mg/dL
Leukocytes,Ua: NEGATIVE
Nitrite: NEGATIVE
Protein, ur: NEGATIVE mg/dL
Specific Gravity, Urine: 1.004 — ABNORMAL LOW (ref 1.005–1.030)
pH: 7 (ref 5.0–8.0)

## 2023-08-22 LAB — CBC WITH DIFFERENTIAL/PLATELET
Abs Immature Granulocytes: 0.03 10*3/uL (ref 0.00–0.07)
Basophils Absolute: 0 10*3/uL (ref 0.0–0.1)
Basophils Relative: 0 %
Eosinophils Absolute: 0 10*3/uL (ref 0.0–0.5)
Eosinophils Relative: 0 %
HCT: 30.4 % — ABNORMAL LOW (ref 39.0–52.0)
Hemoglobin: 10.7 g/dL — ABNORMAL LOW (ref 13.0–17.0)
Immature Granulocytes: 0 %
Lymphocytes Relative: 2 %
Lymphs Abs: 0.2 10*3/uL — ABNORMAL LOW (ref 0.7–4.0)
MCH: 30.5 pg (ref 26.0–34.0)
MCHC: 35.2 g/dL (ref 30.0–36.0)
MCV: 86.6 fL (ref 80.0–100.0)
Monocytes Absolute: 0.6 10*3/uL (ref 0.1–1.0)
Monocytes Relative: 8 %
Neutro Abs: 6.8 10*3/uL (ref 1.7–7.7)
Neutrophils Relative %: 90 %
Platelets: 158 10*3/uL (ref 150–400)
RBC: 3.51 MIL/uL — ABNORMAL LOW (ref 4.22–5.81)
RDW: 13.7 % (ref 11.5–15.5)
WBC: 7.6 10*3/uL (ref 4.0–10.5)
nRBC: 0 % (ref 0.0–0.2)

## 2023-08-22 LAB — BASIC METABOLIC PANEL
Anion gap: 8 (ref 5–15)
BUN: 7 mg/dL — ABNORMAL LOW (ref 8–23)
CO2: 25 mmol/L (ref 22–32)
Calcium: 8.1 mg/dL — ABNORMAL LOW (ref 8.9–10.3)
Chloride: 95 mmol/L — ABNORMAL LOW (ref 98–111)
Creatinine, Ser: 0.6 mg/dL — ABNORMAL LOW (ref 0.61–1.24)
GFR, Estimated: >60 mL/min (ref >60)
Glucose, Bld: 183 mg/dL — ABNORMAL HIGH (ref 70–99)
Potassium: 4.4 mmol/L (ref 3.5–5.1)
Sodium: 128 mmol/L — ABNORMAL LOW (ref 135–145)

## 2023-08-22 LAB — COMPREHENSIVE METABOLIC PANEL
ALT: 13 U/L (ref 0–44)
AST: 23 U/L (ref 15–41)
Albumin: 3.6 g/dL (ref 3.5–5.0)
Alkaline Phosphatase: 38 U/L (ref 38–126)
Anion gap: 6 (ref 5–15)
BUN: 7 mg/dL — ABNORMAL LOW (ref 8–23)
CO2: 26 mmol/L (ref 22–32)
Calcium: 8 mg/dL — ABNORMAL LOW (ref 8.9–10.3)
Chloride: 90 mmol/L — ABNORMAL LOW (ref 98–111)
Creatinine, Ser: 0.56 mg/dL — ABNORMAL LOW (ref 0.61–1.24)
GFR, Estimated: >60 mL/min (ref >60)
Glucose, Bld: 183 mg/dL — ABNORMAL HIGH (ref 70–99)
Potassium: 3.7 mmol/L (ref 3.5–5.1)
Sodium: 122 mmol/L — ABNORMAL LOW (ref 135–145)
Total Bilirubin: 0.6 mg/dL (ref 0.0–1.2)
Total Protein: 6.4 g/dL — ABNORMAL LOW (ref 6.5–8.1)

## 2023-08-22 LAB — SODIUM, URINE, RANDOM: Sodium, Ur: 32 mmol/L

## 2023-08-22 LAB — LACTIC ACID, PLASMA
Lactic Acid, Venous: 1.6 mmol/L (ref 0.5–1.9)
Lactic Acid, Venous: 2.2 mmol/L (ref 0.5–1.9)
Lactic Acid, Venous: 2.5 mmol/L (ref 0.5–1.9)

## 2023-08-22 LAB — D-DIMER, QUANTITATIVE: D-Dimer, Quant: 0.82 ug{FEU}/mL — ABNORMAL HIGH (ref 0.00–0.50)

## 2023-08-22 LAB — OSMOLALITY, URINE: Osmolality, Ur: 108 mosm/kg — ABNORMAL LOW (ref 300–900)

## 2023-08-22 LAB — BRAIN NATRIURETIC PEPTIDE: B Natriuretic Peptide: 581.7 pg/mL — ABNORMAL HIGH (ref 0.0–100.0)

## 2023-08-22 LAB — APTT: aPTT: 29 s (ref 24–36)

## 2023-08-22 LAB — RESP PANEL BY RT-PCR (RSV, FLU A&B, COVID)  RVPGX2
Influenza A by PCR: NEGATIVE
Influenza B by PCR: NEGATIVE
Resp Syncytial Virus by PCR: NEGATIVE
SARS Coronavirus 2 by RT PCR: POSITIVE — AB

## 2023-08-22 LAB — SEDIMENTATION RATE: Sed Rate: 19 mm/h — ABNORMAL HIGH (ref 0–16)

## 2023-08-22 LAB — PROTIME-INR
INR: 1.2 (ref 0.8–1.2)
Prothrombin Time: 15.1 s (ref 11.4–15.2)

## 2023-08-22 LAB — OSMOLALITY: Osmolality: 280 mosm/kg (ref 275–295)

## 2023-08-22 LAB — MRSA NEXT GEN BY PCR, NASAL: MRSA by PCR Next Gen: NOT DETECTED

## 2023-08-22 LAB — PROCALCITONIN: Procalcitonin: 0.12 ng/mL

## 2023-08-22 LAB — C-REACTIVE PROTEIN: CRP: 13.1 mg/dL — ABNORMAL HIGH (ref <1.0)

## 2023-08-22 MED ORDER — ONDANSETRON HCL 4 MG/2ML IJ SOLN: 4.0000 mg | Freq: Four times a day (QID) | INTRAMUSCULAR | Status: DC | PRN

## 2023-08-22 MED ORDER — ACETAMINOPHEN 325 MG PO TABS
650.0000 mg | ORAL_TABLET | Freq: Four times a day (QID) | ORAL | Status: DC | PRN
  Administered 2023-08-22: 650 mg via ORAL
  Filled 2023-08-22 (×2): qty 2

## 2023-08-22 MED ORDER — IPRATROPIUM-ALBUTEROL 0.5-2.5 (3) MG/3ML IN SOLN
6.0000 mL | Freq: Once | RESPIRATORY_TRACT | Status: AC
  Administered 2023-08-22: 6 mL via RESPIRATORY_TRACT
  Filled 2023-08-22: qty 6

## 2023-08-22 MED ORDER — VENLAFAXINE HCL ER 75 MG PO CP24
75.0000 mg | ORAL_CAPSULE | Freq: Every day | ORAL | Status: DC
  Filled 2023-08-22 (×4): qty 1

## 2023-08-22 MED ORDER — SODIUM CHLORIDE 0.9 % IV SOLN
INTRAVENOUS | Status: DC
Start: 2023-08-22 — End: 2023-08-22

## 2023-08-22 MED ORDER — POLYVINYL ALCOHOL 1.4 % OP SOLN: 1.0000 [drp] | OPHTHALMIC | Status: DC | PRN

## 2023-08-22 MED ORDER — ASPIRIN 81 MG PO TBEC
81.0000 mg | DELAYED_RELEASE_TABLET | Freq: Every day | ORAL | Status: DC
  Administered 2023-08-25 – 2023-08-26 (×2): 81 mg via ORAL
  Filled 2023-08-22 (×3): qty 1

## 2023-08-22 MED ORDER — LORATADINE 10 MG PO TABS
10.0000 mg | ORAL_TABLET | Freq: Every day | ORAL | Status: DC
  Administered 2023-08-22 – 2023-08-23 (×2): 10 mg via ORAL
  Filled 2023-08-22 (×5): qty 1

## 2023-08-22 MED ORDER — ENOXAPARIN SODIUM 40 MG/0.4ML IJ SOSY
40.0000 mg | PREFILLED_SYRINGE | INTRAMUSCULAR | Status: DC
  Administered 2023-08-22 – 2023-08-25 (×4): 40 mg via SUBCUTANEOUS
  Filled 2023-08-22 (×4): qty 0.4

## 2023-08-22 MED ORDER — CHLORHEXIDINE GLUCONATE CLOTH 2 % EX PADS
6.0000 | MEDICATED_PAD | Freq: Every day | CUTANEOUS | Status: DC
  Administered 2023-08-22 – 2023-08-23 (×2): 6 via TOPICAL

## 2023-08-22 MED ORDER — ACETAMINOPHEN 650 MG RE SUPP: 650.0000 mg | Freq: Four times a day (QID) | RECTAL | Status: DC | PRN

## 2023-08-22 MED ORDER — VITAMIN D 25 MCG (1000 UNIT) PO TABS
2000.0000 [IU] | ORAL_TABLET | Freq: Every evening | ORAL | Status: DC
  Administered 2023-08-22: 2000 [IU] via ORAL
  Filled 2023-08-22 (×3): qty 2

## 2023-08-22 MED ORDER — SODIUM CHLORIDE 0.9 % IV SOLN
100.0000 mg | Freq: Every day | INTRAVENOUS | Status: DC
  Filled 2023-08-22: qty 20

## 2023-08-22 MED ORDER — CARBOXYMETHYLCELLULOSE SODIUM 0.5 % OP SOLN: 1.0000 [drp] | Freq: Every day | OPHTHALMIC | Status: DC | PRN

## 2023-08-22 MED ORDER — IPRATROPIUM-ALBUTEROL 0.5-2.5 (3) MG/3ML IN SOLN: 3.0000 mL | Freq: Once | RESPIRATORY_TRACT | Status: DC

## 2023-08-22 MED ORDER — PIRFENIDONE 801 MG PO TABS
1.0000 | ORAL_TABLET | Freq: Three times a day (TID) | ORAL | Status: DC
  Administered 2023-08-23 – 2023-08-25 (×7): 801 mg via ORAL

## 2023-08-22 MED ORDER — ORAL CARE MOUTH RINSE: 15.0000 mL | OROMUCOSAL | Status: DC | PRN

## 2023-08-22 MED ORDER — HYDRALAZINE HCL 20 MG/ML IJ SOLN: 10.0000 mg | Freq: Four times a day (QID) | INTRAMUSCULAR | Status: DC | PRN

## 2023-08-22 MED ORDER — DICYCLOMINE HCL 10 MG PO CAPS
10.0000 mg | ORAL_CAPSULE | Freq: Four times a day (QID) | ORAL | Status: DC | PRN
  Filled 2023-08-22: qty 1

## 2023-08-22 MED ORDER — METOPROLOL SUCCINATE ER 25 MG PO TB24
25.0000 mg | ORAL_TABLET | Freq: Two times a day (BID) | ORAL | Status: DC
  Administered 2023-08-24 – 2023-08-25 (×3): 25 mg via ORAL
  Filled 2023-08-22 (×6): qty 1

## 2023-08-22 MED ORDER — SODIUM CHLORIDE 0.9 % IV SOLN
200.0000 mg | Freq: Once | INTRAVENOUS | Status: AC
  Administered 2023-08-22: 200 mg via INTRAVENOUS
  Filled 2023-08-22: qty 40

## 2023-08-22 MED ORDER — ONDANSETRON HCL 4 MG PO TABS: 4.0000 mg | ORAL_TABLET | Freq: Four times a day (QID) | ORAL | Status: DC | PRN

## 2023-08-22 MED ORDER — ALBUTEROL SULFATE (2.5 MG/3ML) 0.083% IN NEBU: 2.5000 mg | INHALATION_SOLUTION | RESPIRATORY_TRACT | Status: DC | PRN

## 2023-08-22 MED ORDER — LATANOPROST 0.005 % OP SOLN
1.0000 [drp] | Freq: Every day | OPHTHALMIC | Status: DC
  Filled 2023-08-22: qty 2.5

## 2023-08-22 MED ORDER — CYCLOBENZAPRINE HCL 5 MG PO TABS
5.0000 mg | ORAL_TABLET | Freq: Three times a day (TID) | ORAL | Status: DC | PRN
  Administered 2023-08-22 – 2023-08-24 (×3): 5 mg via ORAL
  Filled 2023-08-22 (×4): qty 1

## 2023-08-22 MED ORDER — DEXAMETHASONE SODIUM PHOSPHATE 10 MG/ML IJ SOLN
6.0000 mg | INTRAMUSCULAR | Status: DC
  Administered 2023-08-22 – 2023-08-26 (×5): 6 mg via INTRAVENOUS
  Filled 2023-08-22 (×5): qty 1

## 2023-08-22 MED ORDER — GLUCOSAMINE SULFATE 500 MG PO TABS
500.0000 mg | ORAL_TABLET | Freq: Every day | ORAL | Status: DC
Start: 2023-08-22 — End: 2023-08-22

## 2023-08-22 MED ORDER — SODIUM CHLORIDE 0.9% FLUSH
3.0000 mL | Freq: Two times a day (BID) | INTRAVENOUS | Status: DC
  Administered 2023-08-22 – 2023-08-26 (×9): 3 mL via INTRAVENOUS

## 2023-08-22 MED ORDER — TRAZODONE HCL 50 MG PO TABS
50.0000 mg | ORAL_TABLET | Freq: Every evening | ORAL | Status: DC | PRN
  Administered 2023-08-24 – 2023-08-25 (×2): 50 mg via ORAL
  Filled 2023-08-22 (×2): qty 1

## 2023-08-22 MED ORDER — BARICITINIB 2 MG PO TABS
4.0000 mg | ORAL_TABLET | Freq: Every day | ORAL | Status: DC
  Administered 2023-08-22 – 2023-08-26 (×5): 4 mg via ORAL
  Filled 2023-08-22 (×5): qty 2

## 2023-08-22 MED ORDER — MECLIZINE HCL 25 MG PO TABS: 25.0000 mg | ORAL_TABLET | Freq: Three times a day (TID) | ORAL | Status: DC | PRN

## 2023-08-22 MED ORDER — SODIUM CHLORIDE 0.9 % IV BOLUS
1000.0000 mL | Freq: Once | INTRAVENOUS | Status: AC
  Administered 2023-08-22: 1000 mL via INTRAVENOUS

## 2023-08-22 NOTE — H&P (Addendum)
History and Physical  RONIN REHFELDT ZOX:[id number] DOB: 31-Dec-1940 DOA: 08/22/2023  PCP: Rodrigo Ran, MD   Chief Complaint: Shortness of breath, hypoxia  HPI: Kenneth [name] is a 83 y.o. 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  She became symptomatic and was diagnosed with a home COVID test on 2/14.  O2 saturation was in the low 90s, which is baseline for him.  He started Paxlovid that day, has been up-to-date with his vaccines and boosters.  Started having increasingly worsening shortness of breath, nonproductive cough, pulse ox yesterday was in the mid 80s, this morning was in the upper 70s.  Has had some associated nausea, upper respiratory congestion, denies any fevers, chills, chest pain, vomiting, diarrhea or other complaints.  Presented to [facility], was placed on 4 L nasal cannula oxygen.  Workup as detailed below.  Admitted to the hospital service at [facility], after discussion with his pulmonologist Dr. Marchelle Gearing.  Review of Systems: Please see HPI for pertinent positives and negatives. A complete 10 system review of systems are otherwise negative.  Past Medical History:  Diagnosis Date   Arthritis    Depression    Diffuse large B cell lymphoma (HCC) 2023   Diverticulosis of colon (without mention of hemorrhage)    GERD (gastroesophageal reflux disease)    Hiatal hernia    HLD (hyperlipidemia)    Internal hemorrhoids without mention of complication    Irritable bowel syndrome    Other specified gastritis without mention of hemorrhage    Prostate cancer (HCC) 2022   Pulmonary fibrosis (HCC)    Stress fracture 02/2021   Past Surgical History:  Procedure Laterality Date   ANTERIOR CERVICAL DECOMP/DISCECTOMY FUSION N/A 08/11/2022   Procedure: Anterior Cervical  Decompression/Discectomy Fusion - Cervical Four-Cervical Five -  Cervical Five-Cervical Six - Cerivcal Six-Cerival Seven;  Surgeon: Julio Sicks, MD;  Location: [facility] OR;  Service: Neurosurgery;  Laterality: N/A;   CARPAL TUNNEL RELEASE     left   CATARACT EXTRACTION  06/2011   bilateral   DEEP NECK LYMPH NODE BIOPSY / EXCISION  03/25/2021   INGUINAL HERNIA REPAIR Right 09/05/2019   Procedure: OPEN REPAIR RIGHT INGUINAL HERNIA WITH MESH;  Surgeon: Darnell Level, MD;  Location: [facility] ORS;  Service: General;  Laterality: Right;   LUMBAR DISC SURGERY  1986, 1995   x 2   LUMBAR LAMINECTOMY/DECOMPRESSION MICRODISCECTOMY Left 07/11/2013   Procedure: LUMBAR ONE TO TWO, LUMBAR TWO TO THREE, LUMBAR THREE TO FOUR LUMBAR LAMINECTOMY/DECOMPRESSION MICRODISCECTOMY 3 LEVELS;  Surgeon: Temple Pacini, MD;  Location: [facility] NEURO ORS;  Service: Neurosurgery;  Laterality: Left;   NASAL SINUS SURGERY     RIGHT/LEFT HEART CATH AND CORONARY ANGIOGRAPHY N/A 04/14/2022   Procedure: RIGHT/LEFT HEART CATH AND CORONARY ANGIOGRAPHY;  Surgeon: Laurey Morale, MD;  Location: [facility] INVASIVE CV LAB;  Service: Cardiovascular;  Laterality: N/A;   Social History:  reports that he has never smoked. He has never used smokeless tobacco. He reports that he does not currently use alcohol. He reports that he does not use drugs.  No Known Allergies  Family History  Problem Relation Age of Onset   Heart failure Father 48   Bladder Cancer Mother    Breast cancer Mother    Colon cancer Neg Hx    Pancreatic cancer Neg Hx  Rectal cancer Neg Hx    Stomach cancer Neg Hx    Prostate cancer Neg Hx      Prior to Admission medications   Medication Sig Start Date End Date Taking? Authorizing Provider  acetaminophen (TYLENOL) 500 MG tablet Take 1,000 mg by mouth every 6 (six) hours as needed for moderate pain.    [provider]  bismuth subsalicylate (PEPTO BISMOL) 262 MG/15ML suspension Take 30 mLs by mouth every 6 (six) hours as  needed for diarrhea or loose stools or indigestion.    [provider]  carboxymethylcellulose (REFRESH TEARS) 0.5 % SOLN Place 1 drop into both eyes daily as needed (dry eyes).    [provider]  carisoprodol (SOMA) 250 MG tablet Take 350 mg by mouth 4 (four) times daily as needed. Takes 1/2 tablet each dose    [provider]  Cholecalciferol (VITAMIN D-3) 1000 UNITS CAPS Take 2,000 Units by mouth every evening.    [provider]  CO-ENZYME Q-10 PO Take 1 tablet by mouth every evening.     [provider]  Cyanocobalamin (VITAMIN B12) 1000 MCG TBCR Take 1,000 mcg by mouth daily.    [provider]  denosumab (PROLIA) 60 MG/ML SOSY injection Inject 60 mg into the skin every 6 (six) months. At [facility]    [provider]  desvenlafaxine (PRISTIQ) 50 MG 24 hr tablet Take 50 mg by mouth daily. 05/26/22   [provider]  diclofenac (VOLTAREN) 75 MG EC tablet Take 75 mg by mouth 2 (two) times daily as needed (pain.). 01/13/18   [provider]  diclofenac sodium (VOLTAREN) 1 % GEL Apply 4 g topically 4 (four) times daily as needed (pain.). 01/13/18   [provider]  dicyclomine (BENTYL) 10 MG capsule Take one by mouth every 6 hours as needed for abdominal cramping 01/24/15   Hilarie Fredrickson, MD  diphenoxylate-atropine (LOMOTIL) 2.5-0.025 MG tablet Take 1 tablet by mouth 4 (four) times daily as needed for diarrhea or loose stools. 02/22/17   [provider]  Emollient (AQUAPHOR ADV PROTECT HEALING EX) Apply 1 Application topically daily as needed (itching).    [provider]  Evolocumab (REPATHA [place]) Inject 140 mg into the skin every 30 (thirty) days.    [provider]  Fluticasone Propionate (XHANCE) 93 MCG/ACT EXHU Place 1 spray into both nostrils 2 (two) times daily as needed (sinus).    [provider]  Glucosamine Sulfate 500 MG TABS Take 500 mg by mouth daily.     [provider]  HYDROcodone-acetaminophen (NORCO/VICODIN) 5-325 MG tablet Take 1 tablet by mouth every 4 (four) hours as needed for moderate pain ((score 4 to 6)). 08/12/22   Julio Sicks, MD  latanoprost (XALATAN) 0.005 % ophthalmic solution Place 1 drop into both eyes at bedtime.    [provider]  loratadine (CLARITIN) 10 MG tablet Take 10 mg by mouth daily.    [provider]  meclizine (ANTIVERT) 25 MG tablet Take 25 mg by mouth 3 (three) times daily as needed for dizziness.    [provider]  methocarbamol (ROBAXIN) 500 MG tablet Take 500 mg by mouth every 8 (eight) hours as needed (severe muscle spasms). 10/27/10   [provider]  metoprolol succinate (TOPROL XL) 25 MG 24 hr tablet Take 1 tablet (25 mg total) by mouth 2 (two) times daily. 09/22/22   Laurey Morale, MD  Multiple Vitamin (MULTIVITAMIN) tablet Take 1 tablet by mouth  daily.    [provider]  omeprazole (PRILOSEC) 40 MG capsule TAKE 1 CAPSULE (40 MG TOTAL) BY MOUTH DAILY. 05/28/23   Hilarie Fredrickson, MD  ondansetron (ZOFRAN) 8 MG tablet TAKE 1 TABLET BY MOUTH EVERY 8 HOURS AS NEEDED FOR NAUSEA AND VOMITING 06/02/23   Ladene Artist, MD  Pirfenidone (ESBRIET) 801 MG TABS Take 1 tablet (801 mg total) by mouth 3 (three) times daily with meals. 03/25/23   Mannam, Colbert Coyer, MD  Probiotic Product (ALIGN) 4 MG CAPS Take 4 mg by mouth daily.    [provider]    Physical Exam: BP (!) 141/79   Pulse 97   Temp 98.2 F (36.8 C) (Oral)   Resp (!) 26   Ht 5\' 10"  (1.778 m)   Wt 77.8 kg   SpO2 (!) 89%   BMI 24.61 kg/m  General:  Alert, oriented, calm, in no acute distress, looks nontoxic resting comfortably on 4 L nasal cannula oxygen.  His wife is at the bedside. Cardiovascular: RRR, no murmurs or rubs, no peripheral edema  Respiratory: Diffuse crackles, no wheezing or rhonchi, no tachypnea.  No cough Abdomen: soft, nontender, nondistended, normal bowel tones heard   Skin: dry, no rashes  Musculoskeletal: no joint effusions, normal range of motion  Psychiatric: appropriate affect, normal speech  Neurologic: extraocular muscles intact, clear speech, moving all extremities with intact sensorium         Labs on Admission:  Basic Metabolic Panel: Recent Labs  Lab 08/22/23 1020 08/22/23 1024  NA 122* 124*  K 3.7 3.7  CL 90*  --   CO2 26  --   GLUCOSE 183*  --   BUN 7*  --   CREATININE 0.56*  --   CALCIUM 8.0*  --    Liver Function Tests: Recent Labs  Lab 08/22/23 1020  AST 23  ALT 13  ALKPHOS 38  BILITOT 0.6  PROT 6.4*  ALBUMIN 3.6   No results for input(s): "LIPASE", "AMYLASE" in the last 168 hours. No results for input(s): "AMMONIA" in the last 168 hours. CBC: Recent Labs  Lab 08/22/23 1020 08/22/23 1024  WBC 7.6  --   NEUTROABS 6.8  --   HGB 10.7* 11.2*  HCT 30.4* 33.0*  MCV 86.6  --   PLT 158  --    Cardiac Enzymes: No results for input(s): "CKTOTAL", "CKMB", "CKMBINDEX", "TROPONINI" in the last 168 hours. BNP (last 3 results) Recent Labs    12/01/22 1619 08/22/23 1020  BNP 86.0 581.7*    ProBNP (last 3 results) No results for input(s): "PROBNP" in the last 8760 hours.  CBG: No results for input(s): "GLUCAP" in the last 168 hours.  Radiological Exams on Admission: DG Chest Port 1 View Result Date: 08/22/2023 CLINICAL DATA:  Shortness of breath, recent COVID diagnosis. Possible sepsis. 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 Superimposed upon this there appears to be additional indistinctness of the pulmonary vasculature as well as abnormal central interstitial accentuation which could be from edema or atypical pneumonia. Mild enlargement of the cardiopericardial silhouette. No substantial blunting of the costophrenic angles. Mildly elevated right hemidiaphragm.  Atherosclerotic calcification of the aortic arch. Thoracic spondylosis. Questionable nodularity along the right lung base and left mid lung, follow up imaging to ensure resolution is likely warranted. IMPRESSION: 1. 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 2. Superimposed upon this  there appears to be additional indistinctness of the pulmonary vasculature as well as abnormal central interstitial accentuation which could be from edema or atypical pneumonia. 3. Mild enlargement of the cardiopericardial silhouette. 4. Questionable nodularity along the right lung base and left mid lung, follow up imaging to ensure resolution is likely warranted. 5. Aortic Atherosclerosis (ICD10-I70.0). Electronically Signed   By: Gaylyn Rong M.D.   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  Acute hypoxic respiratory failure-in the setting of COVID-19 infection, on background of interstitial fibrosis. -Inpatient admission to stepdown -Continue supplemental oxygen, wean as tolerated with goal O2 saturation greater than 90%  Severe sepsis-meeting criteria with tachycardia, tachypnea, source of infection is COVID-19.  Endorgan dysfunction with lactate up to 2.2.  Receiving COVID-specific therapy as below, received 1 L NaCl bolus.  Will trend lactate.  COVID-19 infection-seen in consultation by pulmonology Dr. Adalberto Ill later this afternoon -Continue supplemental oxygen -IV Decadron -IV remdesivir -P.o. baricitinib -Check D-dimer, if elevated pulmonology recommends full anticoagulation  Hyponatremia-patient does appear euvolemic on exam, and is asymptomatic.  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 -For now we will continue gentle NaCl infusion which was started for his lactic acidosis -Add fluid restriction, and recheck sodium level again in the morning Just check urinalysis, serum and urine osmole's, random urine sodium  Depression-continue Pristiq  Chronic CHF-appears euvolemic, continue home metoprolol and ASA  DVT prophylaxis: Lovenox     Code Status: Full Code  Consults called: Pulmonology  Admission status: The appropriate patient status for this patient is INPATIENT. Inpatient status is judged to be reasonable and necessary in order to provide the required intensity of service to ensure the patient's safety. The patient's presenting symptoms, physical exam findings, and initial radiographic and laboratory data in the context of their chronic comorbidities is felt to place them at high risk for further clinical deterioration. Furthermore, it is not anticipated that the patient will be medically stable for discharge from the hospital within 2 midnights of admission.    I certify that at the point of admission it is my clinical judgment that the patient will require inpatient hospital care spanning beyond 2 midnights from the point of admission due to high intensity of service, high risk for further deterioration and high frequency of surveillance required  Time spent: 59 minutes  Marianita Botkin Sharlette Dense MD [facility] Pager [phone]  If 7PM-7AM, please contact night-coverage [email or web address] Password [facility]  08/22/2023, 2:37 PM

## 2023-08-22 NOTE — ED Notes (Signed)
[facility] transport en route to get pt, bed assignment is ready

## 2023-08-22 NOTE — ED Notes (Signed)
2nd blood culture not obtained

## 2023-08-22 NOTE — Plan of Care (Signed)
Plan of Care Note for accepted transfer   Patient: Kenneth [name] MRN: [id number]   DOA: 08/22/2023  Facility requesting transfer: [facility] Requesting Provider: Dr. 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 Marchelle Gearing), high-grade B-cell lymphoma (follows with Dr. Truett Perna), prostate cancer, PVCs (seen by Dr. Shirlee Latch), CAD, HLD, AS who presented to [facility] with worsening dyspnea starting on 2/13. He tested positive for covid on 2/14 and was started on Paxlovid. He was found to be hypoxic in the 80s on workup and placed on 4 L oxygen at [facility]. Sodium found to be low, 122 and elevated BNP 581.  CXR showed background interstitial opacities consistent with known UIP and some additional concern for possible pulmonary edema vs atypical pna.   He was started on decadron and recommended for admission for ongoing treatment and monitoring.  Case was also discussed with Dr. Marchelle Gearing and he was recommended to be started on baricitinib upon transfer as well.   Plan of care: The patient is accepted for admission to [facility] unit, at [facility].  Author: Lewie Chamber, MD 08/22/2023  Check [email or web address] for on-call coverage.  Nursing staff, Please call [facility] Admits & Consults System-Wide number on Amion as soon as patient's arrival, so appropriate admitting provider can evaluate the pt.

## 2023-08-22 NOTE — ED Provider Notes (Signed)
[place] EMERGENCY DEPARTMENT AT [facility] Provider Note   CSN: [id number] Arrival date & time: 08/22/23  4098     History  Chief Complaint  Patient presents with   Shortness of Breath    Kenneth [name] is a 83 y.o. male.  HPI    83 year old male, who is a retired [employer] scientist with history of B-cell lymphoma, interstitial lung disease, comes into the emergency room accompanied by family member with chief complaint of shortness of breath.  On Thursday patient started having URI-like symptoms.  On Friday he was diagnosed with COVID-19 and started Paxlovid.  He has been up-to-date with vaccine and boosters, and has never had COVID.  Patient started having increasing shortness of breath, checked his pulse ox earlier today and noted that he was in the lowest 80s, upper 70s.  Normally his O2 sats are in the low 90s.  Patient has cough, nausea, congestion.  He denies any fevers. There is no history of PE.  Home Medications Prior to Admission medications   Medication Sig Start Date End Date Taking? Authorizing Provider  acetaminophen (TYLENOL) 500 MG tablet Take 1,000 mg by mouth every 6 (six) hours as needed for moderate pain.    [provider]  bismuth subsalicylate (PEPTO BISMOL) 262 MG/15ML suspension Take 30 mLs by mouth every 6 (six) hours as needed for diarrhea or loose stools or indigestion.    [provider]  carboxymethylcellulose (REFRESH TEARS) 0.5 % SOLN Place 1 drop into both eyes daily as needed (dry eyes).    [provider]  carisoprodol (SOMA) 250 MG tablet Take 350 mg by mouth 4 (four) times daily as needed. Takes 1/2 tablet each dose    [provider]  Cholecalciferol (VITAMIN D-3) 1000 UNITS CAPS Take 2,000 Units by mouth every evening.    [provider]  CO-ENZYME Q-10 PO Take 1 tablet by mouth every evening.     [provider]  Cyanocobalamin (VITAMIN B12) 1000 MCG TBCR Take 1,000 mcg by mouth  daily.    [provider]  denosumab (PROLIA) 60 MG/ML SOSY injection Inject 60 mg into the skin every 6 (six) months. At [facility]    [provider]  desvenlafaxine (PRISTIQ) 50 MG 24 hr tablet Take 50 mg by mouth daily. 05/26/22   [provider]  diclofenac (VOLTAREN) 75 MG EC tablet Take 75 mg by mouth 2 (two) times daily as needed (pain.). 01/13/18   [provider]  diclofenac sodium (VOLTAREN) 1 % GEL Apply 4 g topically 4 (four) times daily as needed (pain.). 01/13/18   [provider]  dicyclomine (BENTYL) 10 MG capsule Take one by mouth every 6 hours as needed for abdominal cramping 01/24/15   Hilarie Fredrickson, MD  diphenoxylate-atropine (LOMOTIL) 2.5-0.025 MG tablet Take 1 tablet by mouth 4 (four) times daily as needed for diarrhea or loose stools. 02/22/17   [provider]  Emollient (AQUAPHOR ADV PROTECT HEALING EX) Apply 1 Application topically daily as needed (itching).    [provider]  Evolocumab (REPATHA [place]) Inject 140 mg into the skin every 30 (thirty) days.    [provider]  Fluticasone Propionate (XHANCE) 93 MCG/ACT EXHU Place 1 spray into both nostrils 2 (two) times daily as needed (sinus).    [provider]  Glucosamine Sulfate 500 MG TABS Take 500 mg by mouth daily.    [provider]  HYDROcodone-acetaminophen (NORCO/VICODIN) 5-325 MG tablet Take 1 tablet by mouth  every 4 (four) hours as needed for moderate pain ((score 4 to 6)). 08/12/22   Julio Sicks, MD  latanoprost (XALATAN) 0.005 % ophthalmic solution Place 1 drop into both eyes at bedtime.    [provider]  loratadine (CLARITIN) 10 MG tablet Take 10 mg by mouth daily.    [provider]  meclizine (ANTIVERT) 25 MG tablet Take 25 mg by mouth 3 (three) times daily as needed for dizziness.    [provider]  methocarbamol (ROBAXIN) 500 MG tablet Take 500 mg by mouth every 8 (eight) hours as needed  (severe muscle spasms). 10/27/10   [provider]  metoprolol succinate (TOPROL XL) 25 MG 24 hr tablet Take 1 tablet (25 mg total) by mouth 2 (two) times daily. 09/22/22   Laurey Morale, MD  Multiple Vitamin (MULTIVITAMIN) tablet Take 1 tablet by mouth daily.    [provider]  omeprazole (PRILOSEC) 40 MG capsule TAKE 1 CAPSULE (40 MG TOTAL) BY MOUTH DAILY. 05/28/23   Hilarie Fredrickson, MD  ondansetron (ZOFRAN) 8 MG tablet TAKE 1 TABLET BY MOUTH EVERY 8 HOURS AS NEEDED FOR NAUSEA AND VOMITING 06/02/23   Ladene Artist, MD  Pirfenidone (ESBRIET) 801 MG TABS Take 1 tablet (801 mg total) by mouth 3 (three) times daily with meals. 03/25/23   Mannam, Colbert Coyer, MD  Probiotic Product (ALIGN) 4 MG CAPS Take 4 mg by mouth daily.    [provider]      Allergies    Patient has no known allergies.    Review of Systems   Review of Systems  All other systems reviewed and are negative.   Physical Exam Updated Vital Signs BP (!) 109/92   Pulse 93   Temp 99.5 F (37.5 C) (Oral)   Resp (!) 30   SpO2 93%  Physical Exam Vitals and nursing note reviewed.  HENT:     Head: Atraumatic.  Cardiovascular:     Rate and Rhythm: Tachycardia present.  Pulmonary:     Effort: Pulmonary effort is normal. Tachypnea present.     Breath sounds: Wheezing and rales present.     Comments: Diffuse rales and wheezing, but the rales appear to be slightly worse on the right side Musculoskeletal:     Cervical back: Neck supple.  Skin:    General: Skin is warm.  Neurological:     Mental Status: He is alert and oriented to person, place, and time.     ED Results / Procedures / Treatments   Labs (all labs ordered are listed, but only abnormal results are displayed) Labs Reviewed  SEDIMENTATION RATE - Abnormal; Notable for the following components:      Result Value   Sed Rate 19 (*)    All other components within normal limits  COMPREHENSIVE METABOLIC PANEL - Abnormal; Notable for the  following components:   Sodium 122 (*)    Chloride 90 (*)    Glucose, Bld 183 (*)    BUN 7 (*)    Creatinine, Ser 0.56 (*)    Calcium 8.0 (*)    Total Protein 6.4 (*)    All other components within normal limits  CBC WITH DIFFERENTIAL/PLATELET - Abnormal; Notable for the following components:   RBC 3.51 (*)    Hemoglobin 10.7 (*)    HCT 30.4 (*)    Lymphs Abs 0.2 (*)    All other components within normal limits  I-STAT VENOUS BLOOD GAS, ED - Abnormal; Notable for the following  components:   pH, Ven 7.435 (*)    pCO2, Ven 35.1 (*)    pO2, Ven 82 (*)    Sodium 124 (*)    Calcium, Ion 1.05 (*)    HCT 33.0 (*)    Hemoglobin 11.2 (*)    All other components within normal limits  CULTURE, BLOOD (ROUTINE X 2)  CULTURE, BLOOD (ROUTINE X 2)  RESP PANEL BY RT-PCR (RSV, FLU A&B, COVID)  RVPGX2  LACTIC ACID, PLASMA  PROTIME-INR  APTT  C-REACTIVE PROTEIN  PROCALCITONIN  LACTIC ACID, PLASMA  BRAIN NATRIURETIC PEPTIDE    EKG EKG Interpretation Date/Time:  Sunday August 22 2023 09:41:21 EST Ventricular Rate:  94 PR Interval:  172 QRS Duration:  90 QT Interval:  390 QTC Calculation: 483 R Axis:   24  Text Interpretation: Sinus rhythm Multiform ventricular premature complexes Minimal ST depression, lateral leads Borderline prolonged QT interval No significant change since last tracing Confirmed by Derwood Kaplan ([id number]) on 08/22/2023 11:04:56 AM  Radiology DG Chest Port 1 View Result Date: 08/22/2023 CLINICAL DATA:  Shortness of breath, recent COVID diagnosis. Possible sepsis. 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 Superimposed upon this there appears to be additional indistinctness of the pulmonary vasculature as well as abnormal central interstitial accentuation which could be from edema or atypical pneumonia. Mild enlargement of the  cardiopericardial silhouette. No substantial blunting of the costophrenic angles. Mildly elevated right hemidiaphragm. Atherosclerotic calcification of the aortic arch. Thoracic spondylosis. Questionable nodularity along the right lung base and left mid lung, follow up imaging to ensure resolution is likely warranted. IMPRESSION: 1. 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 2. Superimposed upon this there appears to be additional indistinctness of the pulmonary vasculature as well as abnormal central interstitial accentuation which could be from edema or atypical pneumonia. 3. Mild enlargement of the cardiopericardial silhouette. 4. Questionable nodularity along the right lung base and left mid lung, follow up imaging to ensure resolution is likely warranted. 5. Aortic Atherosclerosis (ICD10-I70.0). Electronically Signed   By: Gaylyn Rong M.D.   On: 08/22/2023 10:05    Procedures .Critical Care  Performed by: Derwood Kaplan, MD Authorized by: Derwood Kaplan, MD   Critical care provider statement:    Critical care time (minutes):  54   Critical care was necessary to treat or prevent imminent or life-threatening deterioration of the following conditions:  Respiratory failure   Critical care was time spent personally by me on the following activities:  Development of treatment plan with patient or surrogate, discussions with consultants, evaluation of patient's response to treatment, examination of patient, ordering and review of laboratory studies, ordering and review of radiographic studies, ordering and performing treatments and interventions, pulse oximetry, re-evaluation of patient's condition, review of old charts and obtaining history from patient or surrogate     Medications Ordered in ED Medications  dexamethasone (DECADRON) injection 6 mg (6 mg Intravenous Given 08/22/23 1057)  ipratropium-albuterol (DUONEB) 0.5-2.5 (3) MG/3ML  nebulizer solution 6 mL (6 mLs Nebulization Given 08/22/23 1032)    ED Course/ Medical Decision Making/ A&P                                 Medical Decision Making Amount and/or Complexity of Data Reviewed Labs: ordered. Radiology: ordered.  Risk Prescription drug management. Decision regarding hospitalization.  Mr. Mclean is a 83 year old male with history of interstitial lung disease for which she is on immunosuppressive agent and also B-cell lymphoma who comes into the ER with a positive home test for COVID-19 and noted to have hypoxia in the ER with O2 sats in the upper 70s.  He has been placed on 4 L of oxygen.  Tachypnea noted.  No respiratory distress at this time.  I reviewed patient's records including notes from pulmonary doctors, patient's medications.  I have also reviewed patient's CT PE from last year which was negative for PE, but did show some findings consistent with pulmonary hypertension.  Differential diagnosis for this gentleman includes acute hypoxic respiratory failure secondary to COVID-19, pulmonary edema, worsening fibrosis, superimposed bacterial pneumonia, PE, exacerbation of pulmonary hypertension.  Patient denies any fevers, does not have pronounced cough.  Plan is to get basic labs including COVID-19 test, inflammatory markers including procalcitonin and x-ray of the chest right now.  Given that patient is hypoxic, will give him IV 6 mg dexamethasone. He will need admission to the hospital.   I have independently interpreted patient's chest x-ray.  There is no evidence of focal consolidation, but diffuse lung markings noted.   11:42 AM Patient received IV dexamethasone along with nebulizer treatment in the ER. No profound leukocytosis.  X-ray does not show any concerning consolidation at this time.  No antibiotics given.  Low suspicion for PE, no indication for CT.  Patient will likely need immunomodulators. We do not have immunomodulators at this  emergency room.  Fortunately, there is a stepdown bed available at [facility] right now, patient will be admitted there and the medicine team is aware to start him on appropriate immunomodulators upon arrival.  I will also alert pulmonary critical care team given patient has high risk for getting worse and to see if they want to continue to see the patient.  Final Clinical Impression(s) / ED Diagnoses Final diagnoses:  COVID-19  Acute respiratory failure due to COVID-19 [facility])  Acute hypoxemic respiratory failure [facility])    Rx / DC Orders ED Discharge Orders     None         Derwood Kaplan, MD 08/22/23 1142

## 2023-08-22 NOTE — ED Triage Notes (Signed)
Pt c/Io shob, dx with covid on Friday, currently taking paxlovid. Pt placed on 4L O2. Sat 73 on RA prior to o2 Ttylenol today

## 2023-08-22 NOTE — Consult Note (Signed)
   NAME:  Kenneth [name], MRN:  [id number], DOB:  1940/10/03, LOS: 0 ADMISSION DATE:  08/22/2023, CONSULTATION DATE:  08/22/2023 REFERRING MD:  Dr Kirby Crigler, CHIEF COMPLAINT:  hypoxia   History of Present Illness:  Patient with hypoxemic respiratory failure  History of pulmonary fibrosis, tested positive for COVID Noted hypoxemia Came to the ED secondary to requiring oxygen supplementation  Had been started on Paxlovid, up-to-date on his vaccines  He has a nonproductive cough, some nausea, URI symptoms  [facility]ent into [facility] for evaluation  Pertinent  Medical History   Past Medical History:  Diagnosis Date   Arthritis    Depression    Diffuse large B cell lymphoma (HCC) 2023   Diverticulosis of colon (without mention of hemorrhage)    GERD (gastroesophageal reflux disease)    Hiatal hernia    HLD (hyperlipidemia)    Internal hemorrhoids without mention of complication    Irritable bowel syndrome    Other specified gastritis without mention of hemorrhage    Prostate cancer (HCC) 2022   Pulmonary fibrosis (HCC)    Stress fracture 02/2021     Significant Hospital Events: Including procedures, antibiotic start and stop dates in addition to other pertinent events   Chest x-ray-consistent with interstitial lung disease  Interim History / Subjective:  Shortness of breath, cough No chest pain or discomfort  Objective   Blood pressure (!) 141/79, pulse 97, temperature 98.2 F (36.8 C), temperature source Oral, resp. rate (!) 26, height 5\' 10"  (1.778 m), weight 77.8 kg, SpO2 (!) 89%.        Intake/Output Summary (Last 24 hours) at 08/22/2023 1506 Last data filed at 08/22/2023 1431 Gross per 24 hour  Intake 480 ml  Output 1090 ml  Net -610 ml   Filed Weights   08/22/23 1325  Weight: 77.8 kg    Examination: General: Elderly, does not appear to be an extremis HENT: Moist oral mucosa Lungs: Bibasal rales Cardiovascular: S1-S2 appreciated Abdomen: Soft, bowel sounds  appreciated Extremities: No clubbing, no edema Neuro: Awake alert oriented x 3 GU:   Resolved Hospital Problem list     Assessment & Plan:   COVID associated respiratory failure Underlying interstitial lung disease Hypoxemic respiratory failure -Antiviral, immunomodulator, steroids -Initiate remdesivir, Olumiant, Decadron  Continue oxygen supplementation -Maintain saturations over 89%  Check D-dimer -If elevated, consider full dose anticoagulation otherwise continue prophylactic anticoagulation at present  Interstitial lung disease -Continue Esbriet  Sepsis in the context of COVID  History of depression -Continue Pristiq  History of heart failure -On metoprolol  Appreciate consultation -Will continue to follow  Patient admitted to stepdown unit  Virl Diamond, MD [place] PCCM Pager: See Loretha Stapler

## 2023-08-22 NOTE — Plan of Care (Signed)
[place] has been stable on 4L [place], provider is aware of significant urinary output and has ordered a fluid restriction and urine testing.  Wife will bring Esbriet tomorrow morning as it isn't carried by our pharmacy.

## 2023-08-23 ENCOUNTER — Telehealth: Payer: Self-pay | Admitting: Internal Medicine

## 2023-08-23 ENCOUNTER — Other Ambulatory Visit: Payer: Self-pay

## 2023-08-23 DIAGNOSIS — J9601 Acute respiratory failure with hypoxia: Secondary | ICD-10-CM | POA: Diagnosis not present

## 2023-08-23 DIAGNOSIS — U071 COVID-19: Secondary | ICD-10-CM | POA: Diagnosis not present

## 2023-08-23 DIAGNOSIS — J849 Interstitial pulmonary disease, unspecified: Secondary | ICD-10-CM | POA: Diagnosis not present

## 2023-08-23 MED ORDER — IPRATROPIUM-ALBUTEROL 0.5-2.5 (3) MG/3ML IN SOLN
3.0000 mL | Freq: Two times a day (BID) | RESPIRATORY_TRACT | Status: DC
  Administered 2023-08-23 – 2023-08-26 (×7): 3 mL via RESPIRATORY_TRACT
  Filled 2023-08-23 (×7): qty 3

## 2023-08-23 MED ORDER — FUROSEMIDE 10 MG/ML IJ SOLN
40.0000 mg | Freq: Once | INTRAMUSCULAR | Status: AC
  Administered 2023-08-23: 40 mg via INTRAVENOUS
  Filled 2023-08-23: qty 4

## 2023-08-23 MED ORDER — SENNOSIDES-DOCUSATE SODIUM 8.6-50 MG PO TABS
1.0000 | ORAL_TABLET | Freq: Every evening | ORAL | Status: DC | PRN
Start: 2023-08-23 — End: 2023-08-26
  Administered 2023-08-24 – 2023-08-25 (×2): 1 via ORAL
  Filled 2023-08-23 (×2): qty 1

## 2023-08-23 MED ORDER — METOPROLOL TARTRATE 5 MG/5ML IV SOLN
5.0000 mg | INTRAVENOUS | Status: DC | PRN
  Administered 2023-08-24: 5 mg via INTRAVENOUS
  Filled 2023-08-23: qty 5

## 2023-08-23 MED ORDER — BUDESONIDE 0.5 MG/2ML IN SUSP
0.5000 mg | Freq: Two times a day (BID) | RESPIRATORY_TRACT | Status: DC
  Administered 2023-08-23 – 2023-08-26 (×7): 0.5 mg via RESPIRATORY_TRACT
  Filled 2023-08-23 (×7): qty 2

## 2023-08-23 MED ORDER — IPRATROPIUM-ALBUTEROL 0.5-2.5 (3) MG/3ML IN SOLN: 3.0000 mL | RESPIRATORY_TRACT | Status: DC | PRN

## 2023-08-23 MED ORDER — HYDRALAZINE HCL 20 MG/ML IJ SOLN: 10.0000 mg | INTRAMUSCULAR | Status: DC | PRN

## 2023-08-23 MED ORDER — CALCIUM GLUCONATE-NACL 2-0.675 GM/100ML-% IV SOLN
2.0000 g | Freq: Once | INTRAVENOUS | Status: AC
  Administered 2023-08-23: 2000 mg via INTRAVENOUS
  Filled 2023-08-23: qty 100

## 2023-08-23 MED ORDER — HYDROCODONE-ACETAMINOPHEN 10-325 MG PO TABS
1.0000 | ORAL_TABLET | ORAL | Status: AC | PRN
  Administered 2023-08-23 – 2023-08-25 (×3): 1 via ORAL
  Filled 2023-08-23 (×3): qty 1

## 2023-08-23 NOTE — Progress Notes (Signed)
   08/23/23 1521  TOC Brief Assessment  Insurance and Status Reviewed  Patient has primary care physician Yes  Home environment has been reviewed resides in private residence with spouse  Prior level of function: Independent  Prior/Current Home Services No current home services  Social Drivers of Health Review SDOH reviewed no interventions necessary  Readmission risk has been reviewed Yes  Transition of care needs no transition of care needs at this time

## 2023-08-23 NOTE — Plan of Care (Signed)

## 2023-08-23 NOTE — Progress Notes (Signed)
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  Diagnosed of COVID-19 infection in 2/14 admitted for hypoxia.  Assessment & Plan:  Principal Problem:   COVID-19   Acute hypoxia secondary to COVID-19 infection Interstitial pulmonary fibrosis -Patient still remains hypoxic.  Continue Decadron, baricitinib,bronchodilators, I-S/flutter valve.  Discontinue remdesivir.  Elevated BNP, will give gentle diuretic.  Trend inflammatory markers.  D-dimer borderline elevated, low threshold to start anticoagulation if necessary.  Hypocalcemia - Low ionized calcium.  IV calcium gluconate.  Acute on chronic diastolic CHF, EF 13% -Very mild exacerbation.  Elevated BNP 581.  Will give gentle diuretics.  Hyponatremia -Slowly improving.  Admission sodium 122  Large B-cell lymphoma/non-Hodgkin's History of prostate cancer, adenocarcinoma -Follows outpatient Dr Myrle Sheng.  Disease is currently in remission.  GERD -PPI  Depression -Continue home Effexor  DVT prophylaxis: Lovenox    Code Status: Full Code Family Communication:   Status is: Inpatient Remains inpatient appropriate because: Continue hospital stay for hypoxia management    Subjective: Seen at bed side, feels a little better compared to yesterday   Examination:  General exam: Appears calm and comfortable  Respiratory system: Some bibasilar rhonchi Cardiovascular system: S1 & S2 heard, RRR. No JVD, murmurs, rubs, gallops or clicks. No pedal edema. Gastrointestinal system: Abdomen is nondistended, soft and nontender. No organomegaly or masses felt. Normal bowel sounds heard. Central nervous  system: Alert and oriented. No focal neurological deficits. Extremities: Symmetric 5 x 5 power. Skin: No rashes, lesions or ulcers Psychiatry: Judgement and insight appear normal. Mood & affect appropriate.                Diet Orders (From admission, onward)     Start     Ordered   08/22/23 1634  Diet regular Room service appropriate? Yes; Fluid consistency: Thin; Fluid restriction: 1500 mL Fluid  Diet effective now       Question Answer Comment  Room service appropriate? Yes   Fluid consistency: Thin   Fluid restriction: 1500 mL Fluid      08/22/23 1633            Objective: Vitals:   08/23/23 0900 08/23/23 1000 08/23/23 1100 08/23/23 1200  BP: (!) 145/80  (!) 137/110 (!) 171/149  Pulse: 91 71 86 90  Resp: (!) 32 (!) 25 16 (!) 25  Temp:      TempSrc:      SpO2: (!) 87% 98% 95% 94%  Weight:      Height:        Intake/Output Summary (Last 24 hours) at 08/23/2023 1326 Last data filed at 08/23/2023 1255 Gross per 24 hour  Intake 2399.61 ml  Output 5135 ml  Net -2735.39 ml   Filed Weights   08/22/23 1325  Weight: 77.8 kg    Scheduled Meds:  aspirin EC  81 mg Oral Daily   baricitinib  4 mg Oral Daily   budesonide (PULMICORT) nebulizer solution  0.5 mg Nebulization BID   Chlorhexidine Gluconate Cloth  6 each Topical Daily   cholecalciferol  2,000 Units Oral QPM   dexamethasone (DECADRON) injection  6 mg Intravenous Q24H   enoxaparin (LOVENOX) injection  40 mg Subcutaneous Q24H  ipratropium-albuterol  3 mL Nebulization BID   latanoprost  1 drop Both Eyes QHS   loratadine  10 mg Oral Daily   metoprolol succinate  25 mg Oral BID   Pirfenidone  1 tablet Oral TID WC   sodium chloride flush  3 mL Intravenous Q12H   venlafaxine XR  75 mg Oral Q breakfast   Continuous Infusions:  Nutritional status     Body mass index is 24.61 kg/m.  Data Reviewed:   CBC: Recent Labs  Lab 08/22/23 1020 08/22/23 1024  WBC 7.6  --   NEUTROABS 6.8  --   HGB  10.7* 11.2*  HCT 30.4* 33.0*  MCV 86.6  --   PLT 158  --    Basic Metabolic Panel: Recent Labs  Lab 08/22/23 1020 08/22/23 1024 08/22/23 1654  NA 122* 124* 128*  K 3.7 3.7 4.4  CL 90*  --  95*  CO2 26  --  25  GLUCOSE 183*  --  183*  BUN 7*  --  7*  CREATININE 0.56*  --  0.60*  CALCIUM 8.0*  --  8.1*   GFR: Estimated Creatinine Clearance: 74.8 mL/min (A) (by C-G formula based on SCr of 0.6 mg/dL (L)). Liver Function Tests: Recent Labs  Lab 08/22/23 1020  AST 23  ALT 13  ALKPHOS 38  BILITOT 0.6  PROT 6.4*  ALBUMIN 3.6   No results for input(s): "LIPASE", "AMYLASE" in the last 168 hours. No results for input(s): "AMMONIA" in the last 168 hours. Coagulation Profile: Recent Labs  Lab 08/22/23 1020  INR 1.2   Cardiac Enzymes: No results for input(s): "CKTOTAL", "CKMB", "CKMBINDEX", "TROPONINI" in the last 168 hours. BNP (last 3 results) No results for input(s): "PROBNP" in the last 8760 hours. HbA1C: No results for input(s): "HGBA1C" in the last 72 hours. CBG: No results for input(s): "GLUCAP" in the last 168 hours. Lipid Profile: No results for input(s): "CHOL", "HDL", "LDLCALC", "TRIG", "CHOLHDL", "LDLDIRECT" in the last 72 hours. Thyroid Function Tests: No results for input(s): "TSH", "T4TOTAL", "FREET4", "T3FREE", "THYROIDAB" in the last 72 hours. Anemia Panel: No results for input(s): "VITAMINB12", "FOLATE", "FERRITIN", "TIBC", "IRON", "RETICCTPCT" in the last 72 hours. Sepsis Labs: Recent Labs  Lab 08/22/23 1020 08/22/23 1423 08/22/23 1903  PROCALCITON 0.12  --   --   LATICACIDVEN 1.6 2.2* 2.5*    Recent Results (from the past 240 hours)  Blood Culture (routine x 2)     Status: None (Preliminary result)   Collection Time: 08/22/23 10:03 AM   Specimen: BLOOD  Result Value Ref Range Status   Specimen Description   Final    BLOOD BLOOD RIGHT FOREARM Performed at [facility], [address], [place], [place] [address]     Special Requests   Final    Blood Culture adequate volume BOTTLES DRAWN AEROBIC AND ANAEROBIC Performed at [facility], [address] [place], [place] [address]    Culture   Final    NO GROWTH < 24 HOURS Performed at [facility], [address] [place], [place] [address]    Report Status PENDING  Incomplete  Resp panel by RT-PCR (RSV, Flu A&B, Covid) Anterior Nasal Swab     Status: Abnormal   Collection Time: 08/22/23 11:03 AM   Specimen: Anterior Nasal Swab  Result Value Ref Range Status   SARS Coronavirus 2 by RT PCR POSITIVE (A) NEGATIVE Final    Comment: (NOTE) SARS-CoV-2 target nucleic acids are DETECTED.  The  SARS-CoV-2 RNA is generally detectable in upper respiratory specimens during the acute phase of infection. Positive results are indicative of the presence of the identified virus, but do not rule out bacterial infection or co-infection with other pathogens not detected by the test. Clinical correlation with patient history and other diagnostic information is necessary to determine patient infection status. The expected result is Negative.  Fact Sheet for Patients: BloggerCourse.com  Fact Sheet for Healthcare Providers: SeriousBroker.it  This test is not yet approved or cleared by the [place] FDA and  has been authorized for detection and/or diagnosis of SARS-CoV-2 by FDA under an Emergency Use Authorization (EUA).  This EUA will remain in effect (meaning this test can be used) for the duration of  the COVID-19 declaration under Section 564(b)(1) of the A ct, 21 U.S.C. section 360bbb-3(b)(1), unless the authorization is terminated or revoked sooner.     Influenza A by PCR NEGATIVE NEGATIVE Final   Influenza B by PCR NEGATIVE NEGATIVE Final    Comment: (NOTE) The Xpert Xpress SARS-CoV-2/FLU/RSV plus assay is intended as an aid in the diagnosis of influenza from Nasopharyngeal  swab specimens and should not be used as a sole basis for treatment. Nasal washings and aspirates are unacceptable for Xpert Xpress SARS-CoV-2/FLU/RSV testing.  Fact Sheet for Patients: BloggerCourse.com  Fact Sheet for Healthcare Providers: SeriousBroker.it  This test is not yet approved or cleared by the [place] FDA and has been authorized for detection and/or diagnosis of SARS-CoV-2 by FDA under an Emergency Use Authorization (EUA). This EUA will remain in effect (meaning this test can be used) for the duration of the COVID-19 declaration under Section 564(b)(1) of the Act, 21 U.S.C. section 360bbb-3(b)(1), unless the authorization is terminated or revoked.     Resp Syncytial Virus by PCR NEGATIVE NEGATIVE Final    Comment: (NOTE) Fact Sheet for Patients: BloggerCourse.com  Fact Sheet for Healthcare Providers: SeriousBroker.it  This test is not yet approved or cleared by the [place] FDA and has been authorized for detection and/or diagnosis of SARS-CoV-2 by FDA under an Emergency Use Authorization (EUA). This EUA will remain in effect (meaning this test can be used) for the duration of the COVID-19 declaration under Section 564(b)(1) of the Act, 21 U.S.C. section 360bbb-3(b)(1), unless the authorization is terminated or revoked.  Performed at [facility], [address] [place], [place] [address]   MRSA Next Gen by PCR, Nasal     Status: None   Collection Time: 08/22/23  1:24 PM   Specimen: Nasal Mucosa; Nasal Swab  Result Value Ref Range Status   MRSA by PCR Next Gen NOT DETECTED NOT DETECTED Final    Comment: (NOTE) The GeneXpert MRSA Assay (FDA approved for NASAL specimens only), is one component of a comprehensive MRSA colonization surveillance program. It is not intended to diagnose MRSA infection nor to guide or monitor treatment  for MRSA infections. Test performance is not FDA approved in patients less than 60 years old. Performed at [facility], [address], [place], [place] [address]   Blood Culture (routine x 2)     Status: None (Preliminary result)   Collection Time: 08/22/23  2:23 PM   Specimen: BLOOD LEFT ARM  Result Value Ref Range Status   Specimen Description   Final    BLOOD LEFT ARM Performed at [facility], [address], [place], [place] [address]    Special Requests   Final    BOTTLES DRAWN AEROBIC  AND ANAEROBIC Blood Culture adequate volume Performed at [facility], [address] [place], [place] [address]    Culture   Final    NO GROWTH < 24 HOURS Performed at [facility], [address], [place], [place] [address]    Report Status PENDING  Incomplete         Radiology Studies: DG Chest Port 1 View Result Date: 08/22/2023 CLINICAL DATA:  Shortness of breath, recent COVID diagnosis. Possible sepsis. 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 Superimposed upon this there appears to be additional indistinctness of the pulmonary vasculature as well as abnormal central interstitial accentuation which could be from edema or atypical pneumonia. Mild enlargement of the cardiopericardial silhouette. No substantial blunting of the costophrenic angles. Mildly elevated right hemidiaphragm. Atherosclerotic calcification of the aortic arch. Thoracic spondylosis. Questionable nodularity along the right lung base and left mid lung, follow up imaging to ensure resolution is likely warranted. IMPRESSION: 1. 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 2. Superimposed upon this there appears to be additional indistinctness of the pulmonary vasculature as well as  abnormal central interstitial accentuation which could be from edema or atypical pneumonia. 3. Mild enlargement of the cardiopericardial silhouette. 4. Questionable nodularity along the right lung base and left mid lung, follow up imaging to ensure resolution is likely warranted. 5. Aortic Atherosclerosis (ICD10-I70.0). Electronically Signed   By: Gaylyn Rong M.D.   On: 08/22/2023 10:05           LOS: 1 day   Time spent= 35 mins    Miguel Rota, MD [facility]  If 7PM-7AM, please contact night-coverage  08/23/2023, 1:26 PM

## 2023-08-23 NOTE — Progress Notes (Signed)
   NAME:  Kenneth [name], MRN:  [id number], DOB:  February 14, 1941, LOS: 1 ADMISSION DATE:  08/22/2023, CONSULTATION DATE:  08/22/2023 REFERRING MD:  Dr Kirby Crigler, CHIEF COMPLAINT:  hypoxia   History of Present Illness:  Patient with hypoxemic respiratory failure  History of pulmonary fibrosis, tested positive for COVID Noted hypoxemia Came to the ED secondary to requiring oxygen supplementation  Had been started on Paxlovid, up-to-date on his vaccines  He has a nonproductive cough, some nausea, URI symptoms  [facility]  Pertinent  Medical History   Past Medical History:  Diagnosis Date   Arthritis    Depression    Diffuse large B cell lymphoma (HCC) 2023   Diverticulosis of colon (without mention of hemorrhage)    GERD (gastroesophageal reflux disease)    Hiatal hernia    HLD (hyperlipidemia)    Internal hemorrhoids without mention of complication    Irritable bowel syndrome    Other specified gastritis without mention of hemorrhage    Prostate cancer (HCC) 2022   Pulmonary fibrosis (HCC)    Stress fracture 02/2021     Significant Hospital Events: Including procedures, antibiotic start and stop dates in addition to other pertinent events   Chest x-ray-consistent with interstitial lung disease  Interim History / Subjective:  Feeling a bit better, reamins on 4L [place], mild tachypnea persists  Objective   Blood pressure (!) 148/100, pulse 75, temperature 98.5 F (36.9 C), resp. rate (!) 22, height 5\' 10"  (1.778 m), weight 77.8 kg, SpO2 (!) 82%.        Intake/Output Summary (Last 24 hours) at 08/23/2023 1034 Last data filed at 08/23/2023 1012 Gross per 24 hour  Intake 2785.72 ml  Output 5635 ml  Net -2849.28 ml   Filed Weights   08/22/23 1325  Weight: 77.8 kg    Examination: General: Elderly, does not appear to be an extremis HENT: Moist oral mucosa Lungs: Bibasal rales, mild tachypnea Cardiovascular: S1-S2 appreciated Abdomen: Soft, bowel  sounds appreciated Extremities: No clubbing, no edema Neuro: Awake alert oriented x 3   Resolved Hospital Problem list     Assessment & Plan:   Chronic interstitial lung disease Acute Hypoxemic respiratory failure due to Covid 19 -Continue dexamethasone 6 mg planned 10 days, continue baricitinib -Remdesivir discontinued 2/17 as likely no benefit given clinical setting -Diurese as BP and kidney function allows -May need new oxygen therapy when goes home, ideally would be on minimal to no oxygen since he did not require oxygen previously but given his underlying interstitial lung disease is quite possible -If oxygen needed, ensure he understands how much is needed with rest, please ambulate patient prior to discharge and explain how much is needed with exertion to maintain oxygen saturation greater than 88% -Maintain saturations over 88%, anticipate desaturation with exertion  Interstitial lung disease -Continue Esbriet -Ok to continue study drug if deemed ok per research team  PCCM available as needed.  Not much more to add at this point.  Time for recovery and supportive care with dexamethasone and baricitinib.  Karren Burly, MD [facility] See Amion If no response please contact on-call PCCM pager [phone] until 7 PM Between 7 PM and 7 AM please contact E-link

## 2023-08-23 NOTE — Telephone Encounter (Cosign Needed)
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  Serious adverse event (SAE) has been filed and the study drug is on hold till the patient is discharged from the hospital.

## 2023-08-23 NOTE — Plan of Care (Signed)
   Problem: Education: Goal: Knowledge of risk factors and measures for prevention of condition will improve Outcome: Progressing   Problem: Coping: Goal: Psychosocial and spiritual needs will be supported Outcome: Progressing   Problem: Respiratory: Goal: Will maintain a patent airway Outcome: Progressing

## 2023-08-23 NOTE — Hospital Course (Addendum)
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  Diagnosed of COVID-19 infection in 2/14 admitted for hypoxia.  Assessment & Plan:  Principal Problem:   COVID-19   Acute hypoxia secondary to COVID-19 infection Interstitial pulmonary fibrosis -Patient still remains hypoxic.  Continue Decadron, baricitinib,bronchodilators, I-S/flutter valve.  Discontinue remdesivir.  Elevated BNP, will give gentle diuretic.  Trend inflammatory markers.  D-dimer borderline elevated, low threshold to start anticoagulation if necessary.  Hypocalcemia - Low ionized calcium.  IV calcium gluconate.  Acute on chronic diastolic CHF, EF 60% -Very mild exacerbation.  Elevated BNP 581.  Will give gentle diuretics.  Hyponatremia -Slowly improving.  Admission sodium 122  Large B-cell lymphoma/non-Hodgkin's History of prostate cancer, adenocarcinoma -Follows outpatient Dr Myrle Sheng.  Disease is currently in remission.  GERD -PPI  Depression -Continue home Effexor  DVT prophylaxis: Lovenox    Code Status: Full Code Family Communication:   Status is: Inpatient Remains inpatient appropriate because: Continue hospital stay for hypoxia management    Subjective: Seen at bed side, feels a little better compared to yesterday   Examination:  General exam: Appears calm and comfortable  Respiratory system: Some bibasilar rhonchi Cardiovascular system: S1 & S2 heard, RRR. No JVD, murmurs, rubs, gallops or clicks. No pedal edema. Gastrointestinal system: Abdomen is nondistended, soft and nontender. No organomegaly or masses felt. Normal bowel sounds heard. Central nervous system: Alert and oriented. No focal neurological deficits. Extremities: Symmetric 5 x 5 power. Skin: No  rashes, lesions or ulcers Psychiatry: Judgement and insight appear normal. Mood & affect appropriate.

## 2023-08-24 DIAGNOSIS — U071 COVID-19: Secondary | ICD-10-CM | POA: Diagnosis not present

## 2023-08-24 LAB — CBC
HCT: 32.7 % — ABNORMAL LOW (ref 39.0–52.0)
Hemoglobin: 11 g/dL — ABNORMAL LOW (ref 13.0–17.0)
MCH: 30.2 pg (ref 26.0–34.0)
MCHC: 33.6 g/dL (ref 30.0–36.0)
MCV: 89.8 fL (ref 80.0–100.0)
Platelets: 217 10*3/uL (ref 150–400)
RBC: 3.64 MIL/uL — ABNORMAL LOW (ref 4.22–5.81)
RDW: 13.9 % (ref 11.5–15.5)
WBC: 5.9 10*3/uL (ref 4.0–10.5)
nRBC: 0 % (ref 0.0–0.2)

## 2023-08-24 LAB — VITAMIN B12: Vitamin B-12: 482 pg/mL (ref 180–914)

## 2023-08-24 LAB — C-REACTIVE PROTEIN: CRP: 7.6 mg/dL — ABNORMAL HIGH (ref <1.0)

## 2023-08-24 LAB — BASIC METABOLIC PANEL
Anion gap: 10 (ref 5–15)
BUN: 22 mg/dL (ref 8–23)
CO2: 25 mmol/L (ref 22–32)
Calcium: 8.5 mg/dL — ABNORMAL LOW (ref 8.9–10.3)
Chloride: 95 mmol/L — ABNORMAL LOW (ref 98–111)
Creatinine, Ser: 0.53 mg/dL — ABNORMAL LOW (ref 0.61–1.24)
GFR, Estimated: >60 mL/min (ref >60)
Glucose, Bld: 148 mg/dL — ABNORMAL HIGH (ref 70–99)
Potassium: 4.4 mmol/L (ref 3.5–5.1)
Sodium: 130 mmol/L — ABNORMAL LOW (ref 135–145)

## 2023-08-24 LAB — PHOSPHORUS: Phosphorus: 2.4 mg/dL — ABNORMAL LOW (ref 2.5–4.6)

## 2023-08-24 LAB — D-DIMER, QUANTITATIVE: D-Dimer, Quant: 0.59 ug{FEU}/mL — ABNORMAL HIGH (ref 0.00–0.50)

## 2023-08-24 LAB — MAGNESIUM: Magnesium: 2.3 mg/dL (ref 1.7–2.4)

## 2023-08-24 NOTE — Telephone Encounter (Signed)
AS PI for study   Title:A Randomized, Double-blind, Placebo-controlled, Phase 3 Study of the Efficacy and Safety of Inhaled Treprostinil in Subjects with Idiopathic Pulmonary Fibrosis   Dose and Duration of Treatment:Treprostinil for Inhalation 0.6 mg/mL, or placebo (randomly assigned 1:1) over a 52 week period.  Protocol # RIN-PF-301; IND # [id number]; Clinical Trials.gov Identifier: [id number]  Sponsor: [employer], [place] [address]    -Been in touch with the subject [name] on the day of the admission and with the ER doc explaining patient's condition.  The following day of the admission spoke to the hospitalist.  Today also received a direct message from the subject saying he was improving.  The looking at the discharge in a few days.  -Discussed with coordinator 2 days ago and instructed to submit SAE report   PI OVERSIGHT ATTESTATION  I the Principal Investigator (PI) for the above mentioned study attest that I reviewed the mentioned  clinical research coordinator  notes on research subject  Kenneth [name]  born 18-Mar-1941 . I  agree with the findings mentioned above   Dr.Menucha Dicesare Marchelle Gearing, MD Pulmonary and Critical Care Medicine Research Investigator & Staff Physician [facility] and [facility]  [place] [facility] Pager: [phone], If no answer or between  15:00h - 7:00h: call [phone]  08/24/2023 6:31 PM

## 2023-08-24 NOTE — Progress Notes (Signed)
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  Diagnosed of COVID-19 infection in 2/14 admitted for hypoxia.  Inflammatory markers elevated therefore placed on baricitinib, steroids, bronchodilators.  Assessment & Plan:  Principal Problem:   COVID-19   Acute hypoxia secondary to COVID-19 infection Interstitial pulmonary fibrosis -Patient still remains hypoxic.  Continue Decadron, baricitinib,bronchodilators, I-S/flutter valve.  Discontinue remdesivir.  Elevated BNP, received a dose of Lasix IV.  Inflammatory markers are trending down.  I am thinking patient will need long-term oxygen, agreeable upon discharge.  Hypocalcemia/hypophosphatemia - As needed repletion  Acute on chronic diastolic CHF, EF 81% -Very mild exacerbation.  Elevated BNP 581.  Received a dose of diuretic.  Will clinically monitor and diurese as needed  Hyponatremia, improved -Slowly improving.  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  Disease is currently in remission.  GERD -PPI  Depression -Continue home Effexor  DVT prophylaxis: Lovenox    Code Status: Full Code Family Communication: Spouse is at bedside Status is: Inpatient Remains inpatient appropriate because: Continue hospital stay for hypoxia management.  I think he should be to go home by 2/20.    Subjective: Seen and bedside, feeling little better.  Wants to try and take most of his home medications with nursing supervision.  I will be okay with it if pharmacy allows it.  Otherwise overall he feels a  little better compared to yesterday.  Eventually he is agreeable to go home with home oxygen if becomes necessary at the time of discharge.   Examination:  General exam: Appears calm and comfortable  Respiratory system: Some bibasilar rhonchi, improved Cardiovascular system: S1 & S2 heard, RRR. No JVD, murmurs, rubs, gallops or clicks. No pedal edema. Gastrointestinal system: Abdomen is nondistended, soft and nontender. No organomegaly or masses felt. Normal bowel sounds heard. Central nervous system: Alert and oriented. No focal neurological deficits. Extremities: Symmetric 5 x 5 power. Skin: No rashes, lesions or ulcers Psychiatry: Judgement and insight appear normal. Mood & affect appropriate.                Diet Orders (From admission, onward)     Start     Ordered   08/22/23 1634  Diet regular Room service appropriate? Yes; Fluid consistency: Thin; Fluid restriction: 1500 mL Fluid  Diet effective now       Question Answer Comment  Room service appropriate? Yes   Fluid consistency: Thin   Fluid restriction: 1500 mL Fluid      08/22/23 1633            Objective: Vitals:   08/24/23 1010 08/24/23 1015 08/24/23 1020 08/24/23 1025  BP:      Pulse: (!) 113 (!) 114 (!) 111 (!) 107  Resp: (!) 23 19 (!) 24 (!) 25  Temp:      TempSrc:      SpO2: 97% 98% 97% 97%  Weight:      Height:        Intake/Output Summary (Last 24 hours) at 08/24/2023 1139 Last data filed at 08/24/2023 0958 Gross per 24 hour  Intake 1253.89 ml  Output 1100  ml  Net 153.89 ml   Filed Weights   08/22/23 1325  Weight: 77.8 kg    Scheduled Meds:  aspirin EC  81 mg Oral Daily   baricitinib  4 mg Oral Daily   budesonide (PULMICORT) nebulizer solution  0.5 mg Nebulization BID   Chlorhexidine Gluconate Cloth  6 each Topical Daily   cholecalciferol  2,000 Units Oral QPM   dexamethasone (DECADRON) injection  6 mg Intravenous Q24H   enoxaparin (LOVENOX) injection  40 mg Subcutaneous Q24H    ipratropium-albuterol  3 mL Nebulization BID   latanoprost  1 drop Both Eyes QHS   loratadine  10 mg Oral Daily   metoprolol succinate  25 mg Oral BID   Pirfenidone  1 tablet Oral TID WC   sodium chloride flush  3 mL Intravenous Q12H   venlafaxine XR  75 mg Oral Q breakfast   Continuous Infusions:  Nutritional status     Body mass index is 24.61 kg/m.  Data Reviewed:   CBC: Recent Labs  Lab 08/22/23 1020 08/22/23 1024 08/24/23 0257  WBC 7.6  --  5.9  NEUTROABS 6.8  --   --   HGB 10.7* 11.2* 11.0*  HCT 30.4* 33.0* 32.7*  MCV 86.6  --  89.8  PLT 158  --  217   Basic Metabolic Panel: Recent Labs  Lab 08/22/23 1020 08/22/23 1024 08/22/23 1654 08/24/23 0257  NA 122* 124* 128* 130*  K 3.7 3.7 4.4 4.4  CL 90*  --  95* 95*  CO2 26  --  25 25  GLUCOSE 183*  --  183* 148*  BUN 7*  --  7* 22  CREATININE 0.56*  --  0.60* 0.53*  CALCIUM 8.0*  --  8.1* 8.5*  MG  --   --   --  2.3  PHOS  --   --   --  2.4*   GFR: Estimated Creatinine Clearance: 74.8 mL/min (A) (by C-G formula based on SCr of 0.53 mg/dL (L)). Liver Function Tests: Recent Labs  Lab 08/22/23 1020  AST 23  ALT 13  ALKPHOS 38  BILITOT 0.6  PROT 6.4*  ALBUMIN 3.6   No results for input(s): "LIPASE", "AMYLASE" in the last 168 hours. No results for input(s): "AMMONIA" in the last 168 hours. Coagulation Profile: Recent Labs  Lab 08/22/23 1020  INR 1.2   Cardiac Enzymes: No results for input(s): "CKTOTAL", "CKMB", "CKMBINDEX", "TROPONINI" in the last 168 hours. BNP (last 3 results) No results for input(s): "PROBNP" in the last 8760 hours. HbA1C: No results for input(s): "HGBA1C" in the last 72 hours. CBG: No results for input(s): "GLUCAP" in the last 168 hours. Lipid Profile: No results for input(s): "CHOL", "HDL", "LDLCALC", "TRIG", "CHOLHDL", "LDLDIRECT" in the last 72 hours. Thyroid Function Tests: No results for input(s): "TSH", "T4TOTAL", "FREET4", "T3FREE", "THYROIDAB" in the last 72  hours. Anemia Panel: Recent Labs    08/24/23 0257  VITAMINB12 482   Sepsis Labs: Recent Labs  Lab 08/22/23 1020 08/22/23 1423 08/22/23 1903  PROCALCITON 0.12  --   --   LATICACIDVEN 1.6 2.2* 2.5*    Recent Results (from the past 240 hours)  Blood Culture (routine x 2)     Status: None (Preliminary result)   Collection Time: 08/22/23 10:03 AM   Specimen: BLOOD  Result Value Ref Range Status   Specimen Description   Final    BLOOD BLOOD RIGHT FOREARM Performed at [facility], [address] [place], [place] [address]  Special Requests   Final    Blood Culture adequate volume BOTTLES DRAWN AEROBIC AND ANAEROBIC Performed at [facility], [address] [place], [place] [address]    Culture   Final    NO GROWTH 2 DAYS Performed at [facility], [address], [place], [place] [address]    Report Status PENDING  Incomplete  Resp panel by RT-PCR (RSV, Flu A&B, Covid) Anterior Nasal Swab     Status: Abnormal   Collection Time: 08/22/23 11:03 AM   Specimen: Anterior Nasal Swab  Result Value Ref Range Status   SARS Coronavirus 2 by RT PCR POSITIVE (A) NEGATIVE Final    Comment: (NOTE) SARS-CoV-2 target nucleic acids are DETECTED.  The SARS-CoV-2 RNA is generally detectable in upper respiratory specimens during the acute phase of infection. Positive results are indicative of the presence of the identified virus, but do not rule out bacterial infection or co-infection with other pathogens not detected by the test. Clinical correlation with patient history and other diagnostic information is necessary to determine patient infection status. The expected result is Negative.  Fact Sheet for Patients: BloggerCourse.com  Fact Sheet for Healthcare Providers: SeriousBroker.it  This test is not yet approved or cleared by the [place] FDA and  has been authorized for  detection and/or diagnosis of SARS-CoV-2 by FDA under an Emergency Use Authorization (EUA).  This EUA will remain in effect (meaning this test can be used) for the duration of  the COVID-19 declaration under Section 564(b)(1) of the A ct, 21 U.S.C. section 360bbb-3(b)(1), unless the authorization is terminated or revoked sooner.     Influenza A by PCR NEGATIVE NEGATIVE Final   Influenza B by PCR NEGATIVE NEGATIVE Final    Comment: (NOTE) The Xpert Xpress SARS-CoV-2/FLU/RSV plus assay is intended as an aid in the diagnosis of influenza from Nasopharyngeal swab specimens and should not be used as a sole basis for treatment. Nasal washings and aspirates are unacceptable for Xpert Xpress SARS-CoV-2/FLU/RSV testing.  Fact Sheet for Patients: BloggerCourse.com  Fact Sheet for Healthcare Providers: SeriousBroker.it  This test is not yet approved or cleared by the [place] FDA and has been authorized for detection and/or diagnosis of SARS-CoV-2 by FDA under an Emergency Use Authorization (EUA). This EUA will remain in effect (meaning this test can be used) for the duration of the COVID-19 declaration under Section 564(b)(1) of the Act, 21 U.S.C. section 360bbb-3(b)(1), unless the authorization is terminated or revoked.     Resp Syncytial Virus by PCR NEGATIVE NEGATIVE Final    Comment: (NOTE) Fact Sheet for Patients: BloggerCourse.com  Fact Sheet for Healthcare Providers: SeriousBroker.it  This test is not yet approved or cleared by the [place] FDA and has been authorized for detection and/or diagnosis of SARS-CoV-2 by FDA under an Emergency Use Authorization (EUA). This EUA will remain in effect (meaning this test can be used) for the duration of the COVID-19 declaration under Section 564(b)(1) of the Act, 21 U.S.C. section 360bbb-3(b)(1), unless the authorization is  terminated or revoked.  Performed at [facility], [address] [place], [place] [address]   MRSA Next Gen by PCR, Nasal     Status: None   Collection Time: 08/22/23  1:24 PM   Specimen: Nasal Mucosa; Nasal Swab  Result Value Ref Range Status   MRSA by PCR Next Gen NOT DETECTED NOT DETECTED Final    Comment: (NOTE) The GeneXpert MRSA Assay (FDA approved for NASAL specimens only), is one  component of a comprehensive MRSA colonization surveillance program. It is not intended to diagnose MRSA infection nor to guide or monitor treatment for MRSA infections. Test performance is not FDA approved in patients less than 13 years old. Performed at [facility], [address] [place], [place] [address]   Blood Culture (routine x 2)     Status: None (Preliminary result)   Collection Time: 08/22/23  2:23 PM   Specimen: BLOOD LEFT ARM  Result Value Ref Range Status   Specimen Description   Final    BLOOD LEFT ARM Performed at [facility], [address] [place], [place] [address]    Special Requests   Final    BOTTLES DRAWN AEROBIC AND ANAEROBIC Blood Culture adequate volume Performed at [facility], [address], [place], [place] [address]    Culture   Final    NO GROWTH 2 DAYS Performed at [facility], [address], [place], [place] [address]    Report Status PENDING  Incomplete         Radiology Studies: No results found.         LOS: 2 days   Time spent= 35 mins    Miguel Rota, MD [facility]  If 7PM-7AM, please contact night-coverage  08/24/2023, 11:39 AM

## 2023-08-24 NOTE — Plan of Care (Signed)

## 2023-08-24 NOTE — Plan of Care (Signed)
  Problem: Education: Goal: Knowledge of risk factors and measures for prevention of condition will improve Outcome: Progressing   Problem: Coping: Goal: Psychosocial and spiritual needs will be supported Outcome: Progressing   Problem: Respiratory: Goal: Will maintain a patent airway Outcome: Progressing   Problem: Education: Goal: Knowledge of General Education information will improve Description: Including pain rating scale, medication(s)/side effects and non-pharmacologic comfort measures Outcome: Progressing   Problem: Clinical Measurements: Goal: Ability to maintain clinical measurements within normal limits will improve Outcome: Progressing Goal: Respiratory complications will improve Outcome: Progressing

## 2023-08-24 NOTE — Progress Notes (Signed)
Report called. Belongings collected. Called spouse twice to notify about transfer but no reply. Pt transferred. Pt stable at time of transfer

## 2023-08-25 DIAGNOSIS — U071 COVID-19: Secondary | ICD-10-CM | POA: Diagnosis not present

## 2023-08-25 LAB — BASIC METABOLIC PANEL
Anion gap: 6 (ref 5–15)
BUN: 22 mg/dL (ref 8–23)
CO2: 29 mmol/L (ref 22–32)
Calcium: 8.4 mg/dL — ABNORMAL LOW (ref 8.9–10.3)
Chloride: 94 mmol/L — ABNORMAL LOW (ref 98–111)
Creatinine, Ser: 0.59 mg/dL — ABNORMAL LOW (ref 0.61–1.24)
GFR, Estimated: >60 mL/min (ref >60)
Glucose, Bld: 106 mg/dL — ABNORMAL HIGH (ref 70–99)
Potassium: 4.3 mmol/L (ref 3.5–5.1)
Sodium: 129 mmol/L — ABNORMAL LOW (ref 135–145)

## 2023-08-25 LAB — CBC
HCT: 32.7 % — ABNORMAL LOW (ref 39.0–52.0)
Hemoglobin: 11.1 g/dL — ABNORMAL LOW (ref 13.0–17.0)
MCH: 30.2 pg (ref 26.0–34.0)
MCHC: 33.9 g/dL (ref 30.0–36.0)
MCV: 88.9 fL (ref 80.0–100.0)
Platelets: 238 10*3/uL (ref 150–400)
RBC: 3.68 MIL/uL — ABNORMAL LOW (ref 4.22–5.81)
RDW: 13.7 % (ref 11.5–15.5)
WBC: 6.4 10*3/uL (ref 4.0–10.5)
nRBC: 0 % (ref 0.0–0.2)

## 2023-08-25 LAB — C-REACTIVE PROTEIN: CRP: 2.5 mg/dL — ABNORMAL HIGH (ref <1.0)

## 2023-08-25 LAB — MAGNESIUM: Magnesium: 2.3 mg/dL (ref 1.7–2.4)

## 2023-08-25 LAB — D-DIMER, QUANTITATIVE: D-Dimer, Quant: 0.47 ug{FEU}/mL (ref 0.00–0.50)

## 2023-08-25 NOTE — Progress Notes (Signed)
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 Diagnosed of COVID-19 infection in 2/14 admitted for hypoxia. Inflammatory markers elevated therefore placed on baricitinib, steroids, bronchodilators.   Assessment & Plan:   Principal Problem:   COVID-19  Acute hypoxia secondary to COVID-19 infection Interstitial pulmonary fibrosis - Patient still remains hypoxic.   - CT PE protocol with background peripheral interstitial accentuation from UIP with peripheral honeycombing - additional indistinctness of pulm vasculature as well as abnormal central interstitial accentuation (edema vs atypical pneumonia) - nodularity along R lung base and L mid lung (warrants follow up to resolution) - Continue Decadron, baricitinib,bronchodilators, I-S/flutter valve.   - s/p lasix for elevated BNP -> net negative, will monitor - inflammatory markers downtrending - d dimer corrects for age - wean o2 as tolerated  COVID-19 Labs  Recent Labs    08/22/23 1654 08/24/23 0257 08/24/23 0701 08/25/23 0904  DDIMER 0.82*  --  0.59* 0.47  CRP  --  7.6*  --  2.5*    Lab Results  Component Value Date   SARSCOV2NAA POSITIVE (Shaquelle Hernon) 08/22/2023   SARSCOV2NAA NEGATIVE 10/27/2021   SARSCOV2NAA NEGATIVE 10/18/2020   SARSCOV2NAA NEGATIVE 09/30/2019   Hypocalcemia/hypophosphatemia - As needed repletion   Acute on chronic diastolic CHF, EF 81% - not clinically overloaded - elevated BNP, s/p lasix - follow I/O, daily weights - goal net negative   Hyponatremia, improved -Slowly improving.  Admission sodium 122 >  130   Large B-cell lymphoma/non-Hodgkin's History of prostate cancer, adenocarcinoma -Follows outpatient Dr Myrle Sheng.  Disease is currently in remission.   GERD -PPI   Depression -Continue home Effexor     DVT prophylaxis: lovenox Code Status: full Family Communication: none Disposition:   Status is: Inpatient Remains inpatient appropriate because: need for continued inpatient care   Consultants:  pulm  Procedures:  none  Antimicrobials:  Anti-infectives (From admission, onward)    Start     Dose/Rate Route Frequency Ordered Stop   08/23/23 1000  remdesivir 100 mg in sodium chloride 0.9 % 100 mL IVPB  Status:  Discontinued       Placed in "Followed by" Linked Group   100 mg 200 mL/hr over 30 Minutes Intravenous Daily 08/22/23 1530 08/23/23 0812   08/22/23 1700  remdesivir 200 mg in sodium chloride 0.9% 250 mL IVPB       Placed in "Followed by" Linked Group   200 mg 580 mL/hr over 30 Minutes Intravenous Once 08/22/23 1530 08/22/23 1800       Subjective: Feeling Zacharia Sowles little better  Objective: Vitals:   08/24/23 2327 08/25/23 0403 08/25/23 0938 08/25/23 1340  BP: 122/80 (!) 145/74    Pulse: 76 68    Resp: 14 14    Temp: 98.2 F (36.8 C) 97.7 F (36.5 C)    TempSrc: Oral Oral    SpO2: 96% 93% 98% 95%  Weight:      Height:        Intake/Output Summary (Last 24 hours) at 08/25/2023 1405 Last data filed at 08/25/2023 1015 Gross per 24 hour  Intake 480 ml  Output 2025 ml  Net -1545 ml   Filed Weights   08/22/23 1325  Weight: 77.8 kg    Examination:  General exam: Appears calm and comfortable  Respiratory system: bibasilar crackles  Cardiovascular system: ectopy noted, normal rate Gastrointestinal system: Abdomen is nondistended, soft and nontender. Central nervous system: Alert and oriented. No focal neurological deficits. Extremities: no LEE    Data Reviewed: I have personally reviewed following labs and imaging studies  CBC: Recent Labs   Lab 08/22/23 1020 08/22/23 1024 08/24/23 0257 08/25/23 0904  WBC 7.6  --  5.9 6.4  NEUTROABS 6.8  --   --   --   HGB 10.7* 11.2* 11.0* 11.1*  HCT 30.4* 33.0* 32.7* 32.7*  MCV 86.6  --  89.8 88.9  PLT 158  --  217 238    Basic Metabolic Panel: Recent Labs  Lab 08/22/23 1020 08/22/23 1024 08/22/23 1654 08/24/23 0257 08/25/23 0904  NA 122* 124* 128* 130* 129*  K 3.7 3.7 4.4 4.4 4.3  CL 90*  --  95* 95* 94*  CO2 26  --  25 25 29   GLUCOSE 183*  --  183* 148* 106*  BUN 7*  --  7* 22 22  CREATININE 0.56*  --  0.60* 0.53* 0.59*  CALCIUM 8.0*  --  8.1* 8.5* 8.4*  MG  --   --   --  2.3 2.3  PHOS  --   --   --  2.4*  --     GFR: Estimated Creatinine Clearance: 74.8 mL/min ([place]) (by C-G formula based on SCr of 0.59 mg/dL (L)).  Liver Function Tests: Recent Labs  Lab 08/22/23 1020  AST 23  ALT 13  ALKPHOS 38  BILITOT 0.6  PROT 6.4*  ALBUMIN 3.6    CBG: No results for input(s): "GLUCAP" in the last 168 hours.   Recent Results (from the past 240 hours)  Blood Culture (routine x 2)     Status: None (Preliminary result)   Collection Time: 08/22/23 10:03 AM   Specimen: BLOOD  Result Value Ref Range Status   Specimen Description   Final    BLOOD BLOOD RIGHT FOREARM Performed at [facility], [address], [place], [place] [address]    Special Requests   Final    Blood Culture adequate volume BOTTLES DRAWN AEROBIC AND ANAEROBIC Performed at [facility], [address] [place], [place] [address]    Culture   Final    NO GROWTH 3 DAYS Performed at [facility], [address] [place], [place] [address]    Report Status PENDING  Incomplete  Resp panel by RT-PCR (RSV, Flu Jillayne Witte&B, Covid) Anterior Nasal Swab     Status: Abnormal   Collection Time: 08/22/23 11:03 AM   Specimen: Anterior Nasal Swab  Result Value Ref Range Status   SARS Coronavirus 2 by RT PCR POSITIVE (Vallen Calabrese) NEGATIVE Final    Comment: (NOTE) SARS-CoV-2  target nucleic acids are DETECTED.  The SARS-CoV-2 RNA is generally detectable in upper respiratory specimens during the acute phase of infection. Positive results are indicative of the presence of the identified virus, but do not rule out bacterial infection or co-infection with other pathogens not detected by the test. Clinical correlation with patient history and other diagnostic information is necessary to determine patient infection status. The expected result is Negative.  Fact Sheet for Patients: BloggerCourse.com  Fact Sheet for Healthcare Providers: SeriousBroker.it  This test is not yet approved or cleared by the [place]  FDA and  has been authorized for detection and/or diagnosis of SARS-CoV-2 by FDA under an Emergency Use Authorization (EUA).  This EUA will remain in effect (meaning this test can be used) for the duration of  the COVID-19 declaration under Section 564(b)(1) of the [place] ct, 21 U.S.C. section 360bbb-3(b)(1), unless the authorization is terminated or revoked sooner.     Influenza Treyshon Buchanon by PCR NEGATIVE NEGATIVE Final   Influenza B by PCR NEGATIVE NEGATIVE Final    Comment: (NOTE) The Xpert Xpress SARS-CoV-2/FLU/RSV plus assay is intended as an aid in the diagnosis of influenza from Nasopharyngeal swab specimens and should not be used as Mercer Peifer sole basis for treatment. Nasal washings and aspirates are unacceptable for Xpert Xpress SARS-CoV-2/FLU/RSV testing.  Fact Sheet for Patients: BloggerCourse.com  Fact Sheet for Healthcare Providers: SeriousBroker.it  This test is not yet approved or cleared by the [place] FDA and has been authorized for detection and/or diagnosis of SARS-CoV-2 by FDA under an Emergency Use Authorization (EUA). This EUA will remain in effect (meaning this test can be used) for the duration of the COVID-19 declaration under Section  564(b)(1) of the Act, 21 U.S.C. section 360bbb-3(b)(1), unless the authorization is terminated or revoked.     Resp Syncytial Virus by PCR NEGATIVE NEGATIVE Final    Comment: (NOTE) Fact Sheet for Patients: BloggerCourse.com  Fact Sheet for Healthcare Providers: SeriousBroker.it  This test is not yet approved or cleared by the [place] FDA and has been authorized for detection and/or diagnosis of SARS-CoV-2 by FDA under an Emergency Use Authorization (EUA). This EUA will remain in effect (meaning this test can be used) for the duration of the COVID-19 declaration under Section 564(b)(1) of the Act, 21 U.S.C. section 360bbb-3(b)(1), unless the authorization is terminated or revoked.  Performed at [facility], [address] [place], [place] [address]   MRSA Next Gen by PCR, Nasal     Status: None   Collection Time: 08/22/23  1:24 PM   Specimen: Nasal Mucosa; Nasal Swab  Result Value Ref Range Status   MRSA by PCR Next Gen NOT DETECTED NOT DETECTED Final    Comment: (NOTE) The GeneXpert MRSA Assay (FDA approved for NASAL specimens only), is one component of Hoby Kawai comprehensive MRSA colonization surveillance program. It is not intended to diagnose MRSA infection nor to guide or monitor treatment for MRSA infections. Test performance is not FDA approved in patients less than 15 years old. Performed at [facility], [address], [place], [place] [address]   Blood Culture (routine x 2)     Status: None (Preliminary result)   Collection Time: 08/22/23  2:23 PM   Specimen: BLOOD LEFT ARM  Result Value Ref Range Status   Specimen Description   Final    BLOOD LEFT ARM Performed at [facility], [address], [place], [place] [address]    Special Requests   Final    BOTTLES DRAWN AEROBIC AND ANAEROBIC Blood Culture adequate volume Performed at [facility], [address], [place], [place] [address]    Culture   Final    NO GROWTH 3 DAYS Performed at [facility], [address] [place], [place] [address]    Report Status PENDING  Incomplete         Radiology Studies: No results found.      Scheduled Meds:  aspirin EC  81 mg Oral Daily   baricitinib  4 mg Oral Daily  budesonide (PULMICORT) nebulizer solution  0.5 mg Nebulization BID   cholecalciferol  2,000 Units Oral QPM   dexamethasone (DECADRON) injection  6 mg Intravenous Q24H   enoxaparin (LOVENOX) injection  40 mg Subcutaneous Q24H   ipratropium-albuterol  3 mL Nebulization BID   latanoprost  1 drop Both Eyes QHS   loratadine  10 mg Oral Daily   metoprolol succinate  25 mg Oral BID   Pirfenidone  1 tablet Oral TID WC   sodium chloride flush  3 mL Intravenous Q12H   venlafaxine XR  75 mg Oral Q breakfast   Continuous Infusions:   LOS: 3 days    Time spent: over 30 min    Lacretia Nicks, MD [facility]   To contact the attending provider between 7A-7P or the covering provider during after hours 7P-7A, please log into the web site [email or web address] and access using universal [place] password for that web site. If you do not have the password, please call the hospital operator.  08/25/2023, 2:05 PM

## 2023-08-25 NOTE — Plan of Care (Signed)
   Problem: Education: Goal: Knowledge of risk factors and measures for prevention of condition will improve Outcome: Progressing   Problem: Coping: Goal: Psychosocial and spiritual needs will be supported Outcome: Progressing

## 2023-08-25 NOTE — TOC Progression Note (Signed)
Transition of Care [facility]) - Progression Note   Patient Details  Name: Kenneth [name] MRN: [id number] Date of Birth: Jun 28, 1941  Transition of Care [facility]) CM/SW Contact  Ewing Schlein, LCSW Phone Number: 08/25/2023, 8:44 AM  Clinical Narrative: Patient is currently on 3L/min oxygen. TOC to follow for possible DME needs at discharge.  Barriers to Discharge: Continued Medical Work up  Social Determinants of Health (SDOH) Interventions SDOH Screenings   Food Insecurity: No Food Insecurity (08/22/2023)  Housing: Low Risk  (08/23/2023)  Transportation Needs: No Transportation Needs (08/22/2023)  Utilities: Not At Risk (08/22/2023)  Social Connections: Socially Integrated (08/22/2023)  Tobacco Use: Low Risk  (08/22/2023)   Readmission Risk Interventions    08/23/2023    3:21 PM  Readmission Risk Prevention Plan  Transportation Screening Complete  PCP or Specialist Appt within 5-7 Days Complete  Home Care Screening Complete  Medication Review (RN CM) Complete

## 2023-08-26 ENCOUNTER — Other Ambulatory Visit (HOSPITAL_COMMUNITY): Payer: Self-pay

## 2023-08-26 DIAGNOSIS — U071 COVID-19: Secondary | ICD-10-CM | POA: Diagnosis not present

## 2023-08-26 LAB — BASIC METABOLIC PANEL
Anion gap: 8 (ref 5–15)
BUN: 25 mg/dL — ABNORMAL HIGH (ref 8–23)
CO2: 28 mmol/L (ref 22–32)
Calcium: 8.8 mg/dL — ABNORMAL LOW (ref 8.9–10.3)
Chloride: 94 mmol/L — ABNORMAL LOW (ref 98–111)
Creatinine, Ser: 0.7 mg/dL (ref 0.61–1.24)
GFR, Estimated: >60 mL/min (ref >60)
Glucose, Bld: 121 mg/dL — ABNORMAL HIGH (ref 70–99)
Potassium: 5.2 mmol/L — ABNORMAL HIGH (ref 3.5–5.1)
Sodium: 130 mmol/L — ABNORMAL LOW (ref 135–145)

## 2023-08-26 LAB — CBC
HCT: 35.6 % — ABNORMAL LOW (ref 39.0–52.0)
Hemoglobin: 11.8 g/dL — ABNORMAL LOW (ref 13.0–17.0)
MCH: 30.2 pg (ref 26.0–34.0)
MCHC: 33.1 g/dL (ref 30.0–36.0)
MCV: 91 fL (ref 80.0–100.0)
Platelets: 260 10*3/uL (ref 150–400)
RBC: 3.91 MIL/uL — ABNORMAL LOW (ref 4.22–5.81)
RDW: 13.8 % (ref 11.5–15.5)
WBC: 6.5 10*3/uL (ref 4.0–10.5)
nRBC: 0 % (ref 0.0–0.2)

## 2023-08-26 LAB — C-REACTIVE PROTEIN: CRP: 1.5 mg/dL — ABNORMAL HIGH (ref <1.0)

## 2023-08-26 LAB — MAGNESIUM: Magnesium: 2.4 mg/dL (ref 1.7–2.4)

## 2023-08-26 MED ORDER — DEXAMETHASONE 6 MG PO TABS
6.0000 mg | ORAL_TABLET | Freq: Every day | ORAL | 0 refills | Status: AC
  Filled 2023-08-26: qty 5, 5d supply, fill #0

## 2023-08-26 MED ORDER — ALBUTEROL SULFATE HFA 108 (90 BASE) MCG/ACT IN AERS
2.0000 | INHALATION_SPRAY | Freq: Four times a day (QID) | RESPIRATORY_TRACT | 2 refills | Status: AC | PRN
  Filled 2023-08-26: qty 6.7, 25d supply, fill #0

## 2023-08-26 NOTE — Plan of Care (Signed)
Problem: Education: Goal: Knowledge of risk factors and measures for prevention of condition will improve 08/26/2023 1521 by Westley Foots, RN Outcome: Adequate for Discharge 08/26/2023 1110 by Westley Foots, RN Outcome: Progressing   Problem: Coping: Goal: Psychosocial and spiritual needs will be supported 08/26/2023 1521 by Westley Foots, RN Outcome: Adequate for Discharge 08/26/2023 1110 by Westley Foots, RN Outcome: Progressing   Problem: Respiratory: Goal: Will maintain a patent airway 08/26/2023 1521 by Westley Foots, RN Outcome: Adequate for Discharge 08/26/2023 1110 by Westley Foots, RN Outcome: Progressing Goal: Complications related to the disease process, condition or treatment will be avoided or minimized 08/26/2023 1521 by Westley Foots, RN Outcome: Adequate for Discharge 08/26/2023 1110 by Westley Foots, RN Outcome: Progressing   Problem: Education: Goal: Knowledge of General Education information will improve Description: Including pain rating scale, medication(s)/side effects and non-pharmacologic comfort measures 08/26/2023 1521 by Westley Foots, RN Outcome: Adequate for Discharge 08/26/2023 1110 by Westley Foots, RN Outcome: Progressing   Problem: Health Behavior/Discharge Planning: Goal: Ability to manage health-related needs will improve 08/26/2023 1521 by Westley Foots, RN Outcome: Adequate for Discharge 08/26/2023 1110 by Westley Foots, RN Outcome: Progressing   Problem: Clinical Measurements: Goal: Ability to maintain clinical measurements within normal limits will improve 08/26/2023 1521 by Westley Foots, RN Outcome: Adequate for Discharge 08/26/2023 1110 by Westley Foots, RN Outcome: Progressing Goal: Will remain free from infection 08/26/2023 1521 by Westley Foots, RN Outcome: Adequate for Discharge 08/26/2023 1110 by Westley Foots, RN Outcome:  Progressing Goal: Diagnostic test results will improve 08/26/2023 1521 by Westley Foots, RN Outcome: Adequate for Discharge 08/26/2023 1110 by Westley Foots, RN Outcome: Progressing Goal: Respiratory complications will improve 08/26/2023 1521 by Westley Foots, RN Outcome: Adequate for Discharge 08/26/2023 1110 by Westley Foots, RN Outcome: Progressing Goal: Cardiovascular complication will be avoided 08/26/2023 1521 by Westley Foots, RN Outcome: Adequate for Discharge 08/26/2023 1110 by Westley Foots, RN Outcome: Progressing   Problem: Activity: Goal: Risk for activity intolerance will decrease 08/26/2023 1521 by Westley Foots, RN Outcome: Adequate for Discharge 08/26/2023 1110 by Westley Foots, RN Outcome: Progressing   Problem: Nutrition: Goal: Adequate nutrition will be maintained 08/26/2023 1521 by Westley Foots, RN Outcome: Adequate for Discharge 08/26/2023 1110 by Westley Foots, RN Outcome: Progressing   Problem: Coping: Goal: Level of anxiety will decrease 08/26/2023 1521 by Westley Foots, RN Outcome: Adequate for Discharge 08/26/2023 1110 by Westley Foots, RN Outcome: Progressing   Problem: Elimination: Goal: Will not experience complications related to bowel motility 08/26/2023 1521 by Westley Foots, RN Outcome: Adequate for Discharge 08/26/2023 1110 by Westley Foots, RN Outcome: Progressing Goal: Will not experience complications related to urinary retention 08/26/2023 1521 by Westley Foots, RN Outcome: Adequate for Discharge 08/26/2023 1110 by Westley Foots, RN Outcome: Progressing   Problem: Pain Managment: Goal: General experience of comfort will improve and/or be controlled 08/26/2023 1521 by Westley Foots, RN Outcome: Adequate for Discharge 08/26/2023 1110 by Westley Foots, RN Outcome: Progressing   Problem: Safety: Goal: Ability to  remain free from injury will improve 08/26/2023 1521 by Westley Foots, RN Outcome: Adequate for Discharge 08/26/2023 1110 by Westley Foots, RN Outcome: Progressing   Problem: Skin Integrity: Goal: Risk for impaired skin integrity will decrease 08/26/2023 1521 by Westley Foots, RN Outcome: Adequate for Discharge 08/26/2023 1110 by Acquanetta Belling  M, RN Outcome: Progressing

## 2023-08-26 NOTE — Progress Notes (Addendum)
SATURATION QUALIFICATIONS: (This note is used to comply with regulatory documentation for home oxygen)  Patient Saturations on Room Air at Rest = 94%  Patient Saturations on Room Air while Ambulating = 88%  Patient Saturations on 2 Liters of oxygen while Ambulating = 94%  Please briefly explain why patient needs home oxygen:  Patient walked on room air and after about 3 minutes dropped to 88, SOB and increased WOB. Once back in chair, was holding around 89% until applied 1-2 L [place], then came back up quickly to 95%.

## 2023-08-26 NOTE — Progress Notes (Signed)
Saturation note/check:  Patient walked on room air and after about 3 minutes dropped to 88, SOB and increased WOB. Once back in chair, was holding aroun 89% until applied 1 L [place], then came back up quickly to 95%.

## 2023-08-26 NOTE — Progress Notes (Signed)
TOC meds from [facility] outpatient pharmacy delivered

## 2023-08-26 NOTE — TOC Transition Note (Signed)
Transition of Care [facility]) - Discharge Note  Patient Details  Name: Kenneth [name] MRN: [id number] Date of Birth: 11-18-1940  Transition of Care [facility]) CM/SW Contact:  Ewing Schlein, LCSW Phone Number: 08/26/2023, 1:02 PM  Clinical Narrative: Patient will need to discharge back to [facility] on home oxygen. Patient does not have a DME company preference, but would like to be evaluated for a POC after discharge. CSW made home oxygen referral to [facility] with [employer]. [employer] to deliver travel tank to patient's room. CSW updated treatment team and patient. TOC signing off.   Final next level of care: Home/Self Care ([facility]) Barriers to Discharge: Barriers Resolved  Patient Goals and CMS Choice Patient states their goals for this hospitalization and ongoing recovery are:: Return home CMS Medicare.gov Compare Post Acute Care list provided to:: Patient Choice offered to / list presented to : Patient  Discharge Plan and Services Additional resources added to the After Visit Summary for          DME Arranged: Oxygen DME Agency: [employer] Date DME Agency Contacted: 08/26/23 Time DME Agency Contacted: [phone] Representative spoke with at DME Agency: Marthann Schiller  Social Drivers of Health (SDOH) Interventions SDOH Screenings   Food Insecurity: No Food Insecurity (08/22/2023)  Housing: Low Risk  (08/23/2023)  Transportation Needs: No Transportation Needs (08/22/2023)  Utilities: Not At Risk (08/22/2023)  Social Connections: Socially Integrated (08/22/2023)  Tobacco Use: Low Risk  (08/22/2023)   Readmission Risk Interventions    08/23/2023    3:21 PM  Readmission Risk Prevention Plan  Transportation Screening Complete  PCP or Specialist Appt within 5-7 Days Complete  Home Care Screening Complete  Medication Review (RN CM) Complete

## 2023-08-26 NOTE — Plan of Care (Signed)

## 2023-08-26 NOTE — Discharge Summary (Signed)
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 Diagnosed of COVID-19 infection in 2/14 admitted for hypoxia. Inflammatory markers elevated therefore placed on baricitinib, steroids, bronchodilators.   He's improved today, on RA at rest and requiring 2 L with activity.  Will d/c with plan for outpatient follow up.    See below for additional details.  Hospital Course:  Assessment and Plan:  Acute hypoxia secondary to COVID-19 infection Interstitial pulmonary fibrosis - on RA at discharge with need for supplemental O2 (2 L) with activity - CXR with background peripheral interstitial accentuation from UIP with peripheral honeycombing - additional indistinctness of pulm vasculature as well as abnormal central interstitial accentuation (edema vs atypical pneumonia) - nodularity along R lung base and L mid lung (warrants follow up to resolution) - s/p baricitinib.  Will complete 10 day course of decadron.  Will prescribe albuterol.  I-S/flutter valve.   - s/p lasix for elevated BNP -> net negative, will monitor - inflammatory markers downtrending - d dimer corrects for age - wean o2 as tolerated    COVID-19 Labs  Recent Labs    08/24/23 0257 08/24/23 0701 08/25/23 0904 08/26/23 0405  DDIMER  --  0.59* 0.47  --   CRP 7.6*  --  2.5* 1.5*    Lab Results  Component Value Date   SARSCOV2NAA POSITIVE (Lakai Moree) 08/22/2023   SARSCOV2NAA NEGATIVE 10/27/2021   SARSCOV2NAA NEGATIVE 10/18/2020   SARSCOV2NAA NEGATIVE 09/30/2019    Hypocalcemia/hypophosphatemia - As needed repletion   Hyperkalemia Mild, follow outpatient  Acute on chronic diastolic CHF, EF 95% - not clinically overloaded - appears euvolemic now - elevated BNP, s/p lasix - follow I/O, daily weights - goal net negative (net negative 5.3 L at discharge)   Hyponatremia, improved -Slowly improving.  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  Disease is currently in remission.   GERD -PPI   Depression -Continue home Effexor    Procedures: none   Consultations: pulmonology  Discharge Exam: Vitals:   08/26/23 1217 08/26/23 1224  BP:  115/83  Pulse:  72  Resp:  18  Temp:  97.6 F (36.4 C)  SpO2: 95% 99%   Feels better today Discussed d/c plan  General: No acute distress. Sitting up in chair. Cardiovascular: RRR - ectopy Lungs: rales greatest at R base - unlabored on RA Neurological: Alert and oriented 3. Moves all extremities 4 with equal strength. Cranial nerves II through XII grossly intact. Extremities: No clubbing or cyanosis. No edema.  Discharge Instructions   Discharge Instructions     Call MD for:  difficulty breathing, headache or visual disturbances   Complete by: As directed    Call MD for:  extreme fatigue   Complete by: As directed    Call MD for:  hives   Complete by: As directed    Call MD for:  persistant dizziness or light-headedness   Complete by: As directed    Call MD for:  persistant nausea and vomiting   Complete by: As directed    Call MD  for:  redness, tenderness, or signs of infection (pain, swelling, redness, odor or green/yellow discharge around incision site)   Complete by: As directed    Call MD for:  severe uncontrolled pain   Complete by: As directed    Call MD for:  temperature >100.4   Complete by: As directed    Diet - low sodium heart healthy   Complete by: As directed    Discharge instructions   Complete by: As directed    You were seen for COVID 19 infection.  You've improved with steroids, olumiant, and supportive care.  We'll send you home with another 5 days of steroids.  Take prilosec daily while you're taking steroids.  I'll also send you with albuterol.    You should get repeat chest imaging with your PCP or pulmonologist as an outpatient to follow up the abnormal findings seen during this hospitalization (questionable nodularity along right lung base and left mid lung).  Your potassium was mildly elevated today.  You should have repeat labs within 1 week with your PCP or pulmonologist.  You'll need oxygen with activity and sleep.  Use 2 liters with activity and at night.  Return for new, recurrent, or worsening symptoms.  Please ask your PCP to request records from this hospitalization so they know what was done and what the next steps will be.   Increase activity slowly   Complete by: As directed       Allergies as of 08/26/2023   No Known Allergies      Medication List     STOP taking these medications    oseltamivir 75 MG capsule Commonly known as: TAMIFLU   Paxlovid (300/100) 20 x 150 MG & 10 x 100MG  Tbpk Generic drug: nirmatrelvir/ritonavir       TAKE these medications    acetaminophen 500 MG tablet Commonly known as: TYLENOL Take 1,000 mg by mouth every 6 (six) hours as needed for moderate pain.   acetaminophen-codeine 300-30 MG tablet Commonly known as: TYLENOL #3 Take 1 tablet by mouth every 4 (four) hours as needed (for pain).   albuterol 108 (90 Base) MCG/ACT  inhaler Commonly known as: VENTOLIN HFA Inhale 2 puffs into the lungs every 6 (six) hours as needed for wheezing or shortness of breath.   Align 4 MG Caps Take 4 mg by mouth daily.   AQUAPHOR ADV PROTECT HEALING EX Apply 1 Application topically daily as needed (itching).   aspirin EC 81 MG tablet Take 81 mg by mouth daily. Swallow whole.   CO-ENZYME Q-10 PO Take 1 tablet by mouth every evening.   denosumab 60 MG/ML Sosy injection Commonly known as: PROLIA Inject 60 mg into the skin every 6 (six) months. At [facility]   desvenlafaxine 50 MG 24 hr tablet Commonly known as: PRISTIQ Take 50 mg by mouth daily.   dexamethasone 6 MG tablet Commonly known as: DECADRON Take 1 tablet (6 mg total) by mouth daily for 5 days. Take prilosec while you're taking steroids. Start taking on: August 27, 2023   diclofenac 75 MG EC tablet Commonly known as: VOLTAREN Take 75 mg by mouth  2 (two) times daily as needed (pain.).   diclofenac sodium 1 % Gel Commonly known as: VOLTAREN Apply 4 g topically 4 (four) times daily as needed (pain.).   dicyclomine 10 MG capsule Commonly known as: BENTYL Take one by mouth every 6 hours as needed for abdominal cramping   diphenoxylate-atropine 2.5-0.025 MG tablet Commonly known as: LOMOTIL Take 1 tablet by mouth daily.   Glucosamine Sulfate 500 MG Tabs Take 500 mg by mouth daily.   HYDROcodone-acetaminophen 10-325 MG tablet Commonly known as: NORCO Take 1 tablet by mouth See admin instructions. Take 1 tablet by mouth every 4-6 hours as needed for pain What changed: Another medication with the same name was removed. Continue taking this medication, and follow the directions you see here.   Investigational - Study Medication Inhale 12 puffs into the lungs See admin instructions. Dr. Kalman Shan: SaTitle:Castiel Lauricella Randomized, Double-blind, Placebo-controlled, Phase 3 Study of the Efficacy and Safety of Inhaled Treprostinil in Subjects with  Idiopathic Pulmonary Fibrosis Dose and Duration of Treatment:Treprostinil for Inhalation 0.6 mg/mL, or placebo (randomly assigned 1:1) over [place] 52 week period.  Protocol # RIN-PF-301; IND # [id number]; Clinical Trials.gov Identifier: [id number]  Inhale 12 puffs into the lungs 4 times Garnell Phenix day   latanoprost 0.005 % ophthalmic solution Commonly known as: XALATAN Place 1 drop into both eyes at bedtime.   loratadine 10 MG tablet Commonly known as: CLARITIN Take 10 mg by mouth daily.   meclizine 25 MG tablet Commonly known as: ANTIVERT Take 25 mg by mouth 3 (three) times daily as needed for dizziness.   metoprolol succinate 25 MG 24 hr tablet Commonly known as: Toprol XL Take 1 tablet (25 mg total) by mouth 2 (two) times daily.   multivitamin tablet Take 1 tablet by mouth daily with breakfast.   omeprazole 40 MG capsule Commonly known as: PRILOSEC TAKE 1 CAPSULE (40 MG TOTAL) BY MOUTH DAILY. What changed:  when to take this reasons to take this   ondansetron 8 MG tablet Commonly known as: ZOFRAN TAKE 1 TABLET BY MOUTH EVERY 8 HOURS AS NEEDED FOR NAUSEA AND VOMITING What changed: See the new instructions.   Pirfenidone 801 MG Tabs Commonly known as: Esbriet Take 1 tablet (801 mg total) by mouth 3 (three) times daily with meals.   Refresh Tears 0.5 % Soln Generic drug: carboxymethylcellulose Place 1 drop into both eyes daily as needed (dry eyes).   Repatha 140 MG/ML Sosy Generic drug: Evolocumab Inject 140 mg into the skin every 30 (thirty) days.   Sodium Fluoride 5000 Plus 1.1 % Crea dental cream Generic drug: sodium fluoride Place 1 Application onto teeth at bedtime.   Soma 250 MG tablet Generic drug: carisoprodol Take 175-350 mg by mouth 3 (three) times daily as needed (for muscle spasms).   Vitamin B12 1000 MCG Tbcr Take 1,000 mcg by mouth daily.   Vitamin D-3 25 MCG (1000 UT) Caps Take 2,000 Units by mouth every evening.   Xhance 93 MCG/ACT Exhu Generic drug:  Fluticasone Propionate Place 1 spray into both nostrils 2 (two) times daily as needed (for sinus issues).               Durable Medical Equipment  (From admission, onward)           Start     Ordered   08/26/23 1254  For home use only DME oxygen  Once       Comments: SATURATION QUALIFICATIONS: (This note is used to comply with regulatory documentation  for home oxygen)   Patient Saturations on Room Air at Rest = 94%   Patient Saturations on Room Air while Ambulating = 88%   Patient Saturations on 2 Liters of oxygen while Ambulating = 94%   Please briefly explain why patient needs home oxygen:   Needs o2 to maintain sats over 88% with activity  POC eval 1-5L pulse dose, dispense if qualifies  Question Answer Comment  Length of Need 6 Months   Mode or (Route) Nasal cannula   Liters per Minute 2   Frequency Continuous (stationary and portable oxygen unit needed)   Oxygen conserving device Yes   Oxygen delivery system Gas      08/26/23 1253           No Known Allergies  Follow-up Information     [facility] Follow up.   Why: [facility] will provide home oxygen after discharge.                 The results of significant diagnostics from this hospitalization (including imaging, microbiology, ancillary and laboratory) are listed below for reference.    Significant Diagnostic Studies: DG Chest Port 1 View Result Date: 08/22/2023 CLINICAL DATA:  Shortness of breath, recent COVID diagnosis. Possible sepsis. 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 Superimposed upon this there appears to be additional indistinctness of the pulmonary vasculature as well as abnormal central interstitial accentuation which could be from edema or atypical pneumonia. Mild enlargement of the cardiopericardial silhouette. No  substantial blunting of the costophrenic angles. Mildly elevated right hemidiaphragm. Atherosclerotic calcification of the aortic arch. Thoracic spondylosis. Questionable nodularity along the right lung base and left mid lung, follow up imaging to ensure resolution is likely warranted. IMPRESSION: 1. 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 2. Superimposed upon this there appears to be additional indistinctness of the pulmonary vasculature as well as abnormal central interstitial accentuation which could be from edema or atypical pneumonia. 3. Mild enlargement of the cardiopericardial silhouette. 4. Questionable nodularity along the right lung base and left mid lung, follow up imaging to ensure resolution is likely warranted. 5. Aortic Atherosclerosis (ICD10-I70.0). Electronically Signed   By: Gaylyn Rong M.D.   On: 08/22/2023 10:05    Microbiology: Recent Results (from the past 240 hours)  Blood Culture (routine x 2)     Status: None (Preliminary result)   Collection Time: 08/22/23 10:03 AM   Specimen: BLOOD  Result Value Ref Range Status   Specimen Description   Final    BLOOD BLOOD RIGHT FOREARM Performed at [facility], [address] [place], [place] [address]    Special Requests   Final    Blood Culture adequate volume BOTTLES DRAWN AEROBIC AND ANAEROBIC Performed at [facility], [address] [place], [place] [address]    Culture   Final    NO GROWTH 4 DAYS Performed at [facility], [address], [place], [place] [address]    Report Status PENDING  Incomplete  Resp panel by RT-PCR (RSV, Flu Maykel Reitter&B, Covid) Anterior Nasal Swab     Status: Abnormal   Collection Time: 08/22/23 11:03 AM   Specimen: Anterior Nasal Swab  Result Value Ref Range Status   SARS Coronavirus 2 by RT PCR POSITIVE (Thomes Burak) NEGATIVE Final    Comment: (NOTE) SARS-CoV-2 target nucleic acids are DETECTED.  The  SARS-CoV-2 RNA is  generally detectable in upper respiratory specimens during the acute phase of infection. Positive results are indicative of the presence of the identified virus, but do not rule out bacterial infection or co-infection with other pathogens not detected by the test. Clinical correlation with patient history and other diagnostic information is necessary to determine patient infection status. The expected result is Negative.  Fact Sheet for Patients: BloggerCourse.com  Fact Sheet for Healthcare Providers: SeriousBroker.it  This test is not yet approved or cleared by the [place] FDA and  has been authorized for detection and/or diagnosis of SARS-CoV-2 by FDA under an Emergency Use Authorization (EUA).  This EUA will remain in effect (meaning this test can be used) for the duration of  the COVID-19 declaration under Section 564(b)(1) of the [place], 21 U.S.C. section 360bbb-3(b)(1), unless the authorization is terminated or revoked sooner.     Influenza Misa Fedorko by PCR NEGATIVE NEGATIVE Final   Influenza B by PCR NEGATIVE NEGATIVE Final    Comment: (NOTE) The Xpert Xpress SARS-CoV-2/FLU/RSV plus assay is intended as an aid in the diagnosis of influenza from Nasopharyngeal swab specimens and should not be used as Zhana Jeangilles sole basis for treatment. Nasal washings and aspirates are unacceptable for Xpert Xpress SARS-CoV-2/FLU/RSV testing.  Fact Sheet for Patients: BloggerCourse.com  Fact Sheet for Healthcare Providers: SeriousBroker.it  This test is not yet approved or cleared by the [place] FDA and has been authorized for detection and/or diagnosis of SARS-CoV-2 by FDA under an Emergency Use Authorization (EUA). This EUA will remain in effect (meaning this test can be used) for the duration of the COVID-19 declaration under Section 564(b)(1) of the Act, 21 U.S.C. section  360bbb-3(b)(1), unless the authorization is terminated or revoked.     Resp Syncytial Virus by PCR NEGATIVE NEGATIVE Final    Comment: (NOTE) Fact Sheet for Patients: BloggerCourse.com  Fact Sheet for Healthcare Providers: SeriousBroker.it  This test is not yet approved or cleared by the [place] FDA and has been authorized for detection and/or diagnosis of SARS-CoV-2 by FDA under an Emergency Use Authorization (EUA). This EUA will remain in effect (meaning this test can be used) for the duration of the COVID-19 declaration under Section 564(b)(1) of the Act, 21 U.S.C. section 360bbb-3(b)(1), unless the authorization is terminated or revoked.  Performed at [facility], [address] [place], [place] [address]   MRSA Next Gen by PCR, Nasal     Status: None   Collection Time: 08/22/23  1:24 PM   Specimen: Nasal Mucosa; Nasal Swab  Result Value Ref Range Status   MRSA by PCR Next Gen NOT DETECTED NOT DETECTED Final    Comment: (NOTE) The GeneXpert MRSA Assay (FDA approved for NASAL specimens only), is one component of Leighann Amadon comprehensive MRSA colonization surveillance program. It is not intended to diagnose MRSA infection nor to guide or monitor treatment for MRSA infections. Test performance is not FDA approved in patients less than 19 years old. Performed at [facility], [address], [place], [place] [address]   Blood Culture (routine x 2)     Status: None (Preliminary result)   Collection Time: 08/22/23  2:23 PM   Specimen: BLOOD LEFT ARM  Result Value Ref Range Status   Specimen Description   Final    BLOOD LEFT ARM Performed at [facility], [address], [place], [place] [address]    Special Requests   Final    BOTTLES DRAWN AEROBIC AND ANAEROBIC Blood Culture  adequate volume Performed at [facility], [address], [place], [place] [address]     Culture   Final    NO GROWTH 4 DAYS Performed at [facility], [address], [name], [place] [address]    Report Status PENDING  Incomplete     Labs: Basic Metabolic Panel: Recent Labs  Lab 08/22/23 1020 08/22/23 1024 08/22/23 1654 08/24/23 0257 08/25/23 0904 08/26/23 0405  NA 122* 124* 128* 130* 129* 130*  K 3.7 3.7 4.4 4.4 4.3 5.2*  CL 90*  --  95* 95* 94* 94*  CO2 26  --  25 25 29 28   GLUCOSE 183*  --  183* 148* 106* 121*  BUN 7*  --  7* 22 22 25*  CREATININE 0.56*  --  0.60* 0.53* 0.59* 0.70  CALCIUM 8.0*  --  8.1* 8.5* 8.4* 8.8*  MG  --   --   --  2.3 2.3 2.4  PHOS  --   --   --  2.4*  --   --    Liver Function Tests: Recent Labs  Lab 08/22/23 1020  AST 23  ALT 13  ALKPHOS 38  BILITOT 0.6  PROT 6.4*  ALBUMIN 3.6   No results for input(s): "LIPASE", "AMYLASE" in the last 168 hours. No results for input(s): "AMMONIA" in the last 168 hours. CBC: Recent Labs  Lab 08/22/23 1020 08/22/23 1024 08/24/23 0257 08/25/23 0904 08/26/23 0405  WBC 7.6  --  5.9 6.4 6.5  NEUTROABS 6.8  --   --   --   --   HGB 10.7* 11.2* 11.0* 11.1* 11.8*  HCT 30.4* 33.0* 32.7* 32.7* 35.6*  MCV 86.6  --  89.8 88.9 91.0  PLT 158  --  217 238 260   Cardiac Enzymes: No results for input(s): "CKTOTAL", "CKMB", "CKMBINDEX", "TROPONINI" in the last 168 hours. BNP: BNP (last 3 results) Recent Labs    12/01/22 1619 08/22/23 1020  BNP 86.0 581.7*    ProBNP (last 3 results) No results for input(s): "PROBNP" in the last 8760 hours.  CBG: No results for input(s): "GLUCAP" in the last 168 hours.     Signed:  Lacretia Nicks MD.  [facility] 08/26/2023, 2:23 PM

## 2023-08-27 ENCOUNTER — Telehealth: Payer: Self-pay | Admitting: Internal Medicine

## 2023-08-27 DIAGNOSIS — U071 COVID-19: Secondary | ICD-10-CM | POA: Diagnosis not present

## 2023-08-27 DIAGNOSIS — J9601 Acute respiratory failure with hypoxia: Secondary | ICD-10-CM | POA: Diagnosis not present

## 2023-08-27 LAB — CULTURE, BLOOD (ROUTINE X 2)
Culture: NO GROWTH
Special Requests: ADEQUATE
Special Requests: ADEQUATE

## 2023-08-27 NOTE — Telephone Encounter (Signed)
YEs 8:30 am 09/16/23 is fine

## 2023-08-27 NOTE — Telephone Encounter (Signed)
You nor any of the Nps have an appointment for 2 weeks. Can I book the patient in your 8:30 slot of March 13?

## 2023-08-27 NOTE — Telephone Encounter (Signed)
Just discharged frm hosptial after covid resp failure  Plan  - give soc visi with APP or MR < 2 weeks . H ehas a research visit next week but that cannot count.  But ok to be seen < 2 week.Face to Face

## 2023-08-28 ENCOUNTER — Other Ambulatory Visit: Payer: Self-pay | Admitting: Internal Medicine

## 2023-09-01 ENCOUNTER — Telehealth: Payer: Self-pay | Admitting: Internal Medicine

## 2023-09-01 DIAGNOSIS — U071 COVID-19: Secondary | ICD-10-CM | POA: Diagnosis not present

## 2023-09-01 DIAGNOSIS — K219 Gastro-esophageal reflux disease without esophagitis: Secondary | ICD-10-CM | POA: Diagnosis not present

## 2023-09-01 DIAGNOSIS — I5033 Acute on chronic diastolic (congestive) heart failure: Secondary | ICD-10-CM | POA: Diagnosis not present

## 2023-09-01 DIAGNOSIS — C8510 Unspecified B-cell lymphoma, unspecified site: Secondary | ICD-10-CM | POA: Diagnosis not present

## 2023-09-01 DIAGNOSIS — E875 Hyperkalemia: Secondary | ICD-10-CM | POA: Diagnosis not present

## 2023-09-01 DIAGNOSIS — D649 Anemia, unspecified: Secondary | ICD-10-CM | POA: Diagnosis not present

## 2023-09-01 DIAGNOSIS — J8410 Pulmonary fibrosis, unspecified: Secondary | ICD-10-CM | POA: Diagnosis not present

## 2023-09-01 DIAGNOSIS — E871 Hypo-osmolality and hyponatremia: Secondary | ICD-10-CM | POA: Diagnosis not present

## 2023-09-01 DIAGNOSIS — F32A Depression, unspecified: Secondary | ICD-10-CM | POA: Diagnosis not present

## 2023-09-01 DIAGNOSIS — J9601 Acute respiratory failure with hypoxia: Secondary | ICD-10-CM | POA: Diagnosis not present

## 2023-09-01 NOTE — Telephone Encounter (Signed)
 Title:A Randomized, Double-blind, Placebo-controlled, Phase 3 Study of the Efficacy and Safety of Inhaled Treprostinil in Subjects with Idiopathic Pulmonary Fibrosis   Dose and Duration of Treatment:Treprostinil for Inhalation 0.6 mg/mL, or placebo (randomly assigned 1:1) over a 52 week period.  Protocol # RIN-PF-301; IND # [id number]; Clinical Trials.gov Identifier: [id number]  CRC, Orma Flaming called patient [id number],EP Reesor on 01 Sep 2023 at 10:30 AM to remind him of his upcoming appointment scheduled for 02 Sep 2023 at 9 am. Patient stated he is not feeling well and will be unable to attend tomorrow's appointment. He said he met with his PCP today 01 Sep 2023 and he was told  that his chest is congested post-COVID. Patient expressed uncertainty about continuing with the trial, stating, "I'm not sure I can manage to be on trial, but for now I can't make to the visit tomorrow." Patient said he doesn't mind a call from the Principal Investigator (PI),Dr Ramaswamy,if possible.  Routing this note to PI.

## 2023-09-02 ENCOUNTER — Encounter: Payer: PPO | Admitting: Internal Medicine

## 2023-09-03 NOTE — Telephone Encounter (Signed)
   PI OVERSIGHT ATTESTATION  I the Principal Investigator (PI) for the above mentioned study attest that I reviewed the mentioned  clinical research coordinator  notes on research subject  Kenneth [name]  born 03-May-1941 .   Called subject t [name]  (DOB May 16, 1941 )  - he was too exhaused to make vvisit yesterday.  But 09/03/2023 -he is feeling much better today 09/03/2023 . Feels stronger and sounded normal on phoine. Saw PCP Rodrigo Ran, MD  Says blood work better. Will do research visit next week he says. He says he is qualified for portable o2 now  - study drug is on  hold till and that is fine til next OV   Dr.Oluwasemilore Bahl Marchelle Gearing, MD Pulmonary and Critical Care Medicine Research Investigator & Staff Physician [facility] and [facility]  [place] [facility] Pager: [phone], If no answer or between  15:00h - 7:00h: call [phone]  09/03/2023 5:31 PM

## 2023-09-06 ENCOUNTER — Ambulatory Visit (HOSPITAL_COMMUNITY)
Admission: RE | Admit: 2023-09-06 | Discharge: 2023-09-06 | Disposition: A | Discharge status: Home / Self Care | Payer: PPO | Source: Ambulatory Visit | Attending: Cardiology | Admitting: Cardiology

## 2023-09-06 DIAGNOSIS — I5022 Chronic systolic (congestive) heart failure: Secondary | ICD-10-CM | POA: Insufficient documentation

## 2023-09-06 DIAGNOSIS — E785 Hyperlipidemia, unspecified: Secondary | ICD-10-CM | POA: Diagnosis not present

## 2023-09-06 DIAGNOSIS — I509 Heart failure, unspecified: Secondary | ICD-10-CM | POA: Diagnosis present

## 2023-09-06 DIAGNOSIS — R0609 Other forms of dyspnea: Secondary | ICD-10-CM | POA: Insufficient documentation

## 2023-09-06 DIAGNOSIS — I77810 Thoracic aortic ectasia: Secondary | ICD-10-CM | POA: Diagnosis not present

## 2023-09-06 LAB — ECHOCARDIOGRAM COMPLETE
AR max vel: 2.13 cm2
AV Area VTI: 2.05 cm2
AV Area mean vel: 1.95 cm2
AV Mean grad: 7.8 mmHg
AV Peak grad: 14.5 mmHg
Ao pk vel: 1.91 m/s
Area-P 1/2: 2.04 cm2
Calc EF: 65.5 %
MV VTI: 2.75 cm2
S' Lateral: 2.6 cm
Single Plane A2C EF: 66.2 %
Single Plane A4C EF: 66.3 %

## 2023-09-06 NOTE — Progress Notes (Signed)
  Echocardiogram 2D Echocardiogram has been performed.  Ocie Doyne RDCS 09/06/2023, 9:34 AM

## 2023-09-07 ENCOUNTER — Telehealth (HOSPITAL_COMMUNITY): Payer: Self-pay | Admitting: *Deleted

## 2023-09-07 NOTE — Telephone Encounter (Signed)
 Called patient per Dr. Shirlee Latch with following echo results:  "No more than mild aortic stenosis, normal EF."  Pt verbalized understanding of same. No further questions at this time.

## 2023-09-08 ENCOUNTER — Encounter

## 2023-09-08 DIAGNOSIS — Z006 Encounter for examination for normal comparison and control in clinical research program: Secondary | ICD-10-CM

## 2023-09-08 DIAGNOSIS — J84112 Idiopathic pulmonary fibrosis: Secondary | ICD-10-CM

## 2023-09-08 NOTE — Research (Addendum)
 Title:A Randomized, Double-blind, Placebo-controlled, Phase 3 Study of the Efficacy and Safety of Inhaled Treprostinil in Subjects with Idiopathic Pulmonary Fibrosis   Dose and Duration of Treatment:Treprostinil for Inhalation 0.6 mg/mL, or placebo (randomly assigned 1:1) over a 52 week period.  Protocol # RIN-PF-301; IND # [id number]; Clinical Trials.gov Identifier: [id number]  Sponsor: [employer]Research [place], [place] [address]   [facility] @ [facility] Clinical Research Coordinator note:   This visit for [name] with DOB: September 21, 1940 on 09/08/2023 for the above protocol is Visit/Encounter # unscheduled visit  and is for purpose of research.   The consent for this encounter is under Protocol Version Amendment 4 dated: 26Jul2023 IB: Version 19 dated 08Jul2024 ICF: Ver. 3.0, 05 Feb 2022, WCG Ver. 3.1, 02 Apr 2022, IRB APPROVED AS MODIFIED Apr 07, 2022 Lab Manual: V4 dated 07Jun2023 (IS) currently IRB approved.  Patient had an SAE and post recovery PI wanted to do unscheduled visit. Patient was not willing to come for week 16 visit as they said they are still recovering and can't do the assessments. Patient hasn't yet started taking the study drug though CRC has asked the patient to start when possible and let the CRC know. On the instruction of PI,Murali Ramaswamy patient was scheduled for unscheduled visit to do physical examination. Dr Alwyn Ren was available to the exam. Further details in the binder.  [name]  (Subject/LAR) expressed continued interest and consent in continuing as a study subject. Subject confirmed that there was  No  (NO/YES) change in contact information (e.g. address, telephone, email). Subject thanked for participation in research and contribution to science. In this visit 09/08/2023 the subject will be evaluated by [facility]) named Dr Marga Melnick. This research coordinator has verified that the above investigator is up to date with  his/her training logs.   The Subject was informed that the PI Kalman Shan, continues to have oversight of the subject's visits and course through relevant discussions, reviews, and also specifically of this visit by routing of this note to the PI.   Signed by  Neita Garnet  Clinical Research Coordinator  [facility]  [place], [place] 1:50 PM 09/08/2023

## 2023-09-09 ENCOUNTER — Encounter

## 2023-09-09 NOTE — Progress Notes (Signed)
 Kenneth A.  [name], DOB December 05, 1940, was seen as subject in a clinical trial /Protocol # RIN-PF-301 Cardiopulmonary symptoms are now relatively stable.  He incurred acute COVID February 14 after travel to [place], necessitating hospitalization 2/16 - 08/26/2023 for acute hypoxic respiratory failure.  He received baricitinib, steroids, and bronchodilators.  Nasal oxygen 2 L with activity was prescribed.  This has been complicated by residual fatigue as well as marked sinus congestion.  He denies fever, nasal purulence, or sinus pain to suggest rhinosinusitis but describes persistent congestion.  He has not been on the study drug because of the intervening infection and travel. Pertinent physical findings include: Nares are markedly dry and erythematous without purulence.  He has hearing aids bilaterally.  Both S1 and S2 are accentuated.  He has a grade 1 systolic murmur at the right base.  He has coarse rales in the lower two thirds of the posterior thorax, greater on the right than the left. All physical findings NCS. A nasal hygiene protocol was discussed with him to include intranasal fluticasone.  He was to consult his PCP should he have fever, sinus pain, or purulent nasal secretions.                                                                                                               Pecola Lawless MD,SI

## 2023-09-16 ENCOUNTER — Ambulatory Visit: Payer: PPO | Admitting: Internal Medicine

## 2023-09-16 ENCOUNTER — Other Ambulatory Visit (INDEPENDENT_AMBULATORY_CARE_PROVIDER_SITE_OTHER)

## 2023-09-16 ENCOUNTER — Telehealth (HOSPITAL_COMMUNITY): Payer: Self-pay | Admitting: Cardiology

## 2023-09-16 ENCOUNTER — Encounter: Payer: Self-pay | Admitting: Internal Medicine

## 2023-09-16 ENCOUNTER — Telehealth: Payer: Self-pay | Admitting: Internal Medicine

## 2023-09-16 ENCOUNTER — Ambulatory Visit

## 2023-09-16 VITALS — BP 108/62 | HR 96 | Temp 97.6°F | Ht 70.0 in | Wt 167.6 lb

## 2023-09-16 DIAGNOSIS — I517 Cardiomegaly: Secondary | ICD-10-CM | POA: Diagnosis not present

## 2023-09-16 DIAGNOSIS — Z8709 Personal history of other diseases of the respiratory system: Secondary | ICD-10-CM

## 2023-09-16 DIAGNOSIS — J84112 Idiopathic pulmonary fibrosis: Secondary | ICD-10-CM

## 2023-09-16 DIAGNOSIS — R5383 Other fatigue: Secondary | ICD-10-CM

## 2023-09-16 DIAGNOSIS — Z5181 Encounter for therapeutic drug level monitoring: Secondary | ICD-10-CM

## 2023-09-16 DIAGNOSIS — E871 Hypo-osmolality and hyponatremia: Secondary | ICD-10-CM

## 2023-09-16 DIAGNOSIS — J8410 Pulmonary fibrosis, unspecified: Secondary | ICD-10-CM | POA: Diagnosis not present

## 2023-09-16 DIAGNOSIS — Z09 Encounter for follow-up examination after completed treatment for conditions other than malignant neoplasm: Secondary | ICD-10-CM

## 2023-09-16 DIAGNOSIS — C8330 Diffuse large B-cell lymphoma, unspecified site: Secondary | ICD-10-CM | POA: Diagnosis not present

## 2023-09-16 DIAGNOSIS — Z8616 Personal history of COVID-19: Secondary | ICD-10-CM

## 2023-09-16 LAB — CBC
HCT: 38.1 % — ABNORMAL LOW (ref 39.0–52.0)
Hemoglobin: 12.7 g/dL — ABNORMAL LOW (ref 13.0–17.0)
MCHC: 33.3 g/dL (ref 30.0–36.0)
MCV: 91.3 fl (ref 78.0–100.0)
Platelets: 264 10*3/uL (ref 150.0–400.0)
RBC: 4.18 Mil/uL — ABNORMAL LOW (ref 4.22–5.81)
RDW: 15.8 % — ABNORMAL HIGH (ref 11.5–15.5)
WBC: 5.7 10*3/uL (ref 4.0–10.5)

## 2023-09-16 LAB — COMPREHENSIVE METABOLIC PANEL
ALT: 40 U/L (ref 0–53)
AST: 21 U/L (ref 0–37)
Albumin: 4.3 g/dL (ref 3.5–5.2)
Alkaline Phosphatase: 45 U/L (ref 39–117)
BUN: 16 mg/dL (ref 6–23)
CO2: 30 meq/L (ref 19–32)
Calcium: 9.4 mg/dL (ref 8.4–10.5)
Chloride: 96 meq/L (ref 96–112)
Creatinine, Ser: 0.79 mg/dL (ref 0.40–1.50)
GFR: 83.1 mL/min (ref >60.00)
Glucose, Bld: 136 mg/dL — ABNORMAL HIGH (ref 70–99)
Potassium: 4.5 meq/L (ref 3.5–5.1)
Sodium: 133 meq/L — ABNORMAL LOW (ref 135–145)
Total Bilirubin: 0.4 mg/dL (ref 0.2–1.2)
Total Protein: 6.9 g/dL (ref 6.0–8.3)

## 2023-09-16 NOTE — Telephone Encounter (Signed)
 Title:A Randomized, Double-blind, Placebo-controlled, Phase 3 Study of the Efficacy and Safety of Inhaled Treprostinil in Subjects with Idiopathic Pulmonary Fibrosis   Dose and Duration of Treatment:Treprostinil for Inhalation 0.6 mg/mL, or placebo (randomly assigned 1:1) over a 52 week period.  Protocol # RIN-PF-301; IND # [id number]; Clinical Trials.gov Identifier: [id number]    On PI, Dr Jane Canary instructions CRC, Yui Mulvaney met Dr [name] in the clinic around 9 AM on 16 Sep 2023, to reschedule his week 16 out of window visit.   Patient has been rescheduled for 23 Sep 2023 at  9 AM. On  PI 's instructions CRC asked the patient to restart the study drug from 3 breaths again and follow the same course as explained to him. CRC asked the patient to call incase of any questions. Last study drug taken was on 20 Aug 2023 as stated by the patient.

## 2023-09-16 NOTE — Patient Instructions (Addendum)
 History of acute respiratory failure with COVID-19 mid February 2025 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HCC) Encounter for therapeutic drug monitoring   -You have improved significantly after COVID admission. -Noticed that you are continuing pirfenidone except for some fatigue associated with it -Noticed that inhaled treprostinil versus placebo research drug is currently on hold since mid February 2025 following admission for COVID-19   -Oxygen needs have significantly improved after admission   Plan  - continue pirfenidone at full dose - monitor pulse ox even with exertion - goal >= 88%  -Check liver function test 09/16/2023 -Meet with research coordinator to restart study drug and restart study scheduled visits -Continue oxygen but he only needed with exertion past 300 feet -Retest overnight pulse oximetry on room air to see if you still need night oxygen -Check chest x-ray today post COVID   Fatigue - significant  in 2023 and on 01/04/2023 and on 09/16/2023 after covid Low sodium in the hospital February 2025   -symptoms are due to esbriet and medical issues  and covid Feb 2025 with low Sodium  Plan - cotninue esbriet but manage symptoms as outlined before - check cbc and bmet 09/16/2023     Followup -Set up research visits - 3 months - 30 min visit with Dr Marchelle Gearing   - symptom score and walk test at followup  - can cancel if needed if research data going well

## 2023-09-16 NOTE — Addendum Note (Signed)
 Addended by: Clearnce Sorrel on: 09/16/2023 03:15 PM   Modules accepted: Orders

## 2023-09-16 NOTE — Telephone Encounter (Signed)
 Patient called to request a returned call from provider  Reports after recent COVID diagnosis has noticed elevated HR with exertion (130's), would like to discuss increase in metoprolol.  -please call patient directly or we can have him schedule ov to discuss in great detail

## 2023-09-16 NOTE — Telephone Encounter (Signed)
 With HR running that high, would have him see me in office.

## 2023-09-16 NOTE — Progress Notes (Addendum)
 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 He tells me that this summer 2018*noticing insidious onset of shortness of breath particularly with walking stairs in the house or [place]. This was not that in the previous years. The summer 2018 he was in [place] but did not notice much shortness of breath. Otherwise he is able to do his activities of daily living and played golf with a car. He is more bothered by his back pain and sciatica as a result of previous back surgery. Shortness of breath is extremely mild. He says he became more aware of it after the radiologic investigations documented below. He had a chest x-ray that suggested interstitial findings and therefore he underwent a high-resolution CT scan of the chest that is described below. I personally visualized this high resolution CT chest and to me it shows bilateral bibasal subpleural reticulation that also extends to the upper lobes without any zonal predominance. There is no obvious honeycombing but there is traction bronchiectasis. There no mediastinal adenopathy. Therefore he is been referred here. In terms of exposure history other than radiation exposure he has not been on any pulmonary toxic drugs or any mold exposure or asbestos exposure. Does have GERD and is on PPI and is well ocntrolled   Does have spring and perennial allergies - sneezes easy. Allergy test with [facility] negative some years ago  [employer] chest physicians interstitial lung disease questionnaire: He says that he does not cough. He is only trouble with dyspnea with strenuous exercise and it started 4 months ago. Past medical history significant for acid reflux. He has never smoked any tobacco or street drugs. Does not have any family history of lung disease.  At home he does not have any humidifier sound birds heart the water damage or mold. 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 He has standard radiation exposure while at work. The standard radiation for a radiologist. He does not take any pulmonary toxic drugs. House is 82 years old and he worries about mold. He sneezes if air is forced   Pulmonary function test shows isolated reduction in diffusion capacity to 62%. Walking desaturation test underneath her feet 3 laps on room air: Resting pulse ox 96%. Final pulse ox 95%. Heart rate resting was 92/m and went up to 109 minute.   Results for SHAW, DOBEK (MRN [id number]) as of 03/23/2017 10:04  Ref. Range 03/04/2017 09:13  FVC-Pre Latest Units: L 3.59  FVC-%Pred-Pre Latest Units: % 86  Results for LYON, DUMONT (MRN [id number]) as of 03/23/2017 10:04  Ref. Range 03/04/2017 09:13  DLCO cor Latest Units: ml/min/mmHg 20.06  DLCO cor % pred Latest Units: % 63    Lungs/Pleura: There is subpleural reticulation with scattered ground-glass and traction bronchiolectasis, without a definite zonal predominance. No definitive honeycombing. Findings appear mildly progressive when radiographs dating back to 12/28/2008 are reviewed. Probable 5 mm subpleural lymph node along the right major fissure. No pleural fluid. Airway is unremarkable.      IMPRESSION: 1. Pulmonary parenchymal findings of interstitial lung disease which may be due to fibrotic nonspecific interstitial pneumonitis or, given slight progression over time, usual interstitial pneumonitis. 2. Aortic atherosclerosis (ICD10-170.0). Coronary artery calcification. 3. Aortic aneurysm NOS (  ICD10-I71.9). 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 Recommend annual imaging followup by CTA or MRA. This recommendation follows 2010 ACCF/AHA/AATS/ACR/ASA/SCA/SCAI/SIR/STS/SVM Guidelines for the Diagnosis and Management of Patients with Thoracic  Aortic Disease. Circulation. 2010; 121: [id number]. 4. Prominence of the right and left main pulmonary arteries can be seen with pulmonary arterial hypertension.     Electronically Signed   By: Leanna Battles M.D.   On: 02/17/2017 13:41      OV 03/23/2017  Chief Complaint  Patient presents with   Follow-up    SOB on exertion. Other than that he has been doing about the same. Denies any cough or CP.   Here to discuss results. Wife [name] here. 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 In inteirm no new symptoms.  Autoimmune profile negative. They raise possibility of 2nd opinion.  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 Progression is based on CXR comparison since 2010; there is no CT and is only suspicion of progression OV 05/06/2017  Chief Complaint  Patient presents with   Follow-up    Follow up today after beginning Esbriet. Pt has no c/o SOB, cough, or CP. Has had some mild nausea from the Esbriet but other than that, he has been doing good on it.   ollow-up idiopathic pulmonary fibrosis mild severity  He is here to follow-up He is on the third week of his bed at 3 pills 3 times a day max dose. So far is tolerating it fine other than mild nausea. He does take after meals. He plans to meet with [facility] coordinator for medication support. He did visit [place] this past weekend and did have vomiting especially after consuming few etoh drinks at a [employer]. He does apply sunscreen [his daughter is a Armed forces operational officer in town]. He still complains about the fact that and is 82 year old home when the air conditioner comes on and they air blows out of the duct system he does sneeze 10 times and his feet does feel cold. She plans to put hepa filters that are high and and also get the duct cleaning. He says he did have allergy evaluation and found few years ago and it was negative. Recently he has  increased his rpinrole and alsogot an injection for his back and he does feel better   OV 06/22/2017  Chief Complaint  Patient presents with   Follow-up    Pt still taking Esbriet and has been doing good except has been having issues with nausea x3 days. PFT done today. Still becomes SOB with exertion. Denies any complaints of cough or CP.   IPF followup  Routine followup. Now on esbriet 3 pills tid since end oct 2018. Having new nausea - moderate, x 3 days. Might be chills. But no associated diarrhea or other side effects. Spacing esbriet 4h only. Also has complaitns about high co pay. Unable to find charity. Doing exrcise with trainer - feeels that is better and more vigorous than rehab. Gets tachy with eercise but says he does not desaturate. Had PFT - stable. Lot of questions about disease    Walking desaturation test on 06/22/2017 185 feet x 3 laps on ROOM AIR:  did NOT desaturate. Rest pulse ox was 100%, final pulse ox was 99%. HR response 84/min at rest to 97/min at peak exertion. Patient MONTRELLE EDDINGS  Did not Desaturate < 88% . Kenneth [name] did  not  Desaturated </= 3% points. Ronaldo Miyamoto A Pretlow yes did get tachyardic   OV 09/20/2017  Chief Complaint  Patient presents with   Follow-up    PFT done today.  Pt states he has been doing good. Pt still taking Esbriet and states he is doing good on it.   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  Collier Flowers for IPF follow-up.  Last visit December 2018.  Since then he has been compliant fully with his Pirfenidone (Esbriet).  His nausea resolved with Ginger intake.  However he is having 3-5 pound weight loss and also fatigue towards the end of the day and also low appetite.  He is exercising vigorously 5 times a week and he is wondering if the fatigue could be because of the heavy exercise.  He is not interested in lung transplantation.  He is open to research participation in interventional trial.  Currently he is on the IPF registry program.  Overall he is  well.  He did go to [place] to visit his son and he did not wear sunscreen and he did pick up a stage I skin burn with erythema that was more than usual.  But this resolved.  I did remind him about his obligation to wear sunscreen at all times with Pirfenidone (Esbriet).  He is interested in interventional research trials.  His pulmonary function test to me on an average is stable even though there is decline in FVC that seems to be an increase in DLCO so that is variability.  His walking desaturation test is around the same. He is interested in rolling over to the larger capsule of the Pirfenidone (Esbriet) so we will have to take it only 3 times a day. [facility]berit is cposting him $500/month or more due to high AGI and this is frustrating for him   Walking desaturation test on 09/20/2017 185 feet x 3 laps on ROOM AIR:  did walk briskly all 3 laps with only mild dyspnea desaturate. Rest pulse ox was 98%, final pulse ox was 98%. HR response 96/min at rest to 107/min at peak exertion. Patient KORBY RATAY  Did not Desaturate < 88% . Kenneth [name] did not  Desaturated </= 3% points. Martin Majestic Kersting yes did get tachyardic this appears similar to a few months ago   OV 12/07/2017  Chief Complaint  Patient presents with   Follow-up    Pt had pre Cleda Daub and DLCO prior to OV today. Pt has increase of SOB with exertion more with walking up the hill.   Dr. [name] presents for follow-up of his IPF to ILD clinic.  In terms of his IPF he feels stable.  In fact he tells me that if not for the incidental chest x-ray and a subsequent CT scan that picked up pulmonary fibrosis he even to this day will not know that he has IPF.  He only has a mild exertional dyspnea class I which she always believes in the past that it was because of aging.  At this point in time he is on Esbriet 1 big pill 3 times daily at full dose.  He is now applying sunscreen and has not had any further sunburns.  He is following an extremely intense exercise  fitness regimen to improve his cardiac conditioning.  He believes his exertional heart rate and resting heart rate are much improved than before.  He works with a Psychologist, educational at [employer].  His main issue is from Pirfenidone [employer]).  He had some  mild nausea that has now resolved with ginger capsules but he continues to have some amount of fatigue low appetite and some mild weight loss.  He says this is mild and all tolerable.  The fatigue happens after working a lot in the yard and he loves yard work.  He wants to know if he can continue to do yard work.  Recently he did have some sinus congestion and took some steroids and antibiotics for it and he feels better but he still has some sinus fullness.  He had spirometry and simple walking desaturation test and the profile is below and I believe these are stable.  In fact his exertional heart rate is improved compared to previous.  Today he also participated in the IPF registry program.  There is a noninterventional trial.  He is interested in future interventional trials.   OV 03/08/2018  Chief Complaint  Patient presents with   Follow-up    PFT performed today. Pt states he has been doing well except he has been having problems with nausea and dizziness, and loss of appetite.   Kenneth [name] - presents for follow-up. For his IPF. He is on esbruet now. He was tolerating his Pirfenidone (Esbriet) quite well other than minor side effects till last visit. However in thesummer of 2019e's had increased nauseaand also weight loss. He has lost 6 pounds since prior visit. Overall he is lost 11 pounds since September 2018. We started the medication esbeit  anti-fibrotic for him in October 2018. His pants are getting looser.he is also having significant fatigue. Nevertheless he does not want to switch to the of anti-fibrotic ofev because of prior history of irritable bowel syndrome. He says that his baseline irritable bowel syndrome is worse than any side effects  he is having with esbriet especially the fatigue.e is open to a transplant evaluation and consideration of research protocols. His spirometry shows stability with FVC but his DLCO might be declining.Marland Kitchen His simple walk test did not show anychange. He is not noticing any subjective changes in dyspnea on effort tolerance while working out although he does admit to increased dizziness particularly when playing golf. During this time recently has not monitored his pulse ox but in the past and never went below 92%.  Other issues: He had some myalgia despite change in his statin. He will address this with his primary care. 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 Primary care has ordered a follow-up CT scan.This can be combined with a high risk CT scan. I I have reached out to  the thoracic radiology     OV 05/10/2018  Subjective:  Patient ID: Kenneth [name], male , DOB: 12-Apr-1941 , age 47 y.o. , MRN: [id number] , ADDRESS: [address] [place] [address]   05/10/2018 -   Chief Complaint  Patient presents with   Follow-up    review spiro with dlco.  c/o stable doe.  On 801mg  Esbriet TID, notes occ dizziness, loss of appetite.      HPI SHAD LEDVINA 83 y.o. -returns-year follow-up.  Diagnosis made towards the end of 2018.  He has been on Esbriet since then.  His main issues have been weight loss.  He lost 10 pounds of weight at the time of last visit.  At the last visit he weighed 174 pounds in September 2019.  Back in August 2018 he weighed 186 pounds.  All this weight loss happen after he started Pirfenidone (Esbriet).  However he is now stabilized and he continues to be 874 pounds.  He is able to do activities of daily living and exercise but when he does stairs he gets dyspneic.  Overall he feels stable in the last 1 year in terms of his effort tolerance.  His main thing is that he has no appetite.  He has been communicating with the Pirfenidone (Esbriet) supporting about how to  manage his low appetite and occasional nausea.  We discussed the options about taking brief holidays or reduction in dose.  He is willing to try this.  He does take occasional ginger.  Of note he had a transplant referral set up by myself or [facility] but as our injection letter because he was out of network.  He tells me that his insurance company only advised that he would have a higher co-pay so is a little bit puzzled by this.  Since then he said conversation with his primary care physician and he wants to see Dr. Hessie Dibble who is a son-in-law to his friend Dr. Marcy Panning retired Careers adviser.  I know Dr. Glynda Jaeger quite well from an IPF standpoint.  And therefore wholeheartedly supported the idea.  In terms of his lung function testing his FVC shows a decline although he is feeling the same.  On miniature walking test he seems a little more tachycardic but he says he feels the same.  He manages this heart rate with his exercise.  His DLCO with some variability appears stable.  He had a CT angiogram for thoracic aortic aneurysm in September 2019 and this when compared to one year earlier has been reported as stable but the different resolutions.  I offered to do a high-resolution CT chest here but he wants to hold off until he saw Dr. Glynda Jaeger.  OV 08/23/2018  Subjective:  Patient ID: Kenneth [name], male , DOB: 27-Feb-1941 , age 11 y.o. , MRN: [id number] , ADDRESS: [address] [place] [address]   08/23/2018 -   Chief Complaint  Patient presents with   Follow-up    PFT performed today. Pt states he has been doing well since last visit and denies any complaints.   IPF followup"   Diagnosis made towards the end of 2018.  He has been on Esbriet since then.   HPI TYRECE VANTERPOOL 83 y.o. -Dr. Threasa Beards presents for follow-up of his IPF.  He continues to feel good.  His main issue is tolerating the Esbriet which gives some fatigue.  Sometimes he has to postpone it.  In terms of his dyspnea he  continues to exercise.  His dyspnea symptom score is really minimal.  His cough is nonexistent.  He was losing weight with Esbriet but now he is gained his weight back.  In the interim he did see Dr. Hessie Dibble June 23, 2018 at [facility].  He had a satisfactory visit.  He thought he was stable.  His next visit with Dr. Glynda Jaeger will be in June 2020.  Moving forward he wants to alternate every 6 months between myself and Dr. Glynda Jaeger at [facility] which will give him 3 months with 1 pulmonologist or the other.  He is beginning to get interested in research studies.  We did pulmonary function test on him today and is documented below.  The FVC appears a fluctuant but the DLCO definitely appears on a downward trend.  The new DLCO is on a global lung initiated [GL I] equation and therefore the  percent predicted is higher.  But the absolute value showed downward trend line.  We went over this.  He definitely does not feel this subjectively.  His last high-resolution CT scan of the chest was in August of 2018.  He is willing to have one at follow-up.  He wants to see me again in 6 months.  His simple walking desaturation test appears stable.  He is thinking of making a trip to [place] and wants to know if he should wear a mask.    OV 04/21/2019  Subjective:  Patient ID: Kenneth [name], male , DOB: 1941/05/01 , age 64 y.o. , MRN: [id number] , ADDRESS: [address] [place] [address]   04/21/2019 -   Chief Complaint  Patient presents with   Follow-up    Patient reports that his breathing is doing well at this time.    IPF followup. On esbriet  HPI URBAN NAVAL 83 y.o. -presents for follow-up of his idiopathic pulmonary fibrosis.  Last visit was pre-pandemic in February 2018.  In the interim he did see Dr. Hessie Dibble at [facility] and deemed to be stable.  At this point in time he is reporting continued stability with the symptoms as seen by the score below.  However I did notice  that he is lost lot of weight.  His BMI with his clothes on is 25 with a weight of 170 pounds.  He says his dry weight is actually around 162 pounds.  He feels that this is the stable weight in the last 6 months.  He says he is not bothered by it.  He says this is because of low appetite because of the and pirfenidone.  He feels the pirfenidone is benefiting him and he continues to be is to be stable.  In fact his pulmonary function test shows " improvement".  He said that he felt the test today was somewhat variable and is effort based on the technician.  He had a recent high-resolution CT chest that shows continued stability.  I personally visualized this film.  At this point in time he is happy taking the pirfenidone.  But he did agree if there is further weight loss into the 150s pounds then we could reassess  Had a lot of discussion about COVID-19 and about appropriate risk reduction measures which he is continuing to do.  He did some read some literature about COVID-19 and had questions about it. He in Vit D supplementation and is agreeable to get his levels checked following literature about protective effect of Vit D  Other than that he reports 2 episodes of dizziness and in one of those he nearly passed out.  He was wondering if some of the symptoms were related to ropinirole and therefore he has stopped it.  I reviewed the literature with him and the significant amount of dizziness and hypotension.  And also GI symptoms which can probably accentuated with pirfenidone.   OV 10/16/2019  Subjective:  Patient ID: Kenneth [name], male , DOB: 08-29-1940 , age 89 y.o. , MRN: [id number] , ADDRESS: [address] [place] [address]   10/16/2019 -   Chief Complaint  Patient presents with   Follow-up    PFT performed 3/31.  Pt states he is doing okay. States his breathing is about the same since last visit.     HPI IZREAL KOCK 83 y.o.  -follow-up IPF on pirfenidone    -ROS   32-month  follow-up for Dr. [name].  He continues on pirfenidone.  He is taking full dose.  In the interim he has had hernia surgery 6 weeks ago for right inguinal hernia.  This is gone well.  He is change his exercise from bicycle to walking because of the hernia.  His symptom score is the same.  In January 2021 he saw Dr. Glynda Jaeger at [facility] for IPF follow-up.  Deemed to be stable.  I reviewed that note.  After that he has had pulmonary function test with [place] couple weeks ago.  This shows a decline.  He says the quality of instructions was poor and therefore the decline is because of that.  At least that is his suspicion.  He feels well.  Walking desaturation test is stable.  He has gained some weight.  He continues to have some nausea and fatigue and diarrhea with the pirfenidone.  But this is stable and manageable.  He feels his irritable bowel syndrome is active because of the pirfenidone.  He plans to see Dr. Harlene Salts for that.  His last liver function test was with Dr. Glynda Jaeger in January 2021.  This was normal per the note.  His last CT scan was towards the end of 2020 and deemed to be stable.     OV 04/11/2020   Subjective:  Patient ID: Kenneth [name], male , DOB: 03-11-1941, age 33 y.o. years. , MRN: [id number],  ADDRESS: [address] [place] [address] PCP  Rodrigo Ran, MD Providers : Dr Delton See  Treatment Team:  Attending Provider: Kalman Shan, MD   Chief Complaint  Patient presents with   Follow-up    PFT performed today.  Pt states he has been doing well since last visit and denies any complaints.    Follow-up idiopathic pulmonary fibrosis.  Diagnosis towards the end of 2018.  On pirfenidone.  Last CT scan of the chest September 2020.   HPI JABAREE MERCADO 83 y.o. -presents for follow-up last seen in April 2021.  After that in July 2021 he visited with Dr. Hessie Dibble at [facility].  I reviewed the notes.  The feeling was that he was stable.  He is  only minimally symptomatic.  He had a 6-minute walk test and did really well without any significant desaturations.  He is now resorted to high intensity interval training based on advice from a physical therapist/gym instructor.  He feels like stronger and failure.  He also states that his appetite is better and is eating better.  He feels his weight is stable.  In terms of his IPF symptoms is minimal and documented below.  He just has mild irritable bowel syndrome with his Esbriet and baseline health.  His walking desaturation test today in the office is stable.  His pulmonary function test was reviewed.  Over the last 3 years it shows a slight steady decline.  This decline is both in FVC and DLCO.  He is averaging around 2.6% decline per year with FVC while 8% decline in the DLCO per year.  On an average with a 3% decline per year in the last 3 years.  We did discuss this.  We discussed #pulmonary fibrosis foundation support group -he will visit the website.  He gets newsletters from them.  At this point in time he is not interested in joining the support group but he did get the contact information for this and will reach out if he desires so.  #-We  also discussed research as a care option -we discussed trial sponsored by [place] the Child psychotherapist of pirfenidone.  The trial is called [employer].  The investigation medical product is called PRM-151 in the axilla macrophage pathway.  Promising phase 2 data.  Research team gave him the consent form under my delegation.  He understands research is voluntary and is to evaluate unproven therapies.  He understands that the primary intent is to contribute to her scientific development and therapeutic gain is secondary because of an approved product the risks benefit ratio is very different compared to blood products.  He is going to read the consent and think about potential participation.  I sent him via email medical journal articles about the investigational  product    He tells me that he feels his weight is stable.  His dry weight early in the morning is between 162-163 pounds.  I pointed out that his weight today is 168 pounds and is lower than before.  However he says that the difference rating 168-172 is related to clothing and his weight at home is stable.  He is not concerned about the slight weight loss.  Overall while he agrees he is lost weight since starting Esbriet he feels recently has been stable.  He wants to continue to monitor the situation  Irritable bowel: He said he will check with Dr. Marina Goodell about this  Other issues:  - He wants me to check his iron panel -He will do follow-up of his IPF-registry visit today  -for research-  -He has had his Covid booster    OV 10/22/2020  Subjective:  Patient ID: Kenneth [name], male , DOB: 03-24-1941 , age 62 y.o. , MRN: [id number] , ADDRESS: [address] [place] [address] PCP Rodrigo Ran, MD Patient Care Team: Rodrigo Ran, MD as PCP - General (Internal Medicine) Felicita Gage, RN as Oncology Nurse Navigator  This Provider for this visit: Treatment Team:  Attending Provider: Kalman Shan, MD    10/22/2020 -   Chief Complaint  Patient presents with   Follow-up    Fatigue from radiation treatments, more winded and more GI upset from radiation treatments    Follow-up idiopathic pulmonary fibrosis.  Diagnosis towards the end of 2018.  On pirfenidone.  Last CT scan of the chest September 2020.    HPI GREYSIN MEDLEN 83 y.o. -returns for follow-up.  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  He is finished hormonal treatment and now is on radiation treatment.  Radiation treatment is aggravating his GI side effects from Pirfenidone (Esbriet) and from irritable bowel syndrome.  Is also causing fatigue.  He went August a masters in walk the tournament.  This made him more fatigued than what he experienced few years ago.  He said it took him a few days to recover.  He  used to have weight loss but this is now stabilized it is documented below.  His symptom severity score and his walking desaturation test are stable but his pulmonary function test continues to show decline.  His DLCO shows a relatively more decline but he said there were technique issues today.  He is got upcoming appointment with ILD clinic at [facility] in July.  At this point in time he is interested in phase 3 clinical trials.  We have quite a few to off as of summer 2022 but his prostate cancer could be an exclusion.  We will have the research team evaluate.  He is saddened  by the loss of his friend Dr. Samuella Cota.  He is evaluating whether he should join the pulmonary fibrosis foundation at the national level to be a board member.     OV 04/03/2021  Subjective:  Patient ID: Kenneth [name], male , DOB: 11/28/1940 , age 65 y.o. , MRN: [id number] , ADDRESS: [address] [place] [address] PCP Rodrigo Ran, MD Patient Care Team: Rodrigo Ran, MD as PCP - General (Internal Medicine) Felicita Gage, RN as Oncology Nurse Navigator  This Provider for this visit: Treatment Team:  Attending Provider: Kalman Shan, MD    04/03/2021 -   Chief Complaint  Patient presents with   Follow-up    Just performed a partial PFT, patient states increased SOB.    Follow-up idiopathic pulmonary fibrosis.  Diagnosis towards the end of 2018.  On pirfenidone.  Last CT scan of the chest September 2020. - > sept 2022  Underlying irritable bowel syndrome  Initial weight loss with pirfenidone and then stabilized  HPI Kenneth [name] 83 y.o. 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  Since I last saw him he has had several health issues.  He got diagnosed with prostate cancer and then had radiation to that.  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  He is going to have a colonoscopy.  He feels his irritable bowel syndrome pirfenidone and ultimately  the radiation made his GI symptoms significantly worse.  In order to combat the fatigue he has been undergoing physical therapy.  He also try to get back to golf and took a golf swing for practice and then states that he had a compression fracture in the spine.  This is probably also because of years of steroid injections in his back.  In addition when he had a high-resolution CT chest in September 2022 this month there was some neck nodes and he has had a neck CT scan followed by ENT visit to Dr. Jenne Pane.  Apparently fine-needle aspiration was nondiagnostic.  He is scheduled to undergo excision biopsy.  The combination of all this is gotten a little bit more anxious and depressed.  As you can see his symptom scores for depression and anxiety are up.  He is also feeling a little more short of breath than usual.  He says his pulse ox is in the low 90s when he exerts himself but it still adequate.  Review of his symptoms and objective basis shows his symptom score to be the same.  His walking desaturation test is also the same.  His weight is stable.  His pulmonary function test shows stability in diffusion but a decline in FVC compared to earlier this year.  Had a high-resolution CT chest that only shows minimal progression fibrosis over 2 years.  Echocardiogram September 2022 did not show any evidence of pulmonary hypertension     CLINICAL DATA:  Pulmonary fibrosis.   EXAM: CT CHEST WITHOUT CONTRAST   TECHNIQUE: Multidetector CT imaging of the chest was performed following the standard protocol without intravenous contrast. High resolution imaging of the lungs, as well as inspiratory and expiratory imaging, was performed.   COMPARISON:  03/28/2019, 03/30/2018 and 02/27/2017. Bone scan 05/21/2020.   FINDINGS: Cardiovascular: Atherosclerotic calcification of the aorta, aortic valve and coronary arteries. Enlarged pulmonic trunk and right and left main pulmonary arteries. Heart is at the upper  limits of normal in size. No pericardial effusion.   Mediastinum/Nodes: Low left internal  jugular lymph nodes measure up to 8 mm (2/8), previously 3 mm on 03/28/2019. Mediastinal lymph nodes are subcentimeter in short axis size, as before. Hilar regions are difficult to definitively evaluate without IV contrast. Left axillary lymph nodes measure up to 9 mm, are unchanged and not considered enlarged by CT size criteria. High left subpectoral lymph node measures 7 mm (2/11), increased from 4 mm on 03/28/2019. No right axillary adenopathy. Esophagus is grossly unremarkable. Periesophageal lymph nodes are subcentimeter in short axis size and appears similar.   Lungs/Pleura: Peripheral and basilar predominant subpleural reticulation, ground-glass and traction bronchiectasis/bronchiolectasis, minimally progressive from 03/28/2019. Evidence of progression is best seen in the lower lung zones. No pleural fluid. Airway is unremarkable. No air trapping.   Upper Abdomen: Visualized portions of the liver, gallbladder, adrenal glands, kidneys, spleen, pancreas, stomach and bowel are unremarkable.   Musculoskeletal: Degenerative changes in the spine. There is a new small rounded sclerotic lesion in the lateral aspect of the right third rib (sagittal image 32). New lucent lesion in the left posterolateral aspect of the T8 vertebral body measuring approximately 1.3 x 1.7 x 2.1 cm (2/84 and sagittal image 96). This likely corresponds to abnormal uptake seen on bone scan 05/21/2020. Finding is new from 03/28/2019. Slight compression of the T12 superior endplate is new.   IMPRESSION: 1. Slight interval progression of pulmonary fibrosis. Findings are consistent with UIP per consensus guidelines: Diagnosis of Idiopathic Pulmonary Fibrosis: An Official ATS/ERS/JRS/ALAT Clinical Practice Guideline. Am Rosezetta Schlatter Crit Care Med Vol 198, Iss 5, [phone], Mar 06 2017. 2. New small sclerotic lesion in the  lateral aspect of the right third rib, worrisome for metastatic prostate cancer. 3. Lucent lesion in the left posterolateral aspect of the T8 vertebral body corresponds to abnormal uptake on bone scan 05/21/2020, and is new from CT chest 03/28/2019, raising suspicion for malignancy. 4. Left internal and high left subpectoral lymph nodes are subcentimeter in size but appear slightly larger than on 03/28/2019. Difficult to exclude a lymphoproliferative disorder. Please refer to CT neck with contrast done the same day in further evaluation. 5. Slight compression of the T12 superior endplate is new but age indeterminate. 6. Aortic atherosclerosis (ICD10-I70.0). Coronary artery calcification. 7. Enlarged pulmonary arteries, indicative of pulmonary arterial hypertension.     Electronically Signed   By: Leanna Battles M.D.   On: 03/19/2021 15:12   Xxxxx  IMPRESSION: Neck CT September 2022 Mildly prominent lymph nodes in the neck bilaterally left greater than right. Cluster of left supraclavicular and left posterior lymph nodes all under 1 cm.   Largest lymph node in the neck is a right level 3 lymph node measuring 12 mm on axial image 75.     Electronically Signed   By: Marlan Palau M.D.   On: 03/19/2021 15:24   Xxxx IMPRESSIONS echocardiogram September 2022    1. Left ventricular ejection fraction, by estimation, is 65 to 70%. The  left ventricle has normal function. The left ventricle has no regional  wall motion abnormalities. Left ventricular diastolic parameters are  consistent with Grade I diastolic  dysfunction (impaired relaxation).   2. Right ventricular systolic function is normal. The right ventricular  size is normal. Tricuspid regurgitation signal is inadequate for assessing  PA pressure.   3. Left atrial size was mildly dilated.   4. The mitral valve is abnormal. Trivial mitral valve regurgitation. No  evidence of mitral stenosis.   5. The aortic valve is  tricuspid. There is mild calcification  of the  aortic valve. There is moderate thickening of the aortic valve. Aortic  valve regurgitation is not visualized. Mild aortic valve stenosis. Aortic  valve area, by VTI measures 1.62 cm.  Aortic valve mean gradient measures 12.0 mmHg. Aortic valve Vmax measures  2.31 m/s. DI is 0.43.   6. Aortic dilatation noted. There is borderline dilatation of the aortic  root, measuring 39 mm. There is borderline dilatation of the ascending  aorta, measuring 39 mm.   7. The inferior vena cava is normal in size with <50% respiratory  variability, suggesting right atrial pressure of 8 mmHg.   Comparison(s): Changes from prior study are noted. 03/02/2017: LVEF 60-65%,  mild LVH, grade 1 DD, aortic root 39 mm, no aortic stenosis.   OV 06/17/2021  Subjective:  Patient ID: Kenneth [name], male , DOB: April 07, 1941 , age 77 y.o. , MRN: [id number] , ADDRESS: 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     HPI OSIRIS ODRISCOLL 83 y.o. 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  Since his last visit from ILD standpoint he continues to be stable.  His walking desaturation test is stable.  However he is reporting more shortness of breath but this is directly related to more fatigue.  Review of the labs indicate in October 2022 he had mild anemia and also mild hyponatremia.  In the past radiation therapy for prostate made him extremely tired.  He is also on pirfenidone that can cause tiredness.  He now has lymphoma also that can cause tiredness.  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  He seen Dr. Rolm Baptise.  They are planning to start R-CHOP treatment after Christmas.  He is frustrated by the recurrence of various medical illnesses.  The second cancer since his IPF diagnosis.  In terms of his IPF: He is now on generic pirfenidone.  He feels the nausea is worse.  He wants to switch to branded Esbriet.  Gave him donor sample of Esbriet by [employer].  Lot number [id number] with expiration June 2025 [unopened bottle], lot number [id number] with expiration 05/2023 [unopened bottle and also lot number [id number] with expiration 12/2023 [partial bottle].  He is asked me to send a message to the pharmacist to make the switch.  He says even if it is more expensive he will play for the branded Esbriet because it is better quality of life.  He is worried that nausea and other side effects will get worse particularly once he starts chemotherapy.   No results found.   OV 09/18/2021  Subjective:  Patient ID: Kenneth [name], male , DOB: [birth year] , age 65 y.o. , MRN: [id number] , ADDRESS: [address] [place] [address] PCP Rodrigo Ran, MD Patient Care Team: Rodrigo Ran, MD as PCP - General (Internal Medicine) Felicita Gage, RN as Oncology Nurse Navigator [place], Jacquelyne Balint, RN as Oncology Nurse Navigator  This Provider for this visit: Treatment Team:  Attending Provider: Kalman Shan, MD  Follow-up idiopathic pulmonary fibrosis.  Diagnosis towards the end of 2018.  On pirfenidone.  Last CT scan of the chest September 2020. - > sept 2022  Underlying irritable bowel syndrome  Initial weight loss with pirfenidone and then stabilized  History of prostate cancer  New diagnosis of B-cell lymphoma cervical lymphadenopathy -late 2022  Multifactorial fatigue  - mild anemia OCt 2022  - mild hypontermia OCt 2022  09/18/2021 -    Chief Complaint  Patient presents with   Follow-up    PFT performed today.  Pt states he is about the same since last visit. States that he is currently undergoing chemotherapy due to diagnosis of lymphoma.     HPI DIEGO ULBRICHT 83 y.o. -returns for follow-up.  He is taking Esbriet instead of generic pirfenidone.  He is also taking 3 little pills 3 times daily.  He finds that Esbriet to be better than generic pirfenidone.  He finds 3 little pills better than 1 whole pill in terms of nausea.  Still is dealing with a lot of nausea both from the chemo and Esbriet.  He says yesterday was really bad day with nausea.  He does take Zofran but we discussed and said he will try taking the Zofran 30 to 60 minutes before the Esbriet.  Nevertheless he has been able to gain some weight.  He is actually feeling slightly less short of breath.  He is able to do more particularly after his back pain has resolved.  Nevertheless he is on antiprostate cancer therapy.  He is also undergoing chemotherapy from his B-cell lymphoma.  His mood is better.  He says he is socially isolating significantly because of his pulm fibrosis and cancer history.  He wanted to know if he could go out.  He did go to [employer] for show but wore a mask.  I did indicate to him that he could follow the CDC map to see prevalence of respiratory viruses and when the problem is overall low he could take more risk.  Also like being outdoors is less risky.  If he is endorsing particularly a cluster putting a mask on reduces his risk.  But I did say that he could use these guardrails and be more active so he can get some socialization and help his mood.  Can also give him purpose.  He is now sold his house and is planning to move into [facility].  In terms of his walking desaturation test and symptoms he stable.  In terms of his pulmonary function test his DLCO is stable but his FVC is down.  He is feeling similar.  Most of the decline  seems to be from several years ago but most recently appears to be stable overall. We did discuss clinical trials but cancer generally is an exclusion but otherwise he would be interested.  We did discuss potential for doing inhaled treprostinil trial.  We did discuss the fact the neck step and standard of care would be oxygen if he were to decline but so far he has held good.  He recently lost his friend [facility] who also had pulmonary fibrosis.     He has upcoming [employer] appointment but he missed the 1 earlier in the year because of all ischemia.  He is thinking about holding off on going to [facility] and just following here.  I was fine with this.   OV 10/28/2021  Subjective:  Patient ID:  Kenneth [name], male , DOB: December 17, 1940 , age 45 y.o. , MRN: [id number] , ADDRESS: [address] [place] [address] PCP Rodrigo Ran, MD Patient Care Team: Rodrigo Ran, MD as PCP - General (Internal Medicine) Felicita Gage, RN as Oncology Nurse Navigator [place], Jacquelyne Balint, RN as Oncology Nurse Navigator  This Provider for this visit: Treatment Team:  Attending Provider: Kalman Shan, MD    10/28/2021 -   Chief Complaint  Patient presents with   Acute Visit    Pt states since his last chemo treatment, he has been having problems with his breathing and also states that his pulse has been elevated.     Kenneth [name] 83 y.o. -presents with his wife.  Status meeting his wife for the first time but she reminded me that she had made him early on in the course of his illness.  He is finished cycles of R-CHOP treatment for his lymphoma.  On 10/23/2021 Thursday he saw a nurse practitioner in oncology.  At that point it was noticed that he had finished mini CHOP treatment 5 rounds 10/02/2021.  He did complete his 6 cycle.  He was feeling well.  His resting heart rate was 82.  Pulse ox at rest was 100%.  His white count was observed to be slightly low.  They discussed several options and decided to  proceed with chemotherapy followed by [facility] single dose on 10/24/2021 Friday.  He was doing fairly okay then on Sunday, 10/26/2021 he noticed resting tachycardia heart rate 125 but no fever.  At no point any fever.  No cough no chills no nausea no vomiting no diarrhea.  The same thing persisted even on Monday, 10/27/2021.  He noticed that if he walked his heart rate was up to 140 he became short of breath and stop.  His pulse ox would drop to 90% but no abnormal desaturations.  He started getting worried.  He went to [employer] yesterday 10/27/2021.  EKG reviewed sinus tachycardia with some ectopic atrial beats.  Nothing acute.    Labs: Found to have mild hyponatremia.  Also found to have mild anemia white count in response to Udenyca went from low to  high.No D-dimer.  No troponin no BNP checked.  Last echo and CT scan of the chest in September 2022.  Walking desaturation test is definitely more tachycardic than baseline.  Associated with this he is also got exaggerated pulse ox drop although still adequate for simple walking test.  His pace is slower.   We repeated an EKG here: Personally visualized.  Sinus tachycardia heart rate 120 approximately.  Nothing acute.      OV 01/09/2022  Subjective:  Patient ID: Kenneth [name], male , DOB: [birth year] , age 68 y.o. , MRN: [id number] , ADDRESS: [address] [place] [address] [place] [facility] [address] PCP Rodrigo Ran, MD Patient Care Team: Rodrigo Ran, MD as PCP - General (Internal Medicine) Felicita Gage, RN as Oncology Nurse Navigator O'[place], Jacquelyne Balint, RN as Oncology Nurse Navigator  This Provider for this visit: Treatment Team:  Attending Provider: Kalman Shan, MD    01/09/2022 -   Chief Complaint  Patient presents with   Follow-up    PFT performed today. Pt states that he feels like he may be a little worse since last visit. States that he has had some problems with congestion which he wonders could be due to the weather.   HPI RECTOR DEVONSHIRE 83 y.o. 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  At this point in time he is finished his chemotherapy.  In April 2023 he did have some tachycardic response following the last chemo.  Cardiac work-up ended up being fine.  He says overall he is been feeling fine.  He is now relocated to a retirement home in [place].  [place]  During this time his pulse ox immediately after exertion was always in the 90s.  He did not feel that his IPF was getting worse when he was there.  He just came back yesterday.  However for the last few days he is having increased fatigue nasal congestion.  He also feels his left ear is blocked.  He tried to do pulmonary function test today.  He felt he did not give a good effort particularly the DLCO.  Consistent with this his numbers are much worse.  His FVC shows a 7% decline compared to March 2023 and DLCO 14% decline compared to March 2023.  His symptom score seems similar to September 2022.  His walking desaturation test is also stable on this level ground.  His weight loss is stabilized since he is at 168 pounds.  He is frustrated by this.  Is also frustrated by his multiple health issues.  The pirfenidone he is taking the generic version.  He is tolerating it but is requiring Zofran which then causes constipation.  Emotionally he is looking for a break from all his health issues  He plans [place] the [place] today.     01/26/2022: Today - follow up Patient presents today for follow up after being treated for sinusitis. He reports feeling better but still has issues with nasal congestion and pressure in his left ear. They did improve with prednisone taper. These are problems he has been dealing with for a while now. He has tried multiple different nasal sprays with some relief. 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 He is sure this was the result of his worsening pulmonary function testing. He is  working on getting in to see Dr. Jenne Pane with ENT. Otherwise, no concerns today. Breathing is overall stable. He gets winded with stair climbing but otherwise, no issues. Doesn't feel like his activity is limited by his breathing. He's planning to go back [place] the mountains in the next few weeks.     OV 04/07/2022  Subjective:  Patient ID: Kenneth [name], male , DOB: 1941-05-20 , age 13 y.o. , MRN: [id number] , ADDRESS: [address] [place] [address] [place] [facility] [address] PCP Rodrigo Ran, MD Patient Care Team: Rodrigo Ran, MD as PCP - General (Internal Medicine) Felicita Gage, RN as Oncology Nurse Navigator [place], Jacquelyne Balint, RN as Oncology Nurse Navigator  This Provider for this visit: Treatment Team:  Attending Provider: Kalman Shan, MD   04/07/2022 -   Chief Complaint  Patient presents with   Follow-up    Spiro/DLCO done today. Breathing has been gradually worse since the last visit. He has had some muscle fatigue with exertion. He has some occ cough- non prod.      HPI ALEXANDROS EWAN 83 y.o. -returns for follow-up.  He feels he is slowly progressing in terms of his IPF.  In fact his shortness of breath scores are slightly worse.  Last time his pulmonary function test showed a decline he thought was from sinusitis.  Sinusitis is cleared up but he says today when he did  his PFTs he felt that he was declining.  He did have some cough he is having some new cough for the last few weeks.  He is worried this might be from worsening IPF but he typically gets cough this time of the year from fall allergy season.  Pulmonary function test shows an improvement compared to the most recent one but overall compared to earlier this year there is a decline.  I shared these results with him.  He is more worried about his fatigue.  However he did play some golf.  He says that he is significantly fatigued.  He has been going on for 6 to 12 months initially thought was related to cancer and chemotherapy but he says it  is the all the time.  The fatigue persist despite anemia correction despite his chemotherapy completing is still got fatigue.  He feels it happens episodically but then it is there all over the body and it exhausting.  He believes his primary care is checked his vitamin D levels because he is on replacement.  He believes his TSH and hemoglobin A1c to be normal.  Apparently primary care physician has asked him to consider getting a CT coronaries.  He has seen Dr. Marca Ancona in the past.  At the time it was from acute chemotherapy consideration.  Dr. Shirlee Latch at the time got an echocardiogram.  He does have enlarged pulmonary artery on the CT scan and he does have coronary artery calcification.  Back then in the spring during chemo side effects patient had deferred right heart catheterization.  This time of reach out to Dr. Shirlee Latch   He is due for an IPF-Pro registry visit today    OV 06/23/2022  Subjective:  Patient ID: Kenneth [name], male , DOB: 11-11-1940 , age 52 y.o. , MRN: [id number] , ADDRESS: [address] [facility] [id number] PCP Rodrigo Ran, MD Patient Care Team: Rodrigo Ran, MD as PCP - General (Internal Medicine) Felicita Gage, RN as Oncology Nurse Navigator O'Kelley, Jacquelyne Balint, RN as Oncology Nurse Navigator  This Provider for this visit: Treatment Team:  Attending Provider: Kalman Shan, MD    06/23/2022 -   Chief Complaint  Patient presents with   Follow-up    Pt states he has been doing okay since last visit.     HPI JARETT DRALLE 83 y.o. 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  Overall he is in a better state of health.  His fatigue is improved.  His shortness of breath is also improved somewhat.  He continues pirfenidone at full dose.  His last chemotherapy was in J May 2023.  His last androgen deprivation treatment was in July 2023.  He believes the chemo and the androgen deprivation was causing his fatigue.  With the passage of time he is better.  He is now living  in [facility].  He is doing daily exercise.  He says that he can go well on a flat ground but when he does uphill sometimes he has to stop but most of the time he is doing well.  He does workout with exercise bike.  He states his tachycardia has improved with more more physical conditioning.  However he does get fatigued and short of breath when he tries to play golf.  His GI issues in combination of irritable bowel syndrome and pirfenidone continue but it is mild level.  He says 2023 has been a rough year and has been emotional.  Therefore primary care physician  started him on Pristiq on the advice of his daughter.  He says since then he somewhat better.  He did have sinusitis a few weeks ago and now he is better.  Take prednisone and cephalosporin.  Social note: We talked about masking particularly with the surgery for respiratory viruses.  On July 25, 2022 he is going to get the Carren [employer]ang award for coverage at [facility].  He is going to talk about pulmonary fibrosis.  I encouraged him to be an advocate for pulmonary fibrosis.  He said in the past is The diagnosis private.  I did tell him that given the serious nature of the disease and rare disease I would encourage him to be as open as he wants to so there is more awareness and advocacy for the disease.  I also advised him to mask for this gathering to the extent possible as a risk reduction against respiratory viruses.    OV 10/13/2022  Subjective:  Patient ID: Kenneth [name], male , DOB: 12/27/1940 , age 33 y.o. , MRN: [id number] , ADDRESS: [address] [facility] [facility] [phone] PCP Rodrigo Ran, MD Patient Care Team: Rodrigo Ran, MD as PCP - General (Internal Medicine) Felicita Gage, RN as Oncology Nurse Navigator O'Kelley, Jacquelyne Balint, RN as Oncology Nurse Navigator  This Provider for this visit: Treatment Team:  Attending Provider: Kalman Shan, MD  10/13/2022 -   Chief Complaint  Patient presents with   Follow-up     F/up     HPI Kenneth [name] 83 y.o. -returns for follow-up.  On 08/11/2022 he underwent elective neck surgery by Dr. Dutch Quint because of cervical spondylosis and radiculopathy.  Since then the pain is resolved.  After this on 08/23/2022 he ended up with tachycardia in the ED.  Pulmonary embolism ruled out.  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  He is on beta-blocker.  He says he is a little more short of breath doing things around the house.  He is also having significant PVCs.  He is on Toprol.  His chronic anemia continues.  The PVC issue is new he is frustrated by this.  He had pulmonary function test today and it shows a definite decline compared to a year ago.  He is also lost weight with full dose pirfenidone.  He continues have significant fatigue.  Is also reporting some new nausea.  I did indicate to him that pirfenidone is the most likely culprit for the trial of weight loss fatigue and nausea.  We discussed taking a holiday with the pirfenidone but we took a shared decision making to reduce the dose and monitor.  In addition for his PVCs need to see if his ILD has gotten worse.  Which it has on the pulmonary function test.  He has agreed to get a overnight pulse oximetry study done.  He is somewhat despondent because of all his medical issues.       OV 01/04/2023  Subjective:  Patient ID: Kenneth [name], male , DOB: 12-Apr-1941 , age 55 y.o. , MRN: [id number] , ADDRESS: [address] [place] [facility] [id number] PCP Rodrigo Ran, MD Patient Care Team: Rodrigo Ran, MD as PCP - General (Internal Medicine) Felicita Gage, RN as Oncology Nurse Navigator O'Kelley, Jacquelyne Balint, RN as Oncology Nurse Navigator  This Provider for this visit: Treatment Team:  Attending Provider: Kalman Shan, MD       01/04/2023 -   Chief Complaint  Patient presents with  Follow-up    F/up on PFT     HPI HERBIE LEHRMANN 83 y.o. -returns for follow-up.  In this visit he reports that after moving to  [facility] and the food quality is good he has gained weight.  His appetite is good.  His weight is stable.  However he does feel that he is declining.  He says symptoms are worse.  In fact the dyspnea symptom score shows that he is slowly declining with more dyspnea.  He is back on full dose pirfenidone.  The lower dose did not improve his fatigue.  We discussed and we agreed that his fatigue is likely multifactorial from the disease other medical issues and medications including Lopressor for PVC.  Today's exercise hypoxemia test was normal.  He is somewhat frustrated but also sanguine about the fact that he is declining.  His pulmonary function test does show decline.  I personally reviewed it.  Last set of labs was in May 2024 with a creatinine of 0.97 mg percent.  Last liver function test was in April 2024 normal.   Encounter therapeutic monitoring with high risk prescription pirfenidone  - Esbriet/Pirfenidone requires intensive drug monitoring due to high concerns for Adverse effects of , including  Drug Induced Liver Injury, significant GI side effects that include but not limited to Diarrhea, Nausea, Vomiting,  and other system side effects that include Fatigue, headaches, weight loss and other side effects such as skin rash. These will be monitored with  blood work such as LFT initially once a month for 6 months and then quarterly  -He will need liver function test today.   Other issues - Did see Dr. Truett Perna 10/06/2022.  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  -We discussed the progression.  We felt the next best option is clinical trials but a lot of the trials excluded patients who have cancer.  Did express that currently 2 trials have high potential for future approval.  This including inhaled treprostinil versus placebo.  The other is Bexotegrast versus placebo.  Did indicate to him we will prescreen him and then decide which 1 he would be suitable for.  He is open to the  idea of clinical trials.  Did communicate with the research coordinator Neita Garnet       OV 05/13/2023  Subjective:  Patient ID: Kenneth [name], male , DOB: May 09, 1941 , age 4 y.o. , MRN: [id number] , ADDRESS: [address] [place] [facility] [phone] PCP Rodrigo Ran, MD Patient Care Team: Rodrigo Ran, MD as PCP - General (Internal Medicine) Felicita Gage, RN as Oncology Nurse Navigator O'Kelley, Jacquelyne Balint, RN as Oncology Nurse Navigator  This Provider for this visit: Treatment Team:  Attending Provider: Kalman Shan, MD    05/13/2023 -   Chief Complaint  Patient presents with   Follow-up    Pt denies any concerns, still on esbriet.    HPI KIEL COCKERELL 83 y.o. -returns for follow-up.  He is now being randomized on inhaled treprostinil versus placebo on the [facility] 301 study.  He is taking the nebulizer 4 times daily and each time 5 times.  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  He has no sore throat no chest pain no headache no dizziness no hemoptysis.  He feels good.  He continues his pirfenidone.  He is maintaining his weight.  His pulmonary function test shows  slight improvement after continued progression.  He is back to being at the level of spring 2024.  He was excited to hear this.  We did tell him that we have no idea if he is on placebo the real drug.  He plans to go to [place].  I did tell him that he needs to take his study medication with him and follow the instructions to the T.  He has had labs with research and I will review those.     PFT  OV 09/16/2023  Subjective:  Patient ID: Kenneth [name], male , DOB: 1940-08-10 , age 89 y.o. , MRN: [id number] , ADDRESS: [address] [place] [facility] [phone] PCP Rodrigo Ran, MD Patient Care Team: Rodrigo Ran, MD as PCP - General (Internal Medicine) Felicita Gage, RN as Oncology Nurse Navigator O'Kelley, Jacquelyne Balint, RN as Oncology Nurse  Navigator  This Provider for this visit: Treatment Team:  Attending Provider: Kalman Shan, MD    Follow-up idiopathic pulmonary fibrosis.  Diagnosis towards the end of 2018.  On pirfenidone.  Last CT scan of the chest September 2020. 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  Depression and started on Pristiq by primary care physician in 2023  Esbriet/Pirfenidone requires intensive drug monitoring due to high concerns for Adverse effects of , including  Drug Induced Liver Injury, significant GI side effects that include but not limited to Diarrhea, Nausea, Vomiting,  and other system side effects that include Fatigue, headaches, weight loss and other side effects such as skin rash. These will be monitored with  blood work such as LFT initially once a month for 6 months and then quarterly   COVID hypoxemic respiratory failure requiring oxygen mid February 2025 and admission to [facility]   09/16/2023 -   Chief Complaint  Patient presents with   Hospitalization  Follow-up    Recently admitted with covid. He feels he developed sinus infection- started cefdinir and this has improved. Breathing is getting better each day.      HPI JKAI ARWOOD 83 y.o. -returns for posthospital follow-up.  Mid February 2025 was hospitalized for COVID-19 requiring oxygen.  Since then he is significantly improved.  He states he is walking around the house without oxygen.  There is a huge improvement because when I spoke to him even a week or 2 after the hospitalization he was in no position team and make the office visit.  He says he still fatigued but he needs physical therapy he did  not want to go to pulmonary rehabilitation because he is working out at the place where he lives.  He says he got excellent care at [facility] from the hospitalist service.  He is worried about a nodule report on his chest x-ray at admission but I visualized it and showed it to him I think is just ILD infiltrates and COVID infiltrates but we took shared decision making to get a chest x-ray today.  With his fatigue I reviewed his labs he is still anemic he also had low sodium so we will recheck these.  Is tolerating pirfenidone well but he needs safety monitoring so we will check liver test.  Given his improvement in oxygen we decided to just uses oxygen with exertion past 300 feet.  Also test overnight pulse oximetry on room air to see if he still needs oxygen at night  He is on research protocol of inhaled treprostinil versus placebo.  He had expressed lack of interest when he was fatigued posthospitalization and taking the study drug or doing the research study visits.  Currently the study drug is on holiday.  I had a conversation with him.  I showed him the preliminary data and shared my anecdotal experience of patients/subjects in the study.  Based on this because it establish safety profile of the inhaled treprostinil he is agreed to continue restarting it and going back on schedule with the research  drug.  He did express inconvenience with the nebulizer schedule but he understands this part of what is required     Last Weight  Most recent update: 09/16/2023  8:41 AM    Weight  76 kg (167 lb 9.6 oz)               SYMPTOM SCALE - ILD 08/23/2018  04/21/2019  10/16/2019  04/11/2020 168# 10/22/2020 170# - esbriet and XRT for prostate 04/03/2021 168# 06/17/2021 168# lymphoma 09/18/2021 172# 01/09/2022 168# 04/07/2022 173# 06/23/2022 171#  10/13/2022 164# esbiret 01/04/2023 164# Esbret full dose 05/13/2023 Esbriet + [facility] 301 study 167# 09/16/2023 167# Covid admit feb 2025  O2 use ra e ra   ra ra ra ra ra ra ra ra ra   Shortness of Breath 0 -> 5 scale with 5 being worst (score 6 If unable to do)                At rest 0 0 0 0 0 0 0 0 0 0  0 0 0 0 0  Simple tasks - showers, clothes change, eating, shaving *0** 0 0 0 0 0 0 0 0 1 0  1 1 1 2   Household (dishes, doing bed, laundry) 0 0 0 0 0 1 1 0 1 1  1 2 3 2 2   Shopping 0 0 0 0 0 0 1 0 1 1  1 2 3 2 3   Walking level at own pace 0 0 0 0 0 0 1 0 1 1  1 2 1 1 3   Walking up Stairs 2 2 2 1 2 2 3 2 3 4 3 4 4 3 5   Total (40 - 48) Dyspnea Score 2 2 2 1 2 3 6 2 6 8 6 11 12 9 15   How bad is your cough? 0 0 0 0 0 0 1 2 0 1  0 0 0 0 0  How bad is your fatigue 1 due to esbriet 0 2 0 2 1 3.5 2 3 3 2  2.5 3 3 3   nausea   1 0  1 1 3  0 1 1 1 2 3 2 1   vomit   0 0 0 0 0 0 0 0 0 0  0 0 0  diarrhea   1 1 3 1 2  0 1 1 1 2 1  0 1  anxiety   0 0 0 1 1 1 2 0 0 1  2 1 1   depresso0   0 0 0 1 1 1 2 0 1 1  2 1 0       Simple office walk 185 feet x  3 laps goal with forehead probe 12/07/2017 Weight 180# 03/08/2018 Weight 174.9 05/10/2018 Weight 174# 08/23/2018 Weight 178# 04/21/2019 Weight 170# 10/16/2019 173# 04/11/2020 168# 10/22/2020 170# 04/03/2021 168# 06/17/2021 168# 09/18/2021  10/28/2021  01/09/2022 168# 06/23/2022 171# 01/04/2023  05/13/2023  09/16/2023   O2 used Room air Room air Room air Room air Room air Room air   ra ra ra  ra ra ra ra ra  Number laps  completed 3 3 3 3 3 3   3  3     Sit stand x 15 Sist stand x 10 Walked 2 of 3 las  Comments about pace Moderate pace Mod pace Mod fast pace fast  fast   fast   Avg pace - slower than before       Resting Pulse Ox/HR 100% and 83/min 100% ad 85/min 100% and 91/min 100% and 82/min  100% asnd 91 100% and 83/min 98% and 94/min 100% and 82 100% and 93 100% and 78 99% and H 126 99% and HR 88 100% ahd HR 87 97% andHR 70 95$ and HR 72 99% and HR 95  Final Pulse Ox/HR 99% and 98/min 98$ and 104/min 98% and 108/min 98% and 100/min  96% and 106/mom 98% and 96/mn 95% and 110.min 97% and 105 97% ad 109 97% and 94 92% and HR 143 97% and HR 111 96% and HR 110 96% and HR 96 92% and HR 92 93% and HR 107  Desaturated </= 88% no no no no  no             Desaturated <= 3% points no no ni no  yesm 4 points  Yes, 3 points Yes, 3 poitn Yes 3 pomnts Yes 3 Yes, 7 points  Yes 4 ponts     Got Tachycardic >/= 90/min yes yes yes yes  yes  yes yes yes yes yes       Symptoms at end of test No complaints No complaints  No complaints  Mild dyspnea  no Mild dyspnea Mild dyspnea Mild dysnea Modeate dyspnea  No complaints Little dyspnea    Miscellaneous comments x ? sligh increase in tachy/stable ? incraeased tachty       Stable since April 2021              PFT     Latest Ref Rng & Units 05/13/2023   12:56 PM 01/04/2023    8:51 AM 10/09/2022    4:21 PM 04/07/2022    1:46 PM 01/09/2022    9:17 AM 09/18/2021    8:52 AM 04/03/2021    9:21 AM  PFT Results  FVC-Pre L 2.37  2.27  2.33  2.53  2.49  2.68  2.96   FVC-Predicted Pre % 64  62  63  68  64  68  75   Pre FEV1/FVC % % 86  84  86  86  87  84  81   FEV1-Pre L 2.03  1.92  2.01  2.18  2.16  2.25  2.38   FEV1-Predicted Pre % 78  74  77  83  78  80  85   DLCO uncorrected ml/min/mmHg 11.71  10.66  11.48  12.36  12.55  13.07  16.15   DLCO UNC% % 51  46  50  53  53  54  67   DLCO corrected ml/min/mmHg 11.71  10.66  12.81  13.57  12.55  14.64  16.15   DLCO COR %Predicted % 51  46  55  59   53  61  67   DLVA Predicted % 81  77  94  89  91  105  91        LAB RESULTS last 96 hours No results found.       has a past medical history of Arthritis, Depression, Diffuse large B cell lymphoma (HCC) (2023), Diverticulosis of colon (without mention of hemorrhage), GERD (gastroesophageal reflux disease), Hiatal hernia, HLD (hyperlipidemia), Internal hemorrhoids without mention of complication, Irritable bowel syndrome, Other specified gastritis without mention of hemorrhage, Prostate cancer (HCC) (2022), Pulmonary fibrosis (HCC), and Stress fracture (02/2021).   reports that he has never smoked. He has never used smokeless tobacco.  Past Surgical History:  Procedure Laterality Date   ANTERIOR CERVICAL DECOMP/DISCECTOMY FUSION N/A 08/11/2022   Procedure: Anterior Cervical Decompression/Discectomy Fusion - Cervical Four-Cervical Five -  Cervical Five-Cervical Six - Cerivcal Six-Cerival Seven;  Surgeon: Julio Sicks, MD;  Location: [facility] OR;  Service: Neurosurgery;  Laterality: N/A;   CARPAL TUNNEL RELEASE     left   CATARACT EXTRACTION  06/2011   bilateral   DEEP NECK LYMPH NODE BIOPSY / EXCISION  03/25/2021   INGUINAL HERNIA REPAIR Right 09/05/2019   Procedure: OPEN REPAIR RIGHT INGUINAL HERNIA WITH MESH;  Surgeon: Darnell Level, MD;  Location: [facility] ORS;  Service: General;  Laterality: Right;   LUMBAR DISC SURGERY  1986, 1995   x 2   LUMBAR LAMINECTOMY/DECOMPRESSION MICRODISCECTOMY Left 07/11/2013   Procedure: LUMBAR ONE TO TWO, LUMBAR TWO TO THREE, LUMBAR THREE TO FOUR LUMBAR LAMINECTOMY/DECOMPRESSION MICRODISCECTOMY 3 LEVELS;  Surgeon: Temple Pacini, MD;  Location: [facility] NEURO ORS;  Service: Neurosurgery;  Laterality: Left;   NASAL SINUS SURGERY     RIGHT/LEFT HEART CATH AND CORONARY ANGIOGRAPHY N/A 04/14/2022   Procedure: RIGHT/LEFT HEART CATH AND CORONARY ANGIOGRAPHY;  Surgeon: Laurey Morale, MD;  Location: [facility] INVASIVE CV LAB;  Service: Cardiovascular;  Laterality: N/A;    No Known  Allergies  Immunization History  Administered Date(s) Administered   Fluad Quad(high Dose 65+) 03/18/2020   Influenza Split 10/03/2009, 03/28/2010, 04/14/2011, 03/22/2012, 03/09/2013, 03/28/2014   Influenza, High Dose Seasonal PF 03/19/2016, 03/06/2017, 04/09/2018, 03/07/2019, 04/06/2020, 04/11/2022   Influenza, Quadrivalent, Recombinant, Inj, Pf 03/12/2018, 03/10/2019, 04/06/2020, 04/07/2023   Influenza,inj,Quad PF,6+ Mos 04/04/2014, 03/26/2015, 04/05/2016   Influenza-Unspecified 04/05/2016, 04/05/2021, 04/25/2023   PFIZER Comirnaty(Gray Top)Covid-19 Tri-Sucrose Vaccine 10/15/2020   PFIZER(Purple Top)SARS-COV-2 Vaccination 07/20/2019, 08/10/2019, 02/27/2020, 03/23/2021   PNEUMOCOCCAL CONJUGATE-20 02/18/2021   Pfizer Covid-19 Vaccine Bivalent Booster 5y-11y 10/05/2022   Pneumococcal Conjugate-13 03/30/2013, 07/31/2013, 04/26/2018   Pneumococcal Polysaccharide-23 10/03/2009, 01/14/2010, 03/31/2018, 02/18/2021   Pneumococcal-Unspecified 04/05/2016   Td 10/20/2016   Tdap 10/03/2009   Unspecified SARS-COV-2 Vaccination 03/21/2023   Zoster Recombinant(Shingrix) 01/03/2017   Zoster, Live 10/03/2009    Family History  Problem Relation Age of Onset   Heart failure Father 52   Bladder Cancer Mother    Breast cancer Mother  Colon cancer Neg Hx    Pancreatic cancer Neg Hx    Rectal cancer Neg Hx    Stomach cancer Neg Hx    Prostate cancer Neg Hx      Current Outpatient Medications:    acetaminophen (TYLENOL) 500 MG tablet, Take 1,000 mg by mouth every 6 (six) hours as needed for moderate pain., Disp: , Rfl:    acetaminophen-codeine (TYLENOL #3) 300-30 MG tablet, Take 1 tablet by mouth every 4 (four) hours as needed (for pain)., Disp: , Rfl:    albuterol (VENTOLIN HFA) 108 (90 Base) MCG/ACT inhaler, Inhale 2 puffs into the lungs every 6 (six) hours as needed for wheezing or shortness of breath., Disp: 6.7 g, Rfl: 2   aspirin EC 81 MG tablet, Take 81 mg by mouth daily. Swallow whole.,  Disp: , Rfl:    carboxymethylcellulose (REFRESH TEARS) 0.5 % SOLN, Place 1 drop into both eyes daily as needed (dry eyes)., Disp: , Rfl:    carisoprodol (SOMA) 250 MG tablet, Take 175-350 mg by mouth 3 (three) times daily as needed (for muscle spasms)., Disp: , Rfl:    cefUROXime (CEFTIN) 250 MG tablet, Take 250 mg by mouth 2 (two) times daily., Disp: , Rfl:    Cholecalciferol (VITAMIN D-3) 1000 UNITS CAPS, Take 2,000 Units by mouth every evening., Disp: , Rfl:    CO-ENZYME Q-10 PO, Take 1 tablet by mouth every evening. , Disp: , Rfl:    Cyanocobalamin (VITAMIN B12) 1000 MCG TBCR, Take 1,000 mcg by mouth daily., Disp: , Rfl:    denosumab (PROLIA) 60 MG/ML SOSY injection, Inject 60 mg into the skin every 6 (six) months. At [facility], Disp: , Rfl:    desvenlafaxine (PRISTIQ) 50 MG 24 hr tablet, Take 50 mg by mouth daily., Disp: , Rfl:    diclofenac (VOLTAREN) 75 MG EC tablet, Take 75 mg by mouth 2 (two) times daily as needed (pain.)., Disp: , Rfl: 2   diclofenac sodium (VOLTAREN) 1 % GEL, Apply 4 g topically 4 (four) times daily as needed (pain.)., Disp: , Rfl: 12   dicyclomine (BENTYL) 10 MG capsule, Take one by mouth every 6 hours as needed for abdominal cramping, Disp: 30 capsule, Rfl: 1   diphenoxylate-atropine (LOMOTIL) 2.5-0.025 MG tablet, Take 1 tablet by mouth daily., Disp: , Rfl:    Emollient (AQUAPHOR ADV PROTECT HEALING EX), Apply 1 Application topically daily as needed (itching)., Disp: , Rfl:    Evolocumab (REPATHA) 140 MG/ML SOSY, Inject 140 mg into the skin every 30 (thirty) days., Disp: , Rfl:    Glucosamine Sulfate 500 MG TABS, Take 500 mg by mouth daily., Disp: , Rfl:    HYDROcodone-acetaminophen (NORCO) 10-325 MG tablet, Take 1 tablet by mouth See admin instructions. Take 1 tablet by mouth every 4-6 hours as needed for pain, Disp: , Rfl:    Investigational - Study Medication, Inhale 12 puffs into the lungs See admin instructions. Dr. Kalman Shan: SaTitle:A  Randomized, Double-blind, Placebo-controlled, Phase 3 Study of the Efficacy and Safety of Inhaled Treprostinil in Subjects with Idiopathic Pulmonary Fibrosis Dose and Duration of Treatment:Treprostinil for Inhalation 0.6 mg/mL, or placebo (randomly assigned 1:1) over a 52 week period.  Protocol # RIN-PF-301; IND # [id number]; Clinical Trials.gov Identifier: NCT0470  Inhale 12 puffs into the lungs 4 times a day, Disp: , Rfl:    latanoprost (XALATAN) 0.005 % ophthalmic solution, Place 1 drop into both eyes at bedtime., Disp: , Rfl:    loratadine (CLARITIN) 10 MG tablet,  Take 10 mg by mouth daily., Disp: , Rfl:    meclizine (ANTIVERT) 25 MG tablet, Take 25 mg by mouth 3 (three) times daily as needed for dizziness., Disp: , Rfl:    metoprolol succinate (TOPROL XL) 25 MG 24 hr tablet, Take 1 tablet (25 mg total) by mouth 2 (two) times daily., Disp: 60 tablet, Rfl: 11   Multiple Vitamin (MULTIVITAMIN) tablet, Take 1 tablet by mouth daily with breakfast., Disp: , Rfl:    omeprazole (PRILOSEC) 40 MG capsule, TAKE 1 CAPSULE (40 MG TOTAL) BY MOUTH DAILY. (Patient taking differently: Take 40 mg by mouth daily as needed (for reflux).), Disp: 90 capsule, Rfl: 0   ondansetron (ZOFRAN) 8 MG tablet, TAKE 1 TABLET BY MOUTH EVERY 8 HOURS AS NEEDED FOR NAUSEA AND VOMITING (Patient taking differently: Take 8 mg by mouth every 8 (eight) hours as needed for nausea or vomiting.), Disp: 30 tablet, Rfl: 1   Pirfenidone (ESBRIET) 801 MG TABS, Take 1 tablet (801 mg total) by mouth 3 (three) times daily with meals., Disp: 270 tablet, Rfl: 1   Probiotic Product (ALIGN) 4 MG CAPS, Take 4 mg by mouth daily., Disp: , Rfl:    SODIUM FLUORIDE 5000 PLUS 1.1 % CREA dental cream, Place 1 Application onto teeth at bedtime., Disp: , Rfl:    XHANCE 93 MCG/ACT EXHU, Place 1 spray into both nostrils 2 (two) times daily as needed (for sinus issues)., Disp: , Rfl:       Objective:   Vitals:   09/16/23 0837  BP: 108/62  Pulse: 96  Temp: 97.6  F (36.4 C)  TempSrc: Oral  SpO2: 99%  Weight: 167 lb 9.6 oz (76 kg)  Height: 5\' 10"  (1.778 m)    Estimated body mass index is 24.05 kg/m as calculated from the following:   Height as of this encounter: 5\' 10"  (1.778 m).   Weight as of this encounter: 167 lb 9.6 oz (76 kg).  @WEIGHTCHANGE @  [employer]   09/16/23 0837  Weight: 167 lb 9.6 oz (76 kg)     Physical Exam   General: No distress. Looks better O2 at rest: no Cane present: no Sitting in wheel chair: no Frail: o Obese: no Neuro: Alert and Oriented x 3. GCS 15. Speech normal Psych: Pleasant Resp:  Barrel Chest - nono.  Wheeze - no, Crackles - YES R > L base, No overt respiratory distress CVS: Normal heart sounds. Murmurs - no Ext: Stigmata of Connective Tissue Disease - no HEENT: Normal upper airway. PEERL +. No post nasal drip        Assessment:       ICD-10-CM   1. History of 2019 novel coronavirus disease (COVID-19)  Z86.16 CBC w/Diff    Comp Met (CMET)    Pulse oximetry, overnight    DG Chest 2 View    2. History of acute respiratory failure  Z87.09 CBC w/Diff    Comp Met (CMET)    Pulse oximetry, overnight    DG Chest 2 View    3. Hospital discharge follow-up  Z09 CBC w/Diff    Comp Met (CMET)    Pulse oximetry, overnight    DG Chest 2 View    4. 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HCC)  J84.112 CBC w/Diff    Comp Met (CMET)    Pulse oximetry, overnight    DG Chest 2 View    5. Encounter for therapeutic drug monitoring  Z51.81 CBC w/Diff    Comp Met (CMET)  Pulse oximetry, overnight    DG Chest 2 View    6. Other fatigue  R53.83 CBC w/Diff    Comp Met (CMET)    Pulse oximetry, overnight    DG Chest 2 View    7. Hyponatremia  E87.1 CBC w/Diff    Comp Met (CMET)    Pulse oximetry, overnight    DG Chest 2 View         Plan:     Patient Instructions  History of acute respiratory failure with COVID-19 mid February 2025 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HCC) Encounter  for therapeutic drug monitoring   -You have improved significantly after COVID admission. -Noticed that you are continuing pirfenidone except for some fatigue associated with it -Noticed that inhaled treprostinil versus placebo research drug is currently on hold since mid February 2025 following admission for COVID-19   -Oxygen needs have significantly improved after admission   Plan  - continue pirfenidone at full dose - monitor pulse ox even with exertion - goal >= 88%  -Check liver function test 09/16/2023 -Meet with research coordinator to restart study drug and restart study scheduled visits -Continue oxygen but he only needed with exertion past 300 feet -Retest overnight pulse oximetry on room air to see if you still need night oxygen -Check chest x-ray today post COVID   Fatigue - significant  in 2023 and on 01/04/2023 and on 09/16/2023 after covid Low sodium in the hospital February 2025   -symptoms are due to esbriet and medical issues  and covid Feb 2025 with low Sodium  Plan - cotninue esbriet but manage symptoms as outlined before - check cbc and bmet 09/16/2023     Followup -Set up research visits - 3 months - 30 min visit with Dr Marchelle Gearing   - symptom score and walk test at followup  - can cancel if needed if research data going well   FOLLOWUP Return in about 3 months (around 12/17/2023) for 30 min visit, Chronic Respiratory Failure, with Dr Marchelle Gearing, Face to Face Visit.  ( Level 05 visit E&M 2024: Estb >= 40 min n  visit type: on-site physical face to visit  in total care time and counseling or/and coordination of care by this undersigned MD - Dr Kalman Shan. This includes one or more of the following on this same day 09/16/2023: pre-charting, chart review, note writing, documentation discussion of test results, diagnostic or treatment recommendations, prognosis, risks and benefits of management options, instructions, education, compliance or risk-factor  reduction. It excludes time spent by the CMA or office staff in the care of the patient. Actual time 40 min)   SIGNATURE    Dr. Kalman Shan, M.D., F.C.C.P,  Pulmonary and Critical Care Medicine Staff Physician, [facility] Center Director - Interstitial Lung Disease  Program  Pulmonary Fibrosis [facility] at [facility] [place], [place], [address]  Pager: [phone], If no answer or between  15:00h - 7:00h: call [phone] Telephone: [phone]  9:18 AM 09/16/2023

## 2023-09-17 NOTE — Telephone Encounter (Signed)
 Add on 3/17 @ 220  Pt aware

## 2023-09-20 ENCOUNTER — Ambulatory Visit (HOSPITAL_COMMUNITY)
Admission: RE | Admit: 2023-09-20 | Discharge: 2023-09-20 | Disposition: A | Discharge status: Home / Self Care | Source: Ambulatory Visit | Attending: Cardiology | Admitting: Cardiology

## 2023-09-20 ENCOUNTER — Encounter (HOSPITAL_COMMUNITY): Payer: Self-pay | Admitting: Cardiology

## 2023-09-20 VITALS — BP 90/50 | HR 111 | Wt 168.6 lb

## 2023-09-20 DIAGNOSIS — R000 Tachycardia, unspecified: Secondary | ICD-10-CM | POA: Diagnosis not present

## 2023-09-20 DIAGNOSIS — Z8249 Family history of ischemic heart disease and other diseases of the circulatory system: Secondary | ICD-10-CM | POA: Insufficient documentation

## 2023-09-20 DIAGNOSIS — J84112 Idiopathic pulmonary fibrosis: Secondary | ICD-10-CM | POA: Diagnosis not present

## 2023-09-20 DIAGNOSIS — G4733 Obstructive sleep apnea (adult) (pediatric): Secondary | ICD-10-CM | POA: Diagnosis not present

## 2023-09-20 DIAGNOSIS — Z8572 Personal history of non-Hodgkin lymphomas: Secondary | ICD-10-CM | POA: Diagnosis not present

## 2023-09-20 DIAGNOSIS — I350 Nonrheumatic aortic (valve) stenosis: Secondary | ICD-10-CM | POA: Diagnosis not present

## 2023-09-20 DIAGNOSIS — Z79899 Other long term (current) drug therapy: Secondary | ICD-10-CM | POA: Diagnosis not present

## 2023-09-20 DIAGNOSIS — R0609 Other forms of dyspnea: Secondary | ICD-10-CM | POA: Diagnosis not present

## 2023-09-20 DIAGNOSIS — Z8546 Personal history of malignant neoplasm of prostate: Secondary | ICD-10-CM | POA: Diagnosis not present

## 2023-09-20 DIAGNOSIS — Z923 Personal history of irradiation: Secondary | ICD-10-CM | POA: Diagnosis not present

## 2023-09-20 DIAGNOSIS — I5022 Chronic systolic (congestive) heart failure: Secondary | ICD-10-CM

## 2023-09-20 DIAGNOSIS — I493 Ventricular premature depolarization: Secondary | ICD-10-CM | POA: Insufficient documentation

## 2023-09-20 DIAGNOSIS — I2510 Atherosclerotic heart disease of native coronary artery without angina pectoris: Secondary | ICD-10-CM | POA: Insufficient documentation

## 2023-09-20 DIAGNOSIS — Z8616 Personal history of COVID-19: Secondary | ICD-10-CM | POA: Insufficient documentation

## 2023-09-20 LAB — TSH: TSH: 1.258 u[IU]/mL (ref 0.350–4.500)

## 2023-09-20 NOTE — Progress Notes (Signed)
 PCP: Rodrigo Ran, MD Pulmonology: Dr. Marchelle Gearing Cardiology: Dr. Shirlee Latch  Chief complaint: tachycardia  83 y.o. with history of pulmonary fibrosis, high grade B cell lymphoma, and prostate cancer was referred by Dr. Marchelle Gearing for evaluation of dyspnea and tachycardia.  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  He is on Esbriet.  He has not used home oxygen.  He has baseline exertional dyspnea.  In 5/22, he had XRT for prostate cancer.  In 9/22, he was diagnosed with high grade B cell lymphoma and finished his 6th cycle of mini-CHOP in 4/23.  Pre-op echo in 9/22 showed EF 65-70%, normal RV, unable to estimate PASP, mild AS mean gradient 12 mmHg.  Patient has been noted by CT to have coronary calcification.   After completing CHOP in 4/23, patient developed persistent sinus tachycardia.  He felt palpitations.  D dimer was normal.  No evidence for infection.  Echo in 4/23 showed EF 55-60%, normal RV, unable to assess PA pressure (no TR jet), and nodular calcification of the mitral valve.  Due to mitral valve abnormality, blood cultures were done which were negative.  Sinus tachycardia eventually subsided on its own.   There was talk over doing CTA to further investigate etiology of dyspnea. Felt arranging LHC at time of RHC would make most sense.   [facility] showing mild LAD lesion 40% stenosed, normal filling pressures, normal PA pressure, normal CO.  S/p cervical spinal decompression & fusion 08/11/22. Seen in ED 08/23/22 with palpitations. 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 Zio monitor in 3/24 showed 16% PVCs.  Toprol XL increased to 25 mg bid.    Echo 3/25 showed EF 60-65%, normal RV, aortic sclerosis without stenosis, ascending aorta 4.0 cm. Patient had a COVID 19 episode in 3/25 requiring hospitalization.   Patient presents for evaluation of tachycardia.  He is now in an  ILD study and getting Tyvaso vs placebo.  He is using oxygen at night now.  He is short of breath walking up stairs or hills.  Since his COVID infection, he has noted a higher heart rate.  He says that it gets up to 130 bpm with light exercise and is around 100 bpm at rest.  He is in NSR.  No lightheadedness or syncope.   ECG (personally reviewed): Sinus tachycardia, otherwise normal  Labs (4/23): D dimer negative, BNP 137, HS-TnI 13, K 4.1, creatinine 0.59, hgb 10.2, WBCs 12 Labs (5/23): K 4.4, creatinine 0.64, hgb 11.3 Labs (2/24): TSH and LFTs normal, K 4.0, creatinine 0.59, hgb 11.3, HS-Tnl 7>>7. Labs (4/24): K 3.9, creatinine 0.66 Labs (5/24): LDL 68 Labs (11/24): K 4, creatinine 0.66 Labs (3/25): K 4.5, creatinine 0.5, hgb 12.7  PMH: 1. IBS 2. 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  - CT chest in 9/22 with pulmonary fibrosis, enlarged PA, coronary artery calcium.  3. CAD: Coronary calcification on 9/22 CT chest.  - Cardiolite in 9/18 showed no ischemia or infarction.  - LHC (10/23): mild nonobstructive CAD with 40% mid LAD lesion 4. Hyperlipidemia 5. High grade B cell lymphoma: Diagnosed in 9/22.  He received 6 cycles of mini-CHOP.  - Echo (9/22): Prechemo, EF 65-70%, normal RV, unable to estimate PASP, mild AS mean gradient 12 mmHg.  - Echo (4/23): EF 55-60%, normal RV, unable to assess PA pressure (no TR jet), and nodular calcification of the mitral valve.  - RHC (10/23): RA  2, PA 24/5 (mean 14), PCWP 5, CO/CI 6.26/3.29 - Echo (3/24): EF 55-60%, normal RV, mild-moderate AS with mean gradient 11 and AVA 1.52 cm^2.  - Echo (3/25): EF 60-65%, normal RV, aortic sclerosis without stenosis, ascending aorta 4.0 cm.  6. Aortic stenosis: Mild-moderate on 3/24 echo but minimal on 3/25 echo.  7. 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  8. H/o L-spine compression fracture.  9. Sleep study 5/24 with mild OSA.  10. PVCs: Zio monitor (3/24) with 16% PVCs  SH: Married, lives in [place],  retired [employer] scientist, nonsmoker, rare ETOH.   Family History  Problem Relation Age of Onset   Heart failure Father 64   Bladder Cancer Mother    Breast cancer Mother    Colon cancer Neg Hx    Pancreatic cancer Neg Hx    Rectal cancer Neg Hx    Stomach cancer Neg Hx    Prostate cancer Neg Hx    ROS: All systems reviewed and negative except as per HPI.   Current Outpatient Medications  Medication Sig Dispense Refill   acetaminophen (TYLENOL) 500 MG tablet Take 1,000 mg by mouth every 6 (six) hours as needed for moderate pain.     acetaminophen-codeine (TYLENOL #3) 300-30 MG tablet Take 1 tablet by mouth every 4 (four) hours as needed (for pain).     albuterol (VENTOLIN HFA) 108 (90 Base) MCG/ACT inhaler Inhale 2 puffs into the lungs every 6 (six) hours as needed for wheezing or shortness of breath. 6.7 g 2   aspirin 81 MG chewable tablet Chew 81 mg by mouth 3 (three) times a week.     carboxymethylcellulose (REFRESH TEARS) 0.5 % SOLN Place 1 drop into both eyes daily as needed (dry eyes).     carisoprodol (SOMA) 250 MG tablet Take 175-350 mg by mouth 3 (three) times daily as needed (for muscle spasms).     Cholecalciferol (VITAMIN D-3) 1000 UNITS CAPS Take 2,000 Units by mouth every evening.     CO-ENZYME Q-10 PO Take 1 tablet by mouth every evening.      Cyanocobalamin (VITAMIN B12) 1000 MCG TBCR Take 1,000 mcg by mouth daily.     denosumab (PROLIA) 60 MG/ML SOSY injection Inject 60 mg into the skin every 6 (six) months. At [facility]     desvenlafaxine (PRISTIQ) 50 MG 24 hr tablet Take 50 mg by mouth daily.     diclofenac (VOLTAREN) 75 MG EC tablet Take 75 mg by mouth 2 (two) times daily as needed (pain.).  2   diclofenac sodium (VOLTAREN) 1 % GEL Apply 4 g topically 4 (four) times daily as needed (pain.).  12   dicyclomine (BENTYL) 10 MG capsule Take one by mouth every 6 hours as needed for abdominal cramping 30 capsule 1   diphenoxylate-atropine (LOMOTIL) 2.5-0.025 MG tablet  Take 1 tablet by mouth as needed for diarrhea or loose stools.     Emollient (AQUAPHOR ADV PROTECT HEALING EX) Apply 1 Application topically daily as needed (itching).     Evolocumab (REPATHA) 140 MG/ML SOSY Inject 140 mg into the skin every 30 (thirty) days.     Glucosamine Sulfate 500 MG TABS Take 500 mg by mouth daily.     HYDROcodone-acetaminophen (NORCO) 10-325 MG tablet Take 1 tablet by mouth See admin instructions. Take 1 tablet by mouth every 4-6 hours as needed for pain     Investigational - Study Medication Inhale 12 puffs into the lungs See admin instructions. Dr. Kalman Shan: SaTitle:A Randomized, Double-blind,  Placebo-controlled, Phase 3 Study of the Efficacy and Safety of Inhaled Treprostinil in Subjects with Idiopathic Pulmonary Fibrosis Dose and Duration of Treatment:Treprostinil for Inhalation 0.6 mg/mL, or placebo (randomly assigned 1:1) over a 52 week period.  Protocol # RIN-PF-301; IND # [id number]; Clinical Trials.gov Identifier: [id number]  Inhale 12 puffs into the lungs 4 times a day     latanoprost (XALATAN) 0.005 % ophthalmic solution Place 1 drop into both eyes at bedtime.     loratadine (CLARITIN) 10 MG tablet Take 10 mg by mouth daily.     meclizine (ANTIVERT) 25 MG tablet Take 25 mg by mouth 3 (three) times daily as needed for dizziness.     metoprolol succinate (TOPROL XL) 25 MG 24 hr tablet Take 1 tablet (25 mg total) by mouth 2 (two) times daily. 60 tablet 11   Multiple Vitamin (MULTIVITAMIN) tablet Take 1 tablet by mouth daily with breakfast.     omeprazole (PRILOSEC) 40 MG capsule TAKE 1 CAPSULE (40 MG TOTAL) BY MOUTH DAILY. 90 capsule 0   ondansetron (ZOFRAN) 8 MG tablet TAKE 1 TABLET BY MOUTH EVERY 8 HOURS AS NEEDED FOR NAUSEA AND VOMITING 30 tablet 1   Pirfenidone (ESBRIET) 801 MG TABS Take 1 tablet (801 mg total) by mouth 3 (three) times daily with meals. 270 tablet 1   Probiotic Product (ALIGN) 4 MG CAPS Take 4 mg by mouth daily.     SODIUM FLUORIDE 5000 PLUS  1.1 % CREA dental cream Place 1 Application onto teeth at bedtime.     XHANCE 93 MCG/ACT EXHU Place 1 spray into both nostrils 2 (two) times daily as needed (for sinus issues).     No current facility-administered medications for this encounter.   Wt Readings from Last 3 Encounters:  09/20/23 76.5 kg (168 lb 9.6 oz)  09/16/23 76 kg (167 lb 9.6 oz)  08/22/23 77.8 kg (171 lb 8.3 oz)   BP (!) 90/50   Pulse (!) 111   Wt 76.5 kg (168 lb 9.6 oz)   SpO2 95%   BMI 24.19 kg/m  General: NAD Neck: No JVD, no thyromegaly or thyroid nodule.  Lungs: Dry crackles CV: Nondisplaced PMI.  Heart mildly tachy, regular S1/S2, no S3/S4, 1/6 SEM RUSB.  No peripheral edema.  No carotid bruit.  Normal pedal pulses.  Abdomen: Soft, nontender, no hepatosplenomegaly, no distention.  Skin: Intact without lesions or rashes.  Neurologic: Alert and oriented x 3.  Psych: Normal affect. Extremities: No clubbing or cyanosis.  HEENT: Normal.   Assessment/Plan: 1. PVCs: Zio monitor in 3/24 with 16% PVCs.  Toprol XL was increased.  Echo in 3/24 showed normal EF.  Cath in 10/23 showed nonobstructive CAD.  No PVCs on today's ECG.   - Continue Toprol XL 25 mg bid.  - Mild OSA on sleep study, he does not want to use CPAP.  2. Exertional dyspnea: Suspect this is primarily due to IPF.  Echo in 3/24 showed EF 55-60%, normal RV, mild-moderate AS with mean gradient 11 and AVA 1.52 cm^2.  RHC (10/23) with normal filling pressures, normal PA pressure and normal CO. No indication for Tyvaso based on RHC, but he is in a clinical trial of Tyvaso vs placebo in IPF without PH. Echo in 3/25 showed EF 60-65%, normal RV, aortic sclerosis without stenosis, ascending aorta 4.0 cm. He is not volume overloaded on exam.  - Suspect progressive IPF is the main contributor to dyspnea.  4. IPF: Long-standing, has been on Esbriet.  5. CAD: Coronary  calcification noted. LHC (10/23) with mild, non-obstructive CAD; mild LAD 40% stenosed. No chest  pain. - Continue ASA 81.  - Continue Repatha.  6. Aortic stenosis: Mild to moderate on 3/24 echo.  However, minimal on 3/25 echo.  7. Tachycardia: Patient has noted elevated HR recently.  Mild sinus tachycardia on ECG today, HR gets up to 130s with light exercise.  I suspect this is due to deconditioning in setting of recent COVID (hospitalized x 5 days).  No evidence for atrial fibrillation and recent echo showed normal EF.  - Check TSH.   Follow up 6 months.   I spent 21 minutes reviewing records, interviewing/examining patient, and managing orders.    Marca Ancona  09/20/2023

## 2023-09-20 NOTE — Patient Instructions (Addendum)
 No change in medications. Labs today - will call you if abnormal. Please keep your scheduled appointment - see below. Please call us at [phone] if any questions or concerns prior to your next appointment.

## 2023-09-20 NOTE — Telephone Encounter (Signed)
 PI OVERSIGHT ATTESTATION  I the Principal Investigator (PI) for the above mentioned study attest that I reviewed the mentioned  clinical research coordinator  notes on research subject  Kenneth [name]  born [birth year] . I  agree with the findings mentioned above   Dr.Soleil Mas Marchelle Gearing, MD Pulmonary and Critical Care Medicine Research Investigator & Staff Physician [facility] and [facility]  [place] [facility] Pager: [phone], If no answer or between  15:00h - 7:00h: call [phone]  09/20/2023 5:39 PM

## 2023-09-21 DIAGNOSIS — H401121 Primary open-angle glaucoma, left eye, mild stage: Secondary | ICD-10-CM | POA: Diagnosis not present

## 2023-09-21 DIAGNOSIS — Z961 Presence of intraocular lens: Secondary | ICD-10-CM | POA: Diagnosis not present

## 2023-09-21 DIAGNOSIS — H401112 Primary open-angle glaucoma, right eye, moderate stage: Secondary | ICD-10-CM | POA: Diagnosis not present

## 2023-09-21 DIAGNOSIS — H52203 Unspecified astigmatism, bilateral: Secondary | ICD-10-CM | POA: Diagnosis not present

## 2023-09-23 ENCOUNTER — Encounter

## 2023-09-24 DIAGNOSIS — U071 COVID-19: Secondary | ICD-10-CM | POA: Diagnosis not present

## 2023-09-24 DIAGNOSIS — J9601 Acute respiratory failure with hypoxia: Secondary | ICD-10-CM | POA: Diagnosis not present

## 2023-09-24 NOTE — Telephone Encounter (Signed)
 xxx

## 2023-09-28 DIAGNOSIS — Z8572 Personal history of non-Hodgkin lymphomas: Secondary | ICD-10-CM | POA: Diagnosis not present

## 2023-09-28 DIAGNOSIS — R0602 Shortness of breath: Secondary | ICD-10-CM | POA: Diagnosis not present

## 2023-09-28 DIAGNOSIS — Z923 Personal history of irradiation: Secondary | ICD-10-CM | POA: Diagnosis not present

## 2023-09-28 DIAGNOSIS — Z8546 Personal history of malignant neoplasm of prostate: Secondary | ICD-10-CM | POA: Diagnosis not present

## 2023-09-28 DIAGNOSIS — J84112 Idiopathic pulmonary fibrosis: Secondary | ICD-10-CM | POA: Diagnosis not present

## 2023-09-29 ENCOUNTER — Encounter

## 2023-09-29 ENCOUNTER — Telehealth (HOSPITAL_COMMUNITY): Payer: Self-pay | Admitting: *Deleted

## 2023-09-29 DIAGNOSIS — J84112 Idiopathic pulmonary fibrosis: Secondary | ICD-10-CM

## 2023-09-29 DIAGNOSIS — I517 Cardiomegaly: Secondary | ICD-10-CM | POA: Diagnosis not present

## 2023-09-29 DIAGNOSIS — I288 Other diseases of pulmonary vessels: Secondary | ICD-10-CM | POA: Diagnosis not present

## 2023-09-29 DIAGNOSIS — Z006 Encounter for examination for normal comparison and control in clinical research program: Secondary | ICD-10-CM

## 2023-09-29 NOTE — Telephone Encounter (Signed)
 Received message from pt regarding scheduling pulmonary rehab on departmental voicemail.  Called and spoke to pt.  Pt of Dr. Marchelle Gearing and Dr Evelina Dun at [facility] who per pt have recommended pulmonary rehab for his pulmonary fibrosis.  Pt participated previously in 2018. As of yet, we have not received a referral although pt was seen on yesterday 3/25 and I can see that a referral was initiated but not yet received on our end. Assured pt once received we will then verify benefits and eligibility an he will be contacted for scheduling.  General program information provided. Thanked me for my call. Alanson Aly, BSN Cardiac and Emergency planning/management officer

## 2023-09-29 NOTE — Research (Signed)
 Title:A Randomized, Double-blind, Placebo-controlled, Phase 3 Study of the Efficacy and Safety of Inhaled Treprostinil in Subjects with Idiopathic Pulmonary Fibrosis   Dose and Duration of Treatment:Treprostinil for Inhalation 0.6 mg/mL, or placebo (randomly assigned 1:1) over a 52 week period.  Protocol # RIN-PF-301; IND # [id number]; Clinical Trials.gov Identifier: [id number]  Sponsor: [employer]Research [place], [place] [address] [facility] @ [facility] Clinical Research Coordinator note:   This visit for [name] with DOB: 11-29-1940 on 09/29/2023 for the above protocol is Visit/Encounter # Week 16 out of window  and is for purpose of research.   The consent for this encounter is under Protocol Version Amendment 4 dated: 26Jul2023 IB: Version 19 dated 08Jul2024 ICF: Ver. 3.0, 05 Feb 2022, WCG Ver. 3.1, 02 Apr 2022, IRB APPROVED AS MODIFIED Apr 07, 2022 Lab Manual: V4 dated 07Jun2023 currently IRB approved.  During the visit, the patient informed the CRC that they wish to withdraw from the study. They stated they are no longer taking the study drug and cannot continue participating. The CRC explained that Early Termination (ET) visit would be required, which includes several assessments. The patient initially declined to complete any assessments. The CRC then consulted with the Principal Investigator (PI) Dr Marchelle Gearing. Following the PI's discussion with the patient, an ET visit has been scheduled for 06 October 2023. Patient returned the study drug,patient diary and the nebulizer today 29 Sep 2023. The patient has agreed to return on 06 October 2023 to complete all required assessments for the ET visit. The patient was also advised to return all study-related materials on this date. Patient declined assessments for today and said they are not doing the study drug 4 times and want to withdraw from the study. Subject confirmed that there was  No change in contact information  (e.g. address, telephone, email). Subject thanked for participation in research and contribution to science.  The Subject was  informed that the PI Dr Marchelle Gearing continues to have oversight of the subject's visits and course through relevant discussions, reviews, and also specifically of this visit by routing of this note to the PI.  Signed by  Neita Garnet  Clinical Research Coordinator [facility]  [place], [place] 11:12 AM 09/29/2023

## 2023-09-30 ENCOUNTER — Telehealth (HOSPITAL_COMMUNITY): Payer: Self-pay

## 2023-09-30 ENCOUNTER — Encounter

## 2023-09-30 NOTE — Telephone Encounter (Signed)
 Pt insurance is active and benefits verified through [employer]. Co-pay $15, DED $0/$0 met, out of pocket $3,400/$1,586.66 met, co-insurance 0%. No pre-authorization required. 09/30/23 @ 11:58am, REF# [id number].

## 2023-09-30 NOTE — Telephone Encounter (Signed)
 Called patient to see if he was interested in participating in the Pulmonary Rehab Program. Patient will come in for orientation on 10/01/23 and will attend the 10:15 exercise class.  Sent MyChart message.

## 2023-10-01 ENCOUNTER — Encounter (HOSPITAL_COMMUNITY)
Admission: RE | Admit: 2023-10-01 | Discharge: 2023-10-01 | Disposition: A | Discharge status: Home / Self Care | Source: Ambulatory Visit | Attending: Pulmonary Disease | Admitting: Pulmonary Disease

## 2023-10-01 ENCOUNTER — Encounter (HOSPITAL_COMMUNITY): Payer: Self-pay

## 2023-10-01 ENCOUNTER — Other Ambulatory Visit: Payer: Self-pay

## 2023-10-01 VITALS — BP 122/64 | HR 93 | Ht 69.0 in | Wt 167.5 lb

## 2023-10-01 DIAGNOSIS — J84112 Idiopathic pulmonary fibrosis: Secondary | ICD-10-CM | POA: Insufficient documentation

## 2023-10-01 NOTE — Progress Notes (Signed)
 Pulmonary Individual Treatment Plan  Patient Details  Name: Kenneth [name] MRN: [id number] Date of Birth: 1940/11/16 Referring Provider:   Doristine Devoid Pulmonary Rehab Walk Test from 10/01/2023 in [facility]  Referring Provider [name]  [[name]]       Initial Encounter Date:  Flowsheet Row Pulmonary Rehab Walk Test from 10/01/2023 in [facility]  Date 10/01/23       Visit Diagnosis: 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HCC)  Patient's Home Medications on Admission:   Current Outpatient Medications:    acetaminophen (TYLENOL) 500 MG tablet, Take 1,000 mg by mouth every 6 (six) hours as needed for moderate pain., Disp: , Rfl:    acetaminophen-codeine (TYLENOL #3) 300-30 MG tablet, Take 1 tablet by mouth every 4 (four) hours as needed (for pain)., Disp: , Rfl:    albuterol (VENTOLIN HFA) 108 (90 Base) MCG/ACT inhaler, Inhale 2 puffs into the lungs every 6 (six) hours as needed for wheezing or shortness of breath., Disp: 6.7 g, Rfl: 2   aspirin 81 MG chewable tablet, Chew 81 mg by mouth 3 (three) times a week., Disp: , Rfl:    carboxymethylcellulose (REFRESH TEARS) 0.5 % SOLN, Place 1 drop into both eyes daily as needed (dry eyes)., Disp: , Rfl:    carisoprodol (SOMA) 250 MG tablet, Take 175-350 mg by mouth 3 (three) times daily as needed (for muscle spasms)., Disp: , Rfl:    Cholecalciferol (VITAMIN D-3) 1000 UNITS CAPS, Take 2,000 Units by mouth every evening., Disp: , Rfl:    CO-ENZYME Q-10 PO, Take 1 tablet by mouth every evening. , Disp: , Rfl:    Cyanocobalamin (VITAMIN B12) 1000 MCG TBCR, Take 1,000 mcg by mouth daily., Disp: , Rfl:    denosumab (PROLIA) 60 MG/ML SOSY injection, Inject 60 mg into the skin every 6 (six) months. At [facility], Disp: , Rfl:    desvenlafaxine (PRISTIQ) 50 MG 24 hr tablet, Take 50 mg by mouth daily., Disp: , Rfl:    diclofenac  (VOLTAREN) 75 MG EC tablet, Take 75 mg by mouth 2 (two) times daily as needed (pain.)., Disp: , Rfl: 2   diclofenac sodium (VOLTAREN) 1 % GEL, Apply 4 g topically 4 (four) times daily as needed (pain.)., Disp: , Rfl: 12   dicyclomine (BENTYL) 10 MG capsule, Take one by mouth every 6 hours as needed for abdominal cramping, Disp: 30 capsule, Rfl: 1   diphenoxylate-atropine (LOMOTIL) 2.5-0.025 MG tablet, Take 1 tablet by mouth as needed for diarrhea or loose stools., Disp: , Rfl:    Emollient (AQUAPHOR ADV PROTECT HEALING EX), Apply 1 Application topically daily as needed (itching)., Disp: , Rfl:    Evolocumab (REPATHA) 140 MG/ML SOSY, Inject 140 mg into the skin every 30 (thirty) days., Disp: , Rfl:    Glucosamine Sulfate 500 MG TABS, Take 500 mg by mouth daily., Disp: , Rfl:    HYDROcodone-acetaminophen (NORCO) 10-325 MG tablet, Take 1 tablet by mouth See admin instructions. Take 1 tablet by mouth every 4-6 hours as needed for pain, Disp: , Rfl:    Investigational - Study Medication, Inhale 12 puffs into the lungs See admin instructions. Dr. Kalman Shan: SaTitle:A Randomized, Double-blind, Placebo-controlled, Phase 3 Study of the Efficacy and Safety of Inhaled Treprostinil in Subjects with Idiopathic Pulmonary Fibrosis Dose and Duration of Treatment:Treprostinil for Inhalation 0.6 mg/mL, or placebo (randomly assigned 1:1) over a 52 week period.  Protocol # [id number]; IND # [id number]; Clinical Trials.gov Identifier: [id number]  Inhale 12 puffs into the lungs 4 times a day, Disp: , Rfl:    latanoprost (XALATAN) 0.005 % ophthalmic solution, Place 1 drop into both eyes at bedtime., Disp: , Rfl:    loratadine (CLARITIN) 10 MG tablet, Take 10 mg by mouth daily., Disp: , Rfl:    meclizine (ANTIVERT) 25 MG tablet, Take 25 mg by mouth 3 (three) times daily as needed for dizziness., Disp: , Rfl:    metoprolol succinate (TOPROL XL) 25 MG 24 hr tablet, Take 1 tablet (25 mg total) by mouth 2 (two) times daily., Disp:  60 tablet, Rfl: 11   Multiple Vitamin (MULTIVITAMIN) tablet, Take 1 tablet by mouth daily with breakfast., Disp: , Rfl:    omeprazole (PRILOSEC) 40 MG capsule, TAKE 1 CAPSULE (40 MG TOTAL) BY MOUTH DAILY., Disp: 90 capsule, Rfl: 0   ondansetron (ZOFRAN) 8 MG tablet, TAKE 1 TABLET BY MOUTH EVERY 8 HOURS AS NEEDED FOR NAUSEA AND VOMITING, Disp: 30 tablet, Rfl: 1   Pirfenidone (ESBRIET) 801 MG TABS, Take 1 tablet (801 mg total) by mouth 3 (three) times daily with meals., Disp: 270 tablet, Rfl: 1   Probiotic Product (ALIGN) 4 MG CAPS, Take 4 mg by mouth daily., Disp: , Rfl:    SODIUM FLUORIDE 5000 PLUS 1.1 % CREA dental cream, Place 1 Application onto teeth at bedtime., Disp: , Rfl:    XHANCE 93 MCG/ACT EXHU, Place 1 spray into both nostrils 2 (two) times daily as needed (for sinus issues)., Disp: , Rfl:   Past Medical History: Past Medical History:  Diagnosis Date   Arthritis    Depression    Diffuse large B cell lymphoma (HCC) 2023   Diverticulosis of colon (without mention of hemorrhage)    GERD (gastroesophageal reflux disease)    Hiatal hernia    HLD (hyperlipidemia)    Internal hemorrhoids without mention of complication    Irritable bowel syndrome    Other specified gastritis without mention of hemorrhage    Prostate cancer (HCC) 2022   Pulmonary fibrosis (HCC)    Stress fracture 02/2021    Tobacco Use: Social History   Tobacco Use  Smoking Status Never  Smokeless Tobacco Never    Labs: Review Flowsheet       Latest Ref Rng & Units 04/14/2022 12/01/2022 08/22/2023  Labs for ITP Cardiac and Pulmonary Rehab  Cholestrol 0 - 200 mg/dL - 161  -  LDL (calc) 0 - 99 mg/dL - 68  -  HDL-C >09 mg/dL - 50  -  Trlycerides <604 mg/dL - 540  -  Bicarbonate 98.1 - 28.0 mmol/L 26.3  25.1  - 23.6   TCO2 22 - 32 mmol/L 28  26  - 25   O2 Saturation % 76  75  - 97     Details       Multiple values from one day are sorted in reverse-chronological order         Capillary Blood  Glucose: Lab Results  Component Value Date   GLUCAP 119 (H) 09/29/2021   GLUCAP 118 (H) 05/26/2021     Pulmonary Assessment Scores:  Pulmonary Assessment Scores     Row Name 10/01/23 0940         ADL UCSD   ADL Phase Entry     SOB Score total 47       CAT Score   CAT Score 12       mMRC  Score   mMRC Score 1             UCSD: Self-administered rating of dyspnea associated with activities of daily living (ADLs) 6-point scale (0 = "not at all" to 5 = "maximal or unable to do because of breathlessness")  Scoring Scores range from 0 to 120.  Minimally important difference is 5 units  CAT: CAT can identify the health impairment of COPD patients and is better correlated with disease progression.  CAT has a scoring range of zero to 40. The CAT score is classified into four groups of low (less than 10), medium (10 - 20), high (21-30) and very high (31-40) based on the impact level of disease on health status. A CAT score over 10 suggests significant symptoms.  A worsening CAT score could be explained by an exacerbation, poor medication adherence, poor inhaler technique, or progression of COPD or comorbid conditions.  CAT MCID is 2 points  mMRC: mMRC (Modified Medical Research Council) Dyspnea Scale is used to assess the degree of baseline functional disability in patients of respiratory disease due to dyspnea. No minimal important difference is established. A decrease in score of 1 point or greater is considered a positive change.   Pulmonary Function Assessment:   Exercise Target Goals: Exercise Program Goal: Individual exercise prescription set using results from initial 6 min walk test and THRR while considering  patient's activity barriers and safety.   Exercise Prescription Goal: Initial exercise prescription builds to 30-45 minutes a day of aerobic activity, 2-3 days per week.  Home exercise guidelines will be given to patient during program as part of exercise  prescription that the participant will acknowledge.  Activity Barriers & Risk Stratification:  Activity Barriers & Cardiac Risk Stratification - 10/01/23 0928       Activity Barriers & Cardiac Risk Stratification   Activity Barriers Back Problems;Shortness of Breath;Deconditioning;Muscular Weakness             6 Minute Walk:  6 Minute Walk     Row Name 10/01/23 1030         6 Minute Walk   Phase Initial     Distance 810 feet     Walk Time 6 minutes     # of Rest Breaks 2  1:41-2:00, 3:36-4:03     MPH 1.53     METS 1.87     RPE 13     Perceived Dyspnea  2     VO2 Peak 6.56     Symptoms No     Resting HR 110 bpm     Resting BP 122/64     Resting Oxygen Saturation  91 %     Exercise Oxygen Saturation  during 6 min walk 86 %     Max Ex. HR 122 bpm     Max Ex. BP 132/70     2 Minute Post BP 128/70       Interval HR   1 Minute HR 110     2 Minute HR 96     3 Minute HR 120     4 Minute HR 122     5 Minute HR 97     6 Minute HR 121     2 Minute Post HR 92     Interval Heart Rate? Yes       Interval Oxygen   Interval Oxygen? Yes     Baseline Oxygen Saturation % 91 %     1 Minute Oxygen Saturation %  87 %     1 Minute Liters of Oxygen 0 L  increased to 1L     2 Minute Oxygen Saturation % 86 %     2 Minute Liters of Oxygen 1 L  increased to 2L     3 Minute Oxygen Saturation % 88 %     3 Minute Liters of Oxygen 2 L  increased to 3L     4 Minute Oxygen Saturation % 89 %     4 Minute Liters of Oxygen 3 L     5 Minute Oxygen Saturation % 91 %     5 Minute Liters of Oxygen 3 L     6 Minute Oxygen Saturation % 89 %     6 Minute Liters of Oxygen 3 L     2 Minute Post Oxygen Saturation % 94 %     2 Minute Post Liters of Oxygen 3 L              Oxygen Initial Assessment:  Oxygen Initial Assessment - 10/01/23 0925       Home Oxygen   Home Oxygen Device Portable Concentrator;Home Concentrator    Sleep Oxygen Prescription Continuous    Liters per minute 2     Home Exercise Oxygen Prescription Pulsed    Liters per minute 2    Home Resting Oxygen Prescription None    Compliance with Home Oxygen Use Yes      Initial 6 min Walk   Oxygen Used Continuous    Liters per minute 2      Program Oxygen Prescription   Program Oxygen Prescription Continuous    Liters per minute 2      Intervention   Short Term Goals To learn and exhibit compliance with exercise, home and travel O2 prescription;To learn and understand importance of maintaining oxygen saturations>88%;To learn and demonstrate proper use of respiratory medications;To learn and understand importance of monitoring SPO2 with pulse oximeter and demonstrate accurate use of the pulse oximeter.;To learn and demonstrate proper pursed lip breathing techniques or other breathing techniques.     Long  Term Goals Exhibits compliance with exercise, home  and travel O2 prescription;Verbalizes importance of monitoring SPO2 with pulse oximeter and return demonstration;Exhibits proper breathing techniques, such as pursed lip breathing or other method taught during program session;Maintenance of O2 saturations>88%;Compliance with respiratory medication;Demonstrates proper use of MDI's             Oxygen Re-Evaluation:   Oxygen Discharge (Final Oxygen Re-Evaluation):   Initial Exercise Prescription:  Initial Exercise Prescription - 10/01/23 1000       Date of Initial Exercise RX and Referring Provider   Date 10/01/23    Referring Provider Lyndon Code   Expected Discharge Date 12/21/23      Oxygen   Oxygen Continuous    Liters 3    Maintain Oxygen Saturation 88% or higher      Treadmill   MPH 1.8    Grade 0    Minutes 15    METs 2      Bike   Level 1    Watts 20    Minutes 40    METs 1.5      Prescription Details   Frequency (times per week) 2    Duration Progress to 30 minutes of continuous aerobic without signs/symptoms of physical distress      Intensity   THRR 40-80% of Max  Heartrate 56-135    Ratings of Perceived  Exertion 11-13    Perceived Dyspnea 0-4      Progression   Progression Continue to progress workloads to maintain intensity without signs/symptoms of physical distress.      Resistance Training   Training Prescription Yes    Weight blue bands    Reps 10-15             Perform Capillary Blood Glucose checks as needed.  Exercise Prescription Changes:   Exercise Comments:   Exercise Goals and Review:   Exercise Goals     Row Name 10/01/23 1610             Exercise Goals   Increase Physical Activity Yes       Intervention Provide advice, education, support and counseling about physical activity/exercise needs.;Develop an individualized exercise prescription for aerobic and resistive training based on initial evaluation findings, risk stratification, comorbidities and participant's personal goals.       Expected Outcomes Short Term: Attend rehab on a regular basis to increase amount of physical activity.;Long Term: Add in home exercise to make exercise part of routine and to increase amount of physical activity.;Long Term: Exercising regularly at least 3-5 days a week.       Increase Strength and Stamina Yes       Intervention Provide advice, education, support and counseling about physical activity/exercise needs.;Develop an individualized exercise prescription for aerobic and resistive training based on initial evaluation findings, risk stratification, comorbidities and participant's personal goals.       Expected Outcomes Short Term: Increase workloads from initial exercise prescription for resistance, speed, and METs.;Short Term: Perform resistance training exercises routinely during rehab and add in resistance training at home;Long Term: Improve cardiorespiratory fitness, muscular endurance and strength as measured by increased METs and functional capacity ( )       Able to understand and use rate of perceived exertion (RPE) scale  Yes       Intervention Provide education and explanation on how to use RPE scale       Expected Outcomes Short Term: Able to use RPE daily in rehab to express subjective intensity level;Long Term:  Able to use RPE to guide intensity level when exercising independently       Able to understand and use Dyspnea scale Yes       Intervention Provide education and explanation on how to use Dyspnea scale       Expected Outcomes Short Term: Able to use Dyspnea scale daily in rehab to express subjective sense of shortness of breath during exertion;Long Term: Able to use Dyspnea scale to guide intensity level when exercising independently       Knowledge and understanding of Target Heart Rate Range (THRR) Yes       Intervention Provide education and explanation of THRR including how the numbers were predicted and where they are located for reference       Expected Outcomes Short Term: Able to state/look up THRR;Short Term: Able to use daily as guideline for intensity in rehab;Long Term: Able to use THRR to govern intensity when exercising independently       Understanding of Exercise Prescription Yes       Intervention Provide education, explanation, and written materials on patient's individual exercise prescription       Expected Outcomes Short Term: Able to explain program exercise prescription;Long Term: Able to explain home exercise prescription to exercise independently                Exercise Goals  Re-Evaluation :   Discharge Exercise Prescription (Final Exercise Prescription Changes):   Nutrition:  Target Goals: Understanding of nutrition guidelines, daily intake of sodium 1500mg , cholesterol 200mg , calories 30% from fat and 7% or less from saturated fats, daily to have 5 or more servings of fruits and vegetables.  Biometrics:    Nutrition Therapy Plan and Nutrition Goals:   Nutrition Assessments:  MEDIFICTS Score Key: >=70 Need to make dietary changes  40-70 Heart Healthy  Diet <= 40 Therapeutic Level Cholesterol Diet   Picture Your Plate Scores: <16 Unhealthy dietary pattern with much room for improvement. 41-50 Dietary pattern unlikely to meet recommendations for good health and room for improvement. 51-60 More healthful dietary pattern, with some room for improvement.  >60 Healthy dietary pattern, although there may be some specific behaviors that could be improved.    Nutrition Goals Re-Evaluation:   Nutrition Goals Discharge (Final Nutrition Goals Re-Evaluation):   Psychosocial: Target Goals: Acknowledge presence or absence of significant depression and/or stress, maximize coping skills, provide positive support system. Participant is able to verbalize types and ability to use techniques and skills needed for reducing stress and depression.  Initial Review & Psychosocial Screening:  Initial Psych Review & Screening - 10/01/23 0931       Initial Review   Current issues with None Identified      Family Dynamics   Good Support System? Yes    Comments spouse and children      Barriers   Psychosocial barriers to participate in program There are no identifiable barriers or psychosocial needs.      Screening Interventions   Interventions Encouraged to exercise             Quality of Life Scores:  Scores of 19 and below usually indicate a poorer quality of life in these areas.  A difference of  2-3 points is a clinically meaningful difference.  A difference of 2-3 points in the total score of the Quality of Life Index has been associated with significant improvement in overall quality of life, self-image, physical symptoms, and general health in studies assessing change in quality of life.  PHQ-9: Review Flowsheet       05/21/2017  Depression screen PHQ 2/9  Decreased Interest 0  Down, Depressed, Hopeless 0  PHQ - 2 Score 0   Interpretation of Total Score  Total Score Depression Severity:  1-4 = Minimal depression, 5-9 = Mild  depression, 10-14 = Moderate depression, 15-19 = Moderately severe depression, 20-27 = Severe depression   Psychosocial Evaluation and Intervention:  Psychosocial Evaluation - 10/01/23 0931       Psychosocial Evaluation & Interventions   Interventions Encouraged to exercise with the program and follow exercise prescription    Comments Joevon denies any psychosocial barriers at this time.    Expected Outcomes patient will remain free from psychosocial barriers to participation in pulmonary rehab    Continue Psychosocial Services  No Follow up required             Psychosocial Re-Evaluation:   Psychosocial Discharge (Final Psychosocial Re-Evaluation):   Education: Education Goals: Education classes will be provided on a weekly basis, covering required topics. Participant will state understanding/return demonstration of topics presented.  Learning Barriers/Preferences:  Learning Barriers/Preferences - 10/01/23 0932       Learning Barriers/Preferences   Learning Barriers None    Learning Preferences Written Material;Verbal Instruction;Group Instruction;Individual Instruction             Education Topics: Know  Your Numbers Group instruction that is supported by a PowerPoint presentation. Instructor discusses importance of knowing and understanding resting, exercise, and post-exercise oxygen saturation, heart rate, and blood pressure. Oxygen saturation, heart rate, blood pressure, rating of perceived exertion, and dyspnea are reviewed along with a normal range for these values.    Exercise for the Pulmonary Patient Group instruction that is supported by a PowerPoint presentation. Instructor discusses benefits of exercise, core components of exercise, frequency, duration, and intensity of an exercise routine, importance of utilizing pulse oximetry during exercise, safety while exercising, and options of places to exercise outside of rehab.    MET Level  Group instruction  provided by PowerPoint, verbal discussion, and written material to support subject matter. Instructor reviews what METs are and how to increase METs.    Pulmonary Medications Verbally interactive group education provided by instructor with focus on inhaled medications and proper administration.   Anatomy and Physiology of the Respiratory System Group instruction provided by PowerPoint, verbal discussion, and written material to support subject matter. Instructor reviews respiratory cycle and anatomical components of the respiratory system and their functions. Instructor also reviews differences in obstructive and restrictive respiratory diseases with examples of each.    Oxygen Safety Group instruction provided by PowerPoint, verbal discussion, and written material to support subject matter. There is an overview of "What is Oxygen" and "Why do we need it".  Instructor also reviews how to create a safe environment for oxygen use, the importance of using oxygen as prescribed, and the risks of noncompliance. There is a brief discussion on traveling with oxygen and resources the patient may utilize.   Oxygen Use Group instruction provided by PowerPoint, verbal discussion, and written material to discuss how supplemental oxygen is prescribed and different types of oxygen supply systems. Resources for more information are provided.    Breathing Techniques Group instruction that is supported by demonstration and informational handouts. Instructor discusses the benefits of pursed lip and diaphragmatic breathing and detailed demonstration on how to perform both.     Risk Factor Reduction Group instruction that is supported by a PowerPoint presentation. Instructor discusses the definition of a risk factor, different risk factors for pulmonary disease, and how the heart and lungs work together.   Pulmonary Diseases Group instruction provided by PowerPoint, verbal discussion, and written material to  support subject matter. Instructor gives an overview of the different type of pulmonary diseases. There is also a discussion on risk factors and symptoms as well as ways to manage the diseases.   Stress and Energy Conservation Group instruction provided by PowerPoint, verbal discussion, and written material to support subject matter. Instructor gives an overview of stress and the impact it can have on the body. Instructor also reviews ways to reduce stress. There is also a discussion on energy conservation and ways to conserve energy throughout the day.   Warning Signs and Symptoms Group instruction provided by PowerPoint, verbal discussion, and written material to support subject matter. Instructor reviews warning signs and symptoms of stroke, heart attack, cold and flu. Instructor also reviews ways to prevent the spread of infection.   Other Education Group or individual verbal, written, or video instructions that support the educational goals of the pulmonary rehab program.    Knowledge Questionnaire Score:  Knowledge Questionnaire Score - 10/01/23 1022       Knowledge Questionnaire Score   Pre Score 18/18             Core Components/Risk Factors/Patient Goals at Admission:  Personal Goals and Risk Factors at Admission - 10/01/23 0932       Core Components/Risk Factors/Patient Goals on Admission   Improve shortness of breath with ADL's Yes    Intervention Provide education, individualized exercise plan and daily activity instruction to help decrease symptoms of SOB with activities of daily living.    Expected Outcomes Short Term: Improve cardiorespiratory fitness to achieve a reduction of symptoms when performing ADLs;Long Term: Be able to perform more ADLs without symptoms or delay the onset of symptoms             Core Components/Risk Factors/Patient Goals Review:    Core Components/Risk Factors/Patient Goals at Discharge (Final Review):    ITP Comments: Dr.  Mechele Collin is Medical Director for Pulmonary Rehab at [facility].

## 2023-10-01 NOTE — Progress Notes (Signed)
 Pulmonary Rehab Orientation Physical Assessment Note    Well appearing, A&Ox4, NAD Eyes/Ears: states he is hard of hearing, has bilat hearing aids but not wearing  Lungs: Diminished with no wheezes, rales, rhonchi, denies chronic cough, dyspnea on exertion, wearing 2L  at rest and 3L with exertion Heart: Regular rate rhythm, no murmurs, no rubs, no clicks Gastrointestinal: abdomin soft, + bowel sounds in all 4 quads, denies recent weight gain or loss, decrease in appetite due to Esbriet, has nausea prn Genitourinary: WNL, pt denies s/s Extremities:  +2 pulses, grip strength equal, strong, no edema, no cyanosis, no clubbing Integumentary: pt denies any rashes, open or non healing wounds Psy/Soc: Pt denies any needs or resources at this time, states he feels sad about not being able to do the things he once did Assistive devices: Pt carriers portable oxygen concentrator (POC), has home concentrator

## 2023-10-01 NOTE — Progress Notes (Signed)
 Kenneth [name] 83 y.o. male Pulmonary Rehab Orientation Note This patient who was referred to Pulmonary Rehab by Dr. Evelina Dun with the diagnosis of IPF arrived today in Cardiac and Pulmonary Rehab. He arrived ambulatory with normal gait. He does carry portable oxygen. [employer] is the provider for their DME. Per patient, Kenneth [name] uses oxygen frequently. Color good, skin warm and dry. Patient is oriented to time and place. Patient's medical history, psychosocial health, and medications reviewed. Psychosocial assessment reveals patient lives with spouse. Oral is currently retired. Patient hobbies include spending time with others and play golf. Patient reports his stress level is low. Areas of stress/anxiety include health. Patient does not exhibit signs of depression. PHQ2/9 score 1/2. [name] shows good  coping skills with positive outlook on life. Offered emotional support and reassurance. Will continue to monitor. Physical assessment performed by Essie Hart RN. Please see their orientation physical assessment note. Tay reports he  does take medications as prescribed. Patient states he  follows a regular  diet. The patient reports no specific efforts to gain or lose weight.. Patient's weight will be monitored closely. Demonstration and practice of PLB using pulse oximeter. Kenneth [name] able to return demonstration satisfactorily. Safety and hand hygiene in the exercise area reviewed with patient. Kit voices understanding of the information reviewed. Department expectations discussed with patient and achievable goals were set. The patient shows enthusiasm about attending the program and we look forward to working with Kenneth [name]. Petro completed a 6 min walk test today and is scheduled to begin exercise on 10/05/23 at 10:15 am.  [id number] Kenneth San, MS, ACSM-CEP

## 2023-10-05 ENCOUNTER — Encounter (HOSPITAL_COMMUNITY)
Admission: RE | Admit: 2023-10-05 | Discharge: 2023-10-05 | Disposition: A | Discharge status: Home / Self Care | Source: Ambulatory Visit | Attending: Pulmonary Disease | Admitting: Pulmonary Disease

## 2023-10-05 VITALS — Wt 169.1 lb

## 2023-10-05 DIAGNOSIS — J84112 Idiopathic pulmonary fibrosis: Secondary | ICD-10-CM | POA: Diagnosis not present

## 2023-10-05 NOTE — Progress Notes (Signed)
 Daily Session Note  Patient Details  Name: Kenneth [name] MRN: [id number] Date of Birth: 01-23-1941 Referring Provider:   Doristine Devoid Pulmonary Rehab Walk Test from 10/01/2023 in [facility]  Referring Provider [name]  [[name]]       Encounter Date: 10/05/2023  Check In:  Session Check In - 10/05/23 1031       Check-In   Supervising physician immediately available to respond to emergencies [facility] MD immediately available    Physician(s) Jari Favre, PA    Location [facility]    Staff Present Essie Hart, RN, Doris Cheadle, MS, ACSM-CEP, Exercise Physiologist;Randi Idelle Crouch BS, ACSM-CEP, Exercise Physiologist;Samantha Belarus, RD, LDN    Virtual Visit No    Medication changes reported     No    Fall or balance concerns reported    No    Tobacco Cessation No Change    Warm-up and Cool-down Performed as group-led instruction    Resistance Training Performed Yes    VAD Patient? No    PAD/SET Patient? No      Pain Assessment   Currently in Pain? No/denies             Capillary Blood Glucose: No results found for this or any previous visit (from the past 24 hours).   Exercise Prescription Changes - 10/05/23 1200       Response to Exercise   Blood Pressure (Admit) 96/64    Blood Pressure (Exercise) 104/50    Blood Pressure (Exit) 104/64    Heart Rate (Admit) 91 bpm    Heart Rate (Exercise) 107 bpm    Heart Rate (Exit) 83 bpm    Oxygen Saturation (Admit) 99 %    Oxygen Saturation (Exercise) 95 %    Oxygen Saturation (Exit) 94 %    Rating of Perceived Exertion (Exercise) 11    Perceived Dyspnea (Exercise) 1    Duration Continue with 30 min of aerobic exercise without signs/symptoms of physical distress.    Intensity THRR unchanged      Progression   Progression Continue to progress workloads to maintain intensity without signs/symptoms of physical distress.      Resistance Training    Training Prescription Yes    Weight blue bands    Reps 10-15    Time 10 Minutes      Oxygen   Oxygen Continuous    Liters 4      Treadmill   MPH 1.8    Grade 0.5    Minutes 15    METs 2.2      Bike   Level 2    Minutes 15    METs 2.8      Oxygen   Maintain Oxygen Saturation 88% or higher             Social History   Tobacco Use  Smoking Status Never  Smokeless Tobacco Never    Goals Met:  Proper associated with RPD/PD & O2 Sat Independence with exercise equipment Exercise tolerated well No report of concerns or symptoms today Strength training completed today  Goals Unmet:  Not Applicable  Comments: Service time is from 1012 to 1136.    Dr. Mechele Collin is Medical Director for Pulmonary Rehab at [facility].

## 2023-10-06 DIAGNOSIS — R0902 Hypoxemia: Secondary | ICD-10-CM | POA: Diagnosis not present

## 2023-10-06 DIAGNOSIS — G4730 Sleep apnea, unspecified: Secondary | ICD-10-CM | POA: Diagnosis not present

## 2023-10-07 ENCOUNTER — Encounter (HOSPITAL_COMMUNITY)
Admission: RE | Admit: 2023-10-07 | Discharge: 2023-10-07 | Discharge status: Home / Self Care | Source: Ambulatory Visit | Attending: Pulmonary Disease

## 2023-10-07 DIAGNOSIS — J84112 Idiopathic pulmonary fibrosis: Secondary | ICD-10-CM | POA: Diagnosis not present

## 2023-10-07 NOTE — Progress Notes (Signed)
 Daily Session Note  Patient Details  Name: Kenneth [name] MRN: [id number] Date of Birth: 03-02-1941 Referring Provider:   Doristine Devoid Pulmonary Rehab Walk Test from 10/01/2023 in [facility]  Referring Provider [name]  [[name]]       Encounter Date: 10/07/2023  Check In:  Session Check In - 10/07/23 1020       Check-In   Supervising physician immediately available to respond to emergencies [facility] MD immediately available    Physician(s) Carlyon Shadow, NP    Location [facility]    Staff Present Essie Hart, RN, Doris Cheadle, MS, ACSM-CEP, Exercise Physiologist;Randi Dionisio Paschal, ACSM-CEP, Exercise Physiologist;Samantha Belarus, [place], Debbora Dus, [place]    Virtual Visit No    Medication changes reported     No    Fall or balance concerns reported    No    Tobacco Cessation No Change    Warm-up and Cool-down Performed as group-led instruction    Resistance Training Performed Yes    VAD Patient? No    PAD/SET Patient? No      Pain Assessment   Currently in Pain? No/denies             Capillary Blood Glucose: No results found for this or any previous visit (from the past 24 hours).    Social History   Tobacco Use  Smoking Status Never  Smokeless Tobacco Never    Goals Met:  Proper associated with RPD/PD & O2 Sat Exercise tolerated well No report of concerns or symptoms today Strength training completed today  Goals Unmet:  Not Applicable  Comments: Service time is from 1008 to 1132.    Dr. Mechele Collin is Medical Director for Pulmonary Rehab at [facility].

## 2023-10-08 ENCOUNTER — Telehealth: Payer: Self-pay | Admitting: Internal Medicine

## 2023-10-08 NOTE — Telephone Encounter (Signed)
 Received ONO on RA- [employer] 10/06/23   Sats <88% RA for 6 hours 44 min Sats <89% RA for 7 hours 7 hours 40 min   Pt does have o2 with sleep already  Routing to MR as FYI and will placed physical copy of results in sign folder

## 2023-10-11 ENCOUNTER — Encounter: Admitting: Internal Medicine

## 2023-10-11 ENCOUNTER — Encounter: Admitting: Adult Health

## 2023-10-11 ENCOUNTER — Encounter: Payer: Self-pay | Admitting: Adult Health

## 2023-10-11 ENCOUNTER — Other Ambulatory Visit: Payer: Self-pay | Admitting: *Deleted

## 2023-10-11 DIAGNOSIS — J84112 Idiopathic pulmonary fibrosis: Secondary | ICD-10-CM

## 2023-10-11 DIAGNOSIS — Z006 Encounter for examination for normal comparison and control in clinical research program: Secondary | ICD-10-CM

## 2023-10-11 MED ORDER — ONDANSETRON HCL 8 MG PO TABS: 8.0000 mg | ORAL_TABLET | Freq: Three times a day (TID) | ORAL | 1 refills | Status: DC | PRN

## 2023-10-11 NOTE — Research (Signed)
 Title:A Randomized, Double-blind, Placebo-controlled, Phase 3 Study of the Efficacy and Safety of Inhaled Treprostinil in Subjects with Idiopathic Pulmonary Fibrosis   Dose and Duration of Treatment:Treprostinil for Inhalation 0.6 mg/mL, or placebo (randomly assigned 1:1) over a 52 week period.  Protocol # RIN-PF-301; IND # [id number]; Clinical Trials.gov Identifier: [id number]  Sponsor: [employer]Research [place], [place] [address]   [facility] Clinical Research Coordinator note:   This visit for [name] with DOB: Dec 07, 1940 on 10/11/2023 for the above protocol is Visit/Encounter # Early termination visit  and is for purpose of research.   The consent for this encounter is under Protocol Version Amendment 4 dated: 26Jul2023 IB: Version 19 dated 08Jul2024 ICF: Ver. 3.0, 05 Feb 2022, WCG Ver. 3.1, 02 Apr 2022, IRB APPROVED AS MODIFIED Apr 07, 2022 Lab Manual: V4 dated 07Jun2023 is currently IRB approved.   Kenneth [name]  (Subject/LAR) expressed continued interest and consent in continuing as a study subject. Subject confirmed that there was  No  (NO/YES) change in contact information (e.g. address, telephone, email). Subject thanked for participation in research and contribution to science. In this visit 10/11/2023 the subject will be evaluated by Kenneth Parrett,NP (Sub-Investigator). This research coordinator has verified that the above investigator is up to date with his/her training logs.   The Subject was informed that the PI Kenneth [name] continues to have oversight of the subject's visits and course through relevant discussions, reviews, and also specifically of this visit by routing of this note to the PI.   1. This visit is a key visit of Early termination. The PI is NOT available for this visit.   2.  In addition, ahead of the key visit of Early termination the visit and subject were discussed with the PI Kenneth [name] on date of 23 Sep 2023 face to  face as part of direct PI oversight.  All assessments were done as per the protocol. Further details in the binder.    Signed by  Neita Garnet  Clinical Research Coordinator [facility]  [place], [place] 10:46 AM 10/11/2023

## 2023-10-11 NOTE — Assessment & Plan Note (Signed)
 Continue on current regimen and follow up with Dr. Marchelle Gearing

## 2023-10-11 NOTE — Assessment & Plan Note (Signed)
 Research visit for early termination of study.  Research protocol followed, see research paperwork.

## 2023-10-11 NOTE — Progress Notes (Signed)
 @Patient  ID: Kenneth [name], male    DOB: Dec 04, 1940, 83 y.o.   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  Protocol # RIN-PF-301; IND # [id number]; Clinical Trials.gov Identifier: [id number]  Sponsor: [employer]Research [place], [place] [address]  Key Features of the Drug: Treprostinil (LRX-15, 15AU81, UT-15) is a stable tricyclic benzindene analogue of the naturally occurring prostacyclin (PGI2; epoprostenol), a member of the eicosanoid family of autocoids; these compounds occur widely in tissues and have important pharmacological properties with potent activity, especially on the cardiovascular system and smooth muscle [name] 1996). An inhalation solution of treprostinil, 0.6 mg/mL, delivered using an ultrasonic nebulizer, is currently FDA approved to treat pulmonary arterial hypertension (PAH); [WHO Group 1, PH-ILD; WHO Group 3. Inhaled treprostinil is NOW being evaluated as primary treatment of IPF, idiopathic pulmonary fibrosis.   Key Inclusion Criteria: >=58 years of age Diagnosis of IPF based on the 2018 ATS/ERS/JRS/ALAT Clinical Practice Guideline (FVC >=45% predicted at Screening).  Subjects on pirfenidone or nintedanib must be on a stable and optimized dose for >=30 days prior to Baseline. Concomitant use of both pirfenidone and nintedanib is not  permitted. Males with a partner of childbearing potential must use a condom for the duration of treatment and for at least 48 hours after discontinuing study drug.   Key Exclusion Criteria: Subject is pregnant or lactating.  FEV1/FVC <0.70 at Screening.  Prior intolerance or significant lack of efficacy to a prostacyclin or prostacyclin analogue that resulted in discontinuation or inability to effectively titrate that therapy.  The subject has received any PAH-approved therapy, including prostacyclin therapy (epoprostenol, treprostinil, iloprost, or beraprost; except for acute vasoreactivity testing), IP receptor agonists (selexipag), endothelin receptor antagonists, phosphodiesterase type 5 inhibitors (PDE5-Is), or soluble guanylate cyclase stimulators within 60 days prior to Baseline. As needed use of a PDE5-I for erectile dysfunction is permitted, provided no doses are taken within 48 hours of any study-related efficacy assessments.  Use of any of the following medications:  `     a. Azathioprine (AZA), cyclosporine, mycophenolate mofetil, tacroliumus, oral           corticosteroids (OCS) >20 mg/day or the combination of            OCS+AZA+N-acetylcysteine within 30 days prior to Baseline.        b. Cyclophosphamide within 60 days prior to Baseline        c. Rituximab within 6 months prior to Baseline   Receiving >10 L/min of oxygen at rest at Baseline.   Exacerbation of IPF or active pulmonary or upper respiratory infection within 30 days prior to Baseline. Subjects must have completed any antibiotic or steroid regimens for treatment of the infection or acute exacerbation more than 30 days prior to Baseline to be eligible. If hospitalized for an acute exacerbation of IPF or a pulmonary or upper respiratory infection, subjects must have been discharged more than 90 days prior to Baseline to be  eligible.  Uncontrolled cardiac disease, defined as myocardial infarction within 6 months prior to  Baseline or unstable angina within 30 days prior to Baseline. Use of any investigational drug/device or participation in any investigational study in which the subject received a medical intervention (ie, procedure, device, medication/supplement) within 30 days prior to Screening. Subjects participating in non-interventional, observational, or registry studies are eligible.  Life expectancy <6 months due to IPF or a concomitant illness.  Acute pulmonary embolism within 90 days prior to Baseline.   Pharmacodynamics:  (Dose-Response Relationships) - In a clinical study of 241 healthy volunteers, single doses of Tyvaso 54 ?g (the target maintenance dose per session) and 84 ?g (supratherapeutic inhalation dose) prolonged the QTc interval by approximately 10 ms. The QTc effect dissipated rapidly as the concentration of treprostinil decreased.  Pharmacokinetics: (ADME) - Treprostinil is substantially metabolized by the liver, primarily by CYP2C8.  The elimination of treprostinil (following [place] administration of treprostinil) is biphasic, with a terminal elimination t1?2 of approximately 4 hours.    Contraindications:   None  Special Warnings/Considerations: Treprostinil undergoes substantial hepatic metabolism; thus, liver impairment could result in decreased metabolism and increased systemic exposure to treprostinil potentiating the occurrence and/or severity of AEs and/or overdose. There are no adequate and well-controlled studies with Tyvaso in pregnant women. There are risks to the mother and the fetus associated with PAH. Patients who discontinue the use of any effective therapy may be expected to notice a decline in their clinical status. Patients who abruptly discontinue therapy (ie, miss 2 or more doses), or markedly reduce the dose, are at risk for worsening of PAH symptoms or a rebound in [facility] [place]. There is no evidence to suggest or suspect that patients taking inhaled treprostinil would be at risk for  withdrawal symptoms or rebound effects.  Treprostinil inhibits platelet aggregation and increases the risk of bleeding, particularly with concomitant anticoagulants and antiplatelet therapies. Treprostinil is a pulmonary and systemic vasodilator. In patients with low systemic arterial pressure, inhaled treprostinil solution may cause symptomatic hypotension, which may present as but is not limited to light-headedness, dizziness, blurred or faded vision, and syncope. Concomitant administration of treprostinil with diuretics, antihypertensive agents, or other vasodilators that may lower blood pressure increases the risk of symptomatic hypotension. Signs and symptoms of overdose with inhaled treprostinil solution may correspond to dose-limiting pharmacologic effects, including diarrhea, flushing, headache, hypotension, nausea, and vomiting. Most events are self-limiting and resolve with reduction or withholding of inhaled treprostinil solution.  Interactions:              Drug Interaction Studies Conducted with Treprostinil: Study # P01:08: Treprostinil formulation [place] Remodulin vs Acetaminophen, to evaluate effect of acetaminophen on Treprostinil pK, showed no effect. Study # P01:12: Treprostinil formulation [place] Remodulin vs Warfarin, to evaluate effect of Warfarin on Treprostinil pK/PD, showed no effect. Study TDE-PH-105: Treprostinil formulation Orenitram vs Bosentan, to evaluate effect of Bosentan on Treprostinil pK, showed no effect. Study TDE-PH-106: Treprostinil formulation Orenitram vs Sildenafil, to evaluate effect of Sildenafil on Treprostinil pK, showed no effect. Study TDE-PH-109: Treprostinil formulation Orenitram vs Rifampicin (CYP2C8/9 inducer), to evaluate the effect of inducing the CYP2C8 metabolic pathway on the pK of Treprostinil, confirmed metabolic pathway via CYP2C8/9. Expected interaction resulting in reduced Cmax and AUC. Study TDE-PH-110: Treprostinil formulation Orenitram vs  Gemfibrozil (CYP2C8  inhibitor), to evaluate the effect of inhibiting the CYP2C8 metabolic pathway on the pK of Treprostinil, confirmed metabolic pathway primarily via CYP2C8. Expected interaction resulting in significantly increased Cmax and AUC. Study TDE-PH-110:  Treprostinil formulation Orenitram vs Fluconazole (CYP2C9 inhibitor), to evaluate the effect of inhibiting the CYP2C9 metabolic pathway on on the pK of Treprostinil, had no notable effect. Confirmed CYP2C9 plays a minor role in treprostinil metabolism.  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 No serious adverse reactions met criteria for inclusion in inhaled treprostinil solution clinical studies for interstitial lung disease. System/Organ Class: Nervous System disorders - Syncope in 3 of 115 subjects exposed (2.6%)      Serious Adverse Reactions Observed Postmarketing: Gastrointestinal disorders: vomiting, nausea, diarrhea, GI Hemorrhage (reflects an aggregate of the following: GI hemorrhage, rectal hemorrhage, blood in stool [hematochezia or melena], and hematemesis). General disorders and administration site conditions: Pain Infections and infestations: Pneumonia Nervous system disorders: Syncope, dizziness, headache Respiratory, thoracic and mediastinal disorders: Hemoptysis, cough, epistaxis Vascular disorders: Hypotension, hemorrhage (unspecified blood loss ranging from nondescript to heavy or excessive bleeding).  Safety Data:  As of 20 January 2021, approximately 955 subjects have received treprostinil by inhalation across various studies, ranging from acute studies in healthy volunteers to long-term treatment in subjects with PAH and PH-ILD.   The estimate of worldwide exposure to treprostinil in the marketed inhaled formulations is approximately 16,109,604 total patient treatment-days  (approximately 34,318 patient treatment-years). The most common adverse effects of inhaled treprostinil >=10% were cough, diarrhea, dizziness, flushing, headache, muscle/jaw/bone pain, nausea, pharyngolaryngeal pain, oropharyngeal pain, syncope, and throat irritation. Other adverse reactions in the observational study comparing subjects taking inhaled treprostinil and a control group were cough, hemoptysis, nasal discomfort, and throat irritation. Angioedema is a rare but serious reaction that has been seen in the post-marketing setting with treprostinil.  Stability:  Ampoules of drug product are stable until the date indicated when stored in the unopened foil pouch. The [place] commercial labeling states the following regarding storage conditions: Store at 20C to 25C (53F to 21F), with excursions permitted to 15C to 30C (59F to 29F).  Once the foil pack is opened, ampoules should be used within 7 days. Because Tyvaso is light-sensitive, unopened ampoules should be stored in the foil pouch.  This visit for [name] with DOB: 1940-10-09 on 10/11/2023 for the above protocol is Visit/Encounter #  Early termination visit   and is for purpose of research  . Subject/LAR expressed that he would like to end research study early.  Subject thanked for participation in research and contribution to science. He says he does want to continue through the study as it has become to hard for him to navigate with his breathing doing so bad. Study protocol followed for early termination. See research exam , EKG, PFT .  Patient admitted with Covid infection earlier this year and since then not been doing well. Now on Oxygen.     No Known Allergies  Immunization History  Administered Date(s) Administered   Fluad Quad(high Dose 65+) 03/18/2020   Influenza Split 10/03/2009, 03/28/2010, 04/14/2011, 03/22/2012, 03/09/2013, 03/28/2014   Influenza, High Dose Seasonal PF 03/19/2016, 03/06/2017, 04/09/2018,  03/07/2019, 04/06/2020, 04/11/2022   Influenza, Quadrivalent, Recombinant, Inj, Pf 03/12/2018, 03/10/2019, 04/06/2020, 04/07/2023   Influenza,inj,Quad PF,6+ Mos 04/04/2014, 03/26/2015, 04/05/2016   Influenza-Unspecified 04/05/2016, 04/05/2021, 04/25/2023   PFIZER Comirnaty(Gray Top)Covid-19 Tri-Sucrose Vaccine 10/15/2020   PFIZER(Purple Top)SARS-COV-2 Vaccination 07/20/2019, 08/10/2019, 02/27/2020, 03/23/2021   PNEUMOCOCCAL CONJUGATE-20 02/18/2021   Pfizer Covid-19 Vaccine Bivalent Booster 5y-11y 10/05/2022   Pneumococcal Conjugate-13 03/30/2013, 07/31/2013, 04/26/2018   Pneumococcal Polysaccharide-23 10/03/2009, 01/14/2010, 03/31/2018, 02/18/2021  Pneumococcal-Unspecified 04/05/2016   Td 10/20/2016   Tdap 10/03/2009   Unspecified SARS-COV-2 Vaccination 03/21/2023   Zoster Recombinant(Shingrix) 01/03/2017   Zoster, Live 10/03/2009    Past Medical History:  Diagnosis Date   Arthritis    Depression    Diffuse large B cell lymphoma (HCC) 2023   Diverticulosis of colon (without mention of hemorrhage)    GERD (gastroesophageal reflux disease)    Hiatal hernia    HLD (hyperlipidemia)    Internal hemorrhoids without mention of complication    Irritable bowel syndrome    Other specified gastritis without mention of hemorrhage    Prostate cancer (HCC) 2022   Pulmonary fibrosis (HCC)    Stress fracture 02/2021    Tobacco History: Social History   Tobacco Use  Smoking Status Never  Smokeless Tobacco Never   Counseling given: Not Answered   Outpatient Medications Prior to Visit  Medication Sig Dispense Refill   acetaminophen (TYLENOL) 500 MG tablet Take 1,000 mg by mouth every 6 (six) hours as needed for moderate pain.     acetaminophen-codeine (TYLENOL #3) 300-30 MG tablet Take 1 tablet by mouth every 4 (four) hours as needed (for pain).     albuterol (VENTOLIN HFA) 108 (90 Base) MCG/ACT inhaler Inhale 2 puffs into the lungs every 6 (six) hours as needed for wheezing or  shortness of breath. 6.7 g 2   aspirin 81 MG chewable tablet Chew 81 mg by mouth 3 (three) times a week.     carboxymethylcellulose (REFRESH TEARS) 0.5 % SOLN Place 1 drop into both eyes daily as needed (dry eyes).     carisoprodol (SOMA) 250 MG tablet Take 175-350 mg by mouth 3 (three) times daily as needed (for muscle spasms).     Cholecalciferol (VITAMIN D-3) 1000 UNITS CAPS Take 2,000 Units by mouth every evening.     CO-ENZYME Q-10 PO Take 1 tablet by mouth every evening.      Cyanocobalamin (VITAMIN B12) 1000 MCG TBCR Take 1,000 mcg by mouth daily.     denosumab (PROLIA) 60 MG/ML SOSY injection Inject 60 mg into the skin every 6 (six) months. At [facility]     desvenlafaxine (PRISTIQ) 50 MG 24 hr tablet Take 50 mg by mouth daily.     diclofenac (VOLTAREN) 75 MG EC tablet Take 75 mg by mouth 2 (two) times daily as needed (pain.).  2   diclofenac sodium (VOLTAREN) 1 % GEL Apply 4 g topically 4 (four) times daily as needed (pain.).  12   dicyclomine (BENTYL) 10 MG capsule Take one by mouth every 6 hours as needed for abdominal cramping 30 capsule 1   diphenoxylate-atropine (LOMOTIL) 2.5-0.025 MG tablet Take 1 tablet by mouth as needed for diarrhea or loose stools.     Emollient (AQUAPHOR ADV PROTECT HEALING EX) Apply 1 Application topically daily as needed (itching).     Evolocumab (REPATHA) 140 MG/ML SOSY Inject 140 mg into the skin every 30 (thirty) days.     Glucosamine Sulfate 500 MG TABS Take 500 mg by mouth daily.     HYDROcodone-acetaminophen (NORCO) 10-325 MG tablet Take 1 tablet by mouth See admin instructions. Take 1 tablet by mouth every 4-6 hours as needed for pain     Investigational - Study Medication Inhale 12 puffs into the lungs See admin instructions. Dr. Kalman Shan: SaTitle:A Randomized, Double-blind, Placebo-controlled, Phase 3 Study of the Efficacy and Safety of Inhaled Treprostinil in Subjects with Idiopathic Pulmonary Fibrosis Dose and Duration of  Treatment:Treprostinil  for Inhalation 0.6 mg/mL, or placebo (randomly assigned 1:1) over a 52 week period.  Protocol # RIN-PF-301; IND # [id number]; Clinical Trials.gov Identifier: [id number]  Inhale 12 puffs into the lungs 4 times a day     latanoprost (XALATAN) 0.005 % ophthalmic solution Place 1 drop into both eyes at bedtime.     loratadine (CLARITIN) 10 MG tablet Take 10 mg by mouth daily.     meclizine (ANTIVERT) 25 MG tablet Take 25 mg by mouth 3 (three) times daily as needed for dizziness.     metoprolol succinate (TOPROL XL) 25 MG 24 hr tablet Take 1 tablet (25 mg total) by mouth 2 (two) times daily. 60 tablet 11   Multiple Vitamin (MULTIVITAMIN) tablet Take 1 tablet by mouth daily with breakfast.     omeprazole (PRILOSEC) 40 MG capsule TAKE 1 CAPSULE (40 MG TOTAL) BY MOUTH DAILY. 90 capsule 0   Pirfenidone (ESBRIET) 801 MG TABS Take 1 tablet (801 mg total) by mouth 3 (three) times daily with meals. 270 tablet 1   Probiotic Product (ALIGN) 4 MG CAPS Take 4 mg by mouth daily.     SODIUM FLUORIDE 5000 PLUS 1.1 % CREA dental cream Place 1 Application onto teeth at bedtime.     XHANCE 93 MCG/ACT EXHU Place 1 spray into both nostrils 2 (two) times daily as needed (for sinus issues).     ondansetron (ZOFRAN) 8 MG tablet TAKE 1 TABLET BY MOUTH EVERY 8 HOURS AS NEEDED FOR NAUSEA AND VOMITING 30 tablet 1   No facility-administered medications prior to visit.       Physical Exam  See research  exam .     Imaging: DG Chest 2 View Result Date: 10/02/2023 CLINICAL DATA:  History of COVID and pulmonary fibrosis. EXAM: CHEST - 2 VIEW COMPARISON:  Chest x-ray 08/22/2023.  Chest CT 04/08/2023. FINDINGS: Chronic appearing interstitial opacities are seen bilaterally, predominantly peripherally. There is no superimposed lung infiltrate identified. The costophrenic angles are clear. There is no pleural effusion or pneumothorax. The cardiomediastinal silhouette is stable, the heart is enlarged.  Degenerative changes affect the spine. Cervical spinal fusion plate is present. IMPRESSION: Stable chronic appearing interstitial opacities. No superimposed lung infiltrate identified. Electronically Signed   By: Darliss Cheney M.D.   On: 10/02/2023 19:54    Administration History     None          Latest Ref Rng & Units 05/13/2023   12:56 PM 01/04/2023    8:51 AM 10/09/2022    4:21 PM 04/07/2022    1:46 PM 01/09/2022    9:17 AM 09/18/2021    8:52 AM 04/03/2021    9:21 AM  PFT Results  FVC-Pre L 2.37  2.27  2.33  2.53  2.49  2.68  2.96   FVC-Predicted Pre % 64  62  63  68  64  68  75   Pre FEV1/FVC % % 86  84  86  86  87  84  81   FEV1-Pre L 2.03  1.92  2.01  2.18  2.16  2.25  2.38   FEV1-Predicted Pre % 78  74  77  83  78  80  85   DLCO uncorrected ml/min/mmHg 11.71  10.66  11.48  12.36  12.55  13.07  16.15   DLCO UNC% % 51  46  50  53  53  54  67   DLCO corrected ml/min/mmHg 11.71  10.66  12.81  13.57  12.55  14.64  16.15   DLCO COR %Predicted % 51  46  55  59  53  61  67   DLVA Predicted % 81  77  94  89  91  105  91     No results found for: "NITRICOXIDE"      Assessment & Plan:     Rubye Oaks, NP 10/11/2023

## 2023-10-11 NOTE — Telephone Encounter (Signed)
 Faxed refill request received for ondansetron 8 mg tablets.

## 2023-10-11 NOTE — Patient Instructions (Signed)
 Follow up with Dr. Marchelle Gearing as planned and As needed

## 2023-10-12 ENCOUNTER — Encounter (HOSPITAL_COMMUNITY)
Admission: RE | Admit: 2023-10-12 | Discharge: 2023-10-12 | Disposition: A | Discharge status: Home / Self Care | Source: Ambulatory Visit | Attending: Pulmonary Disease

## 2023-10-12 DIAGNOSIS — J84112 Idiopathic pulmonary fibrosis: Secondary | ICD-10-CM | POA: Diagnosis not present

## 2023-10-12 NOTE — Progress Notes (Signed)
 Daily Session Note  Patient Details  Name: Kenneth [name] MRN: [id number] Date of Birth: Apr 17, 1941 Referring Provider:   Doristine Devoid Pulmonary Rehab Walk Test from 10/01/2023 in [facility]  Referring Provider [name]  [[name]]       Encounter Date: 10/12/2023  Check In:  Session Check In - 10/12/23 1106       Check-In   Supervising physician immediately available to respond to emergencies [facility] MD immediately available    Physician(s) Rise Paganini, NP    Location [facility]    Staff Present Essie Hart, RN, Doris Cheadle, MS, ACSM-CEP, Exercise Physiologist;Randi Idelle Crouch BS, ACSM-CEP, Exercise Physiologist;Samantha Belarus, RD, Debbora Dus, RT    Virtual Visit No    Medication changes reported     No    Fall or balance concerns reported    No    Tobacco Cessation No Change    Warm-up and Cool-down Performed as group-led instruction    Resistance Training Performed Yes    VAD Patient? No    PAD/SET Patient? No      Pain Assessment   Currently in Pain? No/denies    Multiple Pain Sites No             Capillary Blood Glucose: No results found for this or any previous visit (from the past 24 hours).    Social History   Tobacco Use  Smoking Status Never  Smokeless Tobacco Never    Goals Met:  Proper associated with RPD/PD & O2 Sat Exercise tolerated well No report of concerns or symptoms today Strength training completed today  Goals Unmet:  Not Applicable  Comments: Service time is from 1017 to 1130.    Dr. Mechele Collin is Medical Director for Pulmonary Rehab at [facility].

## 2023-10-12 NOTE — Telephone Encounter (Signed)
 He should definitely sleep with 2 L oxygen at night

## 2023-10-13 DIAGNOSIS — M5416 Radiculopathy, lumbar region: Secondary | ICD-10-CM | POA: Diagnosis not present

## 2023-10-13 NOTE — Telephone Encounter (Signed)
Spoke with pt and notified of results per Dr. Ramaswamy. Pt verbalized understanding and denied any questions.  

## 2023-10-14 ENCOUNTER — Encounter (HOSPITAL_COMMUNITY)
Admission: RE | Admit: 2023-10-14 | Discharge: 2023-10-14 | Disposition: A | Discharge status: Home / Self Care | Source: Ambulatory Visit | Attending: Pulmonary Disease

## 2023-10-14 DIAGNOSIS — J84112 Idiopathic pulmonary fibrosis: Secondary | ICD-10-CM | POA: Diagnosis not present

## 2023-10-14 NOTE — Progress Notes (Signed)
 Daily Session Note  Patient Details  Name: Kenneth [name] MRN: [id number] Date of Birth: 1941/03/13 Referring Provider:   Doristine Devoid Pulmonary Rehab Walk Test from 10/01/2023 in [facility]  Referring Provider [name]  [[name]]       Encounter Date: 10/14/2023  Check In:  Session Check In - 10/14/23 1021       Check-In   Supervising physician immediately available to respond to emergencies [facility] MD immediately available    Physician(s) Robin Searing NP    Location [facility]    Staff Present Essie Hart, RN, Doris Cheadle, MS, ACSM-CEP, Exercise Physiologist;Randi Dionisio Paschal, ACSM-CEP, Exercise Physiologist;Samantha Belarus, [place], Debbora Dus, [place]    Virtual Visit No    Medication changes reported     No    Fall or balance concerns reported    No    Tobacco Cessation No Change    Warm-up and Cool-down Performed as group-led instruction    Resistance Training Performed Yes    VAD Patient? No    PAD/SET Patient? No      Pain Assessment   Currently in Pain? No/denies             Capillary Blood Glucose: No results found for this or any previous visit (from the past 24 hours).    Social History   Tobacco Use  Smoking Status Never  Smokeless Tobacco Never    Goals Met:  Proper associated with RPD/PD & O2 Sat Independence with exercise equipment Exercise tolerated well No report of concerns or symptoms today Strength training completed today  Goals Unmet:  Not Applicable  Comments: Service time is from 1009 to 1137.    Dr. Mechele Collin is Medical Director for Pulmonary Rehab at [facility].

## 2023-10-19 ENCOUNTER — Encounter (HOSPITAL_COMMUNITY)
Admission: RE | Admit: 2023-10-19 | Discharge: 2023-10-19 | Disposition: A | Discharge status: Home / Self Care | Source: Ambulatory Visit | Attending: Pulmonary Disease | Admitting: Pulmonary Disease

## 2023-10-19 VITALS — Wt 167.5 lb

## 2023-10-19 DIAGNOSIS — J84112 Idiopathic pulmonary fibrosis: Secondary | ICD-10-CM

## 2023-10-19 NOTE — Progress Notes (Signed)
 Daily Session Note  Patient Details  Name: Kenneth [name] MRN: [id number] Date of Birth: 1941/04/30 Referring Provider:   Doristine Devoid Pulmonary Rehab Walk Test from 10/01/2023 in [facility]  Referring Provider [name]  [[name]]       Encounter Date: 10/19/2023  Check In:  Session Check In - 10/19/23 1028       Check-In   Supervising physician immediately available to respond to emergencies [facility] MD immediately available    Physician(s) Rise Paganini, NP    Location [facility]    Staff Present Essie Hart, RN, Doris Cheadle, MS, ACSM-CEP, Exercise Physiologist;Randi Idelle Crouch BS, ACSM-CEP, Exercise Physiologist;Casey Katrinka Blazing, RT    Virtual Visit No    Medication changes reported     No    Fall or balance concerns reported    No    Tobacco Cessation No Change    Warm-up and Cool-down Performed as group-led instruction    Resistance Training Performed Yes    VAD Patient? No    PAD/SET Patient? No      Pain Assessment   Currently in Pain? No/denies    Multiple Pain Sites No             Capillary Blood Glucose: No results found for this or any previous visit (from the past 24 hours).   Exercise Prescription Changes - 10/19/23 1100       Response to Exercise   Blood Pressure (Admit) 116/64    Blood Pressure (Exit) 100/56    Heart Rate (Admit) 83 bpm    Heart Rate (Exercise) 105 bpm    Heart Rate (Exit) 92 bpm    Oxygen Saturation (Admit) 99 %    Oxygen Saturation (Exercise) 87 %    Oxygen Saturation (Exit) 92 %    Rating of Perceived Exertion (Exercise) 13    Perceived Dyspnea (Exercise) 2    Duration Continue with 30 min of aerobic exercise without signs/symptoms of physical distress.    Intensity THRR unchanged      Progression   Progression Continue to progress workloads to maintain intensity without signs/symptoms of physical distress.      Resistance Training   Training  Prescription Yes    Weight blue bands    Reps 10-15    Time 10 Minutes      Oxygen   Oxygen Continuous    Liters 6      Treadmill   MPH 2    Grade 0    Minutes 15    METs 2.4      Bike   Level 2    Minutes 15    METs 2.8      Oxygen   Maintain Oxygen Saturation 88% or higher             Social History   Tobacco Use  Smoking Status Never  Smokeless Tobacco Never    Goals Met:  Proper associated with RPD/PD & O2 Sat Exercise tolerated well No report of concerns or symptoms today Strength training completed today  Goals Unmet:  Not Applicable  Comments: Service time is from 1009 to 1143.    Dr. Mechele Collin is Medical Director for Pulmonary Rehab at [facility].

## 2023-10-20 NOTE — Progress Notes (Signed)
 Pulmonary Individual Treatment Plan  Patient Details  Name: Kenneth [name] MRN: [id number] Date of Birth: 1941/06/09 Referring Provider:   Doristine Devoid Pulmonary Rehab Walk Test from 10/01/2023 in [facility]  Referring Provider [name]  [[name]]       Initial Encounter Date:  Flowsheet Row Pulmonary Rehab Walk Test from 10/01/2023 in [facility]  Date 10/01/23       Visit Diagnosis: 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HCC)  Patient's Home Medications on Admission:   Current Outpatient Medications:    acetaminophen (TYLENOL) 500 MG tablet, Take 1,000 mg by mouth every 6 (six) hours as needed for moderate pain., Disp: , Rfl:    acetaminophen-codeine (TYLENOL #3) 300-30 MG tablet, Take 1 tablet by mouth every 4 (four) hours as needed (for pain)., Disp: , Rfl:    albuterol (VENTOLIN HFA) 108 (90 Base) MCG/ACT inhaler, Inhale 2 puffs into the lungs every 6 (six) hours as needed for wheezing or shortness of breath., Disp: 6.7 g, Rfl: 2   aspirin 81 MG chewable tablet, Chew 81 mg by mouth 3 (three) times a week., Disp: , Rfl:    carboxymethylcellulose (REFRESH TEARS) 0.5 % SOLN, Place 1 drop into both eyes daily as needed (dry eyes)., Disp: , Rfl:    carisoprodol (SOMA) 250 MG tablet, Take 175-350 mg by mouth 3 (three) times daily as needed (for muscle spasms)., Disp: , Rfl:    Cholecalciferol (VITAMIN D-3) 1000 UNITS CAPS, Take 2,000 Units by mouth every evening., Disp: , Rfl:    CO-ENZYME Q-10 PO, Take 1 tablet by mouth every evening. , Disp: , Rfl:    Cyanocobalamin (VITAMIN B12) 1000 MCG TBCR, Take 1,000 mcg by mouth daily., Disp: , Rfl:    denosumab (PROLIA) 60 MG/ML SOSY injection, Inject 60 mg into the skin every 6 (six) months. At [facility], Disp: , Rfl:    desvenlafaxine (PRISTIQ) 50 MG 24 hr tablet, Take 50 mg by mouth daily., Disp: , Rfl:    diclofenac  (VOLTAREN) 75 MG EC tablet, Take 75 mg by mouth 2 (two) times daily as needed (pain.)., Disp: , Rfl: 2   diclofenac sodium (VOLTAREN) 1 % GEL, Apply 4 g topically 4 (four) times daily as needed (pain.)., Disp: , Rfl: 12   dicyclomine (BENTYL) 10 MG capsule, Take one by mouth every 6 hours as needed for abdominal cramping, Disp: 30 capsule, Rfl: 1   diphenoxylate-atropine (LOMOTIL) 2.5-0.025 MG tablet, Take 1 tablet by mouth as needed for diarrhea or loose stools., Disp: , Rfl:    Emollient (AQUAPHOR ADV PROTECT HEALING EX), Apply 1 Application topically daily as needed (itching)., Disp: , Rfl:    Evolocumab (REPATHA) 140 MG/ML SOSY, Inject 140 mg into the skin every 30 (thirty) days., Disp: , Rfl:    Glucosamine Sulfate 500 MG TABS, Take 500 mg by mouth daily., Disp: , Rfl:    HYDROcodone-acetaminophen (NORCO) 10-325 MG tablet, Take 1 tablet by mouth See admin instructions. Take 1 tablet by mouth every 4-6 hours as needed for pain, Disp: , Rfl:    Investigational - Study Medication, Inhale 12 puffs into the lungs See admin instructions. Dr. Kalman Shan: SaTitle:A Randomized, Double-blind, Placebo-controlled, Phase 3 Study of the Efficacy and Safety of Inhaled Treprostinil in Subjects with Idiopathic Pulmonary Fibrosis Dose and Duration of Treatment:Treprostinil for Inhalation 0.6 mg/mL, or placebo (randomly assigned 1:1) over a 52 week period.  Protocol # [id number]-301; IND # [id number]; Clinical Trials.gov Identifier: [id number]  Inhale 12 puffs into the lungs 4 times a day, Disp: , Rfl:    latanoprost (XALATAN) 0.005 % ophthalmic solution, Place 1 drop into both eyes at bedtime., Disp: , Rfl:    loratadine (CLARITIN) 10 MG tablet, Take 10 mg by mouth daily., Disp: , Rfl:    meclizine (ANTIVERT) 25 MG tablet, Take 25 mg by mouth 3 (three) times daily as needed for dizziness., Disp: , Rfl:    metoprolol succinate (TOPROL XL) 25 MG 24 hr tablet, Take 1 tablet (25 mg total) by mouth 2 (two) times daily., Disp:  60 tablet, Rfl: 11   Multiple Vitamin (MULTIVITAMIN) tablet, Take 1 tablet by mouth daily with breakfast., Disp: , Rfl:    omeprazole (PRILOSEC) 40 MG capsule, TAKE 1 CAPSULE (40 MG TOTAL) BY MOUTH DAILY., Disp: 90 capsule, Rfl: 0   ondansetron (ZOFRAN) 8 MG tablet, Take 1 tablet (8 mg total) by mouth every 8 (eight) hours as needed for nausea or vomiting., Disp: 30 tablet, Rfl: 1   Pirfenidone (ESBRIET) 801 MG TABS, Take 1 tablet (801 mg total) by mouth 3 (three) times daily with meals., Disp: 270 tablet, Rfl: 1   Probiotic Product (ALIGN) 4 MG CAPS, Take 4 mg by mouth daily., Disp: , Rfl:    SODIUM FLUORIDE 5000 PLUS 1.1 % CREA dental cream, Place 1 Application onto teeth at bedtime., Disp: , Rfl:    XHANCE 93 MCG/ACT EXHU, Place 1 spray into both nostrils 2 (two) times daily as needed (for sinus issues)., Disp: , Rfl:   Past Medical History: Past Medical History:  Diagnosis Date   Arthritis    Depression    Diffuse large B cell lymphoma (HCC) 2023   Diverticulosis of colon (without mention of hemorrhage)    GERD (gastroesophageal reflux disease)    Hiatal hernia    HLD (hyperlipidemia)    Internal hemorrhoids without mention of complication    Irritable bowel syndrome    Other specified gastritis without mention of hemorrhage    Prostate cancer (HCC) 2022   Pulmonary fibrosis (HCC)    Stress fracture 02/2021    Tobacco Use: Social History   Tobacco Use  Smoking Status Never  Smokeless Tobacco Never    Labs: Review Flowsheet       Latest Ref Rng & Units 04/14/2022 12/01/2022 08/22/2023  Labs for ITP Cardiac and Pulmonary Rehab  Cholestrol 0 - 200 mg/dL - 161  -  LDL (calc) 0 - 99 mg/dL - 68  -  HDL-C >09 mg/dL - 50  -  Trlycerides <604 mg/dL - 540  -  Bicarbonate 98.1 - 28.0 mmol/L 26.3  25.1  - 23.6   TCO2 22 - 32 mmol/L 28  26  - 25   O2 Saturation % 76  75  - 97     Details       Multiple values from one day are sorted in reverse-chronological order          Capillary Blood Glucose: Lab Results  Component Value Date   GLUCAP 119 (H) 09/29/2021   GLUCAP 118 (H) 05/26/2021     Pulmonary Assessment Scores:  Pulmonary Assessment Scores     Row Name 10/01/23 0940         ADL UCSD   ADL Phase Entry     SOB Score total 47       CAT Score   CAT Score 12  mMRC Score   mMRC Score 1             UCSD: Self-administered rating of dyspnea associated with activities of daily living (ADLs) 6-point scale (0 = "not at all" to 5 = "maximal or unable to do because of breathlessness")  Scoring Scores range from 0 to 120.  Minimally important difference is 5 units  CAT: CAT can identify the health impairment of COPD patients and is better correlated with disease progression.  CAT has a scoring range of zero to 40. The CAT score is classified into four groups of low (less than 10), medium (10 - 20), high (21-30) and very high (31-40) based on the impact level of disease on health status. A CAT score over 10 suggests significant symptoms.  A worsening CAT score could be explained by an exacerbation, poor medication adherence, poor inhaler technique, or progression of COPD or comorbid conditions.  CAT MCID is 2 points  mMRC: mMRC (Modified Medical Research Council) Dyspnea Scale is used to assess the degree of baseline functional disability in patients of respiratory disease due to dyspnea. No minimal important difference is established. A decrease in score of 1 point or greater is considered a positive change.   Pulmonary Function Assessment:  Pulmonary Function Assessment - 10/01/23 1400       Breath   Bilateral Breath Sounds Decreased    Shortness of Breath Yes;Limiting activity             Exercise Target Goals: Exercise Program Goal: Individual exercise prescription set using results from initial 6 min walk test and THRR while considering  patient's activity barriers and safety.   Exercise Prescription Goal: Initial  exercise prescription builds to 30-45 minutes a day of aerobic activity, 2-3 days per week.  Home exercise guidelines will be given to patient during program as part of exercise prescription that the participant will acknowledge.  Activity Barriers & Risk Stratification:  Activity Barriers & Cardiac Risk Stratification - 10/01/23 0928       Activity Barriers & Cardiac Risk Stratification   Activity Barriers Back Problems;Shortness of Breath;Deconditioning;Muscular Weakness             6 Minute Walk:  6 Minute Walk     Row Name 10/01/23 1030         6 Minute Walk   Phase Initial     Distance 810 feet     Walk Time 6 minutes     # of Rest Breaks 2  1:41-2:00, 3:36-4:03     MPH 1.53     METS 1.87     RPE 13     Perceived Dyspnea  2     VO2 Peak 6.56     Symptoms No     Resting HR 110 bpm     Resting BP 122/64     Resting Oxygen Saturation  91 %     Exercise Oxygen Saturation  during 6 min walk 86 %     Max Ex. HR 122 bpm     Max Ex. BP 132/70     2 Minute Post BP 128/70       Interval HR   1 Minute HR 110     2 Minute HR 96     3 Minute HR 120     4 Minute HR 122     5 Minute HR 97     6 Minute HR 121     2 Minute Post HR 92  Interval Heart Rate? Yes       Interval Oxygen   Interval Oxygen? Yes     Baseline Oxygen Saturation % 91 %     1 Minute Oxygen Saturation % 87 %     1 Minute Liters of Oxygen 0 L  increased to 1L     2 Minute Oxygen Saturation % 86 %     2 Minute Liters of Oxygen 1 L  increased to 2L     3 Minute Oxygen Saturation % 88 %     3 Minute Liters of Oxygen 2 L  increased to 3L     4 Minute Oxygen Saturation % 89 %     4 Minute Liters of Oxygen 3 L     5 Minute Oxygen Saturation % 91 %     5 Minute Liters of Oxygen 3 L     6 Minute Oxygen Saturation % 89 %     6 Minute Liters of Oxygen 3 L     2 Minute Post Oxygen Saturation % 94 %     2 Minute Post Liters of Oxygen 3 L              Oxygen Initial Assessment:  Oxygen Initial  Assessment - 10/01/23 0925       Home Oxygen   Home Oxygen Device Portable Concentrator;Home Concentrator    Sleep Oxygen Prescription Continuous    Liters per minute 2    Home Exercise Oxygen Prescription Pulsed    Liters per minute 2    Home Resting Oxygen Prescription None    Compliance with Home Oxygen Use Yes      Initial 6 min Walk   Oxygen Used Continuous    Liters per minute 2      Program Oxygen Prescription   Program Oxygen Prescription Continuous    Liters per minute 2      Intervention   Short Term Goals To learn and exhibit compliance with exercise, home and travel O2 prescription;To learn and understand importance of maintaining oxygen saturations>88%;To learn and demonstrate proper use of respiratory medications;To learn and understand importance of monitoring SPO2 with pulse oximeter and demonstrate accurate use of the pulse oximeter.;To learn and demonstrate proper pursed lip breathing techniques or other breathing techniques.     Long  Term Goals Exhibits compliance with exercise, home  and travel O2 prescription;Verbalizes importance of monitoring SPO2 with pulse oximeter and return demonstration;Exhibits proper breathing techniques, such as pursed lip breathing or other method taught during program session;Maintenance of O2 saturations>88%;Compliance with respiratory medication;Demonstrates proper use of MDI's             Oxygen Re-Evaluation:  Oxygen Re-Evaluation     Row Name 10/13/23 0853             Program Oxygen Prescription   Program Oxygen Prescription Continuous       Liters per minute 2         Home Oxygen   Home Oxygen Device Portable Concentrator;Home Concentrator       Sleep Oxygen Prescription Continuous       Liters per minute 2       Home Exercise Oxygen Prescription Pulsed       Liters per minute 2       Home Resting Oxygen Prescription None       Compliance with Home Oxygen Use Yes         Goals/Expected Outcomes   Short Term  Goals To  learn and exhibit compliance with exercise, home and travel O2 prescription;To learn and understand importance of maintaining oxygen saturations>88%;To learn and demonstrate proper use of respiratory medications;To learn and understand importance of monitoring SPO2 with pulse oximeter and demonstrate accurate use of the pulse oximeter.;To learn and demonstrate proper pursed lip breathing techniques or other breathing techniques.        Long  Term Goals Exhibits compliance with exercise, home  and travel O2 prescription;Verbalizes importance of monitoring SPO2 with pulse oximeter and return demonstration;Exhibits proper breathing techniques, such as pursed lip breathing or other method taught during program session;Maintenance of O2 saturations>88%;Compliance with respiratory medication;Demonstrates proper use of MDI's       Goals/Expected Outcomes Compliance and understanding of oxygen saturation monitoring and breathing techniques to decrease shortness of breath.                Oxygen Discharge (Final Oxygen Re-Evaluation):  Oxygen Re-Evaluation - 10/13/23 0853       Program Oxygen Prescription   Program Oxygen Prescription Continuous    Liters per minute 2      Home Oxygen   Home Oxygen Device Portable Concentrator;Home Concentrator    Sleep Oxygen Prescription Continuous    Liters per minute 2    Home Exercise Oxygen Prescription Pulsed    Liters per minute 2    Home Resting Oxygen Prescription None    Compliance with Home Oxygen Use Yes      Goals/Expected Outcomes   Short Term Goals To learn and exhibit compliance with exercise, home and travel O2 prescription;To learn and understand importance of maintaining oxygen saturations>88%;To learn and demonstrate proper use of respiratory medications;To learn and understand importance of monitoring SPO2 with pulse oximeter and demonstrate accurate use of the pulse oximeter.;To learn and demonstrate proper pursed lip breathing  techniques or other breathing techniques.     Long  Term Goals Exhibits compliance with exercise, home  and travel O2 prescription;Verbalizes importance of monitoring SPO2 with pulse oximeter and return demonstration;Exhibits proper breathing techniques, such as pursed lip breathing or other method taught during program session;Maintenance of O2 saturations>88%;Compliance with respiratory medication;Demonstrates proper use of MDI's    Goals/Expected Outcomes Compliance and understanding of oxygen saturation monitoring and breathing techniques to decrease shortness of breath.             Initial Exercise Prescription:  Initial Exercise Prescription - 10/01/23 1000       Date of Initial Exercise RX and Referring Provider   Date 10/01/23    Referring Provider Lyndon Code   Expected Discharge Date 12/21/23      Oxygen   Oxygen Continuous    Liters 3    Maintain Oxygen Saturation 88% or higher      Treadmill   MPH 1.8    Grade 0    Minutes 15    METs 2      Bike   Level 1    Watts 20    Minutes 40    METs 1.5      Prescription Details   Frequency (times per week) 2    Duration Progress to 30 minutes of continuous aerobic without signs/symptoms of physical distress      Intensity   THRR 40-80% of Max Heartrate 56-135    Ratings of Perceived Exertion 11-13    Perceived Dyspnea 0-4      Progression   Progression Continue to progress workloads to maintain intensity without signs/symptoms of physical distress.  Resistance Training   Training Prescription Yes    Weight blue bands    Reps 10-15             Perform Capillary Blood Glucose checks as needed.  Exercise Prescription Changes:   Exercise Prescription Changes     Row Name 10/05/23 1200 10/19/23 1100           Response to Exercise   Blood Pressure (Admit) 96/64 116/64      Blood Pressure (Exercise) 104/50 --      Blood Pressure (Exit) 104/64 100/56      Heart Rate (Admit) 91 bpm 83 bpm       Heart Rate (Exercise) 107 bpm 105 bpm      Heart Rate (Exit) 83 bpm 92 bpm      Oxygen Saturation (Admit) 99 % 99 %      Oxygen Saturation (Exercise) 95 % 87 %      Oxygen Saturation (Exit) 94 % 92 %      Rating of Perceived Exertion (Exercise) 11 13      Perceived Dyspnea (Exercise) 1 2      Duration Continue with 30 min of aerobic exercise without signs/symptoms of physical distress. Continue with 30 min of aerobic exercise without signs/symptoms of physical distress.      Intensity THRR unchanged THRR unchanged        Progression   Progression Continue to progress workloads to maintain intensity without signs/symptoms of physical distress. Continue to progress workloads to maintain intensity without signs/symptoms of physical distress.        Resistance Training   Training Prescription Yes Yes      Weight blue bands blue bands      Reps 10-15 10-15      Time 10 Minutes 10 Minutes        Oxygen   Oxygen Continuous Continuous      Liters 4 6        Treadmill   MPH 1.8 2      Grade 0.5 0      Minutes 15 15      METs 2.2 2.4        Bike   Level 2 2      Minutes 15 15      METs 2.8 2.8        Oxygen   Maintain Oxygen Saturation 88% or higher 88% or higher               Exercise Comments:   Exercise Comments     Row Name 10/05/23 1209           Exercise Comments Pt completed his first day of group exercise. He walked on the treadmill for 15 min. Initially 1.8 mph, 1.0 incline. However pt SpO2 86 2L therefore decreased incline to 0 %. METs 2.5 to 2.1. He then exercised on the scibike for 15 min, level 2, METs 2.8. Tolerated well. Performed warm up and cool down without limitations, including squats. Discussed METs with good reception. Pt motivated.                Exercise Goals and Review:   Exercise Goals     Row Name 10/01/23 [id number]             Exercise Goals   Increase Physical Activity Yes       Intervention Provide advice, education, support  and counseling about physical activity/exercise needs.;Develop an individualized exercise prescription for aerobic and resistive  training based on initial evaluation findings, risk stratification, comorbidities and participant's personal goals.       Expected Outcomes Short Term: Attend rehab on a regular basis to increase amount of physical activity.;Long Term: Add in home exercise to make exercise part of routine and to increase amount of physical activity.;Long Term: Exercising regularly at least 3-5 days a week.       Increase Strength and Stamina Yes       Intervention Provide advice, education, support and counseling about physical activity/exercise needs.;Develop an individualized exercise prescription for aerobic and resistive training based on initial evaluation findings, risk stratification, comorbidities and participant's personal goals.       Expected Outcomes Short Term: Increase workloads from initial exercise prescription for resistance, speed, and METs.;Short Term: Perform resistance training exercises routinely during rehab and add in resistance training at home;Long Term: Improve cardiorespiratory fitness, muscular endurance and strength as measured by increased METs and functional capacity ( )       Able to understand and use rate of perceived exertion (RPE) scale Yes       Intervention Provide education and explanation on how to use RPE scale       Expected Outcomes Short Term: Able to use RPE daily in rehab to express subjective intensity level;Long Term:  Able to use RPE to guide intensity level when exercising independently       Able to understand and use Dyspnea scale Yes       Intervention Provide education and explanation on how to use Dyspnea scale       Expected Outcomes Short Term: Able to use Dyspnea scale daily in rehab to express subjective sense of shortness of breath during exertion;Long Term: Able to use Dyspnea scale to guide intensity level when exercising  independently       Knowledge and understanding of Target Heart Rate Range (THRR) Yes       Intervention Provide education and explanation of THRR including how the numbers were predicted and where they are located for reference       Expected Outcomes Short Term: Able to state/look up THRR;Short Term: Able to use daily as guideline for intensity in rehab;Long Term: Able to use THRR to govern intensity when exercising independently       Understanding of Exercise Prescription Yes       Intervention Provide education, explanation, and written materials on patient's individual exercise prescription       Expected Outcomes Short Term: Able to explain program exercise prescription;Long Term: Able to explain home exercise prescription to exercise independently                Exercise Goals Re-Evaluation :  Exercise Goals Re-Evaluation     Row Name 10/13/23 0851             Exercise Goal Re-Evaluation   Exercise Goals Review Increase Physical Activity;Able to understand and use Dyspnea scale;Understanding of Exercise Prescription;Increase Strength and Stamina;Knowledge and understanding of Target Heart Rate Range (THRR);Able to understand and use rate of perceived exertion (RPE) scale       Comments Kenneth [name] has completed 3 exercise sessions. He exercises for 15 min on the treadmill and upright bike. He averages 2.2 METs at 1.8 mph on the treadmill and 2.8 METs at level 2 on the recumbent elliptical. Kenneth [name] performs the warmup and cooldown standing without limitations. It is too soon to notate any discernable progressions. Will continue to monitor and progress as able.  Expected Outcomes Through exercise at rehab and home, the patient will decrease shortness of breath with daily activities and feel confident in carrying out an exercise regimen at home.                Discharge Exercise Prescription (Final Exercise Prescription Changes):  Exercise Prescription Changes - 10/19/23 1100        Response to Exercise   Blood Pressure (Admit) 116/64    Blood Pressure (Exit) 100/56    Heart Rate (Admit) 83 bpm    Heart Rate (Exercise) 105 bpm    Heart Rate (Exit) 92 bpm    Oxygen Saturation (Admit) 99 %    Oxygen Saturation (Exercise) 87 %    Oxygen Saturation (Exit) 92 %    Rating of Perceived Exertion (Exercise) 13    Perceived Dyspnea (Exercise) 2    Duration Continue with 30 min of aerobic exercise without signs/symptoms of physical distress.    Intensity THRR unchanged      Progression   Progression Continue to progress workloads to maintain intensity without signs/symptoms of physical distress.      Resistance Training   Training Prescription Yes    Weight blue bands    Reps 10-15    Time 10 Minutes      Oxygen   Oxygen Continuous    Liters 6      Treadmill   MPH 2    Grade 0    Minutes 15    METs 2.4      Bike   Level 2    Minutes 15    METs 2.8      Oxygen   Maintain Oxygen Saturation 88% or higher             Nutrition:  Target Goals: Understanding of nutrition guidelines, daily intake of sodium 1500mg , cholesterol 200mg , calories 30% from fat and 7% or less from saturated fats, daily to have 5 or more servings of fruits and vegetables.  Biometrics:    Nutrition Therapy Plan and Nutrition Goals:  Nutrition Therapy & Goals - 10/05/23 1155       Nutrition Therapy   Diet General Healthy Diet      Personal Nutrition Goals   Nutrition Goal Patient to maintain weight throughout pulmonary rehab and understand strategies for weight maintenance/weight gain as needed.    Comments Kenneth [name] has medical history of IPF, lymphoma, OSA, CAD, aortic stenosis, diastolic heart failure. Lipids are well controlled on repatha. He does report side effects of nausea, diarrhea, decreased appetitie on esbriet. He reports that weight has been stable over the last few years; will continue to monitor weight and appetitie. He lives at [facility] and typically eats 1-2  meals on campus. Patient will benefit from participation in pulmonary rehab for nutrition, exercise, and lifestyle modification.      Intervention Plan   Intervention Prescribe, educate and counsel regarding individualized specific dietary modifications aiming towards targeted core components such as weight, hypertension, lipid management, diabetes, heart failure and other comorbidities.;Nutrition handout(s) given to patient.    Expected Outcomes Short Term Goal: Understand basic principles of dietary content, such as calories, fat, sodium, cholesterol and nutrients.;Long Term Goal: Adherence to prescribed nutrition plan.             Nutrition Assessments:  Nutrition Assessments - 10/05/23 1205       Rate Your Plate Scores   Pre Score 52            MEDIFICTS Score Key: >=  70 Need to make dietary changes  40-70 Heart Healthy Diet <= 40 Therapeutic Level Cholesterol Diet  Flowsheet Row PULMONARY REHAB OTHER RESPIRATORY from 10/05/2023 in [facility]  Picture Your Plate Total Score on Admission 52      Picture Your Plate Scores: <09 Unhealthy dietary pattern with much room for improvement. 41-50 Dietary pattern unlikely to meet recommendations for good health and room for improvement. 51-60 More healthful dietary pattern, with some room for improvement.  >60 Healthy dietary pattern, although there may be some specific behaviors that could be improved.    Nutrition Goals Re-Evaluation:  Nutrition Goals Re-Evaluation     Row Name 10/05/23 1155             Goals   Current Weight 167 lb 8.8 oz (76 kg)       Comment triglycerides 172, LDL68       Expected Outcome Kenneth [name] has medical history of IPF, lymphoma, OSA, CAD, aortic stenosis, diastolic heart failure. Lipids are well controlled on repatha. He does report side effects of nausea, diarrhea, decreased appetitie on esbriet. He reports that weight has been stable over  the last few years; will continue to monitor weight and appetitie. He lives at [facility] and typically eats 1-2 meals on campus. Patient will benefit from participation in pulmonary rehab for nutrition, exercise, and lifestyle modification.                Nutrition Goals Discharge (Final Nutrition Goals Re-Evaluation):  Nutrition Goals Re-Evaluation - 10/05/23 1155       Goals   Current Weight 167 lb 8.8 oz (76 kg)    Comment triglycerides 172, LDL68    Expected Outcome Kenneth [name] has medical history of IPF, lymphoma, OSA, CAD, aortic stenosis, diastolic heart failure. Lipids are well controlled on repatha. He does report side effects of nausea, diarrhea, decreased appetitie on esbriet. He reports that weight has been stable over the last few years; will continue to monitor weight and appetitie. He lives at [facility] and typically eats 1-2 meals on campus. Patient will benefit from participation in pulmonary rehab for nutrition, exercise, and lifestyle modification.             Psychosocial: Target Goals: Acknowledge presence or absence of significant depression and/or stress, maximize coping skills, provide positive support system. Participant is able to verbalize types and ability to use techniques and skills needed for reducing stress and depression.  Initial Review & Psychosocial Screening:  Initial Psych Review & Screening - 10/01/23 0931       Initial Review   Current issues with None Identified      Family Dynamics   Good Support System? Yes    Comments spouse and children      Barriers   Psychosocial barriers to participate in program There are no identifiable barriers or psychosocial needs.      Screening Interventions   Interventions Encouraged to exercise             Quality of Life Scores:  Scores of 19 and below usually indicate a poorer quality of life in these areas.  A difference of  2-3 points is a clinically meaningful difference.  A difference of 2-3  points in the total score of the Quality of Life Index has been associated with significant improvement in overall quality of life, self-image, physical symptoms, and general health in studies assessing change in quality of life.  PHQ-9: Review Flowsheet  05/21/2017  Depression screen PHQ 2/9  Decreased Interest 0  Down, Depressed, Hopeless 0  PHQ - 2 Score 0   Interpretation of Total Score  Total Score Depression Severity:  1-4 = Minimal depression, 5-9 = Mild depression, 10-14 = Moderate depression, 15-19 = Moderately severe depression, 20-27 = Severe depression   Psychosocial Evaluation and Intervention:  Psychosocial Evaluation - 10/01/23 0931       Psychosocial Evaluation & Interventions   Interventions Encouraged to exercise with the program and follow exercise prescription    Comments Ned denies any psychosocial barriers at this time.    Expected Outcomes patient will remain free from psychosocial barriers to participation in pulmonary rehab    Continue Psychosocial Services  No Follow up required             Psychosocial Re-Evaluation:  Psychosocial Re-Evaluation     Row Name 10/13/23 1117             Psychosocial Re-Evaluation   Current issues with None Identified       Comments Kenneth [name] denies any psychosocial barriers or concerns at this time.       Expected Outcomes For Kenneth [name] to participate in PR free of any psychosocial barriers or concerns       Interventions Encouraged to attend Pulmonary Rehabilitation for the exercise       Continue Psychosocial Services  No Follow up required                Psychosocial Discharge (Final Psychosocial Re-Evaluation):  Psychosocial Re-Evaluation - 10/13/23 1117       Psychosocial Re-Evaluation   Current issues with None Identified    Comments Kenneth [name] denies any psychosocial barriers or concerns at this time.    Expected Outcomes For Kenneth [name] to participate in PR free of any psychosocial barriers or concerns     Interventions Encouraged to attend Pulmonary Rehabilitation for the exercise    Continue Psychosocial Services  No Follow up required             Education: Education Goals: Education classes will be provided on a weekly basis, covering required topics. Participant will state understanding/return demonstration of topics presented.  Learning Barriers/Preferences:  Learning Barriers/Preferences - 10/01/23 0932       Learning Barriers/Preferences   Learning Barriers None    Learning Preferences Written Material;Verbal Instruction;Group Instruction;Individual Instruction             Education Topics: Know Your Numbers Group instruction that is supported by a PowerPoint presentation. Instructor discusses importance of knowing and understanding resting, exercise, and post-exercise oxygen saturation, heart rate, and blood pressure. Oxygen saturation, heart rate, blood pressure, rating of perceived exertion, and dyspnea are reviewed along with a normal range for these values.  Flowsheet Row PULMONARY REHAB OTHER RESPIRATORY from 10/07/2023 in [facility]  Date 10/07/23  Educator EP  Instruction Review Code 1- Verbalizes Understanding       Exercise for the Pulmonary Patient Group instruction that is supported by a PowerPoint presentation. Instructor discusses benefits of exercise, core components of exercise, frequency, duration, and intensity of an exercise routine, importance of utilizing pulse oximetry during exercise, safety while exercising, and options of places to exercise outside of rehab.    MET Level  Group instruction provided by PowerPoint, verbal discussion, and written material to support subject matter. Instructor reviews what METs are and how to increase METs.    Pulmonary Medications Verbally interactive group  education provided by instructor with focus on inhaled medications and proper  administration.   Anatomy and Physiology of the Respiratory System Group instruction provided by PowerPoint, verbal discussion, and written material to support subject matter. Instructor reviews respiratory cycle and anatomical components of the respiratory system and their functions. Instructor also reviews differences in obstructive and restrictive respiratory diseases with examples of each.    Oxygen Safety Group instruction provided by PowerPoint, verbal discussion, and written material to support subject matter. There is an overview of "What is Oxygen" and "Why do we need it".  Instructor also reviews how to create a safe environment for oxygen use, the importance of using oxygen as prescribed, and the risks of noncompliance. There is a brief discussion on traveling with oxygen and resources the patient may utilize. Flowsheet Row PULMONARY REHAB OTHER RESPIRATORY from 10/14/2023 in [facility] for Heart, Vascular, & Lung Health  Date 10/14/23  Educator RN  Instruction Review Code 1- Verbalizes Understanding       Oxygen Use Group instruction provided by PowerPoint, verbal discussion, and written material to discuss how supplemental oxygen is prescribed and different types of oxygen supply systems. Resources for more information are provided.    Breathing Techniques Group instruction that is supported by demonstration and informational handouts. Instructor discusses the benefits of pursed lip and diaphragmatic breathing and detailed demonstration on how to perform both.     Risk Factor Reduction Group instruction that is supported by a PowerPoint presentation. Instructor discusses the definition of a risk factor, different risk factors for pulmonary disease, and how the heart and lungs work together.   Pulmonary Diseases Group instruction provided by PowerPoint, verbal discussion, and written material to support subject matter. Instructor gives an overview of  the different type of pulmonary diseases. There is also a discussion on risk factors and symptoms as well as ways to manage the diseases.   Stress and Energy Conservation Group instruction provided by PowerPoint, verbal discussion, and written material to support subject matter. Instructor gives an overview of stress and the impact it can have on the body. Instructor also reviews ways to reduce stress. There is also a discussion on energy conservation and ways to conserve energy throughout the day.   Warning Signs and Symptoms Group instruction provided by PowerPoint, verbal discussion, and written material to support subject matter. Instructor reviews warning signs and symptoms of stroke, heart attack, cold and flu. Instructor also reviews ways to prevent the spread of infection.   Other Education Group or individual verbal, written, or video instructions that support the educational goals of the pulmonary rehab program.    Knowledge Questionnaire Score:  Knowledge Questionnaire Score - 10/01/23 1022       Knowledge Questionnaire Score   Pre Score 18/18             Core Components/Risk Factors/Patient Goals at Admission:  Personal Goals and Risk Factors at Admission - 10/01/23 0932       Core Components/Risk Factors/Patient Goals on Admission   Improve shortness of breath with ADL's Yes    Intervention Provide education, individualized exercise plan and daily activity instruction to help decrease symptoms of SOB with activities of daily living.    Expected Outcomes Short Term: Improve cardiorespiratory fitness to achieve a reduction of symptoms when performing ADLs;Long Term: Be able to perform more ADLs without symptoms or delay the onset of symptoms             Core Components/Risk Factors/Patient  Goals Review:   Goals and Risk Factor Review     Row Name 10/13/23 1119             Core Components/Risk Factors/Patient Goals Review   Personal Goals Review Improve  shortness of breath with ADL's;Develop more efficient breathing techniques such as purse lipped breathing and diaphragmatic breathing and practicing self-pacing with activity.;Increase knowledge of respiratory medications and ability to use respiratory devices properly.       Review Monthly review of patient's Core Components/Risk Factors/Patient Goals are as follows: Goal progressing for improving shortness of breath with ADL's. Kenneth [name] is currently using 6L O2 to maintain sats >88% while exercising. He is currently exercising on the treadmill and the bike. Goal progressing for developing more efficient breathing techniques such as purse lipped breathing and diaphragmatic breathing; and practicing self-pacing with activity. Goal progressing for increasing knowledge of respiratory medications and the ability to use respiratroy devices properly. We will continue to monitor Kenneth [name] progress throughout the program.       Expected Outcomes To improve shortness of breath with ADL's, develop more efficient breathing techniques such as purse lipped breathing and diaphragmatic breathing; and practicing self-pacing with activity and  increase knowledge of respiratory medications and the ability to use respiratroy devices properly.                Core Components/Risk Factors/Patient Goals at Discharge (Final Review):   Goals and Risk Factor Review - 10/13/23 1119       Core Components/Risk Factors/Patient Goals Review   Personal Goals Review Improve shortness of breath with ADL's;Develop more efficient breathing techniques such as purse lipped breathing and diaphragmatic breathing and practicing self-pacing with activity.;Increase knowledge of respiratory medications and ability to use respiratory devices properly.    Review Monthly review of patient's Core Components/Risk Factors/Patient Goals are as follows: Goal progressing for improving shortness of breath with ADL's. Kenneth [name] is currently using 6L O2 to maintain  sats >88% while exercising. He is currently exercising on the treadmill and the bike. Goal progressing for developing more efficient breathing techniques such as purse lipped breathing and diaphragmatic breathing; and practicing self-pacing with activity. Goal progressing for increasing knowledge of respiratory medications and the ability to use respiratroy devices properly. We will continue to monitor Kenneth [name] progress throughout the program.    Expected Outcomes To improve shortness of breath with ADL's, develop more efficient breathing techniques such as purse lipped breathing and diaphragmatic breathing; and practicing self-pacing with activity and  increase knowledge of respiratory medications and the ability to use respiratroy devices properly.             ITP Comments: Pt is making expected progress toward Pulmonary Rehab goals after completing 5 session(s). Recommend continued exercise, life style modification, education, and utilization of breathing techniques to increase stamina and strength, while also decreasing shortness of breath with exertion.  Dr. Genetta Kenning is Medical Director for Pulmonary Rehab at [facility].

## 2023-10-21 ENCOUNTER — Encounter (HOSPITAL_COMMUNITY)
Admission: RE | Admit: 2023-10-21 | Discharge: 2023-10-21 | Disposition: A | Discharge status: Home / Self Care | Source: Ambulatory Visit | Attending: Pulmonary Disease | Admitting: Pulmonary Disease

## 2023-10-21 DIAGNOSIS — J84112 Idiopathic pulmonary fibrosis: Secondary | ICD-10-CM

## 2023-10-21 NOTE — Progress Notes (Signed)
 Daily Session Note  Patient Details  Name: IBRAHIMA HOLBERG MRN: [id number] Date of Birth: [birth year] Referring Provider:   [name] Pulmonary Rehab Walk Test from 10/01/2023 in [facility] for Heart, Vascular, & Lung Health  Referring Provider [name]  [[name]]       Encounter Date: 10/21/2023  Check In:  Session Check In - 10/21/23 1023       Check-In   Supervising physician immediately available to respond to emergencies [facility] MD immediately available    Physician(s) Charles Connor, NP    Location [facility]    Staff Present Willard Harman, RN, Shasta Deist, MS, ACSM-CEP, Exercise Physiologist;Nickalaus Crooke Rochelle Chu, ACSM-CEP, Exercise Physiologist;Casey Burnetta Cart, MS, Exercise Physiologist;Johnny Alexia Angelucci, MS, Exercise Physiologist    Virtual Visit No    Medication changes reported     No    Fall or balance concerns reported    No    Tobacco Cessation No Change    Warm-up and Cool-down Performed as group-led instruction    Resistance Training Performed Yes    VAD Patient? No    PAD/SET Patient? No      Pain Assessment   Currently in Pain? No/denies    Multiple Pain Sites No             Capillary Blood Glucose: No results found for this or any previous visit (from the past 24 hours).    Social History   Tobacco Use  Smoking Status Never  Smokeless Tobacco Never    Goals Met:  Independence with exercise equipment Exercise tolerated well No report of concerns or symptoms today Strength training completed today  Goals Unmet:  Not Applicable  Comments: Service time is from 1008 to 1135.    Dr. Genetta Kenning is Medical Director for Pulmonary Rehab at [facility].

## 2023-10-22 ENCOUNTER — Other Ambulatory Visit (HOSPITAL_COMMUNITY): Payer: Self-pay

## 2023-10-25 DIAGNOSIS — J9601 Acute respiratory failure with hypoxia: Secondary | ICD-10-CM | POA: Diagnosis not present

## 2023-10-25 DIAGNOSIS — U071 COVID-19: Secondary | ICD-10-CM | POA: Diagnosis not present

## 2023-10-26 ENCOUNTER — Encounter (HOSPITAL_COMMUNITY)
Admission: RE | Admit: 2023-10-26 | Discharge: 2023-10-26 | Disposition: A | Discharge status: Home / Self Care | Source: Ambulatory Visit | Attending: Pulmonary Disease | Admitting: Pulmonary Disease

## 2023-10-26 DIAGNOSIS — J84112 Idiopathic pulmonary fibrosis: Secondary | ICD-10-CM

## 2023-10-26 NOTE — Progress Notes (Signed)
 Daily Session Note  Patient Details  Name: SEDDRICK FLAX MRN: [id number] Date of Birth: 01-Aug-1940 Referring Provider:   [name] Pulmonary Rehab Walk Test from 10/01/2023 in [facility] for Heart, Vascular, & Lung Health  Referring Provider [name]  [[name]]       Encounter Date: 10/26/2023  Check In:  Session Check In - 10/26/23 1022       Check-In   Supervising physician immediately available to respond to emergencies [facility] MD immediately available    Physician(s) Marlana Silvan, NP    Location [facility]    Staff Present Atlas Lea, MS, ACSM-CEP, Exercise Physiologist;Randi Rochelle Chu, ACSM-CEP, Exercise Physiologist;Casey Felipe Horton, RT    Virtual Visit No    Medication changes reported     No    Fall or balance concerns reported    No    Tobacco Cessation No Change    Warm-up and Cool-down Performed as group-led instruction    Resistance Training Performed Yes    VAD Patient? No    PAD/SET Patient? No      Pain Assessment   Currently in Pain? No/denies    Multiple Pain Sites No             Capillary Blood Glucose: No results found for this or any previous visit (from the past 24 hours).    Social History   Tobacco Use  Smoking Status Never  Smokeless Tobacco Never    Goals Met:  Proper associated with RPD/PD & O2 Sat Exercise tolerated well No report of concerns or symptoms today Strength training completed today  Goals Unmet:  Not Applicable  Comments: Service time is from 1015 to 1132.    Dr. Genetta Kenning is Medical Director for Pulmonary Rehab at [facility].

## 2023-10-28 ENCOUNTER — Encounter (HOSPITAL_COMMUNITY)
Admission: RE | Admit: 2023-10-28 | Discharge: 2023-10-28 | Disposition: A | Discharge status: Home / Self Care | Source: Ambulatory Visit | Attending: Pulmonary Disease | Admitting: Pulmonary Disease

## 2023-10-28 DIAGNOSIS — J84112 Idiopathic pulmonary fibrosis: Secondary | ICD-10-CM

## 2023-10-28 NOTE — Progress Notes (Signed)
 Daily Session Note  Patient Details  Name: TRICE ASPINALL MRN: [id number] Date of Birth: Feb 02, 1941 Referring Provider:   [name] Pulmonary Rehab Walk Test from 10/01/2023 in [facility] for Heart, Vascular, & Lung Health  Referring Provider [name]  [[name]]       Encounter Date: 10/28/2023  Check In:  Session Check In - 10/28/23 1024       Check-In   Supervising physician immediately available to respond to emergencies [facility] MD immediately available    Physician(s) Lawana Pray, NP    [facility]    Staff Present Atlas Lea, MS, ACSM-CEP, Exercise Physiologist;Randi Rochelle Chu, ACSM-CEP, Exercise Physiologist;Casey Carmen Chol, RN, BSN    Virtual Visit No    Medication changes reported     No    Fall or balance concerns reported    No    Tobacco Cessation No Change    Warm-up and Cool-down Performed as group-led instruction    Resistance Training Performed Yes    VAD Patient? No    PAD/SET Patient? No      Pain Assessment   Currently in Pain? No/denies    Multiple Pain Sites No             Capillary Blood Glucose: No results found for this or any previous visit (from the past 24 hours).    Social History   Tobacco Use  Smoking Status Never  Smokeless Tobacco Never    Goals Met:  Independence with exercise equipment Exercise tolerated well No report of concerns or symptoms today Strength training completed today  Goals Unmet:  Not Applicable  Comments: Service time is from 1013 to 1147    Dr. Genetta Kenning is Medical Director for Pulmonary Rehab at [facility].

## 2023-11-02 ENCOUNTER — Encounter (HOSPITAL_COMMUNITY)
Admission: RE | Admit: 2023-11-02 | Discharge: 2023-11-02 | Disposition: A | Discharge status: Home / Self Care | Source: Ambulatory Visit | Attending: Pulmonary Disease | Admitting: Pulmonary Disease

## 2023-11-02 ENCOUNTER — Other Ambulatory Visit (HOSPITAL_COMMUNITY): Payer: Self-pay

## 2023-11-02 VITALS — Wt 168.2 lb

## 2023-11-02 DIAGNOSIS — J84112 Idiopathic pulmonary fibrosis: Secondary | ICD-10-CM | POA: Diagnosis not present

## 2023-11-02 NOTE — Progress Notes (Signed)
 Home Exercise Prescription I have reviewed a Home Exercise Prescription with Kenneth [name]. Sylvestre is currently exercising at home but not consistently. I encouraged him to exercise 1 non-rehab day/wk for 30 min/day. He agreed with my recommendations. Zachry can walk outside or use his seated bike. Mykelti seems very motivated to exercise outside of rehab. The patient stated that their goals were to decrease the need for supplemental oxygen . We reviewed exercise guidelines, target heart rate during exercise, RPE Scale, weather conditions, endpoints for exercise, warmup and cool down. The patient is encouraged to come to me with any questions. I will continue to follow up with the patient to assist them with progression and safety. Spent 15 min with patient discussing home exercise plan and goals  Floretta Huron, MS, ACSM-CEP 11/02/2023 11:56 AM

## 2023-11-02 NOTE — Progress Notes (Signed)
 Daily Session Note  Patient Details  Name: Kenneth [name] MRN: [id number] Date of Birth: 10-Dec-1940 Referring Provider:   [name] Pulmonary Rehab Walk Test from 10/01/2023 in [facility] for Heart, Vascular, & Lung Health  Referring Provider [name]  [[name]]       Encounter Date: 11/02/2023  Check In:  Session Check In - 11/02/23 1027       Check-In   Supervising physician immediately available to respond to emergencies [facility] MD immediately available    Physician(s) Lawana Pray, NP    Location [facility]    Staff Present Atlas Lea, MS, ACSM-CEP, Exercise Physiologist;Randi Rochelle Chu, ACSM-CEP, Exercise Physiologist;Casey Carmen Chol, RN, BSN;Johnny Porter, MS, Exercise Physiologist    Virtual Visit No    Medication changes reported     No    Fall or balance concerns reported    No    Tobacco Cessation No Change    Warm-up and Cool-down Performed as group-led instruction    Resistance Training Performed Yes    VAD Patient? No    PAD/SET Patient? No      Pain Assessment   Currently in Pain? No/denies             Capillary Blood Glucose: No results found for this or any previous visit (from the past 24 hours).   Exercise Prescription Changes - 11/02/23 1200       Response to Exercise   Blood Pressure (Admit) 112/68    Blood Pressure (Exercise) 120/60    Blood Pressure (Exit) 96/52    Heart Rate (Admit) 80 bpm    Heart Rate (Exercise) 112 bpm    Heart Rate (Exit) 79 bpm    Oxygen  Saturation (Admit) 96 %    Oxygen  Saturation (Exercise) 94 %    Oxygen  Saturation (Exit) 94 %    Rating of Perceived Exertion (Exercise) 13    Perceived Dyspnea (Exercise) 2    Duration Continue with 30 min of aerobic exercise without signs/symptoms of physical distress.    Intensity THRR unchanged      Progression   Progression Continue to progress workloads to maintain intensity without signs/symptoms of physical distress.       Resistance Training   Training Prescription Yes    Weight blue bands    Reps 10-15    Time 10 Minutes      Oxygen    Oxygen  Continuous    Liters 4-6      Treadmill   MPH 1.8    Grade 0    Minutes 15    METs 2.2      Bike   Level 2.5    Minutes 15    METs 3             Social History   Tobacco Use  Smoking Status Never  Smokeless Tobacco Never    Goals Met:  Independence with exercise equipment Exercise tolerated well No report of concerns or symptoms today Strength training completed today  Goals Unmet:  Not Applicable  Comments: Service time is from 1010 to 1134    Dr. Genetta Kenning is Medical Director for Pulmonary Rehab at [facility].

## 2023-11-03 ENCOUNTER — Encounter (HOSPITAL_COMMUNITY)
Admission: RE | Admit: 2023-11-03 | Discharge: 2023-11-03 | Disposition: A | Discharge status: Home / Self Care | Source: Ambulatory Visit | Attending: Internal Medicine

## 2023-11-03 ENCOUNTER — Other Ambulatory Visit: Payer: Self-pay

## 2023-11-03 ENCOUNTER — Encounter (HOSPITAL_BASED_OUTPATIENT_CLINIC_OR_DEPARTMENT_OTHER): Payer: Self-pay | Admitting: Emergency Medicine

## 2023-11-03 ENCOUNTER — Telehealth (HOSPITAL_COMMUNITY): Payer: Self-pay | Admitting: Cardiology

## 2023-11-03 ENCOUNTER — Emergency Department (HOSPITAL_BASED_OUTPATIENT_CLINIC_OR_DEPARTMENT_OTHER)
Admission: EM | Admit: 2023-11-03 | Discharge: 2023-11-03 | Disposition: A | Discharge status: Home / Self Care | Attending: Emergency Medicine | Admitting: Emergency Medicine

## 2023-11-03 ENCOUNTER — Emergency Department (HOSPITAL_BASED_OUTPATIENT_CLINIC_OR_DEPARTMENT_OTHER)

## 2023-11-03 DIAGNOSIS — J90 Pleural effusion, not elsewhere classified: Secondary | ICD-10-CM | POA: Diagnosis not present

## 2023-11-03 DIAGNOSIS — R079 Chest pain, unspecified: Secondary | ICD-10-CM | POA: Diagnosis not present

## 2023-11-03 DIAGNOSIS — I493 Ventricular premature depolarization: Secondary | ICD-10-CM | POA: Insufficient documentation

## 2023-11-03 DIAGNOSIS — I491 Atrial premature depolarization: Secondary | ICD-10-CM | POA: Insufficient documentation

## 2023-11-03 DIAGNOSIS — R0989 Other specified symptoms and signs involving the circulatory and respiratory systems: Secondary | ICD-10-CM | POA: Diagnosis not present

## 2023-11-03 DIAGNOSIS — R0602 Shortness of breath: Secondary | ICD-10-CM

## 2023-11-03 DIAGNOSIS — I498 Other specified cardiac arrhythmias: Secondary | ICD-10-CM | POA: Insufficient documentation

## 2023-11-03 DIAGNOSIS — R918 Other nonspecific abnormal finding of lung field: Secondary | ICD-10-CM | POA: Diagnosis not present

## 2023-11-03 DIAGNOSIS — Z8546 Personal history of malignant neoplasm of prostate: Secondary | ICD-10-CM | POA: Diagnosis not present

## 2023-11-03 DIAGNOSIS — Z7982 Long term (current) use of aspirin: Secondary | ICD-10-CM | POA: Diagnosis not present

## 2023-11-03 LAB — HEPATIC FUNCTION PANEL
ALT: 11 U/L (ref 0–44)
AST: 19 U/L (ref 15–41)
Albumin: 4 g/dL (ref 3.5–5.0)
Alkaline Phosphatase: 55 U/L (ref 38–126)
Bilirubin, Direct: 0.1 mg/dL (ref 0.0–0.2)
Indirect Bilirubin: 0.1 mg/dL — ABNORMAL LOW (ref 0.3–0.9)
Total Bilirubin: 0.2 mg/dL (ref 0.0–1.2)
Total Protein: 5.9 g/dL — ABNORMAL LOW (ref 6.5–8.1)

## 2023-11-03 LAB — BASIC METABOLIC PANEL WITH GFR
Anion gap: 13 (ref 5–15)
BUN: 15 mg/dL (ref 8–23)
CO2: 25 mmol/L (ref 22–32)
Calcium: 9 mg/dL (ref 8.9–10.3)
Chloride: 96 mmol/L — ABNORMAL LOW (ref 98–111)
Creatinine, Ser: 0.68 mg/dL (ref 0.61–1.24)
GFR, Estimated: >60 mL/min (ref >60)
Glucose, Bld: 105 mg/dL — ABNORMAL HIGH (ref 70–99)
Potassium: 4.4 mmol/L (ref 3.5–5.1)
Sodium: 133 mmol/L — ABNORMAL LOW (ref 135–145)

## 2023-11-03 LAB — CBC
HCT: 31.2 % — ABNORMAL LOW (ref 39.0–52.0)
Hemoglobin: 11 g/dL — ABNORMAL LOW (ref 13.0–17.0)
MCH: 31.1 pg (ref 26.0–34.0)
MCHC: 35.3 g/dL (ref 30.0–36.0)
MCV: 88.1 fL (ref 80.0–100.0)
Platelets: 190 10*3/uL (ref 150–400)
RBC: 3.54 MIL/uL — ABNORMAL LOW (ref 4.22–5.81)
RDW: 14 % (ref 11.5–15.5)
WBC: 6.8 10*3/uL (ref 4.0–10.5)
nRBC: 0 % (ref 0.0–0.2)

## 2023-11-03 LAB — RESP PANEL BY RT-PCR (RSV, FLU A&B, COVID)  RVPGX2
Influenza A by PCR: NEGATIVE
Influenza B by PCR: NEGATIVE
Resp Syncytial Virus by PCR: NEGATIVE
SARS Coronavirus 2 by RT PCR: NEGATIVE

## 2023-11-03 LAB — TROPONIN T, HIGH SENSITIVITY
Troponin T High Sensitivity: <15 ng/L (ref <19)
Troponin T High Sensitivity: <15 ng/L (ref <19)

## 2023-11-03 LAB — LIPASE, BLOOD: Lipase: 16 U/L (ref 11–51)

## 2023-11-03 LAB — MAGNESIUM: Magnesium: 2 mg/dL (ref 1.7–2.4)

## 2023-11-03 LAB — PRO BRAIN NATRIURETIC PEPTIDE: Pro Brain Natriuretic Peptide: 206 pg/mL (ref <300.0)

## 2023-11-03 LAB — TSH: TSH: 0.785 u[IU]/mL (ref 0.350–4.500)

## 2023-11-03 MED ORDER — DENOSUMAB 60 MG/ML ~~LOC~~ SOSY
60.0000 mg | PREFILLED_SYRINGE | Freq: Once | SUBCUTANEOUS | Status: AC
  Administered 2023-11-03: 60 mg via SUBCUTANEOUS

## 2023-11-03 MED ORDER — DENOSUMAB 60 MG/ML ~~LOC~~ SOSY
PREFILLED_SYRINGE | SUBCUTANEOUS | Status: AC
  Filled 2023-11-03: qty 1

## 2023-11-03 NOTE — ED Notes (Signed)
 RN reviewed discharge instructions with pt. Pt verbalized understanding and had no further questions. VSS upon discharge.

## 2023-11-03 NOTE — ED Provider Notes (Signed)
 [place] EMERGENCY DEPARTMENT AT [facility] Provider Note   CSN: [id number] Arrival date & time: 11/03/23  1521     History  Chief Complaint  Patient presents with   Shortness of Breath    Kenneth [name] is a 83 y.o. male with past medical history significant for pulmonary fibrosis, interstitial lung disease, hyperlipidemia, GERD, hiatal hernia, prostate cancer, B-cell lymphoma, who was recently hospitalized in February secondary to COVID-19 and has been on 2 L nasal cannula since then, as well as undergoing pulmonary rehab who presents with concern for new fatigue, chest pain, shortness of breath starting yesterday.  Patient reports that he has previously had some intermittent PVCs but feels like his heart rate is quite low around 40s to 50s which is new for him.  He does take metoprolol  but reports no previous bradycardia with his heart medications.  No change in his previous lung medications.  No new fever, chills.  He reports 1 isolated episode of vomiting, and some chronic nausea, but reports this is not significant change from his baseline as one of his lung medications tends to cause some nausea, vomiting and diarrhea.  No recent sick contacts.  No chest pain or chest tightness at rest or at time my evaluation.  Symptoms are worse with exertion.  Patient is a retired [employer].  Eloisa Hait Dr. Mitzie Anda for cardiology.   Shortness of Breath      Home Medications Prior to Admission medications   Medication Sig Start Date End Date Taking? Authorizing Provider  acetaminophen  (TYLENOL ) 500 MG tablet Take 1,000 mg by mouth every 6 (six) hours as needed for moderate pain.    [provider]  acetaminophen -codeine  (TYLENOL  #3) 300-30 MG tablet Take 1 tablet by mouth every 4 (four) hours as needed (for pain).    [provider]  albuterol  (VENTOLIN  HFA) 108 (90 Base) MCG/ACT inhaler Inhale 2 puffs into the lungs every 6 (six) hours as needed for wheezing or shortness  of breath. 08/26/23   Etter Hermann., MD  aspirin  81 MG chewable tablet Chew 81 mg by mouth 3 (three) times a week.    [provider]  carboxymethylcellulose (REFRESH TEARS) 0.5 % SOLN Place 1 drop into both eyes daily as needed (dry eyes).    [provider]  carisoprodol  (SOMA ) 250 MG tablet Take 175-350 mg by mouth 3 (three) times daily as needed (for muscle spasms).    [provider]  Cholecalciferol  (VITAMIN D -3) 1000 UNITS CAPS Take 2,000 Units by mouth every evening.    [provider]  CO-ENZYME Q-10 PO Take 1 tablet by mouth every evening.     [provider]  Cyanocobalamin  (VITAMIN B12) 1000 MCG TBCR Take 1,000 mcg by mouth daily.    [provider]  denosumab  (PROLIA ) 60 MG/ML SOSY injection Inject 60 mg into the skin every 6 (six) months. At [facility]    [provider]  desvenlafaxine (PRISTIQ) 50 MG 24 hr tablet Take 50 mg by mouth daily. 05/26/22   [provider]  diclofenac  (VOLTAREN ) 75 MG EC tablet Take 75 mg by mouth 2 (two) times daily as needed (pain.). 01/13/18   [provider]  diclofenac  sodium (VOLTAREN ) 1 % GEL Apply 4 g topically 4 (four) times daily as needed (pain.). 01/13/18   [provider]  dicyclomine  (BENTYL ) 10 MG capsule Take one by mouth every 6 hours as needed for abdominal cramping 01/24/15   Tobin Forts, MD  diphenoxylate-atropine (LOMOTIL) 2.5-0.025 MG tablet Take 1 tablet by mouth as needed for diarrhea or loose stools.    [provider]  Emollient (AQUAPHOR ADV PROTECT HEALING EX) Apply 1 Application topically daily as needed (itching).    [provider]  Evolocumab (REPATHA) 140 MG/ML SOSY Inject 140 mg into the skin every 30 (thirty) days.    [provider]  Glucosamine Sulfate 500 MG TABS Take 500 mg by mouth daily.    [provider]  HYDROcodone -acetaminophen  (NORCO) 10-325 MG tablet Take 1 tablet by  mouth See admin instructions. Take 1 tablet by mouth every 4-6 hours as needed for pain    [provider]  Investigational - Study Medication Inhale 12 puffs into the lungs See admin instructions. Dr. Maire Scot: SaTitle:A Randomized, Double-blind, Placebo-controlled, Phase 3 Study of the Efficacy and Safety of Inhaled Treprostinil in Subjects with Idiopathic Pulmonary Fibrosis Dose and Duration of Treatment:Treprostinil for Inhalation 0.6 mg/mL, or placebo (randomly assigned 1:1) over a 52 week period.  Protocol # RIN-PF-301; IND # [id number]; Clinical Trials.gov Identifier: [id number]  Inhale 12 puffs into the lungs 4 times a day 04/06/23 04/05/24  [provider]  latanoprost  (XALATAN ) 0.005 % ophthalmic solution Place 1 drop into both eyes at bedtime.    [provider]  loratadine  (CLARITIN ) 10 MG tablet Take 10 mg by mouth daily.    [provider]  meclizine  (ANTIVERT ) 25 MG tablet Take 25 mg by mouth 3 (three) times daily as needed for dizziness.    [provider]  metoprolol  succinate (TOPROL  XL) 25 MG 24 hr tablet Take 1 tablet (25 mg total) by mouth 2 (two) times daily. 09/22/22   Darlis Eisenmenger, MD  Multiple Vitamin (MULTIVITAMIN) tablet Take 1 tablet by mouth daily with breakfast.    [provider]  omeprazole  (PRILOSEC) 40 MG capsule TAKE 1 CAPSULE (40 MG TOTAL) BY MOUTH DAILY. 05/28/23   Tobin Forts, MD  ondansetron  (ZOFRAN ) 8 MG tablet Take 1 tablet (8 mg total) by mouth every 8 (eight) hours as needed for nausea or vomiting. 10/11/23   Sumner Ends, MD  Pirfenidone  (ESBRIET ) 801 MG TABS Take 1 tablet (801 mg total) by mouth 3 (three) times daily with meals. 03/25/23   Mannam, Praveen, MD  Probiotic Product (ALIGN) 4 MG CAPS Take 4 mg by mouth daily.    [provider]  SODIUM FLUORIDE 5000 PLUS 1.1 % CREA dental cream Place 1 Application onto teeth at bedtime. 05/05/23   [provider]  XHANCE  93 MCG/ACT  EXHU Place 1 spray into both nostrils 2 (two) times daily as needed (for sinus issues).    [provider]      Allergies    Patient has no known allergies.    Review of Systems   Review of Systems  Respiratory:  Positive for shortness of breath.   All other systems reviewed and are negative.   Physical Exam Updated Vital Signs BP (!) 106/56   Pulse 64   Temp 98.7 F (37.1 C)   Resp (!) 21   SpO2 99%  Physical Exam Vitals and nursing note reviewed.  Constitutional:      General: He is not in acute distress.    Appearance: Normal appearance.  HENT:     Head: Normocephalic and atraumatic.  Eyes:     General:        Right eye: No discharge.        Left eye: No  discharge.  Cardiovascular:     Rate and Rhythm: Bradycardia present. Rhythm irregular.     Heart sounds: No murmur heard.    No friction rub. No gallop.  Pulmonary:     Effort: Pulmonary effort is normal.     Breath sounds: Normal breath sounds.     Comments: No wheezing, rhonchi, stridor, rales, no increased respiratory effort on my exam, no focal consolidation noted. Abdominal:     General: Bowel sounds are normal.     Palpations: Abdomen is soft.  Musculoskeletal:     Comments: No peripheral edema noted bilaterally.  Skin:    General: Skin is warm and dry.     Capillary Refill: Capillary refill takes less than 2 seconds.  Neurological:     Mental Status: He is alert and oriented to person, place, and time.  Psychiatric:        Mood and Affect: Mood normal.        Behavior: Behavior normal.     ED Results / Procedures / Treatments   Labs (all labs ordered are listed, but only abnormal results are displayed) Labs Reviewed  BASIC METABOLIC PANEL WITH GFR - Abnormal; Notable for the following components:      Result Value   Sodium 133 (*)    Chloride 96 (*)    Glucose, Bld 105 (*)    All other components within normal limits  CBC - Abnormal; Notable for the following components:   RBC 3.54  (*)    Hemoglobin 11.0 (*)    HCT 31.2 (*)    All other components within normal limits  HEPATIC FUNCTION PANEL - Abnormal; Notable for the following components:   Total Protein 5.9 (*)    Indirect Bilirubin 0.1 (*)    All other components within normal limits  RESP PANEL BY RT-PCR (RSV, FLU A&B, COVID)  RVPGX2  TSH  MAGNESIUM  PRO BRAIN NATRIURETIC PEPTIDE  LIPASE, BLOOD  TROPONIN T, HIGH SENSITIVITY  TROPONIN T, HIGH SENSITIVITY    EKG None  Radiology DG Chest Port 1 View Result Date: 11/03/2023 CLINICAL DATA:  Shortness of breath. Fatigue. Chest pain. Symptoms starting yesterday. History of pulmonary fibrosis. EXAM: PORTABLE CHEST 1 VIEW COMPARISON:  09/16/2023 FINDINGS: Low lung volumes. Diffuse interstitial changes throughout the lungs may represent fibrosis or edema. Similar appearance to prior study. Diffuse cardiac enlargement. Small bilateral pleural effusions. No pneumothorax. Degenerative changes in the spine. Postoperative changes in the cervical spine. IMPRESSION: Cardiac enlargement with small pleural effusions. Diffuse interstitial changes with low lung volumes possibly representing fibrosis or edema. Similar appearance to previous study. Electronically Signed   By: Boyce Byes M.D.   On: 11/03/2023 16:15    Procedures Procedures    Medications Ordered in ED Medications - No data to display  ED Course/ Medical Decision Making/ A&P Clinical Course as of 11/03/23 1824  Wed Nov 03, 2023  1605 Sees dr. Mitzie Anda [CP]    Clinical Course User Index [CP] Nelly Banco, PA-C                                 Medical Decision Making Amount and/or Complexity of Data Reviewed Labs: ordered. Radiology: ordered.   This patient is a 83 y.o. male  who presents to the ED for concern of palpitations, shob.   Differential diagnoses prior to evaluation: The emergent differential diagnosis includes, but is not limited to,  unstable tachydysrhythmia, unstable  bradycardia, sick sinus syndrome, heart block or other abnormal electrophysiologic abnormality including WPW, LGL, brugada, SVT, afib, a flutter, vs other, asthma exacerbation, COPD exacerbation, acute upper respiratory infection, acute bronchitis, chronic bronchitis, interstitial lung disease, ARDS, PE, pneumonia, atypical ACS, carbon monoxide poisoning, spontaneous pneumothorax, new CHF vs CHF exacerbation, versus other . This is not an exhaustive differential.   Past Medical History / Co-morbidities / Social History:  pulmonary fibrosis, interstitial lung disease, hyperlipidemia, GERD, hiatal hernia, prostate cancer, B-cell lymphoma, who was recently hospitalized in February secondary to COVID-19 and has been on 2 L nasal cannula since then, as well as undergoing pulmonary rehab  Additional history: Chart reviewed. Pertinent results include: Extensively reviewed outpatient pulmonology, and rehab visits, as well as outpatient cardiology, previous echo.  Physical Exam: Physical exam performed. The pertinent findings include: Patient with bradycardic heart rate with beats in bigeminy, no acute respiratory distress but increased work of breathing with ambulation, 87% on room air but improved to 98% on nasal cannula, initially somewhat hypertensive, blood pressure 156/98, improved to 130/66 on recheck.  Initially briefly tachypneic, respirations 22, improved to 19 on recheck.  Lab Tests/Imaging studies: I personally interpreted labs/imaging and the pertinent results include: CBC notable for mild anemia, hemoglobin 11, his BNP is within the normal range, hepatic function panel with some protein deficit, otherwise overall unremarkable, normal magnesium, normal lipase.  I independently interpreted plain film chest x-ray which shows some worsening of his chronic interstitial lung disease as well as some small bilateral pleural effusions.  Overall somewhat similar in appearance to x-ray from February 16 but  appears somewhat worse than image from March 13.  Troponin normal range, metabolic panel shows mild hyponatremia I agree with the radiologist interpretation.  Repeat troponin flat, RVP negative  Cardiac monitoring: EKG obtained and interpreted by myself and attending physician which shows:  sinus tachycardia with functional pulse that is bradycardic, multiple supraventricular and ventricular PVCs, new bigeminy   Medications: After discussion with cardiologist we will modify his metoprolol  dosing, and have him follow-up closely as an outpatient.  Consults: I spoke with Dr. Addie Holstein with cardiology who given patient's close follow-up and workup today recommends an adjustment of his blood pressure medication to 25 mg Toprol  in the daytime, but increased to 50 mg at night, extensive return precautions for any syncope, near syncope, or significant worsening chest pain or dyspnea.  Okay to follow-up closely with cardiologist Dr. Harlie Lieu on Monday, and Dr. Addie Holstein will reach out to the electrophysiology team in the morning for any other recommendations.  Disposition: After consideration of the diagnostic results and the patients response to treatment, I feel that patient is stable for discharge with plan as above, extensive return precautions given.   emergency department workup does not suggest an emergent condition requiring admission or immediate intervention beyond what has been performed at this time. The plan is: as above. The patient is safe for discharge and has been instructed to return immediately for worsening symptoms, change in symptoms or any other concerns.  Final Clinical Impression(s) / ED Diagnoses Final diagnoses:  Shortness of breath  Bigeminy  PVC's (premature ventricular contractions)  PAC (premature atrial contraction)    Rx / DC Orders ED Discharge Orders     None         Stefan Edge 11/03/23 1824    Tegeler, Marine Sia, MD 11/03/23 [phone]

## 2023-11-03 NOTE — ED Triage Notes (Signed)
 Pt c/o fatigue, CP and shob starting yesterday. Hx of Pulmonary Fibrosis and PVCs. Pt uses L [place] o2

## 2023-11-03 NOTE — Discharge Instructions (Addendum)
 Dr. Addie Holstein with cardiology recommends toprol -xl dose change from 25 bid to 25 during day with 50mg  for nighttime dose. Okay to follow up on Monday with Dr. Mclean, and Dr. Addie Holstein plans to consult with electrophysiology team in the morning.  I am also forwarding your chart to Dr. Mitzie Anda who will assess the findings in the morning and make recommendations at that time.

## 2023-11-03 NOTE — ED Notes (Signed)
 Pt's sats on RA were 87-89%.  PT stated he wears O2 at 2lpm at home during the night and as needed during the day.  BS are clear.  Pt placed on 2l [place] at [place].  Sats increased to 95%.  RN at bedside.

## 2023-11-03 NOTE — Telephone Encounter (Signed)
 Patient left VM on triage line with increased concerns of PVC and increased fatigue   -with recent bout of COIVD would like to discuss   Returned call to patient and currently in [facility]  -pt request to schedule ER followup 5/5 @930 

## 2023-11-04 ENCOUNTER — Ambulatory Visit: Admitting: Internal Medicine

## 2023-11-04 ENCOUNTER — Ambulatory Visit: Attending: Cardiology | Admitting: Cardiology

## 2023-11-04 ENCOUNTER — Telehealth: Payer: Self-pay

## 2023-11-04 ENCOUNTER — Telehealth (HOSPITAL_COMMUNITY): Payer: Self-pay

## 2023-11-04 ENCOUNTER — Encounter (HOSPITAL_COMMUNITY): Admission: RE | Admit: 2023-11-04 | Source: Ambulatory Visit

## 2023-11-04 ENCOUNTER — Other Ambulatory Visit (HOSPITAL_COMMUNITY): Payer: Self-pay | Admitting: Cardiology

## 2023-11-04 ENCOUNTER — Ambulatory Visit (HOSPITAL_COMMUNITY)
Admission: RE | Admit: 2023-11-04 | Discharge: 2023-11-04 | Disposition: A | Discharge status: Home / Self Care | Source: Ambulatory Visit | Attending: Cardiology | Admitting: Cardiology

## 2023-11-04 ENCOUNTER — Encounter: Payer: Self-pay | Admitting: Internal Medicine

## 2023-11-04 ENCOUNTER — Encounter: Payer: Self-pay | Admitting: Cardiology

## 2023-11-04 VITALS — BP 136/68 | HR 89 | Ht 69.0 in | Wt 166.0 lb

## 2023-11-04 VITALS — BP 123/46 | HR 47 | Wt 163.5 lb

## 2023-11-04 DIAGNOSIS — R0609 Other forms of dyspnea: Secondary | ICD-10-CM | POA: Diagnosis not present

## 2023-11-04 DIAGNOSIS — J84112 Idiopathic pulmonary fibrosis: Secondary | ICD-10-CM | POA: Diagnosis not present

## 2023-11-04 DIAGNOSIS — I350 Nonrheumatic aortic (valve) stenosis: Secondary | ICD-10-CM | POA: Insufficient documentation

## 2023-11-04 DIAGNOSIS — I5022 Chronic systolic (congestive) heart failure: Secondary | ICD-10-CM | POA: Diagnosis not present

## 2023-11-04 DIAGNOSIS — R002 Palpitations: Secondary | ICD-10-CM | POA: Diagnosis not present

## 2023-11-04 DIAGNOSIS — R0602 Shortness of breath: Secondary | ICD-10-CM | POA: Diagnosis not present

## 2023-11-04 DIAGNOSIS — I700 Atherosclerosis of aorta: Secondary | ICD-10-CM | POA: Diagnosis not present

## 2023-11-04 DIAGNOSIS — R000 Tachycardia, unspecified: Secondary | ICD-10-CM | POA: Diagnosis not present

## 2023-11-04 DIAGNOSIS — R5381 Other malaise: Secondary | ICD-10-CM | POA: Insufficient documentation

## 2023-11-04 DIAGNOSIS — Z8616 Personal history of COVID-19: Secondary | ICD-10-CM | POA: Diagnosis not present

## 2023-11-04 DIAGNOSIS — G4733 Obstructive sleep apnea (adult) (pediatric): Secondary | ICD-10-CM | POA: Insufficient documentation

## 2023-11-04 DIAGNOSIS — I2510 Atherosclerotic heart disease of native coronary artery without angina pectoris: Secondary | ICD-10-CM | POA: Insufficient documentation

## 2023-11-04 DIAGNOSIS — Z9981 Dependence on supplemental oxygen: Secondary | ICD-10-CM | POA: Diagnosis not present

## 2023-11-04 DIAGNOSIS — I493 Ventricular premature depolarization: Secondary | ICD-10-CM

## 2023-11-04 MED ORDER — METOPROLOL SUCCINATE ER 25 MG PO TB24: ORAL_TABLET | ORAL | 11 refills | Status: DC

## 2023-11-04 NOTE — Progress Notes (Signed)
 Zio patch placed onto patient.  All instructions and information reviewed with patient, they verbalize understanding with no questions.

## 2023-11-04 NOTE — Progress Notes (Signed)
 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 He tells me that this summer 2018*noticing insidious onset of shortness of breath particularly with walking stairs in the house or [place]. This was not that in the previous years. The summer 2018 he was in [place] but did not notice much shortness of breath. Otherwise he is able to do his activities of daily living and played golf with a car. He is more bothered by his back pain and sciatica as a result of previous back surgery. Shortness of breath is extremely mild. He says he became more aware of it after the radiologic investigations documented below. He had a chest x-ray that suggested interstitial findings and therefore he underwent a high-resolution CT scan of the chest that is described below. I personally visualized this high resolution CT chest and to me it shows bilateral bibasal subpleural reticulation that also extends to the upper lobes without any zonal predominance. There is no obvious honeycombing but there is traction bronchiectasis. There no mediastinal adenopathy. Therefore he is been referred here. In terms of exposure history other than radiation exposure he has not been on any pulmonary toxic drugs or any mold exposure or asbestos exposure. Does have GERD and is on PPI and is well ocntrolled   Does have spring and perennial allergies - sneezes easy. Allergy  test with [facility] negative some years ago  [employer] chest physicians interstitial lung disease questionnaire: He says that he does not cough. He is only trouble with dyspnea with strenuous exercise and it started 4 months ago. Past medical history significant for acid reflux. He has never smoked any tobacco or street drugs. Does not have any family history of lung disease.  At home he does not have any humidifier sound birds heart the water damage or mold. 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 He has standard radiation exposure while at work. The standard radiation for a radiologist. He does not take any pulmonary toxic drugs. House is 82 years old and he worries about mold. He sneezes if air is forced   Pulmonary function test shows isolated reduction in diffusion capacity to 62%. Walking desaturation test underneath her feet 3 laps on room air: Resting pulse ox 96%. Final pulse ox 95%. Heart rate resting was 92/m and went up to 109 minute.   Results for TAVARION, STONEBACK (MRN [id number]) as of 03/23/2017 10:04  Ref. Range 03/04/2017 09:13  FVC-Pre Latest Units: L 3.59  FVC-%Pred-Pre Latest Units: % 86  Results for DEKENDRICK, SLIGH (MRN [id number]) as of 03/23/2017 10:04  Ref. Range 03/04/2017 09:13  DLCO cor Latest Units: ml/min/mmHg 20.06  DLCO cor % pred Latest Units: % 63    Lungs/Pleura: There is subpleural reticulation with scattered ground-glass and traction bronchiolectasis, without a definite zonal predominance. No definitive honeycombing. Findings appear mildly progressive when radiographs dating back to 12/28/2008 are reviewed. Probable 5 mm subpleural lymph node along the right major fissure. No pleural fluid. Airway is unremarkable.      IMPRESSION: 1. Pulmonary parenchymal findings of interstitial lung disease which may be due to fibrotic nonspecific interstitial pneumonitis or, given slight progression over time, usual interstitial pneumonitis. 2. Aortic atherosclerosis (ICD10-170.0). Coronary artery calcification. 3. Aortic aneurysm NOS (  ICD10-I71.9). 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 Recommend annual imaging followup by CTA or MRA. This recommendation follows 2010 ACCF/AHA/AATS/ACR/ASA/SCA/SCAI/SIR/STS/SVM Guidelines for the Diagnosis and Management of Patients with Thoracic  Aortic Disease. Circulation. 2010; 121: Z610-R604. 4. Prominence of the right and left main pulmonary arteries can be seen with pulmonary arterial hypertension.     Electronically Signed   By: Shearon Denis M.D.   On: 02/17/2017 13:41      OV 03/23/2017  Chief Complaint  Patient presents with   Follow-up    SOB on exertion. Other than that he has been doing about the same. Denies any cough or CP.   Here to discuss results. Wife Deadra Everts here. 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 In inteirm no new symptoms.  Autoimmune profile negative. They raise possibility of 2nd opinion.  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 Progression is based on CXR comparison since 2010; there is no CT and is only suspicion of progression OV 05/06/2017  Chief Complaint  Patient presents with   Follow-up    Follow up today after beginning Esbriet . Pt has no c/o SOB, cough, or CP. Has had some mild nausea from the Esbriet  but other than that, he has been doing good on it.   ollow-up idiopathic pulmonary fibrosis mild severity  He is here to follow-up He is on the third week of his bed at 3 pills 3 times a day max dose. So far is tolerating it fine other than mild nausea. He does take after meals. He plans to meet with [facility] coordinator for medication support. He did visit [place]  this past weekend and did have vomiting especially after consuming few etoh drinks at a [employer]. He does apply sunscreen [his daughter is a Armed forces operational officer in town]. He still complains about the fact that and is 82 year old home when the air conditioner comes on and they air blows out of the duct system he does sneeze 10 times and his feet does feel cold. She plans to put hepa filters that are high and and also get the duct cleaning. He says he did have allergy  evaluation and found few years ago and it was negative. Recently he has  increased his rpinrole and alsogot an injection for his back and he does feel better   OV 06/22/2017  Chief Complaint  Patient presents with   Follow-up    Pt still taking Esbriet  and has been doing good except has been having issues with nausea x3 days. PFT done today. Still becomes SOB with exertion. Denies any complaints of cough or CP.   IPF followup  Routine followup. Now on esbriet  3 pills tid since end oct 2018. Having new nausea - moderate, x 3 days. Might be chills. But no associated diarrhea or other side effects. Spacing esbriet  4h only. Also has complaitns about high co pay. Unable to find charity. Doing exrcise with trainer - feeels that is better and more vigorous than rehab. Gets tachy with eercise but says he does not desaturate. Had PFT - stable. Lot of questions about disease    Walking desaturation test on 06/22/2017 185 feet x 3 laps on ROOM AIR:  did NOT desaturate. Rest pulse ox was 100%, final pulse ox was 99%. HR response 84/min at rest to 97/min at peak exertion. Patient NYMIR KRUCHTEN  Did not Desaturate < 88% . Kenneth [name] did  not  Desaturated </= 3% points. Jenine Mix A Piechocki yes did get tachyardic   OV 09/20/2017  Chief Complaint  Patient presents with   Follow-up    PFT done today.  Pt states he has been doing good. Pt still taking Esbriet  and states he is doing good on it.   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  Coretta Dexter for IPF follow-up.  Last visit December 2018.  Since then he has been compliant fully with his Pirfenidone  (Esbriet ).  His nausea resolved with Ginger intake.  However he is having 3-5 pound weight loss and also fatigue towards the end of the day and also low appetite.  He is exercising vigorously 5 times a week and he is wondering if the fatigue could be because of the heavy exercise.  He is not interested in lung transplantation.  He is open to research participation in interventional trial.  Currently he is on the IPF registry program.  Overall he is  well.  He did go to [place] to visit his son and he did not wear sunscreen and he did pick up a stage I skin burn with erythema that was more than usual.  But this resolved.  I did remind him about his obligation to wear sunscreen at all times with Pirfenidone  (Esbriet ).  He is interested in interventional research trials.  His pulmonary function test to me on an average is stable even though there is decline in FVC that seems to be an increase in DLCO so that is variability.  His walking desaturation test is around the same. He is interested in rolling over to the larger capsule of the Pirfenidone  (Esbriet ) so we will have to take it only 3 times a day. ESberit is cposting him $500/month or more due to high AGI and this is frustrating for him   Walking desaturation test on 09/20/2017 185 feet x 3 laps on ROOM AIR:  did walk briskly all 3 laps with only mild dyspnea desaturate. Rest pulse ox was 98%, final pulse ox was 98%. HR response 96/min at rest to 107/min at peak exertion. Patient LOYS BLASS  Did not Desaturate < 88% . Kenneth [name] did not  Desaturated </= 3% points. Kristin Peyer Bonello yes did get tachyardic this appears similar to a few months ago   OV 12/07/2017  Chief Complaint  Patient presents with   Follow-up    Pt had pre Spiro and DLCO prior to OV today. Pt has increase of SOB with exertion more with walking up the hill.   Dr. Linder Revere presents for follow-up of his IPF to ILD clinic.  In terms of his IPF he feels stable.  In fact he tells me that if not for the incidental chest x-ray and a subsequent CT scan that picked up pulmonary fibrosis he even to this day will not know that he has IPF.  He only has a mild exertional dyspnea class I which she always believes in the past that it was because of aging.  At this point in time he is on Esbriet  1 big pill 3 times daily at full dose.  He is now applying sunscreen and has not had any further sunburns.  He is following an extremely intense exercise  fitness regimen to improve his cardiac conditioning.  He believes his exertional heart rate and resting heart rate are much improved than before.  He works with a Psychologist, educational at [employer].  His main issue is from Pirfenidone  (Esbriet ).  He had some  mild nausea that has now resolved with ginger capsules but he continues to have some amount of fatigue low appetite and some mild weight loss.  He says this is mild and all tolerable.  The fatigue happens after working a lot in the yard and he loves yard work.  He wants to know if he can continue to do yard work.  Recently he did have some sinus congestion and took some steroids and antibiotics for it and he feels better but he still has some sinus fullness.  He had spirometry and simple walking desaturation test and the profile is below and I believe these are stable.  In fact his exertional heart rate is improved compared to previous.  Today he also participated in the IPF registry program.  There is a noninterventional trial.  He is interested in future interventional trials.   OV 03/08/2018  Chief Complaint  Patient presents with   Follow-up    PFT performed today. Pt states he has been doing well except he has been having problems with nausea and dizziness, and loss of appetite.   Kenneth [name] - presents for follow-up. For his IPF. He is on esbruet now. He was tolerating his Pirfenidone  (Esbriet ) quite well other than minor side effects till last visit. However in thesummer of 2019e's had increased nauseaand also weight loss. He has lost 6 pounds since prior visit. Overall he is lost 11 pounds since September 2018. We started the medication esbeit  anti-fibrotic for him in October 2018. His pants are getting looser.he is also having significant fatigue. Nevertheless he does not want to switch to the of anti-fibrotic ofev because of prior history of irritable bowel syndrome. He says that his baseline irritable bowel syndrome is worse than any side effects  he is having with esbriet  especially the fatigue.e is open to a transplant evaluation and consideration of research protocols. His spirometry shows stability with FVC but his DLCO might be declining.Aaron Aas His simple walk test did not show anychange. He is not noticing any subjective changes in dyspnea on effort tolerance while working out although he does admit to increased dizziness particularly when playing golf. During this time recently has not monitored his pulse ox but in the past and never went below 92%.  Other issues: He had some myalgia despite change in his statin. He will address this with his primary care. 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 Primary care has ordered a follow-up CT scan.This can be combined with a high risk CT scan. I I have reached out to  the thoracic radiology     OV 05/10/2018  Subjective:  Patient ID: Kenneth [name], male , DOB: 16-Nov-1940 , age 67 y.o. , MRN: [id number] , ADDRESS: [address] [place] [address]   05/10/2018 -   Chief Complaint  Patient presents with   Follow-up    review spiro with dlco.  c/o stable doe.  On 801mg  Esbriet  TID, notes occ dizziness, loss of appetite.      HPI DERRIC DANDURAND 83 y.o. -returns-year follow-up.  Diagnosis made towards the end of 2018.  He has been on Esbriet  since then.  His main issues have been weight loss.  He lost 10 pounds of weight at the time of last visit.  At the last visit he weighed 174 pounds in September 2019.  Back in August 2018 he weighed 186 pounds.  All this weight loss happen after he started Pirfenidone  (Esbriet ).  However he is now stabilized and he continues to be 874 pounds.  He is able to do activities of daily living and exercise but when he does stairs he gets dyspneic.  Overall he feels stable in the last 1 year in terms of his effort tolerance.  His main thing is that he has no appetite.  He has been communicating with the Pirfenidone  (Esbriet ) supporting about how to  manage his low appetite and occasional nausea.  We discussed the options about taking brief holidays or reduction in dose.  He is willing to try this.  He does take occasional ginger.  Of note he had a transplant referral set up by myself or [facility] but as our injection letter because he was out of network.  He tells me that his insurance company only advised that he would have a higher co-pay so is a little bit puzzled by this.  Since then he said conversation with his primary care physician and he wants to see Dr. Tura Gaines who is a son-in-law to his friend Dr. Karenann Other retired Careers adviser.  I know Dr. Twila Gale quite well from an IPF standpoint.  And therefore wholeheartedly supported the idea.  In terms of his lung function testing his FVC shows a decline although he is feeling the same.  On miniature walking test he seems a little more tachycardic but he says he feels the same.  He manages this heart rate with his exercise.  His DLCO with some variability appears stable.  He had a CT angiogram for thoracic aortic aneurysm in September 2019 and this when compared to one year earlier has been reported as stable but the different resolutions.  I offered to do a high-resolution CT chest here but he wants to hold off until he saw Dr. Twila Gale.  OV 08/23/2018  Subjective:  Patient ID: Kenneth [name], male , DOB: 01/16/1941 , age 16 y.o. , MRN: [id number] , ADDRESS: [address] [place] [address]   08/23/2018 -   Chief Complaint  Patient presents with   Follow-up    PFT performed today. Pt states he has been doing well since last visit and denies any complaints.   IPF followup"   Diagnosis made towards the end of 2018.  He has been on Esbriet  since then.   HPI FARHAN LEHN 83 y.o. 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  He continues to feel good.  His main issue is tolerating the Esbriet  which gives some fatigue.  Sometimes he has to postpone it.  In terms of his dyspnea he  continues to exercise.  His dyspnea symptom score is really minimal.  His cough is nonexistent.  He was losing weight with Esbriet  but now he is gained his weight back.  In the interim he did see Dr. Tura Gaines June 23, 2018 at [facility].  He had a satisfactory visit.  He thought he was stable.  His next visit with Dr. Twila Gale will be in June 2020.  Moving forward he wants to alternate every 6 months between myself and Dr. Twila Gale at [facility] which will give him 3 months with 1 pulmonologist or the other.  He is beginning to get interested in research studies.  We did pulmonary function test on him today and is documented below.  The FVC appears a fluctuant but the DLCO definitely appears on a downward trend.  The new DLCO is on a global lung initiated [GL I] equation and therefore the  percent predicted is higher.  But the absolute value showed downward trend line.  We went over this.  He definitely does not feel this subjectively.  His last high-resolution CT scan of the chest was in August of 2018.  He is willing to have one at follow-up.  He wants to see me again in 6 months.  His simple walking desaturation test appears stable.  He is thinking of making a trip to [place]  and wants to know if he should wear a mask.    OV 04/21/2019  Subjective:  Patient ID: Kenneth [name], male , DOB: 12-08-1940 , age 40 y.o. , MRN: [id number] , ADDRESS: [address] [place] [address]   04/21/2019 -   Chief Complaint  Patient presents with   Follow-up    Patient reports that his breathing is doing well at this time.    IPF followup. On esbriet   HPI DRIN KISHIMOTO 83 y.o. -presents for follow-up of his idiopathic pulmonary fibrosis.  Last visit was pre-pandemic in February 2018.  In the interim he did see Dr. Tura Gaines at [facility] and deemed to be stable.  At this point in time he is reporting continued stability with the symptoms as seen by the score below.  However I did notice  that he is lost lot of weight.  His BMI with his clothes on is 25 with a weight of 170 pounds.  He says his dry weight is actually around 162 pounds.  He feels that this is the stable weight in the last 6 months.  He says he is not bothered by it.  He says this is because of low appetite because of the and pirfenidone .  He feels the pirfenidone  is benefiting him and he continues to be is to be stable.  In fact his pulmonary function test shows " improvement".  He said that he felt the test today was somewhat variable and is effort based on the technician.  He had a recent high-resolution CT chest that shows continued stability.  I personally visualized this film.  At this point in time he is happy taking the pirfenidone .  But he did agree if there is further weight loss into the 150s pounds then we could reassess  Had a lot of discussion about COVID-19 and about appropriate risk reduction measures which he is continuing to do.  He did some read some literature about COVID-19 and had questions about it. He in Vit D supplementation and is agreeable to get his levels checked following literature about protective effect of Vit D  Other than that he reports 2 episodes of dizziness and in one of those he nearly passed out.  He was wondering if some of the symptoms were related to ropinirole  and therefore he has stopped it.  I reviewed the literature with him and the significant amount of dizziness and hypotension.  And also GI symptoms which can probably accentuated with pirfenidone .   OV 10/16/2019  Subjective:  Patient ID: Kenneth [name], male , DOB: Apr 12, 1941 , age 11 y.o. , MRN: [id number] , ADDRESS: [address] [place] [address]   10/16/2019 -   Chief Complaint  Patient presents with   Follow-up    PFT performed 3/31.  Pt states he is doing okay. States his breathing is about the same since last visit.     HPI SHIV LAUER 83 y.o.  -follow-up IPF on pirfenidone     -ROS   69-month  follow-up for Dr. Linder Revere.  He continues on pirfenidone .  He is taking full dose.  In the interim he has had hernia surgery 6 weeks ago for right inguinal hernia.  This is gone well.  He is change his exercise from bicycle to walking because of the hernia.  His symptom score is the same.  In January 2021 he saw Dr. Twila Gale at [facility] for IPF follow-up.  Deemed to be stable.  I reviewed that note.  After that he has had pulmonary function test with us  couple weeks ago.  This shows a decline.  He says the quality of instructions was poor and therefore the decline is because of that.  At least that is his suspicion.  He feels well.  Walking desaturation test is stable.  He has gained some weight.  He continues to have some nausea and fatigue and diarrhea with the pirfenidone .  But this is stable and manageable.  He feels his irritable bowel syndrome is active because of the pirfenidone .  He plans to see Dr. Levada Raymond for that.  His last liver function test was with Dr. Twila Gale in January 2021.  This was normal per the note.  His last CT scan was towards the end of 2020 and deemed to be stable.     OV 04/11/2020   Subjective:  Patient ID: Kenneth [name], male , DOB: 1940/08/27, age 54 y.o. years. , MRN: [id number],  ADDRESS: [address] [place] [address] PCP  Aldo Hun, MD Providers : Dr Letitia Ravens  Treatment Team:  Attending Provider: Maire Scot, MD   Chief Complaint  Patient presents with   Follow-up    PFT performed today.  Pt states he has been doing well since last visit and denies any complaints.    Follow-up idiopathic pulmonary fibrosis.  Diagnosis towards the end of 2018.  On pirfenidone .  Last CT scan of the chest September 2020.   HPI RAUL HOWALD 83 y.o. -presents for follow-up last seen in April 2021.  After that in July 2021 he visited with Dr. Tura Gaines at [facility].  I reviewed the notes.  The feeling was that he was stable.  He is  only minimally symptomatic.  He had a 6-minute walk test and did really well without any significant desaturations.  He is now resorted to high intensity interval training based on advice from a physical therapist/gym instructor.  He feels like stronger and failure.  He also states that his appetite is better and is eating better.  He feels his weight is stable.  In terms of his IPF symptoms is minimal and documented below.  He just has mild irritable bowel syndrome with his Esbriet  and baseline health.  His walking desaturation test today in the office is stable.  His pulmonary function test was reviewed.  Over the last 3 years it shows a slight steady decline.  This decline is both in FVC and DLCO.  He is averaging around 2.6% decline per year with FVC while 8% decline in the DLCO per year.  On an average with a 3% decline per year in the last 3 years.  We did discuss this.  We discussed #pulmonary fibrosis foundation support group -he will visit the website.  He gets newsletters from them.  At this point in time he is not interested in joining the support group but he did get the contact information for this and will reach out if he desires so.  #-We  also discussed research as a care option -we discussed trial sponsored by [place] [employer] psychotherapist of pirfenidone .  The trial is called [employer].  The investigation medical product is called PRM-151 in the axilla macrophage pathway.  Promising phase 2 data.  Research team gave him the consent form under my delegation.  He understands research is voluntary and is to evaluate unproven therapies.  He understands that the primary intent is to contribute to her scientific development and therapeutic gain is secondary because of an approved product the risks benefit ratio is very different compared to blood products.  He is going to read the consent and think about potential participation.  I sent him via email medical journal articles about the investigational  product    He tells me that he feels his weight is stable.  His dry weight early in the morning is between 162-163 pounds.  I pointed out that his weight today is 168 pounds and is lower than before.  However he says that the difference rating 168-172 is related to clothing and his weight at home is stable.  He is not concerned about the slight weight loss.  Overall while he agrees he is lost weight since starting Esbriet  he feels recently has been stable.  He wants to continue to monitor the situation  Irritable bowel: He said he will check with Dr. Elvin Hammer about this  Other issues:  - He wants me to check his iron panel -He will do follow-up of his IPF-registry visit today  -for research-  -He has had his Covid booster    OV 10/22/2020  Subjective:  Patient ID: Kenneth [name], male , DOB: 1940-08-07 , age 79 y.o. , MRN: [id number] , ADDRESS: [address] [place] [address] PCP Aldo Hun, MD Patient Care Team: Aldo Hun, MD as PCP - General (Internal Medicine) Judyth Nunnery, RN as Oncology Nurse Navigator  This Provider for this visit: Treatment Team:  Attending Provider: Maire Scot, MD    10/22/2020 -   Chief Complaint  Patient presents with   Follow-up    Fatigue from radiation treatments, more winded and more GI upset from radiation treatments    Follow-up idiopathic pulmonary fibrosis.  Diagnosis towards the end of 2018.  On pirfenidone .  Last CT scan of the chest September 2020.    HPI ANTWUN JABLONOWSKI 83 y.o. -returns for follow-up.  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  He is finished hormonal treatment and now is on radiation treatment.  Radiation treatment is aggravating his GI side effects from Pirfenidone  (Esbriet ) and from irritable bowel syndrome.  Is also causing fatigue.  He went August a masters in walk the tournament.  This made him more fatigued than what he experienced few years ago.  He said it took him a few days to recover.  He  used to have weight loss but this is now stabilized it is documented below.  His symptom severity score and his walking desaturation test are stable but his pulmonary function test continues to show decline.  His DLCO shows a relatively more decline but he said there were technique issues today.  He is got upcoming appointment with ILD clinic at [facility] in July.  At this point in time he is interested in phase 3 clinical trials.  We have quite a few to off as of summer 2022 but his prostate cancer could be an exclusion.  We will have the research team evaluate.  He is saddened  by the loss of his friend Dr. Marlow Sin.  He is evaluating whether he should join the pulmonary fibrosis foundation at the national level to be a board member.     OV 04/03/2021  Subjective:  Patient ID: Kenneth [name], male , DOB: [birth year] , age 29 y.o. , MRN: [id number] , ADDRESS: [address] [place] [address] PCP Aldo Hun, MD Patient Care Team: Aldo Hun, MD as PCP - General (Internal Medicine) Judyth Nunnery, RN as Oncology Nurse Navigator  This Provider for this visit: Treatment Team:  Attending Provider: Maire Scot, MD    04/03/2021 -   Chief Complaint  Patient presents with   Follow-up    Just performed a partial PFT, patient states increased SOB.    Follow-up idiopathic pulmonary fibrosis.  Diagnosis towards the end of 2018.  On pirfenidone .  Last CT scan of the chest September 2020. - > sept 2022  Underlying irritable bowel syndrome  Initial weight loss with pirfenidone  and then stabilized  HPI Kenneth [name] 83 y.o. 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  Since I last saw him he has had several health issues.  He got diagnosed with prostate cancer and then had radiation to that.  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  He is going to have a colonoscopy.  He feels his irritable bowel syndrome pirfenidone  and ultimately  the radiation made his GI symptoms significantly worse.  In order to combat the fatigue he has been undergoing physical therapy.  He also try to get back to golf and took a golf swing for practice and then states that he had a compression fracture in the spine.  This is probably also because of years of steroid injections in his back.  In addition when he had a high-resolution CT chest in September 2022 this month there was some neck nodes and he has had a neck CT scan followed by ENT visit to Dr. Tellis Feathers.  Apparently fine-needle aspiration was nondiagnostic.  He is scheduled to undergo excision biopsy.  The combination of all this is gotten a little bit more anxious and depressed.  As you can see his symptom scores for depression and anxiety are up.  He is also feeling a little more short of breath than usual.  He says his pulse ox is in the low 90s when he exerts himself but it still adequate.  Review of his symptoms and objective basis shows his symptom score to be the same.  His walking desaturation test is also the same.  His weight is stable.  His pulmonary function test shows stability in diffusion but a decline in FVC compared to earlier this year.  Had a high-resolution CT chest that only shows minimal progression fibrosis over 2 years.  Echocardiogram September 2022 did not show any evidence of pulmonary hypertension     CLINICAL DATA:  Pulmonary fibrosis.   EXAM: CT CHEST WITHOUT CONTRAST   TECHNIQUE: Multidetector CT imaging of the chest was performed following the standard protocol without intravenous contrast. High resolution imaging of the lungs, as well as inspiratory and expiratory imaging, was performed.   COMPARISON:  03/28/2019, 03/30/2018 and 02/27/2017. Bone scan 05/21/2020.   FINDINGS: Cardiovascular: Atherosclerotic calcification of the aorta, aortic valve and coronary arteries. Enlarged pulmonic trunk and right and left main pulmonary arteries. Heart is at the upper  limits of normal in size. No pericardial effusion.   Mediastinum/Nodes: Low left internal  jugular lymph nodes measure up to 8 mm (2/8), previously 3 mm on 03/28/2019. Mediastinal lymph nodes are subcentimeter in short axis size, as before. Hilar regions are difficult to definitively evaluate without IV contrast. Left axillary lymph nodes measure up to 9 mm, are unchanged and not considered enlarged by CT size criteria. High left subpectoral lymph node measures 7 mm (2/11), increased from 4 mm on 03/28/2019. No right axillary adenopathy. Esophagus is grossly unremarkable. Periesophageal lymph nodes are subcentimeter in short axis size and appears similar.   Lungs/Pleura: Peripheral and basilar predominant subpleural reticulation, ground-glass and traction bronchiectasis/bronchiolectasis, minimally progressive from 03/28/2019. Evidence of progression is best seen in the lower lung zones. No pleural fluid. Airway is unremarkable. No air trapping.   Upper Abdomen: Visualized portions of the liver, gallbladder, adrenal glands, kidneys, spleen, pancreas, stomach and bowel are unremarkable.   Musculoskeletal: Degenerative changes in the spine. There is a new small rounded sclerotic lesion in the lateral aspect of the right third rib (sagittal image 32). New lucent lesion in the left posterolateral aspect of the T8 vertebral body measuring approximately 1.3 x 1.7 x 2.1 cm (2/84 and sagittal image 96). This likely corresponds to abnormal uptake seen on bone scan 05/21/2020. Finding is new from 03/28/2019. Slight compression of the T12 superior endplate is new.   IMPRESSION: 1. Slight interval progression of pulmonary fibrosis. Findings are consistent with UIP per consensus guidelines: Diagnosis of Idiopathic Pulmonary Fibrosis: An Official ATS/ERS/JRS/ALAT Clinical Practice Guideline. Am Annie Barton Crit Care Med Vol 198, Iss 5, [phone], Mar 06 2017. 2. New small sclerotic lesion in the  lateral aspect of the right third rib, worrisome for metastatic prostate cancer. 3. Lucent lesion in the left posterolateral aspect of the T8 vertebral body corresponds to abnormal uptake on bone scan 05/21/2020, and is new from CT chest 03/28/2019, raising suspicion for malignancy. 4. Left internal and high left subpectoral lymph nodes are subcentimeter in size but appear slightly larger than on 03/28/2019. Difficult to exclude a lymphoproliferative disorder. Please refer to CT neck with contrast done the same day in further evaluation. 5. Slight compression of the T12 superior endplate is new but age indeterminate. 6. Aortic atherosclerosis (ICD10-I70.0). Coronary artery calcification. 7. Enlarged pulmonary arteries, indicative of pulmonary arterial hypertension.     Electronically Signed   By: Shearon Denis M.D.   On: 03/19/2021 15:12   Xxxxx  IMPRESSION: Neck CT September 2022 Mildly prominent lymph nodes in the neck bilaterally left greater than right. Cluster of left supraclavicular and left posterior lymph nodes all under 1 cm.   Largest lymph node in the neck is a right level 3 lymph node measuring 12 mm on axial image 75.     Electronically Signed   By: Anastasio Balsam M.D.   On: 03/19/2021 15:24   Xxxx IMPRESSIONS echocardiogram September 2022    1. Left ventricular ejection fraction, by estimation, is 65 to 70%. The  left ventricle has normal function. The left ventricle has no regional  wall motion abnormalities. Left ventricular diastolic parameters are  consistent with Grade I diastolic  dysfunction (impaired relaxation).   2. Right ventricular systolic function is normal. The right ventricular  size is normal. Tricuspid regurgitation signal is inadequate for assessing  PA pressure.   3. Left atrial size was mildly dilated.   4. The mitral valve is abnormal. Trivial mitral valve regurgitation. No  evidence of mitral stenosis.   5. The aortic valve is  tricuspid. There is mild calcification  of the  aortic valve. There is moderate thickening of the aortic valve. Aortic  valve regurgitation is not visualized. Mild aortic valve stenosis. Aortic  valve area, by VTI measures 1.62 cm.  Aortic valve mean gradient measures 12.0 mmHg. Aortic valve Vmax measures  2.31 m/s. DI is 0.43.   6. Aortic dilatation noted. There is borderline dilatation of the aortic  root, measuring 39 mm. There is borderline dilatation of the ascending  aorta, measuring 39 mm.   7. The inferior vena cava is normal in size with <50% respiratory  variability, suggesting right atrial pressure of 8 mmHg.   Comparison(s): Changes from prior study are noted. 03/02/2017: LVEF 60-65%,  mild LVH, grade 1 DD, aortic root 39 mm, no aortic stenosis.   OV 06/17/2021  Subjective:  Patient ID: Kenneth [name], male , DOB: 18-Dec-1940 , age 89 y.o. 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     HPI SHAILEN UGALDE 83 y.o. 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  Since his last visit from ILD standpoint he continues to be stable.  His walking desaturation test is stable.  However he is reporting more shortness of breath but this is directly related to more fatigue.  Review of the labs indicate in October 2022 he had mild anemia and also mild hyponatremia.  In the past radiation therapy for prostate made him extremely tired.  He is also on pirfenidone  that can cause tiredness.  He now has lymphoma also that can cause tiredness.  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  He seen Dr. Jacquelyne Matte.  They are planning to start R-CHOP treatment after Christmas.  He is frustrated by the recurrence of various medical illnesses.  The second cancer since his IPF diagnosis.  In terms of his IPF: He is now on generic pirfenidone .  He feels the nausea is worse.  He wants to switch to branded Esbriet .  Gave him donor sample of Esbriet  by [employer].  Lot number [id number] with expiration June 2025 [unopened bottle], lot number [id number] with expiration 05/2023 [unopened bottle and also lot number [id number] with expiration 12/2023 [partial bottle].  He is asked me to send a message to the pharmacist to make the switch.  He says even if it is more expensive he will play for the branded Esbriet  because it is better quality of life.  He is worried that nausea and other side effects will get worse particularly once he starts chemotherapy.   No results found.   OV 09/18/2021  Subjective:  Patient ID: Kenneth [name], male , DOB: 08-22-1940 , age 53 y.o. , MRN: [id number] , ADDRESS: [address] [place] [address] PCP Aldo Hun, MD Patient Care Team: Aldo Hun, MD as PCP - General (Internal Medicine) Judyth Nunnery, RN as Oncology Nurse Navigator [place], Jordis Nevins, RN as Oncology Nurse Navigator  This Provider for this visit: Treatment Team:  Attending Provider: Maire Scot, MD  Follow-up idiopathic pulmonary fibrosis.  Diagnosis towards the end of 2018.  On pirfenidone .  Last CT scan of the chest September 2020. - > sept 2022  Underlying irritable bowel syndrome  Initial weight loss with pirfenidone  and then stabilized  History of prostate cancer  New diagnosis of B-cell lymphoma cervical lymphadenopathy -late 2022  Multifactorial fatigue  - mild anemia OCt 2022  - mild hypontermia OCt 2022  09/18/2021 -    Chief Complaint  Patient presents with   Follow-up    PFT performed today.  Pt states he is about the same since last visit. States that he is currently undergoing chemotherapy due to diagnosis of lymphoma.     HPI KWASI LEEDS 83 y.o. -returns for follow-up.  He is taking Esbriet  instead of generic pirfenidone .  He is also taking 3 little pills 3 times daily.  He finds that Esbriet  to be better than generic pirfenidone .  He finds 3 little pills better than 1 whole pill in terms of nausea.  Still is dealing with a lot of nausea both from the chemo and Esbriet .  He says yesterday was really bad day with nausea.  He does take Zofran  but we discussed and said he will try taking the Zofran  30 to 60 minutes before the Esbriet .  Nevertheless he has been able to gain some weight.  He is actually feeling slightly less short of breath.  He is able to do more particularly after his back pain has resolved.  Nevertheless he is on antiprostate cancer therapy.  He is also undergoing chemotherapy from his B-cell lymphoma.  His mood is better.  He says he is socially isolating significantly because of his pulm fibrosis and cancer history.  He wanted to know if he could go out.  He did go to [employer] for show but wore a mask.  I did indicate to him that he could follow the CDC map to see prevalence of respiratory viruses and when the problem is overall low he could take more risk.  Also like being outdoors is less risky.  If he is endorsing particularly a cluster putting a mask on reduces his risk.  But I did say that he could use these guardrails and be more active so he can get some socialization and help his mood.  Can also give him purpose.  He is now sold his house and is planning to move into [facility].  In terms of his walking desaturation test and symptoms he stable.  In terms of his pulmonary function test his DLCO is stable but his FVC is down.  He is feeling similar.  Most of the decline  seems to be from several years ago but most recently appears to be stable overall. We did discuss clinical trials but cancer generally is an exclusion but otherwise he would be interested.  We did discuss potential for doing inhaled treprostinil trial.  We did discuss the fact the neck step and standard of care would be oxygen  if he were to decline but so far he has held good.  He recently lost his friend [facility] who also had pulmonary fibrosis.     He has upcoming [employer] appointment but he missed the 1 earlier in the year because of all ischemia.  He is thinking about holding off on going to [facility] and just following here.  I was fine with this.   OV 10/28/2021  Subjective:  Patient ID:  Kenneth [name], male , DOB: 1941/03/07 , age 73 y.o. , MRN: [id number] , ADDRESS: [address] [place] [address] PCP Aldo Hun, MD Patient Care Team: Aldo Hun, MD as PCP - General (Internal Medicine) Judyth Nunnery, RN as Oncology Nurse Navigator [place], Jordis Nevins, RN as Oncology Nurse Navigator  This Provider for this visit: Treatment Team:  Attending Provider: Maire Scot, MD    10/28/2021 -   Chief Complaint  Patient presents with   Acute Visit    Pt states since his last chemo treatment, he has been having problems with his breathing and also states that his pulse has been elevated.     Kenneth [name] 83 y.o. -presents with his wife.  Status meeting his wife for the first time but she reminded me that she had made him early on in the course of his illness.  He is finished cycles of R-CHOP treatment for his lymphoma.  On 10/23/2021 Thursday he saw a nurse practitioner in oncology.  At that point it was noticed that he had finished mini CHOP treatment 5 rounds 10/02/2021.  He did complete his 6 cycle.  He was feeling well.  His resting heart rate was 82.  Pulse ox at rest was 100%.  His white count was observed to be slightly low.  They discussed several options and decided to  proceed with chemotherapy followed by UDENYCA  single dose on 10/24/2021 Friday.  He was doing fairly okay then on Sunday, 10/26/2021 he noticed resting tachycardia heart rate 125 but no fever.  At no point any fever.  No cough no chills no nausea no vomiting no diarrhea.  The same thing persisted even on Monday, 10/27/2021.  He noticed that if he walked his heart rate was up to 140 he became short of breath and stop.  His pulse ox would drop to 90% but no abnormal desaturations.  He started getting worried.  He went to [employer] yesterday 10/27/2021.  EKG reviewed sinus tachycardia with some ectopic atrial beats.  Nothing acute.    Labs: Found to have mild hyponatremia.  Also found to have mild anemia white count in response to Udenyca  went from low to  high.No D-dimer.  No troponin no BNP checked.  Last echo and CT scan of the chest in September 2022.  Walking desaturation test is definitely more tachycardic than baseline.  Associated with this he is also got exaggerated pulse ox drop although still adequate for simple walking test.  His pace is slower.   We repeated an EKG here: Personally visualized.  Sinus tachycardia heart rate 120 approximately.  Nothing acute.      OV 01/09/2022  Subjective:  Patient ID: Kenneth [name], male , DOB: 12-06-1940 , age 25 y.o. , MRN: [id number] , ADDRESS: [address] [place] [facility] [address] PCP Aldo Hun, MD Patient Care Team: Aldo Hun, MD as PCP - General (Internal Medicine) Judyth Nunnery, RN as Oncology Nurse Navigator [place], Jordis Nevins, RN as Oncology Nurse Navigator  This Provider for this visit: Treatment Team:  Attending Provider: Maire Scot, MD    01/09/2022 -   Chief Complaint  Patient presents with   Follow-up    PFT performed today. Pt states that he feels like he may be a little worse since last visit. States that he has had some problems with congestion which he wonders could be due to the weather.   HPI JOHNATON ORMOND 83 y.o. 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  At this point in time he is finished his chemotherapy.  In April 2023 he did have some tachycardic response following the last chemo.  Cardiac work-up ended up being fine.  He says overall he is been feeling fine.  He is now relocated to a retirement home in [place].  [place]  During this time his pulse ox immediately after exertion was always in the 90s.  He did not feel that his IPF was getting worse when he was there.  He just came back yesterday.  However for the last few days he is having increased fatigue nasal congestion.  He also feels his left ear is blocked.  He tried to do pulmonary function test today.  He felt he did not give a good effort particularly the DLCO.  Consistent with this his numbers are much worse.  His FVC shows a 7% decline compared to March 2023 and DLCO 14% decline compared to March 2023.  His symptom score seems similar to September 2022.  His walking desaturation test is also stable on this level ground.  His weight loss is stabilized since he is at 168 pounds.  He is frustrated by this.  Is also frustrated by his multiple health issues.  The pirfenidone  he is taking the generic version.  He is tolerating it but is requiring Zofran  which then causes constipation.  Emotionally he is looking for a break from all his health issues  He plans [place] the mountains [place] today.     01/26/2022: Today - follow up Patient presents today for follow up after being treated for sinusitis. He reports feeling better but still has issues with nasal congestion and pressure in his left ear. They did improve with prednisone  taper. These are problems he has been dealing with for a while now. He has tried multiple different nasal sprays with some relief. 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 He is sure this was the result of his worsening pulmonary function testing. He is  working on getting in to see Dr. Tellis Feathers with ENT. Otherwise, no concerns today. Breathing is overall stable. He gets winded with stair climbing but otherwise, no issues. Doesn't feel like his activity is limited by his breathing. He's planning to go back to the mountains in the next few weeks.     OV 04/07/2022  Subjective:  Patient ID: Kenneth [name], male , DOB: Oct 20, 1940 , age 45 y.o. , MRN: [id number] , ADDRESS: [address] [place] [facility] [address] PCP Aldo Hun, MD Patient Care Team: Aldo Hun, MD as PCP - General (Internal Medicine) Judyth Nunnery, RN as Oncology Nurse Navigator [place], Jordis Nevins, RN as Oncology Nurse Navigator  This Provider for this visit: Treatment Team:  Attending Provider: Maire Scot, MD   04/07/2022 -   Chief Complaint  Patient presents with   Follow-up    Spiro/DLCO done today. Breathing has been gradually worse since the last visit. He has had some muscle fatigue with exertion. He has some occ cough- non prod.      HPI DEMONTRE KIMBER 83 y.o. -returns for follow-up.  He feels he is slowly progressing in terms of his IPF.  In fact his shortness of breath scores are slightly worse.  Last time his pulmonary function test showed a decline he thought was from sinusitis.  Sinusitis is cleared up but he says today when he did  his PFTs he felt that he was declining.  He did have some cough he is having some new cough for the last few weeks.  He is worried this might be from worsening IPF but he typically gets cough this time of the year from fall allergy  season.  Pulmonary function test shows an improvement compared to the most recent one but overall compared to earlier this year there is a decline.  I shared these results with him.  He is more worried about his fatigue.  However he did play some golf.  He says that he is significantly fatigued.  He has been going on for 6 to 12 months initially thought was related to cancer and chemotherapy but he says it  is the all the time.  The fatigue persist despite anemia correction despite his chemotherapy completing is still got fatigue.  He feels it happens episodically but then it is there all over the body and it exhausting.  He believes his primary care is checked his vitamin D  levels because he is on replacement.  He believes his TSH and hemoglobin A1c to be normal.  Apparently primary care physician has asked him to consider getting a CT coronaries.  He has seen Dr. Peder Bourdon in the past.  At the time it was from acute chemotherapy consideration.  Dr. Mitzie Anda at the time got an echocardiogram.  He does have enlarged pulmonary artery on the CT scan and he does have coronary artery calcification.  Back then in the spring during chemo side effects patient had deferred right heart catheterization.  This time of reach out to Dr. Mitzie Anda   He is due for an IPF-Pro registry visit today    OV 06/23/2022  Subjective:  Patient ID: Kenneth [name], male , DOB: 02-Apr-1941 , age 44 y.o. , MRN: [id number] , ADDRESS: [address] [place] [facility] [address] PCP Aldo Hun, MD Patient Care Team: Aldo Hun, MD as PCP - General (Internal Medicine) Judyth Nunnery, RN as Oncology Nurse Navigator O'[place]elley, Jordis Nevins, RN as Oncology Nurse Navigator  This Provider for this visit: Treatment Team:  Attending Provider: Maire Scot, MD    06/23/2022 -   Chief Complaint  Patient presents with   Follow-up    Pt states he has been doing okay since last visit.     HPI TRIEU KOREY 83 y.o. 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  Overall he is in a better state of health.  His fatigue is improved.  His shortness of breath is also improved somewhat.  He continues pirfenidone  at full dose.  His last chemotherapy was in J May 2023.  His last androgen deprivation treatment was in July 2023.  He believes the chemo and the androgen deprivation was causing his fatigue.  With the passage of time he is better.  He is now living  in [facility].  He is doing daily exercise.  He says that he can go well on a flat ground but when he does uphill sometimes he has to stop but most of the time he is doing well.  He does workout with exercise bike.  He states his tachycardia has improved with more more physical conditioning.  However he does get fatigued and short of breath when he tries to play golf.  His GI issues in combination of irritable bowel syndrome and pirfenidone  continue but it is mild level.  He says 2023 has been a rough year and has been emotional.  Therefore primary care physician  started him on Pristiq on the advice of his daughter.  He says since then he somewhat better.  He did have sinusitis a few weeks ago and now he is better.  Take prednisone  and cephalosporin.  Social note: We talked about masking particularly with the surgery for respiratory viruses.  On July 25, 2022 he is going to get the Danney Dutton award for coverage at [facility].  He is going to talk about pulmonary fibrosis.  I encouraged him to be an advocate for pulmonary fibrosis.  He said in the past is The diagnosis private.  I did tell him that given the serious nature of the disease and rare disease I would encourage him to be as open as he wants to so there is more awareness and advocacy for the disease.  I also advised him to mask for this gathering to the extent possible as a risk reduction against respiratory viruses.    OV 10/13/2022  Subjective:  Patient ID: Kenneth [name], male , DOB: [birth year] , age 30 y.o. , MRN: [id number] , ADDRESS: [address] [place] [facility] [address] PCP Aldo Hun, MD Patient Care Team: Aldo Hun, MD as PCP - General (Internal Medicine) Judyth Nunnery, RN as Oncology Nurse Navigator O'Kelley, Jordis Nevins, RN as Oncology Nurse Navigator  This Provider for this visit: Treatment Team:  Attending Provider: Maire Scot, MD  10/13/2022 -   Chief Complaint  Patient presents with   Follow-up     F/up     HPI Kenneth [name] 83 y.o. -returns for follow-up.  On 08/11/2022 he underwent elective neck surgery by Dr. Gwendlyn Lemmings because of cervical spondylosis and radiculopathy.  Since then the pain is resolved.  After this on 08/23/2022 he ended up with tachycardia in the ED.  Pulmonary embolism ruled out.  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  He is on beta-blocker.  He says he is a little more short of breath doing things around the house.  He is also having significant PVCs.  He is on Toprol .  His chronic anemia continues.  The PVC issue is new he is frustrated by this.  He had pulmonary function test today and it shows a definite decline compared to a year ago.  He is also lost weight with full dose pirfenidone .  He continues have significant fatigue.  Is also reporting some new nausea.  I did indicate to him that pirfenidone  is the most likely culprit for the trial of weight loss fatigue and nausea.  We discussed taking a holiday with the pirfenidone  but we took a shared decision making to reduce the dose and monitor.  In addition for his PVCs need to see if his ILD has gotten worse.  Which it has on the pulmonary function test.  He has agreed to get a overnight pulse oximetry study done.  He is somewhat despondent because of all his medical issues.       OV 01/04/2023  Subjective:  Patient ID: Kenneth [name], male , DOB: 13-Nov-1940 , age 24 y.o. , MRN: [id number] , ADDRESS: [address] [place] [facility] [id number] PCP Aldo Hun, MD Patient Care Team: Aldo Hun, MD as PCP - General (Internal Medicine) Judyth Nunnery, RN as Oncology Nurse Navigator O'Kelley, Jordis Nevins, RN as Oncology Nurse Navigator  This Provider for this visit: Treatment Team:  Attending Provider: Maire Scot, MD       01/04/2023 -   Chief Complaint  Patient presents with  Follow-up    F/up on PFT     HPI HTOO KANIECKI 83 y.o. -returns for follow-up.  In this visit he reports that after moving to  [facility] and the food quality is good he has gained weight.  His appetite is good.  His weight is stable.  However he does feel that he is declining.  He says symptoms are worse.  In fact the dyspnea symptom score shows that he is slowly declining with more dyspnea.  He is back on full dose pirfenidone .  The lower dose did not improve his fatigue.  We discussed and we agreed that his fatigue is likely multifactorial from the disease other medical issues and medications including Lopressor  for PVC.  Today's exercise hypoxemia test was normal.  He is somewhat frustrated but also sanguine about the fact that he is declining.  His pulmonary function test does show decline.  I personally reviewed it.  Last set of labs was in May 2024 with a creatinine of 0.97 mg percent.  Last liver function test was in April 2024 normal.   Encounter therapeutic monitoring with high risk prescription pirfenidone   - Esbriet /Pirfenidone  requires intensive drug monitoring due to high concerns for Adverse effects of , including  Drug Induced Liver Injury, significant GI side effects that include but not limited to Diarrhea, Nausea, Vomiting,  and other system side effects that include Fatigue, headaches, weight loss and other side effects such as skin rash. These will be monitored with  blood work such as LFT initially once a month for 6 months and then quarterly  -He will need liver function test today.   Other issues - Did see Dr. Scherrie Curt 10/06/2022.  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  -We discussed the progression.  We felt the next best option is clinical trials but a lot of the trials excluded patients who have cancer.  Did express that currently 2 trials have high potential for future approval.  This including inhaled treprostinil versus placebo.  The other is Bexotegrast versus placebo.  Did indicate to him we will prescreen him and then decide which 1 he would be suitable for.  He is open to the  idea of clinical trials.  Did communicate with the research coordinator Marylynn Soho       OV 05/13/2023  Subjective:  Patient ID: Kenneth [name], male , DOB: [birth year] , age 52 y.o. , MRN: [id number] , ADDRESS: [address] [place] [facility] [address] PCP Aldo Hun, MD Patient Care Team: Aldo Hun, MD as PCP - General (Internal Medicine) Judyth Nunnery, RN as Oncology Nurse Navigator O'Kelley, Jordis Nevins, RN as Oncology Nurse Navigator  This Provider for this visit: Treatment Team:  Attending Provider: Maire Scot, MD    05/13/2023 -   Chief Complaint  Patient presents with   Follow-up    Pt denies any concerns, still on esbriet .    HPI Kenneth [name] 83 y.o. -returns for follow-up.  He is now being randomized on inhaled treprostinil versus placebo on the [facility] 301 study.  He is taking the nebulizer 4 times daily and each time 5 times.  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  He has no sore throat no chest pain no headache no dizziness no hemoptysis.  He feels good.  He continues his pirfenidone .  He is maintaining his weight.  His pulmonary function test shows  slight improvement after continued progression.  He is back to being at the level of spring 2024.  He was excited to hear this.  We did tell him that we have no idea if he is on placebo the real drug.  He plans to go to [place] .  I did tell him that he needs to take his study medication with him and follow the instructions to the T.  He has had labs with research and I will review those.     PFT  OV 09/16/2023  Subjective:  Patient ID: Kenneth [name], male , DOB: [birth year] , age 57 y.o. , MRN: [id number] , ADDRESS: [address] [place] [facility] [address] PCP Aldo Hun, MD Patient Care Team: Aldo Hun, MD as PCP - General (Internal Medicine) Judyth Nunnery, RN as Oncology Nurse Navigator O'[place]elley, Jordis Nevins, RN as Oncology Nurse  Navigator  This Provider for this visit: Treatment Team:  Attending Provider: Maire Scot, MD   COVID hypoxemic respiratory failure requiring oxygen  mid February 2025 and admission to [facility]   09/16/2023 -   Chief Complaint  Patient presents with   Hospitalization Follow-up    Recently admitted with covid. He feels he developed sinus infection- started cefdinir and this has improved. Breathing is getting better each day.      HPI MATEO SHAPIRO 83 y.o. -returns for posthospital follow-up.  Mid February 2025 was hospitalized for COVID-19 requiring oxygen .  Since then he is significantly improved.  He states he is walking around the house without oxygen .  There is a huge improvement because when I spoke to him even a week or 2 after the hospitalization he was in no position team and make the office visit.  He says he still fatigued but he needs physical therapy he did not want to go to pulmonary rehabilitation because he is working out at the place where he lives.  He says he got excellent care at [facility] from the hospitalist service.  He is worried about a nodule report on his chest x-ray at admission but I visualized it and showed it to him I think is just ILD infiltrates and COVID infiltrates but we took shared decision making to get a chest x-ray today.  With his fatigue I reviewed his labs he is still anemic he also had low sodium so we will recheck these.  Is tolerating pirfenidone  well but he needs safety monitoring so we will check liver test.  Given his improvement in oxygen  we decided to just uses oxygen  with exertion past 300 feet.  Also test overnight pulse oximetry on room air to see if he still needs oxygen  at night  He is on research protocol of inhaled treprostinil versus placebo.  He had expressed lack of interest when he was fatigued posthospitalization and taking the study drug or doing the research study visits.  Currently the study drug is on holiday.  I had a  conversation with him.  I showed him the preliminary data and shared my anecdotal experience of patients/subjects in the study.  Based on this because it establish safety profile of the inhaled treprostinil he is agreed to continue restarting it and going back on schedule with the research drug.  He did express inconvenience with the nebulizer schedule but he understands this part of what is required     Last Weight  Most recent update: 09/16/2023  8:41 AM    Weight  76 kg (167 lb 9.6 oz)  OV 11/04/2023  Subjective:  Patient ID: Kenneth [name], male , DOB: 03-27-1941 , age 27 y.o. , MRN: [id number] , ADDRESS: [address] [place] [facility] [address] PCP Aldo Hun, MD Patient Care Team: Aldo Hun, MD as PCP - General (Internal Medicine) Judyth Nunnery, RN as Oncology Nurse Navigator O'Kelley, Jordis Nevins, RN as Oncology Nurse Navigator  This Provider for this visit: Treatment Team:  Attending Provider: Maire Scot, MD    11/04/2023 -   Chief Complaint  Patient presents with   Acute Visit    Shortness of breath     Follow-up idiopathic pulmonary fibrosis.  Diagnosis towards the end of 2018.  On pirfenidone .  Last CT scan of the chest September 2020. 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  Depression and started on Pristiq by primary care physician in 2023  Esbriet /Pirfenidone  requires intensive drug monitoring due to high concerns for Adverse effects of , including  Drug Induced Liver Injury, significant GI side effects that include but not limited to Diarrhea, Nausea, Vomiting,  and other system side effects that include Fatigue, headaches, weight loss and other side effects such as skin rash. These will be monitored with  blood work such as LFT initially once a month for 6 months and then quarterly   HPI TAYVEN FURE 83 y.o. -Dr. Linder Revere is here because he he texted me saying he was not well.  He ended up in the ER with concerns of PVCs.  Today saw Dr. Peder Bourdon and he reports that are new PACs.  I have visualized the EKG and I agree with this.  Lopressor  is being increased but patient tells me that he is just feeling he is declining his shortness of breath is worse.  Is also more fatigued.  Review of the labs from yesterday indicate normal troponin and BNP.  Pulmonary function test indicates that his FVC at [facility] and subsequently went at research are declining more precipitously since his COVID-19 hospitalization mid February 2025.  At that point in time PE was ruled out but we do not have a subsequent D-dimer.  He is not on anticoagulation.  He is finding it difficult to do things he is always on oxygen .  However ironically was able to complete a 6-minute walk test at [facility] without oxygen  and even today exercise hypoxemia test showed he did not desaturate but he feels subjectively better only if uses oxygen .  He says he is visualized his own  chest x-ray and this makes him feel depressed.  Of note he is  attending rehabilitation which is helping him.      Last Weight  Most recent update: 11/04/2023  4:30 PM    Weight  75.3 kg (166 lb)                SYMPTOM SCALE - ILD 08/23/2018  04/21/2019  10/16/2019  04/11/2020 168# 10/22/2020 170# - esbriet  and XRT for prostate 04/03/2021 168# 06/17/2021 168# lymphoma 09/18/2021 172# 01/09/2022 168# 04/07/2022 173# 06/23/2022 171#  10/13/2022 164# esbiret 01/04/2023 164# Esbret full dose 05/13/2023 Esbriet  + [facility] study 167# 09/16/2023 167# Covid admit feb 2025 11/04/2023 166#  O2 use ra e ra   ra ra ra ra ra ra ra ra ra  ra  Shortness of Breath 0 -> 5 scale with 5 being worst (score 6 If unable to do)                 At rest 0 0 0 0 0 0 0 0 0 0  0 0 0 0 0 1  Simple tasks - showers, clothes change, eating, shaving *0** 0 0 0 0 0 0 0 0 1 0  1 1 1 2 4   Household (dishes, doing bed, laundry) 0 0 0 0 0 1 1 0 1 1  1 2 3 2 2 5   Shopping 0 0 0 0 0 0 1 0 1 1  1 2 3 2 3 4   Walking level at own pace 0 0 0 0 0 0 1 0 1 1  1 2 1 1 3 2   Walking up Stairs 2 2 2 1 2 2 3 2 3 4 3 4 4 3 5 5   Total (40 - 48) Dyspnea Score 2 2 2 1 2 3 6 2 6 8 6 11 12 9 15 21   How bad is your cough? 0 0 0 0 0 0 1 2 0 1  0 0 0 0 0 0  How bad is your fatigue 1 due to esbriet  0 2 0 2 1 3.5 2 3 3 2  2.5 3 3 3  3.5  nausea   1 0 1 1 3 0 1 1 1 2  3 2 1 2   vomit   0 0 0 0 0 0 0 0 0 0  0 0 0 1  diarrhea   1 1 3 1 2  0 1 1 1 2 1  0 1 1  anxiety   0 0 0 1 1 1 2 0 0 1  2 1 1 1   depresso0   0 0 0 1 1 1 2 0 1 1  2 1 0 0       Simple office walk 185 feet x  3 laps goal with forehead probe 12/07/2017 Weight 180# 03/08/2018 Weight 174.9 05/10/2018 Weight 174# 08/23/2018 Weight 178# 04/21/2019 Weight 170# 10/16/2019 173# 04/11/2020 168# 10/22/2020 170# 04/03/2021 168# 06/17/2021 168# 09/18/2021  10/28/2021  01/09/2022 168# 06/23/2022 171# 01/04/2023  05/13/2023  09/16/2023    O2 used Room air Room air Room air Room air Room air Room air   ra ra ra  ra ra ra ra ra   Number laps completed 3 3 3 3 3 3    3  3     Sit stand x 15 Sist stand x 10 Walked 2 of 3 las   Comments about pace Moderate pace Mod pace Mod fast pace fast  fast   fast   Avg pace - slower than before  Resting Pulse Ox/HR 100% and 83/min 100% ad 85/min 100% and 91/min 100% and 82/min  100% asnd 91 100% and 83/min 98% and 94/min 100% and 82 100% and 93 100% and 78 99% and H 126 99% and HR 88 100% ahd HR 87 97% andHR 70 95$ and HR 72 99% and HR 95   Final Pulse Ox/HR 99% and 98/min 98$ and 104/min 98% and 108/min 98% and 100/min  96% and 106/mom 98% and 96/mn 95% and 110.min 97% and 105 97% ad 109 97% and 94 92% and HR 143 97% and HR 111 96% and HR 110 96% and HR 96 92% and HR 92 93% and HR 107   Desaturated </= 88% no no no no  no              Desaturated <= 3% points no no ni no  yesm 4 points  Yes, 3 points Yes, 3 poitn Yes 3 pomnts Yes 3 Yes, 7 points  Yes 4 ponts      Got Tachycardic >/= 90/min yes yes yes yes  yes  yes yes yes yes yes        Symptoms at end of test No complaints No complaints  No complaints  Mild dyspnea  no Mild dyspnea Mild dyspnea Mild dysnea Modeate dyspnea  No complaints Little dyspnea     Miscellaneous comments x ? sligh increase in tachy/stable ? incraeased tachty       Stable since April 2021                 SIT STAND TEST - goal 15 times   11/04/2023    O2 used Room air 5-6 min   PRobe - finter or forehead forehead   Number sit and stand completed - goal 15 15   Time taken to complete 1 min   Resting Pulse Ox/HR/Dyspnea  97% and 83/min and dyspnea of 3/10    Peak measures 9 % and 96/min and dyspnea of 8/10   Final Pulse Ox/HR 94% and 93/min and dyspnea of 6/10   Desaturated </= 88% no   Desaturated <= 3% points yes   Got Tachycardic >/= 90/min yes   Miscellaneous comments 2 points       PFT     Latest Ref Rng & Units 10/11/23 09/28/23 at [facility] 05/13/2023   12:56 PM 01/04/2023    8:51 AM 10/09/2022    4:21 PM 04/07/2022    1:46 PM 01/09/2022    9:17 AM 09/18/2021    8:52 AM 04/03/2021     9:21 AM  PFT Results  FVC-Pre L 1.76 1.97 2.37  2.27  2.33  2.53  2.49  2.68  2.96   FVC-Predicted Pre %   64  62  63  68  64  68  75   Pre FEV1/FVC % %   86  84  86  86  87  84  81   FEV1-Pre L   2.03  1.92  2.01  2.18  2.16  2.25  2.38   FEV1-Predicted Pre %   78  74  77  83  78  80  85   DLCO uncorrected ml/min/mmHg 7.5 7.9 11.71  10.66  11.48  12.36  12.55  13.07  16.15   DLCO UNC% %   51  46  50  53  53  54  67   DLCO corrected ml/min/mmHg   11.71  10.66  12.81  13.57  12.55  14.64  16.15   DLCO COR %Predicted %   51  46  55  59  53  61  67   DLVA Predicted %  296 meters on Room AIR. Pulse ox 92-99%. Did not need o2 81  77  94  89  91  105  91     ECHO 09/06/23   IMPRESSIONS     1. 3D EF and strain not performed . Left ventricular ejection fraction,  by estimation, is 60 to 65%. The left ventricle has normal function. The  left ventricle has no regional wall motion abnormalities. Left ventricular  diastolic parameters are  consistent with Grade I diastolic dysfunction (impaired relaxation).   2. Right ventricular systolic function is normal. The right ventricular  size is normal.   3. The mitral valve is abnormal. Trivial mitral valve regurgitation. No  evidence of mitral stenosis. Moderate mitral annular calcification.   4. Gradients lower than seen on TTE 09/15/22 when mean gradient was 10.7  and peak 20.7 and AVA 1.43 cm2 with DVI 0.45. The aortic valve is  tricuspid. There is moderate calcification of the aortic valve. There is  moderate thickening of the aortic valve.  Aortic valve regurgitation is not visualized. Aortic valve  sclerosis/calcification is present, without any evidence of aortic  stenosis.   5. Aortic dilatation noted. There is mild dilatation of the ascending  aorta, measuring 40 mm.   6. The inferior vena cava is normal in size with greater than 50%  respiratory variability, suggesting right atrial pressure of 3 mmHg.    LAB RESULTS last 96  hours DG Chest Port 1 View Result Date: 11/03/2023 CLINICAL DATA:  Shortness of breath. Fatigue. Chest pain. Symptoms starting yesterday. History of pulmonary fibrosis. EXAM: PORTABLE CHEST 1 VIEW COMPARISON:  09/16/2023 FINDINGS: Low lung volumes. Diffuse interstitial changes throughout the lungs may represent fibrosis or edema. Similar appearance to prior study. Diffuse cardiac enlargement. Small bilateral pleural effusions. No pneumothorax. Degenerative changes in the spine. Postoperative changes in the cervical spine. IMPRESSION: Cardiac enlargement with small pleural effusions. Diffuse interstitial changes with low lung volumes possibly representing fibrosis or edema. Similar appearance to previous study. Electronically Signed   By: Boyce Byes M.D.   On: 11/03/2023 16:15         has a past medical history of Arthritis, Depression, Diffuse large B cell lymphoma (HCC) (2023), Diverticulosis of colon (without mention of hemorrhage), GERD (gastroesophageal reflux disease), Hiatal hernia, HLD (hyperlipidemia), Internal hemorrhoids without mention of complication, Irritable bowel syndrome, Other specified gastritis without mention of hemorrhage, Prostate cancer (HCC) (2022), Pulmonary fibrosis (HCC), and Stress fracture (02/2021).   reports that he has never smoked. He has never used smokeless tobacco.  Past Surgical History:  Procedure Laterality Date   ANTERIOR CERVICAL DECOMP/DISCECTOMY FUSION N/A 08/11/2022   Procedure: Anterior Cervical Decompression/Discectomy Fusion - Cervical Four-Cervical Five -  Cervical Five-Cervical Six - Cerivcal Six-Cerival Seven;  Surgeon: Agustina Aldrich, MD;  Location: [facility] OR;  Service: Neurosurgery;  Laterality: N/A;   CARPAL TUNNEL RELEASE     left   CATARACT EXTRACTION  06/2011   bilateral   DEEP NECK LYMPH NODE BIOPSY / EXCISION  03/25/2021   INGUINAL HERNIA REPAIR Right 09/05/2019   Procedure: OPEN REPAIR RIGHT INGUINAL HERNIA WITH MESH;  Surgeon: Oralee Billow, MD;  Location: [facility] ORS;  Service: General;  Laterality: Right;   LUMBAR DISC SURGERY  1986, 1995   x 2   LUMBAR LAMINECTOMY/DECOMPRESSION MICRODISCECTOMY Left 07/11/2013  Procedure: LUMBAR ONE TO TWO, LUMBAR TWO TO THREE, LUMBAR THREE TO FOUR LUMBAR LAMINECTOMY/DECOMPRESSION MICRODISCECTOMY 3 LEVELS;  Surgeon: Baruch Bosch, MD;  Location: [facility] NEURO ORS;  Service: Neurosurgery;  Laterality: Left;   NASAL SINUS SURGERY     RIGHT/LEFT HEART CATH AND CORONARY ANGIOGRAPHY N/A 04/14/2022   Procedure: RIGHT/LEFT HEART CATH AND CORONARY ANGIOGRAPHY;  Surgeon: Darlis Eisenmenger, MD;  Location: [facility] INVASIVE CV LAB;  Service: Cardiovascular;  Laterality: N/A;    No Known Allergies  Immunization History  Administered Date(s) Administered   Fluad Quad(high Dose 65+) 03/18/2020   Influenza Split 10/03/2009, 03/28/2010, 04/14/2011, 03/22/2012, 03/09/2013, 03/28/2014   Influenza, High Dose Seasonal PF 03/19/2016, 03/06/2017, 04/09/2018, 03/07/2019, 04/06/2020, 04/11/2022   Influenza, Quadrivalent, Recombinant, Inj, Pf 03/12/2018, 03/10/2019, 04/06/2020, 04/07/2023   Influenza,inj,Quad PF,6+ Mos 04/04/2014, 03/26/2015, 04/05/2016   Influenza-Unspecified 04/05/2016, 04/05/2021, 04/25/2023   PFIZER Comirnaty(Gray Top)Covid-19 Tri-Sucrose Vaccine 10/15/2020   PFIZER(Purple Top)SARS-COV-2 Vaccination 07/20/2019, 08/10/2019, 02/27/2020, 03/23/2021   PNEUMOCOCCAL CONJUGATE-20 02/18/2021   Pfizer Covid-19 Vaccine Bivalent Booster 5y-11y 10/05/2022   Pneumococcal Conjugate-13 03/30/2013, 07/31/2013, 04/26/2018   Pneumococcal Polysaccharide-23 10/03/2009, 01/14/2010, 03/31/2018, 02/18/2021   Pneumococcal-Unspecified 04/05/2016   Td 10/20/2016   Tdap 10/03/2009   Unspecified SARS-COV-2 Vaccination 03/21/2023   Zoster Recombinant(Shingrix) 01/03/2017   Zoster, Live 10/03/2009    Family History  Problem Relation Age of Onset   Heart failure Father 75   Bladder Cancer Mother    Breast cancer Mother     Colon cancer Neg Hx    Pancreatic cancer Neg Hx    Rectal cancer Neg Hx    Stomach cancer Neg Hx    Prostate cancer Neg Hx      Current Outpatient Medications:    acetaminophen  (TYLENOL ) 500 MG tablet, Take 1,000 mg by mouth every 6 (six) hours as needed for moderate pain., Disp: , Rfl:    acetaminophen -codeine  (TYLENOL  #3) 300-30 MG tablet, Take 1 tablet by mouth every 4 (four) hours as needed (for pain)., Disp: , Rfl:    albuterol  (VENTOLIN  HFA) 108 (90 Base) MCG/ACT inhaler, Inhale 2 puffs into the lungs every 6 (six) hours as needed for wheezing or shortness of breath., Disp: 6.7 g, Rfl: 2   aspirin  81 MG chewable tablet, Chew 81 mg by mouth 3 (three) times a week., Disp: , Rfl:    carboxymethylcellulose (REFRESH TEARS) 0.5 % SOLN, Place 1 drop into both eyes daily as needed (dry eyes)., Disp: , Rfl:    carisoprodol  (SOMA ) 250 MG tablet, Take 175-350 mg by mouth 3 (three) times daily as needed (for muscle spasms)., Disp: , Rfl:    Cholecalciferol  (VITAMIN D -3) 1000 UNITS CAPS, Take 2,000 Units by mouth every evening., Disp: , Rfl:    CO-ENZYME Q-10 PO, Take 1 tablet by mouth every evening. , Disp: , Rfl:    Cyanocobalamin  (VITAMIN B12) 1000 MCG TBCR, Take 1,000 mcg by mouth daily., Disp: , Rfl:    denosumab  (PROLIA ) 60 MG/ML SOSY injection, Inject 60 mg into the skin every 6 (six) months. At [facility], Disp: , Rfl:    desvenlafaxine (PRISTIQ) 50 MG 24 hr tablet, Take 50 mg by mouth daily., Disp: , Rfl:    diclofenac  (VOLTAREN ) 75 MG EC tablet, Take 75 mg by mouth 2 (two) times daily as needed (pain.)., Disp: , Rfl: 2   diclofenac  sodium (VOLTAREN ) 1 % GEL, Apply 4 g topically 4 (four) times daily as needed (pain.)., Disp: , Rfl: 12   dicyclomine  (BENTYL ) 10 MG capsule, Take  one by mouth every 6 hours as needed for abdominal cramping, Disp: 30 capsule, Rfl: 1   diphenoxylate-atropine (LOMOTIL) 2.5-0.025 MG tablet, Take 1 tablet by mouth as needed for diarrhea or loose stools.,  Disp: , Rfl:    Emollient (AQUAPHOR ADV PROTECT HEALING EX), Apply 1 Application topically daily as needed (itching)., Disp: , Rfl:    Evolocumab (REPATHA) 140 MG/ML SOSY, Inject 140 mg into the skin every 30 (thirty) days., Disp: , Rfl:    Glucosamine Sulfate 500 MG TABS, Take 500 mg by mouth daily., Disp: , Rfl:    HYDROcodone -acetaminophen  (NORCO) 10-325 MG tablet, Take 1 tablet by mouth See admin instructions. Take 1 tablet by mouth every 4-6 hours as needed for pain, Disp: , Rfl:    latanoprost  (XALATAN ) 0.005 % ophthalmic solution, Place 1 drop into both eyes at bedtime., Disp: , Rfl:    loratadine  (CLARITIN ) 10 MG tablet, Take 10 mg by mouth daily., Disp: , Rfl:    meclizine  (ANTIVERT ) 25 MG tablet, Take 25 mg by mouth 3 (three) times daily as needed for dizziness., Disp: , Rfl:    metoprolol  succinate (TOPROL  XL) 25 MG 24 hr tablet, Take 1 tablet (25 mg total) by mouth every morning AND 2 tablets (50 mg total) every evening., Disp: 90 tablet, Rfl: 11   Multiple Vitamin (MULTIVITAMIN) tablet, Take 1 tablet by mouth daily with breakfast., Disp: , Rfl:    omeprazole  (PRILOSEC) 40 MG capsule, TAKE 1 CAPSULE (40 MG TOTAL) BY MOUTH DAILY., Disp: 90 capsule, Rfl: 0   ondansetron  (ZOFRAN ) 8 MG tablet, Take 1 tablet (8 mg total) by mouth every 8 (eight) hours as needed for nausea or vomiting., Disp: 30 tablet, Rfl: 1   Pirfenidone  (ESBRIET ) 801 MG TABS, Take 1 tablet (801 mg total) by mouth 3 (three) times daily with meals., Disp: 270 tablet, Rfl: 1   Probiotic Product (ALIGN) 4 MG CAPS, Take 4 mg by mouth daily., Disp: , Rfl:    SODIUM FLUORIDE 5000 PLUS 1.1 % CREA dental cream, Place 1 Application onto teeth at bedtime., Disp: , Rfl:    XHANCE  93 MCG/ACT EXHU, Place 1 spray into both nostrils 2 (two) times daily as needed (for sinus issues)., Disp: , Rfl:       Objective:   Vitals:   11/04/23 1629  BP: 136/68  Pulse: 89  SpO2: 92%  Weight: 166 lb (75.3 kg)  Height: 5\' 9"  (1.753 m)     Estimated body mass index is 24.51 kg/m as calculated from the following:   Height as of this encounter: 5\' 9"  (1.753 m).   Weight as of this encounter: 166 lb (75.3 kg).  @WEIGHTCHANGE @  [employer]   11/04/23 1629  Weight: 166 lb (75.3 kg)     Physical Exam   General: No distress.  Looks the same but always slightly labored and little more anxious when he talks. O2 at rest: yes Cane present: no Sitting in wheel chair: no Frail: no Obese: no Neuro: Alert and Oriented x 3. GCS 15. Speech normal Psych: Pleasant Resp:  Barrel Chest - no.  Wheeze - no, Crackles - yes some, No overt respiratory distress CVS: Normal heart sounds. Murmurs - n Ext: Stigmata of Connective Tissue Disease - no HEENT: Normal upper airway. PEERL +. No post nasal drip        Assessment:       ICD-10-CM   1. DOE (dyspnea on exertion)  R06.09 D-Dimer, Quantitative    Pulmonary function test  2. 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HCC)  J84.112 D-Dimer, Quantitative    Pulmonary function test    3. History of 2019 novel coronavirus disease (COVID-19)  Z86.16 D-Dimer, Quantitative    Pulmonary function test     Am concerned of either post-COVID long-haul dyspnea or worsening pulmonary fibrosis in the setting of COVID but do need to rule out pulmonary embolism in the setting of post-COVID.  1 other possibility is post COVID-related lung inflammation such as a Boop therefore we will get a D-dimer and sedimentation rate..  If the D-dimer is elevated we will need to rule out PE.  If the ESR is high we will try a course of steroids.  Will see him back in 6 weeks at that time he will need CT scan of the chest high-resolution if we have not done a CT angiogram by then and pulmonary function test.    Plan:     Patient Instructions     ICD-10-CM   1. DOE (dyspnea on exertion)  R06.09     2. 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HCC)  J84.112     3. History of 2019 novel coronavirus disease  (COVID-19)  Z86.16       Do d-dimer 11/04/2023 or 11/05/23 -0 if abnormal then need CTA -> if not do HRCT in 6 week  Continue other medications and oxygen   Do PFT in 6 weeks  Followup - 6 weeks already scheduled 12/17/23   FOLLOWUP Return for 12/17/23 with Dr Bertrum Brodie but get PFT before that.  ( Level 05 visit E&M 2024: Estb >= 40 min visit type: on-site physical face to visit  in total care time and counseling or/and coordination of care by this undersigned MD - Dr Maire Scot. This includes one or more of the following on this same day 11/04/2023: pre-charting, chart review, note writing, documentation discussion of test results, diagnostic or treatment recommendations, prognosis, risks and benefits of management options, instructions, education, compliance or risk-factor reduction. It excludes time spent by the CMA or office staff in the care of the patient. Actual time 45 min)   SIGNATURE    Dr. Maire Scot, M.D., F.C.C.P,  Pulmonary and Critical Care Medicine Staff Physician, [facility] Center Director - Interstitial Lung Disease  Program  P[facility] at [facility] [place], [place], [address]  Pager: [phone], If no answer or between  15:00h - 7:00h: call [phone] Telephone: [phone]  5:10 PM 11/04/2023

## 2023-11-04 NOTE — Telephone Encounter (Signed)
 Pt would like to know if he should still do pulmonary rehab and if he should change his exercise routine? advice

## 2023-11-04 NOTE — Telephone Encounter (Signed)
 He should do pulmonary rehab, no changes.

## 2023-11-04 NOTE — Patient Instructions (Addendum)
 ICD-10-CM   1. DOE (dyspnea on exertion)  R06.09     2. 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HCC)  J84.112     3. History of 2019 novel coronavirus disease (COVID-19)  Z86.16       Do d-dimer 11/04/2023 or 11/05/23 -0 if abnormal then need CTA -> if not do HRCT in 6 week Check ESR - if No PE and ESR hgh - consider sterods  Continue other medications and oxygen   Do PFT in 6 weeks  Followup - 6 weeks already scheduled 12/17/23

## 2023-11-04 NOTE — Telephone Encounter (Signed)
-----   Message from Roberts C sent at 11/04/2023  9:24 AM EDT ----- Regarding: Pt called out Hey,  Pt wife Washington called and stated pt will not be able to PR today due to heart issues and having an appt today.  Thanks, Camilo Cella

## 2023-11-04 NOTE — Patient Instructions (Addendum)
 Medication Changes:  INCREASE Metoprolol  25mg  every morning and 50mg  every evening.   Testing/Procedures:  Your provider has recommended that  you wear a Zio Patch for 14 days.  This monitor will record your heart rhythm for our review.  IF you have any symptoms while wearing the monitor please press the button.  If you have any issues with the patch or you notice a red or orange light on it please call the company at [phone].  Once you remove the patch please mail it back to the company as soon as possible so we can get the results.   Follow-Up in: Please follow up with the [facility] in 6 weeks with Dr. Mclean.   At the [facility], you and your health needs are our priority. We have a designated team specialized in the treatment of Heart Failure. This Care Team includes your primary Heart Failure Specialized Cardiologist (physician), Advanced Practice Providers (APPs- Physician Assistants and Nurse Practitioners), and Pharmacist who all work together to provide you with the care you need, when you need it.   You may see any of the following providers on your designated Care Team at your next follow up:  Dr. Jules Oar Dr. Peder Bourdon Dr. Alwin Baars Dr. Judyth Nunnery Shawnee Dellen, FNP Bevely Brush, RPH-CPP  Please be sure to bring in all your medications bottles to every appointment.   Need to Contact Us :  If you have any questions or concerns before your next appointment please send us  a message through [facility] or call our office at [phone].    TO LEAVE A MESSAGE FOR THE NURSE SELECT OPTION 2, PLEASE LEAVE A MESSAGE INCLUDING: YOUR NAME DATE OF BIRTH CALL BACK NUMBER REASON FOR CALL**this is important as we prioritize the call backs  YOU WILL RECEIVE A CALL BACK THE SAME DAY AS LONG AS YOU CALL BEFORE 4:00 PM

## 2023-11-04 NOTE — Progress Notes (Signed)
 PCP: Aldo Hun, MD Pulmonology: Dr. Bertrum Brodie Cardiology: Dr. Mitzie Anda  Chief complaint: Irregular heart rate  83 y.o. with history of pulmonary fibrosis, high grade B cell lymphoma, and prostate cancer was referred by Dr. Bertrum Brodie for evaluation of dyspnea and tachycardia.  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  He is on Esbriet .  He has not used home oxygen .  He has baseline exertional dyspnea.  In 5/22, he had XRT for prostate cancer.  In 9/22, he was diagnosed with high grade B cell lymphoma and finished his 6th cycle of mini-CHOP in 4/23.  Pre-op echo in 9/22 showed EF 65-70%, normal RV, unable to estimate PASP, mild AS mean gradient 12 mmHg.  Patient has been noted by CT to have coronary calcification.   After completing CHOP in 4/23, patient developed persistent sinus tachycardia.  He felt palpitations.  D dimer was normal.  No evidence for infection.  Echo in 4/23 showed EF 55-60%, normal RV, unable to assess PA pressure (no TR jet), and nodular calcification of the mitral valve.  Due to mitral valve abnormality, blood cultures were done which were negative.  Sinus tachycardia eventually subsided on its own.   There was talk over doing CTA to further investigate etiology of dyspnea. Felt arranging LHC at time of RHC would make most sense.   Underwent [facility] 10/23 showing mild LAD lesion 40% stenosed, normal filling pressures, normal PA pressure, normal CO.  S/p cervical spinal decompression & fusion 08/11/22. Seen in ED 08/23/22 with palpitations. 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 Zio monitor in 3/24 showed 16% PVCs.  Toprol  XL increased to 25 mg bid.    Echo 3/25 showed EF 60-65%, normal RV, aortic sclerosis without stenosis, ascending aorta 4.0 cm. Patient had a COVID 19 episode in 3/25 requiring hospitalization.   Patient presents for ER followup.  He was seen in the ER  last night for irregular heart rate and general malaise.  He was noted to have frequent PVCs and PACs.  He felt palpitations.  He has been doing pulmonary rehab and has been noted more shortness of breath and fatigue, using 2L home oxygen  most of the time now.  Workup in the ER showed negative HS-TnI and normal pro-BNP. COVID, influenza, and RSV were negative.  On-call cardiology recommended increasing Toprol  XL to 25 qam/50 qpm with the increased ectopy.  Today, he says he feels better, not feeling palpitations.  His ECG shows frequent PACs.  No lightheadedness or syncope.   ECG (personally reviewed): NSR with PACs  Labs (4/23): D dimer negative, BNP 137, HS-TnI 13, K 4.1, creatinine 0.59, hgb 10.2, WBCs 12 Labs (5/23): K 4.4, creatinine 0.64, hgb 11.3 Labs (2/24): TSH and LFTs normal, K 4.0, creatinine 0.59, hgb 11.3, HS-Tnl 7>>7. Labs (4/24): K 3.9, creatinine 0.66 Labs (5/24): LDL 68 Labs (11/24): K 4, creatinine 0.66 Labs (3/25): K 4.5, creatinine 0.5, hgb 12.7, TSH normal Labs (4/25): TnI negative, pro-BNP normal, K 4.4, creatinine 0.68  PMH: 1. IBS 2. 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  - CT chest in 9/22 with pulmonary fibrosis, enlarged PA, coronary artery calcium .  3. CAD: Coronary calcification on 9/22 CT chest.  - Cardiolite in 9/18 showed no ischemia or infarction.  - LHC (10/23): mild nonobstructive CAD with 40% mid LAD lesion 4. Hyperlipidemia 5. High grade B cell lymphoma: Diagnosed in 9/22.  He received 6 cycles of mini-CHOP.  -  Echo (9/22): Prechemo, EF 65-70%, normal RV, unable to estimate PASP, mild AS mean gradient 12 mmHg.  - Echo (4/23): EF 55-60%, normal RV, unable to assess PA pressure (no TR jet), and nodular calcification of the mitral valve.  - RHC (10/23): RA 2, PA 24/5 (mean 14), PCWP 5, CO/CI 6.26/3.29 - Echo (3/24): EF 55-60%, normal RV, mild-moderate AS with mean gradient 11 and AVA 1.52 cm^2.  - Echo (3/25): EF 60-65%, normal RV, aortic  sclerosis without stenosis, ascending aorta 4.0 cm.  6. Aortic stenosis: Mild-moderate on 3/24 echo but minimal on 3/25 echo.  7. 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  8. H/o L-spine compression fracture.  9. Sleep study 5/24 with mild OSA.  10. PVCs: Zio monitor (3/24) with 16% PVCs  SH: Married, lives in [place], retired [employer] scientist, nonsmoker, rare ETOH.   Family History  Problem Relation Age of Onset   Heart failure Father 28   Bladder Cancer Mother    Breast cancer Mother    Colon cancer Neg Hx    Pancreatic cancer Neg Hx    Rectal cancer Neg Hx    Stomach cancer Neg Hx    Prostate cancer Neg Hx    ROS: All systems reviewed and negative except as per HPI.   Current Outpatient Medications  Medication Sig Dispense Refill   acetaminophen  (TYLENOL ) 500 MG tablet Take 1,000 mg by mouth every 6 (six) hours as needed for moderate pain.     acetaminophen -codeine  (TYLENOL  #3) 300-30 MG tablet Take 1 tablet by mouth every 4 (four) hours as needed (for pain).     albuterol  (VENTOLIN  HFA) 108 (90 Base) MCG/ACT inhaler Inhale 2 puffs into the lungs every 6 (six) hours as needed for wheezing or shortness of breath. 6.7 g 2   aspirin  81 MG chewable tablet Chew 81 mg by mouth 3 (three) times a week.     carboxymethylcellulose (REFRESH TEARS) 0.5 % SOLN Place 1 drop into both eyes daily as needed (dry eyes).     carisoprodol  (SOMA ) 250 MG tablet Take 175-350 mg by mouth 3 (three) times daily as needed (for muscle spasms).     Cholecalciferol  (VITAMIN D -3) 1000 UNITS CAPS Take 2,000 Units by mouth every evening.     CO-ENZYME Q-10 PO Take 1 tablet by mouth every evening.      Cyanocobalamin  (VITAMIN B12) 1000 MCG TBCR Take 1,000 mcg by mouth daily.     denosumab  (PROLIA ) 60 MG/ML SOSY injection Inject 60 mg into the skin every 6 (six) months. At [facility]     desvenlafaxine (PRISTIQ) 50 MG 24 hr tablet Take 50 mg by mouth daily.     diclofenac  (VOLTAREN ) 75 MG EC tablet Take 75 mg by  mouth 2 (two) times daily as needed (pain.).  2   diclofenac  sodium (VOLTAREN ) 1 % GEL Apply 4 g topically 4 (four) times daily as needed (pain.).  12   dicyclomine  (BENTYL ) 10 MG capsule Take one by mouth every 6 hours as needed for abdominal cramping 30 capsule 1   diphenoxylate-atropine (LOMOTIL) 2.5-0.025 MG tablet Take 1 tablet by mouth as needed for diarrhea or loose stools.     Emollient (AQUAPHOR ADV PROTECT HEALING EX) Apply 1 Application topically daily as needed (itching).     Evolocumab (REPATHA) 140 MG/ML SOSY Inject 140 mg into the skin every 30 (thirty) days.     Glucosamine Sulfate 500 MG TABS Take 500 mg by mouth daily.     HYDROcodone -acetaminophen  (NORCO) 10-325  MG tablet Take 1 tablet by mouth See admin instructions. Take 1 tablet by mouth every 4-6 hours as needed for pain     latanoprost  (XALATAN ) 0.005 % ophthalmic solution Place 1 drop into both eyes at bedtime.     loratadine  (CLARITIN ) 10 MG tablet Take 10 mg by mouth daily.     meclizine  (ANTIVERT ) 25 MG tablet Take 25 mg by mouth 3 (three) times daily as needed for dizziness.     Multiple Vitamin (MULTIVITAMIN) tablet Take 1 tablet by mouth daily with breakfast.     omeprazole  (PRILOSEC) 40 MG capsule TAKE 1 CAPSULE (40 MG TOTAL) BY MOUTH DAILY. 90 capsule 0   ondansetron  (ZOFRAN ) 8 MG tablet Take 1 tablet (8 mg total) by mouth every 8 (eight) hours as needed for nausea or vomiting. 30 tablet 1   Pirfenidone  (ESBRIET ) 801 MG TABS Take 1 tablet (801 mg total) by mouth 3 (three) times daily with meals. 270 tablet 1   Probiotic Product (ALIGN) 4 MG CAPS Take 4 mg by mouth daily.     SODIUM FLUORIDE 5000 PLUS 1.1 % CREA dental cream Place 1 Application onto teeth at bedtime.     XHANCE  93 MCG/ACT EXHU Place 1 spray into both nostrils 2 (two) times daily as needed (for sinus issues).     metoprolol  succinate (TOPROL  XL) 25 MG 24 hr tablet Take 1 tablet (25 mg total) by mouth every morning AND 2 tablets (50 mg total) every  evening. 90 tablet 11   No current facility-administered medications for this visit.   Wt Readings from Last 3 Encounters:  11/04/23 166 lb (75.3 kg)  11/04/23 163 lb 8 oz (74.2 kg)  11/02/23 168 lb 3.4 oz (76.3 kg)   BP (!) 123/46 (BP Location: Right Arm, Patient Position: Sitting, Cuff Size: Normal)   Pulse (!) 47   Wt 163 lb 8 oz (74.2 kg)   SpO2 97%   BMI 24.14 kg/m  General: NAD Neck: No JVD, no thyromegaly or thyroid  nodule.  Lungs: Dry crackles at bases. . CV: Nondisplaced PMI.  Heart regular S1/S2, no S3/S4, 1/6 SEM RUSB.  No peripheral edema.  No carotid bruit.  Normal pedal pulses.  Abdomen: Soft, nontender, no hepatosplenomegaly, no distention.  Skin: Intact without lesions or rashes.  Neurologic: Alert and oriented x 3.  Psych: Normal affect. Extremities: No clubbing or cyanosis.  HEENT: Normal.   Assessment/Plan: 1. PVCs/PACs: Zio monitor in 3/24 with 16% PVCs.  Toprol  XL was increased.  Echo in 3/24 and 3/25 showed normal EF.  Cath in 10/23 showed nonobstructive CAD.  Frequent PACs and PVCs noted at yesterday's ER visit.  Today, ECG shows frequent PACs. No lightheadedness or syncope.  Recent TSH normal.  - Continue Toprol  XL 25 mg qam/50 mg qpm (increased dose).  - I will arrange for 2 week Zio monitor, ?episodes of atrial fibrillation.  - Mild OSA on sleep study, he does not want to use CPAP.  2. Exertional dyspnea: Suspect this is primarily due to IPF.  Echo in 3/24 showed EF 55-60%, normal RV, mild-moderate AS with mean gradient 11 and AVA 1.52 cm^2.  RHC (10/23) with normal filling pressures, normal PA pressure and normal CO. No indication for Tyvaso based on RHC, but he is in a clinical trial of Tyvaso vs placebo in IPF without PH. Echo in 3/25 showed EF 60-65%, normal RV, aortic sclerosis without stenosis, ascending aorta 4.0 cm. He is not volume overloaded on exam and pro-BNP yesterday was  normal.  - Suspect progressive IPF is the main contributor to dyspnea.  4.  IPF: Long-standing, has been on Esbriet .  5. CAD: Coronary calcification noted. LHC (10/23) with mild, non-obstructive CAD; mild LAD 40% stenosed. No chest pain. - Continue ASA 81.  - Continue Repatha.  6. Aortic stenosis: Mild to moderate on 3/24 echo.  However, minimal on 3/25 echo.   Follow up 2-3 months unless worrisome abnormalities noted on Zio monitor.   I spent 31 minutes reviewing records, interviewing/examining patient, and managing orders.    Kenneth [name]  11/04/2023

## 2023-11-05 DIAGNOSIS — R0609 Other forms of dyspnea: Secondary | ICD-10-CM | POA: Diagnosis not present

## 2023-11-05 DIAGNOSIS — Z8616 Personal history of COVID-19: Secondary | ICD-10-CM | POA: Diagnosis not present

## 2023-11-05 DIAGNOSIS — J84112 Idiopathic pulmonary fibrosis: Secondary | ICD-10-CM | POA: Diagnosis not present

## 2023-11-05 LAB — SEDIMENTATION RATE: Sed Rate: 1 mm/h (ref 0–20)

## 2023-11-05 LAB — D-DIMER, QUANTITATIVE: D-Dimer, Quant: 0.65 ug{FEU}/mL — ABNORMAL HIGH (ref <0.50)

## 2023-11-05 NOTE — Telephone Encounter (Signed)
 Spoke to pt to relay message from dr. Mclean. Pt verbalized understanding and is agreeable to plan.

## 2023-11-08 ENCOUNTER — Encounter (HOSPITAL_COMMUNITY)

## 2023-11-08 ENCOUNTER — Telehealth: Payer: Self-pay | Admitting: Internal Medicine

## 2023-11-08 DIAGNOSIS — J84112 Idiopathic pulmonary fibrosis: Secondary | ICD-10-CM

## 2023-11-08 DIAGNOSIS — R7989 Other specified abnormal findings of blood chemistry: Secondary | ICD-10-CM

## 2023-11-08 DIAGNOSIS — R0609 Other forms of dyspnea: Secondary | ICD-10-CM

## 2023-11-08 DIAGNOSIS — Z8616 Personal history of COVID-19: Secondary | ICD-10-CM

## 2023-11-08 NOTE — Telephone Encounter (Signed)
 I called and spoke with the pt and notified of results/recs per MR  He verbalized understanding  Orders are already placed  Nothing further needed

## 2023-11-08 NOTE — Telephone Encounter (Signed)
   D-dimr just high  Plan  0 get a VQ scanthis week (trying to avoid CT with contrast) as fifrst step - pls double ceck my order

## 2023-11-09 ENCOUNTER — Encounter (HOSPITAL_COMMUNITY)
Admission: RE | Admit: 2023-11-09 | Discharge: 2023-11-09 | Disposition: A | Discharge status: Home / Self Care | Source: Ambulatory Visit | Attending: Pulmonary Disease | Admitting: Pulmonary Disease

## 2023-11-09 DIAGNOSIS — J84112 Idiopathic pulmonary fibrosis: Secondary | ICD-10-CM | POA: Diagnosis not present

## 2023-11-09 NOTE — Progress Notes (Signed)
 Daily Session Note  Patient Details  Name: Kenneth [name] MRN: [id number] Date of Birth: 1940-07-20 Referring Provider:   [name] Pulmonary Rehab Walk Test from 10/01/2023 in [facility]  Referring Provider [name]  [[name]]       Encounter Date: 11/09/2023  Check In:  Session Check In - 11/09/23 0957       Check-In   Supervising physician immediately available to respond to emergencies [facility] MD immediately available    Physician(s) Slater Duncan, NP    Location [facility]    Staff Present Atlas Lea, MS, ACSM-CEP, Exercise Physiologist;Catera Hankins Rochelle Chu, ACSM-CEP, Exercise Physiologist;Casey Carmen Chol, RN, BSN;Johnny Porter, MS, Exercise Physiologist    Virtual Visit No    Medication changes reported     No    Fall or balance concerns reported    No    Tobacco Cessation No Change    Warm-up and Cool-down Performed as group-led instruction    Resistance Training Performed Yes    VAD Patient? No    PAD/SET Patient? No      Pain Assessment   Currently in Pain? No/denies    Pain Score 0-No pain    Multiple Pain Sites No             Capillary Blood Glucose: No results found for this or any previous visit (from the past 24 hours).    Social History   Tobacco Use  Smoking Status Never  Smokeless Tobacco Never    Goals Met:  Independence with exercise equipment Exercise tolerated well No report of concerns or symptoms today Strength training completed today  Goals Unmet:  Not Applicable  Comments: Service time is from 1010 to 1139.    Dr. Genetta Kenning is Medical Director for Pulmonary Rehab at [facility].

## 2023-11-10 ENCOUNTER — Telehealth: Payer: Self-pay | Admitting: Internal Medicine

## 2023-11-10 NOTE — Telephone Encounter (Signed)
 Called to get patient scheduled for Pft. He said that he was also in need of a lung scan. Unsure of what type of scan is needed. Please call and advise.

## 2023-11-11 ENCOUNTER — Encounter (HOSPITAL_COMMUNITY)
Admission: RE | Admit: 2023-11-11 | Discharge: 2023-11-11 | Disposition: A | Discharge status: Home / Self Care | Source: Ambulatory Visit | Attending: Pulmonary Disease | Admitting: Pulmonary Disease

## 2023-11-11 DIAGNOSIS — J84112 Idiopathic pulmonary fibrosis: Secondary | ICD-10-CM | POA: Diagnosis not present

## 2023-11-11 NOTE — Progress Notes (Signed)
 Daily Session Note  Patient Details  Name: Kenneth [name] MRN: [id number] Date of Birth: [birth year] Referring Provider:   [name] Pulmonary Rehab Walk Test from 10/01/2023 in [facility]  Referring Provider [name]  [[name]]       Encounter Date: 11/11/2023  Check In:  Session Check In - 11/11/23 1109       Check-In   Supervising physician immediately available to respond to emergencies [facility] MD immediately available    Physician(s) Levin Reamer, NP    Location [facility]    Staff Present Atlas Lea, MS, ACSM-CEP, Exercise Physiologist;Latacha Texeira Rochelle Chu, ACSM-CEP, Exercise Physiologist;Casey Felipe Horton, Graig Lawyer, RN, Malvin Searing, MS, ACSM-CEP, CCRP, Exercise Physiologist    Virtual Visit No    Medication changes reported     No    Fall or balance concerns reported    No    Tobacco Cessation No Change    Warm-up and Cool-down Performed as group-led instruction    Resistance Training Performed Yes    VAD Patient? No    PAD/SET Patient? No      Pain Assessment   Currently in Pain? No/denies    Multiple Pain Sites No             Capillary Blood Glucose: No results found for this or any previous visit (from the past 24 hours).    Social History   Tobacco Use  Smoking Status Never  Smokeless Tobacco Never    Goals Met:  Independence with exercise equipment Exercise tolerated well No report of concerns or symptoms today Strength training completed today  Goals Unmet:  Not Applicable  Comments: Service time is from 1008 to 1141.    Dr. Genetta Kenning is Medical Director for Pulmonary Rehab at [facility].

## 2023-11-11 NOTE — Telephone Encounter (Signed)
 Pt is referring to scheduling his perfusion scan, which was already ordered  Routing to [facility] to get this set up

## 2023-11-16 ENCOUNTER — Encounter (HOSPITAL_COMMUNITY)
Admission: RE | Admit: 2023-11-16 | Discharge: 2023-11-16 | Disposition: A | Discharge status: Home / Self Care | Source: Ambulatory Visit | Attending: Pulmonary Disease | Admitting: Pulmonary Disease

## 2023-11-16 VITALS — Wt 166.4 lb

## 2023-11-16 DIAGNOSIS — I2510 Atherosclerotic heart disease of native coronary artery without angina pectoris: Secondary | ICD-10-CM | POA: Diagnosis not present

## 2023-11-16 DIAGNOSIS — M810 Age-related osteoporosis without current pathological fracture: Secondary | ICD-10-CM | POA: Diagnosis not present

## 2023-11-16 DIAGNOSIS — E871 Hypo-osmolality and hyponatremia: Secondary | ICD-10-CM | POA: Diagnosis not present

## 2023-11-16 DIAGNOSIS — D649 Anemia, unspecified: Secondary | ICD-10-CM | POA: Diagnosis not present

## 2023-11-16 DIAGNOSIS — J84112 Idiopathic pulmonary fibrosis: Secondary | ICD-10-CM | POA: Diagnosis not present

## 2023-11-16 DIAGNOSIS — F32A Depression, unspecified: Secondary | ICD-10-CM | POA: Diagnosis not present

## 2023-11-16 DIAGNOSIS — I272 Pulmonary hypertension, unspecified: Secondary | ICD-10-CM | POA: Diagnosis not present

## 2023-11-16 DIAGNOSIS — R7301 Impaired fasting glucose: Secondary | ICD-10-CM | POA: Diagnosis not present

## 2023-11-16 DIAGNOSIS — I5033 Acute on chronic diastolic (congestive) heart failure: Secondary | ICD-10-CM | POA: Diagnosis not present

## 2023-11-16 DIAGNOSIS — C8510 Unspecified B-cell lymphoma, unspecified site: Secondary | ICD-10-CM | POA: Diagnosis not present

## 2023-11-16 DIAGNOSIS — I493 Ventricular premature depolarization: Secondary | ICD-10-CM | POA: Diagnosis not present

## 2023-11-16 DIAGNOSIS — J9601 Acute respiratory failure with hypoxia: Secondary | ICD-10-CM | POA: Diagnosis not present

## 2023-11-16 NOTE — Progress Notes (Signed)
 Daily Session Note  Patient Details  Name: Kenneth [name] MRN: [id number] Date of Birth: 13-Nov-1940 Referring Provider:   [name] Pulmonary Rehab Walk Test from 10/01/2023 in [facility]  Referring Provider [name]  [[name]]       Encounter Date: 11/16/2023  Check In:  Session Check In - 11/16/23 1118       Check-In   Supervising physician immediately available to respond to emergencies [facility] MD immediately available    Physician(s) Levin Reamer, NP    Location [facility]    Staff Present Atlas Lea, MS, ACSM-CEP, Exercise Physiologist;Randi Rochelle Chu, ACSM-CEP, Exercise Physiologist;Casey Felipe Horton, Graig Lawyer, RN, BSN;Samantha Belarus, RD, Evalyn Hillier, RN, BSN    Virtual Visit No    Medication changes reported     No    Fall or balance concerns reported    No    Tobacco Cessation No Change    Warm-up and Cool-down Performed as group-led Writer Performed Yes    VAD Patient? No    PAD/SET Patient? No      Pain Assessment   Currently in Pain? No/denies             Capillary Blood Glucose: No results found for this or any previous visit (from the past 24 hours).   Exercise Prescription Changes - 11/16/23 1100       Response to Exercise   Blood Pressure (Admit) 122/72    Blood Pressure (Exercise) 110/56    Blood Pressure (Exit) 116/56    Heart Rate (Admit) 64 bpm    Heart Rate (Exercise) 113 bpm    Heart Rate (Exit) 89 bpm    Oxygen  Saturation (Admit) 93 %   2L   Oxygen  Saturation (Exercise) 89 %   4L   Oxygen  Saturation (Exit) 93 %   RA   Rating of Perceived Exertion (Exercise) 13    Perceived Dyspnea (Exercise) 2    Duration Continue with 30 min of aerobic exercise without signs/symptoms of physical distress.    Intensity THRR unchanged      Progression   Progression Continue to progress workloads to maintain intensity without  signs/symptoms of physical distress.      Resistance Training   Training Prescription Yes    Weight blue bands    Reps 10-15    Time 10 Minutes      Oxygen    Oxygen  Continuous    Liters 2-4   2-4     Treadmill   MPH 2    Grade 1    Minutes 15    METs 2.6      Bike   Level 3    Minutes 15    METs 3      Oxygen    Maintain Oxygen  Saturation 88% or higher             Social History   Tobacco Use  Smoking Status Never  Smokeless Tobacco Never    Goals Met:  Independence with exercise equipment Exercise tolerated well No report of concerns or symptoms today Strength training completed today  Goals Unmet:  Not Applicable  Comments: Service time is from 1006 to 1123    Dr. Genetta Kenning is Medical Director for Pulmonary Rehab at [facility].

## 2023-11-17 ENCOUNTER — Ambulatory Visit (HOSPITAL_COMMUNITY)
Admission: RE | Admit: 2023-11-17 | Discharge: 2023-11-17 | Disposition: A | Discharge status: Home / Self Care | Source: Ambulatory Visit | Attending: Internal Medicine | Admitting: Internal Medicine

## 2023-11-17 DIAGNOSIS — J84112 Idiopathic pulmonary fibrosis: Secondary | ICD-10-CM | POA: Insufficient documentation

## 2023-11-17 DIAGNOSIS — J8410 Pulmonary fibrosis, unspecified: Secondary | ICD-10-CM | POA: Diagnosis not present

## 2023-11-17 DIAGNOSIS — I517 Cardiomegaly: Secondary | ICD-10-CM | POA: Diagnosis not present

## 2023-11-17 DIAGNOSIS — R0600 Dyspnea, unspecified: Secondary | ICD-10-CM | POA: Diagnosis not present

## 2023-11-17 DIAGNOSIS — R0609 Other forms of dyspnea: Secondary | ICD-10-CM | POA: Diagnosis not present

## 2023-11-17 DIAGNOSIS — R7989 Other specified abnormal findings of blood chemistry: Secondary | ICD-10-CM | POA: Diagnosis not present

## 2023-11-17 DIAGNOSIS — Z8616 Personal history of COVID-19: Secondary | ICD-10-CM

## 2023-11-17 MED ORDER — TECHNETIUM TO 99M ALBUMIN AGGREGATED
3.9000 | Freq: Once | INTRAVENOUS | Status: AC
  Administered 2023-11-17: 3.9 via INTRAVENOUS

## 2023-11-17 NOTE — Progress Notes (Signed)
 Pulmonary Individual Treatment Plan  Patient Details  Name: Kenneth [name] MRN: [id number] Date of Birth: 12/22/1940 Referring Provider:   [name] Pulmonary Rehab Walk Test from 10/01/2023 in [facility]  Referring Provider [name]  [[name]]       Initial Encounter Date:  Flowsheet Row Pulmonary Rehab Walk Test from 10/01/2023 in [facility]  Date 10/01/23       Visit Diagnosis: 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HCC)  Patient's Home Medications on Admission:   Current Outpatient Medications:    acetaminophen  (TYLENOL ) 500 MG tablet, Take 1,000 mg by mouth every 6 (six) hours as needed for moderate pain., Disp: , Rfl:    acetaminophen -codeine  (TYLENOL  #3) 300-30 MG tablet, Take 1 tablet by mouth every 4 (four) hours as needed (for pain)., Disp: , Rfl:    albuterol  (VENTOLIN  HFA) 108 (90 Base) MCG/ACT inhaler, Inhale 2 puffs into the lungs every 6 (six) hours as needed for wheezing or shortness of breath., Disp: 6.7 g, Rfl: 2   aspirin  81 MG chewable tablet, Chew 81 mg by mouth 3 (three) times a week., Disp: , Rfl:    carboxymethylcellulose (REFRESH TEARS) 0.5 % SOLN, Place 1 drop into both eyes daily as needed (dry eyes)., Disp: , Rfl:    carisoprodol  (SOMA ) 250 MG tablet, Take 175-350 mg by mouth 3 (three) times daily as needed (for muscle spasms)., Disp: , Rfl:    Cholecalciferol  (VITAMIN D -3) 1000 UNITS CAPS, Take 2,000 Units by mouth every evening., Disp: , Rfl:    CO-ENZYME Q-10 PO, Take 1 tablet by mouth every evening. , Disp: , Rfl:    Cyanocobalamin  (VITAMIN B12) 1000 MCG TBCR, Take 1,000 mcg by mouth daily., Disp: , Rfl:    denosumab  (PROLIA ) 60 MG/ML SOSY injection, Inject 60 mg into the skin every 6 (six) months. At [facility], Disp: , Rfl:    desvenlafaxine (PRISTIQ) 50 MG 24 hr tablet, Take 50 mg by mouth daily., Disp: , Rfl:    diclofenac   (VOLTAREN ) 75 MG EC tablet, Take 75 mg by mouth 2 (two) times daily as needed (pain.)., Disp: , Rfl: 2   diclofenac  sodium (VOLTAREN ) 1 % GEL, Apply 4 g topically 4 (four) times daily as needed (pain.)., Disp: , Rfl: 12   dicyclomine  (BENTYL ) 10 MG capsule, Take one by mouth every 6 hours as needed for abdominal cramping, Disp: 30 capsule, Rfl: 1   diphenoxylate-atropine (LOMOTIL) 2.5-0.025 MG tablet, Take 1 tablet by mouth as needed for diarrhea or loose stools., Disp: , Rfl:    Emollient (AQUAPHOR ADV PROTECT HEALING EX), Apply 1 Application topically daily as needed (itching)., Disp: , Rfl:    Evolocumab (REPATHA) 140 MG/ML SOSY, Inject 140 mg into the skin every 30 (thirty) days., Disp: , Rfl:    Glucosamine Sulfate 500 MG TABS, Take 500 mg by mouth daily., Disp: , Rfl:    HYDROcodone -acetaminophen  (NORCO) 10-325 MG tablet, Take 1 tablet by mouth See admin instructions. Take 1 tablet by mouth every 4-6 hours as needed for pain, Disp: , Rfl:    latanoprost  (XALATAN ) 0.005 % ophthalmic solution, Place 1 drop into both eyes at bedtime., Disp: , Rfl:    loratadine  (CLARITIN ) 10 MG tablet, Take 10 mg by mouth daily., Disp: , Rfl:    meclizine  (ANTIVERT ) 25 MG tablet, Take 25 mg by mouth 3 (three) times daily as needed for dizziness., Disp: ,  Rfl:    metoprolol  succinate (TOPROL  XL) 25 MG 24 hr tablet, Take 1 tablet (25 mg total) by mouth every morning AND 2 tablets (50 mg total) every evening., Disp: 90 tablet, Rfl: 11   Multiple Vitamin (MULTIVITAMIN) tablet, Take 1 tablet by mouth daily with breakfast., Disp: , Rfl:    omeprazole  (PRILOSEC) 40 MG capsule, TAKE 1 CAPSULE (40 MG TOTAL) BY MOUTH DAILY., Disp: 90 capsule, Rfl: 0   ondansetron  (ZOFRAN ) 8 MG tablet, Take 1 tablet (8 mg total) by mouth every 8 (eight) hours as needed for nausea or vomiting., Disp: 30 tablet, Rfl: 1   Pirfenidone  (ESBRIET ) 801 MG TABS, Take 1 tablet (801 mg total) by mouth 3 (three) times daily with meals., Disp: 270 tablet,  Rfl: 1   Probiotic Product (ALIGN) 4 MG CAPS, Take 4 mg by mouth daily., Disp: , Rfl:    SODIUM FLUORIDE 5000 PLUS 1.1 % CREA dental cream, Place 1 Application onto teeth at bedtime., Disp: , Rfl:    XHANCE  93 MCG/ACT EXHU, Place 1 spray into both nostrils 2 (two) times daily as needed (for sinus issues)., Disp: , Rfl:   Past Medical History: Past Medical History:  Diagnosis Date   Arthritis    Depression    Diffuse large B cell lymphoma (HCC) 2023   Diverticulosis of colon (without mention of hemorrhage)    GERD (gastroesophageal reflux disease)    Hiatal hernia    HLD (hyperlipidemia)    Internal hemorrhoids without mention of complication    Irritable bowel syndrome    Other specified gastritis without mention of hemorrhage    Prostate cancer (HCC) 2022   Pulmonary fibrosis (HCC)    Stress fracture 02/2021    Tobacco Use: Social History   Tobacco Use  Smoking Status Never  Smokeless Tobacco Never    Labs: Review Flowsheet       Latest Ref Rng & Units 04/14/2022 12/01/2022 08/22/2023  Labs for ITP Cardiac and Pulmonary Rehab  Cholestrol 0 - 200 mg/dL - 098  -  LDL (calc) 0 - 99 mg/dL - 68  -  HDL-C >11 mg/dL - 50  -  Trlycerides <914 mg/dL - 782  -  Bicarbonate 95.6 - 28.0 mmol/L 26.3  25.1  - 23.6   TCO2 22 - 32 mmol/L 28  26  - 25   O2 Saturation % 76  75  - 97     Details       Multiple values from one day are sorted in reverse-chronological order         Capillary Blood Glucose: Lab Results  Component Value Date   GLUCAP 119 (H) 09/29/2021   GLUCAP 118 (H) 05/26/2021     Pulmonary Assessment Scores:  Pulmonary Assessment Scores     Row Name 10/01/23 0940         ADL UCSD   ADL Phase Entry     SOB Score total 47       CAT Score   CAT Score 12       mMRC Score   mMRC Score 1             UCSD: Self-administered rating of dyspnea associated with activities of daily living (ADLs) 6-point scale (0 = "not at all" to 5 = "maximal or  unable to do because of breathlessness")  Scoring Scores range from 0 to 120.  Minimally important difference is 5 units  CAT: CAT can identify the health impairment of COPD patients  and is better correlated with disease progression.  CAT has a scoring range of zero to 40. The CAT score is classified into four groups of low (less than 10), medium (10 - 20), high (21-30) and very high (31-40) based on the impact level of disease on health status. A CAT score over 10 suggests significant symptoms.  A worsening CAT score could be explained by an exacerbation, poor medication adherence, poor inhaler technique, or progression of COPD or comorbid conditions.  CAT MCID is 2 points  mMRC: mMRC (Modified Medical Research Council) Dyspnea Scale is used to assess the degree of baseline functional disability in patients of respiratory disease due to dyspnea. No minimal important difference is established. A decrease in score of 1 point or greater is considered a positive change.   Pulmonary Function Assessment:  Pulmonary Function Assessment - 10/01/23 1400       Breath   Bilateral Breath Sounds Decreased    Shortness of Breath Yes;Limiting activity             Exercise Target Goals: Exercise Program Goal: Individual exercise prescription set using results from initial 6 min walk test and THRR while considering  patient's activity barriers and safety.   Exercise Prescription Goal: Initial exercise prescription builds to 30-45 minutes a day of aerobic activity, 2-3 days per week.  Home exercise guidelines will be given to patient during program as part of exercise prescription that the participant will acknowledge.  Activity Barriers & Risk Stratification:  Activity Barriers & Cardiac Risk Stratification - 10/01/23 0928       Activity Barriers & Cardiac Risk Stratification   Activity Barriers Back Problems;Shortness of Breath;Deconditioning;Muscular Weakness             6 Minute  Walk:  6 Minute Walk     Row Name 10/01/23 1030         6 Minute Walk   Phase Initial     Distance 810 feet     Walk Time 6 minutes     # of Rest Breaks 2  1:41-2:00, 3:36-4:03     MPH 1.53     METS 1.87     RPE 13     Perceived Dyspnea  2     VO2 Peak 6.56     Symptoms No     Resting HR 110 bpm     Resting BP 122/64     Resting Oxygen  Saturation  91 %     Exercise Oxygen  Saturation  during 6 min walk 86 %     Max Ex. HR 122 bpm     Max Ex. BP 132/70     2 Minute Post BP 128/70       Interval HR   1 Minute HR 110     2 Minute HR 96     3 Minute HR 120     4 Minute HR 122     5 Minute HR 97     6 Minute HR 121     2 Minute Post HR 92     Interval Heart Rate? Yes       Interval Oxygen    Interval Oxygen ? Yes     Baseline Oxygen  Saturation % 91 %     1 Minute Oxygen  Saturation % 87 %     1 Minute Liters of Oxygen  0 L  increased to 1L     2 Minute Oxygen  Saturation % 86 %     2 Minute Liters of  Oxygen  1 L  increased to 2L     3 Minute Oxygen  Saturation % 88 %     3 Minute Liters of Oxygen  2 L  increased to 3L     4 Minute Oxygen  Saturation % 89 %     4 Minute Liters of Oxygen  3 L     5 Minute Oxygen  Saturation % 91 %     5 Minute Liters of Oxygen  3 L     6 Minute Oxygen  Saturation % 89 %     6 Minute Liters of Oxygen  3 L     2 Minute Post Oxygen  Saturation % 94 %     2 Minute Post Liters of Oxygen  3 L              Oxygen  Initial Assessment:  Oxygen  Initial Assessment - 10/01/23 0925       Home Oxygen    Home Oxygen  Device Portable Concentrator;Home Concentrator    Sleep Oxygen  Prescription Continuous    Liters per minute 2    Home Exercise Oxygen  Prescription Pulsed    Liters per minute 2    Home Resting Oxygen  Prescription None    Compliance with Home Oxygen  Use Yes      Initial 6 min Walk   Oxygen  Used Continuous    Liters per minute 2      Program Oxygen  Prescription   Program Oxygen  Prescription Continuous    Liters per minute 2       Intervention   Short Term Goals To learn and exhibit compliance with exercise, home and travel O2 prescription;To learn and understand importance of maintaining oxygen  saturations>88%;To learn and demonstrate proper use of respiratory medications;To learn and understand importance of monitoring SPO2 with pulse oximeter and demonstrate accurate use of the pulse oximeter.;To learn and demonstrate proper pursed lip breathing techniques or other breathing techniques.     Long  Term Goals Exhibits compliance with exercise, home  and travel O2 prescription;Verbalizes importance of monitoring SPO2 with pulse oximeter and return demonstration;Exhibits proper breathing techniques, such as pursed lip breathing or other method taught during program session;Maintenance of O2 saturations>88%;Compliance with respiratory medication;Demonstrates proper use of MDI's             Oxygen  Re-Evaluation:  Oxygen  Re-Evaluation     Row Name 10/13/23 0853 11/11/23 1612           Program Oxygen  Prescription   Program Oxygen  Prescription Continuous Continuous      Liters per minute 2 4        Home Oxygen    Home Oxygen  Device Portable Concentrator;Home Concentrator Portable Concentrator;Home Concentrator      Sleep Oxygen  Prescription Continuous Continuous      Liters per minute 2 2      Home Exercise Oxygen  Prescription Pulsed Pulsed      Liters per minute 2 2      Home Resting Oxygen  Prescription None None      Compliance with Home Oxygen  Use Yes Yes        Goals/Expected Outcomes   Short Term Goals To learn and exhibit compliance with exercise, home and travel O2 prescription;To learn and understand importance of maintaining oxygen  saturations>88%;To learn and demonstrate proper use of respiratory medications;To learn and understand importance of monitoring SPO2 with pulse oximeter and demonstrate accurate use of the pulse oximeter.;To learn and demonstrate proper pursed lip breathing techniques or other  breathing techniques.  To learn and exhibit compliance with exercise, home and travel O2 prescription;To  learn and understand importance of maintaining oxygen  saturations>88%;To learn and demonstrate proper use of respiratory medications;To learn and understand importance of monitoring SPO2 with pulse oximeter and demonstrate accurate use of the pulse oximeter.;To learn and demonstrate proper pursed lip breathing techniques or other breathing techniques.       Long  Term Goals Exhibits compliance with exercise, home  and travel O2 prescription;Verbalizes importance of monitoring SPO2 with pulse oximeter and return demonstration;Exhibits proper breathing techniques, such as pursed lip breathing or other method taught during program session;Maintenance of O2 saturations>88%;Compliance with respiratory medication;Demonstrates proper use of MDI's Exhibits compliance with exercise, home  and travel O2 prescription;Verbalizes importance of monitoring SPO2 with pulse oximeter and return demonstration;Exhibits proper breathing techniques, such as pursed lip breathing or other method taught during program session;Maintenance of O2 saturations>88%;Compliance with respiratory medication;Demonstrates proper use of MDI's      Goals/Expected Outcomes Compliance and understanding of oxygen  saturation monitoring and breathing techniques to decrease shortness of breath. Compliance and understanding of oxygen  saturation monitoring and breathing techniques to decrease shortness of breath.               Oxygen  Discharge (Final Oxygen  Re-Evaluation):  Oxygen  Re-Evaluation - 11/11/23 1612       Program Oxygen  Prescription   Program Oxygen  Prescription Continuous    Liters per minute 4      Home Oxygen    Home Oxygen  Device Portable Concentrator;Home Concentrator    Sleep Oxygen  Prescription Continuous    Liters per minute 2    Home Exercise Oxygen  Prescription Pulsed    Liters per minute 2    Home Resting Oxygen   Prescription None    Compliance with Home Oxygen  Use Yes      Goals/Expected Outcomes   Short Term Goals To learn and exhibit compliance with exercise, home and travel O2 prescription;To learn and understand importance of maintaining oxygen  saturations>88%;To learn and demonstrate proper use of respiratory medications;To learn and understand importance of monitoring SPO2 with pulse oximeter and demonstrate accurate use of the pulse oximeter.;To learn and demonstrate proper pursed lip breathing techniques or other breathing techniques.     Long  Term Goals Exhibits compliance with exercise, home  and travel O2 prescription;Verbalizes importance of monitoring SPO2 with pulse oximeter and return demonstration;Exhibits proper breathing techniques, such as pursed lip breathing or other method taught during program session;Maintenance of O2 saturations>88%;Compliance with respiratory medication;Demonstrates proper use of MDI's    Goals/Expected Outcomes Compliance and understanding of oxygen  saturation monitoring and breathing techniques to decrease shortness of breath.             Initial Exercise Prescription:  Initial Exercise Prescription - 10/01/23 1000       Date of Initial Exercise RX and Referring Provider   Date 10/01/23    Referring Provider Erika Haver   Expected Discharge Date 12/21/23      Oxygen    Oxygen  Continuous    Liters 3    Maintain Oxygen  Saturation 88% or higher      Treadmill   MPH 1.8    Grade 0    Minutes 15    METs 2      Bike   Level 1    Watts 20    Minutes 40    METs 1.5      Prescription Details   Frequency (times per week) 2    Duration Progress to 30 minutes of continuous aerobic without signs/symptoms of physical distress      Intensity  THRR 40-80% of Max Heartrate 56-135    Ratings of Perceived Exertion 11-13    Perceived Dyspnea 0-4      Progression   Progression Continue to progress workloads to maintain intensity without  signs/symptoms of physical distress.      Resistance Training   Training Prescription Yes    Weight blue bands    Reps 10-15             Perform Capillary Blood Glucose checks as needed.  Exercise Prescription Changes:   Exercise Prescription Changes     Row Name 10/05/23 1200 10/19/23 1100 11/02/23 1200 11/16/23 1100       Response to Exercise   Blood Pressure (Admit) 96/64 116/64 112/68 122/72    Blood Pressure (Exercise) 104/50 -- 120/60 110/56    Blood Pressure (Exit) 104/64 100/56 96/52 116/56    Heart Rate (Admit) 91 bpm 83 bpm 80 bpm 64 bpm    Heart Rate (Exercise) 107 bpm 105 bpm 112 bpm 113 bpm    Heart Rate (Exit) 83 bpm 92 bpm 79 bpm 89 bpm    Oxygen  Saturation (Admit) 99 % 99 % 96 % 93 %  2L    Oxygen  Saturation (Exercise) 95 % 87 % 94 % 89 %  4L    Oxygen  Saturation (Exit) 94 % 92 % 94 % 93 %  RA    Rating of Perceived Exertion (Exercise) 11 13 13 13     Perceived Dyspnea (Exercise) 1 2 2 2     Duration Continue with 30 min of aerobic exercise without signs/symptoms of physical distress. Continue with 30 min of aerobic exercise without signs/symptoms of physical distress. Continue with 30 min of aerobic exercise without signs/symptoms of physical distress. Continue with 30 min of aerobic exercise without signs/symptoms of physical distress.    Intensity THRR unchanged THRR unchanged THRR unchanged THRR unchanged      Progression   Progression Continue to progress workloads to maintain intensity without signs/symptoms of physical distress. Continue to progress workloads to maintain intensity without signs/symptoms of physical distress. Continue to progress workloads to maintain intensity without signs/symptoms of physical distress. Continue to progress workloads to maintain intensity without signs/symptoms of physical distress.      Resistance Training   Training Prescription Yes Yes Yes Yes    Weight blue bands blue bands blue bands blue bands    Reps 10-15 10-15  10-15 10-15    Time 10 Minutes 10 Minutes 10 Minutes 10 Minutes      Oxygen    Oxygen  Continuous Continuous Continuous Continuous    Liters 4 6 4-6 2-4  2-4      Treadmill   MPH 1.8 2 1.8 2    Grade 0.5 0 0 1    Minutes 15 15 15 15     METs 2.2 2.4 2.2 2.6      Bike   Level 2 2 2.5 3    Minutes 15 15 15 15     METs 2.8 2.8 3 3       Oxygen    Maintain Oxygen  Saturation 88% or higher 88% or higher -- 88% or higher             Exercise Comments:   Exercise Comments     Row Name 10/05/23 1209 11/02/23 1152         Exercise Comments Pt completed his first day of group exercise. He walked on the treadmill for 15 min. Initially 1.8 mph, 1.0 incline. However pt SpO2 86 2L  therefore decreased incline to 0 %. METs 2.5 to 2.1. He then exercised on the scibike for 15 min, level 2, METs 2.8. Tolerated well. Performed warm up and cool down without limitations, including squats. Discussed METs with good reception. Pt motivated. Completed home exercise plan. Kohlten is currently exercising at home but not consistently. I encouraged him to exercise 1 non-rehab day/wk for 30 min/day. He agreed with my recommendations. Coreon can walk outside or use his seated bike. Jatarius seems very motivated to exercise outside of rehab.               Exercise Goals and Review:   Exercise Goals     Row Name 10/01/23 [phone]             Exercise Goals   Increase Physical Activity Yes       Intervention Provide advice, education, support and counseling about physical activity/exercise needs.;Develop an individualized exercise prescription for aerobic and resistive training based on initial evaluation findings, risk stratification, comorbidities and participant's personal goals.       Expected Outcomes Short Term: Attend rehab on a regular basis to increase amount of physical activity.;Long Term: Add in home exercise to make exercise part of routine and to increase amount of physical activity.;Long Term:  Exercising regularly at least 3-5 days a week.       Increase Strength and Stamina Yes       Intervention Provide advice, education, support and counseling about physical activity/exercise needs.;Develop an individualized exercise prescription for aerobic and resistive training based on initial evaluation findings, risk stratification, comorbidities and participant's personal goals.       Expected Outcomes Short Term: Increase workloads from initial exercise prescription for resistance, speed, and METs.;Short Term: Perform resistance training exercises routinely during rehab and add in resistance training at home;Long Term: Improve cardiorespiratory fitness, muscular endurance and strength as measured by increased METs and functional capacity ( )       Able to understand and use rate of perceived exertion (RPE) scale Yes       Intervention Provide education and explanation on how to use RPE scale       Expected Outcomes Short Term: Able to use RPE daily in rehab to express subjective intensity level;Long Term:  Able to use RPE to guide intensity level when exercising independently       Able to understand and use Dyspnea scale Yes       Intervention Provide education and explanation on how to use Dyspnea scale       Expected Outcomes Short Term: Able to use Dyspnea scale daily in rehab to express subjective sense of shortness of breath during exertion;Long Term: Able to use Dyspnea scale to guide intensity level when exercising independently       Knowledge and understanding of Target Heart Rate Range (THRR) Yes       Intervention Provide education and explanation of THRR including how the numbers were predicted and where they are located for reference       Expected Outcomes Short Term: Able to state/look up THRR;Short Term: Able to use daily as guideline for intensity in rehab;Long Term: Able to use THRR to govern intensity when exercising independently       Understanding of Exercise Prescription  Yes       Intervention Provide education, explanation, and written materials on patient's individual exercise prescription       Expected Outcomes Short Term: Able to explain program exercise prescription;Long Term: Able to explain  home exercise prescription to exercise independently                Exercise Goals Re-Evaluation :  Exercise Goals Re-Evaluation     Row Name 10/13/23 0851 11/11/23 1610           Exercise Goal Re-Evaluation   Exercise Goals Review Increase Physical Activity;Able to understand and use Dyspnea scale;Understanding of Exercise Prescription;Increase Strength and Stamina;Knowledge and understanding of Target Heart Rate Range (THRR);Able to understand and use rate of perceived exertion (RPE) scale Increase Physical Activity;Able to understand and use Dyspnea scale;Understanding of Exercise Prescription;Increase Strength and Stamina;Knowledge and understanding of Target Heart Rate Range (THRR);Able to understand and use rate of perceived exertion (RPE) scale      Comments Shey has completed 3 exercise sessions. He exercises for 15 min on the treadmill and upright bike. He averages 2.2 METs at 1.8 mph on the treadmill and 2.8 METs at level 2 on the recumbent elliptical. Jenine Mix performs the warmup and cooldown standing without limitations. It is too soon to notate any discernable progressions. Will continue to monitor and progress as able. Xzavior has completed 11 exercise sessions. He exercises for 15 min on the treadmill and upright bike. He averages 2.4 METs at 2 mph on the treadmill and 2.8 METs at level 2.2 on the upright bike. Harison performs the warmup and cooldown standing without limitations. Tommye has increased his speed on the treadmill and level on the bike. He tolerates progressions well. Will continue to monitor and progress as able.      Expected Outcomes Through exercise at rehab and home, the patient will decrease shortness of breath with daily activities and feel  confident in carrying out an exercise regimen at home. Through exercise at rehab and home, the patient will decrease shortness of breath with daily activities and feel confident in carrying out an exercise regimen at home.               Discharge Exercise Prescription (Final Exercise Prescription Changes):  Exercise Prescription Changes - 11/16/23 1100       Response to Exercise   Blood Pressure (Admit) 122/72    Blood Pressure (Exercise) 110/56    Blood Pressure (Exit) 116/56    Heart Rate (Admit) 64 bpm    Heart Rate (Exercise) 113 bpm    Heart Rate (Exit) 89 bpm    Oxygen  Saturation (Admit) 93 %   2L   Oxygen  Saturation (Exercise) 89 %   4L   Oxygen  Saturation (Exit) 93 %   RA   Rating of Perceived Exertion (Exercise) 13    Perceived Dyspnea (Exercise) 2    Duration Continue with 30 min of aerobic exercise without signs/symptoms of physical distress.    Intensity THRR unchanged      Progression   Progression Continue to progress workloads to maintain intensity without signs/symptoms of physical distress.      Resistance Training   Training Prescription Yes    Weight blue bands    Reps 10-15    Time 10 Minutes      Oxygen    Oxygen  Continuous    Liters 2-4   2-4     Treadmill   MPH 2    Grade 1    Minutes 15    METs 2.6      Bike   Level 3    Minutes 15    METs 3      Oxygen    Maintain Oxygen  Saturation 88% or higher  Nutrition:  Target Goals: Understanding of nutrition guidelines, daily intake of sodium 1500mg , cholesterol 200mg , calories 30% from fat and 7% or less from saturated fats, daily to have 5 or more servings of fruits and vegetables.  Biometrics:    Nutrition Therapy Plan and Nutrition Goals:  Nutrition Therapy & Goals - 11/09/23 1102       Nutrition Therapy   Diet General Healthy Diet      Personal Nutrition Goals   Nutrition Goal Patient to maintain weight throughout pulmonary rehab and understand strategies for  weight maintenance/weight gain as needed.   goal not met.   Comments Goal not met. 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 Lipids are well controlled on repatha. He does report side effects of nausea, diarrhea, decreased appetitie on esbriet . He reports that weight has been stable over the last few years; will continue to monitor weight and appetitie. He is down 1.8# since starting with our program. He lives at [facility] and typically eats 1-2 meals on campus. He continues 1-2 nutrition supplements per day. We have discussed multiple strategies for weight gain/weight maintenance including increasing calorie density of foods, nutrition supplements, eating frequency, etc. He continues regular follow-up with cardiology and pulmonology; he is continues work-up for PE. Patient will benefit from participation in pulmonary rehab for nutrition, exercise, and lifestyle modification.      Intervention Plan   Intervention Prescribe, educate and counsel regarding individualized specific dietary modifications aiming towards targeted core components such as weight, hypertension, lipid management, diabetes, heart failure and other comorbidities.;Nutrition handout(s) given to patient.    Expected Outcomes Short Term Goal: Understand basic principles of dietary content, such as calories, fat, sodium, cholesterol and nutrients.;Long Term Goal: Adherence to prescribed nutrition plan.             Nutrition Assessments:  Nutrition Assessments - 10/05/23 1205       Rate Your Plate Scores   Pre Score 52            MEDIFICTS Score Key: >=70 Need to make dietary changes  40-70 Heart Healthy Diet <= 40 Therapeutic Level Cholesterol Diet  Flowsheet Row PULMONARY REHAB OTHER RESPIRATORY from 10/05/2023 in [facility]  Picture Your Plate Total Score on Admission 52      Picture Your Plate  Scores: <16 Unhealthy dietary pattern with much room for improvement. 41-50 Dietary pattern unlikely to meet recommendations for good health and room for improvement. 51-60 More healthful dietary pattern, with some room for improvement.  >60 Healthy dietary pattern, although there may be some specific behaviors that could be improved.    Nutrition Goals Re-Evaluation:  Nutrition Goals Re-Evaluation     Row Name 10/05/23 1155 11/09/23 1102           Goals   Current Weight 167 lb 8.8 oz (76 kg) 165 lb 12.6 oz (75.2 kg)      Comment triglycerides 172, LDL68 d-dimer 0.65; other most recent labs  triglycerides 172, LDL68      Expected Outcome Jayston has medical history of IPF, lymphoma, OSA, CAD, aortic stenosis, diastolic heart failure. Lipids are well controlled on repatha. He does report side effects of nausea, diarrhea, decreased appetitie on esbriet . He reports that weight has been stable over the last few years; will continue to monitor weight and appetitie. He lives at [facility] and typically eats 1-2 meals on campus. Patient will benefit from participation  in pulmonary rehab for nutrition, exercise, and lifestyle modification. Goal not met. Eastyn has medical history of IPF, lymphoma, OSA, CAD, aortic stenosis, diastolic heart failure. Lipids are well controlled on repatha. He does report side effects of nausea, diarrhea, decreased appetitie on esbriet . He reports that weight has been stable over the last few years; will continue to monitor weight and appetitie. He is down 1.8# since starting with our program. He lives at [facility] and typically eats 1-2 meals on campus. He continues 1-2 nutrition supplements per day. We have discussed multiple strategies for weight gain/weight maintenance including increasing calorie density of foods, nutrition supplements, eating frequency, etc. He continues regular follow-up with cardiology and pulmonology; he is continues work-up for PE. Patient will benefit  from participation in pulmonary rehab for nutrition, exercise, and lifestyle modification.               Nutrition Goals Discharge (Final Nutrition Goals Re-Evaluation):  Nutrition Goals Re-Evaluation - 11/09/23 1102       Goals   Current Weight 165 lb 12.6 oz (75.2 kg)    Comment d-dimer 0.65; other most recent labs  triglycerides 172, LDL68    Expected Outcome Goal not met. Edras has medical history of IPF, lymphoma, OSA, CAD, aortic stenosis, diastolic heart failure. Lipids are well controlled on repatha. He does report side effects of nausea, diarrhea, decreased appetitie on esbriet . He reports that weight has been stable over the last few years; will continue to monitor weight and appetitie. He is down 1.8# since starting with our program. He lives at [facility] and typically eats 1-2 meals on campus. He continues 1-2 nutrition supplements per day. We have discussed multiple strategies for weight gain/weight maintenance including increasing calorie density of foods, nutrition supplements, eating frequency, etc. He continues regular follow-up with cardiology and pulmonology; he is continues work-up for PE. Patient will benefit from participation in pulmonary rehab for nutrition, exercise, and lifestyle modification.             Psychosocial: Target Goals: Acknowledge presence or absence of significant depression and/or stress, maximize coping skills, provide positive support system. Participant is able to verbalize types and ability to use techniques and skills needed for reducing stress and depression.  Initial Review & Psychosocial Screening:  Initial Psych Review & Screening - 10/01/23 0931       Initial Review   Current issues with None Identified      Family Dynamics   Good Support System? Yes    Comments spouse and children      Barriers   Psychosocial barriers to participate in program There are no identifiable barriers or psychosocial needs.      Screening  Interventions   Interventions Encouraged to exercise             Quality of Life Scores:  Scores of 19 and below usually indicate a poorer quality of life in these areas.  A difference of  2-3 points is a clinically meaningful difference.  A difference of 2-3 points in the total score of the Quality of Life Index has been associated with significant improvement in overall quality of life, self-image, physical symptoms, and general health in studies assessing change in quality of life.  PHQ-9: Review Flowsheet       05/21/2017  Depression screen PHQ 2/9  Decreased Interest 0  Down, Depressed, Hopeless 0  PHQ - 2 Score 0   Interpretation of Total Score  Total Score Depression Severity:  1-4 = Minimal depression,  5-9 = Mild depression, 10-14 = Moderate depression, 15-19 = Moderately severe depression, 20-27 = Severe depression   Psychosocial Evaluation and Intervention:  Psychosocial Evaluation - 10/01/23 0931       Psychosocial Evaluation & Interventions   Interventions Encouraged to exercise with the program and follow exercise prescription    Comments Trent denies any psychosocial barriers at this time.    Expected Outcomes patient will remain free from psychosocial barriers to participation in pulmonary rehab    Continue Psychosocial Services  No Follow up required             Psychosocial Re-Evaluation:  Psychosocial Re-Evaluation     Row Name 10/13/23 1117 11/10/23 1029           Psychosocial Re-Evaluation   Current issues with None Identified None Identified      Comments Tandre denies any psychosocial barriers or concerns at this time. Sharone denies any psychosocial barriers or concerns at this time.      Expected Outcomes For Finnean to participate in PR free of any psychosocial barriers or concerns For Jekhi to participate in PR free of any psychosocial barriers or concerns      Interventions Encouraged to attend Pulmonary Rehabilitation for the exercise  Encouraged to attend Pulmonary Rehabilitation for the exercise      Continue Psychosocial Services  No Follow up required No Follow up required               Psychosocial Discharge (Final Psychosocial Re-Evaluation):  Psychosocial Re-Evaluation - 11/10/23 1029       Psychosocial Re-Evaluation   Current issues with None Identified    Comments Stony denies any psychosocial barriers or concerns at this time.    Expected Outcomes For Muril to participate in PR free of any psychosocial barriers or concerns    Interventions Encouraged to attend Pulmonary Rehabilitation for the exercise    Continue Psychosocial Services  No Follow up required             Education: Education Goals: Education classes will be provided on a weekly basis, covering required topics. Participant will state understanding/return demonstration of topics presented.  Learning Barriers/Preferences:  Learning Barriers/Preferences - 10/01/23 0932       Learning Barriers/Preferences   Learning Barriers None    Learning Preferences Written Material;Verbal Instruction;Group Instruction;Individual Instruction             Education Topics: Know Your Numbers Group instruction that is supported by a PowerPoint presentation. Instructor discusses importance of knowing and understanding resting, exercise, and post-exercise oxygen  saturation, heart rate, and blood pressure. Oxygen  saturation, heart rate, blood pressure, rating of perceived exertion, and dyspnea are reviewed along with a normal range for these values.  Flowsheet Row PULMONARY REHAB OTHER RESPIRATORY from 10/07/2023 in [facility] for [facility], Vascular, & [facility]  Date 10/07/23  Educator EP  Instruction Review Code 1- Verbalizes Understanding       Exercise for the Pulmonary Patient Group instruction that is supported by a PowerPoint presentation. Instructor discusses benefits of exercise, core components of exercise,  frequency, duration, and intensity of an exercise routine, importance of utilizing pulse oximetry during exercise, safety while exercising, and options of places to exercise outside of rehab.    MET Level  Group instruction provided by PowerPoint, verbal discussion, and written material to support subject matter. Instructor reviews what METs are and how to increase METs.    Pulmonary Medications Verbally interactive group education provided by instructor  with focus on inhaled medications and proper administration.   Anatomy and Physiology of the Respiratory System Group instruction provided by PowerPoint, verbal discussion, and written material to support subject matter. Instructor reviews respiratory cycle and anatomical components of the respiratory system and their functions. Instructor also reviews differences in obstructive and restrictive respiratory diseases with examples of each.    Oxygen  Safety Group instruction provided by PowerPoint, verbal discussion, and written material to support subject matter. There is an overview of "What is Oxygen " and "Why do we need it".  Instructor also reviews how to create a safe environment for oxygen  use, the importance of using oxygen  as prescribed, and the risks of noncompliance. There is a brief discussion on traveling with oxygen  and resources the patient may utilize. Flowsheet Row PULMONARY REHAB OTHER RESPIRATORY from 10/14/2023 in [facility], Vascular, & Lung Health  Date 10/14/23  Educator RN  Instruction Review Code 1- Verbalizes Understanding       Oxygen  Use Group instruction provided by PowerPoint, verbal discussion, and written material to discuss how supplemental oxygen  is prescribed and different types of oxygen  supply systems. Resources for more information are provided.  Flowsheet Row PULMONARY REHAB OTHER RESPIRATORY from 10/21/2023 in [facility] Heart, Vascular, &  Lung Health  Date 10/21/23  Educator RT  Instruction Review Code 1- Verbalizes Understanding       Breathing Techniques Group instruction that is supported by demonstration and informational handouts. Instructor discusses the benefits of pursed lip and diaphragmatic breathing and detailed demonstration on how to perform both.  Flowsheet Row PULMONARY REHAB OTHER RESPIRATORY from 10/28/2023 in [facility] for Heart, Vascular, & Lung Health  Date 10/28/23  Educator RN  Instruction Review Code 1- Verbalizes Understanding        Risk Factor Reduction Group instruction that is supported by a PowerPoint presentation. Instructor discusses the definition of a risk factor, different risk factors for pulmonary disease, and how the heart and lungs work together.   Pulmonary Diseases Group instruction provided by PowerPoint, verbal discussion, and written material to support subject matter. Instructor gives an overview of the different type of pulmonary diseases. There is also a discussion on risk factors and symptoms as well as ways to manage the diseases.   Stress and Energy Conservation Group instruction provided by PowerPoint, verbal discussion, and written material to support subject matter. Instructor gives an overview of stress and the impact it can have on the body. Instructor also reviews ways to reduce stress. There is also a discussion on energy conservation and ways to conserve energy throughout the day.   Warning Signs and Symptoms Group instruction provided by PowerPoint, verbal discussion, and written material to support subject matter. Instructor reviews warning signs and symptoms of stroke, heart attack, cold and flu. Instructor also reviews ways to prevent the spread of infection. Flowsheet Row PULMONARY REHAB OTHER RESPIRATORY from 11/11/2023 in [facility] for Heart, Vascular, & Lung Health  Date 11/11/23  Educator RN   Instruction Review Code 1- Verbalizes Understanding       Other Education Group or individual verbal, written, or video instructions that support the educational goals of the pulmonary rehab program.    Knowledge Questionnaire Score:  Knowledge Questionnaire Score - 10/01/23 1022       Knowledge Questionnaire Score   Pre Score 18/18             Core Components/Risk Factors/Patient Goals  at Admission:  Personal Goals and Risk Factors at Admission - 10/01/23 0932       Core Components/Risk Factors/Patient Goals on Admission   Improve shortness of breath with ADL's Yes    Intervention Provide education, individualized exercise plan and daily activity instruction to help decrease symptoms of SOB with activities of daily living.    Expected Outcomes Short Term: Improve cardiorespiratory fitness to achieve a reduction of symptoms when performing ADLs;Long Term: Be able to perform more ADLs without symptoms or delay the onset of symptoms             Core Components/Risk Factors/Patient Goals Review:   Goals and Risk Factor Review     Row Name 10/13/23 1119 11/10/23 1030           Core Components/Risk Factors/Patient Goals Review   Personal Goals Review Improve shortness of breath with ADL's;Develop more efficient breathing techniques such as purse lipped breathing and diaphragmatic breathing and practicing self-pacing with activity.;Increase knowledge of respiratory medications and ability to use respiratory devices properly. Improve shortness of breath with ADL's;Develop more efficient breathing techniques such as purse lipped breathing and diaphragmatic breathing and practicing self-pacing with activity.;Increase knowledge of respiratory medications and ability to use respiratory devices properly.      Review Monthly review of patient's Core Components/Risk Factors/Patient Goals are as follows: Goal progressing for improving shortness of breath with ADL's. Gurjeet is  currently using 6L O2 to maintain sats >88% while exercising. He is currently exercising on the treadmill and the bike. Goal progressing for developing more efficient breathing techniques such as purse lipped breathing and diaphragmatic breathing; and practicing self-pacing with activity. Goal progressing for increasing knowledge of respiratory medications and the ability to use respiratroy devices properly. We will continue to monitor Markez progress throughout the program. Monthly review of patient's Core Components/Risk Factors/Patient Goals are as follows: Goal progressing for improving shortness of breath with ADL's. Kyrie is currently using 4-6L O2 to maintain sats >88% while exercising. He is currently exercising on the treadmill and the bike. Goal met for developing more efficient breathing techniques such as purse lipped breathing and diaphragmatic breathing; and practicing self-pacing with activity. He has attended the breathing technique education and has been practicing diaphragmatic breathing at home. He is able to demonstrate purse lip breathing when he gets SOB and knows how to self pace based on the RPE/dyspnea scale. Goal progressing for increasing knowledge of respiratory medications and the ability to use respiratroy devices properly. We will continue to monitor Kevis progress throughout the program.      Expected Outcomes To improve shortness of breath with ADL's, develop more efficient breathing techniques such as purse lipped breathing and diaphragmatic breathing; and practicing self-pacing with activity and  increase knowledge of respiratory medications and the ability to use respiratroy devices properly. To improve shortness of breath with ADL's and  increase knowledge of respiratory medications and the ability to use respiratroy devices properly.               Core Components/Risk Factors/Patient Goals at Discharge (Final Review):   Goals and Risk Factor Review - 11/10/23 1030        Core Components/Risk Factors/Patient Goals Review   Personal Goals Review Improve shortness of breath with ADL's;Develop more efficient breathing techniques such as purse lipped breathing and diaphragmatic breathing and practicing self-pacing with activity.;Increase knowledge of respiratory medications and ability to use respiratory devices properly.    Review Monthly review of patient's Core Components/Risk Factors/Patient  Goals are as follows: Goal progressing for improving shortness of breath with ADL's. Dorion is currently using 4-6L O2 to maintain sats >88% while exercising. He is currently exercising on the treadmill and the bike. Goal met for developing more efficient breathing techniques such as purse lipped breathing and diaphragmatic breathing; and practicing self-pacing with activity. He has attended the breathing technique education and has been practicing diaphragmatic breathing at home. He is able to demonstrate purse lip breathing when he gets SOB and knows how to self pace based on the RPE/dyspnea scale. Goal progressing for increasing knowledge of respiratory medications and the ability to use respiratroy devices properly. We will continue to monitor Franklin progress throughout the program.    Expected Outcomes To improve shortness of breath with ADL's and  increase knowledge of respiratory medications and the ability to use respiratroy devices properly.             ITP Comments:Pt is making expected progress toward Pulmonary Rehab goals after completing 12 session(s). Recommend continued exercise, life style modification, education, and utilization of breathing techniques to increase stamina and strength, while also decreasing shortness of breath with exertion.  Dr. Genetta Kenning is Medical Director for Pulmonary Rehab at [facility].     Comments:

## 2023-11-18 ENCOUNTER — Ambulatory Visit: Payer: Self-pay | Admitting: Internal Medicine

## 2023-11-18 ENCOUNTER — Encounter (HOSPITAL_COMMUNITY)
Admission: RE | Admit: 2023-11-18 | Discharge: 2023-11-18 | Disposition: A | Discharge status: Home / Self Care | Source: Ambulatory Visit | Attending: Pulmonary Disease | Admitting: Pulmonary Disease

## 2023-11-18 DIAGNOSIS — J84112 Idiopathic pulmonary fibrosis: Secondary | ICD-10-CM

## 2023-11-18 NOTE — Progress Notes (Signed)
 Daily Session Note  Patient Details  Name: Kenneth [name] MRN: [id number] Date of Birth: [birth year] Referring Provider:   [name] Pulmonary Rehab Walk Test from 10/01/2023 in [facility], [facility], [facility]  Referring Provider [name]  [[name]]       Encounter Date: 11/18/2023  Check In:  Session Check In - 11/18/23 1023       Check-In   Supervising physician immediately available to respond to emergencies [facility] MD immediately available    Physician(s) [name], NP    Location [facility]    Staff Present Sueellen Emery BS, ACSM-CEP, Exercise Physiologist;Casey Felipe Horton, Graig Lawyer, RN, Mercedes Stake, RN, BSN;Johnny Alexia Angelucci, MS, Exercise Physiologist    Virtual Visit No    Medication changes reported     No    Fall or balance concerns reported    No    Tobacco Cessation No Change    Warm-up and Cool-down Performed as group-led instruction    Resistance Training Performed Yes    VAD Patient? No    PAD/SET Patient? No      Pain Assessment   Currently in Pain? No/denies    Pain Score 0-No pain    Multiple Pain Sites No             Capillary Blood Glucose: No results found for this or any previous visit (from the past 24 hours).    Social History   Tobacco Use  Smoking Status Never  Smokeless Tobacco Never    Goals Met:  Independence with exercise equipment Exercise tolerated well No report of concerns or symptoms today Strength training completed today  Goals Unmet:  Not Applicable  Comments: Service time is from 1008 to 1134    Dr. Genetta Kenning is Medical Director for Pulmonary Rehab at [facility].

## 2023-11-18 NOTE — Progress Notes (Signed)
 Results discussed with patient. He does not feel need for CTA. He feels better. He will stay in touch with me. Ca re=epeat D-dimer if needed

## 2023-11-22 ENCOUNTER — Other Ambulatory Visit: Payer: PPO

## 2023-11-22 ENCOUNTER — Inpatient Hospital Stay: Payer: Self-pay

## 2023-11-22 ENCOUNTER — Inpatient Hospital Stay: Payer: PPO | Attending: Oncology | Admitting: Oncology

## 2023-11-22 ENCOUNTER — Encounter (HOSPITAL_COMMUNITY): Admitting: Cardiology

## 2023-11-22 VITALS — BP 98/61 | HR 90 | Temp 98.1°F | Resp 18 | Ht 69.0 in | Wt 164.0 lb

## 2023-11-22 DIAGNOSIS — C61 Malignant neoplasm of prostate: Secondary | ICD-10-CM | POA: Diagnosis not present

## 2023-11-22 DIAGNOSIS — J84112 Idiopathic pulmonary fibrosis: Secondary | ICD-10-CM | POA: Diagnosis not present

## 2023-11-22 DIAGNOSIS — Z79899 Other long term (current) drug therapy: Secondary | ICD-10-CM | POA: Insufficient documentation

## 2023-11-22 DIAGNOSIS — K5730 Diverticulosis of large intestine without perforation or abscess without bleeding: Secondary | ICD-10-CM | POA: Diagnosis not present

## 2023-11-22 DIAGNOSIS — C833A Diffuse large b-cell lymphoma, in remission: Secondary | ICD-10-CM | POA: Insufficient documentation

## 2023-11-22 DIAGNOSIS — Z9981 Dependence on supplemental oxygen: Secondary | ICD-10-CM | POA: Diagnosis not present

## 2023-11-22 DIAGNOSIS — C8330 Diffuse large B-cell lymphoma, unspecified site: Secondary | ICD-10-CM | POA: Diagnosis not present

## 2023-11-22 DIAGNOSIS — K648 Other hemorrhoids: Secondary | ICD-10-CM | POA: Diagnosis not present

## 2023-11-22 DIAGNOSIS — R0600 Dyspnea, unspecified: Secondary | ICD-10-CM | POA: Diagnosis not present

## 2023-11-22 DIAGNOSIS — K589 Irritable bowel syndrome without diarrhea: Secondary | ICD-10-CM | POA: Insufficient documentation

## 2023-11-22 DIAGNOSIS — R110 Nausea: Secondary | ICD-10-CM | POA: Insufficient documentation

## 2023-11-22 DIAGNOSIS — D649 Anemia, unspecified: Secondary | ICD-10-CM | POA: Insufficient documentation

## 2023-11-22 DIAGNOSIS — R630 Anorexia: Secondary | ICD-10-CM | POA: Diagnosis not present

## 2023-11-22 LAB — CBC WITH DIFFERENTIAL (CANCER CENTER ONLY)
Abs Immature Granulocytes: 0.02 10*3/uL (ref 0.00–0.07)
Basophils Absolute: 0 10*3/uL (ref 0.0–0.1)
Basophils Relative: 0 %
Eosinophils Absolute: 0.3 10*3/uL (ref 0.0–0.5)
Eosinophils Relative: 4 %
HCT: 35.6 % — ABNORMAL LOW (ref 39.0–52.0)
Hemoglobin: 12.2 g/dL — ABNORMAL LOW (ref 13.0–17.0)
Immature Granulocytes: 0 %
Lymphocytes Relative: 14 %
Lymphs Abs: 0.9 10*3/uL (ref 0.7–4.0)
MCH: 30.7 pg (ref 26.0–34.0)
MCHC: 34.3 g/dL (ref 30.0–36.0)
MCV: 89.4 fL (ref 80.0–100.0)
Monocytes Absolute: 0.5 10*3/uL (ref 0.1–1.0)
Monocytes Relative: 8 %
Neutro Abs: 4.4 10*3/uL (ref 1.7–7.7)
Neutrophils Relative %: 74 %
Platelet Count: 223 10*3/uL (ref 150–400)
RBC: 3.98 MIL/uL — ABNORMAL LOW (ref 4.22–5.81)
RDW: 13.7 % (ref 11.5–15.5)
WBC Count: 6 10*3/uL (ref 4.0–10.5)
nRBC: 0 % (ref 0.0–0.2)

## 2023-11-22 LAB — CMP (CANCER CENTER ONLY)
ALT: 12 U/L (ref 0–44)
AST: 20 U/L (ref 15–41)
Albumin: 4.2 g/dL (ref 3.5–5.0)
Alkaline Phosphatase: 60 U/L (ref 38–126)
Anion gap: 9 (ref 5–15)
BUN: 10 mg/dL (ref 8–23)
CO2: 29 mmol/L (ref 22–32)
Calcium: 9.2 mg/dL (ref 8.9–10.3)
Chloride: 96 mmol/L — ABNORMAL LOW (ref 98–111)
Creatinine: 0.72 mg/dL (ref 0.61–1.24)
GFR, Estimated: >60 mL/min (ref >60)
Glucose, Bld: 177 mg/dL — ABNORMAL HIGH (ref 70–99)
Potassium: 4.3 mmol/L (ref 3.5–5.1)
Sodium: 133 mmol/L — ABNORMAL LOW (ref 135–145)
Total Bilirubin: 0.4 mg/dL (ref 0.0–1.2)
Total Protein: 6.6 g/dL (ref 6.5–8.1)

## 2023-11-22 LAB — LACTATE DEHYDROGENASE: LDH: 222 U/L — ABNORMAL HIGH (ref 98–192)

## 2023-11-22 MED ORDER — ONDANSETRON HCL 8 MG PO TABS: 8.0000 mg | ORAL_TABLET | Freq: Three times a day (TID) | ORAL | 2 refills | Status: DC | PRN

## 2023-11-22 NOTE — Progress Notes (Signed)
 [facility] OFFICE PROGRESS NOTE   Diagnosis: Non-Hodgkin lymphoma  INTERVAL HISTORY:   Kenneth [name] returns as scheduled.  No fever or night sweats.  No palpable lymph nodes.  Kenneth [name] has a poor appetite and intermittent nausea.  Kenneth [name] was admitted in February with COVID-19.  Kenneth [name] has increased dyspnea following the COVID infection.  Kenneth [name] is followed by Dr. Bertrum Brodie for pulmonary fibrosis.  Kenneth [name] is now maintained on continuous oxygen .  Objective:  Vital signs in last 24 hours:  Blood pressure 98/61, pulse 90, temperature 98.1 F (36.7 C), temperature source Temporal, resp. rate 18, height 5\' 9"  (1.753 m), weight 164 lb (74.4 kg), SpO2 93%.    Lymphatics: No cervical, supraclavicular, axillary, or inguinal nodes Resp: Inspiratory rales at the right greater than left chest, no respiratory distress Cardio: Regular rhythm with occasional pause GI: No hepatosplenomegaly Vascular: No leg edema   Lab Results:  Lab Results  Component Value Date   WBC 6.0 11/22/2023   HGB 12.2 (L) 11/22/2023   HCT 35.6 (L) 11/22/2023   MCV 89.4 11/22/2023   PLT 223 11/22/2023   NEUTROABS 4.4 11/22/2023    CMP  Lab Results  Component Value Date   NA 133 (L) 11/03/2023   K 4.4 11/03/2023   CL 96 (L) 11/03/2023   CO2 25 11/03/2023   GLUCOSE 105 (H) 11/03/2023   BUN 15 11/03/2023   CREATININE 0.68 11/03/2023   CALCIUM  9.0 11/03/2023   PROT 5.9 (L) 11/03/2023   ALBUMIN  4.0 11/03/2023   AST 19 11/03/2023   ALT 11 11/03/2023   ALKPHOS 55 11/03/2023   BILITOT 0.2 11/03/2023   GFRNONAA >60 11/03/2023   GFRAA >60 06/01/2017    Medications: I have reviewed the patient's current medications.   Assessment/Plan: Non-Hodgkin's lymphoma CTs neck and chest 03/19/2021-cluster of left supraclavicular nodes measuring less than 1 cm, 12 mm right node next to the jugular vein, chest with slight interval progression of pulmonary fibrosis, new small sclerotic lesion in the right third rib concerning for  metastasis, lucent lesion in T8 corresponding to abnormal uptake on a bone scan 05/21/2020 suspicious for malignancy, subpectoral nodes-subcentimeter in size but larger than 03/28/2019 FNA of left neck node 03/25/2021-atypical lymphoid proliferation Excisional biopsy of level 5 left neck node 04/07/2021-lymph node with marked autolysis and diffusely infiltrated by atypical lymphocytes, 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 Cycle 1 R-mini CHOP 07/10/2021 Cycle 2 R-mini CHOP 07/31/2021 Cycle 3 R-mini CHOP 08/21/2021 Cycle 4 R-mini CHOP 09/11/2021 PET 09/29/2021-near complete resolution of hypermetabolic lymph nodes, no remaining hypermetabolic disease, wall thickening and hypermetabolic activity at the transverse and sigmoid colon Cycle 5 R-mini CHOP 10/02/2021 Cycle 6 R-mini CHOP 10/23/2021, Udenyca  10/24/2021 Prostate cancer-T3  adenocarcinoma, Gleason 5+4, external beam XRT 09/12/2020 -  11/07/2020, maintained on androgen deprivation therapy 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  Anemia-chronic 7.  C4-5, C5-6, C6-7-anterior cervical discectomy and fusion 08/11/2022 8.  Admission in February 2025 with COVID-19       Disposition: Kenneth [name] remains in clinical remission from non-Hodgkin's lymphoma.  Kenneth [name] is now 2 years out from completing a course of R-mini CHOP.  Kenneth [name] has multiple comorbid conditions including oxygen  dependent pulmonary fibrosis.  Kenneth [name] will return for an office and lab visit in 6 months.  Kenneth [name] is followed closely by Dr. Bertrum Brodie.  Kenneth [name] is seen by cardiology for an irregular heart rate.  Coni Deep, MD  11/22/2023  10:13 AM

## 2023-11-23 ENCOUNTER — Encounter (HOSPITAL_COMMUNITY)
Admission: RE | Admit: 2023-11-23 | Discharge: 2023-11-23 | Disposition: A | Discharge status: Home / Self Care | Source: Ambulatory Visit | Attending: Pulmonary Disease | Admitting: Pulmonary Disease

## 2023-11-23 ENCOUNTER — Telehealth: Payer: Self-pay | Admitting: Oncology

## 2023-11-23 DIAGNOSIS — J84112 Idiopathic pulmonary fibrosis: Secondary | ICD-10-CM

## 2023-11-23 NOTE — Progress Notes (Signed)
 Daily Session Note  Patient Details  Name: Kenneth [name] MRN: [id number] Date of Birth: Nov 25, 1940 Referring Provider:   [name] Pulmonary Rehab Walk Test from 10/01/2023 in [facility] for Heart, Vascular, & Lung Health  Referring Provider [name]  [[name]]       Encounter Date: 11/23/2023  Check In:  Session Check In - 11/23/23 1119       Check-In   Supervising physician immediately available to respond to emergencies [facility] MD immediately available    Physician(s) [name], NP    Location [facility]    Staff Present Sueellen Emery BS, ACSM-CEP, Exercise Physiologist;Casey Carmen Chol, RN, Shasta Deist, MS, ACSM-CEP, Exercise Physiologist    Virtual Visit No    Medication changes reported     No    Fall or balance concerns reported    No    Tobacco Cessation No Change    Warm-up and Cool-down Performed as group-led instruction    Resistance Training Performed Yes    VAD Patient? No    PAD/SET Patient? No      Pain Assessment   Currently in Pain? No/denies    Multiple Pain Sites No             Capillary Blood Glucose: No results found for this or any previous visit (from the past 24 hours).    Social History   Tobacco Use  Smoking Status Never  Smokeless Tobacco Never    Goals Met:  Proper associated with RPD/PD & O2 Sat Independence with exercise equipment Exercise tolerated well No report of concerns or symptoms today Strength training completed today  Goals Unmet:  Not Applicable  Comments: Service time is from 1011 to 1125.    Dr. Genetta Kenning is Medical Director for Pulmonary Rehab at [facility].

## 2023-11-23 NOTE — Telephone Encounter (Signed)
 Patient has been scheduled for follow-up visit per 11/22/23 LOS.  Pt aware of scheduled appt details.

## 2023-11-24 DIAGNOSIS — I493 Ventricular premature depolarization: Secondary | ICD-10-CM | POA: Diagnosis not present

## 2023-11-24 DIAGNOSIS — J9601 Acute respiratory failure with hypoxia: Secondary | ICD-10-CM | POA: Diagnosis not present

## 2023-11-24 DIAGNOSIS — U071 COVID-19: Secondary | ICD-10-CM | POA: Diagnosis not present

## 2023-11-25 ENCOUNTER — Encounter (HOSPITAL_COMMUNITY)
Admission: RE | Admit: 2023-11-25 | Discharge: 2023-11-25 | Disposition: A | Discharge status: Home / Self Care | Source: Ambulatory Visit | Attending: Pulmonary Disease | Admitting: Pulmonary Disease

## 2023-11-25 DIAGNOSIS — J84112 Idiopathic pulmonary fibrosis: Secondary | ICD-10-CM | POA: Diagnosis not present

## 2023-11-25 NOTE — Progress Notes (Signed)
 Daily Session Note  Patient Details  Name: Kenneth [name] MRN: [id number] Date of Birth: 03-25-1941 Referring Provider:   [name] Pulmonary Rehab Walk Test from 10/01/2023 in [facility] for Heart, Vascular, & Lung Health  Referring Provider [name]  [[name]]       Encounter Date: 11/25/2023  Check In:  Session Check In - 11/25/23 1104       Check-In   Supervising physician immediately available to respond to emergencies [facility] MD immediately available    Physician(s) Charles Connor, NP    [facility]    Staff Present Sueellen Emery BS, ACSM-CEP, Exercise Physiologist;Casey Carmen Chol, RN, Shasta Deist, MS, ACSM-CEP, Exercise Physiologist;Samantha Belarus, RD, LDN    Virtual Visit No    Medication changes reported     No    Fall or balance concerns reported    No    Tobacco Cessation No Change    Warm-up and Cool-down Performed as group-led instruction    Resistance Training Performed Yes    VAD Patient? No    PAD/SET Patient? No      Pain Assessment   Currently in Pain? No/denies    Pain Score 0-No pain    Multiple Pain Sites No             Capillary Blood Glucose: No results found for this or any previous visit (from the past 24 hours).    Social History   Tobacco Use  Smoking Status Never  Smokeless Tobacco Never    Goals Met:  Proper associated with RPD/PD & O2 Sat Exercise tolerated well No report of concerns or symptoms today Strength training completed today  Goals Unmet:  Not Applicable  Comments: Service time is from 1011 to 1140.    Dr. Genetta Kenning is Medical Director for Pulmonary Rehab at [facility].

## 2023-11-26 ENCOUNTER — Other Ambulatory Visit: Payer: Self-pay | Admitting: Pulmonary Disease

## 2023-11-26 DIAGNOSIS — J84112 Idiopathic pulmonary fibrosis: Secondary | ICD-10-CM

## 2023-11-26 NOTE — Telephone Encounter (Signed)
 Refill sent for PIRFENIDONE  to [facility] (pulmonary fibrosis team). [phone]  Dose: 801mg  three times daily  Last OV: 11/04/23 Provider: Dr. Bertrum Brodie Pertinent labs: LFTs 11/22/2023 wnl  Next OV: 12/17/23  Geraldene Kleine, PharmD, MPH, BCPS Clinical Pharmacist (Rheumatology and Pulmonology)

## 2023-11-28 ENCOUNTER — Ambulatory Visit (HOSPITAL_COMMUNITY): Payer: Self-pay | Admitting: Cardiology

## 2023-11-30 ENCOUNTER — Encounter (HOSPITAL_COMMUNITY)
Admission: RE | Admit: 2023-11-30 | Discharge: 2023-11-30 | Disposition: A | Discharge status: Home / Self Care | Source: Ambulatory Visit | Attending: Pulmonary Disease

## 2023-11-30 VITALS — Wt 165.8 lb

## 2023-11-30 DIAGNOSIS — J84112 Idiopathic pulmonary fibrosis: Secondary | ICD-10-CM | POA: Diagnosis not present

## 2023-11-30 NOTE — Progress Notes (Signed)
 Daily Session Note  Patient Details  Name: Kenneth [name] MRN: [id number] Date of Birth: 1940-08-26 Referring Provider:   Gattis [name] Pulmonary Rehab Walk Test from 10/01/2023 in [facility] for Heart, Vascular, & Lung Health  Referring Provider [name]  [Kenneth [name]]       Encounter Date: 11/30/2023  Check In:  Session Check In - 11/30/23 1012       Check-In   Supervising physician immediately available to respond to emergencies [facility] MD immediately available    Physician(s) Charles Connor, NP    [facility]    Staff Present Sueellen Emery BS, ACSM-CEP, Exercise Physiologist;Elo Marmolejos Arlester Ladd, RN, BSN;Johnny Alexia Angelucci, MS, Exercise Physiologist;Kaylee Nolon Baxter, MS, ACSM-CEP, Exercise Physiologist    Virtual Visit No    Medication changes reported     No    Fall or balance concerns reported    No    Tobacco Cessation No Change    Warm-up and Cool-down Performed as group-led instruction    Resistance Training Performed Yes    VAD Patient? No    PAD/SET Patient? No      Pain Assessment   Currently in Pain? No/denies    Pain Score 0-No pain    Multiple Pain Sites No             Capillary Blood Glucose: No results found for this or any previous visit (from the past 24 hours).   Exercise Prescription Changes - 11/30/23 1100       Response to Exercise   Blood Pressure (Admit) 96/46    Blood Pressure (Exercise) 112/62    Blood Pressure (Exit) 112/90    Heart Rate (Admit) 78 bpm    Heart Rate (Exercise) 105 bpm    Heart Rate (Exit) 118 bpm   post vomitting   Oxygen  Saturation (Admit) 97 %   4L   Oxygen  Saturation (Exercise) 92 %   4L   Oxygen  Saturation (Exit) 96 %   4L   Rating of Perceived Exertion (Exercise) 15    Perceived Dyspnea (Exercise) 3    Duration Continue with 30 min of aerobic exercise without signs/symptoms of physical distress.    Intensity THRR unchanged      Progression   Progression Continue to progress workloads to  maintain intensity without signs/symptoms of physical distress.      Resistance Training   Training Prescription Yes    Weight blue bands    Reps 10-15    Time 10 Minutes      Oxygen    Oxygen  Continuous    Liters 4L      Treadmill   MPH 2    Grade 1    Minutes 15    METs 2.6      Bike   Level 3    Minutes 5    METs --   Did not complete, got ill and stopped     Oxygen    Maintain Oxygen  Saturation 88% or higher             Social History   Tobacco Use  Smoking Status Never  Smokeless Tobacco Never    Goals Met:  Independence with exercise equipment  Goals Unmet:  Kenneth [name] got ill on the 2nd exercise station. Started vomiting. Stated he did not take his zofran  this morning. GI upset d/t pirfenidone . Pt VSS, left class early  Comments: Service time is from 1009 to 1104   Dr. Genetta Kenning is Medical Director for Pulmonary Rehab at  [facility].

## 2023-12-01 MED ORDER — METOPROLOL SUCCINATE ER 50 MG PO TB24: 50.0000 mg | ORAL_TABLET | Freq: Two times a day (BID) | ORAL | Status: DC

## 2023-12-01 NOTE — Telephone Encounter (Signed)
 Pt aware, agreeable, and verbalized understanding. Medication list updated.

## 2023-12-01 NOTE — Telephone Encounter (Signed)
-----   Message from Nurse Emer C sent at 11/30/2023 10:34 AM EDT -----  ----- Message ----- From: Darlis Eisenmenger, MD Sent: 11/28/2023   9:36 PM EDT To: [facility] Triage Pool  Multiple short SVT runs, 5.4% PVCs.  No atrial fibrillation. Increase Toprol  XL to 50 mg bid.

## 2023-12-02 ENCOUNTER — Encounter (HOSPITAL_COMMUNITY)
Admission: RE | Admit: 2023-12-02 | Discharge: 2023-12-02 | Disposition: A | Discharge status: Home / Self Care | Source: Ambulatory Visit | Attending: Pulmonary Disease | Admitting: Pulmonary Disease

## 2023-12-02 DIAGNOSIS — J84112 Idiopathic pulmonary fibrosis: Secondary | ICD-10-CM

## 2023-12-02 NOTE — Progress Notes (Signed)
 Daily Session Note  Patient Details  Name: Kenneth [name] MRN: [id number] Date of Birth: Sep 06, 1940 Referring Provider:   [name] Pulmonary Rehab Walk Test from 10/01/2023 in [facility] for Heart, Vascular, & Lung Health  Referring Provider [name]  [[name]]       Encounter Date: 12/02/2023  Check In:  Session Check In - 12/02/23 1025       Check-In   Supervising physician immediately available to respond to emergencies [facility] MD immediately available    Physician(s) Marlana Silvan, NP    [facility]    Staff Present Sueellen Emery BS, ACSM-CEP, Exercise Physiologist;Mary Arlester Ladd, RN, BSN;Johnny Alexia Angelucci, MS, Exercise Physiologist;Kaylee Nolon Baxter, MS, ACSM-CEP, Exercise Physiologist;Casey Felipe Horton, RT    Virtual Visit No    Medication changes reported     No    Fall or balance concerns reported    No    Tobacco Cessation No Change    Warm-up and Cool-down Performed as group-led instruction    Resistance Training Performed Yes    VAD Patient? No    PAD/SET Patient? No      Pain Assessment   Currently in Pain? No/denies    Pain Score 0-No pain    Multiple Pain Sites No             Capillary Blood Glucose: No results found for this or any previous visit (from the past 24 hours).    Social History   Tobacco Use  Smoking Status Never  Smokeless Tobacco Never    Goals Met:  Independence with exercise equipment Exercise tolerated well No report of concerns or symptoms today Strength training completed today  Goals Unmet:  Not Applicable  Comments: Service time is from 1004 to 1140.    Dr. Genetta Kenning is Medical Director for Pulmonary Rehab at [facility].

## 2023-12-06 ENCOUNTER — Telehealth (HOSPITAL_COMMUNITY): Payer: Self-pay

## 2023-12-06 NOTE — Telephone Encounter (Signed)
 Pt called and stated he would notbe in class on 12/07/23. He had a fall over the weekend and is dealing with back and rib pain.

## 2023-12-07 ENCOUNTER — Encounter (HOSPITAL_COMMUNITY)

## 2023-12-07 DIAGNOSIS — M4726 Other spondylosis with radiculopathy, lumbar region: Secondary | ICD-10-CM | POA: Diagnosis not present

## 2023-12-09 ENCOUNTER — Other Ambulatory Visit: Payer: Self-pay | Admitting: Internal Medicine

## 2023-12-09 ENCOUNTER — Encounter (HOSPITAL_COMMUNITY)
Admission: RE | Admit: 2023-12-09 | Discharge: 2023-12-09 | Disposition: A | Discharge status: Home / Self Care | Source: Ambulatory Visit | Attending: Pulmonary Disease | Admitting: Pulmonary Disease

## 2023-12-09 DIAGNOSIS — J84112 Idiopathic pulmonary fibrosis: Secondary | ICD-10-CM | POA: Diagnosis not present

## 2023-12-09 NOTE — Progress Notes (Signed)
 Daily Session Note  Patient Details  Name: Kenneth [name] MRN: [id number] Date of Birth: 10-28-1940 Referring Provider:   [name] Pulmonary Rehab Walk Test from 10/01/2023 in [facility] for Heart, Vascular, & Lung Health  Referring Provider [name]  [[name]]       Encounter Date: 12/09/2023  Check In:  Session Check In - 12/09/23 1033       Check-In   Supervising physician immediately available to respond to emergencies [facility] MD immediately available    Physician(s) [name], NP    [facility]    Staff Present Sueellen Emery BS, ACSM-CEP, Exercise Physiologist;Kaylee Nolon Baxter, MS, ACSM-CEP, Exercise Physiologist;Dezi Schaner Vernadine Golas Belarus, RD, LDN    Virtual Visit No    Medication changes reported     No    Fall or balance concerns reported    No    Tobacco Cessation No Change    Warm-up and Cool-down Performed as group-led instruction    Resistance Training Performed Yes    VAD Patient? No    PAD/SET Patient? No      Pain Assessment   Currently in Pain? No/denies             Capillary Blood Glucose: No results found for this or any previous visit (from the past 24 hours).    Social History   Tobacco Use  Smoking Status Never  Smokeless Tobacco Never    Goals Met:  Proper associated with RPD/PD & O2 Sat Independence with exercise equipment Exercise tolerated well No report of concerns or symptoms today Strength training completed today  Goals Unmet:  Not Applicable  Comments: Service time is from 1012 to 1140.    Dr. Genetta Kenning is Medical Director for Pulmonary Rehab at [facility].

## 2023-12-13 DIAGNOSIS — M4726 Other spondylosis with radiculopathy, lumbar region: Secondary | ICD-10-CM | POA: Diagnosis not present

## 2023-12-14 ENCOUNTER — Encounter (HOSPITAL_COMMUNITY)
Admission: RE | Admit: 2023-12-14 | Discharge: 2023-12-14 | Disposition: A | Discharge status: Home / Self Care | Source: Ambulatory Visit | Attending: Pulmonary Disease

## 2023-12-14 VITALS — Wt 164.2 lb

## 2023-12-14 DIAGNOSIS — J84112 Idiopathic pulmonary fibrosis: Secondary | ICD-10-CM | POA: Diagnosis not present

## 2023-12-14 NOTE — Progress Notes (Signed)
 Daily Session Note  Patient Details  Name: Kenneth [name] MRN: [id number] Date of Birth: 04/23/1941 Referring Provider:   [name] Pulmonary Rehab Walk Test from 10/01/2023 in [facility] for Heart, Vascular, & Lung Health  Referring Provider [name]  [[name]]       Encounter Date: 12/14/2023  Check In:  Session Check In - 12/14/23 1123       Check-In   Supervising physician immediately available to respond to emergencies [facility] MD immediately available    Physician(s) [name], NP    [facility]    Staff Present Sueellen Emery BS, ACSM-CEP, Exercise Physiologist;Kaylee Nolon Baxter, MS, ACSM-CEP, Exercise Physiologist;Cecile Guevara Vernadine Golas Belarus, RD, Toy Freund, RN, BSN    Virtual Visit No    Medication changes reported     No    Fall or balance concerns reported    No    Tobacco Cessation No Change    Warm-up and Cool-down Performed as group-led instruction    Resistance Training Performed Yes    VAD Patient? No    PAD/SET Patient? No      Pain Assessment   Currently in Pain? No/denies    Pain Score 0-No pain    Multiple Pain Sites No             Capillary Blood Glucose: No results found for this or any previous visit (from the past 24 hours).   Exercise Prescription Changes - 12/14/23 1200       Response to Exercise   Blood Pressure (Admit) 110/60    Blood Pressure (Exercise) 112/60    Blood Pressure (Exit) 118/72    Heart Rate (Admit) 89 bpm    Heart Rate (Exercise) 94 bpm    Heart Rate (Exit) 86 bpm    Oxygen  Saturation (Admit) 99 %    Oxygen  Saturation (Exercise) 93 %    Oxygen  Saturation (Exit) 97 %    Rating of Perceived Exertion (Exercise) 13    Perceived Dyspnea (Exercise) 2    Duration Continue with 30 min of aerobic exercise without signs/symptoms of physical distress.    Intensity THRR unchanged      Progression   Progression Continue to progress workloads to maintain intensity without  signs/symptoms of physical distress.      Resistance Training   Training Prescription Yes    Weight blue bands    Reps 10-15    Time 10 Minutes      Oxygen    Oxygen  Continuous    Liters 4      Treadmill   MPH 2    Grade 1    Minutes 15    METs 2.6      Bike   Level 2.8    Minutes 15    METs 3      Oxygen    Maintain Oxygen  Saturation 88% or higher             Social History   Tobacco Use  Smoking Status Never  Smokeless Tobacco Never    Goals Met:  Proper associated with RPD/PD & O2 Sat Independence with exercise equipment Exercise tolerated well No report of concerns or symptoms today Strength training completed today  Goals Unmet:  Not Applicable  Comments: Service time is from 1020 to 1146.    Dr. Genetta Kenning is Medical Director for Pulmonary Rehab at [facility].

## 2023-12-15 NOTE — Progress Notes (Signed)
 Pulmonary Individual Treatment Plan  Patient Details  Name: Kenneth [name] MRN: [id number] Date of Birth: 1941/04/03 Referring Provider:   [name] Pulmonary Rehab Walk Test from 10/01/2023 in [facility]  Referring Provider [name]  [[name]]       Initial Encounter Date:  Flowsheet Row Pulmonary Rehab Walk Test from 10/01/2023 in [facility]  Date 10/01/23       Visit Diagnosis: 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HCC)  Patient's Home Medications on Admission:   Current Outpatient Medications:    acetaminophen  (TYLENOL ) 500 MG tablet, Take 1,000 mg by mouth every 6 (six) hours as needed for moderate pain., Disp: , Rfl:    acetaminophen -codeine  (TYLENOL  #3) 300-30 MG tablet, Take 1 tablet by mouth every 4 (four) hours as needed (for pain)., Disp: , Rfl:    albuterol  (VENTOLIN  HFA) 108 (90 Base) MCG/ACT inhaler, Inhale 2 puffs into the lungs every 6 (six) hours as needed for wheezing or shortness of breath. (Patient not taking: Reported on 11/22/2023), Disp: 6.7 g, Rfl: 2   aspirin  81 MG chewable tablet, Chew 81 mg by mouth 3 (three) times a week., Disp: , Rfl:    carboxymethylcellulose (REFRESH TEARS) 0.5 % SOLN, Place 1 drop into both eyes daily as needed (dry eyes)., Disp: , Rfl:    carisoprodol  (SOMA ) 250 MG tablet, Take 175-350 mg by mouth 3 (three) times daily as needed (for muscle spasms)., Disp: , Rfl:    Cholecalciferol  (VITAMIN D -3) 1000 UNITS CAPS, Take 2,000 Units by mouth every evening., Disp: , Rfl:    CO-ENZYME Q-10 PO, Take 1 tablet by mouth every evening. , Disp: , Rfl:    Cyanocobalamin  (VITAMIN B12) 1000 MCG TBCR, Take 1,000 mcg by mouth daily., Disp: , Rfl:    denosumab  (PROLIA ) 60 MG/ML SOSY injection, Inject 60 mg into the skin every 6 (six) months. At [facility], Disp: , Rfl:    desvenlafaxine (PRISTIQ) 50 MG 24 hr tablet, Take 50 mg  by mouth daily., Disp: , Rfl:    diclofenac  (VOLTAREN ) 75 MG EC tablet, Take 75 mg by mouth 2 (two) times daily as needed (pain.). (Patient not taking: Reported on 11/22/2023), Disp: , Rfl: 2   diclofenac  sodium (VOLTAREN ) 1 % GEL, Apply 4 g topically 4 (four) times daily as needed (pain.)., Disp: , Rfl: 12   dicyclomine  (BENTYL ) 10 MG capsule, Take one by mouth every 6 hours as needed for abdominal cramping, Disp: 30 capsule, Rfl: 1   diphenoxylate-atropine (LOMOTIL) 2.5-0.025 MG tablet, Take 1 tablet by mouth as needed for diarrhea or loose stools., Disp: , Rfl:    Emollient (AQUAPHOR ADV PROTECT HEALING EX), Apply 1 Application topically daily as needed (itching)., Disp: , Rfl:    Evolocumab (REPATHA) 140 MG/ML SOSY, Inject 140 mg into the skin every 30 (thirty) days., Disp: , Rfl:    Glucosamine Sulfate 500 MG TABS, Take 500 mg by mouth daily., Disp: , Rfl:    HYDROcodone -acetaminophen  (NORCO) 10-325 MG tablet, Take 1 tablet by mouth See admin instructions. Take 1 tablet by mouth every 4-6 hours as needed for pain, Disp: , Rfl:    latanoprost  (XALATAN ) 0.005 % ophthalmic solution, Place 1 drop into both eyes at bedtime., Disp: , Rfl:    loratadine  (CLARITIN ) 10 MG tablet, Take 10 mg by mouth daily., Disp: , Rfl:    meclizine  (ANTIVERT ) 25 MG tablet, Take 25 mg  by mouth 3 (three) times daily as needed for dizziness., Disp: , Rfl:    metoprolol  succinate (TOPROL -XL) 50 MG 24 hr tablet, Take 1 tablet (50 mg total) by mouth in the morning and at bedtime. Take with or immediately following a meal., Disp: , Rfl:    Multiple Vitamin (MULTIVITAMIN) tablet, Take 1 tablet by mouth daily with breakfast., Disp: , Rfl:    omeprazole  (PRILOSEC) 40 MG capsule, Take 1 capsule (40 mg total) by mouth daily. Call our office at [phone] to schedule an appointment for additional refills., Disp: 30 capsule, Rfl: 0   ondansetron  (ZOFRAN ) 8 MG tablet, Take 1 tablet (8 mg total) by mouth every 8 (eight) hours as needed  for nausea or vomiting., Disp: 30 tablet, Rfl: 2   Pirfenidone  801 MG TABS, TAKE 1 TABLET THREE TIMES A DAY WITH MEALS, Disp: 270 tablet, Rfl: 5   Probiotic Product (ALIGN) 4 MG CAPS, Take 4 mg by mouth daily., Disp: , Rfl:    SODIUM FLUORIDE 5000 PLUS 1.1 % CREA dental cream, Place 1 Application onto teeth at bedtime., Disp: , Rfl:    XHANCE  93 MCG/ACT EXHU, Place 1 spray into both nostrils 2 (two) times daily as needed (for sinus issues)., Disp: , Rfl:   Past Medical History: Past Medical History:  Diagnosis Date   Arthritis    Depression    Diffuse large B cell lymphoma (HCC) 2023   Diverticulosis of colon (without mention of hemorrhage)    GERD (gastroesophageal reflux disease)    Hiatal hernia    HLD (hyperlipidemia)    Internal hemorrhoids without mention of complication    Irritable bowel syndrome    Other specified gastritis without mention of hemorrhage    Prostate cancer (HCC) 2022   Pulmonary fibrosis (HCC)    Stress fracture 02/2021    Tobacco Use: Social History   Tobacco Use  Smoking Status Never  Smokeless Tobacco Never    Labs: Review Flowsheet       Latest Ref Rng & Units 04/14/2022 12/01/2022 08/22/2023  Labs for ITP Cardiac and Pulmonary Rehab  Cholestrol 0 - 200 mg/dL - 098  -  LDL (calc) 0 - 99 mg/dL - 68  -  HDL-C >11 mg/dL - 50  -  Trlycerides <914 mg/dL - 782  -  Bicarbonate 95.6 - 28.0 mmol/L 26.3  25.1  - 23.6   TCO2 22 - 32 mmol/L 28  26  - 25   O2 Saturation % 76  75  - 97     Details       Multiple values from one day are sorted in reverse-chronological order         Capillary Blood Glucose: Lab Results  Component Value Date   GLUCAP 119 (H) 09/29/2021   GLUCAP 118 (H) 05/26/2021     Pulmonary Assessment Scores:  Pulmonary Assessment Scores     Row Name 10/01/23 0940         ADL UCSD   ADL Phase Entry     SOB Score total 47       CAT Score   CAT Score 12       mMRC Score   mMRC Score 1              UCSD: Self-administered rating of dyspnea associated with activities of daily living (ADLs) 6-point scale (0 = not at all to 5 = maximal or unable to do because of breathlessness)  Scoring Scores range from 0 to  120.  Minimally important difference is 5 units  CAT: CAT can identify the health impairment of COPD patients and is better correlated with disease progression.  CAT has a scoring range of zero to 40. The CAT score is classified into four groups of low (less than 10), medium (10 - 20), high (21-30) and very high (31-40) based on the impact level of disease on health status. A CAT score over 10 suggests significant symptoms.  A worsening CAT score could be explained by an exacerbation, poor medication adherence, poor inhaler technique, or progression of COPD or comorbid conditions.  CAT MCID is 2 points  mMRC: mMRC (Modified Medical Research Council) Dyspnea Scale is used to assess the degree of baseline functional disability in patients of respiratory disease due to dyspnea. No minimal important difference is established. A decrease in score of 1 point or greater is considered a positive change.   Pulmonary Function Assessment:  Pulmonary Function Assessment - 10/01/23 1400       Breath   Bilateral Breath Sounds Decreased    Shortness of Breath Yes;Limiting activity             Exercise Target Goals: Exercise Program Goal: Individual exercise prescription set using results from initial 6 min walk test and THRR while considering  patient's activity barriers and safety.   Exercise Prescription Goal: Initial exercise prescription builds to 30-45 minutes a day of aerobic activity, 2-3 days per week.  Home exercise guidelines will be given to patient during program as part of exercise prescription that the participant will acknowledge.  Activity Barriers & Risk Stratification:  Activity Barriers & Cardiac Risk Stratification - 10/01/23 0928       Activity Barriers &  Cardiac Risk Stratification   Activity Barriers Back Problems;Shortness of Breath;Deconditioning;Muscular Weakness             6 Minute Walk:  6 Minute Walk     Row Name 10/01/23 1030         6 Minute Walk   Phase Initial     Distance 810 feet     Walk Time 6 minutes     # of Rest Breaks 2  1:41-2:00, 3:36-4:03     MPH 1.53     METS 1.87     RPE 13     Perceived Dyspnea  2     VO2 Peak 6.56     Symptoms No     Resting HR 110 bpm     Resting BP 122/64     Resting Oxygen  Saturation  91 %     Exercise Oxygen  Saturation  during 6 min walk 86 %     Max Ex. HR 122 bpm     Max Ex. BP 132/70     2 Minute Post BP 128/70       Interval HR   1 Minute HR 110     2 Minute HR 96     3 Minute HR 120     4 Minute HR 122     5 Minute HR 97     6 Minute HR 121     2 Minute Post HR 92     Interval Heart Rate? Yes       Interval Oxygen    Interval Oxygen ? Yes     Baseline Oxygen  Saturation % 91 %     1 Minute Oxygen  Saturation % 87 %     1 Minute Liters of Oxygen  0 L  increased to 1L  2 Minute Oxygen  Saturation % 86 %     2 Minute Liters of Oxygen  1 L  increased to 2L     3 Minute Oxygen  Saturation % 88 %     3 Minute Liters of Oxygen  2 L  increased to 3L     4 Minute Oxygen  Saturation % 89 %     4 Minute Liters of Oxygen  3 L     5 Minute Oxygen  Saturation % 91 %     5 Minute Liters of Oxygen  3 L     6 Minute Oxygen  Saturation % 89 %     6 Minute Liters of Oxygen  3 L     2 Minute Post Oxygen  Saturation % 94 %     2 Minute Post Liters of Oxygen  3 L              Oxygen  Initial Assessment:  Oxygen  Initial Assessment - 10/01/23 0925       Home Oxygen    Home Oxygen  Device Portable Concentrator;Home Concentrator    Sleep Oxygen  Prescription Continuous    Liters per minute 2    Home Exercise Oxygen  Prescription Pulsed    Liters per minute 2    Home Resting Oxygen  Prescription None    Compliance with Home Oxygen  Use Yes      Initial 6 min Walk   Oxygen  Used  Continuous    Liters per minute 2      Program Oxygen  Prescription   Program Oxygen  Prescription Continuous    Liters per minute 2      Intervention   Short Term Goals To learn and exhibit compliance with exercise, home and travel O2 prescription;To learn and understand importance of maintaining oxygen  saturations>88%;To learn and demonstrate proper use of respiratory medications;To learn and understand importance of monitoring SPO2 with pulse oximeter and demonstrate accurate use of the pulse oximeter.;To learn and demonstrate proper pursed lip breathing techniques or other breathing techniques.     Long  Term Goals Exhibits compliance with exercise, home  and travel O2 prescription;Verbalizes importance of monitoring SPO2 with pulse oximeter and return demonstration;Exhibits proper breathing techniques, such as pursed lip breathing or other method taught during program session;Maintenance of O2 saturations>88%;Compliance with respiratory medication;Demonstrates proper use of MDI's             Oxygen  Re-Evaluation:  Oxygen  Re-Evaluation     Row Name 10/13/23 0853 11/11/23 1612 12/10/23 0834         Program Oxygen  Prescription   Program Oxygen  Prescription Continuous Continuous Continuous     Liters per minute 2 4 4        Home Oxygen    Home Oxygen  Device Portable Concentrator;Home Concentrator Portable Concentrator;Home Concentrator Portable Concentrator;Home Concentrator     Sleep Oxygen  Prescription Continuous Continuous Continuous     Liters per minute 2 2 2      Home Exercise Oxygen  Prescription Pulsed Pulsed Pulsed     Liters per minute 2 2 2      Home Resting Oxygen  Prescription None None None     Compliance with Home Oxygen  Use Yes Yes Yes       Goals/Expected Outcomes   Short Term Goals To learn and exhibit compliance with exercise, home and travel O2 prescription;To learn and understand importance of maintaining oxygen  saturations>88%;To learn and demonstrate proper use  of respiratory medications;To learn and understand importance of monitoring SPO2 with pulse oximeter and demonstrate accurate use of the pulse oximeter.;To learn and demonstrate proper pursed lip breathing techniques  or other breathing techniques.  To learn and exhibit compliance with exercise, home and travel O2 prescription;To learn and understand importance of maintaining oxygen  saturations>88%;To learn and demonstrate proper use of respiratory medications;To learn and understand importance of monitoring SPO2 with pulse oximeter and demonstrate accurate use of the pulse oximeter.;To learn and demonstrate proper pursed lip breathing techniques or other breathing techniques.  To learn and exhibit compliance with exercise, home and travel O2 prescription;To learn and understand importance of maintaining oxygen  saturations>88%;To learn and demonstrate proper use of respiratory medications;To learn and understand importance of monitoring SPO2 with pulse oximeter and demonstrate accurate use of the pulse oximeter.;To learn and demonstrate proper pursed lip breathing techniques or other breathing techniques.      Long  Term Goals Exhibits compliance with exercise, home  and travel O2 prescription;Verbalizes importance of monitoring SPO2 with pulse oximeter and return demonstration;Exhibits proper breathing techniques, such as pursed lip breathing or other method taught during program session;Maintenance of O2 saturations>88%;Compliance with respiratory medication;Demonstrates proper use of MDI's Exhibits compliance with exercise, home  and travel O2 prescription;Verbalizes importance of monitoring SPO2 with pulse oximeter and return demonstration;Exhibits proper breathing techniques, such as pursed lip breathing or other method taught during program session;Maintenance of O2 saturations>88%;Compliance with respiratory medication;Demonstrates proper use of MDI's Exhibits compliance with exercise, home  and travel O2  prescription;Verbalizes importance of monitoring SPO2 with pulse oximeter and return demonstration;Exhibits proper breathing techniques, such as pursed lip breathing or other method taught during program session;Maintenance of O2 saturations>88%;Compliance with respiratory medication;Demonstrates proper use of MDI's     Goals/Expected Outcomes Compliance and understanding of oxygen  saturation monitoring and breathing techniques to decrease shortness of breath. Compliance and understanding of oxygen  saturation monitoring and breathing techniques to decrease shortness of breath. Compliance and understanding of oxygen  saturation monitoring and breathing techniques to decrease shortness of breath.              Oxygen  Discharge (Final Oxygen  Re-Evaluation):  Oxygen  Re-Evaluation - 12/10/23 0834       Program Oxygen  Prescription   Program Oxygen  Prescription Continuous    Liters per minute 4      Home Oxygen    Home Oxygen  Device Portable Concentrator;Home Concentrator    Sleep Oxygen  Prescription Continuous    Liters per minute 2    Home Exercise Oxygen  Prescription Pulsed    Liters per minute 2    Home Resting Oxygen  Prescription None    Compliance with Home Oxygen  Use Yes      Goals/Expected Outcomes   Short Term Goals To learn and exhibit compliance with exercise, home and travel O2 prescription;To learn and understand importance of maintaining oxygen  saturations>88%;To learn and demonstrate proper use of respiratory medications;To learn and understand importance of monitoring SPO2 with pulse oximeter and demonstrate accurate use of the pulse oximeter.;To learn and demonstrate proper pursed lip breathing techniques or other breathing techniques.     Long  Term Goals Exhibits compliance with exercise, home  and travel O2 prescription;Verbalizes importance of monitoring SPO2 with pulse oximeter and return demonstration;Exhibits proper breathing techniques, such as pursed lip breathing or other  method taught during program session;Maintenance of O2 saturations>88%;Compliance with respiratory medication;Demonstrates proper use of MDI's    Goals/Expected Outcomes Compliance and understanding of oxygen  saturation monitoring and breathing techniques to decrease shortness of breath.             Initial Exercise Prescription:  Initial Exercise Prescription - 10/01/23 1000       Date of Initial  Exercise RX and Referring Provider   Date 10/01/23    Referring Provider Erika Haver   Expected Discharge Date 12/21/23      Oxygen    Oxygen  Continuous    Liters 3    Maintain Oxygen  Saturation 88% or higher      Treadmill   MPH 1.8    Grade 0    Minutes 15    METs 2      Bike   Level 1    Watts 20    Minutes 40    METs 1.5      Prescription Details   Frequency (times per week) 2    Duration Progress to 30 minutes of continuous aerobic without signs/symptoms of physical distress      Intensity   THRR 40-80% of Max Heartrate 56-135    Ratings of Perceived Exertion 11-13    Perceived Dyspnea 0-4      Progression   Progression Continue to progress workloads to maintain intensity without signs/symptoms of physical distress.      Resistance Training   Training Prescription Yes    Weight blue bands    Reps 10-15             Perform Capillary Blood Glucose checks as needed.  Exercise Prescription Changes:   Exercise Prescription Changes     Row Name 10/05/23 1200 10/19/23 1100 11/02/23 1200 11/16/23 1100 11/30/23 1100     Response to Exercise   Blood Pressure (Admit) 96/64 116/64 112/68 122/72 96/46   Blood Pressure (Exercise) 104/50 -- 120/60 110/56 112/62   Blood Pressure (Exit) 104/64 100/56 96/52 116/56 112/90   Heart Rate (Admit) 91 bpm 83 bpm 80 bpm 64 bpm 78 bpm   Heart Rate (Exercise) 107 bpm 105 bpm 112 bpm 113 bpm 105 bpm   Heart Rate (Exit) 83 bpm 92 bpm 79 bpm 89 bpm 118 bpm  post vomiting   Oxygen  Saturation (Admit) 99 % 99 % 96 % 93 %  2L  97 %  4L   Oxygen  Saturation (Exercise) 95 % 87 % 94 % 89 %  4L 92 %  4L   Oxygen  Saturation (Exit) 94 % 92 % 94 % 93 %  RA 96 %  4L   Rating of Perceived Exertion (Exercise) 11 13 13 13 15    Perceived Dyspnea (Exercise) 1 2 2 2 3    Duration Continue with 30 min of aerobic exercise without signs/symptoms of physical distress. Continue with 30 min of aerobic exercise without signs/symptoms of physical distress. Continue with 30 min of aerobic exercise without signs/symptoms of physical distress. Continue with 30 min of aerobic exercise without signs/symptoms of physical distress. Continue with 30 min of aerobic exercise without signs/symptoms of physical distress.   Intensity THRR unchanged THRR unchanged THRR unchanged THRR unchanged THRR unchanged     Progression   Progression Continue to progress workloads to maintain intensity without signs/symptoms of physical distress. Continue to progress workloads to maintain intensity without signs/symptoms of physical distress. Continue to progress workloads to maintain intensity without signs/symptoms of physical distress. Continue to progress workloads to maintain intensity without signs/symptoms of physical distress. Continue to progress workloads to maintain intensity without signs/symptoms of physical distress.     Resistance Training   Training Prescription Yes Yes Yes Yes Yes   Weight blue bands blue bands blue bands blue bands blue bands   Reps 10-15 10-15 10-15 10-15 10-15   Time 10 Minutes 10 Minutes 10 Minutes 10 Minutes  10 Minutes     Oxygen    Oxygen  Continuous Continuous Continuous Continuous Continuous   Liters 4 6 4-6 2-4  2-4 4L     Treadmill   MPH 1.8 2 1.8 2 2    Grade 0.5 0 0 1 1   Minutes 15 15 15 15 15    METs 2.2 2.4 2.2 2.6 2.6     Bike   Level 2 2 2.5 3 3    Minutes 15 15 15 15 5    METs 2.8 2.8 3 3  --  Did not complete, got ill and stopped     Oxygen    Maintain Oxygen  Saturation 88% or higher 88% or higher -- 88% or  higher 88% or higher    Row Name 12/14/23 1200             Response to Exercise   Blood Pressure (Admit) 110/60       Blood Pressure (Exercise) 112/60       Blood Pressure (Exit) 118/72       Heart Rate (Admit) 89 bpm       Heart Rate (Exercise) 94 bpm       Heart Rate (Exit) 86 bpm       Oxygen  Saturation (Admit) 99 %       Oxygen  Saturation (Exercise) 93 %       Oxygen  Saturation (Exit) 97 %       Rating of Perceived Exertion (Exercise) 13       Perceived Dyspnea (Exercise) 2       Duration Continue with 30 min of aerobic exercise without signs/symptoms of physical distress.       Intensity THRR unchanged         Progression   Progression Continue to progress workloads to maintain intensity without signs/symptoms of physical distress.         Resistance Training   Training Prescription Yes       Weight blue bands       Reps 10-15       Time 10 Minutes         Oxygen    Oxygen  Continuous       Liters 4         Treadmill   MPH 2       Grade 1       Minutes 15       METs 2.6         Bike   Level 2.8       Minutes 15       METs 3         Oxygen    Maintain Oxygen  Saturation 88% or higher                Exercise Comments:   Exercise Comments     Row Name 10/05/23 1209 11/02/23 1152         Exercise Comments Pt completed his first day of group exercise. He walked on the treadmill for 15 min. Initially 1.8 mph, 1.0 incline. However pt SpO2 86 2L therefore decreased incline to 0 %. METs 2.5 to 2.1. He then exercised on the scibike for 15 min, level 2, METs 2.8. Tolerated well. Performed warm up and cool down without limitations, including squats. Discussed METs with good reception. Pt motivated. Completed home exercise plan. Eliza is currently exercising at home but not consistently. I encouraged him to exercise 1 non-rehab day/wk for 30 min/day. He agreed with my recommendations. Taran can walk outside or use  his seated bike. Kelven seems very motivated to  exercise outside of rehab.               Exercise Goals and Review:   Exercise Goals     Row Name 10/01/23 [phone]             Exercise Goals   Increase Physical Activity Yes       Intervention Provide advice, education, support and counseling about physical activity/exercise needs.;Develop an individualized exercise prescription for aerobic and resistive training based on initial evaluation findings, risk stratification, comorbidities and participant's personal goals.       Expected Outcomes Short Term: Attend rehab on a regular basis to increase amount of physical activity.;Long Term: Add in home exercise to make exercise part of routine and to increase amount of physical activity.;Long Term: Exercising regularly at least 3-5 days a week.       Increase Strength and Stamina Yes       Intervention Provide advice, education, support and counseling about physical activity/exercise needs.;Develop an individualized exercise prescription for aerobic and resistive training based on initial evaluation findings, risk stratification, comorbidities and participant's personal goals.       Expected Outcomes Short Term: Increase workloads from initial exercise prescription for resistance, speed, and METs.;Short Term: Perform resistance training exercises routinely during rehab and add in resistance training at home;Long Term: Improve cardiorespiratory fitness, muscular endurance and strength as measured by increased METs and functional capacity ( )       Able to understand and use rate of perceived exertion (RPE) scale Yes       Intervention Provide education and explanation on how to use RPE scale       Expected Outcomes Short Term: Able to use RPE daily in rehab to express subjective intensity level;Long Term:  Able to use RPE to guide intensity level when exercising independently       Able to understand and use Dyspnea scale Yes       Intervention Provide education and explanation on how to use  Dyspnea scale       Expected Outcomes Short Term: Able to use Dyspnea scale daily in rehab to express subjective sense of shortness of breath during exertion;Long Term: Able to use Dyspnea scale to guide intensity level when exercising independently       Knowledge and understanding of Target Heart Rate Range (THRR) Yes       Intervention Provide education and explanation of THRR including how the numbers were predicted and where they are located for reference       Expected Outcomes Short Term: Able to state/look up THRR;Short Term: Able to use daily as guideline for intensity in rehab;Long Term: Able to use THRR to govern intensity when exercising independently       Understanding of Exercise Prescription Yes       Intervention Provide education, explanation, and written materials on patient's individual exercise prescription       Expected Outcomes Short Term: Able to explain program exercise prescription;Long Term: Able to explain home exercise prescription to exercise independently                Exercise Goals Re-Evaluation :  Exercise Goals Re-Evaluation     Row Name 10/13/23 0851 11/11/23 1610 12/10/23 0832         Exercise Goal Re-Evaluation   Exercise Goals Review Increase Physical Activity;Able to understand and use Dyspnea scale;Understanding of Exercise Prescription;Increase Strength and Stamina;Knowledge and understanding of Target Heart  Rate Range (THRR);Able to understand and use rate of perceived exertion (RPE) scale Increase Physical Activity;Able to understand and use Dyspnea scale;Understanding of Exercise Prescription;Increase Strength and Stamina;Knowledge and understanding of Target Heart Rate Range (THRR);Able to understand and use rate of perceived exertion (RPE) scale Increase Physical Activity;Able to understand and use Dyspnea scale;Understanding of Exercise Prescription;Increase Strength and Stamina;Knowledge and understanding of Target Heart Rate Range (THRR);Able  to understand and use rate of perceived exertion (RPE) scale     Comments Kyzen has completed 3 exercise sessions. He exercises for 15 min on the treadmill and upright bike. He averages 2.2 METs at 1.8 mph on the treadmill and 2.8 METs at level 2 on the recumbent elliptical. Jenine Mix performs the warmup and cooldown standing without limitations. It is too soon to notate any discernable progressions. Will continue to monitor and progress as able. Dugan has completed 11 exercise sessions. He exercises for 15 min on the treadmill and upright bike. He averages 2.4 METs at 2 mph on the treadmill and 2.8 METs at level 2.2 on the upright bike. Taytum performs the warmup and cooldown standing without limitations. Ole has increased his speed on the treadmill and level on the bike. He tolerates progressions well. Will continue to monitor and progress as able. Lenardo has completed 18 exercise sessions. He exercises for 15 min on the treadmill and upright bike. He averages 2.6 METs at 2 mph and 1% on the treadmill and 3.2 METs at level 3 on the upright bike. Raymont performs the warmup and cooldown standing without limitations. Graysyn continue to increase his speed on the treadmill and level on the bike. He still tolerates progressions well. Dahl had set back recently due to a fall. He is still motivated to exercise. Will continue to monitor and progress as able.     Expected Outcomes Through exercise at rehab and home, the patient will decrease shortness of breath with daily activities and feel confident in carrying out an exercise regimen at home. Through exercise at rehab and home, the patient will decrease shortness of breath with daily activities and feel confident in carrying out an exercise regimen at home. Through exercise at rehab and home, the patient will decrease shortness of breath with daily activities and feel confident in carrying out an exercise regimen at home.              Discharge Exercise Prescription (Final  Exercise Prescription Changes):  Exercise Prescription Changes - 12/14/23 1200       Response to Exercise   Blood Pressure (Admit) 110/60    Blood Pressure (Exercise) 112/60    Blood Pressure (Exit) 118/72    Heart Rate (Admit) 89 bpm    Heart Rate (Exercise) 94 bpm    Heart Rate (Exit) 86 bpm    Oxygen  Saturation (Admit) 99 %    Oxygen  Saturation (Exercise) 93 %    Oxygen  Saturation (Exit) 97 %    Rating of Perceived Exertion (Exercise) 13    Perceived Dyspnea (Exercise) 2    Duration Continue with 30 min of aerobic exercise without signs/symptoms of physical distress.    Intensity THRR unchanged      Progression   Progression Continue to progress workloads to maintain intensity without signs/symptoms of physical distress.      Resistance Training   Training Prescription Yes    Weight blue bands    Reps 10-15    Time 10 Minutes      Oxygen    Oxygen  Continuous  Liters 4      Treadmill   MPH 2    Grade 1    Minutes 15    METs 2.6      Bike   Level 2.8    Minutes 15    METs 3      Oxygen    Maintain Oxygen  Saturation 88% or higher             Nutrition:  Target Goals: Understanding of nutrition guidelines, daily intake of sodium 1500mg , cholesterol 200mg , calories 30% from fat and 7% or less from saturated fats, daily to have 5 or more servings of fruits and vegetables.  Biometrics:    Nutrition Therapy Plan and Nutrition Goals:  Nutrition Therapy & Goals - 12/09/23 1052       Nutrition Therapy   Diet General Healthy Diet      Personal Nutrition Goals   Nutrition Goal Patient to maintain weight throughout pulmonary rehab and understand strategies for weight maintenance/weight gain as needed.   goal in progress.   Comments Goal in progress. 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 Lipids are well controlled on repatha. He does report side effects of nausea, diarrhea, constipation, decreased  appetitie on esbriet . He reports that weight has been stable over the last few years; will continue to monitor weight and appetitie. He has maintained his weight since starting with our program. He lives at [facility] and typically eats 1-2 meals on campus. He has increased to 2 nutrition supplements per day. We have discussed multiple strategies for weight gain/weight maintenance including increasing calorie density of foods, nutrition supplements, eating frequency, etc.  Patient will benefit from participation in pulmonary rehab for nutrition, exercise, and lifestyle modification.      Intervention Plan   Intervention Prescribe, educate and counsel regarding individualized specific dietary modifications aiming towards targeted core components such as weight, hypertension, lipid management, diabetes, heart failure and other comorbidities.;Nutrition handout(s) given to patient.    Expected Outcomes Short Term Goal: Understand basic principles of dietary content, such as calories, fat, sodium, cholesterol and nutrients.;Long Term Goal: Adherence to prescribed nutrition plan.             Nutrition Assessments:  Nutrition Assessments - 10/05/23 1205       Rate Your Plate Scores   Pre Score 52            MEDIFICTS Score Key: >=70 Need to make dietary changes  40-70 Heart Healthy Diet <= 40 Therapeutic Level Cholesterol Diet  Flowsheet Row PULMONARY REHAB OTHER RESPIRATORY from 10/05/2023 in [facility]  Picture Your Plate Total Score on Admission 52      Picture Your Plate Scores: <40 Unhealthy dietary pattern with much room for improvement. 41-50 Dietary pattern unlikely to meet recommendations for good health and room for improvement. 51-60 More healthful dietary pattern, with some room for improvement.  >60 Healthy dietary pattern, although there may be some specific behaviors that could be improved.    Nutrition Goals  Re-Evaluation:  Nutrition Goals Re-Evaluation     Row Name 10/05/23 1155 11/09/23 1102 12/09/23 1052         Goals   Current Weight 167 lb 8.8 oz (76 kg) 165 lb 12.6 oz (75.2 kg) 166 lb 14.2 oz (75.7 kg)     Comment triglycerides 172, LDL68 d-dimer 0.65; other most recent labs  triglycerides 172, LDL68 d-dimer 0.65; other most recent  labs triglycerides 172, LDL68     Expected Outcome Eron has medical history of IPF, lymphoma, OSA, CAD, aortic stenosis, diastolic heart failure. Lipids are well controlled on repatha. He does report side effects of nausea, diarrhea, decreased appetitie on esbriet . He reports that weight has been stable over the last few years; will continue to monitor weight and appetitie. He lives at [facility] and typically eats 1-2 meals on campus. Patient will benefit from participation in pulmonary rehab for nutrition, exercise, and lifestyle modification. Goal not met. Falon has medical history of IPF, lymphoma, OSA, CAD, aortic stenosis, diastolic heart failure. Lipids are well controlled on repatha. He does report side effects of nausea, diarrhea, decreased appetitie on esbriet . He reports that weight has been stable over the last few years; will continue to monitor weight and appetitie. He is down 1.8# since starting with our program. He lives at [facility] and typically eats 1-2 meals on campus. He continues 1-2 nutrition supplements per day. We have discussed multiple strategies for weight gain/weight maintenance including increasing calorie density of foods, nutrition supplements, eating frequency, etc. He continues regular follow-up with cardiology and pulmonology; he is continues work-up for PE. Patient will benefit from participation in pulmonary rehab for nutrition, exercise, and lifestyle modification. Goal in progress. Jefferie has medical history of IPF, lymphoma, OSA, CAD, aortic stenosis, diastolic heart failure. Lipids are well controlled on repatha. He does report side  effects of nausea, diarrhea, constipation, decreased appetitie on esbriet . He reports that weight has been stable over the last few years; will continue to monitor weight and appetitie. He has maintained his weight since starting with our program. He lives at [facility] and typically eats 1-2 meals on campus. He has increased to 2 nutrition supplements per day. We have discussed multiple strategies for weight gain/weight maintenance including increasing calorie density of foods, nutrition supplements, eating frequency, etc. Patient will benefit from participation in pulmonary rehab for nutrition, exercise, and lifestyle modification.              Nutrition Goals Discharge (Final Nutrition Goals Re-Evaluation):  Nutrition Goals Re-Evaluation - 12/09/23 1052       Goals   Current Weight 166 lb 14.2 oz (75.7 kg)    Comment d-dimer 0.65; other most recent labs triglycerides 172, LDL68    Expected Outcome Goal in progress. Haygen has medical history of IPF, lymphoma, OSA, CAD, aortic stenosis, diastolic heart failure. Lipids are well controlled on repatha. He does report side effects of nausea, diarrhea, constipation, decreased appetitie on esbriet . He reports that weight has been stable over the last few years; will continue to monitor weight and appetitie. He has maintained his weight since starting with our program. He lives at [facility] and typically eats 1-2 meals on campus. He has increased to 2 nutrition supplements per day. We have discussed multiple strategies for weight gain/weight maintenance including increasing calorie density of foods, nutrition supplements, eating frequency, etc. Patient will benefit from participation in pulmonary rehab for nutrition, exercise, and lifestyle modification.             Psychosocial: Target Goals: Acknowledge presence or absence of significant depression and/or stress, maximize coping skills, provide positive support system. Participant is able to  verbalize types and ability to use techniques and skills needed for reducing stress and depression.  Initial Review & Psychosocial Screening:  Initial Psych Review & Screening - 10/01/23 0931       Initial Review   Current issues with None Identified  Family Dynamics   Good Support System? Yes    Comments spouse and children      Barriers   Psychosocial barriers to participate in program There are no identifiable barriers or psychosocial needs.      Screening Interventions   Interventions Encouraged to exercise             Quality of Life Scores:  Scores of 19 and below usually indicate a poorer quality of life in these areas.  A difference of  2-3 points is a clinically meaningful difference.  A difference of 2-3 points in the total score of the Quality of Life Index has been associated with significant improvement in overall quality of life, self-image, physical symptoms, and general health in studies assessing change in quality of life.  PHQ-9: Review Flowsheet       05/21/2017  Depression screen PHQ 2/9  Decreased Interest 0  Down, Depressed, Hopeless 0  PHQ - 2 Score 0   Interpretation of Total Score  Total Score Depression Severity:  1-4 = Minimal depression, 5-9 = Mild depression, 10-14 = Moderate depression, 15-19 = Moderately severe depression, 20-27 = Severe depression   Psychosocial Evaluation and Intervention:  Psychosocial Evaluation - 10/01/23 0931       Psychosocial Evaluation & Interventions   Interventions Encouraged to exercise with the program and follow exercise prescription    Comments Shaydon denies any psychosocial barriers at this time.    Expected Outcomes patient will remain free from psychosocial barriers to participation in pulmonary rehab    Continue Psychosocial Services  No Follow up required             Psychosocial Re-Evaluation:  Psychosocial Re-Evaluation     Row Name 10/13/23 1117 11/10/23 1029 12/08/23 0937          Psychosocial Re-Evaluation   Current issues with None Identified None Identified None Identified     Comments Jensyn denies any psychosocial barriers or concerns at this time. Dejay denies any psychosocial barriers or concerns at this time. Sorren continues to deny any psychosocial barriers or concerns at this time.     Expected Outcomes For Horatio to participate in PR free of any psychosocial barriers or concerns For Andrey to participate in PR free of any psychosocial barriers or concerns For Rourke to participate in PR free of any psychosocial barriers or concerns     Interventions Encouraged to attend Pulmonary Rehabilitation for the exercise Encouraged to attend Pulmonary Rehabilitation for the exercise Encouraged to attend Pulmonary Rehabilitation for the exercise     Continue Psychosocial Services  No Follow up required No Follow up required No Follow up required              Psychosocial Discharge (Final Psychosocial Re-Evaluation):  Psychosocial Re-Evaluation - 12/08/23 0937       Psychosocial Re-Evaluation   Current issues with None Identified    Comments Gad continues to deny any psychosocial barriers or concerns at this time.    Expected Outcomes For Michelle to participate in PR free of any psychosocial barriers or concerns    Interventions Encouraged to attend Pulmonary Rehabilitation for the exercise    Continue Psychosocial Services  No Follow up required             Education: Education Goals: Education classes will be provided on a weekly basis, covering required topics. Participant will state understanding/return demonstration of topics presented.  Learning Barriers/Preferences:  Learning Barriers/Preferences - 10/01/23 0932  Learning Barriers/Preferences   Learning Barriers None    Learning Preferences Written Material;Verbal Instruction;Group Instruction;Individual Instruction             Education Topics: Know Your Numbers Group instruction that is  supported by a PowerPoint presentation. Instructor discusses importance of knowing and understanding resting, exercise, and post-exercise oxygen  saturation, heart rate, and blood pressure. Oxygen  saturation, heart rate, blood pressure, rating of perceived exertion, and dyspnea are reviewed along with a normal range for these values.  Flowsheet Row PULMONARY REHAB OTHER RESPIRATORY from 10/07/2023 in [facility]  Date 10/07/23  Educator EP  Instruction Review Code 1- Verbalizes Understanding       Exercise for the Pulmonary Patient Group instruction that is supported by a PowerPoint presentation. Instructor discusses benefits of exercise, core components of exercise, frequency, duration, and intensity of an exercise routine, importance of utilizing pulse oximetry during exercise, safety while exercising, and options of places to exercise outside of rehab.    MET Level  Group instruction provided by PowerPoint, verbal discussion, and written material to support subject matter. Instructor reviews what METs are and how to increase METs.    Pulmonary Medications Verbally interactive group education provided by instructor with focus on inhaled medications and proper administration.   Anatomy and Physiology of the Respiratory System Group instruction provided by PowerPoint, verbal discussion, and written material to support subject matter. Instructor reviews respiratory cycle and anatomical components of the respiratory system and their functions. Instructor also reviews differences in obstructive and restrictive respiratory diseases with examples of each.  Flowsheet Row PULMONARY REHAB OTHER RESPIRATORY from 12/09/2023 in [facility], [facility], [facility]  Date 12/09/23  Educator RT  Instruction Review Code 1- Verbalizes Understanding       Oxygen  Safety Group instruction provided by PowerPoint,  verbal discussion, and written material to support subject matter. There is an overview of "What is Oxygen " and "Why do we need it".  Instructor also reviews how to create a safe environment for oxygen  use, the importance of using oxygen  as prescribed, and the risks of noncompliance. There is a brief discussion on traveling with oxygen  and resources the patient may utilize. Flowsheet Row PULMONARY REHAB OTHER RESPIRATORY from 10/14/2023 in [facility]  Date 10/14/23  Educator RN  Instruction Review Code 1- Verbalizes Understanding       Oxygen  Use Group instruction provided by PowerPoint, verbal discussion, and written material to discuss how supplemental oxygen  is prescribed and different types of oxygen  supply systems. Resources for more information are provided.  Flowsheet Row PULMONARY REHAB OTHER RESPIRATORY from 10/21/2023 in [facility], Vascular, [facility]  Date 10/21/23  Educator RT  Instruction Review Code 1- Verbalizes Understanding       Breathing Techniques Group instruction that is supported by demonstration and informational handouts. Instructor discusses the benefits of pursed lip and diaphragmatic breathing and detailed demonstration on how to perform both.  Flowsheet Row PULMONARY REHAB OTHER RESPIRATORY from 10/28/2023 in [facility], Vascular, [facility]  Date 10/28/23  Educator RN  Instruction Review Code 1- Verbalizes Understanding        Risk Factor Reduction Group instruction that is supported by a PowerPoint presentation. Instructor discusses the definition of a risk factor, different risk factors for pulmonary disease, and how the heart and lungs  work together. Flowsheet Row PULMONARY REHAB OTHER RESPIRATORY from 11/18/2023 in [facility] for Heart, Vascular, & Lung Health  Date 11/18/23  Educator EP   Instruction Review Code 1- Verbalizes Understanding       Pulmonary Diseases Group instruction provided by PowerPoint, verbal discussion, and written material to support subject matter. Instructor gives an overview of the different type of pulmonary diseases. There is also a discussion on risk factors and symptoms as well as ways to manage the diseases. Flowsheet Row PULMONARY REHAB OTHER RESPIRATORY from 12/02/2023 in [facility] for [facility], Vascular, [facility]  Date 12/02/23  Educator RT  Instruction Review Code 1- Verbalizes Understanding       Stress and Energy Conservation Group instruction provided by PowerPoint, verbal discussion, and written material to support subject matter. Instructor gives an overview of stress and the impact it can have on the body. Instructor also reviews ways to reduce stress. There is also a discussion on energy conservation and ways to conserve energy throughout the day.   Warning Signs and Symptoms Group instruction provided by PowerPoint, verbal discussion, and written material to support subject matter. Instructor reviews warning signs and symptoms of stroke, heart attack, cold and flu. Instructor also reviews ways to prevent the spread of infection. Flowsheet Row PULMONARY REHAB OTHER RESPIRATORY from 11/11/2023 in [facility] for Heart, Vascular, & Lung Health  Date 11/11/23  Educator RN  Instruction Review Code 1- Verbalizes Understanding       Other Education Group or individual verbal, written, or video instructions that support the educational goals of the pulmonary rehab program.    Knowledge Questionnaire Score:  Knowledge Questionnaire Score - 10/01/23 1022       Knowledge Questionnaire Score   Pre Score 18/18             Core Components/Risk Factors/Patient Goals at Admission:  Personal Goals and Risk Factors at Admission - 10/01/23 0932       Core Components/Risk  Factors/Patient Goals on Admission   Improve shortness of breath with ADL's Yes    Intervention Provide education, individualized exercise plan and daily activity instruction to help decrease symptoms of SOB with activities of daily living.    Expected Outcomes Short Term: Improve cardiorespiratory fitness to achieve a reduction of symptoms when performing ADLs;Long Term: Be able to perform more ADLs without symptoms or delay the onset of symptoms             Core Components/Risk Factors/Patient Goals Review:   Goals and Risk Factor Review     Row Name 10/13/23 1119 11/10/23 1030 12/08/23 0938         Core Components/Risk Factors/Patient Goals Review   Personal Goals Review Improve shortness of breath with ADL's;Develop more efficient breathing techniques such as purse lipped breathing and diaphragmatic breathing and practicing self-pacing with activity.;Increase knowledge of respiratory medications and ability to use respiratory devices properly. Improve shortness of breath with ADL's;Develop more efficient breathing techniques such as purse lipped breathing and diaphragmatic breathing and practicing self-pacing with activity.;Increase knowledge of respiratory medications and ability to use respiratory devices properly. Improve shortness of breath with ADL's;Increase knowledge of respiratory medications and ability to use respiratory devices properly.     Review Monthly review of patient's Core Components/Risk Factors/Patient Goals are as follows: Goal progressing for improving shortness of breath with ADL's. Chip is currently using 6L O2 to maintain sats >88% while exercising. He is currently exercising  on the treadmill and the bike. Goal progressing for developing more efficient breathing techniques such as purse lipped breathing and diaphragmatic breathing; and practicing self-pacing with activity. Goal progressing for increasing knowledge of respiratory medications and the ability to use  respiratroy devices properly. We will continue to monitor Dervin progress throughout the program. Monthly review of patient's Core Components/Risk Factors/Patient Goals are as follows: Goal progressing for improving shortness of breath with ADL's. Armend is currently using 4-6L O2 to maintain sats >88% while exercising. He is currently exercising on the treadmill and the bike. Goal met for developing more efficient breathing techniques such as purse lipped breathing and diaphragmatic breathing; and practicing self-pacing with activity. He has attended the breathing technique education and has been practicing diaphragmatic breathing at home. He is able to demonstrate purse lip breathing when he gets SOB and knows how to self pace based on the RPE/dyspnea scale. Goal progressing for increasing knowledge of respiratory medications and the ability to use respiratroy devices properly. We will continue to monitor Rishard progress throughout the program. Monthly review of patient's Core Components/Risk Factors/Patient Goals are as follows: Goal progressing for improving shortness of breath with ADL's. [place] is currently using 4-6L O2 to maintain sats >88% while exercising. He is currently exercising on the treadmill and the bike. Goal progressing for increasing knowledge of respiratory medications and the ability to use respiratroy devices properly. We will continue to monitor Yahel's progress throughout the program.     Expected Outcomes To improve shortness of breath with ADL's, develop more efficient breathing techniques such as purse lipped breathing and diaphragmatic breathing; and practicing self-pacing with activity and  increase knowledge of respiratory medications and the ability to use respiratroy devices properly. To improve shortness of breath with ADL's and  increase knowledge of respiratory medications and the ability to use respiratroy devices properly. To improve shortness of breath with ADL's and  increase  knowledge of respiratory medications and the ability to use respiratroy devices properly.              Core Components/Risk Factors/Patient Goals at Discharge (Final Review):   Goals and Risk Factor Review - 12/08/23 0938       Core Components/Risk Factors/Patient Goals Review   Personal Goals Review Improve shortness of breath with ADL's;Increase knowledge of respiratory medications and ability to use respiratory devices properly.    Review Monthly review of patient's Core Components/Risk Factors/Patient Goals are as follows: Goal progressing for improving shortness of breath with ADL's. Mccall is currently using 4-6L O2 to maintain sats >88% while exercising. He is currently exercising on the treadmill and the bike. Goal progressing for increasing knowledge of respiratory medications and the ability to use respiratroy devices properly. We will continue to monitor [place]'s progress throughout the program.    Expected Outcomes To improve shortness of breath with ADL's and  increase knowledge of respiratory medications and the ability to use respiratroy devices properly.             ITP Comments:Pt is making expected progress toward Pulmonary Rehab goals after completing 19 session(s). Recommend continued exercise, life style modification, education, and utilization of breathing techniques to increase stamina and strength, while also decreasing shortness of breath with exertion.  Dr. Genetta Kenning is Medical Director for Pulmonary Rehab at [facility].

## 2023-12-16 ENCOUNTER — Encounter (HOSPITAL_COMMUNITY)
Admission: RE | Admit: 2023-12-16 | Discharge: 2023-12-16 | Disposition: A | Discharge status: Home / Self Care | Source: Ambulatory Visit | Attending: Pulmonary Disease | Admitting: Pulmonary Disease

## 2023-12-16 DIAGNOSIS — J84112 Idiopathic pulmonary fibrosis: Secondary | ICD-10-CM | POA: Diagnosis not present

## 2023-12-16 NOTE — Progress Notes (Signed)
 Daily Session Note  Patient Details  Name: GEARLD KERSTEIN MRN: [id number] Date of Birth: Aug 22, 1940 Referring Provider:   [name] Pulmonary Rehab Walk Test from 10/01/2023 in [facility]  Referring Provider [name]  [[name]]    Encounter Date: 12/16/2023  Check In:  Session Check In - 12/16/23 1029       Check-In   Supervising physician immediately available to respond to emergencies [facility] MD immediately available    Physician(s) Slater Duncan, NP    Location [facility]    Staff Present Sueellen Emery BS, ACSM-CEP, Exercise Physiologist;Kaylee Nolon Baxter, MS, ACSM-CEP, Exercise Physiologist;Sherol Sabas Vernadine Golas Belarus, RD, Toy Freund, RN, BSN    Virtual Visit No    Medication changes reported     No    Fall or balance concerns reported    No    Tobacco Cessation No Change    Warm-up and Cool-down Performed as group-led instruction    Resistance Training Performed Yes    VAD Patient? No    PAD/SET Patient? No      Pain Assessment   Currently in Pain? No/denies          Capillary Blood Glucose: No results found for this or any previous visit (from the past 24 hours).    Social History   Tobacco Use  Smoking Status Never  Smokeless Tobacco Never    Goals Met:  Proper associated with RPD/PD & O2 Sat Independence with exercise equipment Exercise tolerated well No report of concerns or symptoms today Strength training completed today  Goals Unmet:  Not Applicable  Comments: Service time is from 1008 to 1138.    Dr. Genetta Kenning is Medical Director for Pulmonary Rehab at [facility].

## 2023-12-17 ENCOUNTER — Ambulatory Visit (HOSPITAL_COMMUNITY)
Admission: RE | Admit: 2023-12-17 | Discharge: 2023-12-17 | Disposition: A | Discharge status: Home / Self Care | Source: Ambulatory Visit | Attending: Cardiology | Admitting: Cardiology

## 2023-12-17 ENCOUNTER — Telehealth: Payer: Self-pay | Admitting: Internal Medicine

## 2023-12-17 ENCOUNTER — Encounter (HOSPITAL_COMMUNITY): Payer: Self-pay | Admitting: Cardiology

## 2023-12-17 ENCOUNTER — Ambulatory Visit: Admitting: Internal Medicine

## 2023-12-17 ENCOUNTER — Ambulatory Visit (HOSPITAL_COMMUNITY): Payer: Self-pay | Admitting: Cardiology

## 2023-12-17 ENCOUNTER — Encounter: Payer: Self-pay | Admitting: Internal Medicine

## 2023-12-17 VITALS — BP 102/62 | HR 80 | Ht 69.0 in | Wt 164.4 lb

## 2023-12-17 VITALS — BP 110/60 | HR 81 | Temp 98.5°F | Ht 69.0 in | Wt 163.2 lb

## 2023-12-17 DIAGNOSIS — Z8616 Personal history of COVID-19: Secondary | ICD-10-CM

## 2023-12-17 DIAGNOSIS — R5383 Other fatigue: Secondary | ICD-10-CM | POA: Diagnosis not present

## 2023-12-17 DIAGNOSIS — J84112 Idiopathic pulmonary fibrosis: Secondary | ICD-10-CM | POA: Diagnosis not present

## 2023-12-17 DIAGNOSIS — R0609 Other forms of dyspnea: Secondary | ICD-10-CM

## 2023-12-17 DIAGNOSIS — R000 Tachycardia, unspecified: Secondary | ICD-10-CM | POA: Insufficient documentation

## 2023-12-17 DIAGNOSIS — R110 Nausea: Secondary | ICD-10-CM

## 2023-12-17 DIAGNOSIS — Z5181 Encounter for therapeutic drug level monitoring: Secondary | ICD-10-CM

## 2023-12-17 DIAGNOSIS — G4733 Obstructive sleep apnea (adult) (pediatric): Secondary | ICD-10-CM | POA: Insufficient documentation

## 2023-12-17 DIAGNOSIS — I5022 Chronic systolic (congestive) heart failure: Secondary | ICD-10-CM | POA: Diagnosis not present

## 2023-12-17 DIAGNOSIS — I700 Atherosclerosis of aorta: Secondary | ICD-10-CM | POA: Insufficient documentation

## 2023-12-17 DIAGNOSIS — I350 Nonrheumatic aortic (valve) stenosis: Secondary | ICD-10-CM | POA: Diagnosis not present

## 2023-12-17 DIAGNOSIS — Z79899 Other long term (current) drug therapy: Secondary | ICD-10-CM | POA: Diagnosis not present

## 2023-12-17 DIAGNOSIS — I493 Ventricular premature depolarization: Secondary | ICD-10-CM | POA: Diagnosis not present

## 2023-12-17 DIAGNOSIS — R198 Other specified symptoms and signs involving the digestive system and abdomen: Secondary | ICD-10-CM

## 2023-12-17 DIAGNOSIS — I2510 Atherosclerotic heart disease of native coronary artery without angina pectoris: Secondary | ICD-10-CM | POA: Insufficient documentation

## 2023-12-17 LAB — PULMONARY FUNCTION TEST
DL/VA % pred: 100 %
DL/VA: 3.91 ml/min/mmHg/L
DLCO cor % pred: 50 %
DLCO cor: 11.41 ml/min/mmHg
DLCO unc % pred: 46 %
DLCO unc: 10.56 ml/min/mmHg
FEF 25-75 Pre: 1.44 L/s
FEF2575-%Pred-Pre: 84 %
FEV1-%Pred-Pre: 53 %
FEV1-Pre: 1.36 L
FEV1FVC-%Pred-Pre: 112 %
FEV6-%Pred-Pre: 50 %
FEV6-Pre: 1.7 L
FEV6FVC-%Pred-Pre: 107 %
FVC-%Pred-Pre: 46 %
FVC-Pre: 1.7 L
Pre FEV1/FVC ratio: 80 %
Pre FEV6/FVC Ratio: 100 %

## 2023-12-17 LAB — LIPID PANEL
Cholesterol: 132 mg/dL (ref 0–200)
HDL: 44 mg/dL (ref >40)
LDL Cholesterol: 66 mg/dL (ref 0–99)
Total CHOL/HDL Ratio: 3 ratio
Triglycerides: 111 mg/dL (ref <150)
VLDL: 22 mg/dL (ref 0–40)

## 2023-12-17 NOTE — Progress Notes (Signed)
 Spirometry/DLCO performed today.

## 2023-12-17 NOTE — Progress Notes (Signed)
 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 He tells me that this summer 2018*noticing insidious onset of shortness of breath particularly with walking stairs in the house or [place]. This was not that in the previous years. The summer 2018 he was in [place] but did not notice much shortness of breath. Otherwise he is able to do his activities of daily living and played golf with a car. He is more bothered by his back pain and sciatica as a result of previous back surgery. Shortness of breath is extremely mild. He says he became more aware of it after the radiologic investigations documented below. He had a chest x-ray that suggested interstitial findings and therefore he underwent a high-resolution CT scan of the chest that is described below. I personally visualized this high resolution CT chest and to me it shows bilateral bibasal subpleural reticulation that also extends to the upper lobes without any zonal predominance. There is no obvious honeycombing but there is traction bronchiectasis. There no mediastinal adenopathy. Therefore he is been referred here. In terms of exposure history other than radiation exposure he has not been on any pulmonary toxic drugs or any mold exposure or asbestos exposure. Does have GERD and is on PPI and is well ocntrolled   Does have spring and perennial allergies - sneezes easy. Allergy  test with [facility] negative some years ago  [employer] chest physicians interstitial lung disease questionnaire: He says that he does not cough. He is only trouble with dyspnea with strenuous exercise and it started 4 months ago. Past medical history significant for acid reflux. He has never smoked any tobacco or street drugs. Does not have any family history of lung disease.  At home he does not have any humidifier sound birds heart the water damage or mold. 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 He has standard radiation exposure while at work. The standard radiation for a radiologist. He does not take any pulmonary toxic drugs. House is 82 years old and he worries about mold. He sneezes if air is forced   Pulmonary function test shows isolated reduction in diffusion capacity to 62%. Walking desaturation test underneath her feet 3 laps on room air: Resting pulse ox 96%. Final pulse ox 95%. Heart rate resting was 92/m and went up to 109 minute.   Results for MALIKYE, REPPOND (MRN [id number]) as of 03/23/2017 10:04  Ref. Range 03/04/2017 09:13  FVC-Pre Latest Units: L 3.59  FVC-%Pred-Pre Latest Units: % 86  Results for TONI, HOFFMEISTER (MRN [id number]) as of 03/23/2017 10:04  Ref. Range 03/04/2017 09:13  DLCO cor Latest Units: ml/min/mmHg 20.06  DLCO cor % pred Latest Units: % 63    Lungs/Pleura: There is subpleural reticulation with scattered ground-glass and traction bronchiolectasis, without a definite zonal predominance. No definitive honeycombing. Findings appear mildly progressive when radiographs dating back to 12/28/2008 are reviewed. Probable 5 mm subpleural lymph node along the right major fissure. No pleural fluid. Airway is unremarkable.      IMPRESSION: 1. Pulmonary parenchymal findings of interstitial lung disease which may be due to fibrotic nonspecific interstitial pneumonitis or, given slight progression over time, usual interstitial pneumonitis. 2. Aortic atherosclerosis (ICD10-170.0). Coronary artery calcification. 3. Aortic aneurysm NOS (  ICD10-I71.9). 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 Recommend annual imaging followup by CTA or MRA. This recommendation follows 2010 ACCF/AHA/AATS/ACR/ASA/SCA/SCAI/SIR/STS/SVM Guidelines for the Diagnosis and Management of Patients with Thoracic  Aortic Disease. Circulation. 2010; 121: [id number]. 4. Prominence of the right and left main pulmonary arteries can be seen with pulmonary arterial hypertension.     Electronically Signed   By: Shearon Denis M.D.   On: 02/17/2017 13:41      OV 03/23/2017  Chief Complaint  Patient presents with   Follow-up    SOB on exertion. Other than that he has been doing about the same. Denies any cough or CP.   Here to discuss results. Wife Deadra Everts here. 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 In inteirm no new symptoms.  Autoimmune profile negative. They raise possibility of 2nd opinion.  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 Progression is based on CXR comparison since 2010; there is no CT and is only suspicion of progression OV 05/06/2017  Chief Complaint  Patient presents with   Follow-up    Follow up today after beginning Esbriet . Pt has no c/o SOB, cough, or CP. Has had some mild nausea from the Esbriet  but other than that, he has been doing good on it.   ollow-up idiopathic pulmonary fibrosis mild severity  He is here to follow-up He is on the third week of his bed at 3 pills 3 times a day max dose. So far is tolerating it fine other than mild nausea. He does take after meals. He plans to meet with [facility] coordinator for medication support. He did visit [place]  this past weekend and did have vomiting especially after consuming few etoh drinks at a [employer]. He does apply sunscreen [his daughter is a Armed forces operational officer in town]. He still complains about the fact that and is 82 year old home when the air conditioner comes on and they air blows out of the duct system he does sneeze 10 times and his feet does feel cold. She plans to put hepa filters that are high and and also get the duct cleaning. He says he did have allergy  evaluation and found few years ago and it was negative. Recently he has  increased his rpinrole and alsogot an injection for his back and he does feel better   OV 06/22/2017  Chief Complaint  Patient presents with   Follow-up    Pt still taking Esbriet  and has been doing good except has been having issues with nausea x3 days. PFT done today. Still becomes SOB with exertion. Denies any complaints of cough or CP.   IPF followup  Routine followup. Now on esbriet  3 pills tid since end oct 2018. Having new nausea - moderate, x 3 days. Might be chills. But no associated diarrhea or other side effects. Spacing esbriet  4h only. Also has complaitns about high co pay. Unable to find charity. Doing exrcise with trainer - feeels that is better and more vigorous than rehab. Gets tachy with eercise but says he does not desaturate. Had PFT - stable. Lot of questions about disease    Walking desaturation test on 06/22/2017 185 feet x 3 laps on ROOM AIR:  did NOT desaturate. Rest pulse ox was 100%, final pulse ox was 99%. HR response 84/min at rest to 97/min at peak exertion. Patient SAKSHAM AKKERMAN  Did not Desaturate < 88% . Alvaro Augusta did  not  Desaturated </= 3% points. Jenine Mix A Hunkele yes did get tachyardic   OV 09/20/2017  Chief Complaint  Patient presents with   Follow-up    PFT done today.  Pt states he has been doing good. Pt still taking Esbriet  and states he is doing good on it.   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  Coretta Dexter for IPF follow-up.  Last visit December 2018.  Since then he has been compliant fully with his Pirfenidone  (Esbriet ).  His nausea resolved with Ginger intake.  However he is having 3-5 pound weight loss and also fatigue towards the end of the day and also low appetite.  He is exercising vigorously 5 times a week and he is wondering if the fatigue could be because of the heavy exercise.  He is not interested in lung transplantation.  He is open to research participation in interventional trial.  Currently he is on the IPF registry program.  Overall he is  well.  He did go to [place] to visit his son and he did not wear sunscreen and he did pick up a stage I skin burn with erythema that was more than usual.  But this resolved.  I did remind him about his obligation to wear sunscreen at all times with Pirfenidone  (Esbriet ).  He is interested in interventional research trials.  His pulmonary function test to me on an average is stable even though there is decline in FVC that seems to be an increase in DLCO so that is variability.  His walking desaturation test is around the same. He is interested in rolling over to the larger capsule of the Pirfenidone  (Esbriet ) so we will have to take it only 3 times a day. [employer]berit is cposting him $500/month or more due to high AGI and this is frustrating for him   Walking desaturation test on 09/20/2017 185 feet x 3 laps on ROOM AIR:  did walk briskly all 3 laps with only mild dyspnea desaturate. Rest pulse ox was 98%, final pulse ox was 98%. HR response 96/min at rest to 107/min at peak exertion. Patient CREEK GAN  Did not Desaturate < 88% . Alvaro Augusta did not  Desaturated </= 3% points. Kristin Peyer Costales yes did get tachyardic this appears similar to a few months ago   OV 12/07/2017  Chief Complaint  Patient presents with   Follow-up    Pt had pre Spiro and DLCO prior to OV today. Pt has increase of SOB with exertion more with walking up the hill.   Dr. Linder Revere presents for follow-up of his IPF to ILD clinic.  In terms of his IPF he feels stable.  In fact he tells me that if not for the incidental chest x-ray and a subsequent CT scan that picked up pulmonary fibrosis he even to this day will not know that he has IPF.  He only has a mild exertional dyspnea class I which she always believes in the past that it was because of aging.  At this point in time he is on Esbriet  1 big pill 3 times daily at full dose.  He is now applying sunscreen and has not had any further sunburns.  He is following an extremely intense exercise  fitness regimen to improve his cardiac conditioning.  He believes his exertional heart rate and resting heart rate are much improved than before.  He works with a Psychologist, educational at [employer].  His main issue is from Pirfenidone  (Esbriet ).  He had some  mild nausea that has now resolved with ginger capsules but he continues to have some amount of fatigue low appetite and some mild weight loss.  He says this is mild and all tolerable.  The fatigue happens after working a lot in the yard and he loves yard work.  He wants to know if he can continue to do yard work.  Recently he did have some sinus congestion and took some steroids and antibiotics for it and he feels better but he still has some sinus fullness.  He had spirometry and simple walking desaturation test and the profile is below and I believe these are stable.  In fact his exertional heart rate is improved compared to previous.  Today he also participated in the IPF registry program.  There is a noninterventional trial.  He is interested in future interventional trials.   OV 03/08/2018  Chief Complaint  Patient presents with   Follow-up    PFT performed today. Pt states he has been doing well except he has been having problems with nausea and dizziness, and loss of appetite.   Alvaro Augusta - presents for follow-up. For his IPF. He is on esbruet now. He was tolerating his Pirfenidone  (Esbriet ) quite well other than minor side effects till last visit. However in thesummer of 2019e's had increased nauseaand also weight loss. He has lost 6 pounds since prior visit. Overall he is lost 11 pounds since September 2018. We started the medication esbeit  anti-fibrotic for him in October 2018. His pants are getting looser.he is also having significant fatigue. Nevertheless he does not want to switch to the of anti-fibrotic ofev because of prior history of irritable bowel syndrome. He says that his baseline irritable bowel syndrome is worse than any side effects  he is having with esbriet  especially the fatigue.e is open to a transplant evaluation and consideration of research protocols. His spirometry shows stability with FVC but his DLCO might be declining.Aaron Aas His simple walk test did not show anychange. He is not noticing any subjective changes in dyspnea on effort tolerance while working out although he does admit to increased dizziness particularly when playing golf. During this time recently has not monitored his pulse ox but in the past and never went below 92%.  Other issues: He had some myalgia despite change in his statin. He will address this with his primary care. 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 Primary care has ordered a follow-up CT scan.This can be combined with a high risk CT scan. I I have reached out to  the thoracic radiology     OV 05/10/2018  Subjective:  Patient ID: Alvaro Augusta, male , DOB: [birth year] , age 23 y.o. , MRN: [id number] , ADDRESS: [address] [place] [address]   05/10/2018 -   Chief Complaint  Patient presents with   Follow-up    review spiro with dlco.  c/o stable doe.  On 801mg  Esbriet  TID, notes occ dizziness, loss of appetite.      HPI AKING KLABUNDE 83 y.o. -returns-year follow-up.  Diagnosis made towards the end of 2018.  He has been on Esbriet  since then.  His main issues have been weight loss.  He lost 10 pounds of weight at the time of last visit.  At the last visit he weighed 174 pounds in September 2019.  Back in August 2018 he weighed 186 pounds.  All this weight loss happen after he started Pirfenidone  (Esbriet ).  However he is now stabilized and he continues to be 874 pounds.  He is able to do activities of daily living and exercise but when he does stairs he gets dyspneic.  Overall he feels stable in the last 1 year in terms of his effort tolerance.  His main thing is that he has no appetite.  He has been communicating with the Pirfenidone  (Esbriet ) supporting about how to  manage his low appetite and occasional nausea.  We discussed the options about taking brief holidays or reduction in dose.  He is willing to try this.  He does take occasional ginger.  Of note he had a transplant referral set up by myself or [facility] but as our injection letter because he was out of network.  He tells me that his insurance company only advised that he would have a higher co-pay so is a little bit puzzled by this.  Since then he said conversation with his primary care physician and he wants to see Dr. Tura Gaines who is a son-in-law to his friend Dr. Karenann Other retired Careers adviser.  I know Dr. Twila Gale quite well from an IPF standpoint.  And therefore wholeheartedly supported the idea.  In terms of his lung function testing his FVC shows a decline although he is feeling the same.  On miniature walking test he seems a little more tachycardic but he says he feels the same.  He manages this heart rate with his exercise.  His DLCO with some variability appears stable.  He had a CT angiogram for thoracic aortic aneurysm in September 2019 and this when compared to one year earlier has been reported as stable but the different resolutions.  I offered to do a high-resolution CT chest here but he wants to hold off until he saw Dr. Twila Gale.  OV 08/23/2018  Subjective:  Patient ID: Alvaro Augusta, male , DOB: [birth year] , age 39 y.o. , MRN: [id number] , ADDRESS: [address] [place] [address]   08/23/2018 -   Chief Complaint  Patient presents with   Follow-up    PFT performed today. Pt states he has been doing well since last visit and denies any complaints.   IPF followup   Diagnosis made towards the end of 2018.  He has been on Esbriet  since then.   HPI KAIL FRALEY 83 y.o. 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  He continues to feel good.  His main issue is tolerating the Esbriet  which gives some fatigue.  Sometimes he has to postpone it.  In terms of his dyspnea he  continues to exercise.  His dyspnea symptom score is really minimal.  His cough is nonexistent.  He was losing weight with Esbriet  but now he is gained his weight back.  In the interim he did see Dr. Tura Gaines June 23, 2018 at [facility].  He had a satisfactory visit.  He thought he was stable.  His next visit with Dr. Twila Gale will be in June 2020.  Moving forward he wants to alternate every 6 months between myself and Dr. Twila Gale at [facility] which will give him 3 months with 1 pulmonologist or the other.  He is beginning to get interested in research studies.  We did pulmonary function test on him today and is documented below.  The FVC appears a fluctuant but the DLCO definitely appears on a downward trend.  The new DLCO is on a global lung initiated [GL I] equation and therefore the  percent predicted is higher.  But the absolute value showed downward trend line.  We went over this.  He definitely does not feel this subjectively.  His last high-resolution CT scan of the chest was in August of 2018.  He is willing to have one at follow-up.  He wants to see me again in 6 months.  His simple walking desaturation test appears stable.  He is thinking of making a trip to [place]  and wants to know if he should wear a mask.    OV 04/21/2019  Subjective:  Patient ID: Alvaro Augusta, male , DOB: [birth year] , age 51 y.o. , MRN: [id number] , ADDRESS: [address] [place] [address]   04/21/2019 -   Chief Complaint  Patient presents with   Follow-up    Patient reports that his breathing is doing well at this time.    IPF followup. On esbriet   HPI ANTINO MAYABB 83 y.o. -presents for follow-up of his idiopathic pulmonary fibrosis.  Last visit was pre-pandemic in February 2018.  In the interim he did see Dr. Tura Gaines at [facility] and deemed to be stable.  At this point in time he is reporting continued stability with the symptoms as seen by the score below.  However I did notice  that he is lost lot of weight.  His BMI with his clothes on is 25 with a weight of 170 pounds.  He says his dry weight is actually around 162 pounds.  He feels that this is the stable weight in the last 6 months.  He says he is not bothered by it.  He says this is because of low appetite because of the and pirfenidone .  He feels the pirfenidone  is benefiting him and he continues to be is to be stable.  In fact his pulmonary function test shows  improvement.  He said that he felt the test today was somewhat variable and is effort based on the technician.  He had a recent high-resolution CT chest that shows continued stability.  I personally visualized this film.  At this point in time he is happy taking the pirfenidone .  But he did agree if there is further weight loss into the 150s pounds then we could reassess  Had a lot of discussion about COVID-19 and about appropriate risk reduction measures which he is continuing to do.  He did some read some literature about COVID-19 and had questions about it. He in Vit D supplementation and is agreeable to get his levels checked following literature about protective effect of Vit D  Other than that he reports 2 episodes of dizziness and in one of those he nearly passed out.  He was wondering if some of the symptoms were related to ropinirole  and therefore he has stopped it.  I reviewed the literature with him and the significant amount of dizziness and hypotension.  And also GI symptoms which can probably accentuated with pirfenidone .   OV 10/16/2019  Subjective:  Patient ID: Alvaro Augusta, male , DOB: 23-Nov-1940 , age 37 y.o. , MRN: [id number] , ADDRESS: [address] [place] [address]   10/16/2019 -   Chief Complaint  Patient presents with   Follow-up    PFT performed 3/31.  Pt states he is doing okay. States his breathing is about the same since last visit.     HPI IZAIA SAY 83 y.o.  -follow-up IPF on pirfenidone     -ROS   63-month  follow-up for Dr. Linder Revere.  He continues on pirfenidone .  He is taking full dose.  In the interim he has had hernia surgery 6 weeks ago for right inguinal hernia.  This is gone well.  He is change his exercise from bicycle to walking because of the hernia.  His symptom score is the same.  In January 2021 he saw Dr. Twila Gale at [facility] for IPF follow-up.  Deemed to be stable.  I reviewed that note.  After that he has had pulmonary function test with us  couple weeks ago.  This shows a decline.  He says the quality of instructions was poor and therefore the decline is because of that.  At least that is his suspicion.  He feels well.  Walking desaturation test is stable.  He has gained some weight.  He continues to have some nausea and fatigue and diarrhea with the pirfenidone .  But this is stable and manageable.  He feels his irritable bowel syndrome is active because of the pirfenidone .  He plans to see Dr. Levada Raymond for that.  His last liver function test was with Dr. Twila Gale in January 2021.  This was normal per the note.  His last CT scan was towards the end of 2020 and deemed to be stable.     OV 04/11/2020   Subjective:  Patient ID: Alvaro Augusta, male , DOB: 1940/10/21, age 88 y.o. years. , MRN: [id number],  ADDRESS: [address] [place] [address] PCP  Aldo Hun, MD Providers : Dr Letitia Ravens  Treatment Team:  Attending Provider: Maire Scot, MD   Chief Complaint  Patient presents with   Follow-up    PFT performed today.  Pt states he has been doing well since last visit and denies any complaints.    Follow-up idiopathic pulmonary fibrosis.  Diagnosis towards the end of 2018.  On pirfenidone .  Last CT scan of the chest September 2020.   HPI ANDREE GOLPHIN 82 y.o. -presents for follow-up last seen in April 2021.  After that in July 2021 he visited with Dr. Tura Gaines at [facility].  I reviewed the notes.  The feeling was that he was stable.  He is  only minimally symptomatic.  He had a 6-minute walk test and did really well without any significant desaturations.  He is now resorted to high intensity interval training based on advice from a physical therapist/gym instructor.  He feels like stronger and failure.  He also states that his appetite is better and is eating better.  He feels his weight is stable.  In terms of his IPF symptoms is minimal and documented below.  He just has mild irritable bowel syndrome with his Esbriet  and baseline health.  His walking desaturation test today in the office is stable.  His pulmonary function test was reviewed.  Over the last 3 years it shows a slight steady decline.  This decline is both in FVC and DLCO.  He is averaging around 2.6% decline per year with FVC while 8% decline in the DLCO per year.  On an average with a 3% decline per year in the last 3 years.  We did discuss this.  We discussed #pulmonary fibrosis foundation support group -he will visit the website.  He gets newsletters from them.  At this point in time he is not interested in joining the support group but he did get the contact information for this and will reach out if he desires so.  #-We  also discussed research as a care option -we discussed trial sponsored by [place] [employer] psychotherapist of pirfenidone .  The trial is called [employer].  The investigation medical product is called PRM-151 in the axilla macrophage pathway.  Promising phase 2 data.  Research team gave him the consent form under my delegation.  He understands research is voluntary and is to evaluate unproven therapies.  He understands that the primary intent is to contribute to her scientific development and therapeutic gain is secondary because of an approved product the risks benefit ratio is very different compared to blood products.  He is going to read the consent and think about potential participation.  I sent him via email medical journal articles about the investigational  product    He tells me that he feels his weight is stable.  His dry weight early in the morning is between 162-163 pounds.  I pointed out that his weight today is 168 pounds and is lower than before.  However he says that the difference rating 168-172 is related to clothing and his weight at home is stable.  He is not concerned about the slight weight loss.  Overall while he agrees he is lost weight since starting Esbriet  he feels recently has been stable.  He wants to continue to monitor the situation  Irritable bowel: He said he will check with Dr. Elvin Hammer about this  Other issues:  - He wants me to check his iron panel -He will do follow-up of his IPF-registry visit today  -for research-  -He has had his Covid booster    OV 10/22/2020  Subjective:  Patient ID: Alvaro Augusta, male , DOB: 13-Aug-1940 , age 61 y.o. , MRN: [id number] , ADDRESS: [address] [place] [address] PCP Aldo Hun, MD Patient Care Team: Aldo Hun, MD as PCP - General (Internal Medicine) Judyth Nunnery, RN as Oncology Nurse Navigator  This Provider for this visit: Treatment Team:  Attending Provider: Maire Scot, MD    10/22/2020 -   Chief Complaint  Patient presents with   Follow-up    Fatigue from radiation treatments, more winded and more GI upset from radiation treatments    Follow-up idiopathic pulmonary fibrosis.  Diagnosis towards the end of 2018.  On pirfenidone .  Last CT scan of the chest September 2020.    HPI PONCE SKILLMAN 83 y.o. -returns for follow-up.  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  He is finished hormonal treatment and now is on radiation treatment.  Radiation treatment is aggravating his GI side effects from Pirfenidone  (Esbriet ) and from irritable bowel syndrome.  Is also causing fatigue.  He went August a masters in walk the tournament.  This made him more fatigued than what he experienced few years ago.  He said it took him a few days to recover.  He  used to have weight loss but this is now stabilized it is documented below.  His symptom severity score and his walking desaturation test are stable but his pulmonary function test continues to show decline.  His DLCO shows a relatively more decline but he said there were technique issues today.  He is got upcoming appointment with ILD clinic at [facility] in July.  At this point in time he is interested in phase 3 clinical trials.  We have quite a few to off as of summer 2022 but his prostate cancer could be an exclusion.  We will have the research team evaluate.  He is saddened  by the loss of his friend Dr. Marlow Sin.  He is evaluating whether he should join the pulmonary fibrosis foundation at the national level to be a board member.     OV 04/03/2021  Subjective:  Patient ID: Alvaro Augusta, male , DOB: 08-08-1940 , age 5 y.o. , MRN: [id number] , ADDRESS: [address] [place] [address] PCP Aldo Hun, MD Patient Care Team: Aldo Hun, MD as PCP - General (Internal Medicine) Judyth Nunnery, RN as Oncology Nurse Navigator  This Provider for this visit: Treatment Team:  Attending Provider: Maire Scot, MD    04/03/2021 -   Chief Complaint  Patient presents with   Follow-up    Just performed a partial PFT, patient states increased SOB.    Follow-up idiopathic pulmonary fibrosis.  Diagnosis towards the end of 2018.  On pirfenidone .  Last CT scan of the chest September 2020. - > sept 2022  Underlying irritable bowel syndrome  Initial weight loss with pirfenidone  and then stabilized  HPI Alvaro Augusta 83 y.o. 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  Since I last saw him he has had several health issues.  He got diagnosed with prostate cancer and then had radiation to that.  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  He is going to have a colonoscopy.  He feels his irritable bowel syndrome pirfenidone  and ultimately  the radiation made his GI symptoms significantly worse.  In order to combat the fatigue he has been undergoing physical therapy.  He also try to get back to golf and took a golf swing for practice and then states that he had a compression fracture in the spine.  This is probably also because of years of steroid injections in his back.  In addition when he had a high-resolution CT chest in September 2022 this month there was some neck nodes and he has had a neck CT scan followed by ENT visit to Dr. Tellis Feathers.  Apparently fine-needle aspiration was nondiagnostic.  He is scheduled to undergo excision biopsy.  The combination of all this is gotten a little bit more anxious and depressed.  As you can see his symptom scores for depression and anxiety are up.  He is also feeling a little more short of breath than usual.  He says his pulse ox is in the low 90s when he exerts himself but it still adequate.  Review of his symptoms and objective basis shows his symptom score to be the same.  His walking desaturation test is also the same.  His weight is stable.  His pulmonary function test shows stability in diffusion but a decline in FVC compared to earlier this year.  Had a high-resolution CT chest that only shows minimal progression fibrosis over 2 years.  Echocardiogram September 2022 did not show any evidence of pulmonary hypertension     CLINICAL DATA:  Pulmonary fibrosis.   EXAM: CT CHEST WITHOUT CONTRAST   TECHNIQUE: Multidetector CT imaging of the chest was performed following the standard protocol without intravenous contrast. High resolution imaging of the lungs, as well as inspiratory and expiratory imaging, was performed.   COMPARISON:  03/28/2019, 03/30/2018 and 02/27/2017. Bone scan 05/21/2020.   FINDINGS: Cardiovascular: Atherosclerotic calcification of the aorta, aortic valve and coronary arteries. Enlarged pulmonic trunk and right and left main pulmonary arteries. Heart is at the upper  limits of normal in size. No pericardial effusion.   Mediastinum/Nodes: Low left internal  jugular lymph nodes measure up to 8 mm (2/8), previously 3 mm on 03/28/2019. Mediastinal lymph nodes are subcentimeter in short axis size, as before. Hilar regions are difficult to definitively evaluate without IV contrast. Left axillary lymph nodes measure up to 9 mm, are unchanged and not considered enlarged by CT size criteria. High left subpectoral lymph node measures 7 mm (2/11), increased from 4 mm on 03/28/2019. No right axillary adenopathy. Esophagus is grossly unremarkable. Periesophageal lymph nodes are subcentimeter in short axis size and appears similar.   Lungs/Pleura: Peripheral and basilar predominant subpleural reticulation, ground-glass and traction bronchiectasis/bronchiolectasis, minimally progressive from 03/28/2019. Evidence of progression is best seen in the lower lung zones. No pleural fluid. Airway is unremarkable. No air trapping.   Upper Abdomen: Visualized portions of the liver, gallbladder, adrenal glands, kidneys, spleen, pancreas, stomach and bowel are unremarkable.   Musculoskeletal: Degenerative changes in the spine. There is a new small rounded sclerotic lesion in the lateral aspect of the right third rib (sagittal image 32). New lucent lesion in the left posterolateral aspect of the T8 vertebral body measuring approximately 1.3 x 1.7 x 2.1 cm (2/84 and sagittal image 96). This likely corresponds to abnormal uptake seen on bone scan 05/21/2020. Finding is new from 03/28/2019. Slight compression of the T12 superior endplate is new.   IMPRESSION: 1. Slight interval progression of pulmonary fibrosis. Findings are consistent with UIP per consensus guidelines: Diagnosis of Idiopathic Pulmonary Fibrosis: An Official ATS/ERS/JRS/ALAT Clinical Practice Guideline. Am Annie Barton Crit Care Med Vol 198, Iss 5, [phone], Mar 06 2017. 2. New small sclerotic lesion in the  lateral aspect of the right third rib, worrisome for metastatic prostate cancer. 3. Lucent lesion in the left posterolateral aspect of the T8 vertebral body corresponds to abnormal uptake on bone scan 05/21/2020, and is new from CT chest 03/28/2019, raising suspicion for malignancy. 4. Left internal and high left subpectoral lymph nodes are subcentimeter in size but appear slightly larger than on 03/28/2019. Difficult to exclude a lymphoproliferative disorder. Please refer to CT neck with contrast done the same day in further evaluation. 5. Slight compression of the T12 superior endplate is new but age indeterminate. 6. Aortic atherosclerosis (ICD10-I70.0). Coronary artery calcification. 7. Enlarged pulmonary arteries, indicative of pulmonary arterial hypertension.     Electronically Signed   By: Shearon Denis M.D.   On: 03/19/2021 15:12   Xxxxx  IMPRESSION: Neck CT September 2022 Mildly prominent lymph nodes in the neck bilaterally left greater than right. Cluster of left supraclavicular and left posterior lymph nodes all under 1 cm.   Largest lymph node in the neck is a right level 3 lymph node measuring 12 mm on axial image 75.     Electronically Signed   By: Anastasio Balsam M.D.   On: 03/19/2021 15:24   Xxxx IMPRESSIONS echocardiogram September 2022    1. Left ventricular ejection fraction, by estimation, is 65 to 70%. The  left ventricle has normal function. The left ventricle has no regional  wall motion abnormalities. Left ventricular diastolic parameters are  consistent with Grade I diastolic  dysfunction (impaired relaxation).   2. Right ventricular systolic function is normal. The right ventricular  size is normal. Tricuspid regurgitation signal is inadequate for assessing  PA pressure.   3. Left atrial size was mildly dilated.   4. The mitral valve is abnormal. Trivial mitral valve regurgitation. No  evidence of mitral stenosis.   5. The aortic valve is  tricuspid. There is mild calcification  of the  aortic valve. There is moderate thickening of the aortic valve. Aortic  valve regurgitation is not visualized. Mild aortic valve stenosis. Aortic  valve area, by VTI measures 1.62 cm.  Aortic valve mean gradient measures 12.0 mmHg. Aortic valve Vmax measures  2.31 m/s. DI is 0.43.   6. Aortic dilatation noted. There is borderline dilatation of the aortic  root, measuring 39 mm. There is borderline dilatation of the ascending  aorta, measuring 39 mm.   7. The inferior vena cava is normal in size with <50% respiratory  variability, suggesting right atrial pressure of 8 mmHg.   Comparison(s): Changes from prior study are noted. 03/02/2017: LVEF 60-65%,  mild LVH, grade 1 DD, aortic root 39 mm, no aortic stenosis.   OV 06/17/2021  Subjective:  Patient ID: Alvaro Augusta, male , DOB: Feb 26, 1941 , age 89 y.o. , MRN: [id number] , ADDRESS: 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     HPI JERVIS TRAPANI 83 y.o. 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  Since his last visit from ILD standpoint he continues to be stable.  His walking desaturation test is stable.  However he is reporting more shortness of breath but this is directly related to more fatigue.  Review of the labs indicate in October 2022 he had mild anemia and also mild hyponatremia.  In the past radiation therapy for prostate made him extremely tired.  He is also on pirfenidone  that can cause tiredness.  He now has lymphoma also that can cause tiredness.  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  He seen Dr. Jacquelyne Matte.  They are planning to start R-CHOP treatment after Christmas.  He is frustrated by the recurrence of various medical illnesses.  The second cancer since his IPF diagnosis.  In terms of his IPF: He is now on generic pirfenidone .  He feels the nausea is worse.  He wants to switch to branded Esbriet .  Gave him donor sample of Esbriet  by [employer].  Lot number [id number] with expiration June 2025 [unopened bottle], lot number [id number] with expiration 05/2023 [unopened bottle and also lot number [id number] with expiration 12/2023 [partial bottle].  He is asked me to send a message to the pharmacist to make the switch.  He says even if it is more expensive he will play for the branded Esbriet  because it is better quality of life.  He is worried that nausea and other side effects will get worse particularly once he starts chemotherapy.   No results found.   OV 09/18/2021  Subjective:  Patient ID: Alvaro Augusta, male , DOB: January 11, 1941 , age 4 y.o. , MRN: [id number] , ADDRESS: [address] [place] [address] PCP Aldo Hun, MD Patient Care Team: Aldo Hun, MD as PCP - General (Internal Medicine) Judyth Nunnery, RN as Oncology Nurse Navigator [place], Jordis Nevins, RN as Oncology Nurse Navigator  This Provider for this visit: Treatment Team:  Attending Provider: Maire Scot, MD  Follow-up idiopathic pulmonary fibrosis.  Diagnosis towards the end of 2018.  On pirfenidone .  Last CT scan of the chest September 2020. - > sept 2022  Underlying irritable bowel syndrome  Initial weight loss with pirfenidone  and then stabilized  History of prostate cancer  New diagnosis of B-cell lymphoma cervical lymphadenopathy -late 2022  Multifactorial fatigue  - mild anemia OCt 2022  - mild hypontermia OCt 2022  09/18/2021 -    Chief Complaint  Patient presents with   Follow-up    PFT performed today.  Pt states he is about the same since last visit. States that he is currently undergoing chemotherapy due to diagnosis of lymphoma.     HPI SKIPPER DACOSTA 83 y.o. -returns for follow-up.  He is taking Esbriet  instead of generic pirfenidone .  He is also taking 3 little pills 3 times daily.  He finds that Esbriet  to be better than generic pirfenidone .  He finds 3 little pills better than 1 whole pill in terms of nausea.  Still is dealing with a lot of nausea both from the chemo and Esbriet .  He says yesterday was really bad day with nausea.  He does take Zofran  but we discussed and said he will try taking the Zofran  30 to 60 minutes before the Esbriet .  Nevertheless he has been able to gain some weight.  He is actually feeling slightly less short of breath.  He is able to do more particularly after his back pain has resolved.  Nevertheless he is on antiprostate cancer therapy.  He is also undergoing chemotherapy from his B-cell lymphoma.  His mood is better.  He says he is socially isolating significantly because of his pulm fibrosis and cancer history.  He wanted to know if he could go out.  He did go to [employer] for show but wore a mask.  I did indicate to him that he could follow the CDC map to see prevalence of respiratory viruses and when the problem is overall low he could take more risk.  Also like being outdoors is less risky.  If he is endorsing particularly a cluster putting a mask on reduces his risk.  But I did say that he could use these guardrails and be more active so he can get some socialization and help his mood.  Can also give him purpose.  He is now sold his house and is planning to move into [facility].  In terms of his walking desaturation test and symptoms he stable.  In terms of his pulmonary function test his DLCO is stable but his FVC is down.  He is feeling similar.  Most of the decline  seems to be from several years ago but most recently appears to be stable overall. We did discuss clinical trials but cancer generally is an exclusion but otherwise he would be interested.  We did discuss potential for doing inhaled treprostinil trial.  We did discuss the fact the neck step and standard of care would be oxygen  if he were to decline but so far he has held good.  He recently lost his friend [facility] who also had pulmonary fibrosis.     He has upcoming [employer] appointment but he missed the 1 earlier in the year because of all ischemia.  He is thinking about holding off on going to [facility] and just following here.  I was fine with this.   OV 10/28/2021  Subjective:  Patient ID:  Alvaro Augusta, male , DOB: 03/29/1941 , age 81 y.o. , MRN: [id number] , ADDRESS: [address] [place] [address] PCP Aldo Hun, MD Patient Care Team: Aldo Hun, MD as PCP - General (Internal Medicine) Judyth Nunnery, RN as Oncology Nurse Navigator [place], Jordis Nevins, RN as Oncology Nurse Navigator  This Provider for this visit: Treatment Team:  Attending Provider: Maire Scot, MD    10/28/2021 -   Chief Complaint  Patient presents with   Acute Visit    Pt states since his last chemo treatment, he has been having problems with his breathing and also states that his pulse has been elevated.     Alvaro Augusta 83 y.o. -presents with his wife.  Status meeting his wife for the first time but she reminded me that she had made him early on in the course of his illness.  He is finished cycles of R-CHOP treatment for his lymphoma.  On 10/23/2021 Thursday he saw a nurse practitioner in oncology.  At that point it was noticed that he had finished mini CHOP treatment 5 rounds 10/02/2021.  He did complete his 6 cycle.  He was feeling well.  His resting heart rate was 82.  Pulse ox at rest was 100%.  His white count was observed to be slightly low.  They discussed several options and decided to  proceed with chemotherapy followed by UDENYCA  single dose on 10/24/2021 Friday.  He was doing fairly okay then on Sunday, 10/26/2021 he noticed resting tachycardia heart rate 125 but no fever.  At no point any fever.  No cough no chills no nausea no vomiting no diarrhea.  The same thing persisted even on Monday, 10/27/2021.  He noticed that if he walked his heart rate was up to 140 he became short of breath and stop.  His pulse ox would drop to 90% but no abnormal desaturations.  He started getting worried.  He went to [employer] yesterday 10/27/2021.  EKG reviewed sinus tachycardia with some ectopic atrial beats.  Nothing acute.    Labs: Found to have mild hyponatremia.  Also found to have mild anemia white count in response to Udenyca  went from low to  high.No D-dimer.  No troponin no BNP checked.  Last echo and CT scan of the chest in September 2022.  Walking desaturation test is definitely more tachycardic than baseline.  Associated with this he is also got exaggerated pulse ox drop although still adequate for simple walking test.  His pace is slower.   We repeated an EKG here: Personally visualized.  Sinus tachycardia heart rate 120 approximately.  Nothing acute.      OV 01/09/2022  Subjective:  Patient ID: Alvaro Augusta, male , DOB: 05/13/1941 , age 84 y.o. , MRN: [id number] , ADDRESS: [address] [place] [facility] [address] PCP Aldo Hun, MD Patient Care Team: Aldo Hun, MD as PCP - General (Internal Medicine) Judyth Nunnery, RN as Oncology Nurse Navigator [place], Jordis Nevins, RN as Oncology Nurse Navigator  This Provider for this visit: Treatment Team:  Attending Provider: Maire Scot, MD    01/09/2022 -   Chief Complaint  Patient presents with   Follow-up    PFT performed today. Pt states that he feels like he may be a little worse since last visit. States that he has had some problems with congestion which he wonders could be due to the weather.   HPI ANTERIO SCHEEL 83 y.o. 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  At this point in time he is finished his chemotherapy.  In April 2023 he did have some tachycardic response following the last chemo.  Cardiac work-up ended up being fine.  He says overall he is been feeling fine.  He is now relocated to a retirement home in [place].  [place]  During this time his pulse ox immediately after exertion was always in the 90s.  He did not feel that his IPF was getting worse when he was there.  He just came back yesterday.  However for the last few days he is having increased fatigue nasal congestion.  He also feels his left ear is blocked.  He tried to do pulmonary function test today.  He felt he did not give a good effort particularly the DLCO.  Consistent with this his numbers are much worse.  His FVC shows a 7% decline compared to March 2023 and DLCO 14% decline compared to March 2023.  His symptom score seems similar to September 2022.  His walking desaturation test is also stable on this level ground.  His weight loss is stabilized since he is at 168 pounds.  He is frustrated by this.  Is also frustrated by his multiple health issues.  The pirfenidone  he is taking the generic version.  He is tolerating it but is requiring Zofran  which then causes constipation.  Emotionally he is looking for a break from all his health issues  He plans to [place] the mountains [place] a [place] today.     01/26/2022: Today - follow up Patient presents today for follow up after being treated for sinusitis. He reports feeling better but still has issues with nasal congestion and pressure in his left ear. They did improve with prednisone  taper. These are problems he has been dealing with for a while now. He has tried multiple different nasal sprays with some relief. 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 He is sure this was the result of his worsening pulmonary function testing. He is  working on getting in to see Dr. Tellis Feathers with ENT. Otherwise, no concerns today. Breathing is overall stable. He gets winded with stair climbing but otherwise, no issues. Doesn't feel like his activity is limited by his breathing. He's planning to go back to the mountains in the next few weeks.     OV 04/07/2022  Subjective:  Patient ID: Alvaro Augusta, male , DOB: 07/08/1940 , age 56 y.o. , MRN: [id number] , ADDRESS: [address] [place] [facility] [address] PCP Aldo Hun, MD Patient Care Team: Aldo Hun, MD as PCP - General (Internal Medicine) Judyth Nunnery, RN as Oncology Nurse Navigator [place], Jordis Nevins, RN as Oncology Nurse Navigator  This Provider for this visit: Treatment Team:  Attending Provider: Maire Scot, MD   04/07/2022 -   Chief Complaint  Patient presents with   Follow-up    Spiro/DLCO done today. Breathing has been gradually worse since the last visit. He has had some muscle fatigue with exertion. He has some occ cough- non prod.      HPI CHANZE TEAGLE 83 y.o. -returns for follow-up.  He feels he is slowly progressing in terms of his IPF.  In fact his shortness of breath scores are slightly worse.  Last time his pulmonary function test showed a decline he thought was from sinusitis.  Sinusitis is cleared up but he says today when he did  his PFTs he felt that he was declining.  He did have some cough he is having some new cough for the last few weeks.  He is worried this might be from worsening IPF but he typically gets cough this time of the year from fall allergy  season.  Pulmonary function test shows an improvement compared to the most recent one but overall compared to earlier this year there is a decline.  I shared these results with him.  He is more worried about his fatigue.  However he did play some golf.  He says that he is significantly fatigued.  He has been going on for 6 to 12 months initially thought was related to cancer and chemotherapy but he says it  is the all the time.  The fatigue persist despite anemia correction despite his chemotherapy completing is still got fatigue.  He feels it happens episodically but then it is there all over the body and it exhausting.  He believes his primary care is checked his vitamin D  levels because he is on replacement.  He believes his TSH and hemoglobin A1c to be normal.  Apparently primary care physician has asked him to consider getting a CT coronaries.  He has seen Dr. Peder Bourdon in the past.  At the time it was from acute chemotherapy consideration.  Dr. Mitzie Anda at the time got an echocardiogram.  He does have enlarged pulmonary artery on the CT scan and he does have coronary artery calcification.  Back then in the spring during chemo side effects patient had deferred right heart catheterization.  This time of reach out to Dr. Mitzie Anda   He is due for an IPF-Pro registry visit today    OV 06/23/2022  Subjective:  Patient ID: Alvaro Augusta, male , DOB: [birth year] , age 50 y.o. , MRN: [id number] , ADDRESS: [address] [place] [facility] [address] PCP Aldo Hun, MD Patient Care Team: Aldo Hun, MD as PCP - General (Internal Medicine) Judyth Nunnery, RN as Oncology Nurse Navigator O'Kelley, Jordis Nevins, RN as Oncology Nurse Navigator  This Provider for this visit: Treatment Team:  Attending Provider: Maire Scot, MD    06/23/2022 -   Chief Complaint  Patient presents with   Follow-up    Pt states he has been doing okay since last visit.     HPI OSWALDO CUETO 83 y.o. 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  Overall he is in a better state of health.  His fatigue is improved.  His shortness of breath is also improved somewhat.  He continues pirfenidone  at full dose.  His last chemotherapy was in J May 2023.  His last androgen deprivation treatment was in July 2023.  He believes the chemo and the androgen deprivation was causing his fatigue.  With the passage of time he is better.  He is now living  in [facility].  He is doing daily exercise.  He says that he can go well on a flat ground but when he does uphill sometimes he has to stop but most of the time he is doing well.  He does workout with exercise bike.  He states his tachycardia has improved with more more physical conditioning.  However he does get fatigued and short of breath when he tries to play golf.  His GI issues in combination of irritable bowel syndrome and pirfenidone  continue but it is mild level.  He says 2023 has been a rough year and has been emotional.  Therefore primary care physician  started him on Pristiq on the advice of his daughter.  He says since then he somewhat better.  He did have sinusitis a few weeks ago and now he is better.  Take prednisone  and cephalosporin.  Social note: We talked about masking particularly with the surgery for respiratory viruses.  On July 25, 2022 he is going to get the Danney Dutton award for coverage at [facility].  He is going to talk about pulmonary fibrosis.  I encouraged him to be an advocate for pulmonary fibrosis.  He said in the past is The diagnosis private.  I did tell him that given the serious nature of the disease and rare disease I would encourage him to be as open as he wants to so there is more awareness and advocacy for the disease.  I also advised him to mask for this gathering to the extent possible as a risk reduction against respiratory viruses.    OV 10/13/2022  Subjective:  Patient ID: Alvaro Augusta, male , DOB: [birth year] , age 69 y.o. , MRN: [id number] , ADDRESS: [address] [place] [facility] [address] PCP Aldo Hun, MD Patient Care Team: Aldo Hun, MD as PCP - General (Internal Medicine) Judyth Nunnery, RN as Oncology Nurse Navigator O'Kelley, Jordis Nevins, RN as Oncology Nurse Navigator  This Provider for this visit: Treatment Team:  Attending Provider: Maire Scot, MD  10/13/2022 -   Chief Complaint  Patient presents with   Follow-up     F/up     HPI Alvaro Augusta 83 y.o. -returns for follow-up.  On 08/11/2022 he underwent elective neck surgery by Dr. Gwendlyn Lemmings because of cervical spondylosis and radiculopathy.  Since then the pain is resolved.  After this on 08/23/2022 he ended up with tachycardia in the ED.  Pulmonary embolism ruled out.  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  He is on beta-blocker.  He says he is a little more short of breath doing things around the house.  He is also having significant PVCs.  He is on Toprol .  His chronic anemia continues.  The PVC issue is new he is frustrated by this.  He had pulmonary function test today and it shows a definite decline compared to a year ago.  He is also lost weight with full dose pirfenidone .  He continues have significant fatigue.  Is also reporting some new nausea.  I did indicate to him that pirfenidone  is the most likely culprit for the trial of weight loss fatigue and nausea.  We discussed taking a holiday with the pirfenidone  but we took a shared decision making to reduce the dose and monitor.  In addition for his PVCs need to see if his ILD has gotten worse.  Which it has on the pulmonary function test.  He has agreed to get a overnight pulse oximetry study done.  He is somewhat despondent because of all his medical issues.       OV 01/04/2023  Subjective:  Patient ID: Alvaro Augusta, male , DOB: 1941-03-10 , age 6 y.o. , MRN: [id number] , ADDRESS: [address] [place] [facility] [id number] PCP Aldo Hun, MD Patient Care Team: Aldo Hun, MD as PCP - General (Internal Medicine) Judyth Nunnery, RN as Oncology Nurse Navigator O'Kelley, Jordis Nevins, RN as Oncology Nurse Navigator  This Provider for this visit: Treatment Team:  Attending Provider: Maire Scot, MD       01/04/2023 -   Chief Complaint  Patient presents with  Follow-up    F/up on PFT     HPI ZACCHAEUS HALM 83 y.o. -returns for follow-up.  In this visit he reports that after moving to  [facility] and the food quality is good he has gained weight.  His appetite is good.  His weight is stable.  However he does feel that he is declining.  He says symptoms are worse.  In fact the dyspnea symptom score shows that he is slowly declining with more dyspnea.  He is back on full dose pirfenidone .  The lower dose did not improve his fatigue.  We discussed and we agreed that his fatigue is likely multifactorial from the disease other medical issues and medications including Lopressor  for PVC.  Today's exercise hypoxemia test was normal.  He is somewhat frustrated but also sanguine about the fact that he is declining.  His pulmonary function test does show decline.  I personally reviewed it.  Last set of labs was in May 2024 with a creatinine of 0.97 mg percent.  Last liver function test was in April 2024 normal.   Encounter therapeutic monitoring with high risk prescription pirfenidone   - Esbriet /Pirfenidone  requires intensive drug monitoring due to high concerns for Adverse effects of , including  Drug Induced Liver Injury, significant GI side effects that include but not limited to Diarrhea, Nausea, Vomiting,  and other system side effects that include Fatigue, headaches, weight loss and other side effects such as skin rash. These will be monitored with  blood work such as LFT initially once a month for 6 months and then quarterly  -He will need liver function test today.   Other issues - Did see Dr. Scherrie Curt 10/06/2022.  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  -We discussed the progression.  We felt the next best option is clinical trials but a lot of the trials excluded patients who have cancer.  Did express that currently 2 trials have high potential for future approval.  This including inhaled treprostinil versus placebo.  The other is Bexotegrast versus placebo.  Did indicate to him we will prescreen him and then decide which 1 he would be suitable for.  He is open to the  idea of clinical trials.  Did communicate with the research coordinator Marylynn Soho       OV 05/13/2023  Subjective:  Patient ID: Alvaro Augusta, male , DOB: 25-Nov-1940 , age 89 y.o. , MRN: [id number] , ADDRESS: [address] [place] [facility] [address] PCP Aldo Hun, MD Patient Care Team: Aldo Hun, MD as PCP - General (Internal Medicine) Judyth Nunnery, RN as Oncology Nurse Navigator O'Kelley, Jordis Nevins, RN as Oncology Nurse Navigator  This Provider for this visit: Treatment Team:  Attending Provider: Maire Scot, MD    05/13/2023 -   Chief Complaint  Patient presents with   Follow-up    Pt denies any concerns, still on esbriet .    HPI Kristin Peyer Warriner 83 y.o. -returns for follow-up.  He is now being randomized on inhaled treprostinil versus placebo on the [facility] 301 study.  He is taking the nebulizer 4 times daily and each time 5 times.  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  He has no sore throat no chest pain no headache no dizziness no hemoptysis.  He feels good.  He continues his pirfenidone .  He is maintaining his weight.  His pulmonary function test shows  slight improvement after continued progression.  He is back to being at the level of spring 2024.  He was excited to hear this.  We did tell him that we have no idea if he is on placebo the real drug.  He plans to go to [place] .  I did tell him that he needs to take his study medication with him and follow the instructions to the T.  He has had labs with research and I will review those.     PFT  OV 09/16/2023  Subjective:  Patient ID: Alvaro Augusta, male , DOB: May 07, 1941 , age 63 y.o. , MRN: [id number] , ADDRESS: [address] [place] [facility] [address] PCP Aldo Hun, MD Patient Care Team: Aldo Hun, MD as PCP - General (Internal Medicine) Judyth Nunnery, RN as Oncology Nurse Navigator O'Kelley, Jordis Nevins, RN as Oncology Nurse  Navigator  This Provider for this visit: Treatment Team:  Attending Provider: Maire Scot, MD   COVID hypoxemic respiratory failure requiring oxygen  mid February 2025 and admission to [facility]   09/16/2023 -   Chief Complaint  Patient presents with   Hospitalization Follow-up    Recently admitted with covid. He feels he developed sinus infection- started cefdinir and this has improved. Breathing is getting better each day.      HPI ANDREI MCCOOK 83 y.o. -returns for posthospital follow-up.  Mid February 2025 was hospitalized for COVID-19 requiring oxygen .  Since then he is significantly improved.  He states he is walking around the house without oxygen .  There is a huge improvement because when I spoke to him even a week or 2 after the hospitalization he was in no position team and make the office visit.  He says he still fatigued but he needs physical therapy he did not want to go to pulmonary rehabilitation because he is working out at the place where he lives.  He says he got excellent care at [facility] long from the hospitalist service.  He is worried about a nodule report on his chest x-ray at admission but I visualized it and showed it to him I think is just ILD infiltrates and COVID infiltrates but we took shared decision making to get a chest x-ray today.  With his fatigue I reviewed his labs he is still anemic he also had low sodium so we will recheck these.  Is tolerating pirfenidone  well but he needs safety monitoring so we will check liver test.  Given his improvement in oxygen  we decided to just uses oxygen  with exertion past 300 feet.  Also test overnight pulse oximetry on room air to see if he still needs oxygen  at night  He is on research protocol of inhaled treprostinil versus placebo.  He had expressed lack of interest when he was fatigued posthospitalization and taking the study drug or doing the research study visits.  Currently the study drug is on holiday.  I had a  conversation with him.  I showed him the preliminary data and shared my anecdotal experience of patients/subjects in the study.  Based on this because it establish safety profile of the inhaled treprostinil he is agreed to continue restarting it and going back on schedule with the research drug.  He did express inconvenience with the nebulizer schedule but he understands this part of what is required     Last Weight  Most recent update: 09/16/2023  8:41 AM    Weight  76 kg (167 lb 9.6 oz)  OV 11/04/2023  Subjective:  Patient ID: Alvaro Augusta, male , DOB: 02-Sep-1940 , age 34 y.o. , MRN: [id number] , ADDRESS: [address] [place] [facility] [address] PCP Aldo Hun, MD Patient Care Team: Aldo Hun, MD as PCP - General (Internal Medicine) Judyth Nunnery, RN as Oncology Nurse Navigator O'Kelley, Jordis Nevins, RN as Oncology Nurse Navigator  This Provider for this visit: Treatment Team:  Attending Provider: Maire Scot, MD    11/04/2023 -   Chief Complaint  Patient presents with   Acute Visit    Shortness of breath    HPI YASMIN DIBELLO 83 y.o. -Dr. Linder Revere is here because he he texted me saying he was not well.  He ended up in the ER with concerns of PVCs.  Today saw Dr. Peder Bourdon and he reports that are new PACs.  I have visualized the EKG and I agree with this.  Lopressor  is being increased but patient tells me that he is just feeling he is declining his shortness of breath is worse.  Is also more fatigued.  Review of the labs from yesterday indicate normal troponin and BNP.  Pulmonary function test indicates that his FVC at [facility] and subsequently went at research are declining more precipitously since his COVID-19 hospitalization mid February 2025.  At that point in time PE was ruled out but we do not have a subsequent D-dimer.  He is not on anticoagulation.  He is finding it difficult to do things he is always on oxygen .  However ironically was able to  complete a 6-minute walk test at [facility] without oxygen  and even today exercise hypoxemia test showed he did not desaturate but he feels subjectively better only if uses oxygen .  He says he is visualized his own chest x-ray and this makes him feel depressed.  Of note he is attending rehabilitation which is helping him.      Last Weight  Most recent update: 11/04/2023  4:30 PM    Weight  75.3 kg (166 lb)               OV 12/17/2023  Subjective:  Patient ID: Alvaro Augusta, male , DOB: 02-Nov-1940 , age 86 y.o. , MRN: [id number] , ADDRESS: [address] [place] [address] PCP Aldo Hun, MD Patient Care Team: Aldo Hun, MD as PCP - General (Internal Medicine) Judyth Nunnery, RN as Oncology Nurse Navigator O'Kelley, Jordis Nevins, RN as Oncology Nurse Navigator  This Provider for this visit: Treatment Team:  Attending Provider: Maire Scot, MD     Follow-up idiopathic pulmonary fibrosis.  Diagnosis towards the end of 2018.  On pirfenidone .  Last CT scan of the chest September 2020. 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  Depression and started on Pristiq by primary care physician in 2023  Esbriet /Pirfenidone  requires intensive drug monitoring due to high concerns for Adverse effects of , including  Drug Induced Liver Injury, significant GI side effects that include but not limited to Diarrhea, Nausea, Vomiting,  and other system side effects that include Fatigue, headaches, weight loss and other side effects such as skin rash. These will be monitored with  blood work such as LFT initially once a month for 6 months and then quarterly    12/17/2023 -   Chief Complaint  Patient presents with   Follow-up     HPI SUDAIS BANGHART 83 y.o. -returns for follow-up.  He is now attending pulmonary rehabilitation which he states helps him.  The using 4 L of nasal cannula oxygen .  His weight is stable.  [place] in [place] home and he fell down and took a bruise to his left lower back but is otherwise doing well.  His main issue is that he did this to pirfenidone .  While he is aware of this he is somewhat reluctant to stop it.  He is also dealing with alternating constipation and diarrhea partly because of Zofran  for nausea and also the opioids he takes.  Then he has to follow this up with laxatives.  I advised 1 week of rest from pirfenidone  but he is somewhat reluctant but he agreed.  He is seeing other doctors for his fatigue but I did tell him to do a drug holiday as a test to see if fatigue is related to pirfenidone .  He did have his pulmonary function test this result was not available at the time of the visit but clinically appears stable from the previous visit [see symptom scores below] but overall he is dealing with progressive disease.  Later got the pulmonary function test and definitely his decline compared to November 2024.  In the interim.  He did have his COVID.    Last Weight  Most recent update:  12/17/2023 11:40 AM    Weight  74 kg (163 lb 3.2 oz)                SYMPTOM SCALE - ILD 08/23/2018  04/21/2019  10/16/2019  04/11/2020 168# 10/22/2020 170# - esbriet  and XRT for prostate 04/03/2021 168# 06/17/2021 168# lymphoma 09/18/2021 172# 01/09/2022 168# 04/07/2022 173# 06/23/2022 171#  10/13/2022 164# esbiret 01/04/2023 164# Esbret full dose 05/13/2023 Esbriet  + [place] [facility] study 167# 09/16/2023 167# Covid admit feb 2025 11/04/2023 166# 12/17/2023 163# esbriet  full does  O2 use ra e ra   ra ra ra ra ra ra ra ra ra  ra 4L wth exertion  Shortness of Breath 0 -> 5 scale with 5 being worst (score 6 If unable to do)                  At rest 0 0 0 0 0 0 0 0 0 0  0 0 0 0 0 1 0  Simple tasks - showers, clothes change, eating, shaving *0** 0 0 0 0 0 0 0 0 1 0  1 1 1 2 4 1   Household (dishes, doing bed, laundry) 0 0 0 0 0  1 1 0 1 1 1 2 3 2 2  5 2  Shopping 0 0 0 0 0 0 1 0 1 1  1 2 3 2 3 4 3   Walking level at own pace 0 0 0 0 0 0 1 0 1 1  1 2 1 1 3 2 3   Walking up Stairs 2 2 2 1 2 2 3 2 3 4 3 4 4 3 5 5 5   Total (40 - 48) Dyspnea Score 2 2 2 1 2 3 6 2 6 8 6 11 12 9 15 21 14   How bad is your cough? 0 0 0 0 0 0 1 2 0 1  0 0 0 0 0 0 0  How bad is your fatigue 1 due to esbriet  0 2 0 2 1 3.5 2 3 3 2  2.5 3 3 3  3.5 3  nausea   1 0 1 1 3 0 1 1 1 2  3 2 1 2 3   vomit   0 0 0 0 0 0 0 0 0 0  0 0 0 1 2  diarrhea   1 1 3 1 2  0 1 1 1 2 1 0 1 1 3   anxiety   0 0 0 1 1 1 2 0 0 1  2 1 1 1 1   depresso0   0 0 0 1 1 1 2 0 1 1  2 1 0 0 1       Simple office walk 185 feet x  3 laps goal with forehead probe 12/07/2017 Weight 180# 03/08/2018 Weight 174.9 05/10/2018 Weight 174# 08/23/2018 Weight 178# 04/21/2019 Weight 170# 10/16/2019 173# 04/11/2020 168# 10/22/2020 170# 04/03/2021 168# 06/17/2021 168# 09/18/2021  10/28/2021  01/09/2022 168# 06/23/2022 171# 01/04/2023  05/13/2023  09/16/2023    O2 used Room air Room air Room air Room air Room air Room air   ra ra ra  ra ra ra ra ra   Number laps completed 3 3 3 3 3 3   3  3      Sit stand x 15 Sist stand x 10 Walked 2 of 3 las   Comments about pace Moderate pace Mod pace Mod fast pace fast  fast   fast   Avg pace - slower than before        Resting Pulse Ox/HR 100% and 83/min 100% ad 85/min 100% and 91/min 100% and 82/min  100% asnd 91 100% and 83/min 98% and 94/min 100% and 82 100% and 93 100% and 78 99% and H 126 99% and HR 88 100% ahd HR 87 97% andHR 70 95$ and HR 72 99% and HR 95   Final Pulse Ox/HR 99% and 98/min 98$ and 104/min 98% and 108/min 98% and 100/min  96% and 106/mom 98% and 96/mn 95% and 110.min 97% and 105 97% ad 109 97% and 94 92% and HR 143 97% and HR 111 96% and HR 110 96% and HR 96 92% and HR 92 93% and HR 107   Desaturated </= 88% no no no no  no              Desaturated <= 3% points no no ni no  yesm 4 points  Yes, 3 points Yes, 3 poitn Yes 3 pomnts Yes 3 Yes, 7 points  Yes 4 ponts      Got Tachycardic >/= 90/min yes yes yes yes  yes  yes yes yes yes yes        Symptoms at end of test No  complaints No complaints  No complaints  Mild dyspnea  no Mild dyspnea Mild dyspnea Mild dysnea Modeate dyspnea  No complaints Little dyspnea     Miscellaneous comments x ? sligh increase in tachy/stable ? incraeased tachty       Stable since April 2021                 SIT STAND TEST - goal 15 times   11/04/2023    O2 used Room air 5-6 min   PRobe - finter or forehead forehead   Number sit and stand completed - goal 15 15   Time taken to complete 1 min   Resting Pulse Ox/HR/Dyspnea  97% and 83/min and dyspnea of 3/10    Peak measures 9 % and 96/min and dyspnea of 8/10   Final Pulse Ox/HR 94% and 93/min and dyspnea of 6/10   Desaturated </= 88% no   Desaturated <= 3% points yes   Got Tachycardic >/= 90/min yes   Miscellaneous comments 2 points       PFT     Latest Ref Rng & Units 12/17/2023    8:31 AM 05/13/2023   12:56 PM 01/04/2023    8:51 AM 10/09/2022    4:21 PM 04/07/2022    1:46 PM 01/09/2022    9:17 AM 09/18/2021    8:52 AM  PFT Results   FVC-Pre L 1.70  P 2.37  2.27  2.33  2.53  2.49  2.68   FVC-Predicted Pre % 46  P 64  62  63  68  64  68   Pre FEV1/FVC % % 80  P 86  84  86  86  87  84   FEV1-Pre L 1.36  P 2.03  1.92  2.01  2.18  2.16  2.25   FEV1-Predicted Pre % 53  P 78  74  77  83  78  80   DLCO uncorrected ml/min/mmHg 10.56  P 11.71  10.66  11.48  12.36  12.55  13.07   DLCO UNC% % 46  P 51  46  50  53  53  54   DLCO corrected ml/min/mmHg 11.41  P 11.71  10.66  12.81  13.57  12.55  14.64   DLCO COR %Predicted % 50  P 51  46  55  59  53  61   DLVA Predicted % 100  P 81  77  94  89  91  105     P Preliminary result       LAB RESULTS last 96 hours No results found.       has a past medical history of Arthritis, Depression, Diffuse large B cell lymphoma (HCC) (2023), Diverticulosis of colon (without mention of hemorrhage), GERD (gastroesophageal reflux disease), Hiatal hernia, HLD (hyperlipidemia), Internal hemorrhoids without mention of complication, Irritable bowel syndrome, Other specified gastritis without mention of hemorrhage, Prostate cancer (HCC) (2022), Pulmonary fibrosis (HCC), and Stress fracture (02/2021).   reports that he has never smoked. He has never used smokeless tobacco.  Past Surgical History:  Procedure Laterality Date   ANTERIOR CERVICAL DECOMP/DISCECTOMY FUSION N/A 08/11/2022   Procedure: Anterior Cervical Decompression/Discectomy Fusion - Cervical Four-Cervical Five -  Cervical Five-Cervical Six - Cerivcal Six-Cerival Seven;  Surgeon: Agustina Aldrich, MD;  Location: [facility] OR;  Service: Neurosurgery;  Laterality: N/A;   CARPAL TUNNEL RELEASE     left   CATARACT EXTRACTION  06/2011   bilateral   DEEP NECK LYMPH NODE BIOPSY /  EXCISION  03/25/2021   INGUINAL HERNIA REPAIR Right 09/05/2019   Procedure: OPEN REPAIR RIGHT INGUINAL HERNIA WITH MESH;  Surgeon: Oralee Billow, MD;  Location: [facility] ORS;  Service: General;  Laterality: Right;   LUMBAR DISC SURGERY  1986, 1995   x 2   LUMBAR  LAMINECTOMY/DECOMPRESSION MICRODISCECTOMY Left 07/11/2013   Procedure: LUMBAR ONE TO TWO, LUMBAR TWO TO THREE, LUMBAR THREE TO FOUR LUMBAR LAMINECTOMY/DECOMPRESSION MICRODISCECTOMY 3 LEVELS;  Surgeon: Baruch Bosch, MD;  Location: [facility] NEURO ORS;  Service: Neurosurgery;  Laterality: Left;   NASAL SINUS SURGERY     RIGHT/LEFT HEART CATH AND CORONARY ANGIOGRAPHY N/A 04/14/2022   Procedure: RIGHT/LEFT HEART CATH AND CORONARY ANGIOGRAPHY;  Surgeon: Darlis Eisenmenger, MD;  Location: [facility] INVASIVE CV LAB;  Service: Cardiovascular;  Laterality: N/A;    No Known Allergies  Immunization History  Administered Date(s) Administered   Fluad Quad(high Dose 65+) 03/18/2020   Fluzone Influenza virus vaccine,trivalent (IIV3), split virus 03/28/2010, 04/14/2011, 03/22/2012, 03/09/2013   Influenza Split 10/03/2009, 03/28/2014   Influenza, High Dose Seasonal PF 03/19/2016, 03/06/2017, 04/09/2018, 03/07/2019, 04/06/2020, 04/11/2022   Influenza, Quadrivalent, Recombinant, Inj, Pf 03/12/2018, 03/10/2019, 04/06/2020, 04/07/2023   Influenza,inj,Quad PF,6+ Mos 04/04/2014, 03/26/2015, 04/05/2016   Influenza-Unspecified 04/05/2016, 04/05/2021, 04/25/2023   PFIZER Comirnaty(Gray Top)Covid-19 Tri-Sucrose Vaccine 10/15/2020   PFIZER(Purple Top)SARS-COV-2 Vaccination 07/20/2019, 08/10/2019, 02/27/2020, 03/23/2021   PNEUMOCOCCAL CONJUGATE-20 02/18/2021   Pfizer Covid-19 Vaccine Bivalent Booster 5y-11y 10/05/2022   Pneumococcal Conjugate-13 03/30/2013, 07/31/2013, 04/26/2018   Pneumococcal Polysaccharide-23 10/03/2009, 01/14/2010, 03/31/2018, 02/18/2021   Pneumococcal-Unspecified 04/05/2016   Td 10/20/2016   Tdap 10/03/2009   Unspecified SARS-COV-2 Vaccination 03/21/2023   Zoster Recombinant(Shingrix) 01/03/2017   Zoster, Live 10/03/2009    Family History  Problem Relation Age of Onset   Heart failure Father 90   Bladder Cancer Mother    Breast cancer Mother    Colon cancer Neg Hx    Pancreatic cancer Neg Hx     Rectal cancer Neg Hx    Stomach cancer Neg Hx    Prostate cancer Neg Hx      Current Outpatient Medications:    acetaminophen  (TYLENOL ) 500 MG tablet, Take 1,000 mg by mouth every 6 (six) hours as needed for moderate pain., Disp: , Rfl:    acetaminophen -codeine  (TYLENOL  #3) 300-30 MG tablet, Take 1 tablet by mouth every 4 (four) hours as needed (for pain)., Disp: , Rfl:    albuterol  (VENTOLIN  HFA) 108 (90 Base) MCG/ACT inhaler, Inhale 2 puffs into the lungs every 6 (six) hours as needed for wheezing or shortness of breath., Disp: 6.7 g, Rfl: 2   aspirin  81 MG chewable tablet, Chew 81 mg by mouth 3 (three) times a week., Disp: , Rfl:    carboxymethylcellulose (REFRESH TEARS) 0.5 % SOLN, Place 1 drop into both eyes daily as needed (dry eyes)., Disp: , Rfl:    carisoprodol  (SOMA ) 250 MG tablet, Take 175-350 mg by mouth 3 (three) times daily as needed (for muscle spasms)., Disp: , Rfl:    Cholecalciferol  (VITAMIN D -3) 1000 UNITS CAPS, Take 2,000 Units by mouth every evening., Disp: , Rfl:    CO-ENZYME Q-10 PO, Take 1 tablet by mouth every evening. , Disp: , Rfl:    Cyanocobalamin  (VITAMIN B12) 1000 MCG TBCR, Take 1,000 mcg by mouth daily., Disp: , Rfl:    denosumab  (PROLIA ) 60 MG/ML SOSY injection, Inject 60 mg into the skin every 6 (six) months. At [facility], Disp: , Rfl:    desvenlafaxine (PRISTIQ) 50 MG 24 hr  tablet, Take 50 mg by mouth daily., Disp: , Rfl:    diclofenac  (VOLTAREN ) 75 MG EC tablet, Take 75 mg by mouth 2 (two) times daily as needed (pain.)., Disp: , Rfl: 2   diclofenac  sodium (VOLTAREN ) 1 % GEL, Apply 4 g topically 4 (four) times daily as needed (pain.)., Disp: , Rfl: 12   dicyclomine  (BENTYL ) 10 MG capsule, Take one by mouth every 6 hours as needed for abdominal cramping, Disp: 30 capsule, Rfl: 1   diphenoxylate-atropine (LOMOTIL) 2.5-0.025 MG tablet, Take 1 tablet by mouth as needed for diarrhea or loose stools., Disp: , Rfl:    Emollient (AQUAPHOR ADV PROTECT  HEALING EX), Apply 1 Application topically daily as needed (itching)., Disp: , Rfl:    Evolocumab (REPATHA) 140 MG/ML SOSY, Inject 140 mg into the skin every 30 (thirty) days., Disp: , Rfl:    Glucosamine Sulfate 500 MG TABS, Take 500 mg by mouth daily., Disp: , Rfl:    HYDROcodone -acetaminophen  (NORCO) 10-325 MG tablet, Take 1 tablet by mouth See admin instructions. Take 1 tablet by mouth every 4-6 hours as needed for pain, Disp: , Rfl:    latanoprost  (XALATAN ) 0.005 % ophthalmic solution, Place 1 drop into both eyes at bedtime., Disp: , Rfl:    loratadine  (CLARITIN ) 10 MG tablet, Take 10 mg by mouth daily., Disp: , Rfl:    meclizine  (ANTIVERT ) 25 MG tablet, Take 25 mg by mouth 3 (three) times daily as needed for dizziness., Disp: , Rfl:    metoprolol  succinate (TOPROL -XL) 50 MG 24 hr tablet, Take 1 tablet (50 mg total) by mouth in the morning and at bedtime. Take with or immediately following a meal., Disp: , Rfl:    Multiple Vitamin (MULTIVITAMIN) tablet, Take 1 tablet by mouth daily with breakfast., Disp: , Rfl:    omeprazole  (PRILOSEC) 40 MG capsule, Take 1 capsule (40 mg total) by mouth daily. Call our office at [phone] to schedule an appointment for additional refills., Disp: 30 capsule, Rfl: 0   ondansetron  (ZOFRAN ) 8 MG tablet, Take 1 tablet (8 mg total) by mouth every 8 (eight) hours as needed for nausea or vomiting., Disp: 30 tablet, Rfl: 2   Pirfenidone  801 MG TABS, TAKE 1 TABLET THREE TIMES A DAY WITH MEALS, Disp: 270 tablet, Rfl: 5   Probiotic Product (ALIGN) 4 MG CAPS, Take 4 mg by mouth daily., Disp: , Rfl:    SODIUM FLUORIDE 5000 PLUS 1.1 % CREA dental cream, Place 1 Application onto teeth at bedtime., Disp: , Rfl:    XHANCE  93 MCG/ACT EXHU, Place 1 spray into both nostrils 2 (two) times daily as needed (for sinus issues)., Disp: , Rfl:       Objective:   Vitals:   12/17/23 1137 12/17/23 1141  BP: 110/60   Pulse: 88 81  Temp: 98.5 F (36.9 C)   TempSrc: Oral   SpO2:  98% 98%  Weight: 163 lb 3.2 oz (74 kg)   Height: 5' 9 (1.753 m)     Estimated body mass index is 24.1 kg/m as calculated from the following:   Height as of this encounter: 5' 9 (1.753 m).   Weight as of this encounter: 163 lb 3.2 oz (74 kg).  @WEIGHTCHANGE @  [employer]   12/17/23 1137  Weight: 163 lb 3.2 oz (74 kg)     Physical Exam   General: No distress. Looks better than lvisit though MOOD is down O2 at rest: no but has POC Cane present: no Sitting in wheel  chair: no Frail: no Obese: no Neuro: Alert and Oriented x 3. GCS 15. Speech normal Psych: Pleasant Resp:  Barrel Chest - no.  Wheeze - no, Crackles - MILD BASE, No overt respiratory distress CVS: Normal heart sounds. Murmurs - no Ext: Stigmata of Connective Tissue Disease - no SKIN BRUSE LEFT LOWER BACK FROM FALL HEENT: Normal upper airway. PEERL +. No post nasal drip        Assessment:       ICD-10-CM   1. 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HCC)  J84.112     2. Encounter for therapeutic drug monitoring  Z51.81     3. Other fatigue  R53.83     4. Nausea  R11.0     5. Alternating constipation and diarrhea  R19.8          Plan:     Patient Instructions     ICD-10-CM   1. 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HCC)  J84.112     2. Encounter for therapeutic drug monitoring  Z51.81     3. Other fatigue  R53.83     4. Nausea  R11.0     5. Alternating constipation and diarrhea  R19.8        IPF: =  Sorry I still do not have the results of your pulmonary function test but clinically appears stable relative to last visit.  I understand pulmonary function tests are getting more difficult to do.  Glad pulmonary rehabilitation is helping  Fatigue, nausea, alternating constipation and diarrhea  = As some of most of the side effects could be because of Esbriet /pirfenidone   PLAN -Stop Esbriet /pirfenidone  for at least 10 days  - And then restarted 801 mg 1 times daily with food for 1 week  followed by 801 mg 2 times daily with food for 1 week and then full dose of 801 mg 3 times daily  -Continue pulm rehabilitation - Oxygen  with exertion - Please note by end of 2025/early 2026 at third approval for pulmonary fibrosis is pending potentially.  This medication is called Nerandomilast   Follow-up - Be in touch with me by text message in 1 week after stopping ESbreit/pirfenidone  - 2-3 months x 30 min viit Dr Bertrum Brodie  - No PFT    - 6 weeks already scheduled 12/17/23   FOLLOWUP Return in about 10 weeks (around 02/25/2024) for 30 min visit, ILD, with Dr Bertrum Brodie, Face to Face Visit.  ( Level 05 visit E&M 2024: Estb >= 40 min   visit type: on-site physical face to visit  in total care time and counseling or/and coordination of care by this undersigned MD - Dr Maire Scot. This includes one or more of the following on this same day 12/17/2023: pre-charting, chart review, note writing, documentation discussion of test results, diagnostic or treatment recommendations, prognosis, risks and benefits of management options, instructions, education, compliance or risk-factor reduction. It excludes time spent by the CMA or office staff in the care of the patient. Actual time 45 min)   SIGNATURE    Dr. Maire Scot, M.D., F.C.C.P,  Pulmonary and Critical Care Medicine Staff Physician, [facility] Director - Interstitial Lung Disease  Program  Pulmonary Fibrosis [facility] at [facility] [place], [place], [address]  Pager: [phone], If no answer or between  15:00h - 7:00h: call [phone] Telephone: [phone]  3:27 PM 12/17/2023

## 2023-12-17 NOTE — Patient Instructions (Addendum)
 ICD-10-CM   1. 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HCC)  J84.112     2. Encounter for therapeutic drug monitoring  Z51.81     3. Other fatigue  R53.83     4. Nausea  R11.0     5. Alternating constipation and diarrhea  R19.8        IPF: =  Sorry I still do not have the results of your pulmonary function test but clinically appears stable relative to last visit but overall progressive.  I understand pulmonary function tests are getting more difficult to do.  Glad pulmonary rehabilitation is helping  Fatigue, nausea, alternating constipation and diarrhea  = As some of most of the side effects could be because of Esbriet /pirfenidone   PLAN -Stop Esbriet /pirfenidone  for at least 10 days  - And then restarted 801 mg 1 times daily with food for 1 week followed by 801 mg 2 times daily with food for 1 week and then full dose of 801 mg 3 times daily  -Continue pulm rehabilitation - Oxygen  with exertion - Please note by end of 2025/early 2026 at third approval for pulmonary fibrosis is pending potentially.  This medication is called Nerandomilast   Follow-up - Be in touch with me by text message in 1 week after stopping ESbreit/pirfenidone  - 2-3 months x 30 min viit Dr Bertrum Brodie  - No PFT    - 6 weeks already scheduled 12/17/23

## 2023-12-17 NOTE — Patient Instructions (Signed)
 Spirometry/DLCO performed today.

## 2023-12-17 NOTE — Telephone Encounter (Signed)
 Kenneth [name] -please let Dr. Linder Revere know that compared November 2024 the June pulmonary function test shows continued decline of lung function.  I think the COVID in February 2025 really is contributed to this.  Nevertheless I do think compared to his most recent visit he is clinically stable.  He was worried about stopping the Esbriet  for a week but he is having so much side effects that I think is a good idea to stop for a week and then slowly restart.  He can be in touch with me via text.  I did indicate to him end of the new drug is likely to be approved.  But in the fall we can look at IV infusion clinical trial 5 which would be once a month.       Latest Ref Rng & Units 12/17/2023    8:31 AM 05/13/2023   12:56 PM 01/04/2023    8:51 AM 10/09/2022    4:21 PM 04/07/2022    1:46 PM 01/09/2022    9:17 AM 09/18/2021    8:52 AM  PFT Results  FVC-Pre L 1.70  P 2.37  2.27  2.33  2.53  2.49  2.68   FVC-Predicted Pre % 46  P 64  62  63  68  64  68   Pre FEV1/FVC % % 80  P 86  84  86  86  87  84   FEV1-Pre L 1.36  P 2.03  1.92  2.01  2.18  2.16  2.25   FEV1-Predicted Pre % 53  P 78  74  77  83  78  80   DLCO uncorrected ml/min/mmHg 10.56  P 11.71  10.66  11.48  12.36  12.55  13.07   DLCO UNC% % 46  P 51  46  50  53  53  54   DLCO corrected ml/min/mmHg 11.41  P 11.71  10.66  12.81  13.57  12.55  14.64   DLCO COR %Predicted % 50  P 51  46  55  59  53  61   DLVA Predicted % 100  P 81  77  94  89  91  105     P Preliminary result

## 2023-12-17 NOTE — Patient Instructions (Signed)
 Medication Changes:  No changes  Lab Work:  Labs done today, your results will be available in MyChart, we will contact you for abnormal readings.   Testing/Procedures:    Referrals:    Special Instructions // Education:    Follow-Up in: 6 months ( December) ** PLEASE CALL THE OFFICE IN SEPTEMBER TO ARRANGE YOUR FOLLOW UP APPOINTMENT.**  At the [facility], you and your health needs are our priority. We have a designated team specialized in the treatment of Heart Failure. This Care Team includes your primary Heart Failure Specialized Cardiologist (physician), Advanced Practice Providers (APPs- Physician Assistants and Nurse Practitioners), and Pharmacist who all work together to provide you with the care you need, when you need it.   You may see any of the following providers on your designated Care Team at your next follow up:  Dr. Jules Oar Dr. Peder Bourdon Dr. Alwin Baars Dr. Judyth Nunnery Nieves Bars, NP Ruddy Corral, [place] [facility] [place], [place] Dennise Fitz, NP [place] Lee, NP Luster Salters, PharmD   Please be sure to bring in all your medications bottles to every appointment.   Need to Contact Us :  If you have any questions or concerns before your next appointment please send us  a message through [place] or call our office at [phone].    TO LEAVE A MESSAGE FOR THE NURSE SELECT OPTION 2, PLEASE LEAVE A MESSAGE INCLUDING: YOUR NAME DATE OF BIRTH CALL BACK NUMBER REASON FOR CALL**this is important as we prioritize the call backs  YOU WILL RECEIVE A CALL BACK THE SAME DAY AS LONG AS YOU CALL BEFORE 4:00 PM

## 2023-12-18 NOTE — Progress Notes (Signed)
 PCP: Kenneth Hun, MD Pulmonology: Dr. Bertrum [name] Cardiology: Dr. Mitzie [name]  Chief complaint: Shortness of breath  83 y.o. with history of pulmonary fibrosis, high grade B cell lymphoma, and prostate cancer was referred by Dr. Bertrum [name] for evaluation of dyspnea and tachycardia.  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  He is on Esbriet .  He has not used home oxygen .  He has baseline exertional dyspnea.  In 5/22, he had XRT for prostate cancer.  In 9/22, he was diagnosed with high grade B cell lymphoma and finished his 6th cycle of mini-CHOP in 4/23.  Pre-op echo in 9/22 showed EF 65-70%, normal RV, unable to estimate PASP, mild AS mean gradient 12 mmHg.  Patient has been noted by CT to have coronary calcification.   After completing CHOP in 4/23, patient developed persistent sinus tachycardia.  He felt palpitations.  D dimer was normal.  No evidence for infection.  Echo in 4/23 showed EF 55-60%, normal RV, unable to assess PA pressure (no TR jet), and nodular calcification of the mitral valve.  Due to mitral valve abnormality, blood cultures were done which were negative.  Sinus tachycardia eventually subsided on its own.   There was talk over doing CTA to further investigate etiology of dyspnea. Felt arranging LHC at time of RHC would make most sense.   Underwent [facility] 10/23 showing mild LAD lesion 40% stenosed, normal filling pressures, normal PA pressure, normal CO.  S/p cervical spinal decompression & fusion 08/11/22. Seen in ED 08/23/22 with palpitations. 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 Zio monitor in 3/24 showed 16% PVCs.  Toprol  XL increased to 25 mg bid.    Echo 3/25 showed EF 60-65%, normal RV, aortic sclerosis without stenosis, ascending aorta 4.0 cm. Patient had a COVID 19 episode in 3/25 requiring hospitalization.   Zio monitor in 5/25 showed 5.4% PVCs, 4 short NSVT runs.   Toprol  XL increased.   Patient presents for cardiology followup.  He has had poor appetite and fatigue recently as well as GI discomfort.  Dr. Bertrum [name] is going to hold his pirfenidone  to see if this helps.  No palpitations currently.  Doing pulmonary rehab.  No chest pain, stable chronic dyspnea.  No orthopnea/PND.  Not using home oxygen .   ECG (personally reviewed): NSR with PVC  Labs (4/23): D dimer negative, BNP 137, HS-TnI 13, K 4.1, creatinine 0.59, hgb 10.2, WBCs 12 Labs (5/23): K 4.4, creatinine 0.64, hgb 11.3 Labs (2/24): TSH and LFTs normal, K 4.0, creatinine 0.59, hgb 11.3, HS-Tnl 7>>7. Labs (4/24): K 3.9, creatinine 0.66 Labs (5/24): LDL 68 Labs (11/24): K 4, creatinine 0.66 Labs (3/25): K 4.5, creatinine 0.5, hgb 12.7, TSH normal Labs (4/25): TnI negative, pro-BNP normal, K 4.4, creatinine 0.68 Labs (5/25): K 4.3, creatinine 0.73  PMH: 1. IBS 2. 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  - CT chest in 9/22 with pulmonary fibrosis, enlarged PA, coronary artery calcium .  3. CAD: Coronary calcification on 9/22 CT chest.  - Cardiolite in 9/18 showed no ischemia or infarction.  - LHC (10/23): mild nonobstructive CAD with 40% mid LAD lesion 4. Hyperlipidemia 5. High grade B cell lymphoma: Diagnosed in 9/22.  He received 6 cycles of mini-CHOP.  - Echo (9/22): Prechemo, EF 65-70%, normal RV, unable to estimate PASP, mild AS mean gradient 12 mmHg.  - Echo (4/23): EF 55-60%, normal RV, unable to assess PA pressure (no TR jet),  and nodular calcification of the mitral valve.  - RHC (10/23): RA 2, PA 24/5 (mean 14), PCWP 5, CO/CI 6.26/3.29 - Echo (3/24): EF 55-60%, normal RV, mild-moderate AS with mean gradient 11 and AVA 1.52 cm^2.  - Echo (3/25): EF 60-65%, normal RV, aortic sclerosis without stenosis, ascending aorta 4.0 cm.  6. Aortic stenosis: Mild-moderate on 3/24 echo but minimal on 3/25 echo.  7. 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  8. H/o L-spine compression fracture.  9.  Sleep study 5/24 with mild OSA.  10. PVCs: Zio monitor (3/24) with 16% PVCs - Zio monitor (5/25): 5.4% PVCs, 4 short NSVT runs.  SH: Married, lives in [place], retired [employer] scientist, nonsmoker, rare ETOH.   Family History  Problem Relation Age of Onset   Heart failure Father 85   Bladder Cancer Mother    Breast cancer Mother    Colon cancer Neg Hx    Pancreatic cancer Neg Hx    Rectal cancer Neg Hx    Stomach cancer Neg Hx    Prostate cancer Neg Hx    ROS: All systems reviewed and negative except as per HPI.   Current Outpatient Medications  Medication Sig Dispense Refill   acetaminophen  (TYLENOL ) 500 MG tablet Take 1,000 mg by mouth every 6 (six) hours as needed for moderate pain.     acetaminophen -codeine  (TYLENOL  #3) 300-30 MG tablet Take 1 tablet by mouth every 4 (four) hours as needed (for pain).     albuterol  (VENTOLIN  HFA) 108 (90 Base) MCG/ACT inhaler Inhale 2 puffs into the lungs every 6 (six) hours as needed for wheezing or shortness of breath. 6.7 g 2   aspirin  81 MG chewable tablet Chew 81 mg by mouth 3 (three) times a week.     carboxymethylcellulose (REFRESH TEARS) 0.5 % SOLN Place 1 drop into both eyes daily as needed (dry eyes).     carisoprodol  (SOMA ) 250 MG tablet Take 175-350 mg by mouth 3 (three) times daily as needed (for muscle spasms).     Cholecalciferol  (VITAMIN D -3) 1000 UNITS CAPS Take 2,000 Units by mouth every evening.     CO-ENZYME Q-10 PO Take 1 tablet by mouth every evening.      Cyanocobalamin  (VITAMIN B12) 1000 MCG TBCR Take 1,000 mcg by mouth daily.     denosumab  (PROLIA ) 60 MG/ML SOSY injection Inject 60 mg into the skin every 6 (six) months. At [facility]     desvenlafaxine (PRISTIQ) 50 MG 24 hr tablet Take 50 mg by mouth daily.     diclofenac  (VOLTAREN ) 75 MG EC tablet Take 75 mg by mouth 2 (two) times daily as needed (pain.).  2   diclofenac  sodium (VOLTAREN ) 1 % GEL Apply 4 g topically 4 (four) times daily as needed (pain.).  12    dicyclomine  (BENTYL ) 10 MG capsule Take one by mouth every 6 hours as needed for abdominal cramping 30 capsule 1   diphenoxylate-atropine (LOMOTIL) 2.5-0.025 MG tablet Take 1 tablet by mouth as needed for diarrhea or loose stools.     Emollient (AQUAPHOR ADV PROTECT HEALING EX) Apply 1 Application topically daily as needed (itching).     Evolocumab (REPATHA) 140 MG/ML SOSY Inject 140 mg into the skin every 30 (thirty) days.     Glucosamine Sulfate 500 MG TABS Take 500 mg by mouth daily.     HYDROcodone -acetaminophen  (NORCO) 10-325 MG tablet Take 1 tablet by mouth See admin instructions. Take 1 tablet by mouth every 4-6 hours as needed for pain  latanoprost  (XALATAN ) 0.005 % ophthalmic solution Place 1 drop into both eyes at bedtime.     loratadine  (CLARITIN ) 10 MG tablet Take 10 mg by mouth daily.     meclizine  (ANTIVERT ) 25 MG tablet Take 25 mg by mouth 3 (three) times daily as needed for dizziness.     metoprolol  succinate (TOPROL -XL) 50 MG 24 hr tablet Take 1 tablet (50 mg total) by mouth in the morning and at bedtime. Take with or immediately following a meal.     Multiple Vitamin (MULTIVITAMIN) tablet Take 1 tablet by mouth daily with breakfast.     omeprazole  (PRILOSEC) 40 MG capsule Take 1 capsule (40 mg total) by mouth daily. Call our office at [phone] to schedule an appointment for additional refills. 30 capsule 0   ondansetron  (ZOFRAN ) 8 MG tablet Take 1 tablet (8 mg total) by mouth every 8 (eight) hours as needed for nausea or vomiting. 30 tablet 2   Pirfenidone  801 MG TABS TAKE 1 TABLET THREE TIMES A DAY WITH MEALS 270 tablet 5   Probiotic Product (ALIGN) 4 MG CAPS Take 4 mg by mouth daily.     SODIUM FLUORIDE 5000 PLUS 1.1 % CREA dental cream Place 1 Application onto teeth at bedtime.     XHANCE  93 MCG/ACT EXHU Place 1 spray into both nostrils 2 (two) times daily as needed (for sinus issues).     No current facility-administered medications for this encounter.   Wt Readings  from Last 3 Encounters:  12/17/23 74.6 kg (164 lb 6.4 oz)  12/17/23 74 kg (163 lb 3.2 oz)  12/14/23 74.5 kg (164 lb 3.9 oz)   BP 102/62   Pulse 80   Ht 5' 9 (1.753 m)   Wt 74.6 kg (164 lb 6.4 oz)   SpO2 94%   BMI 24.28 kg/m  General: NAD Neck: No JVD, no thyromegaly or thyroid  nodule.  Lungs: Dry crackles at bases.  CV: Nondisplaced PMI.  Heart regular S1/S2, no S3/S4, 1/6 SEM RUSB.  No peripheral edema.  No carotid bruit.  Normal pedal pulses.  Abdomen: Soft, nontender, no hepatosplenomegaly, no distention.  Skin: Intact without lesions or rashes.  Neurologic: Alert and oriented x 3.  Psych: Normal affect. Extremities: No clubbing or cyanosis.  HEENT: Normal.   Assessment/Plan: 1. PVCs/PACs: Zio monitor in 3/24 with 16% PVCs.  Toprol  XL was increased.  Echo in 3/24 and 3/25 showed normal EF.  Cath in 10/23 showed nonobstructive CAD.  Most recent Zio monitor in 5/25 showed less PVCs, down to 5.4%. No lightheadedness or syncope.  Recent TSH normal.  - Continue Toprol  XL 50 mg bid, recently increased.  - Mild OSA on sleep study, he does not want to use CPAP.  2. Exertional dyspnea: Suspect this is primarily due to IPF.  Echo in 3/24 showed EF 55-60%, normal RV, mild-moderate AS with mean gradient 11 and AVA 1.52 cm^2.  RHC (10/23) with normal filling pressures, normal PA pressure and normal CO. No indication for Tyvaso based on RHC. Echo in 3/25 showed EF 60-65%, normal RV, aortic sclerosis without stenosis, ascending aorta 4.0 cm. He is not volume overloaded on exam.    - Suspect progressive IPF is the main contributor to dyspnea.  - Check BNP today.  4. IPF: Long-standing, has been on Esbriet .  5. CAD: Coronary calcification noted. LHC (10/23) with mild, non-obstructive CAD; mild LAD 40% stenosed. No chest pain. - Continue ASA 81.  - Continue Repatha. Check lipids today.  6. Aortic stenosis:  Mild to moderate on 3/24 echo.  However, minimal on 3/25 echo.   Follow up 6 months   I  spent 21 minutes reviewing records, interviewing/examining patient, and managing orders.    Kenneth [name]  12/18/2023

## 2023-12-20 NOTE — Telephone Encounter (Signed)
 Called the pt and there was no answer- LMTCB

## 2023-12-20 NOTE — Telephone Encounter (Signed)
 I called and spoke with the pt and made him aware of results and recommendations from Dr. Bertrum Brodie. He verbalized understanding and states to let MR know that even though it's only been 3 days off of Esbriet , hi GI symptoms have lessened a great amount. This is just FYI. He is going to keep in touch as the week goes via text.

## 2023-12-20 NOTE — Telephone Encounter (Addendum)
 Pt returning [place] call.  Please call back.  Called office, but no answer from CMA , must be in room w/ pts, b/c no response.

## 2023-12-21 ENCOUNTER — Encounter (HOSPITAL_COMMUNITY)
Admission: RE | Admit: 2023-12-21 | Discharge: 2023-12-21 | Disposition: A | Discharge status: Home / Self Care | Source: Ambulatory Visit | Attending: Pulmonary Disease | Admitting: Pulmonary Disease

## 2023-12-21 DIAGNOSIS — M25551 Pain in right hip: Secondary | ICD-10-CM | POA: Diagnosis not present

## 2023-12-21 DIAGNOSIS — G8929 Other chronic pain: Secondary | ICD-10-CM | POA: Diagnosis not present

## 2023-12-21 DIAGNOSIS — J84112 Idiopathic pulmonary fibrosis: Secondary | ICD-10-CM

## 2023-12-21 DIAGNOSIS — M545 Low back pain, unspecified: Secondary | ICD-10-CM | POA: Diagnosis not present

## 2023-12-21 NOTE — Progress Notes (Signed)
 Daily Session Note  Patient Details  Name: Kenneth [name] MRN: [id number] Date of Birth: Oct 04, 1940 Referring Provider:   [name] Pulmonary Rehab Walk Test from 10/01/2023 in [facility]  Referring Provider [name]  [[name]]    Encounter Date: 12/21/2023  Check In:  Session Check In - 12/21/23 1125       Check-In   Supervising physician immediately available to respond to emergencies [facility] MD immediately available    Physician(s) Levin Reamer, NP    Location [facility]    Staff Present Sueellen Emery BS, ACSM-CEP, Exercise Physiologist;Kaylee Nolon Baxter, MS, ACSM-CEP, Exercise Physiologist;Casey Vernadine Golas Belarus, RD, Toy Freund, RN, BSN    Virtual Visit No    Medication changes reported     No    Fall or balance concerns reported    No    Tobacco Cessation No Change    Warm-up and Cool-down Performed as group-led instruction    Resistance Training Performed Yes    VAD Patient? No    PAD/SET Patient? No      Pain Assessment   Currently in Pain? No/denies    Multiple Pain Sites No          Capillary Blood Glucose: No results found for this or any previous visit (from the past 24 hours).    Social History   Tobacco Use  Smoking Status Never  Smokeless Tobacco Never    Goals Met:  Independence with exercise equipment Exercise tolerated well No report of concerns or symptoms today Strength training completed today  Goals Unmet:  Not Applicable  Comments: Service time is from 1016 to 1143.    Dr. Genetta Kenning is Medical Director for Pulmonary Rehab at [facility].

## 2023-12-23 ENCOUNTER — Encounter (HOSPITAL_COMMUNITY)
Admission: RE | Admit: 2023-12-23 | Discharge: 2023-12-23 | Disposition: A | Discharge status: Home / Self Care | Source: Ambulatory Visit | Attending: Pulmonary Disease | Admitting: Pulmonary Disease

## 2023-12-23 DIAGNOSIS — J84112 Idiopathic pulmonary fibrosis: Secondary | ICD-10-CM | POA: Diagnosis not present

## 2023-12-23 NOTE — Progress Notes (Signed)
 Daily Session Note  Patient Details  Name: Kenneth [name] MRN: [id number] Date of Birth: [birth year] Referring Provider:   [name] Pulmonary Rehab Walk Test from 10/01/2023 in [facility] for [facility], V[facility], & Lung Health  Referring Provider [name]  [[name]]    Encounter Date: 12/23/2023  Check In:  Session Check In - 12/23/23 1028       Check-In   Physician(s) Lawana Pray, NP    Location [facility]    Staff Present Sueellen Emery BS, ACSM-CEP, Exercise Physiologist;Phylisha Dix Nolon Baxter, MS, ACSM-CEP, Exercise Physiologist;Casey Carmen Chol, RN, BSN    Virtual Visit No    Medication changes reported     No    Fall or balance concerns reported    No    Tobacco Cessation No Change    Warm-up and Cool-down Performed as group-led instruction    Resistance Training Performed Yes    VAD Patient? No    PAD/SET Patient? No      Pain Assessment   Currently in Pain? No/denies    Pain Score 0-No pain    Multiple Pain Sites No          Capillary Blood Glucose: No results found for this or any previous visit (from the past 24 hours).    Social History   Tobacco Use  Smoking Status Never  Smokeless Tobacco Never    Goals Met:  Proper associated with RPD/PD & O2 Sat Exercise tolerated well No report of concerns or symptoms today Strength training completed today  Goals Unmet:  Not Applicable  Comments: Service time is from 1014 to 1145.  Dr. Genetta Kenning is Medical Director for Pulmonary Rehab at [facility].

## 2023-12-25 DIAGNOSIS — J9601 Acute respiratory failure with hypoxia: Secondary | ICD-10-CM | POA: Diagnosis not present

## 2023-12-25 DIAGNOSIS — U071 COVID-19: Secondary | ICD-10-CM | POA: Diagnosis not present

## 2023-12-28 ENCOUNTER — Encounter (HOSPITAL_COMMUNITY)
Admission: RE | Admit: 2023-12-28 | Discharge: 2023-12-28 | Disposition: A | Discharge status: Home / Self Care | Source: Ambulatory Visit | Attending: Pulmonary Disease | Admitting: Pulmonary Disease

## 2023-12-28 VITALS — Wt 160.1 lb

## 2023-12-28 DIAGNOSIS — J84112 Idiopathic pulmonary fibrosis: Secondary | ICD-10-CM | POA: Diagnosis not present

## 2023-12-28 NOTE — Progress Notes (Signed)
 Daily Session Note  Patient Details  Name: Kenneth [name] MRN: [id number] Date of Birth: 1941-01-18 Referring Provider:   Conrad Ports Pulmonary Rehab Walk Test from 10/01/2023 in [facility], [facility]  Referring Provider [name]  [[name]]    Encounter Date: 12/28/2023  Check In:  Session Check In - 12/28/23 1035       Check-In   Supervising physician immediately available to respond to emergencies [facility] MD immediately available    Physician(s) Josefa Beauvais, NP    [facility]    Staff Present Cloyd Aris BS, ACSM-CEP, Exercise Physiologist;Kaylee Nicholaus, MS, ACSM-CEP, Exercise Physiologist;Casey Claudene Candia Levin, RN, BSN    Virtual Visit No    Medication changes reported     No    Fall or balance concerns reported    No    Tobacco Cessation No Change    Warm-up and Cool-down Performed as group-led instruction    Resistance Training Performed Yes    VAD Patient? No    PAD/SET Patient? No      Pain Assessment   Currently in Pain? No/denies    Multiple Pain Sites No          Capillary Blood Glucose: No results found for this or any previous visit (from the past 24 hours).   Exercise Prescription Changes - 12/28/23 1200       Response to Exercise   Blood Pressure (Admit) 122/62    Blood Pressure (Exercise) 132/56    Blood Pressure (Exit) 100/68    Heart Rate (Admit) 66 bpm    Heart Rate (Exercise) 93 bpm    Heart Rate (Exit) 74 bpm    Oxygen  Saturation (Admit) 100 %   4L   Oxygen  Saturation (Exercise) 94 %   4L   Oxygen  Saturation (Exit) 94 %   RA   Rating of Perceived Exertion (Exercise) 13    Perceived Dyspnea (Exercise) 2    Duration Continue with 30 min of aerobic exercise without signs/symptoms of physical distress.    Intensity THRR unchanged      Progression   Progression Continue to progress workloads to maintain intensity without signs/symptoms of physical distress.       Resistance Training   Training Prescription Yes    Weight blue bands    Reps 10-15    Time 10 Minutes      Oxygen    Oxygen  Continuous    Liters 4      Treadmill   MPH 2    Grade 1    Minutes 15    METs 2.6      Bike   Level 3    Minutes 15    METs 3.1      Oxygen    Maintain Oxygen  Saturation 88% or higher          Social History   Tobacco Use  Smoking Status Never  Smokeless Tobacco Never    Goals Met:  Independence with exercise equipment Exercise tolerated well No report of concerns or symptoms today Strength training completed today  Goals Unmet:  Not Applicable  Comments: Service time is from 1018 to 1150    Dr. Slater Staff is Medical Director for Pulmonary Rehab at [facility].

## 2023-12-29 DIAGNOSIS — M5416 Radiculopathy, lumbar region: Secondary | ICD-10-CM | POA: Diagnosis not present

## 2023-12-29 NOTE — Telephone Encounter (Signed)
 Discussed with patient 48h ago and he ws feeling well. Asked him to resart pirfenidone  gradually . He will do that

## 2023-12-30 ENCOUNTER — Encounter (HOSPITAL_COMMUNITY)
Admission: RE | Admit: 2023-12-30 | Discharge: 2023-12-30 | Disposition: A | Discharge status: Home / Self Care | Source: Ambulatory Visit | Attending: Pulmonary Disease | Admitting: Pulmonary Disease

## 2023-12-30 DIAGNOSIS — J84112 Idiopathic pulmonary fibrosis: Secondary | ICD-10-CM

## 2023-12-30 NOTE — Progress Notes (Signed)
 Daily Session Note  Patient Details  Name: Kenneth [name] MRN: [id number] Date of Birth: 05-Oct-1940 Referring Provider:   Conrad Ports Pulmonary Rehab Walk Test from 10/01/2023 in [facility]  Referring Provider [name]  [[name]]    Encounter Date: 12/30/2023  Check In:  Session Check In - 12/30/23 1025       Check-In   Supervising physician immediately available to respond to emergencies [facility] MD immediately available    Physician(s) Carolyn Mose, NP    Location [facility]    Staff Present Cloyd Aris BS, ACSM-CEP, Exercise Physiologist;Lorene Klimas Nicholaus, MS, ACSM-CEP, Exercise Physiologist;Casey Claudene Candia Levin, RN, BSN    Virtual Visit No    Medication changes reported     No    Fall or balance concerns reported    No    Tobacco Cessation No Change    Warm-up and Cool-down Performed as group-led instruction    Resistance Training Performed Yes    VAD Patient? No    PAD/SET Patient? No      Pain Assessment   Currently in Pain? No/denies    Pain Score 0-No pain    Multiple Pain Sites No          Capillary Blood Glucose: No results found for this or any previous visit (from the past 24 hours).    Social History   Tobacco Use  Smoking Status Never  Smokeless Tobacco Never    Goals Met:  Proper associated with RPD/PD & O2 Sat Exercise tolerated well No report of concerns or symptoms today Strength training completed today  Goals Unmet:  Not Applicable  Comments: Service time is from 1017 to 1150.  Dr. Slater Staff is Medical Director for Pulmonary Rehab at [facility].

## 2023-12-31 NOTE — Progress Notes (Addendum)
 Discharge Progress Report  Patient Details  Name: Kenneth [name] MRN: [id number] Date of Birth: [birth year] Referring Provider:   Conrad Ports Pulmonary Rehab Walk Test from 10/01/2023 in [facility]  Referring Provider [name]  [[name]]     Number of Visits: 24  Reason for Discharge:  Patient reached a stable level of exercise. Patient independent in their exercise. Patient has met program and personal goals.  Smoking History:  Social History   Tobacco Use  Smoking Status Never  Smokeless Tobacco Never    Diagnosis:  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HCC)  ADL [facility]:  Pulmonary Assessment Scores     Row Name 10/01/23 0940 12/21/23 1518 12/31/23 0838     ADL [facility]   ADL Phase Entry Exit --   SOB Score total 47 36 --     CAT Score   CAT Score 12 11 --     mMRC Score   mMRC Score 1 -- 2      Initial Exercise Prescription:  Initial Exercise Prescription - 10/01/23 1000       Date of Initial Exercise RX and Referring Provider   Date 10/01/23    Referring Provider Orlene Staff   Expected Discharge Date 12/21/23      Oxygen    Oxygen  Continuous    Liters 3    Maintain Oxygen  Saturation 88% or higher      Treadmill   MPH 1.8    Grade 0    Minutes 15    METs 2      Bike   Level 1    Watts 20    Minutes 40    METs 1.5      Prescription Details   Frequency (times per week) 2    Duration Progress to 30 minutes of continuous aerobic without signs/symptoms of physical distress      Intensity   THRR 40-80% of Max Heartrate 56-135    Ratings of Perceived Exertion 11-13    Perceived Dyspnea 0-4      Progression   Progression Continue to progress workloads to maintain intensity without signs/symptoms of physical distress.      Resistance Training   Training Prescription Yes    Weight blue bands    Reps 10-15          Discharge Exercise Prescription (Final Exercise Prescription  Changes):  Exercise Prescription Changes - 12/28/23 1200       Response to Exercise   Blood Pressure (Admit) 122/62    Blood Pressure (Exercise) 132/56    Blood Pressure (Exit) 100/68    Heart Rate (Admit) 66 bpm    Heart Rate (Exercise) 93 bpm    Heart Rate (Exit) 74 bpm    Oxygen  Saturation (Admit) 100 %   4L   Oxygen  Saturation (Exercise) 94 %   4L   Oxygen  Saturation (Exit) 94 %   RA   Rating of Perceived Exertion (Exercise) 13    Perceived Dyspnea (Exercise) 2    Duration Continue with 30 min of aerobic exercise without signs/symptoms of physical distress.    Intensity THRR unchanged      Progression   Progression Continue to progress workloads to maintain intensity without signs/symptoms of physical distress.      Resistance Training   Training Prescription Yes    Weight blue bands    Reps 10-15    Time 10 Minutes      Oxygen   Oxygen  Continuous    Liters 4      Treadmill   MPH 2    Grade 1    Minutes 15    METs 2.6      Bike   Level 3    Minutes 15    METs 3.1      Oxygen    Maintain Oxygen  Saturation 88% or higher          Functional Capacity:  6 Minute Walk     Row Name 10/01/23 1030 12/30/23 1156       6 Minute Walk   Phase Initial Discharge    Distance 810 feet 1330 feet    Distance % Change -- 69.2 %    Distance Feet Change -- 520 ft    Walk Time 6 minutes 6 minutes    # of Rest Breaks 2  1:41-2:00, 3:36-4:03 0    MPH 1.53 2.52    METS 1.87 2.56    RPE 13 12.5    Perceived Dyspnea  2 3    VO2 Peak 6.56 8.97    Symptoms No No    Resting HR 110 bpm 72 bpm    Resting BP 122/64 118/58    Resting Oxygen  Saturation  91 % 98 %    Exercise Oxygen  Saturation  during 6 min walk 86 % 90 %    Max Ex. HR 122 bpm 99 bpm    Max Ex. BP 132/70 126/60    2 Minute Post BP 128/70 116/60      Interval HR   1 Minute HR 110 83    2 Minute HR 96 90    3 Minute HR 120 93    4 Minute HR 122 95    5 Minute HR 97 97    6 Minute HR 121 99    2 Minute  Post HR 92 65    Interval Heart Rate? Yes Yes      Interval Oxygen    Interval Oxygen ? Yes Yes    Baseline Oxygen  Saturation % 91 % 98 %    1 Minute Oxygen  Saturation % 87 % 99 %    1 Minute Liters of Oxygen  0 L  increased to 1L 4 L    2 Minute Oxygen  Saturation % 86 % 96 %    2 Minute Liters of Oxygen  1 L  increased to 2L 4 L    3 Minute Oxygen  Saturation % 88 % 92 %    3 Minute Liters of Oxygen  2 L  increased to 3L 4 L    4 Minute Oxygen  Saturation % 89 % 92 %    4 Minute Liters of Oxygen  3 L 4 L    5 Minute Oxygen  Saturation % 91 % 901 %    5 Minute Liters of Oxygen  3 L 4 L    6 Minute Oxygen  Saturation % 89 % 90 %    6 Minute Liters of Oxygen  3 L 4 L    2 Minute Post Oxygen  Saturation % 94 % 98 %    2 Minute Post Liters of Oxygen  3 L 4 L       Psychological, QOL, Others - Outcomes: PHQ 2/9:    12/21/2023    3:17 PM 05/21/2017   11:19 AM  Depression screen PHQ 2/9  Decreased Interest 1 0  Down, Depressed, Hopeless 1 0  PHQ - 2 Score 2 0  Altered sleeping 0   Tired, decreased energy 2  Change in appetite 2   Feeling bad or failure about yourself  0   Trouble concentrating 0   Moving slowly or fidgety/restless 0   Suicidal thoughts 0   PHQ-9 Score 6   Difficult doing work/chores Not difficult at all     Quality of Life:   Personal Goals: Goals established at orientation with interventions provided to work toward goal.  Personal Goals and Risk Factors at Admission - 10/01/23 0932       Core Components/Risk Factors/Patient Goals on Admission   Improve shortness of breath with ADL's Yes    Intervention Provide education, individualized exercise plan and daily activity instruction to help decrease symptoms of SOB with activities of daily living.    Expected Outcomes Short Term: Improve cardiorespiratory fitness to achieve a reduction of symptoms when performing ADLs;Long Term: Be able to perform more ADLs without symptoms or delay the onset of symptoms            Personal Goals Discharge:  Goals and Risk Factor Review     Row Name 10/13/23 1119 11/10/23 1030 12/08/23 0938 12/31/23 0827       Core Components/Risk Factors/Patient Goals Review   Personal Goals Review Improve shortness of breath with ADL's;Develop more efficient breathing techniques such as purse lipped breathing and diaphragmatic breathing and practicing self-pacing with activity.;Increase knowledge of respiratory medications and ability to use respiratory devices properly. Improve shortness of breath with ADL's;Develop more efficient breathing techniques such as purse lipped breathing and diaphragmatic breathing and practicing self-pacing with activity.;Increase knowledge of respiratory medications and ability to use respiratory devices properly. Improve shortness of breath with ADL's;Increase knowledge of respiratory medications and ability to use respiratory devices properly. Improve shortness of breath with ADL's;Increase knowledge of respiratory medications and ability to use respiratory devices properly.    Review Monthly review of patient's Core Components/Risk Factors/Patient Goals are as follows: Goal progressing for improving shortness of breath with ADL's. Caileb is currently using 6L O2 to maintain sats >88% while exercising. He is currently exercising on the treadmill and the bike. Goal progressing for developing more efficient breathing techniques such as purse lipped breathing and diaphragmatic breathing; and practicing self-pacing with activity. Goal progressing for increasing knowledge of respiratory medications and the ability to use respiratroy devices properly. We will continue to monitor Alec progress throughout the program. Monthly review of patient's Core Components/Risk Factors/Patient Goals are as follows: Goal progressing for improving shortness of breath with ADL's. Darrious is currently using 4-6L O2 to maintain sats >88% while exercising. He is currently exercising on the  treadmill and the bike. Goal met for developing more efficient breathing techniques such as purse lipped breathing and diaphragmatic breathing; and practicing self-pacing with activity. He has attended the breathing technique education and has been practicing diaphragmatic breathing at home. He is able to demonstrate purse lip breathing when he gets SOB and knows how to self pace based on the RPE/dyspnea scale. Goal progressing for increasing knowledge of respiratory medications and the ability to use respiratroy devices properly. We will continue to monitor Coreyon progress throughout the program. Monthly review of patient's Core Components/Risk Factors/Patient Goals are as follows: Goal progressing for improving shortness of breath with ADL's. Bazil is currently using 4-6L O2 to maintain sats >88% while exercising. He is currently exercising on the treadmill and the bike. Goal progressing for increasing knowledge of respiratory medications and the ability to use respiratroy devices properly. We will continue to monitor Migel's progress throughout the program. Rockey graduated  the Pulm Rehab program on 12/30/23 completing 24 sessions. Graduation review of patient's Core Components/Risk Factors/Patient Goals are as follows: Goal met for improving shortness of breath with ADL's. Winslow is currently using 4L O2 to maintain sats >88% while exercising. He exercised on the treadmill and the upright bike. He was able to increase his speed and incline on the treadmill and workload on the bile. His shortness of breath score decreased from 47 to 36 and CAT score decreased from a 12 to an 11. He stated that he feels better than he has in a long time. Goal met for increasing knowledge of respiratory medications and the ability to use respiratory devices properly. Our respiratory therapist reviewed medications with Judea, how to use, when to use, side effects, and answered all questions. He demonstrated use of his albuterol  inhaler with  her. Malakie enjoyed the program and is going to continue exercising at [employer].    Expected Outcomes To improve shortness of breath with ADL's, develop more efficient breathing techniques such as purse lipped breathing and diaphragmatic breathing; and practicing self-pacing with activity and  increase knowledge of respiratory medications and the ability to use respiratroy devices properly. To improve shortness of breath with ADL's and  increase knowledge of respiratory medications and the ability to use respiratroy devices properly. To improve shortness of breath with ADL's and  increase knowledge of respiratory medications and the ability to use respiratroy devices properly. For Rockey to continue to improve his shortness of breath with ADL's and continue exercising post Pulm Rehab       Exercise Goals and Review:  Exercise Goals     Row Name 10/01/23 9071             Exercise Goals   Increase Physical Activity Yes       Intervention Provide advice, education, support and counseling about physical activity/exercise needs.;Develop an individualized exercise prescription for aerobic and resistive training based on initial evaluation findings, risk stratification, comorbidities and participant's personal goals.       Expected Outcomes Short Term: Attend rehab on a regular basis to increase amount of physical activity.;Long Term: Add in home exercise to make exercise part of routine and to increase amount of physical activity.;Long Term: Exercising regularly at least 3-5 days a week.       Increase Strength and Stamina Yes       Intervention Provide advice, education, support and counseling about physical activity/exercise needs.;Develop an individualized exercise prescription for aerobic and resistive training based on initial evaluation findings, risk stratification, comorbidities and participant's personal goals.       Expected Outcomes Short Term: Increase workloads from initial exercise  prescription for resistance, speed, and METs.;Short Term: Perform resistance training exercises routinely during rehab and add in resistance training at home;Long Term: Improve cardiorespiratory fitness, muscular endurance and strength as measured by increased METs and functional capacity ( )       Able to understand and use rate of perceived exertion (RPE) scale Yes       Intervention Provide education and explanation on how to use RPE scale       Expected Outcomes Short Term: Able to use RPE daily in rehab to express subjective intensity level;Long Term:  Able to use RPE to guide intensity level when exercising independently       Able to understand and use Dyspnea scale Yes       Intervention Provide education and explanation on how to use Dyspnea scale  Expected Outcomes Short Term: Able to use Dyspnea scale daily in rehab to express subjective sense of shortness of breath during exertion;Long Term: Able to use Dyspnea scale to guide intensity level when exercising independently       Knowledge and understanding of Target Heart Rate Range (THRR) Yes       Intervention Provide education and explanation of THRR including how the numbers were predicted and where they are located for reference       Expected Outcomes Short Term: Able to state/look up THRR;Short Term: Able to use daily as guideline for intensity in rehab;Long Term: Able to use THRR to govern intensity when exercising independently       Understanding of Exercise Prescription Yes       Intervention Provide education, explanation, and written materials on patient's individual exercise prescription       Expected Outcomes Short Term: Able to explain program exercise prescription;Long Term: Able to explain home exercise prescription to exercise independently          Exercise Goals Re-Evaluation:  Exercise Goals Re-Evaluation     Row Name 10/13/23 0851 11/11/23 1610 12/10/23 0832 12/30/23 1202       Exercise Goal  Re-Evaluation   Exercise Goals Review Increase Physical Activity;Able to understand and use Dyspnea scale;Understanding of Exercise Prescription;Increase Strength and Stamina;Knowledge and understanding of Target Heart Rate Range (THRR);Able to understand and use rate of perceived exertion (RPE) scale Increase Physical Activity;Able to understand and use Dyspnea scale;Understanding of Exercise Prescription;Increase Strength and Stamina;Knowledge and understanding of Target Heart Rate Range (THRR);Able to understand and use rate of perceived exertion (RPE) scale Increase Physical Activity;Able to understand and use Dyspnea scale;Understanding of Exercise Prescription;Increase Strength and Stamina;Knowledge and understanding of Target Heart Rate Range (THRR);Able to understand and use rate of perceived exertion (RPE) scale Increase Physical Activity;Able to understand and use Dyspnea scale;Understanding of Exercise Prescription;Increase Strength and Stamina;Knowledge and understanding of Target Heart Rate Range (THRR);Able to understand and use rate of perceived exertion (RPE) scale    Comments Seeley has completed 3 exercise sessions. He exercises for 15 min on the treadmill and upright bike. He averages 2.2 METs at 1.8 mph on the treadmill and 2.8 METs at level 2 on the recumbent elliptical. Rockey performs the warmup and cooldown standing without limitations. It is too soon to notate any discernable progressions. Will continue to monitor and progress as able. Toran has completed 11 exercise sessions. He exercises for 15 min on the treadmill and upright bike. He averages 2.4 METs at 2 mph on the treadmill and 2.8 METs at level 2.2 on the upright bike. Ediel performs the warmup and cooldown standing without limitations. Carmine has increased his speed on the treadmill and level on the bike. He tolerates progressions well. Will continue to monitor and progress as able. Joaquim has completed 18 exercise sessions. He exercises  for 15 min on the treadmill and upright bike. He averages 2.6 METs at 2 mph and 1% on the treadmill and 3.2 METs at level 3 on the upright bike. Codey performs the warmup and cooldown standing without limitations. Agustus continue to increase his speed on the treadmill and level on the bike. He still tolerates progressions well. Laterrance had set back recently due to a fall. He is still motivated to exercise. Will continue to monitor and progress as able. Pt completed 24 exercise sessions. Peak METs were 2.6 on treadmill and 3.1 on upright bike.    Expected Outcomes Through exercise at rehab  and home, the patient will decrease shortness of breath with daily activities and feel confident in carrying out an exercise regimen at home. Through exercise at rehab and home, the patient will decrease shortness of breath with daily activities and feel confident in carrying out an exercise regimen at home. Through exercise at rehab and home, the patient will decrease shortness of breath with daily activities and feel confident in carrying out an exercise regimen at home. Through exercise at rehab and home, the patient will decrease shortness of breath with daily activities and feel confident in carrying out an exercise regimen at home.       Nutrition & Weight - Outcomes:    Nutrition:  Nutrition Therapy & Goals - 12/30/23 1127       Nutrition Therapy   Diet General Healthy Diet      Personal Nutrition Goals   Nutrition Goal Patient to maintain weight throughout pulmonary rehab and understand strategies for weight maintenance/weight gain as needed.   goal in progress.   Comments Goal in progress. Mckenna has medical history of IPF, lymphoma, OSA, CAD, aortic stenosis, diastolic heart failure. Lipids are well controlled on repatha. He does report side effects of nausea, diarrhea, constipation, decreased appetitie on esbriet . He did recently take a 10 day break from Esbriet  (see pulmonary notes 12/17/23) and reports that  all GI symptoms resolved. He is down 5.7# since starting with our program. He lives at [facility] and typically eats 1-2 meals on campus. He has increased to 2-3 nutrition supplements (Ensure Plus 350kcals, 16g protein each) per day. We have discussed multiple strategies for weight gain/weight maintenance including increasing calorie density of foods, nutrition supplements, eating frequency, weight monitoring, etc.  Patient will benefit from adherence to  nutrition, exercise, and lifestyle modification recommendations.      Intervention Plan   Intervention Prescribe, educate and counsel regarding individualized specific dietary modifications aiming towards targeted core components such as weight, hypertension, lipid management, diabetes, heart failure and other comorbidities.;Nutrition handout(s) given to patient.    Expected Outcomes Short Term Goal: Understand basic principles of dietary content, such as calories, fat, sodium, cholesterol and nutrients.;Long Term Goal: Adherence to prescribed nutrition plan.          Nutrition Discharge:  Nutrition Assessments - 12/21/23 1520       Rate Your Plate Scores   Post Score 51          Education Questionnaire Score:  Knowledge Questionnaire Score - 12/21/23 1518       Knowledge Questionnaire Score   Pre Score 18/18    Post Score 18/18          Ladislav graduated the program on 12/30/23 completing 24 sessions. At time of discharge Hazem continued to deny any psychosocial barriers.   Graduation review of patient's Core Components/Risk Factors/Patient Goals are as follows: Goal met for improving shortness of breath with ADL's. Goodwin is currently using 4L O2 to maintain sats >88% while exercising. He exercised on the treadmill and the upright bike. He was able to increase his speed and incline on the treadmill and workload on the bile. His shortness of breath score decreased from 47 to 36 and CAT score decreased from a 12 to an 11. He stated that  he feels better than he has in a long time. Goal met for increasing knowledge of respiratory medications and the ability to use respiratory devices properly. Our respiratory therapist reviewed medications with Rockne, how to use, when to use, side effects,  and answered all questions. He demonstrated use of his albuterol  inhaler with her. Meldon enjoyed the program and is going to continue exercising at [employer].   Goals reviewed with patient; copy given to patient.

## 2024-01-02 ENCOUNTER — Other Ambulatory Visit: Payer: Self-pay | Admitting: Internal Medicine

## 2024-01-06 ENCOUNTER — Other Ambulatory Visit: Payer: Self-pay | Admitting: Internal Medicine

## 2024-01-13 DIAGNOSIS — M5416 Radiculopathy, lumbar region: Secondary | ICD-10-CM | POA: Diagnosis not present

## 2024-01-24 DIAGNOSIS — I509 Heart failure, unspecified: Secondary | ICD-10-CM | POA: Diagnosis not present

## 2024-01-24 DIAGNOSIS — E871 Hypo-osmolality and hyponatremia: Secondary | ICD-10-CM | POA: Diagnosis not present

## 2024-01-24 DIAGNOSIS — C8510 Unspecified B-cell lymphoma, unspecified site: Secondary | ICD-10-CM | POA: Diagnosis not present

## 2024-01-24 DIAGNOSIS — I2510 Atherosclerotic heart disease of native coronary artery without angina pectoris: Secondary | ICD-10-CM | POA: Diagnosis not present

## 2024-01-24 DIAGNOSIS — M810 Age-related osteoporosis without current pathological fracture: Secondary | ICD-10-CM | POA: Diagnosis not present

## 2024-01-24 DIAGNOSIS — I5033 Acute on chronic diastolic (congestive) heart failure: Secondary | ICD-10-CM | POA: Diagnosis not present

## 2024-01-24 DIAGNOSIS — I493 Ventricular premature depolarization: Secondary | ICD-10-CM | POA: Diagnosis not present

## 2024-01-24 DIAGNOSIS — U071 COVID-19: Secondary | ICD-10-CM | POA: Diagnosis not present

## 2024-01-24 DIAGNOSIS — I272 Pulmonary hypertension, unspecified: Secondary | ICD-10-CM | POA: Diagnosis not present

## 2024-01-24 DIAGNOSIS — J84112 Idiopathic pulmonary fibrosis: Secondary | ICD-10-CM | POA: Diagnosis not present

## 2024-01-24 DIAGNOSIS — E785 Hyperlipidemia, unspecified: Secondary | ICD-10-CM | POA: Diagnosis not present

## 2024-01-24 DIAGNOSIS — F32A Depression, unspecified: Secondary | ICD-10-CM | POA: Diagnosis not present

## 2024-01-24 DIAGNOSIS — C61 Malignant neoplasm of prostate: Secondary | ICD-10-CM | POA: Diagnosis not present

## 2024-01-24 DIAGNOSIS — J9601 Acute respiratory failure with hypoxia: Secondary | ICD-10-CM | POA: Diagnosis not present

## 2024-01-31 ENCOUNTER — Other Ambulatory Visit (HOSPITAL_COMMUNITY): Payer: Self-pay | Admitting: Cardiology

## 2024-01-31 MED ORDER — METOPROLOL SUCCINATE ER 50 MG PO TB24: 50.0000 mg | ORAL_TABLET | Freq: Two times a day (BID) | ORAL | 11 refills | Status: AC

## 2024-02-24 DIAGNOSIS — J9601 Acute respiratory failure with hypoxia: Secondary | ICD-10-CM | POA: Diagnosis not present

## 2024-02-24 DIAGNOSIS — U071 COVID-19: Secondary | ICD-10-CM | POA: Diagnosis not present

## 2024-03-13 ENCOUNTER — Ambulatory Visit: Admitting: Internal Medicine

## 2024-03-14 DIAGNOSIS — L570 Actinic keratosis: Secondary | ICD-10-CM | POA: Diagnosis not present

## 2024-03-14 DIAGNOSIS — L821 Other seborrheic keratosis: Secondary | ICD-10-CM | POA: Diagnosis not present

## 2024-03-14 DIAGNOSIS — L905 Scar conditions and fibrosis of skin: Secondary | ICD-10-CM | POA: Diagnosis not present

## 2024-03-14 DIAGNOSIS — Z85828 Personal history of other malignant neoplasm of skin: Secondary | ICD-10-CM | POA: Diagnosis not present

## 2024-03-26 DIAGNOSIS — J9601 Acute respiratory failure with hypoxia: Secondary | ICD-10-CM | POA: Diagnosis not present

## 2024-03-26 DIAGNOSIS — U071 COVID-19: Secondary | ICD-10-CM | POA: Diagnosis not present

## 2024-03-26 DIAGNOSIS — J84112 Idiopathic pulmonary fibrosis: Secondary | ICD-10-CM | POA: Diagnosis not present

## 2024-03-27 DIAGNOSIS — H401121 Primary open-angle glaucoma, left eye, mild stage: Secondary | ICD-10-CM | POA: Diagnosis not present

## 2024-03-27 DIAGNOSIS — H401112 Primary open-angle glaucoma, right eye, moderate stage: Secondary | ICD-10-CM | POA: Diagnosis not present

## 2024-03-29 NOTE — Telephone Encounter (Signed)
 I just called Dr. Neysa since he had called back in yesterday wanting Prior Authorization done for new Rx for Xhance . I was out yesterday due to office being closed. I inform Dr. Neysa that I have faxed all paper work needed to try to get XHANCE  approved, we may have to see him if denied due to no office visits since 2020.

## 2024-03-29 NOTE — Telephone Encounter (Signed)
 Pt called he said yesterday wanting P/A for xhance . I called [facility] and they stated that they no longer carry Xhance . Pt called back in today wanting to know why he has not gotten a call yet on medication refill. I left message for pt that I will have to address w/Dr. Ethyl since we have not seen pt since 07/29/2018.

## 2024-03-30 DIAGNOSIS — M533 Sacrococcygeal disorders, not elsewhere classified: Secondary | ICD-10-CM | POA: Diagnosis not present

## 2024-04-08 DIAGNOSIS — Z23 Encounter for immunization: Secondary | ICD-10-CM | POA: Diagnosis not present

## 2024-04-11 DIAGNOSIS — M533 Sacrococcygeal disorders, not elsewhere classified: Secondary | ICD-10-CM | POA: Diagnosis not present

## 2024-04-13 ENCOUNTER — Other Ambulatory Visit (HOSPITAL_COMMUNITY): Payer: Self-pay | Admitting: Internal Medicine

## 2024-04-13 DIAGNOSIS — M810 Age-related osteoporosis without current pathological fracture: Secondary | ICD-10-CM | POA: Insufficient documentation

## 2024-04-14 ENCOUNTER — Telehealth (HOSPITAL_COMMUNITY): Payer: Self-pay | Admitting: Pharmacy Technician

## 2024-04-14 NOTE — Telephone Encounter (Signed)
 Auth Submission: NO AUTH NEEDED Site of care: [facility] Payer: [employer] Medication & CPT/J Code(s) submitted: Prolia  (Denosumab ) [id number] Diagnosis Code: M81.0 Route of submission (phone, fax, portal):  Phone # Fax # Auth type: Buy/Bill HB Units/visits requested: 60mg  x 2 doses, q 6 months Reference number:  Approval from: 07/07/23 to 07/05/24  Not a new start. Last dose 11/03/23.   Murlene Revell, CPhT [facility] Phone: [phone] 04/14/2024

## 2024-04-25 DIAGNOSIS — J84112 Idiopathic pulmonary fibrosis: Secondary | ICD-10-CM | POA: Diagnosis not present

## 2024-04-25 DIAGNOSIS — J9601 Acute respiratory failure with hypoxia: Secondary | ICD-10-CM | POA: Diagnosis not present

## 2024-04-25 DIAGNOSIS — U071 COVID-19: Secondary | ICD-10-CM | POA: Diagnosis not present

## 2024-04-26 DIAGNOSIS — J849 Interstitial pulmonary disease, unspecified: Secondary | ICD-10-CM | POA: Diagnosis not present

## 2024-04-26 DIAGNOSIS — Z79899 Other long term (current) drug therapy: Secondary | ICD-10-CM | POA: Diagnosis not present

## 2024-04-26 DIAGNOSIS — J9611 Chronic respiratory failure with hypoxia: Secondary | ICD-10-CM | POA: Diagnosis not present

## 2024-04-26 DIAGNOSIS — J84112 Idiopathic pulmonary fibrosis: Secondary | ICD-10-CM | POA: Diagnosis not present

## 2024-04-26 DIAGNOSIS — R0602 Shortness of breath: Secondary | ICD-10-CM | POA: Diagnosis not present

## 2024-05-01 ENCOUNTER — Other Ambulatory Visit (HOSPITAL_COMMUNITY): Payer: Self-pay | Admitting: Cardiology

## 2024-05-03 DIAGNOSIS — M5416 Radiculopathy, lumbar region: Secondary | ICD-10-CM | POA: Diagnosis not present

## 2024-05-08 ENCOUNTER — Encounter (HOSPITAL_COMMUNITY)
Admission: RE | Admit: 2024-05-08 | Discharge: 2024-05-08 | Disposition: A | Discharge status: Home / Self Care | Source: Ambulatory Visit | Attending: Internal Medicine | Admitting: Internal Medicine

## 2024-05-08 VITALS — BP 111/62 | HR 84 | Temp 97.8°F | Resp 17

## 2024-05-08 DIAGNOSIS — M810 Age-related osteoporosis without current pathological fracture: Secondary | ICD-10-CM | POA: Insufficient documentation

## 2024-05-08 MED ORDER — DENOSUMAB 60 MG/ML ~~LOC~~ SOSY
60.0000 mg | PREFILLED_SYRINGE | Freq: Once | SUBCUTANEOUS | Status: AC
  Administered 2024-05-08: 60 mg via SUBCUTANEOUS

## 2024-05-08 MED ORDER — DENOSUMAB 60 MG/ML ~~LOC~~ SOSY
PREFILLED_SYRINGE | SUBCUTANEOUS | Status: AC
  Filled 2024-05-08: qty 1

## 2024-05-10 ENCOUNTER — Telehealth: Payer: Self-pay

## 2024-05-10 ENCOUNTER — Encounter: Payer: Self-pay | Admitting: Internal Medicine

## 2024-05-10 ENCOUNTER — Telehealth: Payer: Self-pay | Admitting: Internal Medicine

## 2024-05-10 ENCOUNTER — Ambulatory Visit (INDEPENDENT_AMBULATORY_CARE_PROVIDER_SITE_OTHER): Admitting: Internal Medicine

## 2024-05-10 VITALS — BP 112/66 | HR 92 | Ht 69.0 in | Wt 165.4 lb

## 2024-05-10 DIAGNOSIS — Z5181 Encounter for therapeutic drug level monitoring: Secondary | ICD-10-CM

## 2024-05-10 DIAGNOSIS — R198 Other specified symptoms and signs involving the digestive system and abdomen: Secondary | ICD-10-CM

## 2024-05-10 DIAGNOSIS — R110 Nausea: Secondary | ICD-10-CM | POA: Diagnosis not present

## 2024-05-10 DIAGNOSIS — J84112 Idiopathic pulmonary fibrosis: Secondary | ICD-10-CM | POA: Diagnosis not present

## 2024-05-10 NOTE — Patient Instructions (Addendum)
 ICD-10-CM   1. 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HCC)  J84.112 Ambulatory referral to Pharmacotherapy Clinic    2. Encounter for therapeutic drug monitoring  Z51.81 Ambulatory referral to Pharmacotherapy Clinic    3. Alternating constipation and diarrhea  R19.8     4. Nausea  R11.0         IPF: Though progressive over time compared to earlier this year symptom score and exercise hypoxemia test and pulmonary function test [do] appear to be stable physically are also much stronger after undergoing and ongoing pulmonary rehabilitation at [facility]  Agree the pirfenidone  is causing significant side effects : Fatigue, nausea, alternating constipation and diarrhea   PLAN -Start Nerandomilast [application done] -Once you start this then stop p Esbriet /pirfenidone  f  - Were not doing add-on therapy but substitution  - Do not recommend nintedanib for you given GI issues. -Continue pulm rehabilitation - Oxygen  with exertion - Agree with you doing pulmonary function test at [facility] because of better customer service there is this aspect. - We can consider you for clinical research trials particularly infusion study but it depends on where you end up with Nerandomilast.  Follow-up - 3 months 30-minute visit   - 6 weeks already scheduled 12/17/23

## 2024-05-10 NOTE — Progress Notes (Signed)
 OV 12/17/2023  Subjective:  Patient ID: Kenneth [name], male , DOB: [birth year] , age 83 y.o. , MRN: [id number] , ADDRESS: [address] [place] [address] [place] [facility] [address] PCP Shayne Anes, MD Patient Care Team: Shayne Anes, MD as PCP - General (Internal Medicine) Grayce Buddle, RN as Oncology Nurse Navigator O'Kelley, Sari SQUIBB, RN as Oncology Nurse Navigator  This Provider for this visit: Treatment Team:  Attending Provider: Geronimo Amel, MD     12/17/2023 -   Chief Complaint  Patient presents with   Follow-up     HPI Kenneth [name] 83 y.o. -returns for follow-up.  He is now attending pulmonary rehabilitation which he states helps him.  The using 4 L of nasal cannula oxygen .  His weight is stable.  [place] the [place] in his [place] home and he fell down and took a bruise to his left lower back but is otherwise doing well.  His main issue is that he did this to pirfenidone .  While he is aware of this he is somewhat reluctant to stop it.  He is also dealing with alternating constipation and diarrhea partly because of Zofran  for nausea and also the opioids he takes.  Then he has to follow this up with laxatives.  I advised 1 week of rest from pirfenidone  but he is somewhat reluctant but he agreed.  He is seeing other doctors for his fatigue but I did tell him to do a drug holiday as a test to see if fatigue is related to pirfenidone .  He did have his pulmonary function test this result was not available at the time of the visit but clinically appears stable from the previous visit [see symptom scores below] but overall he is dealing with progressive disease.  Later got the pulmonary function test and definitely his decline compared to November 2024.  In the interim.  He did have his COVID.    Last Weight  Most recent update: 12/17/2023 11:40 AM    Weight  74 kg (163 lb 3.2 oz)               OV 05/10/2024  Subjective:  Patient ID: Kenneth [name], male , DOB: 1941-03-24 , age  58 y.o. , MRN: [id number] , ADDRESS: [address] [place] [address] [place] [address] PCP Shayne Anes, MD Patient Care Team: Shayne Anes, MD as PCP - General (Internal Medicine) Grayce Buddle, RN as Oncology Nurse Navigator O'Kelley, Sari SQUIBB, RN as Oncology Nurse Navigator  This Provider for this visit: Treatment Team:  Attending Provider: Geronimo Amel, MD    Follow-up idiopathic pulmonary fibrosis.  Diagnosis towards the end of 2018.  On pirfenidone .  Last CT scan of the chest September 2020. 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  Depression and started on Pristiq by primary care physician in 2023  Esbriet /Pirfenidone  requires intensive drug monitoring due to high concerns for Adverse effects of , including  Drug Induced Liver Injury, significant GI side effects that include but not limited to Diarrhea, Nausea, Vomiting,  and other system side effects that include Fatigue, headaches, weight loss and other side effects such as skin rash. These will  be monitored with  blood work such as LFT initially once a month for 6 months and then quarterly   05/10/2024 -   Chief Complaint  Patient presents with   Medical Management of Chronic Issues   Interstitial Lung Disease    Breathing has been stable overall and he denies any new concerns.      HPI Kenneth [name] 83 y.o. -Kenneth [name] is an 83 year old male with pulmonary fibrosis who presents for follow-up regarding his medication regimen and pulmonary rehabilitation.    He is currently taking Esbriet  and experiences side effects such as nausea and diarrhea. He manages these with Imodium and nausea medication. He recently visited [facility] for pulmonary function tests and a six-minute walk test. He attends pulmonary rehabilitation twice a week, which he finds beneficial for his leg strength.  In fact this is really helped his exercise test today.  He speed is much better.  He is very pleased about this.  He saw [facility] Dr. Mardy Solon .  Here pulmonary function test that I reviewed Medical record is stable.  He also had oxygen  titration test.  It appears that he stable.  Because of his issues with pirfenidone  tolerance they discussed Nerandomilast versus nintedanib.  Per his history Dr Solon was leaning towards nintedanib.  I did educate him about Nerandomilast and the diarrhea side effects.  Told him that it is noninferior to pirfenidone  and the side effect profile overall statistically is better and given his ongoing issues with GI side effects with pirfenidone  that I would recommend it as a substitute.  And then based on progression we could add pirfenidone  again or nintedanib at that point.  He is open to this idea.  Insurance approval might be a problem but we decided we would come into the pathway and try.  He is interested in clinical trials.  I talked about IV infusion study but will opt to see whether he gets approved for Nerandomilast or not because that could be an explosion.  He  is up-to-date with his flu shot    He mentions a past medical history of prostate cancer and lymphoma, and he has been managing back pain with injections. He has also been involved in pulmonary rehabilitation, which he finds helpful for his overall strength and mobility.   SYMPTOM SCALE - ILD 08/23/2018  10/22/2020 170# - esbriet  and XRT for prostate 04/03/2021 168# 06/17/2021 168# lymphoma 10/13/2022 164# esbiret 01/04/2023 164# Esbret full dose 05/13/2023 Esbriet  + Teton 301 study 167# 09/16/2023 167# Covid admit feb 2025 11/04/2023 166# 12/17/2023 163# esbriet  full does 05/10/2024 Esbiret full dose 165#  O2 use ra  ra ra ra ra ra  ra 4L wth exertion O2 with eertion no change  Shortness of Breath 0 -> 5  scale with 5 being worst (score 6 If unable to do)            At rest 0 0 0 0 0 0 0 0 1 0  0  Simple tasks - showers, clothes change, eating, shaving *0** 0 0 0 1 1 1 2 4 1 2   Household (dishes, doing bed, laundry) 0 0 1 1 2 3 2 2 5 2  3  Shopping 0 0 0 1 2 3 2 3 4 3  3  Walking level at own pace 0 0 0 1 2 1 1 3 2 3  3  Walking up Stairs 2 2 2 3 4 4 3 5 5 5 5   Total (40 - 48) Dyspnea Score 2 2 3 6 11 12 9 15 21 14 16   How bad is your cough? 0 0 0 1 0 0 0 0 0 0  0  How bad is your fatigue 1 due to esbriet  2 1 3.5 2.5 3 3 3  3.5 3 2.5  nausea  1 1 3 2 3 2 1 2 3 3  esbrit  vomit  0 0 0 0 0 0 0 1 2 0   diarrhea  3 1 2 2 1  0 1 1 3 1   anxiety  0 1 1 1 2 1 1 1 1 1   depresso0  0 1 1 1 2 1 0 0 1 1           SIT STAND TEST - goal 15 times   11/04/2023  05/10/2024   O2 used Room air 5-6 min Room air  PRobe - finter or forehead forehead finger  Number sit and stand completed - goal 15 15 15   Time taken to complete 1 min 37 sec  Resting Pulse Ox/HR/Dyspnea  97% and 83/min and dyspnea of 3/10  94% and HR 92 and score 3  Peak measures 9 % and 96/min and dyspnea of 8/10 90% and HR 98 and score 7  Final Pulse Ox/HR 94% and 93/min and dyspnea of 6/10 90% and HR 87 and score 3  Desaturated </= 88%  no yes  Desaturated <= 3% points yes yes  Got Tachycardic >/= 90/min yes yes  Miscellaneous comments 2 points 3 ponts but did it faster     PFT     Latest Ref Rng & Units 10/222/5 at [facility] 12/17/2023    8:31 AM 05/13/2023   12:56 PM 01/04/2023    8:51 AM 10/09/2022    4:21 PM 04/07/2022    1:46 PM 01/09/2022    9:17 AM 09/18/2021    8:52 AM  PFT Results  FVC-Pre L 1.96L 1.70  2.37  2.27  2.33  2.53  2.49  2.68   FVC-Predicted Pre %  46  64  62  63  68  64  68   Pre FEV1/FVC % %  80  86  84  86  86  87  84   FEV1-Pre L  1.36  2.03  1.92  2.01  2.18  2.16  2.25   FEV1-Predicted Pre %  53  78  74  77  83  78  80   DLCO uncorrected ml/min/mmHg  10.56  11.71  10.66  11.48  12.36  12.55  13.07   DLCO UNC% %  46  51  46  50  53  53  54   DLCO corrected ml/min/mmHg  11.41  11.71  10.66  12.81  13.57  12.55  14.64   DLCO COR %  Predicted %  50  51  46  55  59  53  61   DLVA Predicted %  100  81  77  94  89  91  105        LAB RESULTS last 96 hours No results found.       has a past medical history of Arthritis, Depression, Diffuse large B cell lymphoma (HCC) (2023), Diverticulosis of colon (without mention of hemorrhage), GERD (gastroesophageal reflux disease), Hiatal hernia, HLD (hyperlipidemia), Internal hemorrhoids without mention of complication, Irritable bowel syndrome, Other specified gastritis without mention of hemorrhage, Prostate cancer (HCC) (2022), Pulmonary fibrosis (HCC), and Stress fracture (02/2021).   reports that he has never smoked. He has never used smokeless tobacco.  Past Surgical History:  Procedure Laterality Date   ANTERIOR CERVICAL DECOMP/DISCECTOMY FUSION N/A 08/11/2022   Procedure: Anterior Cervical Decompression/Discectomy Fusion - Cervical Four-Cervical Five -  Cervical Five-Cervical Six - Cerivcal Six-Cerival Seven;  Surgeon: Louis Shove, MD;  Location: [facility] OR;  Service: Neurosurgery;  Laterality: N/A;   CARPAL TUNNEL RELEASE     left   CATARACT EXTRACTION   06/2011   bilateral   DEEP NECK LYMPH NODE BIOPSY / EXCISION  03/25/2021   INGUINAL HERNIA REPAIR Right 09/05/2019   Procedure: OPEN REPAIR RIGHT INGUINAL HERNIA WITH MESH;  Surgeon: Eletha Boas, MD;  Location: [facility] ORS;  Service: General;  Laterality: Right;   LUMBAR DISC SURGERY  1986, 1995   x 2   LUMBAR LAMINECTOMY/DECOMPRESSION MICRODISCECTOMY Left 07/11/2013   Procedure: LUMBAR ONE TO TWO, LUMBAR TWO TO THREE, LUMBAR THREE TO FOUR LUMBAR LAMINECTOMY/DECOMPRESSION MICRODISCECTOMY 3 LEVELS;  Surgeon: Shove DELENA Louis, MD;  Location: [facility] NEURO ORS;  Service: Neurosurgery;  Laterality: Left;   NASAL SINUS SURGERY     RIGHT/LEFT HEART CATH AND CORONARY ANGIOGRAPHY N/A 04/14/2022   Procedure: RIGHT/LEFT HEART CATH AND CORONARY ANGIOGRAPHY;  Surgeon: Rolan Ezra RAMAN, MD;  Location: [facility] INVASIVE CV LAB;  Service: Cardiovascular;  Laterality: N/A;    No Known Allergies  Immunization History  Administered Date(s) Administered   Fluad Quad(high Dose 65+) 03/18/2020   Fluzone Influenza virus vaccine,trivalent (IIV3), split virus 03/28/2010, 04/14/2011, 03/22/2012, 03/09/2013   INFLUENZA, HIGH DOSE SEASONAL PF 03/19/2016, 03/06/2017, 04/09/2018, 03/07/2019, 04/06/2020, 04/11/2022   Influenza Split 10/03/2009, 03/28/2014   Influenza, Quadrivalent, Recombinant, Inj, Pf 03/12/2018, 03/10/2019, 04/06/2020, 04/07/2023, 04/08/2024   Influenza,inj,Quad PF,6+ Mos 04/04/2014, 03/26/2015, 04/05/2016   Influenza-Unspecified 04/05/2016, 04/05/2021, 04/25/2023   Moderna Sars-Covid-2 Vaccination 04/28/2024   PFIZER Comirnaty(Gray Top)Covid-19 Tri-Sucrose Vaccine 10/15/2020   PFIZER(Purple Top)SARS-COV-2 Vaccination 07/20/2019, 08/10/2019, 02/27/2020, 03/23/2021   PNEUMOCOCCAL CONJUGATE-20 02/18/2021   Pfizer Covid-19 Vaccine Bivalent Booster 5y-11y 10/05/2022   Pneumococcal Conjugate-13 03/30/2013, 07/31/2013, 04/26/2018   Pneumococcal Polysaccharide-23 10/03/2009, 01/14/2010, 03/31/2018, 02/18/2021    Pneumococcal-Unspecified 04/05/2016   Td 10/20/2016   Tdap 10/03/2009   Unspecified SARS-COV-2 Vaccination 03/21/2023   Zoster Recombinant(Shingrix) 01/03/2017   Zoster, Live 10/03/2009    Family History  Problem Relation Age of Onset   Heart failure Father 47   Bladder Cancer Mother    Breast cancer Mother    Colon cancer Neg Hx    Pancreatic cancer Neg Hx    Rectal cancer Neg Hx    Stomach cancer Neg Hx    Prostate cancer Neg Hx      Current Outpatient Medications:    acetaminophen  (TYLENOL ) 500 MG tablet, Take 1,000 mg by mouth every 6 (six) hours as needed for moderate pain., Disp: , Rfl:    acetaminophen -codeine  (  TYLENOL  #3) 300-30 MG tablet, Take 1 tablet by mouth every 4 (four) hours as needed (for pain)., Disp: , Rfl:    albuterol  (VENTOLIN  HFA) 108 (90 Base) MCG/ACT inhaler, Inhale 2 puffs into the lungs every 6 (six) hours as needed for wheezing or shortness of breath., Disp: 6.7 g, Rfl: 2   aspirin  81 MG chewable tablet, Chew 81 mg by mouth 3 (three) times a week., Disp: , Rfl:    carboxymethylcellulose (REFRESH TEARS) 0.5 % SOLN, Place 1 drop into both eyes daily as needed (dry eyes)., Disp: , Rfl:    carisoprodol  (SOMA ) 250 MG tablet, Take 175-350 mg by mouth 3 (three) times daily as needed (for muscle spasms)., Disp: , Rfl:    Cholecalciferol  (VITAMIN D -3) 1000 UNITS CAPS, Take 2,000 Units by mouth every evening., Disp: , Rfl:    CO-ENZYME Q-10 PO, Take 1 tablet by mouth every evening. , Disp: , Rfl:    Cyanocobalamin  (VITAMIN B12) 1000 MCG TBCR, Take 1,000 mcg by mouth daily., Disp: , Rfl:    denosumab  (PROLIA ) 60 MG/ML SOSY injection, Inject 60 mg into the skin every 6 (six) months. At [facility], Disp: , Rfl:    desvenlafaxine (PRISTIQ) 50 MG 24 hr tablet, Take 50 mg by mouth daily., Disp: , Rfl:    diclofenac  (VOLTAREN ) 75 MG EC tablet, Take 75 mg by mouth 2 (two) times daily as needed (pain.)., Disp: , Rfl: 2   diclofenac  sodium (VOLTAREN ) 1 % GEL, Apply  4 g topically 4 (four) times daily as needed (pain.)., Disp: , Rfl: 12   dicyclomine  (BENTYL ) 10 MG capsule, Take one by mouth every 6 hours as needed for abdominal cramping, Disp: 30 capsule, Rfl: 1   diphenoxylate-atropine (LOMOTIL) 2.5-0.025 MG tablet, Take 1 tablet by mouth as needed for diarrhea or loose stools., Disp: , Rfl:    Emollient (AQUAPHOR ADV PROTECT HEALING EX), Apply 1 Application topically daily as needed (itching)., Disp: , Rfl:    Evolocumab (REPATHA) 140 MG/ML SOSY, Inject 140 mg into the skin every 30 (thirty) days., Disp: , Rfl:    Glucosamine Sulfate 500 MG TABS, Take 500 mg by mouth daily., Disp: , Rfl:    HYDROcodone -acetaminophen  (NORCO) 10-325 MG tablet, Take 1 tablet by mouth See admin instructions. Take 1 tablet by mouth every 4-6 hours as needed for pain, Disp: , Rfl:    latanoprost  (XALATAN ) 0.005 % ophthalmic solution, Place 1 drop into both eyes at bedtime., Disp: , Rfl:    loratadine  (CLARITIN ) 10 MG tablet, Take 10 mg by mouth daily., Disp: , Rfl:    meclizine  (ANTIVERT ) 25 MG tablet, Take 25 mg by mouth 3 (three) times daily as needed for dizziness., Disp: , Rfl:    metoprolol  succinate (TOPROL -XL) 50 MG 24 hr tablet, Take 1 tablet (50 mg total) by mouth in the morning and at bedtime. Take with or immediately following a meal., Disp: 60 tablet, Rfl: 11   Multiple Vitamin (MULTIVITAMIN) tablet, Take 1 tablet by mouth daily with breakfast., Disp: , Rfl:    omeprazole  (PRILOSEC) 40 MG capsule, Take 1 capsule (40 mg total) by mouth daily. Call our office at [phone] to schedule an appointment for additional refills., Disp: 30 capsule, Rfl: 0   ondansetron  (ZOFRAN ) 8 MG tablet, Take 1 tablet (8 mg total) by mouth every 8 (eight) hours as needed for nausea or vomiting., Disp: 30 tablet, Rfl: 2   Pirfenidone  801 MG TABS, TAKE 1 TABLET THREE TIMES A DAY WITH MEALS,  Disp: 270 tablet, Rfl: 5   Probiotic Product (ALIGN) 4 MG CAPS, Take 4 mg by mouth daily., Disp: , Rfl:     SODIUM FLUORIDE 5000 PLUS 1.1 % CREA dental cream, Place 1 Application onto teeth at bedtime., Disp: , Rfl:    XHANCE  93 MCG/ACT EXHU, Place 1 spray into both nostrils 2 (two) times daily as needed (for sinus issues)., Disp: , Rfl:       Objective:   Vitals:   05/10/24 1419  BP: 112/66  Pulse: 92  SpO2: 94%  Weight: 165 lb 6.4 oz (75 kg)  Height: 5' 9 (1.753 m)    Estimated body mass index is 24.43 kg/m as calculated from the following:   Height as of this encounter: 5' 9 (1.753 m).   Weight as of this encounter: 165 lb 6.4 oz (75 kg).  @WEIGHTCHANGE @  Filed Weights   05/10/24 1419  Weight: 165 lb 6.4 oz (75 kg)     Physical Exam   General: No distress. Looks well O2 at rest: no Cane present: no Sitting in wheel chair: no Frail: no Obese: no Neuro: Alert and Oriented x 3. GCS 15. Speech normal Psych: Pleasant Resp:  Barrel Chest - no.  Wheeze - no, Crackles - YES BASE, No overt respiratory distress CVS: Normal heart sounds. Murmurs - no Ext: Stigmata of Connective Tissue Disease - no HEENT: Normal upper airway. PEERL +. No post nasal drip        Assessment/     Assessment & Plan 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HCC)  Encounter for therapeutic drug monitoring  Alternating constipation and diarrhea  Nausea  ( Level 05 visit E&M 2024: Estb >= 40 min n  visit type: on-site physical face to visit  in total care time and counseling or/and coordination of care by this undersigned MD - Dr Dorethia Cave. This includes one or more of the following on this same day 05/10/2024: pre-charting, chart review, note writing, documentation discussion of test results, diagnostic or treatment recommendations, prognosis, risks and benefits of management options, instructions, education, compliance or risk-factor reduction. It excludes time spent by the CMA or office staff in the care of the patient. Actual time 50 min)   PLAN Patient Instructions     ICD-10-CM    1. 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HCC)  J84.112 Ambulatory referral to Pharmacotherapy Clinic    2. Encounter for therapeutic drug monitoring  Z51.81 Ambulatory referral to Pharmacotherapy Clinic    3. Alternating constipation and diarrhea  R19.8     4. Nausea  R11.0         IPF: Though progressive over time compared to earlier this year symptom score and exercise hypoxemia test and pulmonary function test [do] appear to be stable physically are also much stronger after undergoing and ongoing pulmonary rehabilitation at [facility]  Agree the pirfenidone  is causing significant side effects : Fatigue, nausea, alternating constipation and diarrhea   PLAN -Start Nerandomilast [application done] -Once you start this then stop p Esbriet /pirfenidone  f  - Were not doing add-on therapy but substitution  - Do not recommend nintedanib for you given GI issues. -Continue pulm rehabilitation - Oxygen  with exertion - Agree with you doing pulmonary function test at [facility] because of better customer service there is this aspect. - We can consider you for clinical research trials particularly infusion study but it depends on where you end up with Nerandomilast.  Follow-up - 3 months 30-minute visit   - 6 weeks already scheduled  12/17/23    FOLLOWUP    Return for - 3 months 30-minute visit.    SIGNATURE    Dr. Dorethia Cave, M.D., F.C.C.P,  Pulmonary and Critical Care Medicine Staff Physician, Medical [facility] Center Director - Interstitial Lung Disease  Program  Pulmonary Fibrosis [facility] at [facility] [place], [place], [address]  Pager: [phone], If no answer or between  15:00h - 7:00h: call [phone] Telephone: [phone]  3:01 PM 05/10/2024   Moderate Complexity MDM OFFICE  2021 E/M guidelines, first released in 2021, with minor revisions added in 2023 and 2024 Must meet the requirements for 2 out of 3 dimensions  to qualify.    Number and complexity of problems addressed Amount and/or complexity of data reviewed Risk of complications and/or morbidity  One or more chronic illness with mild exacerbation, OR progression, OR  side effects of treatment  Two or more stable chronic illnesses  One undiagnosed new problem with uncertain prognosis  One acute illness with systemic symptoms   One Acute complicated injury Must meet the requirements for 1 of 3 of the categories)  Category 1: Tests and documents, historian  Any combination of 3 of the following:  Assessment requiring an independent historian  Review of prior external note(s) from each unique source  Review of results of each unique test  Ordering of each unique test    Category 2: Interpretation of tests   Independent interpretation of a test performed by another physician/other qualified health care professional (not separately reported)  Category 3: Discuss management/tests  Discussion of management or test interpretation with external physician/other qualified health care professional/appropriate source (not separately reported) Moderate risk of morbidity from additional diagnostic testing or treatment Examples only:  Prescription drug management  Decision regarding minor surgery with identfied patient or procedure risk factors  Decision regarding elective major surgery without identified patient or procedure risk factors  Diagnosis or treatment significantly limited by social determinants of health             HIGh Complexity  OFFICE   2021 E/M guidelines, first released in 2021, with minor revisions added in 2023. Must meet the requirements for 2 out of 3 dimensions to qualify.    Number and complexity of problems addressed Amount and/or complexity of data reviewed Risk of complications and/or morbidity  Severe exacerbation of chronic illness  Acute or chronic illnesses that may pose a threat to life or  bodily function, e.g., multiple trauma, acute MI, pulmonary embolus, severe respiratory distress, progressive rheumatoid arthritis, psychiatric illness with potential threat to self or others, peritonitis, acute renal failure, abrupt change in neurological status Must meet the requirements for 2 of 3 of the categories)  Category 1: Tests and documents, historian  Any combination of 3 of the following:  Assessment requiring an independent historian  Review of prior external note(s) from each unique source  Review of results of each unique test  Ordering of each unique test    Category 2: Interpretation of tests    Independent interpretation of a test performed by another physician/other qualified health care professional (not separately reported)  Category 3: Discuss management/tests  Discussion of management or test interpretation with external physician/other qualified health care professional/appropriate source (not separately reported)  HIGH risk of morbidity from additional diagnostic testing or treatment Examples only:  Drug therapy requiring intensive monitoring for toxicity  Decision for elective major surgery with identified pateint or procedure risk  factors  Decision regarding hospitalization or escalation of level of care  Decision for DNR or to de-escalate care   Parenteral controlled  substances            LEGEND - Independent interpretation involves the interpretation of a test for which there is a CPT code, and an interpretation or report is customary. When a review and interpretation of a test is performed and documented by the provider, but not separately reported (billed), then this would represent an independent interpretation. This report does not need to conform to the usual standards of a complete report of the test. This does not include interpretation of tests that do not have formal reports such as a complete blood count with differential and  blood cultures. Examples would include reviewing a chest radiograph and documenting in the medical record an interpretation, but not separately reporting (billing) the interpretation of the chest radiograph.   An appropriate source includes professionals who are not health care professionals but may be involved in the management of the patient, such as a clinical research associate, upper officer, case manager or teacher, and does not include discussion with family or informal caregivers.    - SDOH: SDOH are the conditions in the environments where people are born, live, learn, work, play, worship, and age that affect a wide range of health, functioning, and quality-of-life outcomes and risks. (e.g., housing, food insecurity, transportation, etc.). SDOH-related Z codes ranging from Z55-Z65 are the ICD-10-CM diagnosis codes used to document SDOH data Z55 - Problems related to education and literacy Z56 - Problems related to employment and unemployment Z57 - Occupational exposure to risk factors Z58 - Problems related to physical environment Z59 - Problems related to housing and economic circumstances [phone] - Problems related to social environment [phone] - Problems related to upbringing [phone] - Other problems related to primary support group, including family circumstances Z79 - Problems related to certain psychosocial circumstances Z65 - Problems related to other psychosocial circumstances

## 2024-05-10 NOTE — Telephone Encounter (Signed)
 Kenneth [name]  =  has sigiivant esbreit intolerance. Plese change to full dose nerandromilast. Do not advise ovfev due tO  Gi issues      Received message above from Dr. Geronimo in separate thread. Initiating benefits investigation in this thread.

## 2024-05-10 NOTE — Telephone Encounter (Signed)
 Kenneth [name]  =  has sigiivant esbreit intolerance. Plese change to full dose nerandromilast. Do not advise ovfev due tO  Gi issues

## 2024-05-10 NOTE — Telephone Encounter (Signed)
 Opening benefits investigation in separate thread.

## 2024-05-11 ENCOUNTER — Other Ambulatory Visit (HOSPITAL_COMMUNITY): Payer: Self-pay

## 2024-05-11 ENCOUNTER — Ambulatory Visit: Attending: Internal Medicine

## 2024-05-11 DIAGNOSIS — J84112 Idiopathic pulmonary fibrosis: Secondary | ICD-10-CM

## 2024-05-11 MED ORDER — JASCAYD 18 MG PO TABS: 18.0000 mg | ORAL_TABLET | Freq: Two times a day (BID) | ORAL | 1 refills | Status: DC

## 2024-05-11 NOTE — Progress Notes (Signed)
 [facility]  Referring Provider: Dr. Geronimo  Virtual Visit via Telephone Note  I connected with [name] on 05/11/24 at 1:50 PM by telephone and verified that I am speaking with the correct person using two identifiers.  Location: Patient: home Provider: office   I discussed the limitations, risks, security and privacy concerns of performing an evaluation and management service by telephone and the availability of in person appointments. I also discussed with the patient that there may be a patient responsible charge related to this service. The patient expressed understanding and agreed to proceed.  Subjective:  Patient called today by [facility] team for Kenneth [name] new start.   Patient was last seen by Dr. Geronimo on 05/10/24.  Pertinent past medical history includes IPF. Prior therapy includes Esbriet  (significant intolerance). Not a good candidate for nintedanib due to GI issues. Plan to initiate Kenneth [name].   Objective: No Known Allergies  Outpatient Encounter Medications as of 05/11/2024  Medication Sig   acetaminophen  (TYLENOL ) 500 MG tablet Take 1,000 mg by mouth every 6 (six) hours as needed for moderate pain.   acetaminophen -codeine  (TYLENOL  #3) 300-30 MG tablet Take 1 tablet by mouth every 4 (four) hours as needed (for pain).   albuterol  (VENTOLIN  HFA) 108 (90 Base) MCG/ACT inhaler Inhale 2 puffs into the lungs every 6 (six) hours as needed for wheezing or shortness of breath.   aspirin  81 MG chewable tablet Chew 81 mg by mouth 3 (three) times a week.   carboxymethylcellulose (REFRESH TEARS) 0.5 % SOLN Place 1 drop into both eyes daily as needed (dry eyes).   carisoprodol  (SOMA ) 250 MG tablet Take 175-350 mg by mouth 3 (three) times daily as needed (for muscle spasms).   Cholecalciferol  (VITAMIN D -3) 1000 UNITS CAPS Take 2,000 Units by mouth every evening.   CO-ENZYME Q-10 PO Take 1 tablet by mouth every evening.     Cyanocobalamin  (VITAMIN B12) 1000 MCG TBCR Take 1,000 mcg by mouth daily.   denosumab  (PROLIA ) 60 MG/ML SOSY injection Inject 60 mg into the skin every 6 (six) months. At [facility]   desvenlafaxine (PRISTIQ) 50 MG 24 hr tablet Take 50 mg by mouth daily.   diclofenac  (VOLTAREN ) 75 MG EC tablet Take 75 mg by mouth 2 (two) times daily as needed (pain.).   diclofenac  sodium (VOLTAREN ) 1 % GEL Apply 4 g topically 4 (four) times daily as needed (pain.).   dicyclomine  (BENTYL ) 10 MG capsule Take one by mouth every 6 hours as needed for abdominal cramping   diphenoxylate-atropine (LOMOTIL) 2.5-0.025 MG tablet Take 1 tablet by mouth as needed for diarrhea or loose stools.   Emollient (AQUAPHOR ADV PROTECT HEALING EX) Apply 1 Application topically daily as needed (itching).   Evolocumab (REPATHA) 140 MG/ML SOSY Inject 140 mg into the skin every 30 (thirty) days.   Glucosamine Sulfate 500 MG TABS Take 500 mg by mouth daily.   HYDROcodone -acetaminophen  (NORCO) 10-325 MG tablet Take 1 tablet by mouth See admin instructions. Take 1 tablet by mouth every 4-6 hours as needed for pain   latanoprost  (XALATAN ) 0.005 % ophthalmic solution Place 1 drop into both eyes at bedtime.   loratadine  (CLARITIN ) 10 MG tablet Take 10 mg by mouth daily.   meclizine  (ANTIVERT ) 25 MG tablet Take 25 mg by mouth 3 (three) times daily as needed for dizziness.   metoprolol  succinate (TOPROL -XL) 50 MG 24 hr tablet Take 1 tablet (50 mg total) by mouth in the morning and at  bedtime. Take with or immediately following a meal.   Multiple Vitamin (MULTIVITAMIN) tablet Take 1 tablet by mouth daily with breakfast.   omeprazole  (PRILOSEC) 40 MG capsule Take 1 capsule (40 mg total) by mouth daily. Call our office at [phone] to schedule an appointment for additional refills.   ondansetron  (ZOFRAN ) 8 MG tablet Take 1 tablet (8 mg total) by mouth every 8 (eight) hours as needed for nausea or vomiting.   Pirfenidone  801 MG TABS TAKE 1  TABLET THREE TIMES A DAY WITH MEALS   Probiotic Product (ALIGN) 4 MG CAPS Take 4 mg by mouth daily.   SODIUM FLUORIDE 5000 PLUS 1.1 % CREA dental cream Place 1 Application onto teeth at bedtime.   XHANCE  93 MCG/ACT EXHU Place 1 spray into both nostrils 2 (two) times daily as needed (for sinus issues).   No facility-administered encounter medications on file as of 05/11/2024.     Immunization History  Administered Date(s) Administered   Fluad Quad(high Dose 65+) 03/18/2020   Fluzone Influenza virus vaccine,trivalent (IIV3), split virus 03/28/2010, 04/14/2011, 03/22/2012, 03/09/2013   INFLUENZA, HIGH DOSE SEASONAL PF 03/19/2016, 03/06/2017, 04/09/2018, 03/07/2019, 04/06/2020, 04/11/2022   Influenza Split 10/03/2009, 03/28/2014   Influenza, Quadrivalent, Recombinant, Inj, Pf 03/12/2018, 03/10/2019, 04/06/2020, 04/07/2023, 04/08/2024   Influenza,inj,Quad PF,6+ Mos 04/04/2014, 03/26/2015, 04/05/2016   Influenza-Unspecified 04/05/2016, 04/05/2021, 04/25/2023   Moderna Sars-Covid-2 Vaccination 04/28/2024   PFIZER Comirnaty(Gray Top)Covid-19 Tri-Sucrose Vaccine 10/15/2020   PFIZER(Purple Top)SARS-COV-2 Vaccination 07/20/2019, 08/10/2019, 02/27/2020, 03/23/2021   PNEUMOCOCCAL CONJUGATE-20 02/18/2021   Pfizer Covid-19 Vaccine Bivalent Booster 5y-11y 10/05/2022   Pneumococcal Conjugate-13 03/30/2013, 07/31/2013, 04/26/2018   Pneumococcal Polysaccharide-23 10/03/2009, 01/14/2010, 03/31/2018, 02/18/2021   Pneumococcal-Unspecified 04/05/2016   Td 10/20/2016   Tdap 10/03/2009   Unspecified SARS-COV-2 Vaccination 03/21/2023   Zoster Recombinant(Shingrix) 01/03/2017   Zoster, Live 10/03/2009      CMP     Component Value Date/Time   NA 133 (L) 11/22/2023 0912   K 4.3 11/22/2023 0912   CL 96 (L) 11/22/2023 0912   CO2 29 11/22/2023 0912   GLUCOSE 177 (H) 11/22/2023 0912   BUN 10 11/22/2023 0912   CREATININE 0.72 11/22/2023 0912   CALCIUM  9.2 11/22/2023 0912   PROT 6.6 11/22/2023 0912    ALBUMIN  4.2 11/22/2023 0912   AST 20 11/22/2023 0912   ALT 12 11/22/2023 0912   ALKPHOS 60 11/22/2023 0912   BILITOT 0.4 11/22/2023 0912   GFRNONAA >60 11/22/2023 0912   GFRAA >60 06/01/2017 2144      CBC    Component Value Date/Time   WBC 6.0 11/22/2023 0912   WBC 6.8 11/03/2023 1544   RBC 3.98 (L) 11/22/2023 0912   HGB 12.2 (L) 11/22/2023 0912   HCT 35.6 (L) 11/22/2023 0912   PLT 223 11/22/2023 0912   MCV 89.4 11/22/2023 0912   MCH 30.7 11/22/2023 0912   MCHC 34.3 11/22/2023 0912   RDW 13.7 11/22/2023 0912   LYMPHSABS 0.9 11/22/2023 0912   MONOABS 0.5 11/22/2023 0912   EOSABS 0.3 11/22/2023 0912   BASOSABS 0.0 11/22/2023 0912      LFT's    Latest Ref Rng & Units 11/22/2023    9:12 AM 11/03/2023    4:06 PM 09/16/2023    3:20 PM  Hepatic Function  Total Protein 6.5 - 8.1 g/dL 6.6  5.9  6.9   Albumin  3.5 - 5.0 g/dL 4.2  4.0  4.3   AST 15 - 41 U/L 20  19  21    ALT 0 - 44 U/L  12  11  40   Alk Phosphatase 38 - 126 U/L 60  55  45   Total Bilirubin 0.0 - 1.2 mg/dL 0.4  0.2  0.4   Bilirubin, Direct 0.0 - 0.2 mg/dL  0.1        HRCT (89/96/75) IMPRESSION: 1. Pulmonary parenchymal pattern of fibrosis, as detailed above, stable from 08/23/2022 but progressive from 03/19/2021. Findings are consistent with UIP per consensus guidelines: Diagnosis of Idiopathic Pulmonary Fibrosis: An Official ATS/ERS/JRS/ALAT Clinical Practice Guideline. Am JINNY Honey Crit Care Med Vol 198, Iss 5, [phone], Mar 06 2017.  Assessment and Plan  Kenneth [name] Medication Management Thoroughly counseled patient on the efficacy, mechanism of action, dosing, administration, adverse effects, and monitoring parameters of Kenneth [name].  Patient verbalized understanding.   Goals of Therapy: Will not stop or reverse the progression of ILD. It will slow the progression of ILD.   Dosing: Recommended dose will be 18mg  1 tablet twice daily. May be administered with or without regard to food.   Adverse  Effects: Weight loss (nerandomilast monotherapy: 8%; background nintedanib: 14-16%; background pirfenidone : 6%) Decreased appetite (nerandomilast monotherapy: 6% to 9%; concomitant nintedanib: 7% to 10%; concomitant pirfenidone : 13%) Diarrhea (nerandomilast monotherapy: 17% to 26%; concomitant nintedanib: 50% to 62%; concomitant pirfenidone : 24%)  Monitoring: Monitor for diarrhea, decreased appetite, weight loss  Access: Approval of Kenneth [name] through: insurance Copay: $0  Rx sent to: [facility]: [phone]   Medication Reconciliation A drug regimen assessment was performed, including review of allergies, interactions, disease-state management, dosing and immunization history. Medications were reviewed with the patient, including name, instructions, indication, goals of therapy, potential side effects, importance of adherence, and safe use.  PLAN:  1) START Kenneth [name] 18mg  1 tablet twice daily. 2) STOP Esbriet /pirfenidone . 3) Rx for [facility] sent to [facility]: [phone]  4) Patient requests email with summary of information. He declines MyChart message. Email sent. Email content was pasted into AVS.  5) Follow-up with Dr. Geronimo as planned on 08/11/24.   This appointment required 20 minutes of patient care (this includes precharting, chart review, review of results, virtual care, etc.).  Thank you for involving pharmacy to assist in providing this patient's care.   I discussed the assessment and treatment plan with the patient. The patient was provided an opportunity to ask questions and all were answered. The patient agreed with the plan and demonstrated an understanding of the instructions.   The patient was advised to call back or seek an in-person evaluation if the symptoms worsen or if the condition fails to improve as anticipated.  Aleck Puls, PharmD, BCPS, CPP Clinical Pharmacist  [facility]

## 2024-05-11 NOTE — Telephone Encounter (Signed)
 Received notification from [facility] ADVANTAGE/RX ADVANCE regarding a prior authorization for [name]. Authorization has been APPROVED from 05/11/24 to 07/05/25. Approval letter sent to scan center.  Per test claim, copay for 30 days supply is $0  Patient can fill through [facility]: [phone]   Authorization # [id number] Phone # [phone]

## 2024-05-11 NOTE — Telephone Encounter (Signed)
 New start [name] counseling in separate encounter - see pharmacotherapy clinic note 05/11/24.

## 2024-05-11 NOTE — Telephone Encounter (Signed)
 Submitted a Prior Authorization request to [facility] ADVANTAGE/RX ADVANCE for [name] via CoverMyMeds. Will update once we receive a response.  Key: [id number]

## 2024-05-22 NOTE — Telephone Encounter (Signed)
 Patient left two voicemails - one stating he had not heard from [facility] regarding [name], the second voicemail stating he will be receiving [name] on 05/19/24 from [place]. He expressed confusion over whether the VM made it to me.   ATC patient to reassure him he had the correct number, but I was unable to LVM d/t VM full.   NFN.

## 2024-05-25 ENCOUNTER — Inpatient Hospital Stay: Admitting: Oncology

## 2024-05-25 ENCOUNTER — Inpatient Hospital Stay: Attending: Oncology

## 2024-05-25 VITALS — BP 120/74 | HR 62 | Temp 97.5°F | Resp 18 | Ht 69.0 in | Wt 163.6 lb

## 2024-05-25 DIAGNOSIS — K589 Irritable bowel syndrome without diarrhea: Secondary | ICD-10-CM | POA: Diagnosis not present

## 2024-05-25 DIAGNOSIS — C8291 Follicular lymphoma, unspecified, lymph nodes of head, face, and neck: Secondary | ICD-10-CM | POA: Diagnosis not present

## 2024-05-25 DIAGNOSIS — C8330 Diffuse large B-cell lymphoma, unspecified site: Secondary | ICD-10-CM

## 2024-05-25 DIAGNOSIS — J84112 Idiopathic pulmonary fibrosis: Secondary | ICD-10-CM | POA: Diagnosis not present

## 2024-05-25 DIAGNOSIS — Z79899 Other long term (current) drug therapy: Secondary | ICD-10-CM | POA: Insufficient documentation

## 2024-05-25 DIAGNOSIS — K5730 Diverticulosis of large intestine without perforation or abscess without bleeding: Secondary | ICD-10-CM | POA: Insufficient documentation

## 2024-05-25 DIAGNOSIS — Z9981 Dependence on supplemental oxygen: Secondary | ICD-10-CM | POA: Diagnosis not present

## 2024-05-25 DIAGNOSIS — D649 Anemia, unspecified: Secondary | ICD-10-CM | POA: Diagnosis not present

## 2024-05-25 DIAGNOSIS — K648 Other hemorrhoids: Secondary | ICD-10-CM | POA: Diagnosis not present

## 2024-05-25 DIAGNOSIS — C61 Malignant neoplasm of prostate: Secondary | ICD-10-CM | POA: Diagnosis not present

## 2024-05-25 LAB — CMP (CANCER CENTER ONLY)
ALT: 16 U/L (ref 0–44)
AST: 22 U/L (ref 15–41)
Albumin: 4.1 g/dL (ref 3.5–5.0)
Alkaline Phosphatase: 61 U/L (ref 38–126)
Anion gap: 7 (ref 5–15)
BUN: 11 mg/dL (ref 8–23)
CO2: 30 mmol/L (ref 22–32)
Calcium: 9.2 mg/dL (ref 8.9–10.3)
Chloride: 96 mmol/L — ABNORMAL LOW (ref 98–111)
Creatinine: 0.61 mg/dL (ref 0.61–1.24)
GFR, Estimated: >60 mL/min (ref >60)
Glucose, Bld: 147 mg/dL — ABNORMAL HIGH (ref 70–99)
Potassium: 4.4 mmol/L (ref 3.5–5.1)
Sodium: 133 mmol/L — ABNORMAL LOW (ref 135–145)
Total Bilirubin: 0.5 mg/dL (ref 0.0–1.2)
Total Protein: 7.1 g/dL (ref 6.5–8.1)

## 2024-05-25 LAB — LACTATE DEHYDROGENASE: LDH: 225 U/L (ref 105–235)

## 2024-05-25 LAB — CBC WITH DIFFERENTIAL (CANCER CENTER ONLY)
Abs Immature Granulocytes: 0.02 K/uL (ref 0.00–0.07)
Basophils Absolute: 0 K/uL (ref 0.0–0.1)
Basophils Relative: 0 %
Eosinophils Absolute: 0.2 K/uL (ref 0.0–0.5)
Eosinophils Relative: 4 %
HCT: 34.5 % — ABNORMAL LOW (ref 39.0–52.0)
Hemoglobin: 11.9 g/dL — ABNORMAL LOW (ref 13.0–17.0)
Immature Granulocytes: 0 %
Lymphocytes Relative: 17 %
Lymphs Abs: 0.8 K/uL (ref 0.7–4.0)
MCH: 31.5 pg (ref 26.0–34.0)
MCHC: 34.5 g/dL (ref 30.0–36.0)
MCV: 91.3 fL (ref 80.0–100.0)
Monocytes Absolute: 0.5 K/uL (ref 0.1–1.0)
Monocytes Relative: 11 %
Neutro Abs: 3.4 K/uL (ref 1.7–7.7)
Neutrophils Relative %: 68 %
Platelet Count: 178 K/uL (ref 150–400)
RBC: 3.78 MIL/uL — ABNORMAL LOW (ref 4.22–5.81)
RDW: 13.5 % (ref 11.5–15.5)
WBC Count: 5 K/uL (ref 4.0–10.5)
nRBC: 0 % (ref 0.0–0.2)

## 2024-05-25 MED ORDER — ONDANSETRON HCL 8 MG PO TABS: 8.0000 mg | ORAL_TABLET | Freq: Three times a day (TID) | ORAL | 2 refills | Status: AC | PRN

## 2024-05-25 NOTE — Progress Notes (Signed)
 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  No palpable lymph nodes.  No fever or night sweats.  His appetite remains poor.  He started a new medication for pulmonary fibrosis and hopes his nausea will improve.  He feels stronger after discontinuing antiandrogen therapy.  He continues to have back pain.  He is participating in pulmonary rehab.  He is on continuous oxygen .  Objective:  Vital signs in last 24 hours:  Blood pressure 120/74, pulse 62, temperature (!) 97.5 F (36.4 C), temperature source Temporal, resp. rate 18, height 5' 9 (1.753 m), weight 163 lb 9.6 oz (74.2 kg), SpO2 98%.    Lymphatics: No cervical, supraclavicular, axillary, or inguinal nodes Resp: Inspiratory rales at the posterior chest bilaterally, no respiratory distress Cardio: Regular rate and rhythm GI: No hepatosplenomegaly Vascular: No leg edema  Lab Results:  Lab Results  Component Value Date   WBC 5.0 05/25/2024   HGB 11.9 (L) 05/25/2024   HCT 34.5 (L) 05/25/2024   MCV 91.3 05/25/2024   PLT 178 05/25/2024   NEUTROABS 3.4 05/25/2024    CMP  Lab Results  Component Value Date   NA 133 (L) 11/22/2023   K 4.3 11/22/2023   CL 96 (L) 11/22/2023   CO2 29 11/22/2023   GLUCOSE 177 (H) 11/22/2023   BUN 10 11/22/2023   CREATININE 0.72 11/22/2023   CALCIUM  9.2 11/22/2023   PROT 6.6 11/22/2023   ALBUMIN  4.2 11/22/2023   AST 20 11/22/2023   ALT 12 11/22/2023   ALKPHOS 60 11/22/2023   BILITOT 0.4 11/22/2023   GFRNONAA >60 11/22/2023   GFRAA >60 06/01/2017     Medications: I have reviewed the patient's current medications.   Assessment/Plan: Non-Hodgkin's lymphoma CTs neck and chest 03/19/2021-cluster of left supraclavicular nodes measuring less than 1 cm, 12 mm right node next to the jugular vein, chest with slight interval progression of pulmonary fibrosis, new small sclerotic lesion in the right  third rib concerning for metastasis, lucent lesion in T8 corresponding to abnormal uptake on a bone scan 05/21/2020 suspicious for malignancy, subpectoral nodes-subcentimeter in size but larger than 03/28/2019 FNA of left neck node 03/25/2021-atypical lymphoid proliferation Excisional biopsy of level 5 left neck node 04/07/2021-lymph node with marked autolysis and diffusely infiltrated by atypical lymphocytes, 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 Cycle 1 R-mini CHOP 07/10/2021 Cycle 2 R-mini CHOP 07/31/2021 Cycle 3 R-mini CHOP 08/21/2021 Cycle 4 R-mini CHOP 09/11/2021 PET 09/29/2021-near complete resolution of hypermetabolic lymph nodes, no remaining hypermetabolic disease, wall thickening and hypermetabolic activity at the transverse and sigmoid colon Cycle 5 R-mini CHOP 10/02/2021 Cycle 6 R-mini CHOP 10/23/2021, Udenyca   10/24/2021 Prostate cancer-T3 adenocarcinoma, Gleason 5+4, external  beam XRT 09/12/2020 - 11/07/2020, maintained on androgen deprivation therapy 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  Anemia-chronic 7.  C4-5, C5-6, C6-7-anterior cervical discectomy and fusion 08/11/2022 8.  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  He has multiple comorbid conditions.  He is now maintained on oxygen  4 pulmonary fibrosis.  He will return for an office and lab visit in 6 months.  I am available to see him in the interim as needed.  Arley Hof, MD  05/25/2024  10:20 AM

## 2024-05-26 DIAGNOSIS — U071 COVID-19: Secondary | ICD-10-CM | POA: Diagnosis not present

## 2024-05-26 DIAGNOSIS — J84112 Idiopathic pulmonary fibrosis: Secondary | ICD-10-CM | POA: Diagnosis not present

## 2024-05-26 DIAGNOSIS — J9601 Acute respiratory failure with hypoxia: Secondary | ICD-10-CM | POA: Diagnosis not present

## 2024-06-12 ENCOUNTER — Telehealth (HOSPITAL_COMMUNITY): Payer: Self-pay | Admitting: Cardiology

## 2024-06-12 NOTE — Telephone Encounter (Signed)
 Called to confirm/remind patient of their appointment at the [facility] on 06/12/24.   Appointment:   [x] Confirmed  [] Left mess   [] No answer/No voice mail  [] VM Full/unable to leave message  [] Phone not in service  Patient reminded to bring all medications and/or complete list.  Confirmed patient has transportation. Gave directions, instructed to utilize valet parking.

## 2024-06-13 ENCOUNTER — Ambulatory Visit (HOSPITAL_COMMUNITY)
Admission: RE | Admit: 2024-06-13 | Discharge: 2024-06-13 | Disposition: A | Source: Ambulatory Visit | Attending: Cardiology | Admitting: Cardiology

## 2024-06-13 ENCOUNTER — Encounter (HOSPITAL_COMMUNITY): Payer: Self-pay | Admitting: Cardiology

## 2024-06-13 VITALS — BP 110/60 | HR 62 | Wt 164.8 lb

## 2024-06-13 DIAGNOSIS — I5022 Chronic systolic (congestive) heart failure: Secondary | ICD-10-CM

## 2024-06-13 DIAGNOSIS — J84112 Idiopathic pulmonary fibrosis: Secondary | ICD-10-CM

## 2024-06-13 NOTE — Patient Instructions (Signed)
 There has been no changes to your medications.  Your physician recommends that you schedule a follow-up appointment in: 6 months ( June 2026) ** PLEASE CALL THE OFFICE IN APRIL TO ARRANGE YOUR FOLLOW UP APPOINTMENT.**  If you have any questions or concerns before your next appointment please send us  a message through [place] or call our office at [phone].    TO LEAVE A MESSAGE FOR THE NURSE SELECT OPTION 2, PLEASE LEAVE A MESSAGE INCLUDING: YOUR NAME DATE OF BIRTH CALL BACK NUMBER REASON FOR CALL**this is important as we prioritize the call backs  YOU WILL RECEIVE A CALL BACK THE SAME DAY AS LONG AS YOU CALL BEFORE 4:00 PM  At the [facility], you and your health needs are our priority. As part of our continuing mission to provide you with exceptional heart care, we have created designated Provider Care Teams. These Care Teams include your primary Cardiologist (physician) and Advanced Practice Providers (APPs- Physician Assistants and Nurse Practitioners) who all work together to provide you with the care you need, when you need it.   You may see any of the following providers on your designated Care Team at your next follow up: Dr Toribio Fuel Dr Ezra Shuck Dr. Morene Brownie Greig Mosses, NP Caffie Shed, [place] [facility], [place] Beckey Coe, NP Jordan Lee, NP Ellouise Class, NP Tinnie Redman, PharmD Jaun Bash, PharmD   Please be sure to bring in all your medications bottles to every appointment.    Thank you for choosing [facility]

## 2024-06-13 NOTE — Progress Notes (Signed)
 PCP: Shayne Anes, MD Pulmonology: Dr. Geronimo Cardiology: Dr. Rolan  Chief complaint: Shortness of breath  83 y.o. with history of pulmonary fibrosis, high grade B cell lymphoma, and prostate cancer was referred by Dr. Geronimo for evaluation of dyspnea and tachycardia.  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  He is on Esbriet .  He has not used home oxygen .  He has baseline exertional dyspnea.  In 5/22, he had XRT for prostate cancer.  In 9/22, he was diagnosed with high grade B cell lymphoma and finished his 6th cycle of mini-CHOP in 4/23.  Pre-op echo in 9/22 showed EF 65-70%, normal RV, unable to estimate PASP, mild AS mean gradient 12 mmHg.  Patient has been noted by CT to have coronary calcification.   After completing CHOP in 4/23, patient developed persistent sinus tachycardia.  He felt palpitations.  D dimer was normal.  No evidence for infection.  Echo in 4/23 showed EF 55-60%, normal RV, unable to assess PA pressure (no TR jet), and nodular calcification of the mitral valve.  Due to mitral valve abnormality, blood cultures were done which were negative.  Sinus tachycardia eventually subsided on its own.   There was talk over doing CTA to further investigate etiology of dyspnea. Felt arranging LHC at time of RHC would make most sense.   Underwent [facility] 10/23 showing mild LAD lesion 40% stenosed, normal filling pressures, normal PA pressure, normal CO.  S/p cervical spinal decompression & fusion 08/11/22. Seen in ED 08/23/22 with palpitations. 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 Zio monitor in 3/24 showed 16% PVCs.  Toprol  XL increased to 25 mg bid.    Echo 3/25 showed EF 60-65%, normal RV, aortic sclerosis without stenosis, ascending aorta 4.0 cm. Patient had a COVID 19 episode in 3/25 requiring hospitalization.   Zio monitor in 5/25 showed 5.4% PVCs, 4 short NSVT runs.   Toprol  XL increased.   Pirfenidone  has been stopped and nerandolimast started for pulmonary fibrosis.   Patient presents for cardiology followup.  Not using oxygen .  Generally stable symptomatically.  Doing pulmonary rehab.  Dyspnea with walking up stair or heavier exertion.  Generally does ok walking on flat ground.  He had an episode last week of substernal chest discomfort after a meal, he suspects not angina but more likely GERD.  He does not get exertional chest pain. No palpitations or lightheadedness.   ECG (personally reviewed): NSR with PVC  Labs (2/24): TSH and LFTs normal, K 4.0, creatinine 0.59, hgb 11.3, HS-Tnl 7>>7. Labs (4/24): K 3.9, creatinine 0.66 Labs (5/24): LDL 68 Labs (11/24): K 4, creatinine 0.66 Labs (3/25): K 4.5, creatinine 0.5, hgb 12.7, TSH normal Labs (4/25): TnI negative, pro-BNP normal, K 4.4, creatinine 0.68 Labs (5/25): K 4.3, creatinine 0.73 Labs (6/25): LDL 66 Labs (11/25): K 4.4, creatinine 0.6  PMH: 1. IBS 2. 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  - CT chest in 9/22 with pulmonary fibrosis, enlarged PA, coronary artery calcium .  3. CAD: Coronary calcification on 9/22 CT chest.  - Cardiolite in 9/18 showed no ischemia or infarction.  - LHC (10/23): mild nonobstructive CAD with 40% mid LAD lesion 4. Hyperlipidemia 5. High grade B cell lymphoma: Diagnosed in 9/22.  He received 6 cycles of mini-CHOP.  - Echo (9/22): Prechemo, EF 65-70%, normal RV, unable to estimate PASP, mild AS mean gradient 12 mmHg.  - Echo (4/23): EF 55-60%, normal  RV, unable to assess PA pressure (no TR jet), and nodular calcification of the mitral valve.  - RHC (10/23): RA 2, PA 24/5 (mean 14), PCWP 5, CO/CI 6.26/3.29 - Echo (3/24): EF 55-60%, normal RV, mild-moderate AS with mean gradient 11 and AVA 1.52 cm^2.  - Echo (3/25): EF 60-65%, normal RV, aortic sclerosis without stenosis, ascending aorta 4.0 cm.  6. Aortic stenosis: Mild-moderate on 3/24 echo but minimal on  3/25 echo.  7. 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  8. H/o L-spine compression fracture.  9. Sleep study 5/24 with mild OSA.  10. PVCs: Zio monitor (3/24) with 16% PVCs - Zio monitor (5/25): 5.4% PVCs, 4 short NSVT runs.  SH: Married, lives in [place], retired marine scientist, nonsmoker, rare ETOH.   Family History  Problem Relation Age of Onset   Heart failure Father 43   Bladder Cancer Mother    Breast cancer Mother    Colon cancer Neg Hx    Pancreatic cancer Neg Hx    Rectal cancer Neg Hx    Stomach cancer Neg Hx    Prostate cancer Neg Hx    ROS: All systems reviewed and negative except as per HPI.   Current Outpatient Medications  Medication Sig Dispense Refill   acetaminophen  (TYLENOL ) 500 MG tablet Take 1,000 mg by mouth every 6 (six) hours as needed for moderate pain.     acetaminophen -codeine  (TYLENOL  #3) 300-30 MG tablet Take 1 tablet by mouth every 4 (four) hours as needed (for pain).     albuterol  (VENTOLIN  HFA) 108 (90 Base) MCG/ACT inhaler Inhale 2 puffs into the lungs every 6 (six) hours as needed for wheezing or shortness of breath. 6.7 g 2   aspirin  81 MG chewable tablet Chew 81 mg by mouth 3 (three) times a week.     carboxymethylcellulose (REFRESH TEARS) 0.5 % SOLN Place 1 drop into both eyes daily as needed (dry eyes).     carisoprodol  (SOMA ) 250 MG tablet Take 175-350 mg by mouth 3 (three) times daily as needed (for muscle spasms).     Cholecalciferol  (VITAMIN D -3) 1000 UNITS CAPS Take 2,000 Units by mouth every evening.     CO-ENZYME Q-10 PO Take 1 tablet by mouth every evening.      Cyanocobalamin  (VITAMIN B12) 1000 MCG TBCR Take 1,000 mcg by mouth daily.     denosumab  (PROLIA ) 60 MG/ML SOSY injection Inject 60 mg into the skin every 6 (six) months. At [facility]     desvenlafaxine (PRISTIQ) 50 MG 24 hr tablet Take 50 mg by mouth daily.     diclofenac  (VOLTAREN ) 75 MG EC tablet Take 75 mg by mouth 2 (two) times daily as needed (pain.).  2   diclofenac   sodium (VOLTAREN ) 1 % GEL Apply 4 g topically 4 (four) times daily as needed (pain.).  12   dicyclomine  (BENTYL ) 10 MG capsule Take one by mouth every 6 hours as needed for abdominal cramping 30 capsule 1   diphenoxylate-atropine (LOMOTIL) 2.5-0.025 MG tablet Take 1 tablet by mouth as needed for diarrhea or loose stools.     Emollient (AQUAPHOR ADV PROTECT HEALING EX) Apply 1 Application topically daily as needed (itching).     Evolocumab (REPATHA) 140 MG/ML SOSY Inject 140 mg into the skin every 30 (thirty) days.     Glucosamine Sulfate 500 MG TABS Take 500 mg by mouth daily.     HYDROcodone -acetaminophen  (NORCO) 10-325 MG tablet Take 1 tablet by mouth See admin instructions. Take 1 tablet by  mouth every 4-6 hours as needed for pain     latanoprost  (XALATAN ) 0.005 % ophthalmic solution Place 1 drop into both eyes at bedtime.     loratadine  (CLARITIN ) 10 MG tablet Take 10 mg by mouth daily.     meclizine  (ANTIVERT ) 25 MG tablet Take 25 mg by mouth 3 (three) times daily as needed for dizziness.     metoprolol  succinate (TOPROL -XL) 50 MG 24 hr tablet Take 1 tablet (50 mg total) by mouth in the morning and at bedtime. Take with or immediately following a meal. 60 tablet 11   Multiple Vitamin (MULTIVITAMIN) tablet Take 1 tablet by mouth daily with breakfast.     nerandomilast ([name]) 18 MG tablet Take 18 mg by mouth every 12 (twelve) hours. 60 tablet 1   omeprazole  (PRILOSEC) 40 MG capsule Take 1 capsule (40 mg total) by mouth daily. Call our office at [phone] to schedule an appointment for additional refills. 30 capsule 0   ondansetron  (ZOFRAN ) 8 MG tablet Take 1 tablet (8 mg total) by mouth every 8 (eight) hours as needed for nausea or vomiting. 30 tablet 2   Probiotic Product (ALIGN) 4 MG CAPS Take 4 mg by mouth daily.     SODIUM FLUORIDE 5000 PLUS 1.1 % CREA dental cream Place 1 Application onto teeth at bedtime.     XHANCE  93 MCG/ACT EXHU Place 1 spray into both nostrils 2 (two) times daily  as needed (for sinus issues).     No current facility-administered medications for this encounter.   Wt Readings from Last 3 Encounters:  06/13/24 74.8 kg (164 lb 12.8 oz)  05/25/24 74.2 kg (163 lb 9.6 oz)  05/10/24 75 kg (165 lb 6.4 oz)   BP 110/60   Pulse 62   Wt 74.8 kg (164 lb 12.8 oz)   SpO2 94%   BMI 24.34 kg/m  General: NAD Neck: No JVD, no thyromegaly or thyroid  nodule.  Lungs: Dry crackles at bases.  CV: Nondisplaced PMI.  Heart regular S1/S2, no S3/S4, 2/6 SEM RUSB with clear S2.  No peripheral edema.  No carotid bruit.  Normal pedal pulses.  Abdomen: Soft, nontender, no hepatosplenomegaly, no distention.  Skin: Intact without lesions or rashes.  Neurologic: Alert and oriented x 3.  Psych: Normal affect. Extremities: No clubbing or cyanosis.  HEENT: Normal.   Assessment/Plan: 1. PVCs/PACs: Zio monitor in 3/24 with 16% PVCs.  Toprol  XL was increased.  Echo in 3/24 and 3/25 showed normal EF.  Cath in 10/23 showed nonobstructive CAD.  Most recent Zio monitor in 5/25 showed less PVCs, down to 5.4%. No lightheadedness or syncope.  - Continue Toprol  XL 50 mg bid.  - Mild OSA on sleep study, he does not want to use CPAP.  2. Exertional dyspnea: Suspect this is primarily due to IPF.  Echo in 3/24 showed EF 55-60%, normal RV, mild-moderate AS with mean gradient 11 and AVA 1.52 cm^2.  RHC (10/23) with normal filling pressures, normal PA pressure and normal CO. No indication for Tyvaso based on RHC. Echo in 3/25 showed EF 60-65%, normal RV, aortic sclerosis without stenosis, ascending aorta 4.0 cm. He is not volume overloaded on exam.    - Suspect progressive IPF is the main contributor to dyspnea.  4. IPF: Long-standing, currently on nerandolimast.  5. CAD: Coronary calcification noted. LHC (10/23) with mild, non-obstructive CAD; mild LAD 40% stenosed. He had 1 episode of atypical chest pain last week, no chest pain with exertion such as exercising, walking.  -  Continue ASA 81.  -  Continue Repatha, lipids ok in 6/25.  6. Aortic stenosis: Mild to moderate on 3/24 echo.  However, minimal on 3/25 echo.  Murmur today not suggestive of significant AS.   Follow up 6 months   I spent 22 minutes reviewing records, interviewing/examining patient, and managing orders.    Ezra Shuck  06/13/2024

## 2024-06-14 ENCOUNTER — Encounter (HOSPITAL_COMMUNITY): Admitting: Cardiology

## 2024-07-08 ENCOUNTER — Other Ambulatory Visit: Payer: Self-pay | Admitting: Internal Medicine

## 2024-07-08 DIAGNOSIS — J84112 Idiopathic pulmonary fibrosis: Secondary | ICD-10-CM

## 2024-07-12 NOTE — Progress Notes (Signed)
 "     OV 12/17/2023  Subjective:  Patient ID: Kenneth [name], male , DOB: 07-Nov-1940 , age 84 y.o. , MRN: [id number] , ADDRESS: [address] [name] [id number] PCP Kenneth Anes, MD Patient Care Team: Kenneth Anes, MD as PCP - General (Internal Medicine) Kenneth Buddle, RN as Oncology Nurse Navigator O'Kelley, Sari SQUIBB, RN as Oncology Nurse Navigator  This Provider for this visit: Treatment Team:  Attending Provider: Geronimo Amel, MD     12/17/2023 -   Chief Complaint  Patient presents with   Follow-up     HPI Kenneth [name] 84 y.o. -returns for follow-up.  He is now attending pulmonary rehabilitation which he states helps him.  The using 4 L of nasal cannula oxygen .  His weight is stable.  [name] and he fell down and took a bruise to his left lower back but is otherwise doing well.  His main issue is that he did this to pirfenidone .  While he is aware of this he is somewhat reluctant to stop it.  He is also dealing with alternating constipation and diarrhea partly because of Zofran  for nausea and also the opioids he takes.  Then he has to follow this up with laxatives.  I advised 1 week of rest from pirfenidone  but he is somewhat reluctant but he agreed.  He is seeing other doctors for his fatigue but I did tell him to do a drug holiday as a test to see if fatigue is related to pirfenidone .  He did have his pulmonary function test this result was not available at the time of the visit but clinically appears stable from the previous visit [see symptom scores below] but overall he is dealing with progressive disease.  Later got the pulmonary function test and definitely his decline compared to November 2024.  In the interim.  He did have his COVID.    Last Weight  Most recent update: 12/17/2023 11:40 AM    Weight  74 kg (163 lb 3.2 oz)               OV 05/10/2024  Subjective:  Patient ID: Kenneth [name], male , DOB: Oct 14, 1940 , age  84 y.o. , MRN: [id number] , ADDRESS: [address] [place] [address] PCP Kenneth Anes, MD Patient Care Team: Kenneth Anes, MD as PCP - General (Internal Medicine) Kenneth Buddle, RN as Oncology Nurse Navigator O'Kelley, Sari SQUIBB, RN as Oncology Nurse Navigator  This Provider for this visit: Treatment Team:  Attending Provider: Geronimo Amel, MD    Follow-up idiopathic pulmonary fibrosis.  Diagnosis towards the end of 2018.  On pirfenidone .  Last CT scan of the chest September 2020. 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  Depression and started on Pristiq by primary care physician in 2023  Esbriet /Pirfenidone  requires intensive drug monitoring due to high concerns for Adverse effects of , including  Drug Induced Liver Injury, significant GI side effects that include but not limited to Diarrhea, Nausea, Vomiting,  and other system side effects that include Fatigue, headaches, weight loss and other side effects such as skin rash. These will  be monitored with  blood work such as LFT initially once a month for 6 months and then quarterly   05/10/2024 -   Chief Complaint  Patient presents with   Medical Management of Chronic Issues   Interstitial Lung Disease    Breathing has been stable overall and he denies any new concerns.      HPI Kenneth [name] 84 y.o. -Kenneth [name] is an 84 year old male with pulmonary fibrosis who presents for follow-up regarding his medication regimen and pulmonary rehabilitation.    He is currently taking Esbriet  and experiences side effects such as nausea and diarrhea. He manages these with Imodium and nausea medication. He recently visited [facility] for pulmonary function tests and a six-minute walk test. He attends pulmonary rehabilitation twice a week, which he finds beneficial for his leg strength.  In fact this is really helped his exercise test today.  He speed is much better.  He is very pleased about this.  He saw Kenneth [name] Kenneth. Mardy [name] .  Here pulmonary function test that I reviewed Medical record is stable.  He also had oxygen  titration test.  It appears that he stable.  Because of his issues with pirfenidone  tolerance they discussed Nerandomilast  versus nintedanib.  Per his history Kenneth [name] was leaning towards nintedanib.  I did educate him about Nerandomilast  and the diarrhea side effects.  Told him that it is noninferior to pirfenidone  and the side effect profile overall statistically is better and given his ongoing issues with GI side effects with pirfenidone  that I would recommend it as a substitute.  And then based on progression we could add pirfenidone  again or nintedanib at that point.  He is open to this idea.  Insurance approval might be a problem but we decided we would come into the pathway and try.  He is interested in clinical trials.  I talked about IV infusion study but will opt to see whether he gets approved for Nerandomilast  or not because that could be an explosion.  He  is up-to-date with his flu shot    He mentions a past medical history of prostate cancer and lymphoma, and he has been managing back pain with injections. He has also been involved in pulmonary rehabilitation, which he finds helpful for his overall strength and mobility.     OV 07/13/2024  Subjective:  Patient ID: Kenneth [name], male , DOB: [birth year] , age 53 y.o. , MRN: [id number] , ADDRESS: [address] [place] [facility] [address] PCP Kenneth Anes, MD Patient Care Team: Kenneth Anes, MD as PCP - General (Internal Medicine) Kenneth Buddle, RN as Oncology Nurse Navigator O'Kelley, Sari SQUIBB, RN as Oncology Nurse Navigator  This Provider for this visit: Treatment Team:  Attending Provider: Geronimo Amel, MD  07/13/2024 -   Chief Complaint  Patient presents with   Medical Management of Chronic Issues   Interstitial Lung Disease    Started [name]  05/19/24- breathing has been worse since then.      HPI COLTEN DESROCHES 84 y.o. -NAVJOT LOERA is an 84 year old male with IPF pulmonary fibrosis who presents with worsening breathing difficulties.  At last visit we stopped his pirfenidone  because of significant GI side effects and started him on the newly approved Nerandomilast .  Which she started approximately May 10, 2024.  He stopped his pirfenidone  at this time.  He says that with Nerandomilast  his GI side effect profile is much better.  He essentially has no GI side effects other than his baseline irritable bowel syndrome.  All the GI side effects of pirfenidone  are gone.  However, he feels the shortness of breath is worse.  Symptom score is 19.  This seems to range bound though. His exercise hypoxemic test shows improving endurance though wants to desaturate   He feels that his there is increased oxygen  use, particularly when moving around his house or getting dressed. He attends pulmonary rehabilitation at [facility] two to three times a week, where he exercises with oxygen  set at  four liters per minute. At home, he uses a concentrator set at two to three liters per minute, occasionally increasing to four liters if needed.  At rehab he uses 4 L.  He takes 30 minutes to finish shower and change clothes.  He also believes that his worsening shortness of breath might be related to anxiety.  On the other hand he also feels it might be related to PVC He continues to experience premature ventricular contractions (PVCs) and is taking Toprol  50 mg twice daily.   He maintains his weight with the help of Ensure nutritional drinks. His last CT scan was in October 2024.  And his last echocardiogram was in March 2025. He underwent a right heart catheterization in October 2023. He mentions that his exercise tolerance has decreased slightly, noting that during his last visit, he could perform fifteen sit-to-stand exercises without his oxygen  saturation dropping below 90%.     SYMPTOM SCALE - ILD 08/23/2018  10/22/2020 170# - esbriet  and XRT for prostate 04/03/2021 168# 06/17/2021 168# lymphoma 10/13/2022 164# esbiret 01/04/2023 164# Esbret full dose 05/13/2023 Esbriet  + Teton 301 study 167# 09/16/2023 167# Covid admit feb 2025 11/04/2023 166# 12/17/2023 163# esbriet  full does 05/10/2024 Esbiret full dose 165# 07/13/2024 Off esbiret  On Jascaydi since 05/10/25  O2 use ra  ra ra ra ra ra  ra 4L wth exertion O2 with eertion no change 2-3L at home with ex, 4 at rehab  Shortness of Breath 0 -> 5 scale with 5 being worst (score 6 If unable to do)             At rest 0 0 0 0 0 0 0 0 1 0  0 0  Simple tasks - showers, clothes change, eating, shaving *0** 0 0 0 1 1 1 2 4 1 2  3  Household (dishes, doing bed, laundry) 0 0 1 1 2 3 2 2 5 2  3 4  Shopping 0 0 0 1 2 3 2 3 4 3  3 4  Walking level at own pace 0 0 0 1 2 1 1 3 2 3  3 3  Walking up Stairs 2 2 2 3 4 4 3 5 5 5 5 5   Total (40 - 48) Dyspnea Score 2  2 3 6 11 12 9 15 21 14 16 19   How bad is your cough? 0 0 0 1 0 0 0 0 0 0  0   How bad is your fatigue  1 due to esbriet  2 1 3.5 2.5 3 3 3  3.5 3 2.5   nausea  1 1 3 2 3 2 1 2 3 3  esbrit   vomit  0 0 0 0 0 0 0 1 2 0    diarrhea  3 1 2 2 1  0 1 1 3 1    anxiety  0 1 1 1 2 1 1 1 1 1    depresso0  0 1 1 1 2 1 0 0 1 1            SIT STAND TEST - goal 15 times   11/04/2023  05/10/2024  07/13/2024   O2 used Room air 5-6 min Room air ra  PRobe - finter or forehead forehead finger Finger   Number sit and stand completed - goal 15 15 15 15   Time taken to complete 1 min 37 sec 33 sec  Resting Pulse Ox/HR/Dyspnea  97% and 83/min and dyspnea of 3/10  94% and HR 92 and score 3 97% and HR 81 and score 2  Peak measures 9 % and 96/min and dyspnea of 8/10 90% and HR 98 and score 7 95% and HR 90 and socre 6  Final Pulse Ox/HR 94% and 93/min and dyspnea of 6/10 90% and HR 87 and score 3 90% and HR 87 and score 4  Desaturated </= 88% no yes yes  Desaturated <= 3% points yes yes yes  Got Tachycardic >/= 90/min yes yes yes  Miscellaneous comments 2 points 3 ponts but did it faster Faster and less dyspenic     PFT     Latest Ref Rng & Units 12/17/2023    8:31 AM 05/13/2023   12:56 PM 01/04/2023    8:51 AM 10/09/2022    4:21 PM 04/07/2022    1:46 PM 01/09/2022    9:17 AM 09/18/2021    8:52 AM  PFT Results  FVC-Pre L 1.70  2.37  2.27  2.33  2.53  2.49  2.68   FVC-Predicted Pre % 46  64  62  63  68  64  68   Pre FEV1/FVC % % 80  86  84  86  86  87  84   FEV1-Pre L 1.36  2.03  1.92  2.01  2.18  2.16  2.25   FEV1-Predicted Pre % 53  78  74  77  83  78  80   DLCO uncorrected ml/min/mmHg 10.56  11.71  10.66  11.48  12.36  12.55  13.07   DLCO UNC% % 46  51  46  50  53  53  54   DLCO corrected ml/min/mmHg 11.41  11.71  10.66  12.81  13.57  12.55  14.64   DLCO COR %Predicted % 50  51  46  55  59  53  61   DLVA Predicted % 100  81  77  94  89  91  105        LAB RESULTS last 96 hours No results found.       has a past medical history of Arthritis, Depression, Diffuse large B cell lymphoma (HCC) (2023),  Diverticulosis of colon (without mention of hemorrhage), GERD (gastroesophageal reflux disease), Hiatal hernia, HLD (hyperlipidemia), Internal hemorrhoids without mention of complication, Irritable  bowel syndrome, Other specified gastritis without mention of hemorrhage, Prostate cancer (HCC) (2022), Pulmonary fibrosis (HCC), and Stress fracture (02/2021).   reports that he has never smoked. He has never used smokeless tobacco.  Past Surgical History:  Procedure Laterality Date   ANTERIOR CERVICAL DECOMP/DISCECTOMY FUSION N/A 08/11/2022   Procedure: Anterior Cervical Decompression/Discectomy Fusion - Cervical Four-Cervical Five -  Cervical Five-Cervical Six - Cerivcal Six-Cerival Seven;  Surgeon: Louis Shove, MD;  Location: [facility] OR;  Service: Neurosurgery;  Laterality: N/A;   CARPAL TUNNEL RELEASE     left   CATARACT EXTRACTION  06/2011   bilateral   DEEP NECK LYMPH NODE BIOPSY / EXCISION  03/25/2021   INGUINAL HERNIA REPAIR Right 09/05/2019   Procedure: OPEN REPAIR RIGHT INGUINAL HERNIA WITH MESH;  Surgeon: Eletha Boas, MD;  Location: [facility] ORS;  Service: General;  Laterality: Right;   LUMBAR DISC SURGERY  1986, 1995   x 2   LUMBAR LAMINECTOMY/DECOMPRESSION MICRODISCECTOMY Left 07/11/2013   Procedure: LUMBAR ONE TO TWO, LUMBAR TWO TO THREE, LUMBAR THREE TO FOUR LUMBAR LAMINECTOMY/DECOMPRESSION MICRODISCECTOMY 3 LEVELS;  Surgeon: Shove DELENA Louis, MD;  Location: [facility] NEURO ORS;  Service: Neurosurgery;  Laterality: Left;   NASAL SINUS SURGERY     RIGHT/LEFT HEART CATH AND CORONARY ANGIOGRAPHY N/A 04/14/2022   Procedure: RIGHT/LEFT HEART CATH AND CORONARY ANGIOGRAPHY;  Surgeon: Rolan Ezra RAMAN, MD;  Location: [name];  Service: Cardiovascular;  Laterality: N/A;    Allergies[1]  Immunization History  Administered Date(s) Administered   Fluad Quad(high Dose 65+) 03/18/2020   Fluzone Influenza virus vaccine,trivalent (IIV3), split virus 03/28/2010, 04/14/2011, 03/22/2012, 03/09/2013    INFLUENZA, HIGH DOSE SEASONAL PF 03/19/2016, 03/06/2017, 04/09/2018, 03/07/2019, 04/06/2020, 04/11/2022   Influenza Split 10/03/2009, 03/28/2014   Influenza, Quadrivalent, Recombinant, Inj, Pf 03/12/2018, 03/10/2019, 04/06/2020, 04/07/2023, 04/08/2024   Influenza,inj,Quad PF,6+ Mos 04/04/2014, 03/26/2015, 04/05/2016   Influenza-Unspecified 04/05/2016, 04/05/2021, 04/25/2023   Moderna Sars-Covid-2 Vaccination 04/28/2024   PFIZER Comirnaty(Gray Top)Covid-19 Tri-Sucrose Vaccine 10/15/2020   PFIZER(Purple Top)SARS-COV-2 Vaccination 07/20/2019, 08/10/2019, 02/27/2020, 03/23/2021   PNEUMOCOCCAL CONJUGATE-20 02/18/2021   Pfizer Covid-19 Vaccine Bivalent Booster 5y-11y 10/05/2022   Pneumococcal Conjugate-13 03/30/2013, 07/31/2013, 04/26/2018   Pneumococcal Polysaccharide-23 10/03/2009, 01/14/2010, 03/31/2018, 02/18/2021   Pneumococcal-Unspecified 04/05/2016   Td 10/20/2016   Tdap 10/03/2009   Unspecified SARS-COV-2 Vaccination 03/21/2023   Zoster Recombinant(Shingrix) 01/03/2017   Zoster, Live 10/03/2009    Family History  Problem Relation Age of Onset   Heart failure Father 95   Bladder Cancer Mother    Breast cancer Mother    Colon cancer Neg Hx    Pancreatic cancer Neg Hx    Rectal cancer Neg Hx    Stomach cancer Neg Hx    Prostate cancer Neg Hx     Current Medications[2]      Objective:   Vitals:   07/13/24 0830  BP: 122/64  Pulse: 83  SpO2: 94%  Weight: 162 lb (73.5 kg)  Height: 5' 9 (1.753 m)    Estimated body mass index is 23.92 kg/m as calculated from the following:   Height as of this encounter: 5' 9 (1.753 m).   Weight as of this encounter: 162 lb (73.5 kg).  @WEIGHTCHANGE @  [employer]   07/13/24 0830  Weight: 162 lb (73.5 kg)     Physical Exam   General: No distress. Looks well O2 at rest: no Cane present: no Sitting in wheel chair: no Frail: no Obese: o Neuro: Alert and Oriented x 3. GCS 15. Speech normal Psych: Pleasant  Resp:  Barrel  Chest - no.  Wheeze - no, Crackles - YES BASE, No overt respiratory distress CVS: Normal heart sounds. Murmurs - no Ext: Stigmata of Connective Tissue Disease - no HEENT: Normal upper airway. PEERL +. No post nasal drip        Assessment/     Assessment & Plan 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HCC)  Encounter for therapeutic drug monitoring    PLAN Patient Instructions     ICD-10-CM   1. 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HCC)  J84.112     2. Encounter for therapeutic drug monitoring  Z51.81        IPF: Might be progressive. Need to rule out pulmonary hypertension Glad you are tolerating [name]  well and you like to add Pirfenidone  back on now  PLAN -Continue Nerandomilast   -Restart pirfenidone   - 1 pill 801mg  once daily x 1 week -> 2 pills twice daily x 1 week -> 3 pills twice dailu  - if having side effects back down to 2 pills twice daily - Watch for increased side effect - get HRCT next few weeks - get ECHO next few weeks -Continue pulm rehabilitation - Oxygen  with exertion - PFT at [facility] per your choice - For anxiety consider meditation via apps such [facility] or Jodie [facility]  Follow-up - 3 months 30-minute visit      FOLLOWUP    Return for - 3 months 30-minute visit.    SIGNATURE    Kenneth. Dorethia Cave, M.D., F.C.C.P,  Pulmonary and Critical Care Medicine Staff Physician, [name] Director - Interstitial Lung Disease  Program  Pulmonary Fibrosis [name] [place], [place], [address]  Pager: [phone], If no answer or between  15:00h - 7:00h: call [phone] Telephone: [phone]  9:03 AM 07/13/2024     [1] No Known Allergies [2]  Current Outpatient Medications:    acetaminophen  (TYLENOL ) 500 MG tablet, Take 1,000 mg by mouth every 6 (six) hours as needed for moderate pain., Disp: , Rfl:    acetaminophen -codeine  (TYLENOL  #3) 300-30 MG tablet, Take 1 tablet  by mouth every 4 (four) hours as needed (for pain)., Disp: , Rfl:    albuterol  (VENTOLIN  HFA) 108 (90 Base) MCG/ACT inhaler, Inhale 2 puffs into the lungs every 6 (six) hours as needed for wheezing or shortness of breath., Disp: 6.7 g, Rfl: 2   aspirin  81 MG chewable tablet, Chew 81 mg by mouth 3 (three) times a week., Disp: , Rfl:    carboxymethylcellulose (REFRESH TEARS) 0.5 % SOLN, Place 1 drop into both eyes daily as needed (dry eyes)., Disp: , Rfl:    carisoprodol  (SOMA ) 250 MG tablet, Take 175-350 mg by mouth 3 (three) times daily as needed (for muscle spasms)., Disp: , Rfl:    Cholecalciferol  (VITAMIN D -3) 1000 UNITS CAPS, Take 2,000 Units by mouth every evening., Disp: , Rfl:    CO-ENZYME Q-10 PO, Take 1 tablet by mouth every evening. , Disp: , Rfl:    Cyanocobalamin  (VITAMIN B12) 1000 MCG TBCR, Take 1,000 mcg by mouth daily., Disp: , Rfl:    denosumab  (PROLIA ) 60 MG/ML SOSY injection, Inject 60 mg into the skin every 6 (six) months. At [name], Disp: , Rfl:    desvenlafaxine (PRISTIQ) 50 MG 24 hr tablet, Take 50 mg by mouth daily., Disp: , Rfl:    diclofenac  (VOLTAREN ) 75 MG EC tablet, Take 75 mg by mouth 2 (two)  times daily as needed (pain.)., Disp: , Rfl: 2   diclofenac  sodium (VOLTAREN ) 1 % GEL, Apply 4 g topically 4 (four) times daily as needed (pain.)., Disp: , Rfl: 12   dicyclomine  (BENTYL ) 10 MG capsule, Take one by mouth every 6 hours as needed for abdominal cramping, Disp: 30 capsule, Rfl: 1   diphenoxylate-atropine (LOMOTIL) 2.5-0.025 MG tablet, Take 1 tablet by mouth as needed for diarrhea or loose stools., Disp: , Rfl:    Emollient (AQUAPHOR ADV PROTECT HEALING EX), Apply 1 Application topically daily as needed (itching)., Disp: , Rfl:    Evolocumab (REPATHA) 140 MG/ML SOSY, Inject 140 mg into the skin every 30 (thirty) days., Disp: , Rfl:    Glucosamine Sulfate 500 MG TABS, Take 500 mg by mouth daily., Disp: , Rfl:    HYDROcodone -acetaminophen  (NORCO) 10-325 MG  tablet, Take 1 tablet by mouth See admin instructions. Take 1 tablet by mouth every 4-6 hours as needed for pain, Disp: , Rfl:    latanoprost  (XALATAN ) 0.005 % ophthalmic solution, Place 1 drop into both eyes at bedtime., Disp: , Rfl:    loratadine  (CLARITIN ) 10 MG tablet, Take 10 mg by mouth daily., Disp: , Rfl:    meclizine  (ANTIVERT ) 25 MG tablet, Take 25 mg by mouth 3 (three) times daily as needed for dizziness., Disp: , Rfl:    metoprolol  succinate (TOPROL -XL) 50 MG 24 hr tablet, Take 1 tablet (50 mg total) by mouth in the morning and at bedtime. Take with or immediately following a meal., Disp: 60 tablet, Rfl: 11   Multiple Vitamin (MULTIVITAMIN) tablet, Take 1 tablet by mouth daily with breakfast., Disp: , Rfl:    nerandomilast  ([name] ) 18 MG tablet, TAKE 1 TABLET BY MOUTH EVERY 12  HOURS, Disp: 60 tablet, Rfl: 5   omeprazole  (PRILOSEC) 40 MG capsule, Take 1 capsule (40 mg total) by mouth daily. Call our office at [phone] to schedule an appointment for additional refills., Disp: 30 capsule, Rfl: 0   ondansetron  (ZOFRAN ) 8 MG tablet, Take 1 tablet (8 mg total) by mouth every 8 (eight) hours as needed for nausea or vomiting., Disp: 30 tablet, Rfl: 2   Probiotic Product (ALIGN) 4 MG CAPS, Take 4 mg by mouth daily., Disp: , Rfl:    SODIUM FLUORIDE 5000 PLUS 1.1 % CREA dental cream, Place 1 Application onto teeth at bedtime., Disp: , Rfl:    XHANCE  93 MCG/ACT EXHU, Place 1 spray into both nostrils 2 (two) times daily as needed (for sinus issues)., Disp: , Rfl:   "

## 2024-07-12 NOTE — Patient Instructions (Addendum)
"    ICD-10-CM   1. 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HCC)  J84.112     2. Encounter for therapeutic drug monitoring  Z51.81        IPF: Might be progressive. Need to rule out pulmonary hypertension Glad you are tolerating [name]  well and you like to add Pirfenidone  back on now  PLAN -Continue Nerandomilast   -Restart pirfenidone   - 1 pill 801mg  once daily x 1 week -> 2 pills twice daily x 1 week -> 3 pills twice dailu  - if having side effects back down to 2 pills twice daily - Watch for increased side effect - get HRCT next few weeks - get ECHO next few weeks -Continue pulm rehabilitation - Oxygen  with exertion - PFT at [facility] per your choice - For anxiety consider meditation via apps such [facility] [employer] or [employer] [employer]  Follow-up - 3 months 30-minute visit   "

## 2024-07-13 ENCOUNTER — Ambulatory Visit: Admitting: Internal Medicine

## 2024-07-13 ENCOUNTER — Encounter: Payer: Self-pay | Admitting: Internal Medicine

## 2024-07-13 ENCOUNTER — Other Ambulatory Visit (HOSPITAL_COMMUNITY): Payer: Self-pay

## 2024-07-13 VITALS — BP 122/64 | HR 83 | Ht 69.0 in | Wt 162.0 lb

## 2024-07-13 DIAGNOSIS — J84112 Idiopathic pulmonary fibrosis: Secondary | ICD-10-CM | POA: Diagnosis not present

## 2024-07-13 DIAGNOSIS — Z5181 Encounter for therapeutic drug level monitoring: Secondary | ICD-10-CM | POA: Diagnosis not present

## 2024-07-13 NOTE — Telephone Encounter (Signed)
 Received social security statement from pt. Uploaded to [employer] portal, will update when we receive a response.

## 2024-07-13 NOTE — Telephone Encounter (Signed)
 Per test claim copay for 30 day supply of [name]  is $2100. Submitted grant application to [employer] but they are requiring proof of income and SSN. Called pt and advised him to send me a copy of his social security statement. Per pt he has about a week supply on hand still. Will await receipt of documents from pt.

## 2024-07-19 ENCOUNTER — Other Ambulatory Visit (HOSPITAL_COMMUNITY): Payer: Self-pay

## 2024-07-25 ENCOUNTER — Ambulatory Visit (INDEPENDENT_AMBULATORY_CARE_PROVIDER_SITE_OTHER)

## 2024-07-25 ENCOUNTER — Ambulatory Visit: Payer: Self-pay | Admitting: Internal Medicine

## 2024-07-25 ENCOUNTER — Telehealth: Payer: Self-pay | Admitting: Internal Medicine

## 2024-07-25 DIAGNOSIS — J84112 Idiopathic pulmonary fibrosis: Secondary | ICD-10-CM

## 2024-07-25 DIAGNOSIS — Z5181 Encounter for therapeutic drug level monitoring: Secondary | ICD-10-CM | POA: Diagnosis not present

## 2024-07-25 DIAGNOSIS — R0609 Other forms of dyspnea: Secondary | ICD-10-CM

## 2024-07-25 LAB — ECHOCARDIOGRAM COMPLETE
AR max vel: 1.71 cm2
AV Area VTI: 1.82 cm2
AV Mean grad: 9.5 mmHg
AV Peak grad: 17.1 mmHg
Ao pk vel: 2.07 m/s
Area-P 1/2: 3.77 cm2
S' Lateral: 2.54 cm

## 2024-07-25 NOTE — Progress Notes (Signed)
 There is now mild aortic stenosis.  There is known calcification of the aortic valve.  There is also calcification of the mitral valve.  In addition the ejection fraction might be 5% less than before.  This could just be a variation or it could be a real thing.  Recommend he discuss these findings with Dr. Ezra Shuck

## 2024-07-25 NOTE — Telephone Encounter (Signed)
 Echo show slight reduction in EF by 5%.  I released results to Kenneth [name] but he texted me stating last 6 days he is more dyspenic and moving room to room he is desaturaing to 86% or so. He is on esbriet  and jascaud  Not on DOAC On Baby Aspirin   Plan  - go through with HRCT 07/28/24 already schedule  - have him come in 07/26/24 and do blood work cbc, cmet, bnp, d-dimer - based on results might ask Dr Rolan for opinion

## 2024-07-26 ENCOUNTER — Ambulatory Visit: Payer: Self-pay | Admitting: Internal Medicine

## 2024-07-26 ENCOUNTER — Other Ambulatory Visit

## 2024-07-26 DIAGNOSIS — R0609 Other forms of dyspnea: Secondary | ICD-10-CM | POA: Diagnosis not present

## 2024-07-26 DIAGNOSIS — J84112 Idiopathic pulmonary fibrosis: Secondary | ICD-10-CM

## 2024-07-26 LAB — CBC WITH DIFFERENTIAL/PLATELET
Basophils Absolute: 0 K/uL (ref 0.0–0.1)
Basophils Relative: 0.2 % (ref 0.0–3.0)
Eosinophils Absolute: 0.3 K/uL (ref 0.0–0.7)
Eosinophils Relative: 5.7 % — ABNORMAL HIGH (ref 0.0–5.0)
HCT: 37.6 % — ABNORMAL LOW (ref 39.0–52.0)
Hemoglobin: 12.9 g/dL — ABNORMAL LOW (ref 13.0–17.0)
Lymphocytes Relative: 14.1 % (ref 12.0–46.0)
Lymphs Abs: 0.9 K/uL (ref 0.7–4.0)
MCHC: 34.3 g/dL (ref 30.0–36.0)
MCV: 90.1 fl (ref 78.0–100.0)
Monocytes Absolute: 0.6 K/uL (ref 0.1–1.0)
Monocytes Relative: 9.7 % (ref 3.0–12.0)
Neutro Abs: 4.3 K/uL (ref 1.4–7.7)
Neutrophils Relative %: 70.3 % (ref 43.0–77.0)
Platelets: 230 K/uL (ref 150.0–400.0)
RBC: 4.17 Mil/uL — ABNORMAL LOW (ref 4.22–5.81)
RDW: 13.6 % (ref 11.5–15.5)
WBC: 6.1 K/uL (ref 4.0–10.5)

## 2024-07-26 LAB — COMPREHENSIVE METABOLIC PANEL WITH GFR
ALT: 17 U/L (ref 3–53)
AST: 22 U/L (ref 5–37)
Albumin: 4.3 g/dL (ref 3.5–5.2)
Alkaline Phosphatase: 54 U/L (ref 39–117)
BUN: 11 mg/dL (ref 6–23)
CO2: 34 meq/L — ABNORMAL HIGH (ref 19–32)
Calcium: 8.8 mg/dL (ref 8.4–10.5)
Chloride: 94 meq/L — ABNORMAL LOW (ref 96–112)
Creatinine, Ser: 0.65 mg/dL (ref 0.40–1.50)
GFR: 87.61 mL/min
Glucose, Bld: 128 mg/dL — ABNORMAL HIGH (ref 70–99)
Potassium: 4.9 meq/L (ref 3.5–5.1)
Sodium: 131 meq/L — ABNORMAL LOW (ref 135–145)
Total Bilirubin: 0.4 mg/dL (ref 0.2–1.2)
Total Protein: 7.2 g/dL (ref 6.0–8.3)

## 2024-07-26 LAB — BRAIN NATRIURETIC PEPTIDE: Pro B Natriuretic peptide (BNP): 80 pg/mL (ref 1.0–100.0)

## 2024-07-26 LAB — D-DIMER, QUANTITATIVE: D-Dimer, Quant: 0.86 ug{FEU}/mL — ABNORMAL HIGH

## 2024-07-26 NOTE — Progress Notes (Signed)
 Basic labs ok. Sodium chronically on lower side and can explain some fatigue etc., BNP for heart filure is normal. D-dimer still pending. Anemia better

## 2024-07-26 NOTE — Telephone Encounter (Signed)
 I called and spoke with patient, advised of information/recommendations per Dr. Geronimo.  He stated that Dr. Geronimo texted him this morning regarding this information.  He saw Dr. Shayne at [facility] and had some lab work drawn there.  We are unable to see their labs in care everywhere and could not see what was ordered in his note.  Advised I would call them and see what was ordered so we do not duplicate labs.  I scheduled him to have labs drawn this am as he was on his way.  I called [facility] and had to leave a VM for the MA.  I left my direct number to return my call to find out what labs they drew at [facility].  Will await a return call.

## 2024-07-26 NOTE — Telephone Encounter (Signed)
 I received a call back from [place] at [facility], she states they did not draw any labs at his visit, his appointment for labs is 07/27/24.  She said we can just go ahead and draw our labs and then just fax the results to them to [phone].  I advised we would fax them when they result.  Nothing further needed.

## 2024-07-26 NOTE — Progress Notes (Signed)
 D-dimre 0.86 but you have been range bound here. I will wait to see how the HRCT shows up tomorrow and go from there in terms of PE thoughts. And might consider rechecking this d-dimer in a week OR maybe even getting doppler. If things are getting worse faster please do let m know or go to ER

## 2024-07-26 NOTE — Telephone Encounter (Signed)
 Spoke with the pt and notified of response per MR  He verbalized understanding and he states someone had just called him with same info He is on the way in for labs now  Nothing further needed

## 2024-07-27 NOTE — Telephone Encounter (Signed)
 I have faxed a copy of the labs from 07/26/24 to [facility] at the number that was provided.

## 2024-07-28 ENCOUNTER — Ambulatory Visit (HOSPITAL_BASED_OUTPATIENT_CLINIC_OR_DEPARTMENT_OTHER)
Admission: RE | Admit: 2024-07-28 | Discharge: 2024-07-28 | Disposition: A | Source: Ambulatory Visit | Attending: Internal Medicine | Admitting: Internal Medicine

## 2024-07-28 DIAGNOSIS — J84112 Idiopathic pulmonary fibrosis: Secondary | ICD-10-CM | POA: Insufficient documentation

## 2024-07-28 DIAGNOSIS — Z5181 Encounter for therapeutic drug level monitoring: Secondary | ICD-10-CM | POA: Insufficient documentation

## 2024-08-11 ENCOUNTER — Ambulatory Visit: Admitting: Internal Medicine

## 2024-11-02 ENCOUNTER — Ambulatory Visit: Admitting: Internal Medicine

## 2024-11-06 ENCOUNTER — Encounter (HOSPITAL_COMMUNITY)

## 2024-11-23 ENCOUNTER — Inpatient Hospital Stay

## 2024-11-23 ENCOUNTER — Inpatient Hospital Stay: Admitting: Oncology
